# Patient Record
Sex: Female | Born: 1950 | Race: White | Hispanic: No | Smoking: Never smoker
Health system: Southern US, Community
[De-identification: ages and names within clinical notes are randomized; demographics above are authoritative.]

## PROBLEM LIST (undated history)

## (undated) DIAGNOSIS — K219 Gastro-esophageal reflux disease without esophagitis: Secondary | ICD-10-CM

## (undated) DIAGNOSIS — Z8489 Family history of other specified conditions: Secondary | ICD-10-CM

## (undated) DIAGNOSIS — Q211 Atrial septal defect, unspecified: Secondary | ICD-10-CM

## (undated) DIAGNOSIS — K7460 Unspecified cirrhosis of liver: Secondary | ICD-10-CM

## (undated) DIAGNOSIS — F32A Depression, unspecified: Secondary | ICD-10-CM

## (undated) DIAGNOSIS — I10 Essential (primary) hypertension: Secondary | ICD-10-CM

## (undated) DIAGNOSIS — G629 Polyneuropathy, unspecified: Secondary | ICD-10-CM

## (undated) DIAGNOSIS — F329 Major depressive disorder, single episode, unspecified: Secondary | ICD-10-CM

## (undated) DIAGNOSIS — E039 Hypothyroidism, unspecified: Secondary | ICD-10-CM

## (undated) DIAGNOSIS — Z960 Presence of urogenital implants: Secondary | ICD-10-CM

## (undated) DIAGNOSIS — R5383 Other fatigue: Secondary | ICD-10-CM

## (undated) DIAGNOSIS — E119 Type 2 diabetes mellitus without complications: Secondary | ICD-10-CM

## (undated) DIAGNOSIS — R51 Headache: Secondary | ICD-10-CM

## (undated) DIAGNOSIS — D649 Anemia, unspecified: Secondary | ICD-10-CM

## (undated) DIAGNOSIS — E785 Hyperlipidemia, unspecified: Secondary | ICD-10-CM

## (undated) DIAGNOSIS — R748 Abnormal levels of other serum enzymes: Secondary | ICD-10-CM

## (undated) DIAGNOSIS — H3500 Unspecified background retinopathy: Secondary | ICD-10-CM

## (undated) DIAGNOSIS — M8580 Other specified disorders of bone density and structure, unspecified site: Secondary | ICD-10-CM

## (undated) DIAGNOSIS — N189 Chronic kidney disease, unspecified: Secondary | ICD-10-CM

## (undated) HISTORY — DX: Unspecified background retinopathy: H35.00

## (undated) HISTORY — DX: Type 2 diabetes mellitus without complications: E11.9

## (undated) HISTORY — DX: Polyneuropathy, unspecified: G62.9

## (undated) HISTORY — DX: Anemia, unspecified: D64.9

## (undated) HISTORY — DX: Depression, unspecified: F32.A

## (undated) HISTORY — PX: SPINAL FUSION: SHX223

## (undated) HISTORY — DX: Hyperlipidemia, unspecified: E78.5

## (undated) HISTORY — DX: Other fatigue: R53.83

## (undated) HISTORY — DX: Gastro-esophageal reflux disease without esophagitis: K21.9

## (undated) HISTORY — PX: REFRACTIVE SURGERY: SHX103

## (undated) HISTORY — PX: APPENDECTOMY: SHX54

## (undated) HISTORY — PX: CATARACT EXTRACTION: SUR2

## (undated) HISTORY — PX: TONSILLECTOMY: SUR1361

## (undated) HISTORY — DX: Hypothyroidism, unspecified: E03.9

## (undated) HISTORY — DX: Major depressive disorder, single episode, unspecified: F32.9

---

## 1985-03-30 HISTORY — PX: COMBINED HYSTERECTOMY VAGINAL / OOPHORECTOMY / A&P REPAIR: SUR294

## 2000-02-20 ENCOUNTER — Ambulatory Visit (HOSPITAL_COMMUNITY): Admission: RE | Admit: 2000-02-20 | Discharge: 2000-02-20 | Payer: Self-pay | Admitting: *Deleted

## 2000-10-21 ENCOUNTER — Other Ambulatory Visit: Admission: RE | Admit: 2000-10-21 | Discharge: 2000-10-21 | Payer: Self-pay | Admitting: Internal Medicine

## 2004-03-14 ENCOUNTER — Ambulatory Visit (HOSPITAL_COMMUNITY): Admission: RE | Admit: 2004-03-14 | Discharge: 2004-03-14 | Payer: Self-pay

## 2004-03-15 ENCOUNTER — Inpatient Hospital Stay (HOSPITAL_COMMUNITY): Admission: EM | Admit: 2004-03-15 | Discharge: 2004-03-23 | Payer: Self-pay | Admitting: Emergency Medicine

## 2004-03-30 HISTORY — PX: CERVICAL SPINE SURGERY: SHX589

## 2004-04-18 ENCOUNTER — Ambulatory Visit: Admission: RE | Admit: 2004-04-18 | Discharge: 2004-04-18 | Payer: Self-pay | Admitting: Neurosurgery

## 2004-05-13 ENCOUNTER — Inpatient Hospital Stay (HOSPITAL_COMMUNITY): Admission: RE | Admit: 2004-05-13 | Discharge: 2004-05-15 | Payer: Self-pay | Admitting: Neurosurgery

## 2005-11-24 ENCOUNTER — Emergency Department (HOSPITAL_COMMUNITY): Admission: EM | Admit: 2005-11-24 | Discharge: 2005-11-24 | Payer: Self-pay | Admitting: Emergency Medicine

## 2008-02-21 ENCOUNTER — Ambulatory Visit: Payer: Self-pay | Admitting: Internal Medicine

## 2008-02-21 DIAGNOSIS — F3289 Other specified depressive episodes: Secondary | ICD-10-CM | POA: Insufficient documentation

## 2008-02-21 DIAGNOSIS — K219 Gastro-esophageal reflux disease without esophagitis: Secondary | ICD-10-CM | POA: Insufficient documentation

## 2008-02-21 DIAGNOSIS — G609 Hereditary and idiopathic neuropathy, unspecified: Secondary | ICD-10-CM | POA: Insufficient documentation

## 2008-02-21 DIAGNOSIS — F329 Major depressive disorder, single episode, unspecified: Secondary | ICD-10-CM | POA: Insufficient documentation

## 2008-02-21 DIAGNOSIS — Z961 Presence of intraocular lens: Secondary | ICD-10-CM | POA: Insufficient documentation

## 2008-02-22 ENCOUNTER — Encounter (INDEPENDENT_AMBULATORY_CARE_PROVIDER_SITE_OTHER): Payer: Self-pay | Admitting: Internal Medicine

## 2008-02-27 ENCOUNTER — Encounter (INDEPENDENT_AMBULATORY_CARE_PROVIDER_SITE_OTHER): Payer: Self-pay | Admitting: Internal Medicine

## 2008-02-28 ENCOUNTER — Telehealth (INDEPENDENT_AMBULATORY_CARE_PROVIDER_SITE_OTHER): Payer: Self-pay | Admitting: *Deleted

## 2008-03-14 ENCOUNTER — Encounter (INDEPENDENT_AMBULATORY_CARE_PROVIDER_SITE_OTHER): Payer: Self-pay | Admitting: Internal Medicine

## 2008-03-20 ENCOUNTER — Ambulatory Visit: Payer: Self-pay | Admitting: Internal Medicine

## 2008-03-20 DIAGNOSIS — E039 Hypothyroidism, unspecified: Secondary | ICD-10-CM | POA: Insufficient documentation

## 2008-03-20 DIAGNOSIS — N182 Chronic kidney disease, stage 2 (mild): Secondary | ICD-10-CM | POA: Insufficient documentation

## 2008-03-20 DIAGNOSIS — E785 Hyperlipidemia, unspecified: Secondary | ICD-10-CM | POA: Insufficient documentation

## 2008-05-01 ENCOUNTER — Ambulatory Visit: Payer: Self-pay | Admitting: Internal Medicine

## 2008-05-01 LAB — CONVERTED CEMR LAB
Blood Glucose, Fingerstick: 137
Hgb A1c MFr Bld: 10.5 %

## 2008-06-20 ENCOUNTER — Ambulatory Visit: Payer: Self-pay | Admitting: Internal Medicine

## 2008-06-26 ENCOUNTER — Encounter (INDEPENDENT_AMBULATORY_CARE_PROVIDER_SITE_OTHER): Payer: Self-pay | Admitting: Internal Medicine

## 2008-07-18 ENCOUNTER — Encounter (INDEPENDENT_AMBULATORY_CARE_PROVIDER_SITE_OTHER): Payer: Self-pay | Admitting: Internal Medicine

## 2008-07-19 ENCOUNTER — Encounter (INDEPENDENT_AMBULATORY_CARE_PROVIDER_SITE_OTHER): Payer: Self-pay | Admitting: Internal Medicine

## 2008-07-19 LAB — CONVERTED CEMR LAB
Ferritin: 44 ng/mL (ref 10–291)
Iron: 73 ug/dL (ref 42–145)
Saturation Ratios: 22 % (ref 20–55)
TIBC: 330 ug/dL (ref 250–470)
UIBC: 257 ug/dL
Vitamin B-12: 325 pg/mL (ref 211–911)

## 2008-07-23 LAB — CONVERTED CEMR LAB
ALT: 16 units/L (ref 0–35)
AST: 21 units/L (ref 0–37)
Albumin: 3.6 g/dL (ref 3.5–5.2)
Alkaline Phosphatase: 85 units/L (ref 39–117)
BUN: 44 mg/dL — ABNORMAL HIGH (ref 6–23)
Basophils Absolute: 0 10*3/uL (ref 0.0–0.1)
Basophils Relative: 1 % (ref 0–1)
CO2: 20 meq/L (ref 19–32)
Calcium: 8.9 mg/dL (ref 8.4–10.5)
Chloride: 109 meq/L (ref 96–112)
Cholesterol: 218 mg/dL — ABNORMAL HIGH (ref 0–200)
Creatinine, Ser: 2.13 mg/dL — ABNORMAL HIGH (ref 0.40–1.20)
Eosinophils Absolute: 0.2 10*3/uL (ref 0.0–0.7)
Eosinophils Relative: 5 % (ref 0–5)
Free T4: 1.33 ng/dL (ref 0.80–1.80)
Glucose, Bld: 102 mg/dL — ABNORMAL HIGH (ref 70–99)
HCT: 32.1 % — ABNORMAL LOW (ref 36.0–46.0)
HDL: 28 mg/dL — ABNORMAL LOW (ref >39)
Hemoglobin: 10 g/dL — ABNORMAL LOW (ref 12.0–15.0)
LDL Cholesterol: 141 mg/dL — ABNORMAL HIGH (ref 0–99)
Lymphocytes Relative: 22 % (ref 12–46)
Lymphs Abs: 1.1 10*3/uL (ref 0.7–4.0)
MCHC: 31.2 g/dL (ref 30.0–36.0)
MCV: 95.3 fL (ref 78.0–100.0)
Monocytes Absolute: 0.3 10*3/uL (ref 0.1–1.0)
Monocytes Relative: 7 % (ref 3–12)
Neutro Abs: 3.3 10*3/uL (ref 1.7–7.7)
Neutrophils Relative %: 66 % (ref 43–77)
Platelets: 176 10*3/uL (ref 150–400)
Potassium: 4.9 meq/L (ref 3.5–5.3)
RBC: 3.37 M/uL — ABNORMAL LOW (ref 3.87–5.11)
RDW: 15.7 % — ABNORMAL HIGH (ref 11.5–15.5)
Sodium: 141 meq/L (ref 135–145)
TSH: 4.171 microintl units/mL (ref 0.350–4.500)
Total Bilirubin: 0.3 mg/dL (ref 0.3–1.2)
Total CHOL/HDL Ratio: 7.8
Total Protein: 6.7 g/dL (ref 6.0–8.3)
Triglycerides: 244 mg/dL — ABNORMAL HIGH (ref <150)
VLDL: 49 mg/dL — ABNORMAL HIGH (ref 0–40)
WBC: 5 10*3/uL (ref 4.0–10.5)

## 2008-07-31 ENCOUNTER — Ambulatory Visit: Payer: Self-pay | Admitting: Internal Medicine

## 2008-07-31 LAB — CONVERTED CEMR LAB
Blood Glucose, Fingerstick: 143
Hgb A1c MFr Bld: 6.9 %

## 2008-08-01 ENCOUNTER — Encounter (INDEPENDENT_AMBULATORY_CARE_PROVIDER_SITE_OTHER): Payer: Self-pay | Admitting: Internal Medicine

## 2008-08-01 LAB — CONVERTED CEMR LAB
Albumin: 3.6 g/dL (ref 3.5–5.2)
BUN: 53 mg/dL — ABNORMAL HIGH (ref 6–23)
CO2: 20 meq/L (ref 19–32)
Calcium: 8.7 mg/dL (ref 8.4–10.5)
Chloride: 108 meq/L (ref 96–112)
Creatinine, Ser: 2.11 mg/dL — ABNORMAL HIGH (ref 0.40–1.20)
Creatinine, Urine: 103.7 mg/dL
Glucose, Bld: 98 mg/dL (ref 70–99)
Microalb Creat Ratio: 4.8 mg/g (ref 0.0–30.0)
Microalb, Ur: 0.5 mg/dL (ref 0.00–1.89)
Phosphorus: 3.8 mg/dL (ref 2.3–4.6)
Potassium: 5.3 meq/L (ref 3.5–5.3)
Sodium: 139 meq/L (ref 135–145)

## 2008-08-06 ENCOUNTER — Encounter (INDEPENDENT_AMBULATORY_CARE_PROVIDER_SITE_OTHER): Payer: Self-pay | Admitting: Internal Medicine

## 2008-11-13 ENCOUNTER — Ambulatory Visit: Payer: Self-pay | Admitting: Internal Medicine

## 2008-11-13 LAB — CONVERTED CEMR LAB: Hgb A1c MFr Bld: 5.8 %

## 2009-01-20 ENCOUNTER — Observation Stay (HOSPITAL_COMMUNITY): Admission: EM | Admit: 2009-01-20 | Discharge: 2009-01-21 | Payer: Self-pay | Admitting: Emergency Medicine

## 2009-01-20 ENCOUNTER — Ambulatory Visit: Payer: Self-pay | Admitting: Cardiology

## 2009-01-21 ENCOUNTER — Encounter (INDEPENDENT_AMBULATORY_CARE_PROVIDER_SITE_OTHER): Payer: Self-pay | Admitting: Internal Medicine

## 2009-06-18 ENCOUNTER — Encounter (INDEPENDENT_AMBULATORY_CARE_PROVIDER_SITE_OTHER): Payer: Self-pay | Admitting: *Deleted

## 2009-06-18 LAB — CONVERTED CEMR LAB
ALT: 15 units/L
AST: 15 units/L
Albumin: 3.7 g/dL
Alkaline Phosphatase: 132 units/L
BUN: 31 mg/dL
CO2: 22 meq/L
Calcium: 8.7 mg/dL
Chloride: 110 meq/L
Creatinine, Ser: 1.71 mg/dL
Glucose, Bld: 102 mg/dL
Potassium: 5.5 meq/L
Sodium: 139 meq/L
Total Protein: 6.6 g/dL

## 2010-01-20 ENCOUNTER — Encounter (INDEPENDENT_AMBULATORY_CARE_PROVIDER_SITE_OTHER): Payer: Self-pay | Admitting: *Deleted

## 2010-01-20 LAB — CONVERTED CEMR LAB
BUN: 31 mg/dL
CO2: 18 meq/L
Calcium: 9 mg/dL
Chloride: 107 meq/L
Creatinine, Ser: 1.95 mg/dL
GFR calc non Af Amer: 28 mL/min
Glomerular Filtration Rate, Af Am: 32 mL/min/{1.73_m2}
Glucose, Bld: 151 mg/dL
Potassium: 5.2 meq/L
Sodium: 140 meq/L

## 2010-02-04 ENCOUNTER — Encounter (INDEPENDENT_AMBULATORY_CARE_PROVIDER_SITE_OTHER): Payer: Self-pay | Admitting: *Deleted

## 2010-02-05 ENCOUNTER — Encounter (INDEPENDENT_AMBULATORY_CARE_PROVIDER_SITE_OTHER): Payer: Self-pay | Admitting: *Deleted

## 2010-02-05 ENCOUNTER — Ambulatory Visit: Payer: Self-pay | Admitting: Cardiology

## 2010-02-05 DIAGNOSIS — E1122 Type 2 diabetes mellitus with diabetic chronic kidney disease: Secondary | ICD-10-CM | POA: Insufficient documentation

## 2010-02-05 DIAGNOSIS — R079 Chest pain, unspecified: Secondary | ICD-10-CM | POA: Insufficient documentation

## 2010-02-06 ENCOUNTER — Encounter: Payer: Self-pay | Admitting: Cardiology

## 2010-02-12 ENCOUNTER — Ambulatory Visit: Payer: Self-pay | Admitting: Cardiology

## 2010-02-12 ENCOUNTER — Encounter: Payer: Self-pay | Admitting: Cardiology

## 2010-02-12 ENCOUNTER — Encounter (HOSPITAL_COMMUNITY)
Admission: RE | Admit: 2010-02-12 | Discharge: 2010-03-14 | Payer: Self-pay | Source: Home / Self Care | Attending: Cardiology | Admitting: Cardiology

## 2010-02-12 LAB — CONVERTED CEMR LAB
ALT: 22 units/L (ref 0–35)
AST: 22 units/L (ref 0–37)
Albumin: 3.9 g/dL (ref 3.5–5.2)
Alkaline Phosphatase: 175 units/L — ABNORMAL HIGH (ref 39–117)
BUN: 25 mg/dL — ABNORMAL HIGH (ref 6–23)
Basophils Absolute: 0 10*3/uL (ref 0.0–0.1)
Basophils Relative: 0 % (ref 0–1)
CO2: 22 meq/L (ref 19–32)
Calcium: 8.4 mg/dL (ref 8.4–10.5)
Chloride: 106 meq/L (ref 96–112)
Cholesterol: 154 mg/dL (ref 0–200)
Creatinine, Ser: 1.81 mg/dL — ABNORMAL HIGH (ref 0.40–1.20)
Eosinophils Absolute: 0.3 10*3/uL (ref 0.0–0.7)
Eosinophils Relative: 3 % (ref 0–5)
Glucose, Bld: 183 mg/dL — ABNORMAL HIGH (ref 70–99)
HCT: 38.7 % (ref 36.0–46.0)
HDL: 31 mg/dL — ABNORMAL LOW (ref >39)
Hemoglobin: 12.3 g/dL (ref 12.0–15.0)
LDL Cholesterol: 91 mg/dL (ref 0–99)
Lymphocytes Relative: 16 % (ref 12–46)
Lymphs Abs: 1.4 10*3/uL (ref 0.7–4.0)
MCHC: 31.8 g/dL (ref 30.0–36.0)
MCV: 92.6 fL (ref 78.0–100.0)
Monocytes Absolute: 0.5 10*3/uL (ref 0.1–1.0)
Monocytes Relative: 6 % (ref 3–12)
Neutro Abs: 6.6 10*3/uL (ref 1.7–7.7)
Neutrophils Relative %: 75 % (ref 43–77)
Platelets: 170 10*3/uL (ref 150–400)
Potassium: 4.3 meq/L (ref 3.5–5.3)
RBC: 4.18 M/uL (ref 3.87–5.11)
RDW: 13.8 % (ref 11.5–15.5)
Sodium: 139 meq/L (ref 135–145)
Total Bilirubin: 0.4 mg/dL (ref 0.3–1.2)
Total CHOL/HDL Ratio: 5
Total Protein: 6.9 g/dL (ref 6.0–8.3)
Triglycerides: 161 mg/dL — ABNORMAL HIGH (ref <150)
VLDL: 32 mg/dL (ref 0–40)
WBC: 8.9 10*3/uL (ref 4.0–10.5)

## 2010-02-25 ENCOUNTER — Ambulatory Visit: Payer: Self-pay | Admitting: Cardiology

## 2010-04-29 NOTE — Assessment & Plan Note (Signed)
Summary: F/U TESTS TO BE DONE 02/12/10/TG   Visit Type:  Follow-up Referring Provider:  Dr. Talmage Nap (Endocrine) Primary Provider:  None   History of Present Illness: [name] is a  60 y/o obese CF with complaints of chest discomfort and fatigue who was seen in the office by Dr. Dietrich Pates for cardiac evaluation on 02/05/2010.  She also has a history of diabetes and hypertension, along with hypothyroidism.  He assessed her and planned stress test and echocardiogram for diagnostic/prognostic purposes. He also increased her ramapril to 10mg  daily.  She is here to discuss the results.  She is continuing to have complaints of fatigue, and now cold symptoms.  No recurrence of chest pain.   Current Medications (verified): 1)  Glipizide 5 Mg Tabs (Glipizide) .... Take 1 Tab Two Times A Day 2)  Ramipril 10 Mg Caps (Ramipril) .... Take One Capsule By Mouth Daily 3)  Synthroid 100 Mcg Tabs (Levothyroxine Sodium) .... Take 1 Tab Daily 4)  Onglyza 2.5 Mg Tabs (Saxagliptin Hcl) .[name].. 1 By Mouth Once Daily 5)  Accu-Chek Multiclix Lancets  Misc (Lancets) .... Fsbs Once Daily 6)  Tylenol Extra Strength 500 Mg Tabs (Acetaminophen) .... As Needed 7)  Colace 100 Mg Caps (Docusate Sodium) .... Take As Needed  Allergies (verified): 1)  * Statins  Comments:  Nurse/Medical Assistant: patient and i reviewed med list from previous ov and stated that all meds are correct  Review of Systems       Fatigue, cold symptoms  All other systems have been reviewed and are negative unless stated above.   Vital Signs:  Patient profile:   60 year old female Weight:      255 pounds BMI:     41.00 Pulse rate:   98 / minute BP sitting:   143 / 82  (right arm)  Vitals Entered By: Dreama Saa, CNA (February 25, 2010 2:59 PM)  Physical Exam  General:  Well developed, well nourished, in no acute distress. Lungs:  Clear bilaterally to auscultation and percussion. Heart:  Non-displaced PMI, chest non-tender; regular  rate and rhythm, S1, S2 without murmurs, rubs or gallops. Carotid upstroke normal, no bruit. Normal abdominal aortic size, no bruits. Femorals normal pulses, no bruits. Pedals normal pulses. No edema, no varicosities. Psych:  Normal affect.   Impression & Recommendations:  Problem # 1:  CHEST PAIN (ICD-786.50) Review of stress myoview demonstrated negative for ischemia but impaired exercise tolerance and hypertensive response to exercise.  There was minor anterolater defect probably representing breast attenuation, but a small degree of scarring in this region cannot be unequivocally excluded. EF 51 %. No EKG abnormalities.  She continues complaints of fatigue.  I have asked about sleep apnea and snoring. 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  She states that she has been checked for sleep apnea although does not remember having a sleep study.  This may be considered as part of evaluation if necessary by primary care. She is given a list of primary care physicians to chose from. Will see on as needed basis should she have recurrent symptoms. At which time we will plan cardiac cath for definitive evaluation for CAD. updated medication list for this problem includes:    Ramipril 10 Mg Caps (Ramipril) .[name]... Take one capsule by mouth daily  Problem # 2:  HYPOTHYROIDISM (ICD-244.9) She has recently been increased on her dose of synthroid by Dr. Loney Hering.   Her updated medication list  for this problem includes:    Synthroid 100 Mcg Tabs (Levothyroxine sodium) .[name]... Take 1 tab daily

## 2010-04-29 NOTE — Letter (Signed)
Summary: [facility], general practice at [place]. [address] [place] [address]   Phone: [phone]  Fax: [phone]     February 05, 2010 MRN: [id number]   [name] [address] [place], [place]  [address]      YOUR LAB WORK IS DUE   February 10, 2010  Please go to [facility], located across the street from [facility] on the second floor.  Hours are Monday - Friday 7am until 7:30pm         Saturday 8am until 12noon    __  DO NOT EAT OR DRINK AFTER MIDNIGHT EVENING PRIOR TO LABWORK

## 2010-04-29 NOTE — Assessment & Plan Note (Signed)
Summary: np6/ CHESTPAIN. PT HAS [facility]/ GD   Visit Type:  Initial Consult Referring Provider:  Dr. Chalmers Cater (Endocrine) Primary Provider:  None   History of Present Illness: [name] is seen in the office today for an initial visit at the kind request of Dr. Chalmers Cater as the result of chest discomfort and fatigue.  [name] has been treated for hypothyroidism and is now apparently euthyroid or nearly euthyroid.  Nonetheless, she continues to complain of exercise intolerance and severe fatigue.    She has intermittent right chest heaviness, typically when she is driving an automobile.  There is no chest wall tenderness, but there may be a pleuritic component.  She denies associated nausea, diaphoresis and dyspnea.  Symptoms are generally brief.  She has not identified anything she can do to exacerbate or ameliorate her discomfort and wonders whether stress could be the problem;  however, she denies anxiety or any particular stressful events in her life.     She has occasional "heartburn", epigastric and parasternal discomfort that she treats effectively with Tums.  Cardiac catheterization in 2001 was entirely normal.  This followed an abnormal treadmill stress test.  An echocardiogram approximately a year ago raised the question of an ASD or PFO.  Current Medications (verified): 1)  Glipizide 5 Mg Tabs (Glipizide) .... Take 1 Tab Daily 2)  Ramipril 10 Mg Caps (Ramipril) .... Take One Capsule By Mouth Daily 3)  Synthroid 100 Mcg Tabs (Levothyroxine Sodium) .... Take 1 Tab Daily 4)  Onglyza 2.5 Mg Tabs (Saxagliptin Hcl) .[name].. 1 By Mouth Once Daily 5)  Accu-Chek Multiclix Lancets  Misc (Lancets) .... Fsbs Once Daily 6)  Tylenol Extra Strength 500 Mg Tabs (Acetaminophen) .... As Needed 7)  Colace 100 Mg Caps (Docusate Sodium) .... Take As Needed  Allergies (verified): 1)  * Statins  Comments:  Nurse/Medical Assistant: patient was taken off actos 2 weeks ago also needs a primary care  docter  Past History:  Family History: Last updated: Mar 05, 2010 father-deceased-70's-metastatic carcinoma of the lung mother-deceased-70's-COPD Siblings: 1 sister-53-DM; second sister is healthy Children-2 daughters and a son, who are healthy  Social History: Last updated: 03-05-2010 Divorced lives alone Employment-[employer] Never Smoked Alcohol use-no Drug use-no  Past Medical History: Chest pain and fatigue Diabetes mellitus, type II with retinopathy, peripheral neuropathy and nephropathy Hypothyroidism Hyperlipidemia Gastroesophageal reflux disease Cataracts Gout  Anemia Depression  Past Surgical History: Hysterectomy and a unilateral oophorectomy-1987-h/o uterine prolapse Cervical spine surgery-2006 Colonoscopy-never  EKG  Procedure date:  March 05, 2010  Findings:      Normal sinus rhythm Intermittent incomplete right bundle branch block Low voltage in the chest leads Minor nonspecific T wave abnormality No previous tracing for comparison.   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  All other systems reviewed and are negative.  Vital Signs:  Patient profile:   60 year old female Weight:      269 pounds BMI:     43.25 Pulse rate:   80 / minute BP sitting:   132 / 77  (right arm)  Vitals  Entered By: Doretha Sou, CNA (February 05, 2010 1:26 PM)  Physical Exam  General:  Overweight; well-developed; no acute  distress: HEENT-[place]/AT; PERRL; EOM intact; conjunctiva and lids nl:  Neck-No JVD; no carotid bruits: Endocrine-No thyromegaly: Lungs-No tachypnea, clear without rales, rhonchi or wheezes: CV-normal PMI; normal S1 and S2; S4 present; modest systolic ejection murmur Abdomen-BS normal; soft and non-tender without masses or organomegaly: MS-No deformities, cyanosis or clubbing: Neurologic-Nl cranial nerves; symmetric strength and tone: Skin- Warm, pallor, no sig. lesions: Extremities-Nl distal pulses; no edema    Impression & Recommendations:  Problem # 1:  CHEST PAIN (ICD-786.50) Chest discomfort is atypical, and cardiovascular risk factors are not impressive.  We will proceed with a stress nuclear cardiac study.  Recent echocardiogram was interpreted as possibly indicating the presence of an ASD.  This appears unlikely without other findings, such as right atrial or right ventricular enlargement.  Nonetheless, we will proceed with a contrast echocardiogram in an attempt to clarify this issue.  Fatigue is nonspecific and would most appropriately be evaluated by her new primary care physician, once one is selected.  Problem # 2:  HYPERTENSION (ICD-401.1) Blood pressure control is marginal for a patient with diabetes.  Moreover, she has significantly impaired renal function and will likely benefit from treatment with an adequate dose of ACE inhibitor or ARB.  Ramipril will be increased to 10 mg q.d. and renal function followed.  Other Orders: Nuclear Stress Test (Nuc Stress Test) 2-D Echocardiogram (2D Echo) Future Orders: T-Lipid Profile ([id number]) ... 02/10/2010 T-CBC w/Diff ([id number]) ... 02/10/2010 T-Comprehensive Metabolic Panel ([id number]) ... 02/10/2010  Patient Instructions: 1)  Your physician recommends that you schedule a follow-up appointment in: AFTER TESTING 2)  Your physician has recommended you make the following change in your medication: INCREASE ALTACE TO 10MG  DAILY 3)  Your physician has requested that you have an echocardiogram.  Echocardiography is a painless test that uses sound waves to create images of your heart. It provides your doctor with information about the size and shape of your heart and how well your heart's chambers and valves are working.  This procedure takes approximately one hour. There are no restrictions for this procedure. 4)  Your physician has requested that you have an exercise stress myoview.  For further information please visit [email or web address].  Please follow instruction sheet, as given. Prescriptions: RAMIPRIL 10 MG CAPS (RAMIPRIL) Take one capsule by mouth daily  #30 x 3   Entered by:   Tye Savoy RN   Authorized by:   Yehuda Savannah, MD, [facility]   Signed by:   Tye Savoy RN on 02/05/2010   Method used:   Electronically to        [facility] (retail)       [facility] [address]       [place], [place]  [address]       Ph: [phone]       Fax: [phone]   RxID:   [id number]

## 2010-04-29 NOTE — Miscellaneous (Signed)
Summary: labs bmp,tsh,01/20/2010  Clinical Lists Changes  Observations: Added new observation of CALCIUM: 9.0 mg/dL (01/20/2010 10:06) Added new observation of GFR AA: 32 mL/min/1.35m (01/20/2010 10:06) Added new observation of GFR: 28 mL/min (01/20/2010 10:06) Added new observation of CREATININE: 1.95 mg/dL (01/20/2010 10:06) Added new observation of BUN: 31 mg/dL (01/20/2010 10:06) Added new observation of BG RANDOM: 151 mg/dL (01/20/2010 10:06) Added new observation of CO2 PLSM/SER: 18 meq/L (01/20/2010 10:06) Added new observation of CL SERUM: 107 meq/L (01/20/2010 10:06) Added new observation of K SERUM: 5.2 meq/L (01/20/2010 10:06) Added new observation of NA: 140 meq/L (01/20/2010 10:06)

## 2010-04-29 NOTE — Miscellaneous (Signed)
Summary: labs cmp,06/18/2009  Clinical Lists Changes  Observations: Added new observation of CALCIUM: 8.7 mg/dL (04/54/0981 19:14) Added new observation of ALBUMIN: 3.7 g/dL (78/29/5621 30:86) Added new observation of PROTEIN, TOT: 6.6 g/dL (57/84/6962 95:28) Added new observation of SGPT (ALT): 15 units/L (06/18/2009 10:08) Added new observation of SGOT (AST): 15 units/L (06/18/2009 10:08) Added new observation of ALK PHOS: 132 units/L (06/18/2009 10:08) Added new observation of CREATININE: 1.71 mg/dL (41/32/4401 02:72) Added new observation of BUN: 31 mg/dL (53/66/4403 47:42) Added new observation of BG RANDOM: 102 mg/dL (59/56/3875 64:33) Added new observation of CO2 PLSM/SER: 22 meq/L (06/18/2009 10:08) Added new observation of CL SERUM: 110 meq/L (06/18/2009 10:08) Added new observation of K SERUM: 5.5 meq/L (06/18/2009 10:08) Added new observation of NA: 139 meq/L (06/18/2009 10:08)

## 2010-04-29 NOTE — Letter (Signed)
Summary: [facility] ([place])  [facility] at [place]. [address] [place] [address]   Phone: [phone]  Fax: [phone]    Nuclear Medicine 1-Day Stress Test Information Sheet  Re:     [name]   DOB:     [birth year] MRN:     [id number] Weight:  Appointment Date: Register at: Appointment Time: Referring MD:  _X__Exercise Stress  __Adenosine   __Dobutamine  __Lexiscan  __Persantine   __Thallium  Urgency: ____1 (next day)   ____2 (one week)    ____3 (PRN)  Patient will receive Follow Up call with results: Patient needs follow-up appointment:  Instructions regarding medication:  How to prepare for your stress test: 1. DO NOT eat or dring 6 hours prior to your arrival time. This includes no caffeine (coffee, tea, sodas, chocolate) if you were instructed to take your medications, drink water with it. 2. DO NOT use any tobacco products for at leaset 8 hours prior to arrival. 3. DO NOT wear dresses or any clothing that may have metal clasps or buttons. 4. Wear short sleeve shirts, loose clothing, and comfortalbe walking shoes. 5. DO NOT use lotions, oils or powder on your chest before the test. 6. The test will take approximately 3-4 hours from the time you arrive until completion. 7. To register the day of the test, go to the Short Stay entrance at [facility]. 8. If you must cancel your test, call [phone] as soon as you are aware. 9.DO NOT TAKE GLIPIZIDE AND RAMIPRIL THE MORNING BEFORE THE TEST After you arrive for test:   When you arrive at [facility], you will go to Short Stay to be registered. They will then send you to Radiology to check in. The Nuclear Medicine Tech will get you and start an IV in your arm or hand. A small amount of a radioactive tracer will then be injected into your IV. This tracer will then have to circulate for 30-45 minutes. During this time you will wait in the waiting room and you will be able to drink  something without caffeine. A series of pictures will be taken of your heart follwoing this waiting period. After the 1st set of pictures you will go to the stress lab to get ready for your stress test. During the stress test, another small amount of a radioactive tracer will be injected through your IV. When the stress test is complete, there is a short rest period while your heart rate and blood pressure will be monitored. When this monitoring period is complete you will have another set of pictrues taken. (The same as the 1st set of pictures). These pictures are taken between 15 minutes and 1 hour after the stress test. The time depends on the type of stress test you had. Your doctor will inform you of your test results within 7 days after test.    The possibilities of certain changes are possible during the test. They include abnormal blood pressure and disorders of the heart. Side effects of persantine or adenosine can include flushing, chest pain, shortness of breath, stomach tightness, headache and light-headedness. These side effects usually do not last long and are self-resolving. Every effort will be made to keep you comfortable and to minimize complications by obtaining a medical history and by close observation during the test. Emergency equipment, medications, and trained personnel are available to deal with any unusual situation which may arise.  Please notify office at least  48 hours in advance if you are unable to keep this appt.

## 2010-05-06 ENCOUNTER — Other Ambulatory Visit: Payer: Self-pay | Admitting: Internal Medicine

## 2010-05-06 DIAGNOSIS — R1011 Right upper quadrant pain: Secondary | ICD-10-CM

## 2010-05-06 DIAGNOSIS — R198 Other specified symptoms and signs involving the digestive system and abdomen: Secondary | ICD-10-CM

## 2010-05-08 ENCOUNTER — Other Ambulatory Visit: Payer: Self-pay

## 2010-05-12 ENCOUNTER — Ambulatory Visit
Admission: RE | Admit: 2010-05-12 | Discharge: 2010-05-12 | Disposition: A | Discharge status: Home / Self Care | Payer: 59 | Source: Ambulatory Visit | Attending: Internal Medicine | Admitting: Internal Medicine

## 2010-05-12 DIAGNOSIS — R1011 Right upper quadrant pain: Secondary | ICD-10-CM

## 2010-05-12 DIAGNOSIS — R198 Other specified symptoms and signs involving the digestive system and abdomen: Secondary | ICD-10-CM

## 2010-07-03 LAB — GLUCOSE, CAPILLARY
Glucose-Capillary: 146 mg/dL — ABNORMAL HIGH (ref 70–99)
Glucose-Capillary: 161 mg/dL — ABNORMAL HIGH (ref 70–99)
Glucose-Capillary: 241 mg/dL — ABNORMAL HIGH (ref 70–99)
Glucose-Capillary: 85 mg/dL (ref 70–99)
Glucose-Capillary: 90 mg/dL (ref 70–99)
Glucose-Capillary: 98 mg/dL (ref 70–99)

## 2010-07-03 LAB — URINALYSIS, ROUTINE W REFLEX MICROSCOPIC
Glucose, UA: NEGATIVE mg/dL
Hgb urine dipstick: NEGATIVE
Protein, ur: NEGATIVE mg/dL
Specific Gravity, Urine: 1.02 (ref 1.005–1.030)
pH: 6 (ref 5.0–8.0)

## 2010-07-03 LAB — COMPREHENSIVE METABOLIC PANEL
ALT: 16 U/L (ref 0–35)
AST: 22 U/L (ref 0–37)
CO2: 26 mEq/L (ref 19–32)
Calcium: 8.2 mg/dL — ABNORMAL LOW (ref 8.4–10.5)
Calcium: 8.6 mg/dL (ref 8.4–10.5)
Creatinine, Ser: 1.99 mg/dL — ABNORMAL HIGH (ref 0.4–1.2)
Creatinine, Ser: 2.03 mg/dL — ABNORMAL HIGH (ref 0.4–1.2)
GFR calc Af Amer: 31 mL/min — ABNORMAL LOW (ref >60)
GFR calc Af Amer: 31 mL/min — ABNORMAL LOW (ref >60)
GFR calc non Af Amer: 25 mL/min — ABNORMAL LOW (ref >60)
GFR calc non Af Amer: 26 mL/min — ABNORMAL LOW (ref >60)
Glucose, Bld: 82 mg/dL (ref 70–99)
Sodium: 137 mEq/L (ref 135–145)
Total Protein: 5.8 g/dL — ABNORMAL LOW (ref 6.0–8.3)

## 2010-07-03 LAB — CBC
HCT: 31.8 % — ABNORMAL LOW (ref 36.0–46.0)
Hemoglobin: 10.9 g/dL — ABNORMAL LOW (ref 12.0–15.0)
Hemoglobin: 9.5 g/dL — ABNORMAL LOW (ref 12.0–15.0)
MCHC: 34.1 g/dL (ref 30.0–36.0)
MCHC: 34.4 g/dL (ref 30.0–36.0)
MCV: 91.7 fL (ref 78.0–100.0)
MCV: 91.7 fL (ref 78.0–100.0)
Platelets: 141 10*3/uL — ABNORMAL LOW (ref 150–400)
RBC: 3.47 MIL/uL — ABNORMAL LOW (ref 3.87–5.11)
RDW: 13.7 % (ref 11.5–15.5)
RDW: 13.8 % (ref 11.5–15.5)
WBC: 10.3 10*3/uL (ref 4.0–10.5)

## 2010-07-03 LAB — POCT I-STAT, CHEM 8
Chloride: 111 mEq/L (ref 96–112)
Creatinine, Ser: 1.8 mg/dL — ABNORMAL HIGH (ref 0.4–1.2)
Glucose, Bld: 232 mg/dL — ABNORMAL HIGH (ref 70–99)
Potassium: 5 mEq/L (ref 3.5–5.1)

## 2010-07-03 LAB — TSH
TSH: 2.154 u[IU]/mL (ref 0.350–4.500)
TSH: 2.749 u[IU]/mL (ref 0.350–4.500)

## 2010-07-03 LAB — IRON AND TIBC
Saturation Ratios: 14 % — ABNORMAL LOW (ref 20–55)
UIBC: 242 ug/dL

## 2010-07-03 LAB — DIFFERENTIAL
Basophils Absolute: 0 10*3/uL (ref 0.0–0.1)
Basophils Relative: 0 % (ref 0–1)
Eosinophils Absolute: 0.1 10*3/uL (ref 0.0–0.7)
Eosinophils Absolute: 0.2 10*3/uL (ref 0.0–0.7)
Eosinophils Relative: 1 % (ref 0–5)
Lymphocytes Relative: 26 % (ref 12–46)
Lymphocytes Relative: 6 % — ABNORMAL LOW (ref 12–46)
Lymphs Abs: 0.6 10*3/uL — ABNORMAL LOW (ref 0.7–4.0)
Lymphs Abs: 1.3 10*3/uL (ref 0.7–4.0)
Monocytes Absolute: 0.5 10*3/uL (ref 0.1–1.0)
Monocytes Relative: 5 % (ref 3–12)
Monocytes Relative: 7 % (ref 3–12)
Neutro Abs: 9.1 10*3/uL — ABNORMAL HIGH (ref 1.7–7.7)
Neutrophils Relative %: 63 % (ref 43–77)
Neutrophils Relative %: 88 % — ABNORMAL HIGH (ref 43–77)

## 2010-07-03 LAB — URINE MICROSCOPIC-ADD ON

## 2010-07-03 LAB — VITAMIN B12: Vitamin B-12: 339 pg/mL (ref 211–911)

## 2010-07-03 LAB — CARDIAC PANEL(CRET KIN+CKTOT+MB+TROPI)
CK, MB: 1.1 ng/mL (ref 0.3–4.0)
CK, MB: 1.4 ng/mL (ref 0.3–4.0)
Relative Index: INVALID (ref 0.0–2.5)
Relative Index: INVALID (ref 0.0–2.5)
Total CK: 79 U/L (ref 7–177)
Total CK: 93 U/L (ref 7–177)

## 2010-07-03 LAB — LIPID PANEL
Cholesterol: 182 mg/dL (ref 0–200)
LDL Cholesterol: 129 mg/dL — ABNORMAL HIGH (ref 0–99)
Triglycerides: 141 mg/dL (ref <150)

## 2010-07-03 LAB — RETICULOCYTES: Retic Ct Pct: 1.9 % (ref 0.4–3.1)

## 2010-07-03 LAB — T4, FREE: Free T4: 1.29 ng/dL (ref 0.80–1.80)

## 2010-07-03 LAB — HEMOGLOBIN A1C
Hgb A1c MFr Bld: 6 % (ref 4.6–6.1)
Mean Plasma Glucose: 126 mg/dL

## 2010-07-03 LAB — BRAIN NATRIURETIC PEPTIDE: Pro B Natriuretic peptide (BNP): 85 pg/mL (ref 0.0–100.0)

## 2010-08-15 NOTE — Discharge Summary (Signed)
NAMEAUSTYN, [name]                 ACCOUNT NO.:  [id number]   MEDICAL RECORD NO.:  [id number]          PATIENT TYPE:  INP   LOCATION:  5707                         FACILITY:  [facility]   PHYSICIAN:  Melissa Montane, M.D.       DATE OF BIRTH:  04-03-1950   DATE OF ADMISSION:  03/20/2004  DATE OF DISCHARGE:  03/23/2004                                 DISCHARGE SUMMARY   ADMISSION DIAGNOSIS:  Right facial cellulitis and possible abscess.   DISCHARGE DIAGNOSIS:  Right facial cellulitis and possible abscess.   OPERATIVE PROCEDURE:  Incision and drainage of right facial abscess.   HOSPITAL COURSE:  This is a 60 year old who had right facial cellulitis and  abscess which was treated at [facility] with Unasyn.  She was  changed to vancomycin the night before admission.  The swelling was  improved, but because she is a diabetic and continued to have a fairly  significant amount of erythema, she was transferred to [facility] for  closer observation.  She was placed on vancomycin.  She was improving  quickly on vancomycin and the incision and drainage was cancelled on  December 23 but then she had some persistent fluctuant area on the right  side which then had incision and drainage performed and cultures sent.  After that, she was doing so well and felt like she wanted to go home that  she was discharged to home.  She was discharged on Doxycycline 100 mg b.i.d.  and to follow up in the office on Tuesday to be checked and have the packing  changed.  She is to call sooner if she has increased swelling, erythema,  fever, or pain.      JB/MEDQ  D:  07/02/2004  T:  07/02/2004  Job:  [id number]

## 2010-08-15 NOTE — Group Therapy Note (Signed)
[name]                 ACCOUNT NO.:  [id number]   MEDICAL RECORD NO.:  [id number]          PATIENT TYPE:  INP   LOCATION:  A307                          FACILITY:  [facility]   PHYSICIAN:  Edward L. Luan Pulling, M.D.DATE OF BIRTH:  1950-05-03   DATE OF PROCEDURE:  DATE OF DISCHARGE:                                   PROGRESS NOTE   [name] is about the same.  She has had some spontaneous drainage of the  abscess.   I have discussed her situation with Dr. Janace Hoard, and he feels fairly strongly  that this is a MRSA.  She is now on vancomycin.  She is going to be  transferred to [facility] for surgery and drainage of this area.   Exam shows the temp is 98.2, pulse 68, respirations 20.  Blood sugar 201.  Blood pressure 128/70.  Her face looks better.   Assessment is that she is better but clearly still with problems.   PLAN:  Continue treatments and follow.     Edwa   ELH/MEDQ  D:  03/20/2004  T:  03/20/2004  Job:  [id number]

## 2010-08-15 NOTE — Discharge Summary (Signed)
NAMEGRATIA, [name]                 ACCOUNT NO.:  [id number]   MEDICAL RECORD NO.:  [id number]          PATIENT TYPE:  INP   LOCATION:  A307                          FACILITY:  [facility]   PHYSICIAN:  Edward L. Luan Pulling, M.D.DATE OF BIRTH:  1951-02-07   DATE OF ADMISSION:  03/15/2004  DATE OF DISCHARGE:  12/22/2005LH                                 DISCHARGE SUMMARY   FINAL DIAGNOSES:  1.  Abscess of the face.  2.  Diabetes mellitus.  3.  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   HISTORY:  [name] is a 60 year old who was admitted because of swelling of  her face. She had called my office about two days prior to admission. At  that point, there was significant icing on the roads, and she was not able  to come to the office, but she said that she had a swelling of her face. I  felt that this was probably related to a dental abscess of some kind, and I  asked her to start amoxicillin. She did this but continued to have swelling,  and it did not seem to improve. She eventually came to the emergency room  for further evaluation because her face continued to swell, and when she was  seen in the ER, she had what was fairly clear a large abscess in the right  side of the face. Her exam otherwise showed that she had some decreased  strength and decreased reflexes in both arms, right greater than left. She  has been having some problems with her neck anyway and has been felt to have  what may be a cervical spine disease. She has had a recent MRI to evaluate  this. At any rate, because of the findings on her exam, she was admitted to  the hospital and started on Unasyn. Consultation was requested and obtained  with the ENT team, and it was felt that she should probably have drainage of  this area. It was also felt that she should be switched to vancomycin  because of the potential that this represented MRSA. She was transferred to  [facility] to the service of Dr. Janace Hoard for probable surgery.     Edwa   ELH/MEDQ  D:  03/20/2004  T:  03/21/2004  Job:  [id number]

## 2010-08-15 NOTE — Group Therapy Note (Signed)
[name], [name]                 ACCOUNT NO.:  [id number]   MEDICAL RECORD NO.:  [id number]          PATIENT TYPE:  INP   LOCATION:  A307                          FACILITY:  [facility]   PHYSICIAN:  Edward L. Luan Pulling, M.D.DATE OF BIRTH:  [birth year]   DATE OF PROCEDURE:  03/18/2004  DATE OF DISCHARGE:                                   PROGRESS NOTE   PROBLEM:  1.  Abscess of the face.  2.  Diabetes.   SUBJECTIVE:  [name] says she is better today.  She has no new complaints.   PHYSICAL EXAMINATION:  VITAL SIGNS:  Temperature 97.6, pulse 84,  respirations 20, blood sugar 242, blood pressure 129/84, O2 saturation 92%  on room air.  CHEST:  Clear.  HEENT:  Her face looks about the same.  She still has some swelling.   ASSESSMENT:  She has a facial abscess.  We are awaiting consultation from  the ENT team and then decide what to do from there.  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     Edwa   ELH/MEDQ  D:  03/18/2004  T:  03/18/2004  Job:  [id number]

## 2010-08-15 NOTE — Group Therapy Note (Signed)
NAMEODESTER, NILSON                 ACCOUNT NO.:  [id number]   MEDICAL RECORD NO.:  [id number]          PATIENT TYPE:  INP   LOCATION:  A307                          FACILITY:  [facility]   PHYSICIAN:  Edward L. Luan Pulling, M.D.DATE OF BIRTH:  [birth year]   DATE OF PROCEDURE:  03/17/2004  DATE OF DISCHARGE:                                   PROGRESS NOTE   PROBLEM:  Facial abscess.   SUBJECTIVE:  Ms. Sheu says she is feeling a little better. Her face is  still somewhat painful.   PHYSICAL EXAMINATION:  VITAL SIGNS:  Her exam shows a temperature of 98.8,  pulse 83, respirations 16, blood sugar 195. It has been as high as 261.  Blood pressure 127/77. O2 saturation is 92% on room air.   ASSESSMENT:  She has a facial abscess. She has diabetes which is better but  not totally controlled as of yet. She also has what is probably a cervical  spine disk.   PLAN:  My plan is to continue with her treatment for waiting dental and ENT  consultation.     Edwa   ELH/MEDQ  D:  03/17/2004  T:  03/18/2004  Job:  [id number]

## 2010-08-15 NOTE — Cardiovascular Report (Signed)
[place]  Patient:    [name], SHRIEVES                          MRN: [id number] Proc. Date: 02/20/00 Adm. Date:  [id number] Attending:  Christy Sartorius CC:         Marijo Conception. Verl Blalock, M.D. [facility]  Sinda Du, M.D., [place], [place], [facility]   Cardiac Catheterization  PROCEDURE PERFORMED:  Left heart catheterization with coronary angiography and left ventriculography.  INDICATIONS:  Recurrent chest pain with positive exercise treadmill test in a diabetic, obese patient.    I. The patient is a 60 year old woman referred by Dr. Velvet Bathe for      evaluation of chest pain.  The patient had previously undergone an      exercise stress test with demonstrated ST segment depression.  She has      diabetes and obesity and was subsequently referred for left heart      catheterization.   II. DESCRIPTION OF PROCEDURE:  After informed consent was obtained, the      patient was taken to the diagnostic catheterization lab in a fasting      state.  After the usual preparation and draping, intravenous midazolam      was given for sedation.  Lidocaine, 20 cc, was infiltrated into the right      femoral region, and the right femoral artery was punctured, and a J wire      was advanced and by way of this J wire, a 7-French hemostatic sheath.      The left Judkins catheter was inserted by way of the hemostatic sheath      and advanced into the left main coronary artery.  A coronary angiography      of the left main coronary artery was then carried out.  The left Judkins      catheter was removed, and the right Judkins catheter was inserted through      the sheath and advanced into the right coronary artery, and a      coronary angiography of the right coronary system was carried out.       Following this, the right coronary catheter was removed, and the angled      pigtail catheter was inserted retrograde across the aortic valve.  A left  ventriculography in the RAO projection was then carried out.  Following      this, the catheters were removed.  Hemostasis was assured, and the      patient was returned to her room in good condition.  III. COMPLICATIONS:  There were no immediate procedural complications.   IV. RESULTS      A. Hemodynamics:  Left ventricular pressure was 127/12.  The pullback         pressure was 106/10 going to 105/70.      B. Left ventriculography:  During the left ventriculogram, there was         dense ventricular ectopy.  The post PVC beat, however, demonstrated         supranormal left ventricular systolic function.      C. Coronary angiography:  The left coronary artery was the dominant         vessel giving rise to a left-sided PDA which originated from the         terminal portion of the left circumflex.  The left main coronary  artery was angiographically normal.  The left anterior descending         artery gave off two diagonal branches and was angiographically normal.         The left circumflex artery was the dominant vessel.  It gave rise to         three obtuse marginal branches along with a terminal PDA, all of which         were angiographically normal with the exception of minimal luminal         irregularities.  The right coronary artery was a very small,         diminutive vessel supplying two posterolateral branches.    V. CONCLUSIONS:  The study demonstrates no obstructive coronary artery       disease.  The circulation was left-dominant.  The LV function was       preserved. DD:  02/20/00 TD:  02/20/00 Job: [id number]

## 2010-08-15 NOTE — H&P (Signed)
**Note Brandy via Obfuscation** Haynes, [name]                 ACCOUNT NO.:  [id number]   MEDICAL RECORD NO.:  [id number]          PATIENT TYPE:  INP   LOCATION:  A307                          FACILITY:  [facility]   PHYSICIAN:  Paula Compton. Willey Blade, MD       DATE OF BIRTH:  November 17, 1950   DATE OF ADMISSION:  03/15/2004  DATE OF DISCHARGE:  LH                                HISTORY & PHYSICAL   CHIEF COMPLAINT:  Right facial swelling.   HISTORY OF PRESENT ILLNESS:  This patient is a 60 year old white female who  presented to the emergency room with a five-day history of swelling and  redness on the right side of the face.  She was started on amoxicillin two  days ago as an outpatient.  She has had progressive swelling but denies any  drainage of any pus.  She has not had any dental pain.  She did have a root  canal in October.  There has been no trauma to the face.  She has a history  of type 2 diabetes.  She was evaluated in the emergency room.  A CT scan was  obtained which revealed no abscess in the area of concern.   PAST MEDICAL HISTORY:  1.  Type 2 diabetes.  2.  Vaginal hysterectomy.  3.  Recent back pain.   MEDICATIONS:  1.  Altace 5 mg q.d.  2.  Glucophage 500 mg b.i.d.  3.  Starlix t.i.d.   ALLERGIES:  None.   SOCIAL HISTORY:  She does not smoke cigarettes or drink alcohol.   REVIEW OF SYSTEMS:  Noncontributory.   PHYSICAL EXAMINATION:  VITAL SIGNS:  Temperature 97.6, blood pressure  119/75, pulse 94, respirations 18.  GENERAL:  An alert, oriented female.  HEENT:  The right lip and right cheek are red, swollen, and indurated.  She  originally had a small pimple-like lesion on the right lip.  This area was  unroofed, but no pus was expressed.  There is no fluctuance present.  She  has normal extraocular eye movements.  There is no nasal drainage.  NECK:  There is no cervical lymphadenopathy.  No thyromegaly.  LUNGS:  Clear.  HEART:  Regular with no murmurs.  ABDOMEN:  Nontender with no  hepatosplenomegaly.  EXTREMITIES:  No calf tenderness.  Normal pulses.  No clubbing or edema.  NEUROLOGIC:  Intact.   LABORATORY DATA:  White count 17.5, hemoglobin 14.8, platelets 288, 86 segs,  10 lymphs.  Sodium 131, potassium 4.3, bicarb 24, glucose 378, BUN 12,  creatinine 1.1, calcium 9.3, albumin 3.6, SGOT 18.   IMPRESSION/PLAN:  1.  Right facial cellulitis:  She was given 1 gm of Rocephin in the      emergency room.  She will be started on Unasyn 3 gm IV q.6h.  2.  Diabetes:  Will follow q.a.c. and q.h.s. Accu-Cheks and add sliding-      scale Novolog.     [name]   ROF/MEDQ  D:  03/15/2004  T:  03/15/2004  Job:  [id number]

## 2010-08-15 NOTE — Group Therapy Note (Signed)
NAMEJERI, [name]                 ACCOUNT NO.:  [id number]   MEDICAL RECORD NO.:  [id number]          PATIENT TYPE:  INP   LOCATION:  A307                          FACILITY:  [facility]   PHYSICIAN:  Edward L. Luan Pulling, M.D.DATE OF BIRTH:  March 12, 1951   DATE OF PROCEDURE:  DATE OF DISCHARGE:                                   PROGRESS NOTE   PROBLEM:  Facial abscess, diabetes.   SUBJECTIVE:  [name] says she is better.  She does not have as much  discomfort.  Her face is less swollen than before, and she has had some  drainage which is of a serous-looking fluid.   Her exam showed that her temp is 97.4, pulse 77, respirations 18.  Blood  sugar 212.  Blood pressure 128/80.  Her chest is fairly clear.  Her heart is regular.  Her abdomen is soft.   ASSESSMENT:  She is better.   PLAN:  We are going to get the ENT physicians to take a look at her and see  if there is anything else we need to do.  I am going to see about trying to  switch her to taking some of her medications by mouth, and I will probably  switch her antibiotics tomorrow.  No other new treatments right now.     Edwa   ELH/MEDQ  D:  03/19/2004  T:  03/19/2004  Job:  [id number]

## 2010-08-15 NOTE — Op Note (Signed)
[name], RONK                 ACCOUNT NO.:  [id number]   MEDICAL RECORD NO.:  [id number]          PATIENT TYPE:  INP   LOCATION:  3172                         FACILITY:  [facility]   PHYSICIAN:  Leeroy Cha, M.D.   DATE OF BIRTH:  May 04, 1950   DATE OF PROCEDURE:  05/13/2004  DATE OF DISCHARGE:                                 OPERATIVE REPORT   PREOPERATIVE DIAGNOSIS:  C4-C5, C5-C6, C6-C7 stenosis with chronic  radiculopathy.   POSTOPERATIVE DIAGNOSIS:  C4-C5, C5-C6, C6-C7 stenosis with chronic  radiculopathy.   PROCEDURE:  Anterior C4-C5, C5-C6, C6-C7 diskectomy, decompression of the  spinal cord, bilateral foraminotomy, interbody fusion with allograft plate  from C4 to C7, microscope.   SURGEON:  Leeroy Cha, M.D.   ASSISTANT:  Ashok Pall, M.D.   CLINICAL HISTORY:  The patient was admitted because of neck pain with  radiation to both upper extremities associated with weakness of the deltoid,  biceps, and triceps.  X-ray showed stenosis at the level of C4-C5, C5-C6, C6-  C7.  There was a question of calcification of the posterior ligament.  Surgery was advised including the possibility of corpectomy.  The risks were  explained to her in the history and physical and also the risk associated  with her diabetes.   PROCEDURE:  The patient was taken to the OR and after intubation, the left  side of the neck was prepped with Betadine.  A longitudinal incision through  the skin and platysma was carried out.  X-rays showed that, indeed, we were  at the level of 5-6.  Then, we opened the anterior ligament of 4-5, 5-6, and  6-7.  With the microcuret, we did a total gross diskectomy.  We brought the  microscope into the area.  We drilled the prominent spondylosis and we were  able to open up the foramina bilaterally at 4-5.  The posterior ligament was  also incised in the midline and it was not calcified but it was a little bit  fibrotic.  Decompression of the spinal cord as  well as the C5 nerve root was  accomplished.  The same procedure was done at the level of 5-6 and the C6  nerve root was more compromised than the last one.  Good decompression was  achieved.  The same procedure was achieved at the level of C6-C7.  From then  on, we drilled the endplate of 4, 5, 6, and 7.  An allograft of 6 mm between  4-5 and 7 mm between 5-6 and 6-7, was done.  Having good decompression and  good position of the bone graft, we went ahead with the plate.  This plate  went all the way from C4 down to C7.  A #8 screw was inserted.  Lateral C-  spine showed good position of the bone graft and the plate including  correction of the normal lordosis.  The patient was kyphotic prior to  surgery.  From then on, the area was irrigated, hemostasis was done with  bipolar.  A Jackson-Pratt drain was left in the precervical area and the  wound was closed with Vicryl and Steri-Strips.      EB/MEDQ  D:  05/13/2004  T:  05/13/2004  Job:  [id number]

## 2010-09-18 ENCOUNTER — Other Ambulatory Visit (HOSPITAL_COMMUNITY): Payer: Self-pay | Admitting: Internal Medicine

## 2010-09-18 DIAGNOSIS — R102 Pelvic and perineal pain: Secondary | ICD-10-CM

## 2010-09-25 ENCOUNTER — Encounter (HOSPITAL_COMMUNITY): Payer: Self-pay

## 2010-09-25 ENCOUNTER — Encounter (HOSPITAL_COMMUNITY)
Admission: RE | Admit: 2010-09-25 | Discharge: 2010-09-25 | Disposition: A | Discharge status: Home / Self Care | Payer: 59 | Source: Ambulatory Visit | Attending: Internal Medicine | Admitting: Internal Medicine

## 2010-09-25 DIAGNOSIS — N949 Unspecified condition associated with female genital organs and menstrual cycle: Secondary | ICD-10-CM | POA: Insufficient documentation

## 2010-09-25 DIAGNOSIS — R748 Abnormal levels of other serum enzymes: Secondary | ICD-10-CM | POA: Insufficient documentation

## 2010-09-25 DIAGNOSIS — R102 Pelvic and perineal pain: Secondary | ICD-10-CM

## 2010-09-25 HISTORY — DX: Abnormal levels of other serum enzymes: R74.8

## 2010-09-25 MED ORDER — TECHNETIUM TC 99M MEDRONATE IV KIT
23.7000 | PACK | Freq: Once | INTRAVENOUS | Status: AC | PRN
  Administered 2010-09-25: 23.7 via INTRAVENOUS

## 2011-03-17 ENCOUNTER — Encounter: Payer: Self-pay | Admitting: Cardiology

## 2011-04-03 DIAGNOSIS — H3523 Other non-diabetic proliferative retinopathy, bilateral: Secondary | ICD-10-CM | POA: Insufficient documentation

## 2011-08-04 DIAGNOSIS — E113599 Type 2 diabetes mellitus with proliferative diabetic retinopathy without macular edema, unspecified eye: Secondary | ICD-10-CM | POA: Insufficient documentation

## 2012-08-03 ENCOUNTER — Other Ambulatory Visit: Payer: Self-pay | Admitting: Internal Medicine

## 2012-08-03 DIAGNOSIS — R1110 Vomiting, unspecified: Secondary | ICD-10-CM

## 2012-08-03 DIAGNOSIS — R42 Dizziness and giddiness: Secondary | ICD-10-CM

## 2012-08-06 ENCOUNTER — Inpatient Hospital Stay: Admission: RE | Admit: 2012-08-06 | Payer: 59 | Source: Ambulatory Visit

## 2012-08-11 ENCOUNTER — Other Ambulatory Visit: Payer: Self-pay | Admitting: Internal Medicine

## 2012-08-11 DIAGNOSIS — R42 Dizziness and giddiness: Secondary | ICD-10-CM

## 2012-08-11 DIAGNOSIS — R109 Unspecified abdominal pain: Secondary | ICD-10-CM

## 2012-08-11 DIAGNOSIS — R1030 Lower abdominal pain, unspecified: Secondary | ICD-10-CM

## 2012-08-11 DIAGNOSIS — R1110 Vomiting, unspecified: Secondary | ICD-10-CM

## 2012-08-18 ENCOUNTER — Ambulatory Visit
Admission: RE | Admit: 2012-08-18 | Discharge: 2012-08-18 | Disposition: A | Discharge status: Home / Self Care | Payer: 59 | Source: Ambulatory Visit | Attending: Internal Medicine | Admitting: Internal Medicine

## 2012-08-18 DIAGNOSIS — R1110 Vomiting, unspecified: Secondary | ICD-10-CM

## 2012-08-18 DIAGNOSIS — R42 Dizziness and giddiness: Secondary | ICD-10-CM

## 2012-08-18 DIAGNOSIS — R109 Unspecified abdominal pain: Secondary | ICD-10-CM

## 2012-08-18 DIAGNOSIS — R1030 Lower abdominal pain, unspecified: Secondary | ICD-10-CM

## 2013-03-15 ENCOUNTER — Emergency Department (HOSPITAL_COMMUNITY): Payer: Medicare Other

## 2013-03-15 ENCOUNTER — Emergency Department (HOSPITAL_COMMUNITY)
Admission: EM | Admit: 2013-03-15 | Discharge: 2013-03-15 | Disposition: A | Discharge status: Home / Self Care | Payer: Medicare Other | Attending: Emergency Medicine | Admitting: Emergency Medicine

## 2013-03-15 ENCOUNTER — Encounter (HOSPITAL_COMMUNITY): Payer: Self-pay | Admitting: Emergency Medicine

## 2013-03-15 DIAGNOSIS — Z794 Long term (current) use of insulin: Secondary | ICD-10-CM | POA: Insufficient documentation

## 2013-03-15 DIAGNOSIS — K8020 Calculus of gallbladder without cholecystitis without obstruction: Secondary | ICD-10-CM

## 2013-03-15 DIAGNOSIS — Z8669 Personal history of other diseases of the nervous system and sense organs: Secondary | ICD-10-CM | POA: Insufficient documentation

## 2013-03-15 DIAGNOSIS — Z862 Personal history of diseases of the blood and blood-forming organs and certain disorders involving the immune mechanism: Secondary | ICD-10-CM | POA: Insufficient documentation

## 2013-03-15 DIAGNOSIS — F329 Major depressive disorder, single episode, unspecified: Secondary | ICD-10-CM | POA: Insufficient documentation

## 2013-03-15 DIAGNOSIS — Z79899 Other long term (current) drug therapy: Secondary | ICD-10-CM | POA: Insufficient documentation

## 2013-03-15 DIAGNOSIS — E119 Type 2 diabetes mellitus without complications: Secondary | ICD-10-CM | POA: Insufficient documentation

## 2013-03-15 DIAGNOSIS — F3289 Other specified depressive episodes: Secondary | ICD-10-CM | POA: Insufficient documentation

## 2013-03-15 DIAGNOSIS — Z8719 Personal history of other diseases of the digestive system: Secondary | ICD-10-CM | POA: Insufficient documentation

## 2013-03-15 DIAGNOSIS — R42 Dizziness and giddiness: Secondary | ICD-10-CM | POA: Insufficient documentation

## 2013-03-15 DIAGNOSIS — E039 Hypothyroidism, unspecified: Secondary | ICD-10-CM | POA: Insufficient documentation

## 2013-03-15 DIAGNOSIS — M549 Dorsalgia, unspecified: Secondary | ICD-10-CM | POA: Insufficient documentation

## 2013-03-15 DIAGNOSIS — R0602 Shortness of breath: Secondary | ICD-10-CM | POA: Insufficient documentation

## 2013-03-15 LAB — BASIC METABOLIC PANEL
BUN: 26 mg/dL — ABNORMAL HIGH (ref 6–23)
Calcium: 9.3 mg/dL (ref 8.4–10.5)
Chloride: 103 mEq/L (ref 96–112)
GFR calc non Af Amer: 32 mL/min — ABNORMAL LOW (ref >90)
Glucose, Bld: 105 mg/dL — ABNORMAL HIGH (ref 70–99)
Potassium: 3.7 mEq/L (ref 3.5–5.1)
Sodium: 140 mEq/L (ref 135–145)

## 2013-03-15 LAB — CBC
HCT: 42.5 % (ref 36.0–46.0)
Hemoglobin: 14 g/dL (ref 12.0–15.0)
MCH: 31.2 pg (ref 26.0–34.0)
MCHC: 32.9 g/dL (ref 30.0–36.0)

## 2013-03-15 LAB — GLUCOSE, CAPILLARY

## 2013-03-15 LAB — HEPATIC FUNCTION PANEL
ALT: 97 U/L — ABNORMAL HIGH (ref 0–35)
AST: 268 U/L — ABNORMAL HIGH (ref 0–37)
Albumin: 3.5 g/dL (ref 3.5–5.2)
Bilirubin, Direct: 0.5 mg/dL — ABNORMAL HIGH (ref 0.0–0.3)
Total Bilirubin: 0.9 mg/dL (ref 0.3–1.2)

## 2013-03-15 LAB — TROPONIN I: Troponin I: <0.3 ng/mL (ref <0.30)

## 2013-03-15 LAB — LIPASE, BLOOD: Lipase: 12 U/L (ref 11–59)

## 2013-03-15 MED ORDER — ONDANSETRON HCL 4 MG/2ML IJ SOLN
4.0000 mg | Freq: Once | INTRAMUSCULAR | Status: AC
  Administered 2013-03-15: 4 mg via INTRAVENOUS
  Filled 2013-03-15: qty 2

## 2013-03-15 MED ORDER — ONDANSETRON HCL 4 MG/2ML IJ SOLN
INTRAMUSCULAR | Status: AC
  Filled 2013-03-15: qty 2

## 2013-03-15 MED ORDER — IOHEXOL 350 MG/ML SOLN
80.0000 mL | Freq: Once | INTRAVENOUS | Status: AC | PRN
  Administered 2013-03-15: 80 mL via INTRAVENOUS

## 2013-03-15 MED ORDER — TRAMADOL HCL 50 MG PO TABS: 50.0000 mg | ORAL_TABLET | Freq: Four times a day (QID) | ORAL | Status: DC | PRN

## 2013-03-15 MED ORDER — ONDANSETRON HCL 4 MG/2ML IJ SOLN: 4.0000 mg | Freq: Once | INTRAMUSCULAR | Status: DC

## 2013-03-15 MED ORDER — SODIUM CHLORIDE 0.9 % IV SOLN
INTRAVENOUS | Status: DC
  Administered 2013-03-15: 02:00:00 via INTRAVENOUS

## 2013-03-15 MED ORDER — ASPIRIN 81 MG PO CHEW
324.0000 mg | CHEWABLE_TABLET | Freq: Once | ORAL | Status: AC
  Administered 2013-03-15: 324 mg via ORAL
  Filled 2013-03-15: qty 4

## 2013-03-15 MED ORDER — ONDANSETRON HCL 4 MG/2ML IJ SOLN
4.0000 mg | Freq: Once | INTRAMUSCULAR | Status: AC
  Administered 2013-03-15: 4 mg via INTRAVENOUS

## 2013-03-15 MED ORDER — ONDANSETRON 4 MG PO TBDP: 4.0000 mg | ORAL_TABLET | Freq: Three times a day (TID) | ORAL | Status: DC | PRN

## 2013-03-15 MED ORDER — MORPHINE SULFATE 2 MG/ML IJ SOLN
2.0000 mg | Freq: Once | INTRAMUSCULAR | Status: AC
  Administered 2013-03-15: 2 mg via INTRAVENOUS
  Filled 2013-03-15: qty 1

## 2013-03-15 MED ORDER — NITROGLYCERIN 0.4 MG SL SUBL: 0.4000 mg | SUBLINGUAL_TABLET | SUBLINGUAL | Status: DC | PRN

## 2013-03-15 NOTE — ED Provider Notes (Signed)
CSN: [id number]     Arrival date & time 03/15/13  0017 History   First MD Initiated Contact with Patient 03/15/13 0018     Chief Complaint  Patient presents with  . Chest Pain   (Consider location/radiation/quality/duration/timing/severity/associated sxs/prior Treatment) Patient is a 62 y.o. female presenting with chest pain. The history is provided by the patient.  Chest Pain Associated symptoms: abdominal pain, back pain, dizziness, nausea and shortness of breath   Associated symptoms: no fever, no headache and not vomiting    patient is followed by [facility] medical. Patient with onset of chest pain at 11 PM. Pain is left lower chest and epigastric area radiates around to both sides of the back. Scribed as a heaviness pressure. Anarely Nicholls stent currently 8/10. Associated with nausea shortness of breath dizziness no diarrhea no diaphoresis.  Past Medical History  Diagnosis Date  . Alkaline phosphatase elevation   . Chest pain   . Fatigue   . Diabetes mellitus type II   . Retinopathy   . Peripheral neuropathy   . Neuropathy   . Hypothyroidism   . Hyperlipidemia   . GERD (gastroesophageal reflux disease)   . Cataract   . Gout   . Anemia   . Depression    Past Surgical History  Procedure Laterality Date  . Combined hysterectomy vaginal / oophorectomy / a&p repair  1987    Unilateral oophorectomy, h/o uterine prolapse  . Cervical spine surgery  2006   Family History  Problem Relation Age of Onset  . COPD Mother   . Lung cancer Father   . Diabetes Sister    History  Substance Use Topics  . Smoking status: Never Smoker   . Smokeless tobacco: Not on file  . Alcohol Use: No   OB History   Grav Para Term Preterm Abortions TAB SAB Ect Mult Living                 Review of Systems  Constitutional: Negative for fever.  HENT: Negative for congestion.   Respiratory: Positive for chest tightness and shortness of breath.   Cardiovascular: Positive for chest pain.   Gastrointestinal: Positive for nausea and abdominal pain. Negative for vomiting and diarrhea.  Genitourinary: Negative for dysuria.  Musculoskeletal: Positive for back pain. Negative for neck pain.  Skin: Negative for rash.  Neurological: Positive for dizziness. Negative for headaches.  Hematological: Does not bruise/bleed easily.  Psychiatric/Behavioral: Negative for confusion.    Allergies  Statins  Home Medications   Current Outpatient Rx  Name  Route  Sig  Dispense  Refill  . acetaminophen (TYLENOL) 500 MG tablet   Oral   Take 500 mg by mouth as needed.           [name] glucose blood test strip   Other   1 each by Other route as needed. Use as instructed          . insulin NPH (HUMULIN N,NOVOLIN N) 100 UNIT/ML injection   Subcutaneous   Inject 20 Units into the skin. Humulin N 20 and Novolin 60 depends on sugar level         . levothyroxine (SYNTHROID, LEVOTHROID) 100 MCG tablet   Oral   Take 100 mcg by mouth daily.           . ramipril (ALTACE) 10 MG capsule   Oral   Take 10 mg by mouth daily.           . saxagliptin HCl (ONGLYZA) 2.5 MG TABS  tablet   Oral   Take 2.5 mg by mouth daily.           [name] docusate sodium (COLACE) 100 MG capsule   Oral   Take 100 mg by mouth as needed.           [name] glipiZIDE (GLUCOTROL) 5 MG tablet   Oral   Take 5 mg by mouth 2 (two) times daily before a meal.           . ondansetron (ZOFRAN ODT) 4 MG disintegrating tablet   Oral   Take 1 tablet (4 mg total) by mouth every 8 (eight) hours as needed.   10 tablet   1   . traMADol (ULTRAM) 50 MG tablet   Oral   Take 1 tablet (50 mg total) by mouth every 6 (six) hours as needed.   20 tablet   0    BP 149/70  Pulse 92  Temp(Src) 97.6 F (36.4 C) (Oral)  Resp 26  Ht 5' 6"  (1.676 m)  Wt 238 lb (107.956 kg)  BMI 38.43 kg/m2  SpO2 96% Physical Exam  Nursing note and vitals reviewed. Constitutional: She is oriented to person, place, and time. She appears  well-developed and well-nourished. No distress.  HENT:  Head: Normocephalic and atraumatic.  Mouth/Throat: Oropharynx is clear and moist.  Eyes: Conjunctivae and EOM are normal. Pupils are equal, round, and reactive to light.  Neck: Normal range of motion.  Cardiovascular: Normal rate and normal heart sounds.   No murmur heard. Pulmonary/Chest: Effort normal and breath sounds normal. No respiratory distress.  Abdominal: Soft. Bowel sounds are normal. There is no tenderness.  Musculoskeletal: She exhibits no edema.  Neurological: She is alert and oriented to person, place, and time. No cranial nerve deficit. She exhibits normal muscle tone. Coordination normal.  Skin: Skin is warm. No rash noted.    ED Course  Procedures (including critical care time) Labs Review Labs Reviewed  CBC - Abnormal; Notable for the following:    WBC 11.7 (*)    All other components within normal limits  BASIC METABOLIC PANEL - Abnormal; Notable for the following:    Glucose, Bld 105 (*)    BUN 26 (*)    Creatinine, Ser 1.67 (*)    GFR calc non Af Amer 32 (*)    GFR calc Af Amer 37 (*)    All other components within normal limits  PRO B NATRIURETIC PEPTIDE - Abnormal; Notable for the following:    Pro B Natriuretic peptide (BNP) 517.2 (*)    All other components within normal limits  HEPATIC FUNCTION PANEL - Abnormal; Notable for the following:    AST 268 (*)    ALT 97 (*)    Alkaline Phosphatase 222 (*)    Bilirubin, Direct 0.5 (*)    All other components within normal limits  D-DIMER, QUANTITATIVE - Abnormal; Notable for the following:    D-Dimer, Quant 2.29 (*)    All other components within normal limits  TROPONIN I  LIPASE, BLOOD  GLUCOSE, CAPILLARY  TROPONIN I   Results for orders placed during the hospital encounter of 03/15/13  CBC      Result Value Range   WBC 11.7 (*) 4.0 - 10.5 K/uL   RBC 4.49  3.87 - 5.11 MIL/uL   Hemoglobin 14.0  12.0 - 15.0 g/dL   HCT 42.5  36.0 - 46.0 %    MCV 94.7  78.0 - 100.0 fL   MCH 31.2  26.0 - 34.0 pg   MCHC 32.9  30.0 - 36.0 g/dL   RDW 13.7  11.5 - 15.5 %   Platelets 165  150 - 400 K/uL  BASIC METABOLIC PANEL      Result Value Range   Sodium 140  135 - 145 mEq/L   Potassium 3.7  3.5 - 5.1 mEq/L   Chloride 103  96 - 112 mEq/L   CO2 22  19 - 32 mEq/L   Glucose, Bld 105 (*) 70 - 99 mg/dL   BUN 26 (*) 6 - 23 mg/dL   Creatinine, Ser 1.67 (*) 0.50 - 1.10 mg/dL   Calcium 9.3  8.4 - 10.5 mg/dL   GFR calc non Af Amer 32 (*) >90 mL/min   GFR calc Af Amer 37 (*) >90 mL/min  PRO B NATRIURETIC PEPTIDE      Result Value Range   Pro B Natriuretic peptide (BNP) 517.2 (*) 0 - 125 pg/mL  TROPONIN I      Result Value Range   Troponin I <0.30  <0.30 ng/mL  LIPASE, BLOOD      Result Value Range   Lipase 12  11 - 59 U/L  HEPATIC FUNCTION PANEL      Result Value Range   Total Protein 8.1  6.0 - 8.3 g/dL   Albumin 3.5  3.5 - 5.2 g/dL   AST 268 (*) 0 - 37 U/L   ALT 97 (*) 0 - 35 U/L   Alkaline Phosphatase 222 (*) 39 - 117 U/L   Total Bilirubin 0.9  0.3 - 1.2 mg/dL   Bilirubin, Direct 0.5 (*) 0.0 - 0.3 mg/dL   Indirect Bilirubin 0.4  0.3 - 0.9 mg/dL  D-DIMER, QUANTITATIVE      Result Value Range   D-Dimer, Quant 2.29 (*) 0.00 - 0.48 ug/mL-FEU  GLUCOSE, CAPILLARY      Result Value Range   Glucose-Capillary 99  70 - 99 mg/dL   Comment 1 Notify RN     Comment 2 Documented in Chart    TROPONIN I      Result Value Range   Troponin I <0.30  <0.30 ng/mL    Imaging Review Ct Angio Chest Pe W/cm &/or Wo Cm  03/15/2013   CLINICAL DATA:  Chest pain, nausea and vomiting.  EXAM: CT ANGIOGRAPHY CHEST  CT ABDOMEN AND PELVIS WITH CONTRAST  TECHNIQUE: Multidetector CT imaging of the chest was performed using the standard protocol during bolus administration of intravenous contrast. Multiplanar CT image reconstructions including MIPs were obtained to evaluate the vascular anatomy. Multidetector CT imaging of the abdomen and pelvis was performed using  the standard protocol during bolus administration of intravenous contrast.  CONTRAST:  91m OMNIPAQUE IOHEXOL 350 MG/ML SOLN  COMPARISON:  Chest radiograph March 15, 2013 at 0046 hr. Abdominal ultrasound Aug 18, 2012  FINDINGS: CTA CHEST FINDINGS  Thoracic aorta is normal in course and caliber, with mild calcific atherosclerosis. Though not tailored for evaluation, no central pulmonary arterial filling defects.  No pulmonary nodules, masses, focal consolidations or pleural effusions though, very mild respiratory motion may degrades sensitivity. Tracheobronchial tree is patent and midline, without significant bronchial wall thickening. No pneumothorax.  Subcentimeter prevascular, pretracheal lymph nodes may be reactive, no lymphadenopathy by CT size criteria. Heart and pericardium are nonsuspicious. Partially characterized ACDF. Soft tissues are nonsuspicious.  CT ABDOMEN and PELVIS FINDINGS  Multiple subcentimeter gallstones at the gallbladder mid neck, with mild gallbladder distension, and suspected mild wall thickening. 2 mm calcification at  the pancreatic head may be intra biliary, coronal 60/120.  Borderline hepatomegaly, the liver is otherwise unremarkable. The spleen, gallbladder are unremarkable. Extremely atrophic pancreas. A 7 mm calcification in the region of the pancreatic head associated with a tubular fluid-filled structure which may reflect dilated pancreatic to 8 mm. Additional tiny pancreatic calcifications seen. Adrenal glands are nonsuspicious.  Stomach, small and large bowel are normal in course and caliber without wall thickening or inflammatory changes. Normal appendix. Internal reproductive organs appear surgically absent.  Kidneys are unremarkable. Great vessels are normal in course and caliber with trace calcific atherosclerosis. No intraperitoneal free fluid nor free air. Soft tissues are nonsuspicious. Large L4 superior endplate chronic Schmorl's nodes.  Review of the MIP images confirms  the above findings.  IMPRESSION: CTA chest: No acute vascular injury, no aortic dissection or aneurysm. No definite central pulmonary arterial filling defects though, bolus timing tailored for evaluation of the aorta.  No acute cardiopulmonary process.  CT abdomen and pelvis: Cholelithiasis, with equivocal findings for acute cholecystitis. Pancreatic calcifications with severely atrophic pancreas, and possible dilated pancreatic duct. Pancreatic head calcification may in fact, be within the distal common bile duct. No intrahepatic biliary dilatation.   Electronically Signed   By: Elon Alas   On: 03/15/2013 04:04   Ct Abdomen Pelvis W Contrast  03/15/2013   CLINICAL DATA:  Chest pain, nausea and vomiting.  EXAM: CT ANGIOGRAPHY CHEST  CT ABDOMEN AND PELVIS WITH CONTRAST  TECHNIQUE: Multidetector CT imaging of the chest was performed using the standard protocol during bolus administration of intravenous contrast. Multiplanar CT image reconstructions including MIPs were obtained to evaluate the vascular anatomy. Multidetector CT imaging of the abdomen and pelvis was performed using the standard protocol during bolus administration of intravenous contrast.  CONTRAST:  6m OMNIPAQUE IOHEXOL 350 MG/ML SOLN  COMPARISON:  Chest radiograph March 15, 2013 at 0046 hr. Abdominal ultrasound Aug 18, 2012  FINDINGS: CTA CHEST FINDINGS  Thoracic aorta is normal in course and caliber, with mild calcific atherosclerosis. Though not tailored for evaluation, no central pulmonary arterial filling defects.  No pulmonary nodules, masses, focal consolidations or pleural effusions though, very mild respiratory motion may degrades sensitivity. Tracheobronchial tree is patent and midline, without significant bronchial wall thickening. No pneumothorax.  Subcentimeter prevascular, pretracheal lymph nodes may be reactive, no lymphadenopathy by CT size criteria. Heart and pericardium are nonsuspicious. Partially characterized ACDF.  Soft tissues are nonsuspicious.  CT ABDOMEN and PELVIS FINDINGS  Multiple subcentimeter gallstones at the gallbladder mid neck, with mild gallbladder distension, and suspected mild wall thickening. 2 mm calcification at the pancreatic head may be intra biliary, coronal 60/120.  Borderline hepatomegaly, the liver is otherwise unremarkable. The spleen, gallbladder are unremarkable. Extremely atrophic pancreas. A 7 mm calcification in the region of the pancreatic head associated with a tubular fluid-filled structure which may reflect dilated pancreatic to 8 mm. Additional tiny pancreatic calcifications seen. Adrenal glands are nonsuspicious.  Stomach, small and large bowel are normal in course and caliber without wall thickening or inflammatory changes. Normal appendix. Internal reproductive organs appear surgically absent.  Kidneys are unremarkable. Great vessels are normal in course and caliber with trace calcific atherosclerosis. No intraperitoneal free fluid nor free air. Soft tissues are nonsuspicious. Large L4 superior endplate chronic Schmorl's nodes.  Review of the MIP images confirms the above findings.  IMPRESSION: CTA chest: No acute vascular injury, no aortic dissection or aneurysm. No definite central pulmonary arterial filling defects though, bolus timing tailored for evaluation of  the aorta.  No acute cardiopulmonary process.  CT abdomen and pelvis: Cholelithiasis, with equivocal findings for acute cholecystitis. Pancreatic calcifications with severely atrophic pancreas, and possible dilated pancreatic duct. Pancreatic head calcification may in fact, be within the distal common bile duct. No intrahepatic biliary dilatation.   Electronically Signed   By: Elon Alas   On: 03/15/2013 04:04   Dg Chest Port 1 View  03/15/2013   CLINICAL DATA:  Chest pain  EXAM: PORTABLE CHEST - 1 VIEW  COMPARISON:  01/19/2009  FINDINGS: Generous heart size without overt cardiomegaly or change from prior. Tortuous  aorta. Diffuse interstitial coarsening which was not seen previously. No effusion or pneumothorax. Lower cervical and cervicothoracic junction ACDF with plate and screw.  IMPRESSION: No acute cardiopulmonary disease suspected. Mild interstitial coarsening is likely technical or bronchitic.   Electronically Signed   By: Jorje Guild M.D.   On: 03/15/2013 00:56    EKG Interpretation    Date/Time:  Wednesday March 15 2013 00:22:57 EST Ventricular Rate:  72 PR Interval:  144 QRS Duration: 80 QT Interval:  396 QTC Calculation: 433 R Axis:   -10 Text Interpretation:  Normal sinus rhythm Moderate voltage criteria for LVH, may be normal variant Borderline ECG When compared with ECG of 19-Jan-2009 22:55, Questionable change in QRS axis Confirmed by Kiyra Slaubaugh  MD, Moriyah Byington ([id number]) on 03/15/2013 12:39:58 AM            MDM   1. Cholelithiasis    The patient is now pain-free no tenderness to palpation to the abdomen. Patient feeling significantly better. Extensive workup to include cardiac workup which was negative EKG without acute changes troponin x2 negative. Some concern for a pulmonary embolus was ruled out by CT and GU. CT abdomen is consistent with gallstones there was some question of acute cholecystitis. Patient only with mild elevation in the white blood cell count. Bilirubin is not elevated lipase is not elevated currently nontender and no pain in the abdomen. Patient will be discharged home. The concerns for some pancreatic duct abnormalities noted. Patient given referral to GI medicine and to terminal surgery. Patient knows to return for persistent pain fever or persistent vomiting.    Mervin Kung, MD 03/15/13 [phone]

## 2013-03-15 NOTE — ED Notes (Signed)
Pt states she had onset on chest pain an hour and half ago, thought is was indigestion. Continued to get worse with SOB, pain in center of chest radiating around to the back. Pt felt dizzy, denies nausea, vomiting, or sweating.  Son is with the patient.

## 2013-03-15 NOTE — ED Notes (Signed)
Pt has vomited a second time.  No relief from Zofran previously given.

## 2013-03-27 ENCOUNTER — Encounter (HOSPITAL_COMMUNITY): Payer: Self-pay | Admitting: Pharmacy Technician

## 2013-03-27 ENCOUNTER — Other Ambulatory Visit: Payer: Self-pay | Admitting: Gastroenterology

## 2013-03-27 ENCOUNTER — Encounter (HOSPITAL_COMMUNITY): Payer: Self-pay | Admitting: *Deleted

## 2013-03-30 NOTE — Anesthesia Preprocedure Evaluation (Addendum)
Anesthesia Evaluation  Patient identified by MRN, date of birth, ID band Patient awake    Reviewed: Allergy & Precautions, H&P , NPO status , Patient's Chart, lab work & pertinent test results  Airway Mallampati: II TM Distance: >3 FB Neck ROM: Full    Dental  (+) Dental Advisory Given   Pulmonary neg pulmonary ROS,  breath sounds clear to auscultation        Cardiovascular negative cardio ROS  Rhythm:Regular Rate:Normal     Neuro/Psych  Headaches, PSYCHIATRIC DISORDERS Depression negative psych ROS   GI/Hepatic Neg liver ROS, GERD-  Medicated,  Endo/Other  negative endocrine ROSdiabetes, Type 2, Insulin DependentHypothyroidism   Renal/GU Renal Insufficiency and CRFRenal disease     Musculoskeletal negative musculoskeletal ROS (+)   Abdominal (+) + obese,   Peds  Hematology  (+) anemia ,   Anesthesia Other Findings   Reproductive/Obstetrics negative OB ROS                          Anesthesia Physical Anesthesia Plan  ASA: III  Anesthesia Plan: MAC   Post-op Pain Management:    Induction: Intravenous  Airway Management Planned:   Additional Equipment:   Intra-op Plan:   Post-operative Plan:   Informed Consent: I have reviewed the patients History and Physical, chart, labs and discussed the procedure including the risks, benefits and alternatives for the proposed anesthesia with the patient or authorized representative who has indicated his/her understanding and acceptance.   Dental advisory given  Plan Discussed with: CRNA  Anesthesia Plan Comments:         Anesthesia Quick Evaluation

## 2013-03-31 ENCOUNTER — Encounter (HOSPITAL_COMMUNITY)
Admission: RE | Disposition: A | Discharge status: Home / Self Care | Payer: Self-pay | Source: Ambulatory Visit | Attending: Gastroenterology

## 2013-03-31 ENCOUNTER — Encounter (HOSPITAL_COMMUNITY): Payer: Self-pay | Admitting: *Deleted

## 2013-03-31 ENCOUNTER — Encounter (HOSPITAL_COMMUNITY): Payer: Medicare Other | Admitting: Anesthesiology

## 2013-03-31 ENCOUNTER — Ambulatory Visit (HOSPITAL_COMMUNITY)
Admission: RE | Admit: 2013-03-31 | Discharge: 2013-03-31 | Disposition: A | Discharge status: Home / Self Care | Payer: Medicare Other | Source: Ambulatory Visit | Attending: Gastroenterology | Admitting: Gastroenterology

## 2013-03-31 ENCOUNTER — Ambulatory Visit (HOSPITAL_COMMUNITY): Payer: Medicare Other | Admitting: Anesthesiology

## 2013-03-31 DIAGNOSIS — R748 Abnormal levels of other serum enzymes: Secondary | ICD-10-CM | POA: Insufficient documentation

## 2013-03-31 DIAGNOSIS — E785 Hyperlipidemia, unspecified: Secondary | ICD-10-CM | POA: Insufficient documentation

## 2013-03-31 DIAGNOSIS — E039 Hypothyroidism, unspecified: Secondary | ICD-10-CM | POA: Insufficient documentation

## 2013-03-31 DIAGNOSIS — E119 Type 2 diabetes mellitus without complications: Secondary | ICD-10-CM | POA: Insufficient documentation

## 2013-03-31 DIAGNOSIS — N289 Disorder of kidney and ureter, unspecified: Secondary | ICD-10-CM | POA: Insufficient documentation

## 2013-03-31 DIAGNOSIS — K8689 Other specified diseases of pancreas: Secondary | ICD-10-CM | POA: Insufficient documentation

## 2013-03-31 DIAGNOSIS — N183 Chronic kidney disease, stage 3 unspecified: Secondary | ICD-10-CM | POA: Insufficient documentation

## 2013-03-31 DIAGNOSIS — K219 Gastro-esophageal reflux disease without esophagitis: Secondary | ICD-10-CM | POA: Insufficient documentation

## 2013-03-31 HISTORY — DX: Atrial septal defect, unspecified: Q21.10

## 2013-03-31 HISTORY — DX: Chronic kidney disease, unspecified: N18.9

## 2013-03-31 HISTORY — PX: EUS: SHX5427

## 2013-03-31 HISTORY — DX: Headache: R51

## 2013-03-31 HISTORY — DX: Atrial septal defect: Q21.1

## 2013-03-31 LAB — HEPATIC FUNCTION PANEL
ALT: 31 U/L (ref 0–35)
AST: 51 U/L — AB (ref 0–37)
Albumin: 2.8 g/dL — ABNORMAL LOW (ref 3.5–5.2)
Alkaline Phosphatase: 212 U/L — ABNORMAL HIGH (ref 39–117)
BILIRUBIN TOTAL: 1.2 mg/dL (ref 0.3–1.2)
Bilirubin, Direct: 0.7 mg/dL — ABNORMAL HIGH (ref 0.0–0.3)
Indirect Bilirubin: 0.5 mg/dL (ref 0.3–0.9)
Total Protein: 6.9 g/dL (ref 6.0–8.3)

## 2013-03-31 LAB — GLUCOSE, CAPILLARY: Glucose-Capillary: 286 mg/dL — ABNORMAL HIGH (ref 70–99)

## 2013-03-31 SURGERY — UPPER ENDOSCOPIC ULTRASOUND (EUS) LINEAR
Anesthesia: Monitor Anesthesia Care

## 2013-03-31 MED ORDER — LABETALOL HCL 5 MG/ML IV SOLN
INTRAVENOUS | Status: DC | PRN
  Administered 2013-03-31 (×2): 2 mg via INTRAVENOUS

## 2013-03-31 MED ORDER — KETAMINE HCL 10 MG/ML IJ SOLN
INTRAMUSCULAR | Status: DC | PRN
  Administered 2013-03-31: 5 mg via INTRAVENOUS
  Administered 2013-03-31: 10 mg via INTRAVENOUS
  Administered 2013-03-31: 30 mg via INTRAVENOUS
  Administered 2013-03-31: 10 mg via INTRAVENOUS

## 2013-03-31 MED ORDER — LIDOCAINE HCL (CARDIAC) 20 MG/ML IV SOLN
INTRAVENOUS | Status: AC
  Filled 2013-03-31: qty 5

## 2013-03-31 MED ORDER — KETAMINE HCL 10 MG/ML IJ SOLN
INTRAMUSCULAR | Status: AC
  Filled 2013-03-31: qty 1

## 2013-03-31 MED ORDER — MIDAZOLAM HCL 5 MG/5ML IJ SOLN
INTRAMUSCULAR | Status: DC | PRN
  Administered 2013-03-31 (×2): 1 mg via INTRAVENOUS

## 2013-03-31 MED ORDER — PROPOFOL 10 MG/ML IV BOLUS
INTRAVENOUS | Status: AC
  Filled 2013-03-31: qty 20

## 2013-03-31 MED ORDER — ONDANSETRON HCL 4 MG/2ML IJ SOLN
INTRAMUSCULAR | Status: AC
  Filled 2013-03-31: qty 2

## 2013-03-31 MED ORDER — DEXAMETHASONE SODIUM PHOSPHATE 10 MG/ML IJ SOLN
INTRAMUSCULAR | Status: AC
  Filled 2013-03-31: qty 1

## 2013-03-31 MED ORDER — ONDANSETRON HCL 4 MG/2ML IJ SOLN
INTRAMUSCULAR | Status: DC | PRN
  Administered 2013-03-31: 4 mg via INTRAVENOUS

## 2013-03-31 MED ORDER — GLYCOPYRROLATE 0.2 MG/ML IJ SOLN
INTRAMUSCULAR | Status: DC | PRN
  Administered 2013-03-31 (×2): .04 mg via INTRAVENOUS

## 2013-03-31 MED ORDER — GLYCOPYRROLATE 0.2 MG/ML IJ SOLN
INTRAMUSCULAR | Status: AC
  Filled 2013-03-31: qty 1

## 2013-03-31 MED ORDER — PROPOFOL INFUSION 10 MG/ML OPTIME
INTRAVENOUS | Status: DC | PRN
  Administered 2013-03-31: 25 ug/kg/min via INTRAVENOUS

## 2013-03-31 MED ORDER — LIDOCAINE HCL (CARDIAC) 20 MG/ML IV SOLN
INTRAVENOUS | Status: DC | PRN
Start: 2013-03-31 — End: 2013-03-31
  Administered 2013-03-31: 50 mg via INTRAVENOUS

## 2013-03-31 MED ORDER — BUTAMBEN-TETRACAINE-BENZOCAINE 2-2-14 % EX AERO
INHALATION_SPRAY | CUTANEOUS | Status: DC | PRN
  Administered 2013-03-31: 2 via TOPICAL

## 2013-03-31 MED ORDER — LABETALOL HCL 5 MG/ML IV SOLN
INTRAVENOUS | Status: AC
  Filled 2013-03-31: qty 4

## 2013-03-31 MED ORDER — MIDAZOLAM HCL 2 MG/2ML IJ SOLN
INTRAMUSCULAR | Status: AC
  Filled 2013-03-31: qty 2

## 2013-03-31 MED ORDER — METOCLOPRAMIDE HCL 5 MG/ML IJ SOLN
INTRAMUSCULAR | Status: AC
  Filled 2013-03-31: qty 2

## 2013-03-31 MED ORDER — SODIUM CHLORIDE 0.9 % IV SOLN
INTRAVENOUS | Status: DC
  Administered 2013-03-31: 1000 mL via INTRAVENOUS

## 2013-03-31 NOTE — Anesthesia Postprocedure Evaluation (Signed)
Anesthesia Post Note  Patient: [name]  Procedure(s) Performed: Procedure(s) (LRB): UPPER ENDOSCOPIC ULTRASOUND (EUS) LINEAR (N/A)  Anesthesia type: MAC  Patient location: PACU  Post pain: Pain level controlled  Post assessment: Post-op Vital signs reviewed  Last Vitals: BP 133/72  Pulse 74  Temp(Src) 36.6 C (Oral)  Resp 17  SpO2 96%  Post vital signs: Reviewed  Level of consciousness: awake  Complications: No apparent anesthesia complications

## 2013-03-31 NOTE — Op Note (Signed)
[facility] [place], [id number]   ENDOSCOPIC ULTRASOUND PROCEDURE REPORT  PATIENT: [name], [name]  MR#: [id number] BIRTHDATE: 1950-09-04  GENDER: Female ENDOSCOPIST: Carol Ada, MD REFERRED BY: PROCEDURE DATE:  03/31/2013 PROCEDURE:   Upper EUS ASA CLASS:      Class III INDICATIONS:   1.  abnormal CT of the GI tract. MEDICATIONS: MAC sedation, administered by CRNA  DESCRIPTION OF PROCEDURE:   After the risks benefits and alternatives of the procedure were  explained, informed consent was obtained. The patient was then placed in the left, lateral, decubitus postion and IV sedation was administered. Throughout the procedure, the patients blood pressure, pulse and oxygen saturations were monitored continuously.  Under direct visualization, the Pentax EUS Linear [id number]  endoscope was introduced through the mouth  and advanced to the second portion of the duodenum .  Water was used as necessary to provide an acoustic interface.  Upon completion of the imaging, water was removed and the patient was sent to the recovery room in satisfactory condition.   FINDINGS: The pancreas exhibited calcifications most prominent in the head and tail of the pancreas.  No evidence of any masses.  The CBD was clearly visualized and it was 6.4 mm in diameter.  The entire length was evaluated and there was no evidence of any stones, however, in the ampullary region a calcification was noted. I was not able to discern if the calcification was within the pancreas versus that of the CBD.  It was too difficult to discern. The scope was then withdrawn from the patient and the procedure completed.  COMPLICATIONS: There were no complications. ENDOSCOPIC VISUALIZATION: ULTRASONIC VISUALIZATION: STAGING: ENDOSCOPIC IMPRESSION: 1) Pancreatic calcifications. 2) Normal sized CBD.  Uncertain if there is a distal calcification or if this is from the  PD.  RECOMMENDATIONS: 1) MRCP.  _______________________________ eSignedCarol Ada, MD 03/31/2013 9:29 AM   CC:

## 2013-03-31 NOTE — Transfer of Care (Signed)
Immediate Anesthesia Transfer of Care Note  Patient: [name]  Procedure(s) Performed: Procedure(s): UPPER ENDOSCOPIC ULTRASOUND (EUS) LINEAR (N/A)  Patient Location: PACU, ENDO  Anesthesia Type:MAC  Level of Consciousness: Patient easily awoken, sedated, comfortable, cooperative, following commands, responds to stimulation.   Airway & Oxygen Therapy: Patient spontaneously breathing, ventilating well, oxygen via simple oxygen mask.  Post-op Assessment: Report given to PACU RN, vital signs reviewed and stable, moving all extremities.   Post vital signs: Reviewed and stable.  Complications: No apparent anesthesia complications

## 2013-03-31 NOTE — Discharge Instructions (Addendum)
Monitored Anesthesia Care  °Monitored anesthesia care is an anesthesia service for a medical procedure. Anesthesia is the loss of the ability to feel pain. It is produced by medications called anesthetics. It may affect a small area of your body (local anesthesia), a large area of your body (regional anesthesia), or your entire body (general anesthesia). The need for monitored anesthesia care depends your procedure, your condition, and the potential need for regional or general anesthesia. It is often provided during procedures where:  °· General anesthesia may be needed if there are complications. This is because you need special care when you are under general anesthesia.   °· You will be under local or regional anesthesia. This is so that you are able to have higher levels of anesthesia if needed.   °· You will receive calming medications (sedatives). This is especially the case if sedatives are given to put you in a semi-conscious state of relaxation (deep sedation). This is because the amount of sedative needed to produce this state can be hard to predict. Too much of a sedative can produce general anesthesia. °Monitored anesthesia care is performed by one or more caregivers who have special training in all types of anesthesia. You will need to meet with these caregivers before your procedure. During this meeting, they will ask you about your medical history. They will also give you instructions to follow. (For example, you will need to stop eating and drinking before your procedure. You may also need to stop or change medications you are taking.) During your procedure, your caregivers will stay with you. They will:  °· Watch your condition. This includes watching you blood pressure, breathing, and level of pain.   °· Diagnose and treat problems that occur.   °· Give medications if they are needed. These may include calming medications (sedatives) and anesthetics.   °· Make sure you are comfortable.   °Having  monitored anesthesia care does not necessarily mean that you will be under anesthesia. It does mean that your caregivers will be able to manage anesthesia if you need it or if it occurs. It also means that you will be able to have a different type of anesthesia than you are having if you need it. When your procedure is complete, your caregivers will continue to watch your condition. They will make sure any medications wear off before you are allowed to go home.  °Document Released: 12/10/2004 Document Revised: 07/11/2012 Document Reviewed: 04/27/2012 °[employer]® Patient Information ©2014 [employer]. ° °

## 2013-03-31 NOTE — Preoperative (Signed)
Beta Blockers   Reason not to administer Beta Blockers:Not Applicable, not on home BB 

## 2013-03-31 NOTE — H&P (Signed)
[name] HPI:  This is a 63 year old female who presented to [facility] on 03/15/2013 with complaints of chest pain.  Extensive work up was performed and it was negative for a PE or MI.  A CTA was performed as well as an abdominal CT scan.  The ABM CT revealed findings of pancreatic head calcification that may involve the distal CBD.  There was also evidence of cholelithiasis.  Her liver enzymes were elevated with an AP of 222, ALT 97, TB 0.5.  At the time of her discharge she was pain free.  She remains pain free at this time. No prior history of ETOH abuse or any known history of pancreatic issues.  There was atrophy noted on the CT scan with possible PD dilation  Past Medical History  Diagnosis Date  . Alkaline phosphatase elevation   . Chest pain     03-17-2013 last chest pain  . Fatigue   . Diabetes mellitus type II   . Retinopathy   . Peripheral neuropathy   . Neuropathy   . Hypothyroidism   . Hyperlipidemia   . GERD (gastroesophageal reflux disease)   . Cataract     both eyes hx of  . Gout   . Anemia   . Depression   . Headache(784.0)     occasional  . Chronic kidney disease     Stage III kidney disease  . ASD (atrial septal defect)     Past Surgical History  Procedure Laterality Date  . Combined hysterectomy vaginal / oophorectomy / a&p repair  1987    Unilateral oophorectomy, h/o uterine prolapse has right ovary  . Cervical spine surgery  2006  . Tonsillectomy  age 77  . Spinal fusion      Family History  Problem Relation Age of Onset  . COPD Mother   . Lung cancer Father   . Diabetes Sister     Social History:  reports that she has never smoked. She has never used smokeless tobacco. She reports that she does not drink alcohol or use illicit drugs.  Allergies:  Allergies  Allergen Reactions  . Statins     REACTION: diarrhea  . Dilaudid [Hydromorphone Hcl] Nausea And Vomiting    After one vomiting episode no further vomiting     Medications:  Scheduled:  Continuous: . sodium chloride 1,000 mL (03/31/13 0804)    Results for orders placed during the hospital encounter of 03/31/13 (from the past 24 hour(s))  GLUCOSE, CAPILLARY     Status: Abnormal   Collection Time    03/31/13  7:53 AM      Result Value Range   Glucose-Capillary 286 (*) 70 - 99 mg/dL     No results found.  ROS:  As stated above in the HPI otherwise negative.  Blood pressure 153/109, pulse 74, temperature 98.8 F (37.1 C), temperature source Oral, resp. rate 17, SpO2 95.00%.    PE: Gen: NAD, Alert and Oriented HEENT:  [place]/AT, EOMI Neck: Supple, no LAD Lungs: CTA Bilaterally CV: RRR without M/G/R ABM: Soft, NTND, +BS Ext: No C/C/E  Assessment/Plan: 1) Abnormal CT scan. 2) Abnormal liver enzymes.   Her TB elevated up to 2.0 and her AP is still elevated.  I will perform an EUS+/-ERCP for further evaluation.  She also reports issues with stage III renal insufficiency.  I will limit any intraductal contrast if the ERCP is required.  Plan: 1) EUS +/- ERCP. Rajinder Mesick D 03/31/2013, 8:36 AM

## 2013-04-03 ENCOUNTER — Encounter (HOSPITAL_COMMUNITY): Payer: Self-pay | Admitting: Gastroenterology

## 2013-04-05 ENCOUNTER — Encounter (HOSPITAL_COMMUNITY): Payer: Self-pay | Admitting: Emergency Medicine

## 2013-04-05 ENCOUNTER — Inpatient Hospital Stay (HOSPITAL_COMMUNITY)
Admission: EM | Admit: 2013-04-05 | Discharge: 2013-04-10 | DRG: 418 | Disposition: A | Discharge status: Home / Self Care | Payer: Medicare Other | Attending: General Surgery | Admitting: General Surgery

## 2013-04-05 ENCOUNTER — Emergency Department (HOSPITAL_COMMUNITY): Payer: Medicare Other

## 2013-04-05 ENCOUNTER — Other Ambulatory Visit: Payer: Self-pay | Admitting: Gastroenterology

## 2013-04-05 DIAGNOSIS — R932 Abnormal findings on diagnostic imaging of liver and biliary tract: Secondary | ICD-10-CM

## 2013-04-05 DIAGNOSIS — K819 Cholecystitis, unspecified: Secondary | ICD-10-CM | POA: Diagnosis present

## 2013-04-05 DIAGNOSIS — Q211 Atrial septal defect: Secondary | ICD-10-CM

## 2013-04-05 DIAGNOSIS — Z885 Allergy status to narcotic agent status: Secondary | ICD-10-CM

## 2013-04-05 DIAGNOSIS — M109 Gout, unspecified: Secondary | ICD-10-CM | POA: Diagnosis present

## 2013-04-05 DIAGNOSIS — F329 Major depressive disorder, single episode, unspecified: Secondary | ICD-10-CM | POA: Diagnosis present

## 2013-04-05 DIAGNOSIS — F3289 Other specified depressive episodes: Secondary | ICD-10-CM | POA: Diagnosis present

## 2013-04-05 DIAGNOSIS — E669 Obesity, unspecified: Secondary | ICD-10-CM | POA: Diagnosis present

## 2013-04-05 DIAGNOSIS — Q2111 Secundum atrial septal defect: Secondary | ICD-10-CM

## 2013-04-05 DIAGNOSIS — G609 Hereditary and idiopathic neuropathy, unspecified: Secondary | ICD-10-CM | POA: Diagnosis present

## 2013-04-05 DIAGNOSIS — Z6837 Body mass index (BMI) 37.0-37.9, adult: Secondary | ICD-10-CM

## 2013-04-05 DIAGNOSIS — Z981 Arthrodesis status: Secondary | ICD-10-CM

## 2013-04-05 DIAGNOSIS — N182 Chronic kidney disease, stage 2 (mild): Secondary | ICD-10-CM | POA: Diagnosis present

## 2013-04-05 DIAGNOSIS — E1142 Type 2 diabetes mellitus with diabetic polyneuropathy: Secondary | ICD-10-CM | POA: Diagnosis present

## 2013-04-05 DIAGNOSIS — H3500 Unspecified background retinopathy: Secondary | ICD-10-CM | POA: Diagnosis present

## 2013-04-05 DIAGNOSIS — N183 Chronic kidney disease, stage 3 unspecified: Secondary | ICD-10-CM | POA: Diagnosis present

## 2013-04-05 DIAGNOSIS — E039 Hypothyroidism, unspecified: Secondary | ICD-10-CM | POA: Diagnosis present

## 2013-04-05 DIAGNOSIS — E1122 Type 2 diabetes mellitus with diabetic chronic kidney disease: Secondary | ICD-10-CM | POA: Diagnosis present

## 2013-04-05 DIAGNOSIS — Z833 Family history of diabetes mellitus: Secondary | ICD-10-CM

## 2013-04-05 DIAGNOSIS — E785 Hyperlipidemia, unspecified: Secondary | ICD-10-CM | POA: Diagnosis present

## 2013-04-05 DIAGNOSIS — R7401 Elevation of levels of liver transaminase levels: Secondary | ICD-10-CM

## 2013-04-05 DIAGNOSIS — R7402 Elevation of levels of lactic acid dehydrogenase (LDH): Secondary | ICD-10-CM

## 2013-04-05 DIAGNOSIS — E1149 Type 2 diabetes mellitus with other diabetic neurological complication: Secondary | ICD-10-CM | POA: Diagnosis present

## 2013-04-05 DIAGNOSIS — R740 Nonspecific elevation of levels of transaminase and lactic acid dehydrogenase [LDH]: Principal | ICD-10-CM

## 2013-04-05 DIAGNOSIS — K8010 Calculus of gallbladder with chronic cholecystitis without obstruction: Secondary | ICD-10-CM

## 2013-04-05 DIAGNOSIS — R17 Unspecified jaundice: Secondary | ICD-10-CM

## 2013-04-05 DIAGNOSIS — K8064 Calculus of gallbladder and bile duct with chronic cholecystitis without obstruction: Principal | ICD-10-CM | POA: Diagnosis present

## 2013-04-05 DIAGNOSIS — K8689 Other specified diseases of pancreas: Secondary | ICD-10-CM | POA: Diagnosis present

## 2013-04-05 DIAGNOSIS — K8060 Calculus of gallbladder and bile duct with cholecystitis, unspecified, without obstruction: Principal | ICD-10-CM | POA: Diagnosis present

## 2013-04-05 DIAGNOSIS — K219 Gastro-esophageal reflux disease without esophagitis: Secondary | ICD-10-CM | POA: Diagnosis present

## 2013-04-05 DIAGNOSIS — Z888 Allergy status to other drugs, medicaments and biological substances status: Secondary | ICD-10-CM

## 2013-04-05 LAB — CBC
HCT: 42.4 % (ref 36.0–46.0)
HEMOGLOBIN: 13.7 g/dL (ref 12.0–15.0)
MCH: 30.4 pg (ref 26.0–34.0)
MCHC: 32.3 g/dL (ref 30.0–36.0)
MCV: 94 fL (ref 78.0–100.0)
Platelets: 154 10*3/uL (ref 150–400)
RBC: 4.51 MIL/uL (ref 3.87–5.11)
RDW: 15.3 % (ref 11.5–15.5)
WBC: 11.3 10*3/uL — ABNORMAL HIGH (ref 4.0–10.5)

## 2013-04-05 LAB — URINALYSIS, ROUTINE W REFLEX MICROSCOPIC
Glucose, UA: NEGATIVE mg/dL
Hgb urine dipstick: NEGATIVE
Ketones, ur: NEGATIVE mg/dL
Leukocytes, UA: NEGATIVE
Nitrite: NEGATIVE
Protein, ur: NEGATIVE mg/dL
Specific Gravity, Urine: 1.022 (ref 1.005–1.030)
Urobilinogen, UA: 4 mg/dL — ABNORMAL HIGH (ref 0.0–1.0)
pH: 5.5 (ref 5.0–8.0)

## 2013-04-05 LAB — COMPREHENSIVE METABOLIC PANEL
ALT: 106 U/L — ABNORMAL HIGH (ref 0–35)
AST: 248 U/L — ABNORMAL HIGH (ref 0–37)
Albumin: 3.4 g/dL — ABNORMAL LOW (ref 3.5–5.2)
Alkaline Phosphatase: 293 U/L — ABNORMAL HIGH (ref 39–117)
BUN: 31 mg/dL — ABNORMAL HIGH (ref 6–23)
CALCIUM: 9.5 mg/dL (ref 8.4–10.5)
CO2: 23 meq/L (ref 19–32)
CREATININE: 1.79 mg/dL — AB (ref 0.50–1.10)
Chloride: 100 mEq/L (ref 96–112)
GFR, EST AFRICAN AMERICAN: 34 mL/min — AB (ref >90)
GFR, EST NON AFRICAN AMERICAN: 29 mL/min — AB (ref >90)
GLUCOSE: 126 mg/dL — AB (ref 70–99)
Potassium: 4.5 mEq/L (ref 3.7–5.3)
Sodium: 139 mEq/L (ref 137–147)
Total Bilirubin: 2.8 mg/dL — ABNORMAL HIGH (ref 0.3–1.2)
Total Protein: 7.7 g/dL (ref 6.0–8.3)

## 2013-04-05 LAB — LIPASE, BLOOD: Lipase: 9 U/L — ABNORMAL LOW (ref 11–59)

## 2013-04-05 MED ORDER — ONDANSETRON HCL 4 MG/2ML IJ SOLN
4.0000 mg | Freq: Four times a day (QID) | INTRAMUSCULAR | Status: DC | PRN
  Administered 2013-04-06 – 2013-04-08 (×2): 4 mg via INTRAVENOUS
  Filled 2013-04-05 (×2): qty 2

## 2013-04-05 MED ORDER — MORPHINE SULFATE 4 MG/ML IJ SOLN
4.0000 mg | INTRAMUSCULAR | Status: AC | PRN
  Administered 2013-04-06 – 2013-04-07 (×2): 4 mg via INTRAVENOUS
  Filled 2013-04-05 (×2): qty 1

## 2013-04-05 MED ORDER — MORPHINE SULFATE 4 MG/ML IJ SOLN: 4.0000 mg | INTRAMUSCULAR | Status: DC | PRN

## 2013-04-05 MED ORDER — BISACODYL 10 MG RE SUPP: 10.0000 mg | Freq: Every day | RECTAL | Status: DC | PRN

## 2013-04-05 MED ORDER — DIPHENHYDRAMINE HCL 50 MG/ML IJ SOLN: 12.5000 mg | Freq: Four times a day (QID) | INTRAMUSCULAR | Status: DC | PRN

## 2013-04-05 MED ORDER — SODIUM CHLORIDE 0.9 % IV SOLN
INTRAVENOUS | Status: DC
  Administered 2013-04-05 – 2013-04-07 (×2): via INTRAVENOUS

## 2013-04-05 MED ORDER — PANTOPRAZOLE SODIUM 40 MG PO TBEC
40.0000 mg | DELAYED_RELEASE_TABLET | Freq: Every day | ORAL | Status: DC
  Administered 2013-04-07 – 2013-04-10 (×4): 40 mg via ORAL
  Filled 2013-04-05 (×6): qty 1

## 2013-04-05 MED ORDER — MORPHINE SULFATE 4 MG/ML IJ SOLN
4.0000 mg | Freq: Once | INTRAMUSCULAR | Status: AC
  Administered 2013-04-05: 4 mg via INTRAVENOUS
  Filled 2013-04-05: qty 1

## 2013-04-05 MED ORDER — ALLOPURINOL 100 MG PO TABS
100.0000 mg | ORAL_TABLET | Freq: Every evening | ORAL | Status: DC
  Administered 2013-04-06 – 2013-04-09 (×3): 100 mg via ORAL
  Filled 2013-04-05 (×6): qty 1

## 2013-04-05 MED ORDER — INSULIN ASPART 100 UNIT/ML ~~LOC~~ SOLN
0.0000 [IU] | Freq: Every day | SUBCUTANEOUS | Status: DC
  Administered 2013-04-08: 2 [IU] via SUBCUTANEOUS

## 2013-04-05 MED ORDER — SENNA 8.6 MG PO TABS
1.0000 | ORAL_TABLET | Freq: Two times a day (BID) | ORAL | Status: DC
  Administered 2013-04-06 – 2013-04-10 (×8): 8.6 mg via ORAL
  Filled 2013-04-05 (×9): qty 1

## 2013-04-05 MED ORDER — SODIUM CHLORIDE 0.9 % IV BOLUS (SEPSIS)
500.0000 mL | Freq: Once | INTRAVENOUS | Status: AC
  Administered 2013-04-05: 500 mL via INTRAVENOUS

## 2013-04-05 MED ORDER — ONDANSETRON HCL 4 MG/2ML IJ SOLN
4.0000 mg | Freq: Once | INTRAMUSCULAR | Status: AC
  Administered 2013-04-05: 4 mg via INTRAVENOUS
  Filled 2013-04-05: qty 2

## 2013-04-05 MED ORDER — INSULIN ASPART 100 UNIT/ML ~~LOC~~ SOLN
0.0000 [IU] | Freq: Three times a day (TID) | SUBCUTANEOUS | Status: DC
  Administered 2013-04-06: 7 [IU] via SUBCUTANEOUS
  Administered 2013-04-06 – 2013-04-07 (×3): 4 [IU] via SUBCUTANEOUS
  Administered 2013-04-07 – 2013-04-08 (×3): 7 [IU] via SUBCUTANEOUS
  Administered 2013-04-08: 4 [IU] via SUBCUTANEOUS
  Administered 2013-04-09 – 2013-04-10 (×5): 7 [IU] via SUBCUTANEOUS

## 2013-04-05 MED ORDER — DEXTROSE 5 % IV SOLN
2.0000 g | Freq: Four times a day (QID) | INTRAVENOUS | Status: DC
  Administered 2013-04-06 – 2013-04-09 (×15): 2 g via INTRAVENOUS
  Filled 2013-04-05 (×18): qty 2

## 2013-04-05 MED ORDER — ACETAMINOPHEN 325 MG PO TABS: 650.0000 mg | ORAL_TABLET | Freq: Four times a day (QID) | ORAL | Status: DC | PRN

## 2013-04-05 MED ORDER — SODIUM CHLORIDE 0.9 % IV SOLN: INTRAVENOUS | Status: DC

## 2013-04-05 MED ORDER — HEPARIN SODIUM (PORCINE) 5000 UNIT/ML IJ SOLN
5000.0000 [IU] | Freq: Three times a day (TID) | INTRAMUSCULAR | Status: DC
  Administered 2013-04-06 – 2013-04-10 (×11): 5000 [IU] via SUBCUTANEOUS
  Filled 2013-04-05 (×17): qty 1

## 2013-04-05 MED ORDER — ACETAMINOPHEN 650 MG RE SUPP
650.0000 mg | Freq: Four times a day (QID) | RECTAL | Status: DC | PRN
  Administered 2013-04-06: 650 mg via RECTAL
  Filled 2013-04-05: qty 1

## 2013-04-05 MED ORDER — DIPHENHYDRAMINE HCL 12.5 MG/5ML PO ELIX: 12.5000 mg | ORAL_SOLUTION | Freq: Four times a day (QID) | ORAL | Status: DC | PRN

## 2013-04-05 MED ORDER — LEVOTHYROXINE SODIUM 150 MCG PO TABS
150.0000 ug | ORAL_TABLET | Freq: Every day | ORAL | Status: DC
  Administered 2013-04-07 – 2013-04-10 (×4): 150 ug via ORAL
  Filled 2013-04-05 (×6): qty 1

## 2013-04-05 NOTE — ED Notes (Signed)
MD at bedside. EDP Dorie Rank in to see pt

## 2013-04-05 NOTE — ED Provider Notes (Signed)
CSN: [id number]     Arrival date & time 04/05/13  1407 History  First MD Initiated Contact with Patient 04/05/13 1439     Chief Complaint  Patient presents with  . RUQ pain     Patient is a 63 y.o. female presenting with abdominal pain. The history is provided by the patient.  Abdominal Pain Pain location:  RUQ Pain quality: aching   Pain radiates to:  Does not radiate Pain severity:  Moderate Onset quality:  Gradual Duration:  5 hours (This episode started this morning after eating a roll and a coke.) Timing:  Constant Chronicity:  Recurrent (Pt started having attacks on Dec 17th.) Relieved by:  Nothing Ineffective treatments:  None tried Associated symptoms: chills, nausea and vomiting   Associated symptoms: no fever   Pt was seen on 12/17.  At that time noted to have cholelithiasis as well as possible pancreatic mass.  Pt has seen Dr Benson Norway GI and is scheduled to see general surgery.  She started having pain again today.  The pain is a 6/10 currently. She has been vomiting frequently.  Past Medical History  Diagnosis Date  . Alkaline phosphatase elevation   . Chest pain     03-17-2013 last chest pain  . Fatigue   . Diabetes mellitus type II   . Retinopathy   . Peripheral neuropathy   . Neuropathy   . Hypothyroidism   . Hyperlipidemia   . GERD (gastroesophageal reflux disease)   . Cataract     both eyes hx of  . Gout   . Anemia   . Depression   . Headache(784.0)     occasional  . Chronic kidney disease     Stage III kidney disease  . ASD (atrial septal defect)    Past Surgical History  Procedure Laterality Date  . Combined hysterectomy vaginal / oophorectomy / a&p repair  1987    Unilateral oophorectomy, h/o uterine prolapse has right ovary  . Cervical spine surgery  2006  . Tonsillectomy  age 79  . Spinal fusion    . Eus N/A 03/31/2013    Procedure: UPPER ENDOSCOPIC ULTRASOUND (EUS) LINEAR;  Surgeon: Beryle Beams, MD;  Location: [facility];  Service: Endoscopy;   Laterality: N/A;   Family History  Problem Relation Age of Onset  . COPD Mother   . Lung cancer Father   . Diabetes Sister    History  Substance Use Topics  . Smoking status: Never Smoker   . Smokeless tobacco: Never Used  . Alcohol Use: No   OB History   Grav Para Term Preterm Abortions TAB SAB Ect Mult Living                 Review of Systems  Constitutional: Positive for chills. Negative for fever.       Rigors   Gastrointestinal: Positive for nausea, vomiting and abdominal pain.  Skin: Negative for pallor and rash.  Psychiatric/Behavioral: Negative for confusion.  All other systems reviewed and are negative.    Allergies  Statins and Dilaudid  Home Medications   Current Outpatient Rx  Name  Route  Sig  Dispense  Refill  . acetaminophen (TYLENOL) 500 MG tablet   Oral   Take 500 mg by mouth every 6 (six) hours as needed for moderate pain.          [name] allopurinol (ZYLOPRIM) 100 MG tablet   Oral   Take 100 mg by mouth every evening.          [name]  insulin NPH (HUMULIN N,NOVOLIN N) 100 UNIT/ML injection   Subcutaneous   Inject 60-65 Units into the skin 2 (two) times daily. Takes 60 units in the morning and 65 units at night         . insulin regular (NOVOLIN R,HUMULIN R) 100 units/mL injection   Subcutaneous   Inject 5-20 Units into the skin 3 (three) times daily before meals.         [name] levothyroxine (SYNTHROID, LEVOTHROID) 150 MCG tablet   Oral   Take 150 mcg by mouth daily before breakfast.         . omeprazole (PRILOSEC) 40 MG capsule   Oral   Take 40 mg by mouth every morning.           BP 108/43  Pulse 108  Temp(Src) 99.4 F (37.4 C) (Oral)  Resp 20  SpO2 95% Physical Exam  Nursing note and vitals reviewed. Constitutional: She appears well-developed and well-nourished.  vomiting  HENT:  Head: Normocephalic and atraumatic.  Right Ear: External ear normal.  Left Ear: External ear normal.  Eyes: Conjunctivae are normal. Right eye  exhibits no discharge. Left eye exhibits no discharge. No scleral icterus.  Neck: Neck supple. No tracheal deviation present.  Cardiovascular: Normal rate, regular rhythm and intact distal pulses.   Pulmonary/Chest: Effort normal and breath sounds normal. No stridor. No respiratory distress. She has no wheezes. She has no rales.  Abdominal: Soft. Bowel sounds are normal. She exhibits no distension. There is tenderness in the right upper quadrant. There is guarding. There is no rigidity and no rebound. No hernia.  Musculoskeletal: She exhibits no edema and no tenderness.  Neurological: She is alert. She has normal strength. No sensory deficit. Cranial nerve deficit:  no gross defecits noted. She exhibits normal muscle tone. She displays no seizure activity. Coordination normal.  Skin: Skin is warm and dry. No rash noted.  Psychiatric: She has a normal mood and affect.    ED Course  Procedures (including critical care time) Labs Review Labs Reviewed  CBC - Abnormal; Notable for the following:    WBC 11.3 (*)    All other components within normal limits  COMPREHENSIVE METABOLIC PANEL - Abnormal; Notable for the following:    Glucose, Bld 126 (*)    BUN 31 (*)    Creatinine, Ser 1.79 (*)    Albumin 3.4 (*)    AST 248 (*)    ALT 106 (*)    Alkaline Phosphatase 293 (*)    Total Bilirubin 2.8 (*)    GFR calc non Af Amer 29 (*)    GFR calc Af Amer 34 (*)    All other components within normal limits  LIPASE, BLOOD - Abnormal; Notable for the following:    Lipase 9 (*)    All other components within normal limits  URINALYSIS, ROUTINE W REFLEX MICROSCOPIC - Abnormal; Notable for the following:    Color, Urine AMBER (*)    Bilirubin Urine MODERATE (*)    Urobilinogen, UA 4.0 (*)    All other components within normal limits   Imaging Review US Abdomen Complete  04/05/2013   CLINICAL DATA:  Right upper quadrant pain. Elevated liver function tests. Elevated renal function tests. Known  gallstones.  EXAM: ULTRASOUND ABDOMEN COMPLETE  COMPARISON:  Ultrasound dated 08/18/2012  FINDINGS: Gallbladder:  There are multiple mobile stones in the gallbladder. Gallbladder wall is thickened and edematous. Negative sonographic Murphy's sign.  Common bile duct:  Diameter: 7.4 mm, normal.  Liver:  Hepatomegaly with diffuse increased echogenicity consistent with hepatic steatosis.  IVC:  Not seen.  Pancreas:  The pancreatic duct is dilated to a diameter of 6 mm with a stone in the pancreatic duct. Prior CT scan demonstrated diffuse pancreatic atrophy with a stone in the proximal duct with distal dilatation.  Spleen:  15.6 cm.  762 cc volume.  Right Kidney:  Length: 8.7 cm. Echogenicity within normal limits. No mass or hydronephrosis visualized.  Left Kidney:  Length: 9.9 cm. Echogenicity within normal limits. No mass or hydronephrosis visualized.  Abdominal aorta:  Obscured.  Other findings:  None.  IMPRESSION: 1. Chronic cholelithiasis.  New edema of the gallbladder wall. 2. 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 3. Hepatomegaly with hepatic steatosis.   Electronically Signed   By: [name] M.D.   On: 04/05/2013 19:20   CT scan from 12 /17 CT abdomen and pelvis: Cholelithiasis, with equivocal findings for  acute cholecystitis. Pancreatic calcifications with severely  atrophic pancreas, and possible dilated pancreatic duct. Pancreatic  head calcification may in fact, be within the distal common bile  duct. No intrahepatic biliary dilatation.  [place] on Jan 2nd.   Pancreatic calcifications confirmed.  NO CBD stone.  MRCP recommended EKG Interpretation    Date/Time:  Wednesday April 05 2013 14:17:22 EST Ventricular Rate:  83 PR Interval:  147 QRS Duration: 129 QT Interval:  405 QTC Calculation: 476 R Axis:   -25 Text Interpretation:  Sinus rhythm Right bundle branch block , new since last tracing Left ventricular  hypertrophy Confirmed by Kirubel Aja  MD-J, Michelina Mexicano ([id number]) on 04/05/2013 3:07:35 PM           Medications  0.9 %  sodium chloride infusion (not administered)  morphine 4 MG/ML injection 4 mg (not administered)  morphine 4 MG/ML injection 4 mg (4 mg Intravenous Given 04/05/13 1541)  ondansetron (ZOFRAN) injection 4 mg (4 mg Intravenous Given 04/05/13 1532)  sodium chloride 0.9 % bolus 500 mL (500 mLs Intravenous New Bag/Given 04/05/13 1531)   1945  Pt is still having pain.  Will order additional medications.  [place] results show chronic cholecystitis.  Will consult with general surgery.   MDM   1. Cholecystitis   2. Elevated bilirubin   3. Pancreatic calcification     Pt now has gallbladder wall thickening.  Concerning for acute cholecystitis.  Will consult with general surgery regarding admission possible cholecystectomy.  Pt still has her elevated bilirubin level but she already has an outpatient ERCP.  MRCP recommended but do not feel that is related to her acute symptoms here.  Kathalene Frames, MD 04/05/13 2011

## 2013-04-05 NOTE — H&P (Signed)
[name] is an 63 y.o. female.   Chief Complaint: RUQ pain HPI: Pt presents to ED with RUQ pain after eating breakfast.  This has not subsided.  She is currently being worked up for elevated LFTs and a PD stone.  The pt was previously admitted to [facility] on 03/15/2013 with complaints of chest pain. Extensive work up was performed and it was negative for a PE or MI. A CTA was performed as well as an abdominal CT scan. The ABM CT revealed findings of pancreatic head calcification that may involve the distal CBD. There was also evidence of cholelithiasis. Her liver enzymes were elevated with an AP of 222, ALT 97, TB 0.5.  No prior history of ETOH abuse or any known history of pancreatic issues. There was atrophy noted on the CT scan with possible PD dilation.  She underwent a EUS by Dr Benson Norway, which showed the PD stone and no signs of choledocholithiasis.  He recommended MRCP and referred her to Dr Marlou Starks.  She is scheduled to see him on the 13th.  She now has new elevations in her LFT's and recurrent RUQ pain with nausea.   Past Medical History  Diagnosis Date  . Alkaline phosphatase elevation   . Chest pain     03-17-2013 last chest pain  . Fatigue   . Diabetes mellitus type II   . Retinopathy   . Peripheral neuropathy   . Neuropathy   . Hypothyroidism   . Hyperlipidemia   . GERD (gastroesophageal reflux disease)   . Cataract     both eyes hx of  . Gout   . Anemia   . Depression   . Headache(784.0)     occasional  . Chronic kidney disease     Stage III kidney disease  . ASD (atrial septal defect)     Past Surgical History  Procedure Laterality Date  . Combined hysterectomy vaginal / oophorectomy / a&p repair  1987    Unilateral oophorectomy, h/o uterine prolapse has right ovary  . Cervical spine surgery  2006  . Tonsillectomy  age 22  . Spinal fusion    . Eus N/A 03/31/2013    Procedure: UPPER ENDOSCOPIC ULTRASOUND (EUS) LINEAR;  Surgeon: Beryle Beams, MD;  Location:  [facility];  Service: Endoscopy;  Laterality: N/A;    Family History  Problem Relation Age of Onset  . COPD Mother   . Lung cancer Father   . Diabetes Sister    Social History:  reports that she has never smoked. She has never used smokeless tobacco. She reports that she does not drink alcohol or use illicit drugs.  Allergies:  Allergies  Allergen Reactions  . Statins     REACTION: diarrhea  . Dilaudid [Hydromorphone Hcl] Nausea And Vomiting    After one vomiting episode no further vomiting     (Not in a hospital admission)  Results for orders placed during the hospital encounter of 04/05/13 (from the past 48 hour(s))  CBC     Status: Abnormal   Collection Time    04/05/13  3:30 PM      Result Value Range   WBC 11.3 (*) 4.0 - 10.5 K/uL   RBC 4.51  3.87 - 5.11 MIL/uL   Hemoglobin 13.7  12.0 - 15.0 g/dL   HCT 42.4  36.0 - 46.0 %   MCV 94.0  78.0 - 100.0 fL   MCH 30.4  26.0 - 34.0 pg   MCHC 32.3  30.0 - 36.0 g/dL   RDW 15.3  11.5 - 15.5 %   Platelets 154  150 - 400 K/uL  COMPREHENSIVE METABOLIC PANEL     Status: Abnormal   Collection Time    04/05/13  3:30 PM      Result Value Range   Sodium 139  137 - 147 mEq/L   Potassium 4.5  3.7 - 5.3 mEq/L   Chloride 100  96 - 112 mEq/L   CO2 23  19 - 32 mEq/L   Glucose, Bld 126 (*) 70 - 99 mg/dL   BUN 31 (*) 6 - 23 mg/dL   Creatinine, Ser 1.79 (*) 0.50 - 1.10 mg/dL   Calcium 9.5  8.4 - 10.5 mg/dL   Total Protein 7.7  6.0 - 8.3 g/dL   Albumin 3.4 (*) 3.5 - 5.2 g/dL   AST 248 (*) 0 - 37 U/L   ALT 106 (*) 0 - 35 U/L   Alkaline Phosphatase 293 (*) 39 - 117 U/L   Total Bilirubin 2.8 (*) 0.3 - 1.2 mg/dL   GFR calc non Af Amer 29 (*) >90 mL/min   GFR calc Af Amer 34 (*) >90 mL/min   Comment: (NOTE)     The eGFR has been calculated using the CKD EPI equation.     This calculation has not been validated in all clinical situations.     eGFR's persistently <90 mL/min signify possible Chronic Kidney     Disease.  LIPASE, BLOOD      Status: Abnormal   Collection Time    04/05/13  3:30 PM      Result Value Range   Lipase 9 (*) 11 - 59 U/L  URINALYSIS, ROUTINE W REFLEX MICROSCOPIC     Status: Abnormal   Collection Time    04/05/13  5:24 PM      Result Value Range   Color, Urine AMBER (*) YELLOW   Comment: BIOCHEMICALS MAY BE AFFECTED BY COLOR   APPearance CLEAR  CLEAR   Specific Gravity, Urine 1.022  1.005 - 1.030   pH 5.5  5.0 - 8.0   Glucose, UA NEGATIVE  NEGATIVE mg/dL   Hgb urine dipstick NEGATIVE  NEGATIVE   Bilirubin Urine MODERATE (*) NEGATIVE   Ketones, ur NEGATIVE  NEGATIVE mg/dL   Protein, ur NEGATIVE  NEGATIVE mg/dL   Urobilinogen, UA 4.0 (*) 0.0 - 1.0 mg/dL   Nitrite NEGATIVE  NEGATIVE   Leukocytes, UA NEGATIVE  NEGATIVE   Comment: MICROSCOPIC NOT DONE ON URINES WITH NEGATIVE PROTEIN, BLOOD, LEUKOCYTES, NITRITE, OR GLUCOSE <1000 mg/dL.   US Abdomen Complete  04/05/2013   CLINICAL DATA:  Right upper quadrant pain. Elevated liver function tests. Elevated renal function tests. Known gallstones.  EXAM: ULTRASOUND ABDOMEN COMPLETE  COMPARISON:  Ultrasound dated 08/18/2012  FINDINGS: Gallbladder:  There are multiple mobile stones in the gallbladder. Gallbladder wall is thickened and edematous. Negative sonographic Murphy's sign.  Common bile duct:  Diameter: 7.4 mm, normal.  Liver:  Hepatomegaly with diffuse increased echogenicity consistent with hepatic steatosis.  IVC:  Not seen.  Pancreas:  The pancreatic duct is dilated to a diameter of 6 mm with a stone in the pancreatic duct. Prior CT scan demonstrated diffuse pancreatic atrophy with a stone in the proximal duct with distal dilatation.  Spleen:  15.6 cm.  762 cc volume.  Right Kidney:  Length: 8.7 cm. Echogenicity within normal limits. No mass or hydronephrosis visualized.  Left Kidney:  Length: 9.9 cm. Echogenicity within normal limits.  No mass or hydronephrosis visualized.  Abdominal aorta:  Obscured.  Other findings:  None.  IMPRESSION: 1. Chronic  cholelithiasis.  New edema of the gallbladder wall. 2. 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 3. Hepatomegaly with hepatic steatosis.   Electronically Signed   By: [name] M.D.   On: 04/05/2013 19:20    Review of Systems  Constitutional: Negative for fever and chills.  Respiratory: Negative for cough and shortness of breath.   Cardiovascular: Negative for chest pain.  Gastrointestinal: Positive for nausea, vomiting, abdominal pain and constipation. Negative for diarrhea.  Genitourinary: Negative for dysuria, urgency, frequency and hematuria.  Neurological: Negative for headaches.    Blood pressure 108/43, pulse 108, temperature 99.4 F (37.4 C), temperature source Oral, resp. rate 20, SpO2 95.00%. Physical Exam  Constitutional: She is oriented to person, place, and time. She appears well-developed and well-nourished. No distress.  HENT:  Head: Normocephalic and atraumatic.  Eyes: Conjunctivae are normal. Pupils are equal, round, and reactive to light.  Neck: Normal range of motion.  Cardiovascular: Normal rate and regular rhythm.   Respiratory: Effort normal and breath sounds normal.  GI: Soft. Bowel sounds are normal. She exhibits no distension. There is tenderness. There is no rebound and no guarding.  RUQ tenderness  Neurological: She is alert and oriented to person, place, and time.  Skin: Skin is warm and dry.    RUQ US IMPRESSION:  1. Chronic cholelithiasis. New edema of the gallbladder wall.  2. 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  3. Hepatomegaly with hepatic steatosis.   Assessment/Plan SHAYANNA THATCH is a 63 y.o. F with RUQ pain and new gallbladder wall edema.  She appears to have acute on most likely chronic cholecystitis.  I will admit her and place her on IV fluids and antibiotics.  I will repeat her labs in  the AM.  If her bilirubin is still elevated, she may benefit from MRCP and a GI consult.    Lakysha Kossman C. 0/05/5463, 6:81 PM

## 2013-04-05 NOTE — ED Notes (Signed)
Per EMS-RUQ pain that started this am-no N/V/D

## 2013-04-05 NOTE — ED Notes (Signed)
Bed: WA19 Expected date:  Expected time:  Means of arrival:  Comments: EMS-RUQ pain 

## 2013-04-06 ENCOUNTER — Inpatient Hospital Stay (HOSPITAL_COMMUNITY): Payer: Medicare Other

## 2013-04-06 ENCOUNTER — Encounter (HOSPITAL_COMMUNITY): Payer: Self-pay | Admitting: *Deleted

## 2013-04-06 ENCOUNTER — Encounter (HOSPITAL_COMMUNITY): Admission: EM | Disposition: A | Discharge status: Home / Self Care | Payer: Self-pay | Source: Home / Self Care

## 2013-04-06 HISTORY — PX: ERCP: SHX5425

## 2013-04-06 LAB — COMPREHENSIVE METABOLIC PANEL WITH GFR
ALT: 91 U/L — ABNORMAL HIGH (ref 0–35)
AST: 159 U/L — ABNORMAL HIGH (ref 0–37)
Albumin: 2.7 g/dL — ABNORMAL LOW (ref 3.5–5.2)
Alkaline Phosphatase: 249 U/L — ABNORMAL HIGH (ref 39–117)
BUN: 34 mg/dL — ABNORMAL HIGH (ref 6–23)
CO2: 19 meq/L (ref 19–32)
Calcium: 8.6 mg/dL (ref 8.4–10.5)
Chloride: 101 meq/L (ref 96–112)
Creatinine, Ser: 1.95 mg/dL — ABNORMAL HIGH (ref 0.50–1.10)
GFR calc Af Amer: 31 mL/min — ABNORMAL LOW
GFR calc non Af Amer: 27 mL/min — ABNORMAL LOW
Glucose, Bld: 252 mg/dL — ABNORMAL HIGH (ref 70–99)
Potassium: 5.2 meq/L (ref 3.7–5.3)
Sodium: 134 meq/L — ABNORMAL LOW (ref 137–147)
Total Bilirubin: 4.2 mg/dL — ABNORMAL HIGH (ref 0.3–1.2)
Total Protein: 6.4 g/dL (ref 6.0–8.3)

## 2013-04-06 LAB — CBC
HCT: 35.5 % — ABNORMAL LOW (ref 36.0–46.0)
Hemoglobin: 11.3 g/dL — ABNORMAL LOW (ref 12.0–15.0)
MCH: 29.7 pg (ref 26.0–34.0)
MCHC: 31.8 g/dL (ref 30.0–36.0)
MCV: 93.4 fL (ref 78.0–100.0)
Platelets: 114 10*3/uL — ABNORMAL LOW (ref 150–400)
RBC: 3.8 MIL/uL — AB (ref 3.87–5.11)
RDW: 15.6 % — AB (ref 11.5–15.5)
WBC: 12.7 10*3/uL — ABNORMAL HIGH (ref 4.0–10.5)

## 2013-04-06 LAB — GLUCOSE, CAPILLARY
GLUCOSE-CAPILLARY: 184 mg/dL — AB (ref 70–99)
GLUCOSE-CAPILLARY: 197 mg/dL — AB (ref 70–99)
Glucose-Capillary: 226 mg/dL — ABNORMAL HIGH (ref 70–99)
Glucose-Capillary: 214 mg/dL — ABNORMAL HIGH (ref 70–99)
Glucose-Capillary: 167 mg/dL — ABNORMAL HIGH (ref 70–99)

## 2013-04-06 LAB — SURGICAL PCR SCREEN
MRSA, PCR: NEGATIVE
Staphylococcus aureus: NEGATIVE

## 2013-04-06 SURGERY — ERCP, WITH INTERVENTION IF INDICATED
Anesthesia: Moderate Sedation

## 2013-04-06 MED ORDER — FENTANYL CITRATE 0.05 MG/ML IJ SOLN
INTRAMUSCULAR | Status: DC | PRN
  Administered 2013-04-06: 25 ug via INTRAVENOUS

## 2013-04-06 MED ORDER — IOHEXOL 350 MG/ML SOLN
INTRAVENOUS | Status: DC | PRN
  Administered 2013-04-06: 15:00:00

## 2013-04-06 MED ORDER — SODIUM CHLORIDE 0.9 % IV BOLUS (SEPSIS)
1000.0000 mL | Freq: Once | INTRAVENOUS | Status: AC
  Administered 2013-04-06: 1000 mL via INTRAVENOUS

## 2013-04-06 MED ORDER — MIDAZOLAM HCL 10 MG/2ML IJ SOLN
INTRAMUSCULAR | Status: DC | PRN
  Administered 2013-04-06: 2 mg via INTRAVENOUS

## 2013-04-06 MED ORDER — FENTANYL CITRATE 0.05 MG/ML IJ SOLN
INTRAMUSCULAR | Status: AC
  Filled 2013-04-06: qty 4

## 2013-04-06 MED ORDER — SODIUM CHLORIDE 0.9 % IV SOLN: INTRAVENOUS | Status: DC

## 2013-04-06 MED ORDER — GLUCAGON HCL (RDNA) 1 MG IJ SOLR
INTRAMUSCULAR | Status: AC
  Filled 2013-04-06: qty 1

## 2013-04-06 MED ORDER — MIDAZOLAM HCL 10 MG/2ML IJ SOLN
INTRAMUSCULAR | Status: AC
  Filled 2013-04-06: qty 4

## 2013-04-06 MED ORDER — DIPHENHYDRAMINE HCL 50 MG/ML IJ SOLN
INTRAMUSCULAR | Status: AC
  Filled 2013-04-06: qty 1

## 2013-04-06 NOTE — Progress Notes (Signed)
  Subjective: Does feel slightly better this morning but still weak Only mild pain in the RUQ  Objective: Vital signs in last 24 hours: Temp:  [97.9 F (36.6 C)-99.4 F (37.4 C)] 98 F (36.7 C) (01/08 0528) Pulse Rate:  [78-113] 78 (01/08 0528) Resp:  [20-24] 20 (01/08 0528) BP: (94-155)/(43-74) 155/74 mmHg (01/08 0528) SpO2:  [95 %-100 %] 98 % (01/08 0528) Weight:  [230 lb 9.6 oz (104.6 kg)] 230 lb 9.6 oz (104.6 kg) (01/07 2215)    Intake/Output from previous day: 01/07 0701 - 01/08 0700 In: 517.5 [I.V.:517.5] Out: 600 [Urine:600] Intake/Output this shift:    Slightly jaundiced but comfortable in appearance Abdomen soft with minimal tenderness Lab Results:   Recent Labs  04/05/13 1530 04/06/13 0540  WBC 11.3* 12.7*  HGB 13.7 11.3*  HCT 42.4 35.5*  PLT 154 114*   BMET  Recent Labs  04/05/13 1530 04/06/13 0540  NA 139 134*  K 4.5 5.2  CL 100 101  CO2 23 19  GLUCOSE 126* 252*  BUN 31* 34*  CREATININE 1.79* 1.95*  CALCIUM 9.5 8.6   PT/INR No results found for this basename: LABPROT, INR,  in the last 72 hours ABG No results found for this basename: PHART, PCO2, PO2, HCO3,  in the last 72 hours  Studies/Results: US Abdomen Complete  04/05/2013   CLINICAL DATA:  Right upper quadrant pain. Elevated liver function tests. Elevated renal function tests. Known gallstones.  EXAM: ULTRASOUND ABDOMEN COMPLETE  COMPARISON:  Ultrasound dated 08/18/2012  FINDINGS: Gallbladder:  There are multiple mobile stones in the gallbladder. Gallbladder wall is thickened and edematous. Negative sonographic Murphy's sign.  Common bile duct:  Diameter: 7.4 mm, normal.  Liver:  Hepatomegaly with diffuse increased echogenicity consistent with hepatic steatosis.  IVC:  Not seen.  Pancreas:  The pancreatic duct is dilated to a diameter of 6 mm with a stone in the pancreatic duct. Prior CT scan demonstrated diffuse pancreatic atrophy with a stone in the proximal duct with distal dilatation.   Spleen:  15.6 cm.  762 cc volume.  Right Kidney:  Length: 8.7 cm. Echogenicity within normal limits. No mass or hydronephrosis visualized.  Left Kidney:  Length: 9.9 cm. Echogenicity within normal limits. No mass or hydronephrosis visualized.  Abdominal aorta:  Obscured.  Other findings:  None.  IMPRESSION: 1. Chronic cholelithiasis.  New edema of the gallbladder wall. 2. 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 3. Hepatomegaly with hepatic steatosis.   Electronically Signed   By: [name] M.D.   On: 04/05/2013 19:20    Anti-infectives: Anti-infectives   Start     Dose/Rate Route Frequency Ordered Stop   04/06/13 0000  cefOXitin (MEFOXIN) 2 g in dextrose 5 % 50 mL IVPB     2 g 100 mL/hr over 30 Minutes Intravenous 4 times per day 04/05/13 2233        Assessment/Plan: s/p * No surgery found *  Cholelithiasis with possible cholecystitis, PD stone, and possible CBD stone  LFT's up further today  MRCP pending Will have Dr. Benson Norway notified of patient's admission.  May need an ERCP is stone is seen in the CBD Will bolus IVF as creatinine up  LOS: 1 day    Jocelyn Lowery A 04/06/2013

## 2013-04-06 NOTE — Consult Note (Signed)
Reason for Consult: Abnormal liver enzymes, ? Choledocholithiasis Referring Physician: CCS  [name] HPI: This is a 63 year old female admitted for RUQ pain.  She was previously evaluated in the ER at [facility] and noted to have abnormalities in the distal CBD versus a PD stone.  I evaluated the patient last Friday with an EUS and I was not able to completely discern if there was a distal CBD stone, pancreatic head calcification, or PD stone.  Before proceeding with an ERCP I opted to pursue an MRCP.  Before the MRCP could be performed she represented to the ER with complaints of abdominal pain.  The ultrasound reveals a thickened gallbladder wall and multiple gallstones.  The CBD was 7.4 mm and her TB has increased up to the 4 range.  Her WBC has also increased.    Past Medical History  Diagnosis Date  . Alkaline phosphatase elevation   . Chest pain     03-17-2013 last chest pain  . Fatigue   . Diabetes mellitus type II   . Retinopathy   . Peripheral neuropathy   . Neuropathy   . Hypothyroidism   . Hyperlipidemia   . GERD (gastroesophageal reflux disease)   . Cataract     both eyes hx of  . Gout   . Anemia   . Depression   . Headache(784.0)     occasional  . Chronic kidney disease     Stage III kidney disease  . ASD (atrial septal defect)     Past Surgical History  Procedure Laterality Date  . Combined hysterectomy vaginal / oophorectomy / a&p repair  1987    Unilateral oophorectomy, h/o uterine prolapse has right ovary  . Cervical spine surgery  2006  . Tonsillectomy  age 56  . Spinal fusion    . Eus N/A 03/31/2013    Procedure: UPPER ENDOSCOPIC ULTRASOUND (EUS) LINEAR;  Surgeon: Beryle Beams, MD;  Location: [facility];  Service: Endoscopy;  Laterality: N/A;    Family History  Problem Relation Age of Onset  . COPD Mother   . Lung cancer Father   . Diabetes Sister     Social History:  reports that she has never smoked. She has never used smokeless  tobacco. She reports that she does not drink alcohol or use illicit drugs.  Allergies:  Allergies  Allergen Reactions  . Statins     REACTION: diarrhea  . Dilaudid [Hydromorphone Hcl] Nausea And Vomiting    After one vomiting episode no further vomiting    Medications:  Scheduled: . allopurinol  100 mg Oral QPM  . cefOXitin  2 g Intravenous Q6H  . heparin  5,000 Units Subcutaneous Q8H  . insulin aspart  0-20 Units Subcutaneous TID WC  . insulin aspart  0-5 Units Subcutaneous QHS  . levothyroxine  150 mcg Oral QAC breakfast  . pantoprazole  40 mg Oral Daily  . senna  1 tablet Oral BID  . sodium chloride  1,000 mL Intravenous Once   Continuous: . sodium chloride 75 mL/hr at 04/05/13 2306    Results for orders placed during the hospital encounter of 04/05/13 (from the past 24 hour(s))  CBC     Status: Abnormal   Collection Time    04/05/13  3:30 PM      Result Value Range   WBC 11.3 (*) 4.0 - 10.5 K/uL   RBC 4.51  3.87 - 5.11 MIL/uL   Hemoglobin 13.7  12.0 - 15.0  g/dL   HCT 42.4  36.0 - 46.0 %   MCV 94.0  78.0 - 100.0 fL   MCH 30.4  26.0 - 34.0 pg   MCHC 32.3  30.0 - 36.0 g/dL   RDW 15.3  11.5 - 15.5 %   Platelets 154  150 - 400 K/uL  COMPREHENSIVE METABOLIC PANEL     Status: Abnormal   Collection Time    04/05/13  3:30 PM      Result Value Range   Sodium 139  137 - 147 mEq/L   Potassium 4.5  3.7 - 5.3 mEq/L   Chloride 100  96 - 112 mEq/L   CO2 23  19 - 32 mEq/L   Glucose, Bld 126 (*) 70 - 99 mg/dL   BUN 31 (*) 6 - 23 mg/dL   Creatinine, Ser 1.79 (*) 0.50 - 1.10 mg/dL   Calcium 9.5  8.4 - 10.5 mg/dL   Total Protein 7.7  6.0 - 8.3 g/dL   Albumin 3.4 (*) 3.5 - 5.2 g/dL   AST 248 (*) 0 - 37 U/L   ALT 106 (*) 0 - 35 U/L   Alkaline Phosphatase 293 (*) 39 - 117 U/L   Total Bilirubin 2.8 (*) 0.3 - 1.2 mg/dL   GFR calc non Af Amer 29 (*) >90 mL/min   GFR calc Af Amer 34 (*) >90 mL/min  LIPASE, BLOOD     Status: Abnormal   Collection Time    04/05/13  3:30 PM       Result Value Range   Lipase 9 (*) 11 - 59 U/L  URINALYSIS, ROUTINE W REFLEX MICROSCOPIC     Status: Abnormal   Collection Time    04/05/13  5:24 PM      Result Value Range   Color, Urine AMBER (*) YELLOW   APPearance CLEAR  CLEAR   Specific Gravity, Urine 1.022  1.005 - 1.030   pH 5.5  5.0 - 8.0   Glucose, UA NEGATIVE  NEGATIVE mg/dL   Hgb urine dipstick NEGATIVE  NEGATIVE   Bilirubin Urine MODERATE (*) NEGATIVE   Ketones, ur NEGATIVE  NEGATIVE mg/dL   Protein, ur NEGATIVE  NEGATIVE mg/dL   Urobilinogen, UA 4.0 (*) 0.0 - 1.0 mg/dL   Nitrite NEGATIVE  NEGATIVE   Leukocytes, UA NEGATIVE  NEGATIVE  SURGICAL PCR SCREEN     Status: None   Collection Time    04/05/13 11:04 PM      Result Value Range   MRSA, PCR NEGATIVE  NEGATIVE   Staphylococcus aureus NEGATIVE  NEGATIVE  GLUCOSE, CAPILLARY     Status: Abnormal   Collection Time    04/06/13  4:27 AM      Result Value Range   Glucose-Capillary 226 (*) 70 - 99 mg/dL  COMPREHENSIVE METABOLIC PANEL     Status: Abnormal   Collection Time    04/06/13  5:40 AM      Result Value Range   Sodium 134 (*) 137 - 147 mEq/L   Potassium 5.2  3.7 - 5.3 mEq/L   Chloride 101  96 - 112 mEq/L   CO2 19  19 - 32 mEq/L   Glucose, Bld 252 (*) 70 - 99 mg/dL   BUN 34 (*) 6 - 23 mg/dL   Creatinine, Ser 1.95 (*) 0.50 - 1.10 mg/dL   Calcium 8.6  8.4 - 10.5 mg/dL   Total Protein 6.4  6.0 - 8.3 g/dL   Albumin 2.7 (*) 3.5 - 5.2 g/dL  AST 159 (*) 0 - 37 U/L   ALT 91 (*) 0 - 35 U/L   Alkaline Phosphatase 249 (*) 39 - 117 U/L   Total Bilirubin 4.2 (*) 0.3 - 1.2 mg/dL   GFR calc non Af Amer 27 (*) >90 mL/min   GFR calc Af Amer 31 (*) >90 mL/min  CBC     Status: Abnormal   Collection Time    04/06/13  5:40 AM      Result Value Range   WBC 12.7 (*) 4.0 - 10.5 K/uL   RBC 3.80 (*) 3.87 - 5.11 MIL/uL   Hemoglobin 11.3 (*) 12.0 - 15.0 g/dL   HCT 35.5 (*) 36.0 - 46.0 %   MCV 93.4  78.0 - 100.0 fL   MCH 29.7  26.0 - 34.0 pg   MCHC 31.8  30.0 - 36.0 g/dL    RDW 15.6 (*) 11.5 - 15.5 %   Platelets 114 (*) 150 - 400 K/uL     US Abdomen Complete  04/05/2013   CLINICAL DATA:  Right upper quadrant pain. Elevated liver function tests. Elevated renal function tests. Known gallstones.  EXAM: ULTRASOUND ABDOMEN COMPLETE  COMPARISON:  Ultrasound dated 08/18/2012  FINDINGS: Gallbladder:  There are multiple mobile stones in the gallbladder. Gallbladder wall is thickened and edematous. Negative sonographic Murphy's sign.  Common bile duct:  Diameter: 7.4 mm, normal.  Liver:  Hepatomegaly with diffuse increased echogenicity consistent with hepatic steatosis.  IVC:  Not seen.  Pancreas:  The pancreatic duct is dilated to a diameter of 6 mm with a stone in the pancreatic duct. Prior CT scan demonstrated diffuse pancreatic atrophy with a stone in the proximal duct with distal dilatation.  Spleen:  15.6 cm.  762 cc volume.  Right Kidney:  Length: 8.7 cm. Echogenicity within normal limits. No mass or hydronephrosis visualized.  Left Kidney:  Length: 9.9 cm. Echogenicity within normal limits. No mass or hydronephrosis visualized.  Abdominal aorta:  Obscured.  Other findings:  None.  IMPRESSION: 1. Chronic cholelithiasis.  New edema of the gallbladder wall. 2. 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 3. Hepatomegaly with hepatic steatosis.   Electronically Signed   By: [name] M.D.   On: 04/05/2013 19:20    ROS:  As stated above in the HPI otherwise negative.  Blood pressure 155/74, pulse 78, temperature 98 F (36.7 C), temperature source Oral, resp. rate 20, height 5' 6"  (1.676 m), weight 230 lb 9.6 oz (104.6 kg), SpO2 98.00%.    PE: Gen: NAD, Alert and Oriented HEENT:  [place]/AT, EOMI Neck: Supple, no LAD Lungs: CTA Bilaterally CV: RRR without M/G/R ABM: Soft, NTND, +BS Ext: No C/C/E  Assessment/Plan: 1) Abnormal liver enzymes. 2) ? Choledocholithiasis. 3) ? Acute cholecystitis.   I  reviewed the current blood work and imaging scans.  With the EUS the CBD diameter was normal, but I was not able to discern if there was a distal CBD stone.  Some images suggested a CBD stone, but others did not reveal a stone.  An MRCP was to be pursued, but she represented with abdominal pain.  Her TB dropped as an outpatient and then it increased.  She does exhibit an obstructive pattern for her liver enzymes.  Plan: 1) ERCP today.  Jareb Radoncic D 04/06/2013, 8:17 AM

## 2013-04-06 NOTE — Care Management Note (Signed)
    Page 1 of 1   04/06/2013     11:43:45 AM   CARE MANAGEMENT NOTE 04/06/2013  Patient:  [name], [name]   Account Number:  [id number]  Date Initiated:  04/06/2013  Documentation initiated by:  Sunday Spillers  Subjective/Objective Assessment:   63 yo female admitted with elevated LFT's, acute on chronic cholecystitis. PTA lived at home with son.     Action/Plan:   Home when stable   Anticipated DC Date:  04/10/2013   Anticipated DC Plan:  [place]  CM consult      Choice offered to / List presented to:             Status of service:  Completed, signed off Medicare Important Message given?   (If response is "NO", the following Medicare IM given date fields will be blank) Date Medicare IM given:   Date Additional Medicare IM given:    Discharge Disposition:  HOME/SELF CARE  Per UR Regulation:  Reviewed for med. necessity/level of care/duration of stay  If discussed at [place] of Stay Meetings, dates discussed:    Comments:

## 2013-04-06 NOTE — Op Note (Signed)
[facility] [place], [address]   ERCP PROCEDURE REPORT  PATIENT: [name], [name].  MR# :[id number] BIRTHDATE: 20-Mar-1951  GENDER: Female ENDOSCOPIST: Carol Ada, MD REFERRED BY: CCS PROCEDURE DATE:  04/06/2013 PROCEDURE:   ERCP with stone extraction ASA CLASS:   II INDICATIONS: Possible choledocholithiasis MEDICATIONS: Versed 6 mg IV and Fentanyl 75 mcg IV TOPICAL ANESTHETIC: Cetacaine Spray  DESCRIPTION OF PROCEDURE:   After the risks benefits and alternatives of the procedure were thoroughly explained, informed consent was obtained.  The     endoscope was introduced through the mouth  and advanced to the second portion of the duodenum.  There was spontaneous drainage of bile and pancreatic juices. Cannulation CBD was achieved with relative ease after a brief cannulation of the PD.  The guidewire was secured in the right intrahepatic ducts.  Contrast injection revealed an upper limit of normal sized CBD, but no clear stone.  However, with the possibility of a small stone not visible with contrast a small sphincterotomy was created.  The initial sweep of the CBD was negative for any stone extraction, but the second sweep produced a small friable stone.  The stone broke into fragments with manipulation.  The thrid sweep, which was the occlusion cholangiogram was negative for any retained stones.  The scope was then completely withdrawn from the patient and the procedure terminated.     COMPLICATIONS:  ENDOSCOPIC IMPRESSION: 1) Choledocholithiasis.  RECOMMENDATIONS: 1) Lap chole per Surgery.    _______________________________ eSignedCarol Ada, MD 04/06/2013 2:37 PM   CC:

## 2013-04-06 NOTE — Progress Notes (Signed)
INITIAL NUTRITION ASSESSMENT  DOCUMENTATION CODES Per approved criteria  -Obesity Unspecified   INTERVENTION: - Diet advancement per MD - Discussed ways to improve appetite and importance of getting on a regular meal schedule at home to promote good glucose control - Will continue to monitor   NUTRITION DIAGNOSIS: Inadequate oral intake related to inability to eat as evidenced by NPO.   Goal: Advance diet as tolerated to diabetic diet  Monitor:  Weights, labs, diet advancement  Reason for Assessment: Malnutrition screening tool   63 y.o. female  Admitting Dx: RUQ pain   ASSESSMENT: Admitted with RUQ pain after eating breakfast. Hx of type 2 DM, hypothyroidism, GERD, hyperlipidemia, anima, gout, stage 3 CKD, and depression.   Met with pt who reports typically eating 3-4 meals/day at home and follows a diabetic diet however states she's not been eating well since Christmas due to not having an appetite. Had 1 episode of vomiting yesterday but denies any nausea/vomiting today. Reports losing 5 pounds in the past 5 days. Denies any RUQ pain currently.   BUN/Cr elevated with low GFR Alk phos elevated AST/ALT and total bilirubin elevated   Height: Ht Readings from Last 1 Encounters:  04/05/13 5' 6"  (1.676 m)    Weight: Wt Readings from Last 1 Encounters:  04/05/13 230 lb 9.6 oz (104.6 kg)    Ideal Body Weight: 130 lb   % Ideal Body Weight: 177%  Wt Readings from Last 10 Encounters:  04/05/13 230 lb 9.6 oz (104.6 kg)  04/05/13 230 lb 9.6 oz (104.6 kg)  03/15/13 238 lb (107.956 kg)  02/25/10 255 lb (115.667 kg)  02/05/10 269 lb (122.018 kg)  11/13/08 248 lb (112.492 kg)  07/31/08 222 lb (100.699 kg)  05/01/08 220 lb 12 oz (100.132 kg)  03/20/08 212 lb 4 oz (96.276 kg)  02/21/08 213 lb 8 oz (96.843 kg)    Usual Body Weight: 234-235 lb per pt  % Usual Body Weight: 98%  BMI:  Body mass index is 37.24 kg/(m^2). Class II obesity  Estimated Nutritional  Needs: Kcal: 4098-1191 Protein: 60-70g Fluid: 1.6-1.8L/day  Skin: Intact   Diet Order: NPO  EDUCATION NEEDS: -No education needs identified at this time   Intake/Output Summary (Last 24 hours) at 04/06/13 1129 Last data filed at 04/06/13 1000  Gross per 24 hour  Intake  517.5 ml  Output    600 ml  Net  -82.5 ml    Last BM: PTA  Labs:   Recent Labs Lab 04/05/13 1530 04/06/13 0540  NA 139 134*  K 4.5 5.2  CL 100 101  CO2 23 19  BUN 31* 34*  CREATININE 1.79* 1.95*  CALCIUM 9.5 8.6  GLUCOSE 126* 252*    CBG (last 3)   Recent Labs  04/06/13 0427 04/06/13 0822  GLUCAP 226* 214*    Scheduled Meds: . allopurinol  100 mg Oral QPM  . cefOXitin  2 g Intravenous Q6H  . heparin  5,000 Units Subcutaneous Q8H  . insulin aspart  0-20 Units Subcutaneous TID WC  . insulin aspart  0-5 Units Subcutaneous QHS  . levothyroxine  150 mcg Oral QAC breakfast  . pantoprazole  40 mg Oral Daily  . senna  1 tablet Oral BID    Continuous Infusions: . sodium chloride 75 mL/hr at 04/05/13 2306    Past Medical History  Diagnosis Date  . Alkaline phosphatase elevation   . Chest pain     03-17-2013 last chest pain  . Fatigue   .  Diabetes mellitus type II   . Retinopathy   . Peripheral neuropathy   . Neuropathy   . Hypothyroidism   . Hyperlipidemia   . GERD (gastroesophageal reflux disease)   . Cataract     both eyes hx of  . Gout   . Anemia   . Depression   . Headache(784.0)     occasional  . Chronic kidney disease     Stage III kidney disease  . ASD (atrial septal defect)     Past Surgical History  Procedure Laterality Date  . Combined hysterectomy vaginal / oophorectomy / a&p repair  1987    Unilateral oophorectomy, h/o uterine prolapse has right ovary  . Cervical spine surgery  2006  . Tonsillectomy  age 75  . Spinal fusion    . Eus N/A 03/31/2013    Procedure: UPPER ENDOSCOPIC ULTRASOUND (EUS) LINEAR;  Surgeon: Beryle Beams, MD;  Location: [facility];   Service: Endoscopy;  Laterality: N/A;    Mikey College MS, [place], [place] Pager [phone] After Hours Pager

## 2013-04-06 NOTE — Progress Notes (Signed)
Patient received a total of 6 mg Versed and 75 mcg Fentanyl for sedation during her ERCP procedure.

## 2013-04-07 ENCOUNTER — Inpatient Hospital Stay (HOSPITAL_COMMUNITY): Payer: Medicare Other

## 2013-04-07 ENCOUNTER — Encounter (HOSPITAL_COMMUNITY): Admission: EM | Disposition: A | Discharge status: Home / Self Care | Payer: Self-pay | Source: Home / Self Care

## 2013-04-07 ENCOUNTER — Encounter (HOSPITAL_COMMUNITY): Payer: Self-pay | Admitting: Gastroenterology

## 2013-04-07 ENCOUNTER — Inpatient Hospital Stay (HOSPITAL_COMMUNITY): Payer: Medicare Other | Admitting: Anesthesiology

## 2013-04-07 ENCOUNTER — Encounter (HOSPITAL_COMMUNITY): Payer: Medicare Other | Admitting: Anesthesiology

## 2013-04-07 HISTORY — PX: CHOLECYSTECTOMY: SHX55

## 2013-04-07 LAB — CBC WITH DIFFERENTIAL/PLATELET
BASOS ABS: 0 10*3/uL (ref 0.0–0.1)
BASOS PCT: 0 % (ref 0–1)
EOS ABS: 0.1 10*3/uL (ref 0.0–0.7)
Eosinophils Relative: 2 % (ref 0–5)
HCT: 32.6 % — ABNORMAL LOW (ref 36.0–46.0)
HEMOGLOBIN: 10.8 g/dL — AB (ref 12.0–15.0)
Lymphocytes Relative: 13 % (ref 12–46)
Lymphs Abs: 0.8 10*3/uL (ref 0.7–4.0)
MCH: 30.7 pg (ref 26.0–34.0)
MCHC: 33.1 g/dL (ref 30.0–36.0)
MCV: 92.6 fL (ref 78.0–100.0)
MONO ABS: 0.6 10*3/uL (ref 0.1–1.0)
MONOS PCT: 9 % (ref 3–12)
NEUTROS ABS: 4.6 10*3/uL (ref 1.7–7.7)
NEUTROS PCT: 76 % (ref 43–77)
Platelets: 86 10*3/uL — ABNORMAL LOW (ref 150–400)
RBC: 3.52 MIL/uL — ABNORMAL LOW (ref 3.87–5.11)
RDW: 16 % — AB (ref 11.5–15.5)
WBC: 6.1 10*3/uL (ref 4.0–10.5)

## 2013-04-07 LAB — GLUCOSE, CAPILLARY
GLUCOSE-CAPILLARY: 229 mg/dL — AB (ref 70–99)
GLUCOSE-CAPILLARY: 153 mg/dL — AB (ref 70–99)
GLUCOSE-CAPILLARY: 181 mg/dL — AB (ref 70–99)
Glucose-Capillary: 174 mg/dL — ABNORMAL HIGH (ref 70–99)
Glucose-Capillary: 186 mg/dL — ABNORMAL HIGH (ref 70–99)

## 2013-04-07 LAB — COMPREHENSIVE METABOLIC PANEL
ALT: 59 U/L — ABNORMAL HIGH (ref 0–35)
AST: 69 U/L — AB (ref 0–37)
Albumin: 2.3 g/dL — ABNORMAL LOW (ref 3.5–5.2)
Alkaline Phosphatase: 223 U/L — ABNORMAL HIGH (ref 39–117)
BUN: 25 mg/dL — AB (ref 6–23)
CHLORIDE: 101 meq/L (ref 96–112)
CO2: 20 mEq/L (ref 19–32)
CREATININE: 1.69 mg/dL — AB (ref 0.50–1.10)
Calcium: 8.4 mg/dL (ref 8.4–10.5)
GFR calc Af Amer: 37 mL/min — ABNORMAL LOW (ref >90)
GFR calc non Af Amer: 32 mL/min — ABNORMAL LOW (ref >90)
Glucose, Bld: 217 mg/dL — ABNORMAL HIGH (ref 70–99)
Potassium: 5.2 mEq/L (ref 3.7–5.3)
Sodium: 132 mEq/L — ABNORMAL LOW (ref 137–147)
TOTAL PROTEIN: 6 g/dL (ref 6.0–8.3)
Total Bilirubin: 5.1 mg/dL — ABNORMAL HIGH (ref 0.3–1.2)

## 2013-04-07 LAB — LIPASE, BLOOD: LIPASE: 6 U/L — AB (ref 11–59)

## 2013-04-07 SURGERY — LAPAROSCOPIC CHOLECYSTECTOMY WITH INTRAOPERATIVE CHOLANGIOGRAM
Anesthesia: General | Site: Abdomen

## 2013-04-07 MED ORDER — DEXAMETHASONE SODIUM PHOSPHATE 10 MG/ML IJ SOLN
INTRAMUSCULAR | Status: AC
  Filled 2013-04-07: qty 1

## 2013-04-07 MED ORDER — ROCURONIUM BROMIDE 100 MG/10ML IV SOLN
INTRAVENOUS | Status: AC
  Filled 2013-04-07: qty 1

## 2013-04-07 MED ORDER — LIDOCAINE HCL (CARDIAC) 20 MG/ML IV SOLN
INTRAVENOUS | Status: AC
  Filled 2013-04-07: qty 5

## 2013-04-07 MED ORDER — BUPIVACAINE-EPINEPHRINE 0.25% -1:200000 IJ SOLN
INTRAMUSCULAR | Status: DC | PRN
  Administered 2013-04-07: 30 mL

## 2013-04-07 MED ORDER — PHENYLEPHRINE HCL 10 MG/ML IJ SOLN
INTRAMUSCULAR | Status: AC
  Filled 2013-04-07: qty 1

## 2013-04-07 MED ORDER — FENTANYL CITRATE 0.05 MG/ML IJ SOLN
INTRAMUSCULAR | Status: AC
  Filled 2013-04-07: qty 2

## 2013-04-07 MED ORDER — DEXTROSE 5 % IV SOLN
INTRAVENOUS | Status: AC
  Filled 2013-04-07 (×2): qty 1

## 2013-04-07 MED ORDER — ONDANSETRON HCL 4 MG/2ML IJ SOLN
INTRAMUSCULAR | Status: AC
  Filled 2013-04-07: qty 2

## 2013-04-07 MED ORDER — GLYCOPYRROLATE 0.2 MG/ML IJ SOLN
INTRAMUSCULAR | Status: DC | PRN
  Administered 2013-04-07: .8 mg via INTRAVENOUS

## 2013-04-07 MED ORDER — BUPIVACAINE-EPINEPHRINE PF 0.25-1:200000 % IJ SOLN
INTRAMUSCULAR | Status: AC
  Filled 2013-04-07: qty 30

## 2013-04-07 MED ORDER — IOHEXOL 300 MG/ML  SOLN
INTRAMUSCULAR | Status: DC | PRN
Start: 2013-04-07 — End: 2013-04-07
  Administered 2013-04-07: 15 mL

## 2013-04-07 MED ORDER — GLYCOPYRROLATE 0.2 MG/ML IJ SOLN
INTRAMUSCULAR | Status: AC
  Filled 2013-04-07: qty 3

## 2013-04-07 MED ORDER — FENTANYL CITRATE 0.05 MG/ML IJ SOLN
INTRAMUSCULAR | Status: AC
  Filled 2013-04-07: qty 5

## 2013-04-07 MED ORDER — FENTANYL CITRATE 0.05 MG/ML IJ SOLN
INTRAMUSCULAR | Status: DC | PRN
  Administered 2013-04-07 (×2): 50 ug via INTRAVENOUS
  Administered 2013-04-07: 100 ug via INTRAVENOUS
  Administered 2013-04-07: 50 ug via INTRAVENOUS
  Administered 2013-04-07: 100 ug via INTRAVENOUS
  Administered 2013-04-07: 50 ug via INTRAVENOUS

## 2013-04-07 MED ORDER — SUCCINYLCHOLINE CHLORIDE 20 MG/ML IJ SOLN
INTRAMUSCULAR | Status: DC | PRN
  Administered 2013-04-07: 100 mg via INTRAVENOUS

## 2013-04-07 MED ORDER — MIDAZOLAM HCL 2 MG/2ML IJ SOLN
INTRAMUSCULAR | Status: AC
  Filled 2013-04-07: qty 2

## 2013-04-07 MED ORDER — PROMETHAZINE HCL 25 MG/ML IJ SOLN
INTRAMUSCULAR | Status: AC
Start: 2013-04-07 — End: 2013-04-08
  Filled 2013-04-07: qty 1

## 2013-04-07 MED ORDER — PROPOFOL 10 MG/ML IV BOLUS
INTRAVENOUS | Status: DC | PRN
  Administered 2013-04-07: 180 mg via INTRAVENOUS

## 2013-04-07 MED ORDER — LACTATED RINGERS IV SOLN: INTRAVENOUS | Status: DC | PRN

## 2013-04-07 MED ORDER — EPHEDRINE SULFATE 50 MG/ML IJ SOLN
INTRAMUSCULAR | Status: AC
  Filled 2013-04-07: qty 1

## 2013-04-07 MED ORDER — SODIUM CHLORIDE 0.9 % IV SOLN
INTRAVENOUS | Status: DC
  Administered 2013-04-07 (×2): via INTRAVENOUS

## 2013-04-07 MED ORDER — LIDOCAINE HCL (CARDIAC) 20 MG/ML IV SOLN
INTRAVENOUS | Status: DC | PRN
  Administered 2013-04-07: 25 mg via INTRAVENOUS
  Administered 2013-04-07: 50 mg via INTRAVENOUS

## 2013-04-07 MED ORDER — FENTANYL CITRATE 0.05 MG/ML IJ SOLN
25.0000 ug | INTRAMUSCULAR | Status: DC | PRN
  Administered 2013-04-07 (×4): 25 ug via INTRAVENOUS
  Filled 2013-04-07: qty 2

## 2013-04-07 MED ORDER — CISATRACURIUM BESYLATE (PF) 10 MG/5ML IV SOLN
INTRAVENOUS | Status: DC | PRN
  Administered 2013-04-07: 1 mg via INTRAVENOUS
  Administered 2013-04-07: 6 mg via INTRAVENOUS
  Administered 2013-04-07: 1 mg via INTRAVENOUS

## 2013-04-07 MED ORDER — NEOSTIGMINE METHYLSULFATE 1 MG/ML IJ SOLN
INTRAMUSCULAR | Status: DC | PRN
  Administered 2013-04-07: 5 mg via INTRAVENOUS

## 2013-04-07 MED ORDER — PROMETHAZINE HCL 25 MG/ML IJ SOLN
6.2500 mg | INTRAMUSCULAR | Status: DC | PRN
  Administered 2013-04-07: 6.25 mg via INTRAVENOUS

## 2013-04-07 MED ORDER — MEPERIDINE HCL 50 MG/ML IJ SOLN: 6.2500 mg | INTRAMUSCULAR | Status: DC | PRN

## 2013-04-07 MED ORDER — SODIUM CHLORIDE 0.9 % IJ SOLN
INTRAMUSCULAR | Status: AC
  Filled 2013-04-07: qty 10

## 2013-04-07 MED ORDER — LACTATED RINGERS IR SOLN
Status: DC | PRN
  Administered 2013-04-07: 1

## 2013-04-07 MED ORDER — ONDANSETRON HCL 4 MG/2ML IJ SOLN
INTRAMUSCULAR | Status: DC | PRN
  Administered 2013-04-07: 4 mg via INTRAVENOUS

## 2013-04-07 MED ORDER — NEOSTIGMINE METHYLSULFATE 1 MG/ML IJ SOLN
INTRAMUSCULAR | Status: AC
  Filled 2013-04-07: qty 10

## 2013-04-07 MED ORDER — LIDOCAINE HCL (CARDIAC) 20 MG/ML IV SOLN
INTRAVENOUS | Status: AC
Start: 2013-04-07 — End: 2013-04-07
  Filled 2013-04-07: qty 5

## 2013-04-07 MED ORDER — MIDAZOLAM HCL 5 MG/5ML IJ SOLN
INTRAMUSCULAR | Status: DC | PRN
  Administered 2013-04-07 (×2): 1 mg via INTRAVENOUS

## 2013-04-07 MED ORDER — PROPOFOL 10 MG/ML IV BOLUS
INTRAVENOUS | Status: AC
  Filled 2013-04-07: qty 20

## 2013-04-07 SURGICAL SUPPLY — 37 items
ADH SKN CLS APL DERMABOND .7 (GAUZE/BANDAGES/DRESSINGS) ×1
APPLIER CLIP ROT 10 11.4 M/L (STAPLE) ×2
APR CLP MED LRG 11.4X10 (STAPLE) ×1
BAG SPEC RTRVL LRG 6X4 10 (ENDOMECHANICALS)
CANISTER SUCTION 2500CC (MISCELLANEOUS) ×2 IMPLANT
CATH REDDICK CHOLANGI 4FR 50CM (CATHETERS) ×2 IMPLANT
CHLORAPREP W/TINT 26ML (MISCELLANEOUS) ×3 IMPLANT
CLIP APPLIE ROT 10 11.4 M/L (STAPLE) ×1 IMPLANT
COVER MAYO STAND STRL (DRAPES) ×2 IMPLANT
DECANTER SPIKE VIAL GLASS SM (MISCELLANEOUS) ×2 IMPLANT
DERMABOND ADVANCED (GAUZE/BANDAGES/DRESSINGS) ×1
DERMABOND ADVANCED .7 DNX12 (GAUZE/BANDAGES/DRESSINGS) ×1 IMPLANT
DRAPE C-ARM 42X120 X-RAY (DRAPES) ×2 IMPLANT
DRAPE LAPAROSCOPIC ABDOMINAL (DRAPES) ×2 IMPLANT
ELECT REM PT RETURN 9FT ADLT (ELECTROSURGICAL) ×2
ELECTRODE REM PT RTRN 9FT ADLT (ELECTROSURGICAL) ×1 IMPLANT
ENDOLOOP SUT PDS II  0 18 (SUTURE) ×1
ENDOLOOP SUT PDS II 0 18 (SUTURE) IMPLANT
GLOVE BIO SURGEON STRL SZ7.5 (GLOVE) ×4 IMPLANT
GOWN STRL REUS W/ TWL XL LVL3 (GOWN DISPOSABLE) IMPLANT
GOWN STRL REUS W/TWL LRG LVL3 (GOWN DISPOSABLE) ×2 IMPLANT
GOWN STRL REUS W/TWL XL LVL3 (GOWN DISPOSABLE) ×2 IMPLANT
HEMOSTAT SURGICEL 4X8 (HEMOSTASIS) ×2 IMPLANT
IV CATH 14GX2 1/4 (CATHETERS) ×2 IMPLANT
KIT BASIN OR (CUSTOM PROCEDURE TRAY) ×2 IMPLANT
POUCH SPECIMEN RETRIEVAL 10MM (ENDOMECHANICALS) IMPLANT
SET IRRIG TUBING LAPAROSCOPIC (IRRIGATION / IRRIGATOR) ×2 IMPLANT
SOLUTION ANTI FOG 6CC (MISCELLANEOUS) ×2 IMPLANT
SUT MNCRL AB 4-0 PS2 18 (SUTURE) ×2 IMPLANT
TOWEL OR 17X26 10 PK STRL BLUE (TOWEL DISPOSABLE) ×2 IMPLANT
TOWEL OR NON WOVEN STRL DISP B (DISPOSABLE) ×2 IMPLANT
TRAY LAP CHOLE (CUSTOM PROCEDURE TRAY) ×2 IMPLANT
TROCAR BLADELESS OPT 5 75 (ENDOMECHANICALS) ×2 IMPLANT
TROCAR SLEEVE XCEL 5X75 (ENDOMECHANICALS) ×2 IMPLANT
TROCAR XCEL BLUNT TIP 100MML (ENDOMECHANICALS) ×2 IMPLANT
TROCAR XCEL NON-BLD 11X100MML (ENDOMECHANICALS) ×2 IMPLANT
TUBING INSUFFLATION 10FT LAP (TUBING) ×2 IMPLANT

## 2013-04-07 NOTE — Progress Notes (Signed)
Subjective: RUQ pain after the ERCP resolved with one shot of morphine.  Feels well at this time.  Objective: Vital signs in last 24 hours: Temp:  [97.9 F (36.6 C)-99 F (37.2 C)] 99 F (37.2 C) (01/09 0600) Pulse Rate:  [79-98] 85 (01/09 0600) Resp:  [18-27] 18 (01/09 0600) BP: (115-213)/(44-89) 124/72 mmHg (01/09 0600) SpO2:  [92 %-100 %] 95 % (01/09 0600) Last BM Date: 04/06/13  Intake/Output from previous day: 01/08 0701 - 01/09 0700 In: 2533.8 [P.O.:840; I.V.:1693.8] Out: 1950 [Urine:1950] Intake/Output this shift:    General appearance: alert and no distress GI: soft, non-tender; bowel sounds normal; no masses,  no organomegaly  Lab Results:  Recent Labs  04/05/13 1530 04/06/13 0540 04/07/13 0419  WBC 11.3* 12.7* 6.1  HGB 13.7 11.3* 10.8*  HCT 42.4 35.5* 32.6*  PLT 154 114* 86*   BMET  Recent Labs  04/05/13 1530 04/06/13 0540 04/07/13 0419  NA 139 134* 132*  K 4.5 5.2 5.2  CL 100 101 101  CO2 23 19 20   GLUCOSE 126* 252* 217*  BUN 31* 34* 25*  CREATININE 1.79* 1.95* 1.69*  CALCIUM 9.5 8.6 8.4   LFT  Recent Labs  04/07/13 0419  PROT 6.0  ALBUMIN 2.3*  AST 69*  ALT 59*  ALKPHOS 223*  BILITOT 5.1*   PT/INR No results found for this basename: LABPROT, INR,  in the last 72 hours Hepatitis Panel No results found for this basename: HEPBSAG, HCVAB, HEPAIGM, HEPBIGM,  in the last 72 hours C-Diff No results found for this basename: CDIFFTOX,  in the last 72 hours Fecal Lactopherrin No results found for this basename: FECLLACTOFRN,  in the last 72 hours  Studies/Results: US Abdomen Complete  04/05/2013   CLINICAL DATA:  Right upper quadrant pain. Elevated liver function tests. Elevated renal function tests. Known gallstones.  EXAM: ULTRASOUND ABDOMEN COMPLETE  COMPARISON:  Ultrasound dated 08/18/2012  FINDINGS: Gallbladder:  There are multiple mobile stones in the gallbladder. Gallbladder wall is thickened and edematous. Negative sonographic  Murphy's sign.  Common bile duct:  Diameter: 7.4 mm, normal.  Liver:  Hepatomegaly with diffuse increased echogenicity consistent with hepatic steatosis.  IVC:  Not seen.  Pancreas:  The pancreatic duct is dilated to a diameter of 6 mm with a stone in the pancreatic duct. Prior CT scan demonstrated diffuse pancreatic atrophy with a stone in the proximal duct with distal dilatation.  Spleen:  15.6 cm.  762 cc volume.  Right Kidney:  Length: 8.7 cm. Echogenicity within normal limits. No mass or hydronephrosis visualized.  Left Kidney:  Length: 9.9 cm. Echogenicity within normal limits. No mass or hydronephrosis visualized.  Abdominal aorta:  Obscured.  Other findings:  None.  IMPRESSION: 1. Chronic cholelithiasis.  New edema of the gallbladder wall. 2. 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 3. Hepatomegaly with hepatic steatosis.   Electronically Signed   By: [name] M.D.   On: 04/05/2013 19:20   Dg Ercp Biliary & Pancreatic Ducts  04/06/2013   CLINICAL DATA:  ERCP with sphincterotomy  EXAM: ERCP  TECHNIQUE: Multiple spot images obtained with the fluoroscopic device and submitted for interpretation post-procedure.  COMPARISON:  None.  FINDINGS: Single fluoroscopic spot image from ERCP demonstrates a wire within the common bile duct extending into the common hepatic duct. There is contrast present within the common bile duct and common hepatic duct. There is contrast present within the cystic duct. The  distal common bile duct is difficult to characterize secondary to lack of adequate contrast filling the distal CBD.  IMPRESSION: ERCP.  These images were submitted for radiologic interpretation only. Please see the procedural report for the amount of contrast and the fluoroscopy time utilized.   Electronically Signed   By: Kathreen Devoid   On: 04/06/2013 15:22    Medications:  Scheduled: . allopurinol  100 mg Oral QPM  . cefOXitin  2  g Intravenous Q6H  . heparin  5,000 Units Subcutaneous Q8H  . insulin aspart  0-20 Units Subcutaneous TID WC  . insulin aspart  0-5 Units Subcutaneous QHS  . levothyroxine  150 mcg Oral QAC breakfast  . pantoprazole  40 mg Oral Daily  . senna  1 tablet Oral BID   Continuous: . sodium chloride 75 mL/hr at 04/07/13 0055    Assessment/Plan: 1) Choledocholithiasis s/p ERCP with stone extraction. 2) Cholelithiasis - ? Acute cholecystitis.   She is well.  Her pain from the ERCP may have been from the CBD sweeps and dye injection.  She did complain of pain at the time of the procedure when the balloon was inflated.  WBC has decreased as well as AST and ALT.  TB increased, which is not unexpected.  AP still remains elevated.    Plan: 1) Lap chole per Surgery. 2) Signing off.   LOS: 2 days   [name] D 04/07/2013, 9:46 AM

## 2013-04-07 NOTE — Anesthesia Postprocedure Evaluation (Signed)
  Anesthesia Post-op Note  Patient: [name]  Procedure(s) Performed: Procedure(s) (LRB): LAPAROSCOPIC CHOLECYSTECTOMY WITH INTRAOPERATIVE CHOLANGIOGRAM (N/A)  Patient Location: PACU  Anesthesia Type: General  Level of Consciousness: awake and alert   Airway and Oxygen Therapy: Patient Spontanous Breathing  Post-op Pain: mild  Post-op Assessment: Post-op Vital signs reviewed, Patient's Cardiovascular Status Stable, Respiratory Function Stable, Patent Airway and No signs of Nausea or vomiting  Last Vitals:  Filed Vitals:   04/07/13 1815  BP: 128/54  Pulse: 75  Temp: 36.4 C  Resp: 18    Post-op Vital Signs: stable   Complications: No apparent anesthesia complications

## 2013-04-07 NOTE — Anesthesia Preprocedure Evaluation (Addendum)
Anesthesia Evaluation  Patient identified by MRN, date of birth, ID band Patient awake  General Assessment Comment:  Alkaline phosphatase elevation     .  Chest pain         03-17-2013 last chest pain   .  Fatigue     .  Diabetes mellitus type II     .  Retinopathy     .  Peripheral neuropathy     .  Neuropathy     .  Hypothyroidism     .  Hyperlipidemia     .  GERD (gastroesophageal reflux disease)     .  Cataract         both eyes hx of   .  Gout     .  Anemia     .  Depression     .  Headache(784.0)         occasional   .  Chronic kidney disease         Stage III kidney disease   .  ASD (atrial septal defect)          Reviewed: Allergy & Precautions, H&P , NPO status , Patient's Chart, lab work & pertinent test results  Airway Mallampati: II TM Distance: >3 FB Neck ROM: Full    Dental  (+) Dental Advisory Given   Pulmonary neg pulmonary ROS,  breath sounds clear to auscultation        Cardiovascular Exercise Tolerance: Good negative cardio ROS  Rhythm:Regular Rate:Normal  ECHO 2011 reviewed. No ASD seen. EF normal.  ECG: RBBB LVH   Neuro/Psych  Headaches, PSYCHIATRIC DISORDERS Depression  Neuromuscular disease    GI/Hepatic Neg liver ROS, GERD-  Medicated,  Endo/Other  negative endocrine ROSdiabetes, Type 2, Insulin DependentHypothyroidism   Renal/GU Renal Insufficiency and CRFRenal diseaseCr 1.69 K 5.2     Musculoskeletal negative musculoskeletal ROS (+)   Abdominal (+) + obese,   Peds  Hematology  (+) anemia ,   Anesthesia Other Findings   Reproductive/Obstetrics negative OB ROS                       Anesthesia Physical  Anesthesia Plan  ASA: III  Anesthesia Plan: General   Post-op Pain Management:    Induction: Intravenous  Airway Management Planned: Oral ETT  Additional Equipment:   Intra-op Plan:   Post-operative Plan: Extubation in OR  Informed  Consent: I have reviewed the patients History and Physical, chart, labs and discussed the procedure including the risks, benefits and alternatives for the proposed anesthesia with the patient or authorized representative who has indicated his/her understanding and acceptance.   Dental advisory given  Plan Discussed with: CRNA  Anesthesia Plan Comments:       Anesthesia Quick Evaluation

## 2013-04-07 NOTE — Interval H&P Note (Signed)
History and Physical Interval Note:  04/07/2013 2:13 PM  [name]  has presented today for surgery, with the diagnosis of Cholelithiasis  The various methods of treatment have been discussed with the patient and family. After consideration of risks, benefits and other options for treatment, the patient has consented to  Procedure(s): LAPAROSCOPIC CHOLECYSTECTOMY WITH INTRAOPERATIVE CHOLANGIOGRAM (N/A) as a surgical intervention .  The patient's history has been reviewed, patient examined, no change in status, stable for surgery.  I have reviewed the patient's chart and labs.  Questions were answered to the patient's satisfaction.     TOTH III,Tanisia Yokley S

## 2013-04-07 NOTE — Discharge Instructions (Signed)
Laparoscopic Cholecystectomy Laparoscopic cholecystectomy is surgery to remove the gallbladder. The gallbladder is located slightly to the right of center in the abdomen, behind the liver. It is a concentrating and storage sac for the bile produced in the liver. Bile aids in the digestion and absorption of fats. Gallbladder disease (cholecystitis) is an inflammation of your gallbladder. This condition is usually caused by a buildup of gallstones (cholelithiasis) in your gallbladder. Gallstones can block the flow of bile, resulting in inflammation and pain. In severe cases, emergency surgery may be required. When emergency surgery is not required, you will have time to prepare for the procedure. Laparoscopic surgery is an alternative to open surgery. Laparoscopic surgery usually has a shorter recovery time. Your common bile duct may also need to be examined and explored. Your caregiver will discuss this with you if he or she feels this should be done. If stones are found in the common bile duct, they may be removed. LET YOUR CAREGIVER KNOW ABOUT:  Allergies to food or medicine.  Medicines taken, including vitamins, herbs, eyedrops, over-the-counter medicines, and creams.  Use of steroids (by mouth or creams).  Previous problems with anesthetics or numbing medicines.  History of bleeding problems or blood clots.  Previous surgery.  Other health problems, including diabetes and kidney problems.  Possibility of pregnancy, if this applies. RISKS AND COMPLICATIONS All surgery is associated with risks. Some problems that may occur following this procedure include:  Infection.  Damage to the common bile duct, nerves, arteries, veins, or other internal organs such as the stomach or intestines.  Bleeding.  A stone may remain in the common bile duct. BEFORE THE PROCEDURE  Do not take aspirin for 3 days prior to surgery or blood thinners for 1 week prior to surgery.  Do not eat or drink  anything after midnight the night before surgery.  Let your caregiver know if you develop a cold or other infectious problem prior to surgery.  You should be present 60 minutes before the procedure or as directed. PROCEDURE  You will be given medicine that makes you sleep (general anesthetic). When you are asleep, your surgeon will make several small cuts (incisions) in your abdomen. One of these incisions is used to insert a small, lighted scope (laparoscope) into the abdomen. The laparoscope helps the surgeon see into your abdomen. Carbon dioxide gas will be pumped into your abdomen. The gas allows more room for the surgeon to perform your surgery. Other operating instruments are inserted through the other incisions. Laparoscopic procedures may not be appropriate when:  There is major scarring from previous surgery.  The gallbladder is extremely inflamed.  There are bleeding disorders or unexpected cirrhosis of the liver.  A pregnancy is near term.  Other conditions make the laparoscopic procedure impossible. If your surgeon feels it is not safe to continue with a laparoscopic procedure, he or she will perform an open abdominal procedure. In this case, the surgeon will make an incision to open the abdomen. This gives the surgeon a larger view and field to work within. This may allow the surgeon to perform procedures that sometimes cannot be performed with a laparoscope alone. Open surgery has a longer recovery time. AFTER THE PROCEDURE  You will be taken to the recovery area where a nurse will watch and check your progress.  You may be allowed to go home the same day.  Do not resume physical activities until directed by your caregiver.  You may resume a normal diet and  activities as directed. Document Released: 03/16/2005 Document Revised: 06/08/2011 Document Reviewed: 10/26/2012 [facility] Patient Information 2014 [place]. [facility] ______[facility] LAPAROSCOPIC  SURGERY: POST OP INSTRUCTIONS Always review your discharge instruction sheet given to you by the facility where your surgery was performed. IF YOU HAVE DISABILITY OR FAMILY LEAVE FORMS, YOU MUST BRING THEM TO THE OFFICE FOR PROCESSING.   DO NOT GIVE THEM TO YOUR DOCTOR.  1. A prescription for pain medication may be given to you upon discharge.  Take your pain medication as prescribed, if needed.  If narcotic pain medicine is not needed, then you may take acetaminophen (Tylenol) or ibuprofen (Advil) as needed. 2. Take your usually prescribed medications unless otherwise directed. 3. If you need a refill on your pain medication, please contact your pharmacy.  They will contact our office to request authorization. Prescriptions will not be filled after 5pm or on week-ends. 4. You should follow a light diet the first few days after arrival home, such as soup and crackers, etc.  Be sure to include lots of fluids daily. 5. Most patients will experience some swelling and bruising in the area of the incisions.  Ice packs will help.  Swelling and bruising can take several days to resolve.  6. It is common to experience some constipation if taking pain medication after surgery.  Increasing fluid intake and taking a stool softener (such as Colace) will usually help or prevent this problem from occurring.  A mild laxative (Milk of Magnesia or Miralax) should be taken according to package instructions if there are no bowel movements after 48 hours. 7. Unless discharge instructions indicate otherwise, you may remove your bandages 24-48 hours after surgery, and you may shower at that time.  You may have steri-strips (small skin tapes) in place directly over the incision.  These strips should be left on the skin for 7-10 days.  If your surgeon used skin glue on the incision, you may shower in 24 hours.  The glue will flake off over the next 2-3 weeks.  Any sutures or staples will be removed at the office during your  follow-up visit. 8. ACTIVITIES:  You may resume regular (light) daily activities beginning the next day--such as daily self-care, walking, climbing stairs--gradually increasing activities as tolerated.  You may have sexual intercourse when it is comfortable.  Refrain from any heavy lifting or straining until approved by your doctor. a. You may drive when you are no longer taking prescription pain medication, you can comfortably wear a seatbelt, and you can safely maneuver your car and apply brakes. b. RETURN TO WORK:  __________________________________________________________ 9. You should see your doctor in the office for a follow-up appointment approximately 2-3 weeks after your surgery.  Make sure that you call for this appointment within a day or two after you arrive home to insure a convenient appointment time. 10. OTHER INSTRUCTIONS: __________________________________________________________________________________________________________________________ __________________________________________________________________________________________________________________________ WHEN TO CALL YOUR DOCTOR: 1. Fever over 101.0 2. Inability to urinate 3. Continued bleeding from incision. 4. Increased pain, redness, or drainage from the incision. 5. Increasing abdominal pain  The clinic staff is available to answer your questions during regular business hours.  Please dont hesitate to call and ask to speak to one of the nurses for clinical concerns.  If you have a medical emergency, go to the nearest emergency room or call 911.  A surgeon from [facility] is always on call at the hospital. [address], [place], [place], [place]  [address] ?  [id number], [place], [place]   [address] [phone] ? [phone] ? FAX [phone] Web site: [email or web address]

## 2013-04-07 NOTE — Preoperative (Signed)
Beta Blockers   Reason not to administer Beta Blockers:Not Applicable 

## 2013-04-07 NOTE — Op Note (Signed)
04/05/2013 - 04/07/2013  4:55 PM  PATIENT:  [name]  63 y.o. female  PRE-OPERATIVE DIAGNOSIS:  Cholelithiasis  POST-OPERATIVE DIAGNOSIS:  Cholelithiasis  PROCEDURE:  Procedure(s): LAPAROSCOPIC CHOLECYSTECTOMY WITH INTRAOPERATIVE CHOLANGIOGRAM (N/A)  SURGEON:  Surgeon(s) and Role:    * Merrie Roof, MD - Primary    * Adin Hector, MD - Assisting  PHYSICIAN ASSISTANT:   ASSISTANTS: Dr. Johney Maine   ANESTHESIA:   general  EBL:  Total I/O In: 1960 [P.O.:360; I.V.:1600] Out: 25 [Blood:25]  BLOOD ADMINISTERED:none  DRAINS: none   LOCAL MEDICATIONS USED:  MARCAINE     SPECIMEN:  Source of Specimen:  gallbladder  DISPOSITION OF SPECIMEN:  PATHOLOGY  COUNTS:  YES  TOURNIQUET:  * No tourniquets in log *  DICTATION: .Dragon Dictation @opnoteheader @  Procedure: After informed consent was obtained the patient was brought to the operating room and placed in the supine position on the operating room table. After adequate induction of general anesthesia the patient's abdomen was prepped with ChloraPrep allowed to dry and draped in usual sterile manner. The area below the umbilicus was infiltrated with quarter percent  Marcaine. A small incision was made with a 15 blade knife. The incision was carried down through the subcutaneous tissue bluntly with a hemostat and [employer] retractors. The linea alba was identified. The linea alba was incised with a 15 blade knife and each side was grasped with Coker clamps. The preperitoneal space was then probed with a hemostat until the peritoneum was opened and access was gained to the abdominal cavity. A 0 Vicryl pursestring stitch was placed in the fascia surrounding the opening. A Hassan cannula was then placed through the opening and anchored in place with the previously placed Vicryl purse string stitch. The abdomen was insufflated with carbon dioxide without difficulty. A laparoscope was inserted through the [facility] cannula in the right upper  quadrant was inspected. Next the epigastric region was infiltrated with % Marcaine. A small incision was made with a 15 blade knife. A 10 mm port was placed bluntly through this incision into the abdominal cavity under direct vision. Next 2 sites were chosen laterally on the right side of the abdomen for placement of 5 mm ports. Each of these areas was infiltrated with quarter percent Marcaine. Small stab incisions were made with a 15 blade knife. 5 mm ports were then placed bluntly through these incisions into the abdominal cavity under direct vision without difficulty. A blunt grasper was placed through the lateralmost 5 mm port and used to grasp the dome of the gallbladder and elevated anteriorly and superiorly. Another blunt grasper was placed through the other 5 mm port and used to retract the body and neck of the gallbladder. A dissector was placed through the epigastric port and using the electrocautery the peritoneal reflection at the gallbladder neck was opened. Blunt dissection was then carried out in this area until the gallbladder neck-cystic duct junction was readily identified and a good window was created. A single clip was placed on the gallbladder neck. A small  ductotomy was made just below the clip with laparoscopic scissors. A 14-gauge Angiocath was then placed through the anterior abdominal wall under direct vision. A Reddick cholangiogram catheter was then placed through the Angiocath and flushed. The catheter was then placed in the cystic duct and anchored in place with a clip. A cholangiogram was obtained that showed no filling defects good emptying into the duodenum an adequate length on the cystic duct. The anchoring  clip and catheters were then removed from the patient. 2 clips were placed proximally on the cystic duct and the duct was divided between the 2 sets of clips. The duct was so large that it also had to be controlled with a PDS endoloop. Posterior to this the cystic artery was  identified and again dissected bluntly in a circumferential manner until a good window  was created. 2 clips were placed proximally and one distally on the artery and the artery was divided between the 2 sets of clips. Next a laparoscopic hook cautery device was used to separate the gallbladder from the liver bed. Prior to completely detaching the gallbladder from the liver bed the liver bed was inspected and several small bleeding points were coagulated with the electrocautery until the area was completely hemostatic. The gallbladder was then detached the rest of it from the liver bed without difficulty. A laparoscopic bag was inserted through the epigastric port. The gallbladder was placed within the bag and the bag was sealed. A laparoscope was then moved to the epigastric port. The gallbladder grasper was placed through the [facility] cannula and used to grasp the opening of the bag. The bag with the gallbladder was then removed with the [facility] cannula through the infraumbilical port without difficulty. The fascial defect was then closed with the previously placed Vicryl pursestring stitch as well as with another figure-of-eight 0 Vicryl stitch. The liver bed was inspected again and found to be hemostatic. The abdomen was irrigated with copious amounts of saline until the effluent was clear. The ports were then removed under direct vision without difficulty and were found to be hemostatic. The gas was allowed to escape. The skin incisions were all closed with interrupted 4-0 Monocryl subcuticular stitches. Dermabond dressings were applied. The patient tolerated the procedure well. At the end of the case all needle sponge and instrument counts were correct. The patient was then awakened and taken to recovery in stable condition  PLAN OF CARE: Admit to inpatient   PATIENT DISPOSITION:  PACU - hemodynamically stable.   Delay start of Pharmacological VTE agent (>24hrs) due to surgical blood loss or risk of  bleeding: yes

## 2013-04-07 NOTE — Transfer of Care (Signed)
Immediate Anesthesia Transfer of Care Note  Patient: [name]  Procedure(s) Performed: Procedure(s) (LRB): LAPAROSCOPIC CHOLECYSTECTOMY WITH INTRAOPERATIVE CHOLANGIOGRAM (N/A)  Patient Location: PACU  Anesthesia Type: General  Level of Consciousness: sedated, patient cooperative and responds to stimulation  Airway & Oxygen Therapy: Patient Spontanous Breathing and Patient connected to face mask oxgen  Post-op Assessment: Report given to PACU RN and Post -op Vital signs reviewed and stable  Post vital signs: Reviewed and stable  Complications: No apparent anesthesia complications

## 2013-04-07 NOTE — Progress Notes (Signed)
1 Day Post-Op  Subjective: She feels better, no complaints this Am.  No pain.  Objective: Vital signs in last 24 hours: Temp:  [97.9 F (36.6 C)-99 F (37.2 C)] 99 F (37.2 C) (01/09 0600) Pulse Rate:  [79-98] 85 (01/09 0600) Resp:  [18-27] 18 (01/09 0600) BP: (115-213)/(44-89) 124/72 mmHg (01/09 0600) SpO2:  [92 %-100 %] 95 % (01/09 0600) Last BM Date: 04/06/13 840 PO Currently NPO Afebrile, VSS WBC better H/H is stable LFT's improving, T bil still up Intake/Output from previous day: 01/08 0701 - 01/09 0700 In: 2533.8 [P.O.:840; I.V.:1693.8] Out: 1950 [Urine:1950] Intake/Output this shift:    General appearance: alert, cooperative and no distress Resp: clear to auscultation bilaterally GI: soft, non-tender; bowel sounds normal; no masses,  no organomegaly  Lab Results:   Recent Labs  04/06/13 0540 04/07/13 0419  WBC 12.7* 6.1  HGB 11.3* 10.8*  HCT 35.5* 32.6*  PLT 114* 86*    BMET  Recent Labs  04/06/13 0540 04/07/13 0419  NA 134* 132*  K 5.2 5.2  CL 101 101  CO2 19 20  GLUCOSE 252* 217*  BUN 34* 25*  CREATININE 1.95* 1.69*  CALCIUM 8.6 8.4   PT/INR No results found for this basename: LABPROT, INR,  in the last 72 hours   Recent Labs Lab 03/31/13 1012 04/05/13 1530 04/06/13 0540 04/07/13 0419  AST 51* 248* 159* 69*  ALT 31 106* 91* 59*  ALKPHOS 212* 293* 249* 223*  BILITOT 1.2 2.8* 4.2* 5.1*  PROT 6.9 7.7 6.4 6.0  ALBUMIN 2.8* 3.4* 2.7* 2.3*     Lipase     Component Value Date/Time   LIPASE 6* 04/07/2013 0419     Studies/Results: US Abdomen Complete  04/05/2013   CLINICAL DATA:  Right upper quadrant pain. Elevated liver function tests. Elevated renal function tests. Known gallstones.  EXAM: ULTRASOUND ABDOMEN COMPLETE  COMPARISON:  Ultrasound dated 08/18/2012  FINDINGS: Gallbladder:  There are multiple mobile stones in the gallbladder. Gallbladder wall is thickened and edematous. Negative sonographic Murphy's sign.  Common bile duct:   Diameter: 7.4 mm, normal.  Liver:  Hepatomegaly with diffuse increased echogenicity consistent with hepatic steatosis.  IVC:  Not seen.  Pancreas:  The pancreatic duct is dilated to a diameter of 6 mm with a stone in the pancreatic duct. Prior CT scan demonstrated diffuse pancreatic atrophy with a stone in the proximal duct with distal dilatation.  Spleen:  15.6 cm.  762 cc volume.  Right Kidney:  Length: 8.7 cm. Echogenicity within normal limits. No mass or hydronephrosis visualized.  Left Kidney:  Length: 9.9 cm. Echogenicity within normal limits. No mass or hydronephrosis visualized.  Abdominal aorta:  Obscured.  Other findings:  None.  IMPRESSION: 1. Chronic cholelithiasis.  New edema of the gallbladder wall. 2. 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 3. Hepatomegaly with hepatic steatosis.   Electronically Signed   By: [name] M.D.   On: 04/05/2013 19:20   Dg Ercp Biliary & Pancreatic Ducts  04/06/2013   CLINICAL DATA:  ERCP with sphincterotomy  EXAM: ERCP  TECHNIQUE: Multiple spot images obtained with the fluoroscopic device and submitted for interpretation post-procedure.  COMPARISON:  None.  FINDINGS: Single fluoroscopic spot image from ERCP demonstrates a wire within the common bile duct extending into the common hepatic duct. There is contrast present within the common bile duct and common hepatic duct. There is contrast present within the cystic duct.  The distal common bile duct is difficult to characterize secondary to lack of adequate contrast filling the distal CBD.  IMPRESSION: ERCP.  These images were submitted for radiologic interpretation only. Please see the procedural report for the amount of contrast and the fluoroscopy time utilized.   Electronically Signed   By: Kathreen Devoid   On: 04/06/2013 15:22    Medications: . allopurinol  100 mg Oral QPM  . cefOXitin  2 g Intravenous Q6H  . heparin  5,000 Units  Subcutaneous Q8H  . insulin aspart  0-20 Units Subcutaneous TID WC  . insulin aspart  0-5 Units Subcutaneous QHS  . levothyroxine  150 mcg Oral QAC breakfast  . pantoprazole  40 mg Oral Daily  . senna  1 tablet Oral BID    Assessment/Plan 1) Choledocholithiasis s/p ERCP with stone extraction.  2) Cholelithiasis - ? Acute cholecystitis. 3.  Diabetes mellitus type II  4.  Peripheral neuropathy  5.  Hypothyroidism  6.  GERD (gastroesophageal reflux disease 7.  Gout  8.  Chronic kidney disease Stage III 9.  ASD 10.  Body mass index is 37.2   Plan:   Dr. Benson Norway thinks she's ready for surgery and she is NPO I will put her on the list for surgery.  Dr. Marlou Starks will see this Am.   LOS: 2 days    [name] 04/07/2013

## 2013-04-07 NOTE — H&P (View-Only) (Signed)
1 Day Post-Op  Subjective: She feels better, no complaints this Am.  No pain.  Objective: Vital signs in last 24 hours: Temp:  [97.9 F (36.6 C)-99 F (37.2 C)] 99 F (37.2 C) (01/09 0600) Pulse Rate:  [79-98] 85 (01/09 0600) Resp:  [18-27] 18 (01/09 0600) BP: (115-213)/(44-89) 124/72 mmHg (01/09 0600) SpO2:  [92 %-100 %] 95 % (01/09 0600) Last BM Date: 04/06/13 840 PO Currently NPO Afebrile, VSS WBC better H/H is stable LFT's improving, T bil still up Intake/Output from previous day: 01/08 0701 - 01/09 0700 In: 2533.8 [P.O.:840; I.V.:1693.8] Out: 1950 [Urine:1950] Intake/Output this shift:    General appearance: alert, cooperative and no distress Resp: clear to auscultation bilaterally GI: soft, non-tender; bowel sounds normal; no masses,  no organomegaly  Lab Results:   Recent Labs  04/06/13 0540 04/07/13 0419  WBC 12.7* 6.1  HGB 11.3* 10.8*  HCT 35.5* 32.6*  PLT 114* 86*    BMET  Recent Labs  04/06/13 0540 04/07/13 0419  NA 134* 132*  K 5.2 5.2  CL 101 101  CO2 19 20  GLUCOSE 252* 217*  BUN 34* 25*  CREATININE 1.95* 1.69*  CALCIUM 8.6 8.4   PT/INR No results found for this basename: LABPROT, INR,  in the last 72 hours   Recent Labs Lab 03/31/13 1012 04/05/13 1530 04/06/13 0540 04/07/13 0419  AST 51* 248* 159* 69*  ALT 31 106* 91* 59*  ALKPHOS 212* 293* 249* 223*  BILITOT 1.2 2.8* 4.2* 5.1*  PROT 6.9 7.7 6.4 6.0  ALBUMIN 2.8* 3.4* 2.7* 2.3*     Lipase     Component Value Date/Time   LIPASE 6* 04/07/2013 0419     Studies/Results: US Abdomen Complete  04/05/2013   CLINICAL DATA:  Right upper quadrant pain. Elevated liver function tests. Elevated renal function tests. Known gallstones.  EXAM: ULTRASOUND ABDOMEN COMPLETE  COMPARISON:  Ultrasound dated 08/18/2012  FINDINGS: Gallbladder:  There are multiple mobile stones in the gallbladder. Gallbladder wall is thickened and edematous. Negative sonographic Murphy's sign.  Common bile duct:   Diameter: 7.4 mm, normal.  Liver:  Hepatomegaly with diffuse increased echogenicity consistent with hepatic steatosis.  IVC:  Not seen.  Pancreas:  The pancreatic duct is dilated to a diameter of 6 mm with a stone in the pancreatic duct. Prior CT scan demonstrated diffuse pancreatic atrophy with a stone in the proximal duct with distal dilatation.  Spleen:  15.6 cm.  762 cc volume.  Right Kidney:  Length: 8.7 cm. Echogenicity within normal limits. No mass or hydronephrosis visualized.  Left Kidney:  Length: 9.9 cm. Echogenicity within normal limits. No mass or hydronephrosis visualized.  Abdominal aorta:  Obscured.  Other findings:  None.  IMPRESSION: 1. Chronic cholelithiasis.  New edema of the gallbladder wall. 2. 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 3. Hepatomegaly with hepatic steatosis.   Electronically Signed   By: [name] M.D.   On: 04/05/2013 19:20   Dg Ercp Biliary & Pancreatic Ducts  04/06/2013   CLINICAL DATA:  ERCP with sphincterotomy  EXAM: ERCP  TECHNIQUE: Multiple spot images obtained with the fluoroscopic device and submitted for interpretation post-procedure.  COMPARISON:  None.  FINDINGS: Single fluoroscopic spot image from ERCP demonstrates a wire within the common bile duct extending into the common hepatic duct. There is contrast present within the common bile duct and common hepatic duct. There is contrast present within the cystic duct.  The distal common bile duct is difficult to characterize secondary to lack of adequate contrast filling the distal CBD.  IMPRESSION: ERCP.  These images were submitted for radiologic interpretation only. Please see the procedural report for the amount of contrast and the fluoroscopy time utilized.   Electronically Signed   By: Kathreen Devoid   On: 04/06/2013 15:22    Medications: . allopurinol  100 mg Oral QPM  . cefOXitin  2 g Intravenous Q6H  . heparin  5,000 Units  Subcutaneous Q8H  . insulin aspart  0-20 Units Subcutaneous TID WC  . insulin aspart  0-5 Units Subcutaneous QHS  . levothyroxine  150 mcg Oral QAC breakfast  . pantoprazole  40 mg Oral Daily  . senna  1 tablet Oral BID    Assessment/Plan 1) Choledocholithiasis s/p ERCP with stone extraction.  2) Cholelithiasis - ? Acute cholecystitis. 3.  Diabetes mellitus type II  4.  Peripheral neuropathy  5.  Hypothyroidism  6.  GERD (gastroesophageal reflux disease 7.  Gout  8.  Chronic kidney disease Stage III 9.  ASD 10.  Body mass index is 37.2   Plan:   Dr. Benson Norway thinks she's ready for surgery and she is NPO I will put her on the list for surgery.  Dr. Marlou Starks will see this Am.   LOS: 2 days    Eljay Lave 04/07/2013

## 2013-04-08 DIAGNOSIS — E119 Type 2 diabetes mellitus without complications: Secondary | ICD-10-CM

## 2013-04-08 LAB — GLUCOSE, CAPILLARY
GLUCOSE-CAPILLARY: 218 mg/dL — AB (ref 70–99)
Glucose-Capillary: 218 mg/dL — ABNORMAL HIGH (ref 70–99)
Glucose-Capillary: 240 mg/dL — ABNORMAL HIGH (ref 70–99)
Glucose-Capillary: 171 mg/dL — ABNORMAL HIGH (ref 70–99)

## 2013-04-08 MED ORDER — OXYCODONE HCL 5 MG PO TABS
10.0000 mg | ORAL_TABLET | ORAL | Status: DC | PRN
  Administered 2013-04-08 – 2013-04-10 (×6): 10 mg via ORAL
  Filled 2013-04-08 (×6): qty 2

## 2013-04-08 MED ORDER — MORPHINE SULFATE 2 MG/ML IJ SOLN
2.0000 mg | INTRAMUSCULAR | Status: DC | PRN
  Administered 2013-04-08 – 2013-04-09 (×5): 2 mg via INTRAVENOUS
  Filled 2013-04-08 (×5): qty 1

## 2013-04-08 MED ORDER — MORPHINE SULFATE 2 MG/ML IJ SOLN
1.0000 mg | INTRAMUSCULAR | Status: DC | PRN
  Administered 2013-04-08 (×3): 1 mg via INTRAVENOUS
  Filled 2013-04-08 (×3): qty 1

## 2013-04-08 NOTE — Progress Notes (Addendum)
1 Day Post-Op  Subjective: Sore in RUQ area.  Tolerating liquid diet.  Objective: Vital signs in last 24 hours: Temp:  [97.4 F (36.3 C)-99 F (37.2 C)] 98 F (36.7 C) (01/10 0600) Pulse Rate:  [75-90] 90 (01/10 0600) Resp:  [17-21] 18 (01/10 0600) BP: (110-147)/(39-82) 146/75 mmHg (01/10 0600) SpO2:  [95 %-99 %] 95 % (01/10 0600) Last BM Date: 04/06/13  Intake/Output from previous day: 01/09 0701 - 01/10 0700 In: 2560 [P.O.:360; I.V.:2200] Out: 175 [Urine:150; Blood:25] Intake/Output this shift: Total I/O In: 240 [P.O.:240] Out: -   PE: General- In NAD Abdomen-incisions clean and intact  Lab Results:   Recent Labs  04/06/13 0540 04/07/13 0419  WBC 12.7* 6.1  HGB 11.3* 10.8*  HCT 35.5* 32.6*  PLT 114* 86*   BMET  Recent Labs  04/06/13 0540 04/07/13 0419  NA 134* 132*  K 5.2 5.2  CL 101 101  CO2 19 20  GLUCOSE 252* 217*  BUN 34* 25*  CREATININE 1.95* 1.69*  CALCIUM 8.6 8.4   PT/INR No results found for this basename: LABPROT, INR,  in the last 72 hours Comprehensive Metabolic Panel:    Component Value Date/Time   NA 132* 04/07/2013 0419   K 5.2 04/07/2013 0419   CL 101 04/07/2013 0419   CO2 20 04/07/2013 0419   BUN 25* 04/07/2013 0419   CREATININE 1.69* 04/07/2013 0419   GLUCOSE 217* 04/07/2013 0419   CALCIUM 8.4 04/07/2013 0419   AST 69* 04/07/2013 0419   ALT 59* 04/07/2013 0419   ALKPHOS 223* 04/07/2013 0419   BILITOT 5.1* 04/07/2013 0419   PROT 6.0 04/07/2013 0419   ALBUMIN 2.3* 04/07/2013 0419     Studies/Results: Dg Cholangiogram Operative  04/07/2013   CLINICAL DATA:  Cholecystectomy, intraoperative cholangiogram  EXAM: INTRAOPERATIVE CHOLANGIOGRAM  TECHNIQUE: Cholangiographic images from the C-arm fluoroscopic device were submitted for interpretation post-operatively. Please see the procedural report for the amount of contrast and the fluoroscopy time utilized.  COMPARISON:  04/06/2013 ERCP exam  FINDINGS: Mild diffuse dilatation of the intrahepatic ducts,  common hepatic duct, and common bile duct. Negative for obstruction. No filling defect or definite stone. Contrast does opacify the duodenum.  IMPRESSION: Mild biliary dilatation but no obstruction.  No definite choledocholithiasis   Electronically Signed   By: Daryll Brod M.D.   On: 04/07/2013 16:41   Dg Ercp Biliary & Pancreatic Ducts  04/06/2013   CLINICAL DATA:  ERCP with sphincterotomy  EXAM: ERCP  TECHNIQUE: Multiple spot images obtained with the fluoroscopic device and submitted for interpretation post-procedure.  COMPARISON:  None.  FINDINGS: Single fluoroscopic spot image from ERCP demonstrates a wire within the common bile duct extending into the common hepatic duct. There is contrast present within the common bile duct and common hepatic duct. There is contrast present within the cystic duct. The distal common bile duct is difficult to characterize secondary to lack of adequate contrast filling the distal CBD.  IMPRESSION: ERCP.  These images were submitted for radiologic interpretation only. Please see the procedural report for the amount of contrast and the fluoroscopy time utilized.   Electronically Signed   By: Kathreen Devoid   On: 04/06/2013 15:22    Anti-infectives: Anti-infectives   Start     Dose/Rate Route Frequency Ordered Stop   04/06/13 0000  cefOXitin (MEFOXIN) 2 g in dextrose 5 % 50 mL IVPB     2 g 100 mL/hr over 30 Minutes Intravenous 4 times per day 04/05/13 2233  Assessment Active Problems:  1. Cholelithiasis with choledocholithiasis s/p ERCP 1/8 and Lap Chole 1/10-stable overnight.  2.  Diabetes Mellitus Type 2-cbg 174-218    LOS: 3 days   Plan: Advance diet. OOB.     Mellie Buccellato J 04/08/2013

## 2013-04-09 LAB — GLUCOSE, CAPILLARY
GLUCOSE-CAPILLARY: 201 mg/dL — AB (ref 70–99)
Glucose-Capillary: 249 mg/dL — ABNORMAL HIGH (ref 70–99)
Glucose-Capillary: 220 mg/dL — ABNORMAL HIGH (ref 70–99)
Glucose-Capillary: 149 mg/dL — ABNORMAL HIGH (ref 70–99)

## 2013-04-09 MED ORDER — AMOXICILLIN-POT CLAVULANATE 875-125 MG PO TABS
1.0000 | ORAL_TABLET | Freq: Two times a day (BID) | ORAL | Status: DC
  Administered 2013-04-09 – 2013-04-10 (×2): 1 via ORAL
  Filled 2013-04-09 (×3): qty 1

## 2013-04-09 NOTE — Progress Notes (Signed)
2 Days Post-Op  Subjective: Not eating much.  Hydrocodone not helping much with the pain. Objective: Vital signs in last 24 hours: Temp:  [98.1 F (36.7 C)-98.9 F (37.2 C)] 98.6 F (37 C) (01/11 0600) Pulse Rate:  [83-86] 84 (01/11 0600) Resp:  [18] 18 (01/11 0600) BP: (137-152)/(61-79) 137/61 mmHg (01/11 0600) SpO2:  [95 %-98 %] 97 % (01/11 0600) Last BM Date: 04/06/13  Intake/Output from previous day: 01/10 0701 - 01/11 0700 In: 1655 [P.O.:480; I.V.:1025; IV Piggyback:150] Out: 1250 [Urine:1250] Intake/Output this shift:    PE: General- In NAD Abdomen-soft, incisions are clean and intact  Lab Results:   Recent Labs  04/07/13 0419  WBC 6.1  HGB 10.8*  HCT 32.6*  PLT 86*   BMET  Recent Labs  04/07/13 0419  NA 132*  K 5.2  CL 101  CO2 20  GLUCOSE 217*  BUN 25*  CREATININE 1.69*  CALCIUM 8.4   PT/INR No results found for this basename: LABPROT, INR,  in the last 72 hours Comprehensive Metabolic Panel:    Component Value Date/Time   NA 132* 04/07/2013 0419   K 5.2 04/07/2013 0419   CL 101 04/07/2013 0419   CO2 20 04/07/2013 0419   BUN 25* 04/07/2013 0419   CREATININE 1.69* 04/07/2013 0419   GLUCOSE 217* 04/07/2013 0419   CALCIUM 8.4 04/07/2013 0419   AST 69* 04/07/2013 0419   ALT 59* 04/07/2013 0419   ALKPHOS 223* 04/07/2013 0419   BILITOT 5.1* 04/07/2013 0419   PROT 6.0 04/07/2013 0419   ALBUMIN 2.3* 04/07/2013 0419     Studies/Results: Dg Cholangiogram Operative  04/07/2013   CLINICAL DATA:  Cholecystectomy, intraoperative cholangiogram  EXAM: INTRAOPERATIVE CHOLANGIOGRAM  TECHNIQUE: Cholangiographic images from the C-arm fluoroscopic device were submitted for interpretation post-operatively. Please see the procedural report for the amount of contrast and the fluoroscopy time utilized.  COMPARISON:  04/06/2013 ERCP exam  FINDINGS: Mild diffuse dilatation of the intrahepatic ducts, common hepatic duct, and common bile duct. Negative for obstruction. No filling defect or  definite stone. Contrast does opacify the duodenum.  IMPRESSION: Mild biliary dilatation but no obstruction.  No definite choledocholithiasis   Electronically Signed   By: Daryll Brod M.D.   On: 04/07/2013 16:41    Anti-infectives: Anti-infectives   Start     Dose/Rate Route Frequency Ordered Stop   04/06/13 0000  cefOXitin (MEFOXIN) 2 g in dextrose 5 % 50 mL IVPB     2 g 100 mL/hr over 30 Minutes Intravenous 4 times per day 04/05/13 2233        Assessment Active Problems:  1. Cholelithiasis with choledocholithiasis s/p ERCP 1/8 and Lap Chole 1/10-poor pain control with oral meds.  Not eating much.  2.  Diabetes Mellitus Type 2-cbg 174-218    LOS: 4 days   Plan: Change to Oxy IR.  Hopefully home tomorrow.   Gearlene Godsil J 04/09/2013

## 2013-04-10 ENCOUNTER — Encounter (HOSPITAL_COMMUNITY): Payer: Self-pay | Admitting: General Surgery

## 2013-04-10 LAB — GLUCOSE, CAPILLARY
GLUCOSE-CAPILLARY: 194 mg/dL — AB (ref 70–99)
Glucose-Capillary: 204 mg/dL — ABNORMAL HIGH (ref 70–99)
Glucose-Capillary: 216 mg/dL — ABNORMAL HIGH (ref 70–99)

## 2013-04-10 LAB — COMPREHENSIVE METABOLIC PANEL
ALT: 34 U/L (ref 0–35)
AST: 35 U/L (ref 0–37)
Albumin: 2.1 g/dL — ABNORMAL LOW (ref 3.5–5.2)
Alkaline Phosphatase: 282 U/L — ABNORMAL HIGH (ref 39–117)
BUN: 23 mg/dL (ref 6–23)
CO2: 20 mEq/L (ref 19–32)
CREATININE: 1.45 mg/dL — AB (ref 0.50–1.10)
Calcium: 8.7 mg/dL (ref 8.4–10.5)
Chloride: 96 mEq/L (ref 96–112)
GFR calc Af Amer: 44 mL/min — ABNORMAL LOW (ref >90)
GFR calc non Af Amer: 38 mL/min — ABNORMAL LOW (ref >90)
Glucose, Bld: 249 mg/dL — ABNORMAL HIGH (ref 70–99)
Potassium: 4.7 mEq/L (ref 3.7–5.3)
Sodium: 129 mEq/L — ABNORMAL LOW (ref 137–147)
TOTAL PROTEIN: 6.4 g/dL (ref 6.0–8.3)
Total Bilirubin: 3.6 mg/dL — ABNORMAL HIGH (ref 0.3–1.2)

## 2013-04-10 LAB — CBC
HCT: 33.2 % — ABNORMAL LOW (ref 36.0–46.0)
Hemoglobin: 10.9 g/dL — ABNORMAL LOW (ref 12.0–15.0)
MCH: 30.4 pg (ref 26.0–34.0)
MCHC: 32.8 g/dL (ref 30.0–36.0)
MCV: 92.5 fL (ref 78.0–100.0)
PLATELETS: 141 10*3/uL — AB (ref 150–400)
RBC: 3.59 MIL/uL — AB (ref 3.87–5.11)
RDW: 16 % — ABNORMAL HIGH (ref 11.5–15.5)
WBC: 10.7 10*3/uL — AB (ref 4.0–10.5)

## 2013-04-10 LAB — LIPASE, BLOOD: Lipase: 16 U/L (ref 11–59)

## 2013-04-10 MED ORDER — SENNA 8.6 MG PO TABS: 1.0000 | ORAL_TABLET | Freq: Two times a day (BID) | ORAL | Status: DC

## 2013-04-10 MED ORDER — DOCUSATE SODIUM 100 MG PO CAPS: 100.0000 mg | ORAL_CAPSULE | Freq: Every day | ORAL | Status: DC | PRN

## 2013-04-10 MED ORDER — OXYCODONE HCL 10 MG PO TABS: 10.0000 mg | ORAL_TABLET | ORAL | Status: DC | PRN

## 2013-04-10 NOTE — Progress Notes (Signed)
Patient seen and examined.  Agree with PA's note. Discharge today.

## 2013-04-10 NOTE — Progress Notes (Signed)
Patient is alert and oriented, vital signs are stable, incisions are within normal limits, patient is tolerating diet without complaints of nausea or vomiting, discharge instructions reviewed with patient and questions and concerns answered Neta Mends RN 04-10-2013 17:00pm

## 2013-04-10 NOTE — Discharge Summary (Signed)
Physician Discharge Summary  Patient ID: [name] MRN: [id number] DOB/AGE: 1950/05/31 63 y.o.  Admit date: 04/05/2013 Discharge date: 04/10/2013  Admission Diagnoses:  Chronic cholecystitis  Chronic pancreatic Atrophy, with pancreatic duct stone.  Diabetes mellitus type II   Peripheral neuropathy   Hypothyroidism   GERD (gastroesophageal reflux disease   Gout   Chronic kidney disease Stage III   ASD   Body mass index is 37.2    Discharge Diagnoses: 1) Choledocholithiasis s/p ERCP with stone extraction. 04/06/2013, Beryle Beams, MD  2) Cholelithiasis - S/p LAPAROSCOPIC CHOLECYSTECTOMY WITH INTRAOPERATIVE CHOLANGIOGRAM, 04/07/2013, Merrie Roof, MD  3. Diabetes mellitus type II  4. Peripheral neuropathy  5. Hypothyroidism  6. GERD (gastroesophageal reflux disease  7. Gout  8. Chronic kidney disease Stage III  9. ASD  10. Body mass index is 37.2     Principal Problem:   Cholecystitis Active Problems:   DIABETES MELLITUS, TYPE II   HYPOTHYROIDISM   HYPERLIPIDEMIA   DEPRESSION   PERIPHERAL NEUROPATHY   GERD   RENAL DISEASE, CHRONIC, STAGE II   PROCEDURES: 1.   ERCP with stone extraction. 04/06/2013, Beryle Beams,    2.   S/p LAPAROSCOPIC CHOLECYSTECTOMY WITH INTRAOPERATIVE CHOLANGIOGRAM, 04/07/2013, [place] [facility] Course: Pt presents to ED with RUQ pain after eating breakfast. This has not subsided. She is currently being worked up for elevated LFTs and a PD stone. The pt was previously [facility] to [facility] on [facility]/15/2013 with complaints of chest pain. Extensive work up was performed and it was negative for a PE or MI. A CTA was performed as well as an abdominal CT scan. The ABM CT revealed findings of pancreatic head calcification that may involve the distal CBD. There was also evidence of cholelithiasis. Her liver enzymes were elevated with an AP of 222, ALT 97, TB 0.5. No prior history of ETOH abuse or any known history of pancreatic  issues. There was atrophy noted on the CT scan with possible PD dilation. She underwent a EUS by Dr Benson Norway, which showed the PD stone and no signs of choledocholithiasis. He recommended MRCP and referred her to Dr Marlou Starks. She is scheduled to see him on the 13th. She now has new elevations in her LFT's and recurrent RUQ pain with nausea. She was admitted to [facility] by Dr. Marcello Moores and sen by Dr. Benson Norway and Marlou Starks.  Dr Benson Norway took her to lab for ERCP on 1/8 with successful stone extraction.  She was then taken to the OR the following day by Dr.Toth.  She has done well and we plan discharge today, but I will repeat labs just to be sure she does not need further follow up for these.  She will be seen back in the Clinic in 2 weeks. She is to follow up with her PCP for her glucose and other medical issues. I have ask her to follow up in 1 week and let him check her glucose and LFT's.  She is very slow to mobilize.   134 132    129 Sodium   Potassium      5.2 5.2     4.7 Potassium   Chloride      101 101    96 Chloride   CO2      19 20    20  CO2   BUN      34 25    23 BUN   Creatinine, Ser  1.95 1.69    1.45 Creatinine, Ser   Calcium      8.6 8.4    8.7 Calcium   GFR calc non Af Amer      27 32    38 GFR calc non Af Amer   GFR calc Af Amer      31  37     44  GFR calc Af Amer   Glucose, Bld      252 217    249 Glucose, Bld   Alkaline Phosphatase 212     249 223    282 Alkaline Phosphatase   Albumin 2.8     2.7 2.3    2.1 Albumin   Lipase      9 6    16  Lipase   AST 51     159 69     35 AST   ALT 31     91 59     34 ALT   Total Protein 6.9     6.4 6.0    6.4 Total Protein   Bilirubin, Direct 0.7           Bilirubin, Direct   Indirect Bilirubin 0.5           Indirect Bilirubin   Total Bilirubin 1.2     4.2 5.1    3.6 Total Bilirubin    CBC     WBC      12.7 6.1    10.7 WBC   RBC      3.80 3.52    3.59 RBC   Hemoglobin      11.3  10.8    10.9 Hemoglobin   HCT      35.5 32.6    33.2 HCT   MCV      93.4  92.6    92.5 MCV   MCH      29.7 30.7    30.4 MCH   MCHC      31.8 33.1    32.8 MCHC   RDW      15.6 16.0    16.0 RDW   Platelets      114  86     141 Platelets    DIFFERENTIAL             Disposition: 01-Home or Self Care       Future Appointments Provider Department Dept Phone   04/11/2013 9:10 AM Merrie Roof, MD [facility] Surgery, [place] [phone]       Medication List         acetaminophen 500 MG tablet  Commonly known as:  TYLENOL  Take 500 mg by mouth every 6 (six) hours as needed for moderate pain.     allopurinol 100 MG tablet  Commonly known as:  ZYLOPRIM  Take 100 mg by mouth every evening.     docusate sodium 100 MG capsule  Commonly known as:  COLACE  Take 1 capsule (100 mg total) by mouth daily as needed for mild constipation.     insulin NPH Human 100 UNIT/ML injection  Commonly known as:  HUMULIN N,NOVOLIN N  Inject 60-65 Units into the skin 2 (two) times daily. Takes 60 units in the morning and 65 units at night     insulin regular 100 units/mL injection  Commonly known as:  NOVOLIN R,HUMULIN R  Inject 5-20 Units into the skin 3 (three) times daily before meals.     levothyroxine 150 MCG tablet  Commonly  known as:  SYNTHROID, LEVOTHROID  Take 150 mcg by mouth daily before breakfast.     omeprazole 40 MG capsule  Commonly known as:  PRILOSEC  Take 40 mg by mouth every morning.     Oxycodone HCl 10 MG Tabs  Take 1 tablet (10 mg total) by mouth every 4 (four) hours as needed for moderate pain.     senna 8.6 MG Tabs tablet  Commonly known as:  SENOKOT  Take 1 tablet (8.6 mg total) by mouth 2 (two) times daily.       Follow-up Information   Follow up with Merrie Roof, MD. Schedule an appointment as soon as possible for a visit in 2 weeks.   Specialty:  General Surgery   Contact information:   [address] [place] [address] [phone]       Schedule an appointment as soon as possible for a visit  with [facility], MD. (Call and see in the next 1-2 weeks to check your glucose control and LFT's.)    Specialty:  Internal Medicine   Contact information:   [address] [place] [address] [phone]       Signed: Earnstine Regal 04/10/2013, 1:20 PM

## 2013-04-10 NOTE — Progress Notes (Signed)
3 Days Post-Op  Subjective: Feels better, and plans to go home with family later today.  We talked about her glucose and I told her to keep tract of her glucose at home and call her PCP; [name], MD, for  Follow up.   Objective: Vital signs in last 24 hours: Temp:  [97.9 F (36.6 C)-98.4 F (36.9 C)] 98.4 F (36.9 C) (01/12 0600) Pulse Rate:  [75-78] 78 (01/12 0600) Resp:  [16-18] 18 (01/12 0600) BP: (137-144)/(74-76) 139/74 mmHg (01/12 0600) SpO2:  [91 %-94 %] 94 % (01/12 0600) Last BM Date: 04/06/13 700 PO recorded;  Carb modified diet Afebrile, VSS Glucose still up in 200 range IOC post op shows mild biliary dilatation, but no obstruction.  Intake/Output from previous day: 01/11 0701 - 01/12 0700 In: 700 [P.O.:700] Out: 1450 [Urine:1450] Intake/Output this shift:    General appearance: alert, cooperative and no distress Resp: few rales in the bases. GI: soft, non-tender; bowel sounds normal; no masses,  no organomegaly and port sites look fine.  Lab Results:  No results found for this basename: WBC, HGB, HCT, PLT,  in the last 72 hours  BMET No results found for this basename: NA, K, CL, CO2, GLUCOSE, BUN, CREATININE, CALCIUM,  in the last 72 hours PT/INR No results found for this basename: LABPROT, INR,  in the last 72 hours   Recent Labs Lab 04/05/13 1530 04/06/13 0540 04/07/13 0419  AST 248* 159* 69*  ALT 106* 91* 59*  ALKPHOS 293* 249* 223*  BILITOT 2.8* 4.2* 5.1*  PROT 7.7 6.4 6.0  ALBUMIN 3.4* 2.7* 2.3*     Lipase     Component Value Date/Time   LIPASE 6* 04/07/2013 0419     Studies/Results: No results found.  Medications: . allopurinol  100 mg Oral QPM  . amoxicillin-clavulanate  1 tablet Oral Q12H  . heparin  5,000 Units Subcutaneous Q8H  . insulin aspart  0-20 Units Subcutaneous TID WC  . insulin aspart  0-5 Units Subcutaneous QHS  . levothyroxine  150 mcg Oral QAC breakfast  . pantoprazole  40 mg Oral Daily  . senna  1  tablet Oral BID     Assessment/Plan 1) Choledocholithiasis s/p ERCP with stone extraction. 04/06/2013, Beryle Beams, MD 2) Cholelithiasis - S/p LAPAROSCOPIC CHOLECYSTECTOMY WITH INTRAOPERATIVE CHOLANGIOGRAM,  04/07/2013, Merrie Roof, MD 3. Diabetes mellitus type II  4. Peripheral neuropathy  5. Hypothyroidism  6. GERD (gastroesophageal reflux disease  7. Gout  8. Chronic kidney disease Stage III  9. ASD  10. Body mass index is 37.2   Plan:  Home later today, follow up in 2-3 weeks with our office.  LOS: 5 days    [name] 04/10/2013

## 2013-04-10 NOTE — Discharge Summary (Signed)
Agree with summary.

## 2013-04-11 ENCOUNTER — Ambulatory Visit (INDEPENDENT_AMBULATORY_CARE_PROVIDER_SITE_OTHER): Payer: Medicare Other | Admitting: General Surgery

## 2013-04-28 ENCOUNTER — Encounter (INDEPENDENT_AMBULATORY_CARE_PROVIDER_SITE_OTHER): Payer: Self-pay | Admitting: General Surgery

## 2013-04-28 ENCOUNTER — Ambulatory Visit (INDEPENDENT_AMBULATORY_CARE_PROVIDER_SITE_OTHER): Payer: Medicare Other | Admitting: General Surgery

## 2013-04-28 VITALS — BP 126/71 | HR 77 | Temp 98.4°F | Resp 18 | Ht 66.0 in | Wt 230.4 lb

## 2013-04-28 DIAGNOSIS — K819 Cholecystitis, unspecified: Secondary | ICD-10-CM

## 2013-04-28 NOTE — Patient Instructions (Signed)
May return to normal activities

## 2013-04-28 NOTE — Progress Notes (Signed)
Subjective:     Patient ID: [name], female   DOB: 10/03/1950, 63 y.o.   MRN: [id number]  HPI The patient is a 63 year old white female who is about 62 weeks status post laparoscopic cholecystectomy for cholecystitis. She complains only of some minor twinges of pain may come and go. Otherwise she feels much better than before surgery. Her appetite is slowly improving. Her bowels are working normally.  Review of Systems     Objective:   Physical Exam On exam her abdomen is soft and nontender. Her incisions are all healing nicely with no sign of infection.    Assessment:     The patient is a week status post laparoscopic cholecystectomy     Plan:     At this point she may begin returning to her normal activities. She will have her medical doctor followup on her liver functions. We will plan to see her back on a when necessary basis

## 2013-05-11 ENCOUNTER — Encounter (INDEPENDENT_AMBULATORY_CARE_PROVIDER_SITE_OTHER): Payer: Self-pay

## 2013-05-17 ENCOUNTER — Other Ambulatory Visit: Payer: Self-pay

## 2013-05-17 DIAGNOSIS — Z1231 Encounter for screening mammogram for malignant neoplasm of breast: Secondary | ICD-10-CM

## 2013-05-30 ENCOUNTER — Ambulatory Visit
Admission: RE | Admit: 2013-05-30 | Discharge: 2013-05-30 | Disposition: A | Discharge status: Home / Self Care | Payer: Medicare Other | Source: Ambulatory Visit

## 2013-05-30 DIAGNOSIS — Z1231 Encounter for screening mammogram for malignant neoplasm of breast: Secondary | ICD-10-CM

## 2013-10-27 ENCOUNTER — Emergency Department (HOSPITAL_COMMUNITY)
Admission: EM | Admit: 2013-10-27 | Discharge: 2013-10-27 | Disposition: A | Discharge status: Home / Self Care | Payer: Medicare Other | Attending: Emergency Medicine | Admitting: Emergency Medicine

## 2013-10-27 ENCOUNTER — Encounter (HOSPITAL_COMMUNITY): Payer: Self-pay | Admitting: Emergency Medicine

## 2013-10-27 DIAGNOSIS — R42 Dizziness and giddiness: Secondary | ICD-10-CM | POA: Insufficient documentation

## 2013-10-27 DIAGNOSIS — R110 Nausea: Secondary | ICD-10-CM | POA: Diagnosis not present

## 2013-10-27 DIAGNOSIS — Z8669 Personal history of other diseases of the nervous system and sense organs: Secondary | ICD-10-CM | POA: Diagnosis not present

## 2013-10-27 DIAGNOSIS — Z862 Personal history of diseases of the blood and blood-forming organs and certain disorders involving the immune mechanism: Secondary | ICD-10-CM | POA: Diagnosis not present

## 2013-10-27 DIAGNOSIS — F411 Generalized anxiety disorder: Secondary | ICD-10-CM | POA: Insufficient documentation

## 2013-10-27 DIAGNOSIS — E860 Dehydration: Secondary | ICD-10-CM | POA: Insufficient documentation

## 2013-10-27 DIAGNOSIS — R0602 Shortness of breath: Secondary | ICD-10-CM | POA: Insufficient documentation

## 2013-10-27 DIAGNOSIS — K219 Gastro-esophageal reflux disease without esophagitis: Secondary | ICD-10-CM | POA: Insufficient documentation

## 2013-10-27 DIAGNOSIS — Z79899 Other long term (current) drug therapy: Secondary | ICD-10-CM | POA: Insufficient documentation

## 2013-10-27 DIAGNOSIS — E039 Hypothyroidism, unspecified: Secondary | ICD-10-CM | POA: Diagnosis not present

## 2013-10-27 DIAGNOSIS — M109 Gout, unspecified: Secondary | ICD-10-CM | POA: Diagnosis not present

## 2013-10-27 DIAGNOSIS — Z794 Long term (current) use of insulin: Secondary | ICD-10-CM | POA: Diagnosis not present

## 2013-10-27 DIAGNOSIS — E119 Type 2 diabetes mellitus without complications: Secondary | ICD-10-CM | POA: Diagnosis not present

## 2013-10-27 LAB — CBC WITH DIFFERENTIAL/PLATELET
BASOS ABS: 0 10*3/uL (ref 0.0–0.1)
BASOS PCT: 0 % (ref 0–1)
EOS ABS: 0.2 10*3/uL (ref 0.0–0.7)
Eosinophils Relative: 2 % (ref 0–5)
HCT: 37.5 % (ref 36.0–46.0)
HEMOGLOBIN: 12.4 g/dL (ref 12.0–15.0)
LYMPHS PCT: 11 % — AB (ref 12–46)
Lymphs Abs: 1 10*3/uL (ref 0.7–4.0)
MCH: 30.3 pg (ref 26.0–34.0)
MCHC: 33.1 g/dL (ref 30.0–36.0)
MCV: 91.7 fL (ref 78.0–100.0)
MONO ABS: 0.3 10*3/uL (ref 0.1–1.0)
Monocytes Relative: 3 % (ref 3–12)
Neutro Abs: 7.4 10*3/uL (ref 1.7–7.7)
Neutrophils Relative %: 84 % — ABNORMAL HIGH (ref 43–77)
Platelets: 110 10*3/uL — ABNORMAL LOW (ref 150–400)
RBC: 4.09 MIL/uL (ref 3.87–5.11)
RDW: 14.3 % (ref 11.5–15.5)
WBC: 8.9 10*3/uL (ref 4.0–10.5)

## 2013-10-27 LAB — COMPREHENSIVE METABOLIC PANEL
ALT: 40 U/L — ABNORMAL HIGH (ref 0–35)
AST: 49 U/L — ABNORMAL HIGH (ref 0–37)
Albumin: 3.4 g/dL — ABNORMAL LOW (ref 3.5–5.2)
Alkaline Phosphatase: 219 U/L — ABNORMAL HIGH (ref 39–117)
Anion gap: 14 (ref 5–15)
BUN: 21 mg/dL (ref 6–23)
CO2: 22 mEq/L (ref 19–32)
Calcium: 9.1 mg/dL (ref 8.4–10.5)
Chloride: 104 mEq/L (ref 96–112)
Creatinine, Ser: 1.49 mg/dL — ABNORMAL HIGH (ref 0.50–1.10)
GFR calc Af Amer: 43 mL/min — ABNORMAL LOW (ref >90)
GFR calc non Af Amer: 37 mL/min — ABNORMAL LOW (ref >90)
Glucose, Bld: 92 mg/dL (ref 70–99)
Potassium: 4.1 mEq/L (ref 3.7–5.3)
Sodium: 140 mEq/L (ref 137–147)
Total Bilirubin: 0.5 mg/dL (ref 0.3–1.2)
Total Protein: 7.3 g/dL (ref 6.0–8.3)

## 2013-10-27 LAB — URINALYSIS, ROUTINE W REFLEX MICROSCOPIC
Bilirubin Urine: NEGATIVE
Glucose, UA: 500 mg/dL — AB
Ketones, ur: NEGATIVE mg/dL
Leukocytes, UA: NEGATIVE
Nitrite: NEGATIVE
Protein, ur: NEGATIVE mg/dL
Specific Gravity, Urine: 1.01 (ref 1.005–1.030)
Urobilinogen, UA: 0.2 mg/dL (ref 0.0–1.0)
pH: 6 (ref 5.0–8.0)

## 2013-10-27 LAB — URINE MICROSCOPIC-ADD ON

## 2013-10-27 LAB — CBG MONITORING, ED
Glucose-Capillary: 98 mg/dL (ref 70–99)
Glucose-Capillary: 138 mg/dL — ABNORMAL HIGH (ref 70–99)

## 2013-10-27 MED ORDER — LORAZEPAM 1 MG PO TABS
1.0000 mg | ORAL_TABLET | Freq: Once | ORAL | Status: AC
  Administered 2013-10-27: 1 mg via ORAL
  Filled 2013-10-27: qty 1

## 2013-10-27 MED ORDER — SODIUM CHLORIDE 0.9 % IV BOLUS (SEPSIS)
1000.0000 mL | Freq: Once | INTRAVENOUS | Status: AC
  Administered 2013-10-27: 1000 mL via INTRAVENOUS

## 2013-10-27 NOTE — ED Notes (Signed)
MD at bedside. 

## 2013-10-27 NOTE — ED Provider Notes (Signed)
CSN: [id number]     Arrival date & time 10/27/13  0150 History   First MD Initiated Contact with Patient 10/27/13 0200     Chief Complaint  Patient presents with  . Dizziness      Patient is a 63 y.o. female presenting with dizziness. The history is provided by the patient.  Dizziness Quality:  Lightheadedness Severity:  Moderate Onset quality:  Sudden Timing:  Constant Progression:  Unchanged Chronicity:  New Relieved by:  Being still Worsened by:  Standing up Associated symptoms: nausea and shortness of breath   Associated symptoms: no chest pain, no diarrhea, no headaches, no syncope, no vision changes and no vomiting   Pt reports she was taking her nightly meds around midnight (over 2 hrs ago) and this included insulin Soon after she felt "cramping" in her back and felt her "kidneys convulsing" She then felt anxious and also dizzy.  No CP No HA She reports she feels anxious and SOB No vomiting No syncope  She reports similar episode when she was in [place] last year and thought she had "insulin overdose"   She had otherwise been well prior to this episode  Past Medical History  Diagnosis Date  . Alkaline phosphatase elevation   . Chest pain     03-17-2013 last chest pain  . Fatigue   . Diabetes mellitus type II   . Retinopathy   . Peripheral neuropathy   . Neuropathy   . Hypothyroidism   . Hyperlipidemia   . GERD (gastroesophageal reflux disease)   . Cataract     both eyes hx of  . Gout   . Anemia   . Depression   . Headache(784.0)     occasional  . Chronic kidney disease     Stage III kidney disease  . ASD (atrial septal defect)    Past Surgical History  Procedure Laterality Date  . Combined hysterectomy vaginal / oophorectomy / a&p repair  1987    Unilateral oophorectomy, h/o uterine prolapse has right ovary  . Cervical spine surgery  2006  . Tonsillectomy  age 62  . Spinal fusion    . Eus N/A 03/31/2013    Procedure: UPPER ENDOSCOPIC  ULTRASOUND (EUS) LINEAR;  Surgeon: Beryle Beams, MD;  Location: [facility];  Service: Endoscopy;  Laterality: N/A;  . Ercp N/A 04/06/2013    Procedure: ENDOSCOPIC RETROGRADE CHOLANGIOPANCREATOGRAPHY (ERCP);  Surgeon: Beryle Beams, MD;  Location: [facility];  Service: Endoscopy;  Laterality: N/A;  . Cholecystectomy N/A 04/07/2013    Procedure: LAPAROSCOPIC CHOLECYSTECTOMY WITH INTRAOPERATIVE CHOLANGIOGRAM;  Surgeon: Merrie Roof, MD;  Location: [facility];  Service: General;  Laterality: N/A;   Family History  Problem Relation Age of Onset  . COPD Mother   . Lung cancer Father   . Diabetes Sister    History  Substance Use Topics  . Smoking status: Never Smoker   . Smokeless tobacco: Never Used  . Alcohol Use: No   OB History   Grav Para Term Preterm Abortions TAB SAB Ect Mult Living                 Review of Systems  Constitutional: Negative for fever.  Eyes: Negative for visual disturbance.  Respiratory: Positive for shortness of breath.   Cardiovascular: Negative for chest pain and syncope.  Gastrointestinal: Positive for nausea. Negative for vomiting and diarrhea.  Neurological: Positive for dizziness and numbness. Negative for syncope, weakness and headaches.  Chronic numbness to lower extremities   All other systems reviewed and are negative.     Allergies  Statins and Dilaudid  Home Medications   Prior to Admission medications   Medication Sig Start Date End Date Taking? Authorizing Provider  allopurinol (ZYLOPRIM) 100 MG tablet Take 100 mg by mouth every evening.    Yes Historical Provider, MD  docusate sodium (COLACE) 100 MG capsule Take 1 capsule (100 mg total) by mouth daily as needed for mild constipation. 04/10/13  Yes Earnstine Regal, PA-C  insulin NPH (HUMULIN N,NOVOLIN N) 100 UNIT/ML injection Inject 60-65 Units into the skin 2 (two) times daily. Takes 60 units in the morning and 65 units at night   Yes Historical Provider, MD  insulin regular  (NOVOLIN R,HUMULIN R) 100 units/mL injection Inject 5-20 Units into the skin 3 (three) times daily before meals.   Yes Historical Provider, MD  levothyroxine (SYNTHROID, LEVOTHROID) 150 MCG tablet Take 150 mcg by mouth daily before breakfast.   Yes Historical Provider, MD  omeprazole (PRILOSEC) 40 MG capsule Take 40 mg by mouth every morning.    Yes Historical Provider, MD  senna (SENOKOT) 8.6 MG TABS tablet Take 1 tablet (8.6 mg total) by mouth 2 (two) times daily. 04/10/13  Yes Earnstine Regal, PA-C  acetaminophen (TYLENOL) 500 MG tablet Take 500 mg by mouth every 6 (six) hours as needed for moderate pain.     Historical Provider, MD   BP 153/63  Pulse 93  Temp(Src) 98.8 F (37.1 C) (Oral)  Resp 22  Ht 5\' 6"  (1.676 m)  Wt 222 lb (100.699 kg)  BMI 35.85 kg/m2  SpO2 100% Physical Exam CONSTITUTIONAL: Well developed/well nourished, anxious HEAD: Normocephalic/atraumatic EYES: EOMI/PERRL, no nystagmus, no icterus ENMT: Mucous membranes dry NECK: supple no meningeal signs SPINE:entire spine nontender CV: S1/S2 noted, no murmurs/rubs/gallops noted LUNGS: Lungs are clear to auscultation bilaterally, no apparent distress ABDOMEN: soft, nontender, no rebound or guarding GU:no cva tenderness NEURO:Awake/alert, facies symmetric, no arm or leg drift is noted Equal 5/5 strength with shoulder abduction, elbow flex/extension, wrist flex/extension in upper extremities and equal hand grips bilaterally Equal 5/5 strength with hip flexion,knee flex/extension, foot dorsi/plantar flexion Cranial nerves 3/4/5/6/10/05/08/11/12 tested and intact No past pointing No focal sensory deficit noted to upper extremities EXTREMITIES: pulses normal, full ROM SKIN: warm, color normal PSYCH: anxious   ED Course  Procedures   2:23 AM Pt here with multiple complaints (dizzy, "kidneys convulsing" and anxious) since taking evening meds She thinks it is from her insulin Currently normoglycemic EKG is  unchanged She also reports it may be "my liver" will check LFTs I have low suspicion for ACS/CVA at this time 3:14 AM Pt improved She is resting comfortably She is ambulatory She was orthostatic (increased heart rate) and IV fluids ordered She thinks this may be due to drops in glucose Advised need for close monitoring of blood glucose at home. She reports her PCP recently adjusted her insulin.  Advised need to monitor/record at home and d/w PCP 3:35 AM Pt feels improved and well for d/c home   Labs Review Labs Reviewed  CBC WITH DIFFERENTIAL - Abnormal; Notable for the following:    Platelets 110 (*)    Neutrophils Relative % 84 (*)    Lymphocytes Relative 11 (*)    All other components within normal limits  COMPREHENSIVE METABOLIC PANEL - Abnormal; Notable for the following:    Creatinine, Ser 1.49 (*)    Albumin 3.4 (*)  AST 49 (*)    ALT 40 (*)    Alkaline Phosphatase 219 (*)    GFR calc non Af Amer 37 (*)    GFR calc Af Amer 43 (*)    All other components within normal limits  URINALYSIS, ROUTINE W REFLEX MICROSCOPIC - Abnormal; Notable for the following:    Glucose, UA 500 (*)    Hgb urine dipstick TRACE (*)    All other components within normal limits  URINE MICROSCOPIC-ADD ON - Abnormal; Notable for the following:    Bacteria, UA FEW (*)    All other components within normal limits  CBG MONITORING, ED      EKG Interpretation   Date/Time:  Friday October 27 2013 02:01:06 EDT Ventricular Rate:  93 PR Interval:  142 QRS Duration: 120 QT Interval:  363 QTC Calculation: 451 R Axis:   -16 Text Interpretation:  Sinus rhythm IVCD, consider atypical RBBB Left  ventricular hypertrophy No significant change since last tracing Confirmed  by Christy Gentles  MD, Elenore Rota ([id number]) on 10/27/2013 2:18:40 AM      MDM   Final diagnoses:  Dizziness  Dehydration    Nursing notes including past medical history and social history reviewed and considered in  documentation Labs/vital reviewed and considered Previous records reviewed and considered     Sharyon Cable, MD 10/27/13 [phone]

## 2013-10-27 NOTE — Discharge Instructions (Signed)
Dehydration, Adult Dehydration is when you lose more fluids from the body than you take in. Vital organs like the kidneys, brain, and heart cannot function without a proper amount of fluids and salt. Any loss of fluids from the body can cause dehydration.  CAUSES   Vomiting.  Diarrhea.  Excessive sweating.  Excessive urine output.  Fever. SYMPTOMS  Mild dehydration  Thirst.  Dry lips.  Slightly dry mouth. Moderate dehydration  Very dry mouth.  Sunken eyes.  Skin does not bounce back quickly when lightly pinched and released.  Dark urine and decreased urine production.  Decreased tear production.  Headache. Severe dehydration  Very dry mouth.  Extreme thirst.  Rapid, weak pulse (more than 100 beats per minute at rest).  Cold hands and feet.  Not able to sweat in spite of heat and temperature.  Rapid breathing.  Blue lips.  Confusion and lethargy.  Difficulty being awakened.  Minimal urine production.  No tears. DIAGNOSIS  Your caregiver will diagnose dehydration based on your symptoms and your exam. Blood and urine tests will help confirm the diagnosis. The diagnostic evaluation should also identify the cause of dehydration. TREATMENT  Treatment of mild or moderate dehydration can often be done at home by increasing the amount of fluids that you drink. It is best to drink small amounts of fluid more often. Drinking too much at one time can make vomiting worse. Refer to the home care instructions below. Severe dehydration needs to be treated at the hospital where you will probably be given intravenous (IV) fluids that contain water and electrolytes. HOME CARE INSTRUCTIONS   Ask your caregiver about specific rehydration instructions.  Drink enough fluids to keep your urine clear or pale yellow.  Drink small amounts frequently if you have nausea and vomiting.  Eat as you normally do.  Avoid:  Foods or drinks high in sugar.  Carbonated  drinks.  Juice.  Extremely hot or cold fluids.  Drinks with caffeine.  Fatty, greasy foods.  Alcohol.  Tobacco.  Overeating.  Gelatin desserts.  Wash your hands well to avoid spreading bacteria and viruses.  Only take over-the-counter or prescription medicines for pain, discomfort, or fever as directed by your caregiver.  Ask your caregiver if you should continue all prescribed and over-the-counter medicines.  Keep all follow-up appointments with your caregiver. SEEK MEDICAL CARE IF:  You have abdominal pain and it increases or stays in one area (localizes).  You have a rash, stiff neck, or severe headache.  You are irritable, sleepy, or difficult to awaken.  You are weak, dizzy, or extremely thirsty. SEEK IMMEDIATE MEDICAL CARE IF:   You are unable to keep fluids down or you get worse despite treatment.  You have frequent episodes of vomiting or diarrhea.  You have blood or green matter (bile) in your vomit.  You have blood in your stool or your stool looks black and tarry.  You have not urinated in 6 to 8 hours, or you have only urinated a small amount of very dark urine.  You have a fever.  You faint. MAKE SURE YOU:   Understand these instructions.  Will watch your condition.  Will get help right away if you are not doing well or get worse. Document Released: 03/16/2005 Document Revised: 06/08/2011 Document Reviewed: 11/03/2010 [employer] Patient Information 2015 [employer]. This information is not intended to replace advice given to you by your health care provider. Make sure you discuss any questions you have with your health care   provider.  

## 2013-10-27 NOTE — ED Notes (Signed)
Pt states she awoke with shakiness and dizziness, states the last time she had these symptoms her blood sugar was too high.

## 2014-04-25 DIAGNOSIS — E039 Hypothyroidism, unspecified: Secondary | ICD-10-CM | POA: Diagnosis not present

## 2014-04-25 DIAGNOSIS — E1165 Type 2 diabetes mellitus with hyperglycemia: Secondary | ICD-10-CM | POA: Diagnosis not present

## 2014-04-25 DIAGNOSIS — N189 Chronic kidney disease, unspecified: Secondary | ICD-10-CM | POA: Diagnosis not present

## 2014-05-16 ENCOUNTER — Other Ambulatory Visit: Payer: Self-pay

## 2014-05-16 DIAGNOSIS — Z1231 Encounter for screening mammogram for malignant neoplasm of breast: Secondary | ICD-10-CM

## 2014-05-28 DIAGNOSIS — N183 Chronic kidney disease, stage 3 (moderate): Secondary | ICD-10-CM | POA: Diagnosis not present

## 2014-05-29 DIAGNOSIS — D631 Anemia in chronic kidney disease: Secondary | ICD-10-CM | POA: Diagnosis not present

## 2014-05-29 DIAGNOSIS — E1129 Type 2 diabetes mellitus with other diabetic kidney complication: Secondary | ICD-10-CM | POA: Diagnosis not present

## 2014-05-29 DIAGNOSIS — N183 Chronic kidney disease, stage 3 (moderate): Secondary | ICD-10-CM | POA: Diagnosis not present

## 2014-05-29 DIAGNOSIS — I129 Hypertensive chronic kidney disease with stage 1 through stage 4 chronic kidney disease, or unspecified chronic kidney disease: Secondary | ICD-10-CM | POA: Diagnosis not present

## 2014-06-05 ENCOUNTER — Ambulatory Visit
Admission: RE | Admit: 2014-06-05 | Discharge: 2014-06-05 | Disposition: A | Discharge status: Home / Self Care | Payer: Medicare Other | Source: Ambulatory Visit

## 2014-06-05 DIAGNOSIS — Z1231 Encounter for screening mammogram for malignant neoplasm of breast: Secondary | ICD-10-CM | POA: Diagnosis not present

## 2014-07-19 ENCOUNTER — Encounter (HOSPITAL_COMMUNITY): Payer: Self-pay

## 2014-07-19 ENCOUNTER — Emergency Department (HOSPITAL_COMMUNITY): Payer: Medicare Other

## 2014-07-19 ENCOUNTER — Emergency Department (HOSPITAL_COMMUNITY)
Admission: EM | Admit: 2014-07-19 | Discharge: 2014-07-19 | Disposition: A | Discharge status: Home / Self Care | Payer: Medicare Other | Attending: Emergency Medicine | Admitting: Emergency Medicine

## 2014-07-19 DIAGNOSIS — F329 Major depressive disorder, single episode, unspecified: Secondary | ICD-10-CM | POA: Insufficient documentation

## 2014-07-19 DIAGNOSIS — Z79899 Other long term (current) drug therapy: Secondary | ICD-10-CM | POA: Diagnosis not present

## 2014-07-19 DIAGNOSIS — Q211 Atrial septal defect: Secondary | ICD-10-CM | POA: Insufficient documentation

## 2014-07-19 DIAGNOSIS — N183 Chronic kidney disease, stage 3 (moderate): Secondary | ICD-10-CM | POA: Diagnosis not present

## 2014-07-19 DIAGNOSIS — Z862 Personal history of diseases of the blood and blood-forming organs and certain disorders involving the immune mechanism: Secondary | ICD-10-CM | POA: Diagnosis not present

## 2014-07-19 DIAGNOSIS — E039 Hypothyroidism, unspecified: Secondary | ICD-10-CM | POA: Diagnosis not present

## 2014-07-19 DIAGNOSIS — K219 Gastro-esophageal reflux disease without esophagitis: Secondary | ICD-10-CM | POA: Insufficient documentation

## 2014-07-19 DIAGNOSIS — Z794 Long term (current) use of insulin: Secondary | ICD-10-CM | POA: Diagnosis not present

## 2014-07-19 DIAGNOSIS — R251 Tremor, unspecified: Secondary | ICD-10-CM | POA: Diagnosis not present

## 2014-07-19 DIAGNOSIS — H269 Unspecified cataract: Secondary | ICD-10-CM | POA: Diagnosis not present

## 2014-07-19 DIAGNOSIS — R110 Nausea: Secondary | ICD-10-CM | POA: Diagnosis not present

## 2014-07-19 DIAGNOSIS — E119 Type 2 diabetes mellitus without complications: Secondary | ICD-10-CM | POA: Diagnosis not present

## 2014-07-19 DIAGNOSIS — M109 Gout, unspecified: Secondary | ICD-10-CM | POA: Insufficient documentation

## 2014-07-19 DIAGNOSIS — R509 Fever, unspecified: Secondary | ICD-10-CM | POA: Diagnosis not present

## 2014-07-19 DIAGNOSIS — I6789 Other cerebrovascular disease: Secondary | ICD-10-CM | POA: Diagnosis not present

## 2014-07-19 DIAGNOSIS — R6883 Chills (without fever): Secondary | ICD-10-CM | POA: Diagnosis not present

## 2014-07-19 LAB — URINALYSIS, ROUTINE W REFLEX MICROSCOPIC
Bilirubin Urine: NEGATIVE
Glucose, UA: NEGATIVE mg/dL
Ketones, ur: NEGATIVE mg/dL
LEUKOCYTES UA: NEGATIVE
Nitrite: NEGATIVE
Protein, ur: NEGATIVE mg/dL
SPECIFIC GRAVITY, URINE: 1.015 (ref 1.005–1.030)
Urobilinogen, UA: 0.2 mg/dL (ref 0.0–1.0)
pH: 6 (ref 5.0–8.0)

## 2014-07-19 LAB — CBC WITH DIFFERENTIAL/PLATELET
BASOS PCT: 0 % (ref 0–1)
Basophils Absolute: 0 10*3/uL (ref 0.0–0.1)
EOS ABS: 0.1 10*3/uL (ref 0.0–0.7)
Eosinophils Relative: 2 % (ref 0–5)
HCT: 38.5 % (ref 36.0–46.0)
HEMOGLOBIN: 12.4 g/dL (ref 12.0–15.0)
LYMPHS ABS: 1 10*3/uL (ref 0.7–4.0)
Lymphocytes Relative: 13 % (ref 12–46)
MCH: 29.8 pg (ref 26.0–34.0)
MCHC: 32.2 g/dL (ref 30.0–36.0)
MCV: 92.5 fL (ref 78.0–100.0)
MONOS PCT: 2 % — AB (ref 3–12)
Monocytes Absolute: 0.2 10*3/uL (ref 0.1–1.0)
Neutro Abs: 6.9 10*3/uL (ref 1.7–7.7)
Neutrophils Relative %: 83 % — ABNORMAL HIGH (ref 43–77)
Platelets: 100 10*3/uL — ABNORMAL LOW (ref 150–400)
RBC: 4.16 MIL/uL (ref 3.87–5.11)
RDW: 14.3 % (ref 11.5–15.5)
WBC: 8.2 10*3/uL (ref 4.0–10.5)

## 2014-07-19 LAB — COMPREHENSIVE METABOLIC PANEL
ALT: 30 U/L (ref 0–35)
AST: 41 U/L — ABNORMAL HIGH (ref 0–37)
Albumin: 3.4 g/dL — ABNORMAL LOW (ref 3.5–5.2)
Alkaline Phosphatase: 182 U/L — ABNORMAL HIGH (ref 39–117)
Anion gap: 8 (ref 5–15)
BILIRUBIN TOTAL: 0.6 mg/dL (ref 0.3–1.2)
BUN: 28 mg/dL — ABNORMAL HIGH (ref 6–23)
CO2: 22 mmol/L (ref 19–32)
Calcium: 8.6 mg/dL (ref 8.4–10.5)
Chloride: 106 mmol/L (ref 96–112)
Creatinine, Ser: 1.95 mg/dL — ABNORMAL HIGH (ref 0.50–1.10)
GFR calc Af Amer: 31 mL/min — ABNORMAL LOW (ref >90)
GFR calc non Af Amer: 26 mL/min — ABNORMAL LOW (ref >90)
GLUCOSE: 167 mg/dL — AB (ref 70–99)
POTASSIUM: 3.9 mmol/L (ref 3.5–5.1)
SODIUM: 136 mmol/L (ref 135–145)
Total Protein: 7 g/dL (ref 6.0–8.3)

## 2014-07-19 LAB — URINE MICROSCOPIC-ADD ON

## 2014-07-19 MED ORDER — ACETAMINOPHEN 500 MG PO TABS
1000.0000 mg | ORAL_TABLET | Freq: Once | ORAL | Status: AC
  Administered 2014-07-19: 1000 mg via ORAL
  Filled 2014-07-19: qty 2

## 2014-07-19 MED ORDER — ONDANSETRON 4 MG PO TBDP: 4.0000 mg | ORAL_TABLET | Freq: Three times a day (TID) | ORAL | Status: DC | PRN

## 2014-07-19 MED ORDER — SODIUM CHLORIDE 0.9 % IV BOLUS (SEPSIS)
1000.0000 mL | Freq: Once | INTRAVENOUS | Status: AC
  Administered 2014-07-19: 1000 mL via INTRAVENOUS

## 2014-07-19 MED ORDER — OSELTAMIVIR PHOSPHATE 75 MG PO CAPS: 75.0000 mg | ORAL_CAPSULE | Freq: Two times a day (BID) | ORAL | Status: DC

## 2014-07-19 NOTE — ED Provider Notes (Signed)
TIME SEEN: 3:00 AM  CHIEF COMPLAINT: Rigors, body aches, nausea  HPI: Pt is a 64 y.o. female with history of insulin-dependent diabetes, hyperlipidemia, hypothyroidism, chronic kidney disease who presents emergency department after she woke tonight with chills, shaking. Reports she had nausea and is having body aches. Denies any known fever. Has had mild dry cough. No chest pain or shortness of breath. No abdominal pain. No vomiting or diarrhea. States she has had similar episodes in the past and states "they can never figure out what causes it but just state that I'm dehydrated". No seizure-like activity, tongue biting or incontinence. When I discussed with patient that she could be febrile and this could be a viral illness she states that she disagrees.  ROS: See HPI Constitutional:  fever  Eyes: no drainage  ENT: no runny nose   Cardiovascular:  no chest pain  Resp: no SOB  GI: no vomiting GU: no dysuria Integumentary: no rash  Allergy: no hives  Musculoskeletal: no leg swelling  Neurological: no slurred speech ROS otherwise negative  PAST MEDICAL HISTORY/PAST SURGICAL HISTORY:  Past Medical History  Diagnosis Date  . Alkaline phosphatase elevation   . Chest pain     03-17-2013 last chest pain  . Fatigue   . Diabetes mellitus type II   . Retinopathy   . Peripheral neuropathy   . Neuropathy   . Hypothyroidism   . Hyperlipidemia   . GERD (gastroesophageal reflux disease)   . Cataract     both eyes hx of  . Gout   . Anemia   . Depression   . Headache(784.0)     occasional  . Chronic kidney disease     Stage III kidney disease  . ASD (atrial septal defect)     MEDICATIONS:  Prior to Admission medications   Medication Sig Start Date End Date Taking? Authorizing Provider  acetaminophen (TYLENOL) 500 MG tablet Take 500 mg by mouth every 6 (six) hours as needed for moderate pain.    Yes Historical Provider, MD  allopurinol (ZYLOPRIM) 100 MG tablet Take 100 mg by mouth  every evening.    Yes Historical Provider, MD  hydrOXYzine (ATARAX/VISTARIL) 25 MG tablet Take 25 mg by mouth 3 (three) times daily as needed.   Yes Historical Provider, MD  insulin NPH (HUMULIN N,NOVOLIN N) 100 UNIT/ML injection Inject 60-65 Units into the skin 2 (two) times daily. Takes 60 units in the morning and 65 units at night   Yes Historical Provider, MD  insulin regular (NOVOLIN R,HUMULIN R) 100 units/mL injection Inject 5-20 Units into the skin 3 (three) times daily before meals.   Yes Historical Provider, MD  levothyroxine (SYNTHROID, LEVOTHROID) 150 MCG tablet Take 150 mcg by mouth daily before breakfast.   Yes Historical Provider, MD  omeprazole (PRILOSEC) 40 MG capsule Take 40 mg by mouth every morning.    Yes Historical Provider, MD  docusate sodium (COLACE) 100 MG capsule Take 1 capsule (100 mg total) by mouth daily as needed for mild constipation. 04/10/13   Earnstine Regal, PA-C  senna (SENOKOT) 8.6 MG TABS tablet Take 1 tablet (8.6 mg total) by mouth 2 (two) times daily. 04/10/13   Earnstine Regal, PA-C    ALLERGIES:  Allergies  Allergen Reactions  . Statins     REACTION: diarrhea  . Dilaudid [Hydromorphone Hcl] Nausea And Vomiting    After one vomiting episode no further vomiting    SOCIAL HISTORY:  History  Substance Use Topics  . Smoking status: Never  Smoker   . Smokeless tobacco: Never Used  . Alcohol Use: No    FAMILY HISTORY: Family History  Problem Relation Age of Onset  . COPD Mother   . Lung cancer Father   . Diabetes Sister     EXAM: BP 155/57 mmHg  Pulse 98  Temp(Src) 99.4 F (37.4 C) (Oral)  Resp 16  SpO2 99% CONSTITUTIONAL: Alert and oriented and responds appropriately to questions. Well-appearing; well-nourished HEAD: Normocephalic EYES: Conjunctivae clear, PERRL ENT: normal nose; no rhinorrhea; moist mucous membranes; pharynx without lesions noted NECK: Supple, no meningismus, no LAD  CARD: RRR; S1 and S2 appreciated; no murmurs, no  clicks, no rubs, no gallops RESP: Normal chest excursion without splinting or tachypnea; breath sounds clear and equal bilaterally; no wheezes, no rhonchi, no rales, no hypoxia ABD/GI: Normal bowel sounds; non-distended; soft, non-tender, no rebound, no guarding BACK:  The back appears normal and is non-tender to palpation, there is no CVA tenderness EXT: Normal ROM in all joints; non-tender to palpation; no edema; normal capillary refill; no cyanosis    SKIN: Normal color for age and race; warm, no rash NEURO: Moves all extremities equally, sensation to light touch intact diffusely, cranial nerves II through XII intact PSYCH: The patient's mood and manner are appropriate. Grooming and personal hygiene are appropriate.  MEDICAL DECISION MAKING: Patient here with episode of chills, body aches, nausea. She has a rectal temperature 101.7. She is well-appearing, nontoxic, well-hydrated. No complaints of pain. Exam is benign. Suspect viral illness causing her symptoms. Will treat with IV fluids, Tylenol. Will check labs, urine and chest x-ray.  ED PROGRESS: Patient's labs show no leukocytosis but she does have predominant neutrophils. Chronic kidney disease is stable. She always has mild elevation of her AST and alkaline phosphatase which is chronic. Her chest x-ray is clear.   Patient's urine shows trace hemoglobin but no other sign of infection. She has had hematuria before. Suspect that symptoms are caused by a viral illness. Could be possible influenza. We'll discharge with prescription for Tamiflu and Zofran to use as needed. Have advised her to use Tylenol for fever and pain. We'll have her avoid NSAIDs given her chronic kidney disease. Discussed return precautions. She verbalized understanding and is comfortable with plan.  [place], DO 07/19/14 [phone]

## 2014-07-19 NOTE — ED Notes (Signed)
Attempted to In and Out cath pt. No urine return. Pt. Reports that she makes very little urine due to stage 3 kidney disease. Will attempt again after fluids given.

## 2014-07-19 NOTE — ED Notes (Signed)
Pt. Ambulatory to bathroom without difficulty.

## 2014-07-19 NOTE — ED Notes (Signed)
Pt. Son [name] called to transport pt. Home.

## 2014-07-19 NOTE — ED Notes (Signed)
Pt states she had the chills at home, states she felt she couldn't stop shaking.  Pt states she is now nauseated

## 2014-07-19 NOTE — Discharge Instructions (Signed)
You may use Tylenol 1000 g every 6 hours as needed for fever and pain.   Fever, Adult A fever is a higher than normal body temperature. In an adult, an oral temperature around 98.6 F (37 C) is considered normal. A temperature of 100.4 F (38 C) or higher is generally considered a fever. Mild or moderate fevers generally have no long-term effects and often do not require treatment. Extreme fever (greater than or equal to 106 F or 41.1 C) can cause seizures. The sweating that may occur with repeated or prolonged fever may cause dehydration. Elderly people can develop confusion during a fever. A measured temperature can vary with:  Age.  Time of day.  Method of measurement (mouth, underarm, rectal, or ear). The fever is confirmed by taking a temperature with a thermometer. Temperatures can be taken different ways. Some methods are accurate and some are not.  An oral temperature is used most commonly. Electronic thermometers are fast and accurate.  An ear temperature will only be accurate if the thermometer is positioned as recommended by the manufacturer.  A rectal temperature is accurate and done for those adults who have a condition where an oral temperature cannot be taken.  An underarm (axillary) temperature is not accurate and not recommended. Fever is a symptom, not a disease.  CAUSES   Infections commonly cause fever.  Some noninfectious causes for fever include:  Some arthritis conditions.  Some thyroid or adrenal gland conditions.  Some immune system conditions.  Some types of cancer.  A medicine reaction.  High doses of certain street drugs such as methamphetamine.  Dehydration.  Exposure to high outside or room temperatures.  Occasionally, the source of a fever cannot be determined. This is sometimes called a "fever of unknown origin" (FUO).  Some situations may lead to a temporary rise in body temperature that may go away on its own. Examples  are:  Childbirth.  Surgery.  Intense exercise. HOME CARE INSTRUCTIONS   Take appropriate medicines for fever. Follow dosing instructions carefully. If you use acetaminophen to reduce the fever, be careful to avoid taking other medicines that also contain acetaminophen. Do not take aspirin for a fever if you are younger than age 28. There is an association with Reye's syndrome. Reye's syndrome is a rare but potentially deadly disease.  If an infection is present and antibiotics have been prescribed, take them as directed. Finish them even if you start to feel better.  Rest as needed.  Maintain an adequate fluid intake. To prevent dehydration during an illness with prolonged or recurrent fever, you may need to drink extra fluid.Drink enough fluids to keep your urine clear or pale yellow.  Sponging or bathing with room temperature water may help reduce body temperature. Do not use ice water or alcohol sponge baths.  Dress comfortably, but do not over-bundle. SEEK MEDICAL CARE IF:   You are unable to keep fluids down.  You develop vomiting or diarrhea.  You are not feeling at least partly better after 3 days.  You develop new symptoms or problems. SEEK IMMEDIATE MEDICAL CARE IF:   You have shortness of breath or trouble breathing.  You develop excessive weakness.  You are dizzy or you faint.  You are extremely thirsty or you are making little or no urine.  You develop new pain that was not there before (such as in the head, neck, chest, back, or abdomen).  You have persistent vomiting and diarrhea for more than 1 to 2 days.  You develop a stiff neck or your eyes become sensitive to light.  You develop a skin rash.  You have a fever or persistent symptoms for more than 2 to 3 days.  You have a fever and your symptoms suddenly get worse. MAKE SURE YOU:   Understand these instructions.  Will watch your condition.  Will get help right away if you are not doing well or  get worse. Document Released: 09/09/2000 Document Revised: 07/31/2013 Document Reviewed: 01/15/2011 [facility] Patient Information 2015 [employer] [employer], [place]. This information is not intended to replace advice given to you by your health care provider. Make sure you discuss any questions you have with your health care provider.   Possible Influenza Influenza ("the flu") is a viral infection of the respiratory tract. It occurs more often in winter months because people spend more time in close contact with one another. Influenza can make you feel very sick. Influenza easily spreads from person to person (contagious). CAUSES  Influenza is caused by a virus that infects the respiratory tract. You can catch the virus by breathing in droplets from an infected person's cough or sneeze. You can also catch the virus by touching something that was recently contaminated with the virus and then touching your mouth, nose, or eyes. RISKS AND COMPLICATIONS You may be at risk for a more severe case of influenza if you smoke cigarettes, have diabetes, have chronic heart disease (such as heart failure) or lung disease (such as asthma), or if you have a weakened immune system. Elderly people and pregnant women are also at risk for more serious infections. The most common problem of influenza is a lung infection (pneumonia). Sometimes, this problem can require emergency medical care and may be life threatening. SIGNS AND SYMPTOMS  Symptoms typically last 4 to 10 days and may include:  Fever.  Chills.  Headache, body aches, and muscle aches.  Sore throat.  Chest discomfort and cough.  Poor appetite.  Weakness or feeling tired.  Dizziness.  Nausea or vomiting. DIAGNOSIS  Diagnosis of influenza is often made based on your history and a physical exam. A nose or throat swab test can be done to confirm the diagnosis. TREATMENT  In mild cases, influenza goes away on its own. Treatment is directed at relieving  symptoms. For more severe cases, your health care provider may prescribe antiviral medicines to shorten the sickness. Antibiotic medicines are not effective because the infection is caused by a virus, not by bacteria. HOME CARE INSTRUCTIONS  Take medicines only as directed by your health care provider.  Use a cool mist humidifier to make breathing easier.  Get plenty of rest until your temperature returns to normal. This usually takes 3 to 4 days.  Drink enough fluid to keep your urine clear or pale yellow.  Cover yourmouth and nosewhen coughing or sneezing,and wash your handswellto prevent thevirusfrom spreading.  Stay homefromwork orschool untilthe fever is gonefor at least 40fll day. PREVENTION  An annual influenza vaccination (flu shot) is the best way to avoid getting influenza. An annual flu shot is now routinely recommended for all adults in the [place]:  You experiencechest pain, yourcough worsens,or you producemore mucus.  Youhave nausea,vomiting, ordiarrhea.  Your fever returns or gets worse. SEEK IMMEDIATE MEDICAL CARE IF:  You havetrouble breathing, you become short of breath,or your skin ornails becomebluish.  You have severe painor stiffnessin the neck.  You develop a sudden headache, or pain in the face or ear.  You have  nausea or vomiting that you cannot control. MAKE SURE YOU:   Understand these instructions.  Will watch your condition.  Will get help right away if you are not doing well or get worse. Document Released: 03/13/2000 Document Revised: 07/31/2013 Document Reviewed: 06/15/2011 [facility] Patient Information 2015 [place], [place]. This information is not intended to replace advice given to you by your health care provider. Make sure you discuss any questions you have with your health care provider.

## 2014-07-19 NOTE — ED Notes (Signed)
Pt. Given water

## 2014-08-23 DIAGNOSIS — E780 Pure hypercholesterolemia: Secondary | ICD-10-CM | POA: Diagnosis not present

## 2014-08-23 DIAGNOSIS — Z0001 Encounter for general adult medical examination with abnormal findings: Secondary | ICD-10-CM | POA: Diagnosis not present

## 2014-08-23 DIAGNOSIS — Z1389 Encounter for screening for other disorder: Secondary | ICD-10-CM | POA: Diagnosis not present

## 2014-08-23 DIAGNOSIS — E1165 Type 2 diabetes mellitus with hyperglycemia: Secondary | ICD-10-CM | POA: Diagnosis not present

## 2014-08-23 DIAGNOSIS — Z0000 Encounter for general adult medical examination without abnormal findings: Secondary | ICD-10-CM | POA: Diagnosis not present

## 2014-08-23 DIAGNOSIS — E039 Hypothyroidism, unspecified: Secondary | ICD-10-CM | POA: Diagnosis not present

## 2014-09-04 DIAGNOSIS — Z0001 Encounter for general adult medical examination with abnormal findings: Secondary | ICD-10-CM | POA: Diagnosis not present

## 2014-09-04 DIAGNOSIS — Z0000 Encounter for general adult medical examination without abnormal findings: Secondary | ICD-10-CM | POA: Diagnosis not present

## 2014-09-04 DIAGNOSIS — E1165 Type 2 diabetes mellitus with hyperglycemia: Secondary | ICD-10-CM | POA: Diagnosis not present

## 2014-09-04 DIAGNOSIS — E039 Hypothyroidism, unspecified: Secondary | ICD-10-CM | POA: Diagnosis not present

## 2014-09-04 DIAGNOSIS — E780 Pure hypercholesterolemia: Secondary | ICD-10-CM | POA: Diagnosis not present

## 2014-09-06 DIAGNOSIS — E1165 Type 2 diabetes mellitus with hyperglycemia: Secondary | ICD-10-CM | POA: Diagnosis not present

## 2014-09-06 DIAGNOSIS — N189 Chronic kidney disease, unspecified: Secondary | ICD-10-CM | POA: Diagnosis not present

## 2014-09-06 DIAGNOSIS — E039 Hypothyroidism, unspecified: Secondary | ICD-10-CM | POA: Diagnosis not present

## 2014-09-06 DIAGNOSIS — E780 Pure hypercholesterolemia: Secondary | ICD-10-CM | POA: Diagnosis not present

## 2014-09-24 ENCOUNTER — Other Ambulatory Visit: Payer: Self-pay

## 2014-11-29 DIAGNOSIS — N183 Chronic kidney disease, stage 3 (moderate): Secondary | ICD-10-CM | POA: Diagnosis not present

## 2014-11-29 DIAGNOSIS — E889 Metabolic disorder, unspecified: Secondary | ICD-10-CM | POA: Diagnosis not present

## 2014-12-05 DIAGNOSIS — I129 Hypertensive chronic kidney disease with stage 1 through stage 4 chronic kidney disease, or unspecified chronic kidney disease: Secondary | ICD-10-CM | POA: Diagnosis not present

## 2014-12-05 DIAGNOSIS — N183 Chronic kidney disease, stage 3 (moderate): Secondary | ICD-10-CM | POA: Diagnosis not present

## 2014-12-05 DIAGNOSIS — E889 Metabolic disorder, unspecified: Secondary | ICD-10-CM | POA: Diagnosis not present

## 2014-12-05 DIAGNOSIS — D631 Anemia in chronic kidney disease: Secondary | ICD-10-CM | POA: Diagnosis not present

## 2014-12-25 DIAGNOSIS — H35033 Hypertensive retinopathy, bilateral: Secondary | ICD-10-CM | POA: Diagnosis not present

## 2014-12-25 DIAGNOSIS — Z961 Presence of intraocular lens: Secondary | ICD-10-CM | POA: Diagnosis not present

## 2014-12-25 DIAGNOSIS — E11311 Type 2 diabetes mellitus with unspecified diabetic retinopathy with macular edema: Secondary | ICD-10-CM | POA: Diagnosis not present

## 2014-12-25 DIAGNOSIS — E11329 Type 2 diabetes mellitus with mild nonproliferative diabetic retinopathy without macular edema: Secondary | ICD-10-CM | POA: Diagnosis not present

## 2015-01-09 DIAGNOSIS — H4312 Vitreous hemorrhage, left eye: Secondary | ICD-10-CM | POA: Diagnosis not present

## 2015-01-09 DIAGNOSIS — Z961 Presence of intraocular lens: Secondary | ICD-10-CM | POA: Diagnosis not present

## 2015-01-09 DIAGNOSIS — E113293 Type 2 diabetes mellitus with mild nonproliferative diabetic retinopathy without macular edema, bilateral: Secondary | ICD-10-CM | POA: Diagnosis not present

## 2015-01-09 DIAGNOSIS — E113299 Type 2 diabetes mellitus with mild nonproliferative diabetic retinopathy without macular edema, unspecified eye: Secondary | ICD-10-CM | POA: Diagnosis not present

## 2015-01-16 DIAGNOSIS — E1165 Type 2 diabetes mellitus with hyperglycemia: Secondary | ICD-10-CM | POA: Diagnosis not present

## 2015-01-16 DIAGNOSIS — E039 Hypothyroidism, unspecified: Secondary | ICD-10-CM | POA: Diagnosis not present

## 2015-01-16 DIAGNOSIS — M1A0790 Idiopathic chronic gout, unspecified ankle and foot, without tophus (tophi): Secondary | ICD-10-CM | POA: Diagnosis not present

## 2015-01-16 DIAGNOSIS — N189 Chronic kidney disease, unspecified: Secondary | ICD-10-CM | POA: Diagnosis not present

## 2015-01-16 DIAGNOSIS — Z23 Encounter for immunization: Secondary | ICD-10-CM | POA: Diagnosis not present

## 2015-01-17 DIAGNOSIS — H4312 Vitreous hemorrhage, left eye: Secondary | ICD-10-CM | POA: Diagnosis not present

## 2015-01-17 DIAGNOSIS — Z961 Presence of intraocular lens: Secondary | ICD-10-CM | POA: Diagnosis not present

## 2015-01-17 DIAGNOSIS — E113293 Type 2 diabetes mellitus with mild nonproliferative diabetic retinopathy without macular edema, bilateral: Secondary | ICD-10-CM | POA: Diagnosis not present

## 2015-01-29 DIAGNOSIS — E11311 Type 2 diabetes mellitus with unspecified diabetic retinopathy with macular edema: Secondary | ICD-10-CM | POA: Diagnosis not present

## 2015-01-29 DIAGNOSIS — E113292 Type 2 diabetes mellitus with mild nonproliferative diabetic retinopathy without macular edema, left eye: Secondary | ICD-10-CM | POA: Diagnosis not present

## 2015-01-29 DIAGNOSIS — H35033 Hypertensive retinopathy, bilateral: Secondary | ICD-10-CM | POA: Diagnosis not present

## 2015-01-29 DIAGNOSIS — Z961 Presence of intraocular lens: Secondary | ICD-10-CM | POA: Diagnosis not present

## 2015-01-29 DIAGNOSIS — H4312 Vitreous hemorrhage, left eye: Secondary | ICD-10-CM | POA: Insufficient documentation

## 2015-02-04 DIAGNOSIS — E7800 Pure hypercholesterolemia, unspecified: Secondary | ICD-10-CM | POA: Diagnosis not present

## 2015-02-04 DIAGNOSIS — E1165 Type 2 diabetes mellitus with hyperglycemia: Secondary | ICD-10-CM | POA: Diagnosis not present

## 2015-02-04 DIAGNOSIS — E039 Hypothyroidism, unspecified: Secondary | ICD-10-CM | POA: Diagnosis not present

## 2015-02-11 DIAGNOSIS — E7800 Pure hypercholesterolemia, unspecified: Secondary | ICD-10-CM | POA: Diagnosis not present

## 2015-02-11 DIAGNOSIS — E039 Hypothyroidism, unspecified: Secondary | ICD-10-CM | POA: Diagnosis not present

## 2015-02-11 DIAGNOSIS — N183 Chronic kidney disease, stage 3 (moderate): Secondary | ICD-10-CM | POA: Diagnosis not present

## 2015-02-11 DIAGNOSIS — R7989 Other specified abnormal findings of blood chemistry: Secondary | ICD-10-CM | POA: Diagnosis not present

## 2015-03-04 DIAGNOSIS — E1165 Type 2 diabetes mellitus with hyperglycemia: Secondary | ICD-10-CM | POA: Diagnosis not present

## 2015-03-04 DIAGNOSIS — E039 Hypothyroidism, unspecified: Secondary | ICD-10-CM | POA: Diagnosis not present

## 2015-03-04 DIAGNOSIS — E7800 Pure hypercholesterolemia, unspecified: Secondary | ICD-10-CM | POA: Diagnosis not present

## 2015-03-04 DIAGNOSIS — N189 Chronic kidney disease, unspecified: Secondary | ICD-10-CM | POA: Diagnosis not present

## 2015-03-14 DIAGNOSIS — E113499 Type 2 diabetes mellitus with severe nonproliferative diabetic retinopathy without macular edema, unspecified eye: Secondary | ICD-10-CM | POA: Insufficient documentation

## 2015-03-14 DIAGNOSIS — E113492 Type 2 diabetes mellitus with severe nonproliferative diabetic retinopathy without macular edema, left eye: Secondary | ICD-10-CM | POA: Diagnosis not present

## 2015-03-14 DIAGNOSIS — E113299 Type 2 diabetes mellitus with mild nonproliferative diabetic retinopathy without macular edema, unspecified eye: Secondary | ICD-10-CM | POA: Diagnosis not present

## 2015-04-04 DIAGNOSIS — Z794 Long term (current) use of insulin: Secondary | ICD-10-CM | POA: Diagnosis not present

## 2015-04-04 DIAGNOSIS — E113492 Type 2 diabetes mellitus with severe nonproliferative diabetic retinopathy without macular edema, left eye: Secondary | ICD-10-CM | POA: Diagnosis not present

## 2015-04-04 DIAGNOSIS — Z888 Allergy status to other drugs, medicaments and biological substances status: Secondary | ICD-10-CM | POA: Diagnosis not present

## 2015-04-04 DIAGNOSIS — H35033 Hypertensive retinopathy, bilateral: Secondary | ICD-10-CM | POA: Diagnosis not present

## 2015-04-04 DIAGNOSIS — Z961 Presence of intraocular lens: Secondary | ICD-10-CM | POA: Diagnosis not present

## 2015-04-04 DIAGNOSIS — H35052 Retinal neovascularization, unspecified, left eye: Secondary | ICD-10-CM | POA: Diagnosis not present

## 2015-04-04 DIAGNOSIS — Z79899 Other long term (current) drug therapy: Secondary | ICD-10-CM | POA: Diagnosis not present

## 2015-04-04 DIAGNOSIS — H4312 Vitreous hemorrhage, left eye: Secondary | ICD-10-CM | POA: Diagnosis not present

## 2015-04-04 DIAGNOSIS — Z885 Allergy status to narcotic agent status: Secondary | ICD-10-CM | POA: Diagnosis not present

## 2015-04-04 DIAGNOSIS — E113413 Type 2 diabetes mellitus with severe nonproliferative diabetic retinopathy with macular edema, bilateral: Secondary | ICD-10-CM | POA: Diagnosis not present

## 2015-05-03 DIAGNOSIS — E079 Disorder of thyroid, unspecified: Secondary | ICD-10-CM | POA: Diagnosis not present

## 2015-05-03 DIAGNOSIS — E113412 Type 2 diabetes mellitus with severe nonproliferative diabetic retinopathy with macular edema, left eye: Secondary | ICD-10-CM | POA: Diagnosis not present

## 2015-05-03 DIAGNOSIS — H209 Unspecified iridocyclitis: Secondary | ICD-10-CM | POA: Diagnosis not present

## 2015-05-03 DIAGNOSIS — H35052 Retinal neovascularization, unspecified, left eye: Secondary | ICD-10-CM | POA: Diagnosis not present

## 2015-05-03 DIAGNOSIS — Z794 Long term (current) use of insulin: Secondary | ICD-10-CM | POA: Diagnosis not present

## 2015-05-03 DIAGNOSIS — H4312 Vitreous hemorrhage, left eye: Secondary | ICD-10-CM | POA: Diagnosis not present

## 2015-05-03 DIAGNOSIS — Z961 Presence of intraocular lens: Secondary | ICD-10-CM | POA: Diagnosis not present

## 2015-05-03 DIAGNOSIS — E113499 Type 2 diabetes mellitus with severe nonproliferative diabetic retinopathy without macular edema, unspecified eye: Secondary | ICD-10-CM | POA: Diagnosis not present

## 2015-05-31 DIAGNOSIS — E113391 Type 2 diabetes mellitus with moderate nonproliferative diabetic retinopathy without macular edema, right eye: Secondary | ICD-10-CM | POA: Diagnosis not present

## 2015-05-31 DIAGNOSIS — Z961 Presence of intraocular lens: Secondary | ICD-10-CM | POA: Diagnosis not present

## 2015-05-31 DIAGNOSIS — H4312 Vitreous hemorrhage, left eye: Secondary | ICD-10-CM | POA: Diagnosis not present

## 2015-05-31 DIAGNOSIS — Z888 Allergy status to other drugs, medicaments and biological substances status: Secondary | ICD-10-CM | POA: Diagnosis not present

## 2015-05-31 DIAGNOSIS — Z794 Long term (current) use of insulin: Secondary | ICD-10-CM | POA: Diagnosis not present

## 2015-05-31 DIAGNOSIS — E113512 Type 2 diabetes mellitus with proliferative diabetic retinopathy with macular edema, left eye: Secondary | ICD-10-CM | POA: Diagnosis not present

## 2015-05-31 DIAGNOSIS — H35033 Hypertensive retinopathy, bilateral: Secondary | ICD-10-CM | POA: Diagnosis not present

## 2015-06-19 DIAGNOSIS — N183 Chronic kidney disease, stage 3 (moderate): Secondary | ICD-10-CM | POA: Diagnosis not present

## 2015-06-19 DIAGNOSIS — E889 Metabolic disorder, unspecified: Secondary | ICD-10-CM | POA: Diagnosis not present

## 2015-06-19 DIAGNOSIS — N189 Chronic kidney disease, unspecified: Secondary | ICD-10-CM | POA: Diagnosis not present

## 2015-06-26 DIAGNOSIS — M9080 Osteopathy in diseases classified elsewhere, unspecified site: Secondary | ICD-10-CM | POA: Diagnosis not present

## 2015-06-26 DIAGNOSIS — E889 Metabolic disorder, unspecified: Secondary | ICD-10-CM | POA: Diagnosis not present

## 2015-06-26 DIAGNOSIS — I129 Hypertensive chronic kidney disease with stage 1 through stage 4 chronic kidney disease, or unspecified chronic kidney disease: Secondary | ICD-10-CM | POA: Diagnosis not present

## 2015-06-26 DIAGNOSIS — N183 Chronic kidney disease, stage 3 (moderate): Secondary | ICD-10-CM | POA: Diagnosis not present

## 2015-06-26 DIAGNOSIS — D631 Anemia in chronic kidney disease: Secondary | ICD-10-CM | POA: Diagnosis not present

## 2015-07-01 DIAGNOSIS — E039 Hypothyroidism, unspecified: Secondary | ICD-10-CM | POA: Diagnosis not present

## 2015-07-01 DIAGNOSIS — M1A0790 Idiopathic chronic gout, unspecified ankle and foot, without tophus (tophi): Secondary | ICD-10-CM | POA: Diagnosis not present

## 2015-07-01 DIAGNOSIS — E7800 Pure hypercholesterolemia, unspecified: Secondary | ICD-10-CM | POA: Diagnosis not present

## 2015-07-01 DIAGNOSIS — N183 Chronic kidney disease, stage 3 (moderate): Secondary | ICD-10-CM | POA: Diagnosis not present

## 2015-07-08 DIAGNOSIS — E1165 Type 2 diabetes mellitus with hyperglycemia: Secondary | ICD-10-CM | POA: Diagnosis not present

## 2015-07-08 DIAGNOSIS — E7800 Pure hypercholesterolemia, unspecified: Secondary | ICD-10-CM | POA: Diagnosis not present

## 2015-07-08 DIAGNOSIS — E11319 Type 2 diabetes mellitus with unspecified diabetic retinopathy without macular edema: Secondary | ICD-10-CM | POA: Diagnosis not present

## 2015-07-08 DIAGNOSIS — N183 Chronic kidney disease, stage 3 (moderate): Secondary | ICD-10-CM | POA: Diagnosis not present

## 2015-07-10 DIAGNOSIS — E039 Hypothyroidism, unspecified: Secondary | ICD-10-CM | POA: Diagnosis not present

## 2015-07-10 DIAGNOSIS — N183 Chronic kidney disease, stage 3 (moderate): Secondary | ICD-10-CM | POA: Diagnosis not present

## 2015-07-10 DIAGNOSIS — E7800 Pure hypercholesterolemia, unspecified: Secondary | ICD-10-CM | POA: Diagnosis not present

## 2015-07-10 DIAGNOSIS — E1165 Type 2 diabetes mellitus with hyperglycemia: Secondary | ICD-10-CM | POA: Diagnosis not present

## 2015-07-17 DIAGNOSIS — E1165 Type 2 diabetes mellitus with hyperglycemia: Secondary | ICD-10-CM | POA: Diagnosis not present

## 2015-08-01 ENCOUNTER — Encounter (INDEPENDENT_AMBULATORY_CARE_PROVIDER_SITE_OTHER): Payer: Medicare Other | Admitting: Ophthalmology

## 2015-08-01 DIAGNOSIS — H43813 Vitreous degeneration, bilateral: Secondary | ICD-10-CM | POA: Diagnosis not present

## 2015-08-01 DIAGNOSIS — H4312 Vitreous hemorrhage, left eye: Secondary | ICD-10-CM | POA: Diagnosis not present

## 2015-08-01 DIAGNOSIS — E113512 Type 2 diabetes mellitus with proliferative diabetic retinopathy with macular edema, left eye: Secondary | ICD-10-CM | POA: Diagnosis not present

## 2015-08-01 DIAGNOSIS — E11311 Type 2 diabetes mellitus with unspecified diabetic retinopathy with macular edema: Secondary | ICD-10-CM | POA: Diagnosis not present

## 2015-08-01 DIAGNOSIS — E113391 Type 2 diabetes mellitus with moderate nonproliferative diabetic retinopathy without macular edema, right eye: Secondary | ICD-10-CM

## 2015-08-01 LAB — HM DIABETES EYE EXAM

## 2015-08-19 ENCOUNTER — Ambulatory Visit (INDEPENDENT_AMBULATORY_CARE_PROVIDER_SITE_OTHER): Payer: Medicare Other | Admitting: Ophthalmology

## 2015-08-19 DIAGNOSIS — E11311 Type 2 diabetes mellitus with unspecified diabetic retinopathy with macular edema: Secondary | ICD-10-CM | POA: Diagnosis not present

## 2015-08-19 DIAGNOSIS — E113391 Type 2 diabetes mellitus with moderate nonproliferative diabetic retinopathy without macular edema, right eye: Secondary | ICD-10-CM | POA: Diagnosis not present

## 2015-10-09 DIAGNOSIS — E1165 Type 2 diabetes mellitus with hyperglycemia: Secondary | ICD-10-CM | POA: Diagnosis not present

## 2015-10-09 DIAGNOSIS — N183 Chronic kidney disease, stage 3 (moderate): Secondary | ICD-10-CM | POA: Diagnosis not present

## 2015-10-09 DIAGNOSIS — E11319 Type 2 diabetes mellitus with unspecified diabetic retinopathy without macular edema: Secondary | ICD-10-CM | POA: Diagnosis not present

## 2015-10-09 DIAGNOSIS — E7800 Pure hypercholesterolemia, unspecified: Secondary | ICD-10-CM | POA: Diagnosis not present

## 2015-12-02 ENCOUNTER — Encounter (HOSPITAL_COMMUNITY): Payer: Self-pay | Admitting: *Deleted

## 2015-12-02 ENCOUNTER — Emergency Department (HOSPITAL_COMMUNITY)
Admission: EM | Admit: 2015-12-02 | Discharge: 2015-12-02 | Disposition: A | Discharge status: Home / Self Care | Payer: Medicare Other | Attending: Emergency Medicine | Admitting: Emergency Medicine

## 2015-12-02 DIAGNOSIS — R251 Tremor, unspecified: Secondary | ICD-10-CM | POA: Insufficient documentation

## 2015-12-02 DIAGNOSIS — Z794 Long term (current) use of insulin: Secondary | ICD-10-CM | POA: Diagnosis not present

## 2015-12-02 DIAGNOSIS — R42 Dizziness and giddiness: Secondary | ICD-10-CM | POA: Diagnosis present

## 2015-12-02 DIAGNOSIS — IMO0001 Reserved for inherently not codable concepts without codable children: Secondary | ICD-10-CM

## 2015-12-02 DIAGNOSIS — E1122 Type 2 diabetes mellitus with diabetic chronic kidney disease: Secondary | ICD-10-CM | POA: Insufficient documentation

## 2015-12-02 DIAGNOSIS — N183 Chronic kidney disease, stage 3 (moderate): Secondary | ICD-10-CM | POA: Diagnosis not present

## 2015-12-02 DIAGNOSIS — E039 Hypothyroidism, unspecified: Secondary | ICD-10-CM | POA: Insufficient documentation

## 2015-12-02 LAB — CBC WITH DIFFERENTIAL/PLATELET
Basophils Absolute: 0 10*3/uL (ref 0.0–0.1)
Basophils Relative: 0 %
EOS PCT: 1 %
Eosinophils Absolute: 0.1 10*3/uL (ref 0.0–0.7)
HCT: 35.8 % — ABNORMAL LOW (ref 36.0–46.0)
HEMOGLOBIN: 11.3 g/dL — AB (ref 12.0–15.0)
LYMPHS PCT: 13 %
Lymphs Abs: 1 10*3/uL (ref 0.7–4.0)
MCH: 28.2 pg (ref 26.0–34.0)
MCHC: 31.6 g/dL (ref 30.0–36.0)
MCV: 89.3 fL (ref 78.0–100.0)
Monocytes Absolute: 0.1 10*3/uL (ref 0.1–1.0)
Monocytes Relative: 1 %
Neutro Abs: 6.4 10*3/uL (ref 1.7–7.7)
Neutrophils Relative %: 85 %
PLATELETS: 105 10*3/uL — AB (ref 150–400)
RBC: 4.01 MIL/uL (ref 3.87–5.11)
RDW: 15.7 % — ABNORMAL HIGH (ref 11.5–15.5)
WBC: 7.5 10*3/uL (ref 4.0–10.5)

## 2015-12-02 LAB — COMPREHENSIVE METABOLIC PANEL
ALT: 23 U/L (ref 14–54)
ANION GAP: 11 (ref 5–15)
AST: 37 U/L (ref 15–41)
Albumin: 3.5 g/dL (ref 3.5–5.0)
Alkaline Phosphatase: 146 U/L — ABNORMAL HIGH (ref 38–126)
BILIRUBIN TOTAL: 0.7 mg/dL (ref 0.3–1.2)
BUN: 29 mg/dL — AB (ref 6–20)
CHLORIDE: 105 mmol/L (ref 101–111)
CO2: 22 mmol/L (ref 22–32)
Calcium: 8.9 mg/dL (ref 8.9–10.3)
Creatinine, Ser: 1.61 mg/dL — ABNORMAL HIGH (ref 0.44–1.00)
GFR calc Af Amer: 38 mL/min — ABNORMAL LOW (ref >60)
GFR, EST NON AFRICAN AMERICAN: 33 mL/min — AB (ref >60)
Glucose, Bld: 88 mg/dL (ref 65–99)
POTASSIUM: 3.3 mmol/L — AB (ref 3.5–5.1)
Sodium: 138 mmol/L (ref 135–145)
Total Protein: 7.1 g/dL (ref 6.5–8.1)

## 2015-12-02 LAB — URINALYSIS, ROUTINE W REFLEX MICROSCOPIC
Bilirubin Urine: NEGATIVE
GLUCOSE, UA: NEGATIVE mg/dL
HGB URINE DIPSTICK: NEGATIVE
Ketones, ur: NEGATIVE mg/dL
Nitrite: NEGATIVE
PROTEIN: NEGATIVE mg/dL
pH: 6 (ref 5.0–8.0)

## 2015-12-02 LAB — CBG MONITORING, ED
GLUCOSE-CAPILLARY: 94 mg/dL (ref 65–99)
Glucose-Capillary: 102 mg/dL — ABNORMAL HIGH (ref 65–99)

## 2015-12-02 LAB — URINE MICROSCOPIC-ADD ON

## 2015-12-02 LAB — I-STAT TROPONIN, ED: Troponin i, poc: 0 ng/mL (ref 0.00–0.08)

## 2015-12-02 LAB — TROPONIN I: Troponin I: <0.03 ng/mL (ref <0.03)

## 2015-12-02 MED ORDER — SODIUM CHLORIDE 0.9 % IV BOLUS (SEPSIS)
1000.0000 mL | Freq: Once | INTRAVENOUS | Status: AC
  Administered 2015-12-02: 1000 mL via INTRAVENOUS

## 2015-12-02 NOTE — Discharge Instructions (Signed)
Monitor your blood sugar closely today. Call Dr Almetta Lovely office tomorrow (today is a holiday) to let her know about this episode. She may want to adjust your medication.   Recheck if you feel worse.

## 2015-12-02 NOTE — ED Provider Notes (Signed)
[facility] DEPT Provider Note   CSN: [id number] Arrival date & time: 12/02/15  0302  Time seen 03:42 AM   History   Chief Complaint Chief Complaint  Patient presents with  . Generalized Body Aches    HPI [name] is a 65 y.o. female.  HPI she states she gets "spells", however last time she had an episode was about a year ago. She states it usually happens after she gets her insulin. She states tonight she took her insulin about 10:30 PM and then about 11:30 PM she started feeling bad. She states she started feeling cold and dizzy and had the shakes and states it lasted about an hour. She states she had nausea and vomiting 3 with dry heaves. She denies diaphoresis or diarrhea. She also reports all weeks she's had heaviness in her chest but is then told "many times my heart is fine". She does state she has a hole in her heart. She states she feels a little short of breath. She had chest pain tonight in the central portion of her chest that lasted about an hour. She denies any swelling of her extremities. She states she was very weak however that is improving. She states she also had some cramps in her hips that are getting better. She states she's been eating and drinking well. She states her endocrinologist changed her insulin about 5 weeks ago from 65 units in the morning down to 60 units and from 75 units in the evening down to 70 units.She states her blood sugar was 183 at home and states when it was checked in the ED it was 94.   PCP Dr Ashby Dawes Endocrinology Dr Bubba Camp Cardiology Dr Lattie Haw  Past Medical History:  Diagnosis Date  . Alkaline phosphatase elevation   . Anemia   . ASD (atrial septal defect)   . Cataract    both eyes hx of  . Chest pain    03-17-2013 last chest pain  . Chronic kidney disease    Stage III kidney disease  . Depression   . Diabetes mellitus type II   . Fatigue   . GERD (gastroesophageal reflux disease)   . Gout   . Headache(784.0)      occasional  . Hyperlipidemia   . Hypothyroidism   . Neuropathy ([place])   . Peripheral neuropathy ([place])   . Retinopathy     Patient Active Problem List   Diagnosis Date Noted  . Cholecystitis 04/05/2013  . DIABETES MELLITUS, TYPE II 02/05/2010  . CHEST PAIN 02/05/2010  . HYPOTHYROIDISM 03/20/2008  . HYPERLIPIDEMIA 03/20/2008  . RENAL DISEASE, CHRONIC, STAGE II 03/20/2008  . DEPRESSION 02/21/2008  . PERIPHERAL NEUROPATHY 02/21/2008  . CATARACTS 02/21/2008  . GERD 02/21/2008    Past Surgical History:  Procedure Laterality Date  . CERVICAL SPINE SURGERY  2006  . CHOLECYSTECTOMY N/A 04/07/2013   Procedure: LAPAROSCOPIC CHOLECYSTECTOMY WITH INTRAOPERATIVE CHOLANGIOGRAM;  Surgeon: Merrie Roof, MD;  Location: [facility];  Service: General;  Laterality: N/A;  . COMBINED HYSTERECTOMY VAGINAL / OOPHORECTOMY / A&P REPAIR  1987   Unilateral oophorectomy, h/o uterine prolapse has right ovary  . ERCP N/A 04/06/2013   Procedure: ENDOSCOPIC RETROGRADE CHOLANGIOPANCREATOGRAPHY (ERCP);  Surgeon: Beryle Beams, MD;  Location: [facility];  Service: Endoscopy;  Laterality: N/A;  . EUS N/A 03/31/2013   Procedure: UPPER ENDOSCOPIC ULTRASOUND (EUS) LINEAR;  Surgeon: Beryle Beams, MD;  Location: [facility];  Service: Endoscopy;  Laterality: N/A;  . SPINAL FUSION    .  TONSILLECTOMY  age 24    OB History    No data available       Home Medications    Prior to Admission medications   Medication Sig Start Date End Date Taking? Authorizing Provider  acetaminophen (TYLENOL) 500 MG tablet Take 500 mg by mouth every 6 (six) hours as needed for moderate pain.     Historical Provider, MD  allopurinol (ZYLOPRIM) 100 MG tablet Take 100 mg by mouth every evening.     Historical Provider, MD  docusate sodium (COLACE) 100 MG capsule Take 1 capsule (100 mg total) by mouth daily as needed for mild constipation. 04/10/13   Earnstine Regal, PA-C  hydrOXYzine (ATARAX/VISTARIL) 25 MG tablet Take 25 mg by mouth 3  (three) times daily as needed.    Historical Provider, MD  insulin NPH (HUMULIN N,NOVOLIN N) 100 UNIT/ML injection Inject 60-65 Units into the skin 2 (two) times daily. Takes 60 units in the morning and 70 units at night    Historical Provider, MD  insulin regular (NOVOLIN R,HUMULIN R) 100 units/mL injection Inject 5-20 Units into the skin 3 (three) times daily before meals.    Historical Provider, MD  levothyroxine (SYNTHROID, LEVOTHROID) 150 MCG tablet Take 150 mcg by mouth daily before breakfast.    Historical Provider, MD  omeprazole (PRILOSEC) 40 MG capsule Take 40 mg by mouth every morning.     Historical Provider, MD  ondansetron (ZOFRAN ODT) 4 MG disintegrating tablet Take 1 tablet (4 mg total) by mouth every 8 (eight) hours as needed for nausea or vomiting. 07/19/14   Delice Bison Ward, DO  oseltamivir (TAMIFLU) 75 MG capsule Take 1 capsule (75 mg total) by mouth every 12 (twelve) hours. 07/19/14   Kristen N Ward, DO  senna (SENOKOT) 8.6 MG TABS tablet Take 1 tablet (8.6 mg total) by mouth 2 (two) times daily. 04/10/13   Earnstine Regal, PA-C    Family History Family History  Problem Relation Age of Onset  . COPD Mother   . Lung cancer Father   . Diabetes Sister     Social History Social History  Substance Use Topics  . Smoking status: Never Smoker  . Smokeless tobacco: Never Used  . Alcohol use No  son lives with her   Allergies   Statins and Dilaudid [hydromorphone hcl]   Review of Systems Review of Systems  All other systems reviewed and are negative.    Physical Exam Updated Vital Signs BP (!) 115/50   Pulse 88   Temp 98.8 F (37.1 C) (Oral)   Resp 18   Ht 5\' 6"  (1.676 m)   Wt 230 lb (104.3 kg)   SpO2 94%   BMI 37.12 kg/m   Vital signs normal    Physical Exam  Constitutional: She is oriented to person, place, and time. She appears well-developed and well-nourished.  Non-toxic appearance. She does not appear ill. No distress.  HENT:  Head: Normocephalic  and atraumatic.  Right Ear: External ear normal.  Left Ear: External ear normal.  Nose: Nose normal. No mucosal edema or rhinorrhea.  Mouth/Throat: Oropharynx is clear and moist and mucous membranes are normal. No dental abscesses or uvula swelling.  Eyes: Conjunctivae and EOM are normal. Pupils are equal, round, and reactive to light.  Neck: Normal range of motion and full passive range of motion without pain. Neck supple.  Cardiovascular: Normal rate, regular rhythm and normal heart sounds.  Exam reveals no gallop and no friction rub.   No murmur  heard. Pulmonary/Chest: Effort normal and breath sounds normal. No respiratory distress. She has no wheezes. She has no rhonchi. She has no rales. She exhibits no tenderness and no crepitus.  Abdominal: Soft. Normal appearance and bowel sounds are normal. She exhibits no distension. There is no tenderness. There is no rebound and no guarding.  Musculoskeletal: Normal range of motion. She exhibits no edema or tenderness.  Moves all extremities well.   Neurological: She is alert and oriented to person, place, and time. She has normal strength. No cranial nerve deficit.  Skin: Skin is warm, dry and intact. No rash noted. No erythema. There is pallor.  Psychiatric: She has a normal mood and affect. Her speech is normal and behavior is normal. Her mood appears not anxious.  Nursing note and vitals reviewed.    ED Treatments / Results  Labs (all labs ordered are listed, but only abnormal results are displayed) Results for orders placed or performed during the hospital encounter of 12/02/15  Comprehensive metabolic panel  Result Value Ref Range   Sodium 138 135 - 145 mmol/L   Potassium 3.3 (L) 3.5 - 5.1 mmol/L   Chloride 105 101 - 111 mmol/L   CO2 22 22 - 32 mmol/L   Glucose, Bld 88 65 - 99 mg/dL   BUN 29 (H) 6 - 20 mg/dL   Creatinine, Ser 1.61 (H) 0.44 - 1.00 mg/dL   Calcium 8.9 8.9 - 10.3 mg/dL   Total Protein 7.1 6.5 - 8.1 g/dL   Albumin 3.5  3.5 - 5.0 g/dL   AST 37 15 - 41 U/L   ALT 23 14 - 54 U/L   Alkaline Phosphatase 146 (H) 38 - 126 U/L   Total Bilirubin 0.7 0.3 - 1.2 mg/dL   GFR calc non Af Amer 33 (L) >60 mL/min   GFR calc Af Amer 38 (L) >60 mL/min   Anion gap 11 5 - 15  CBC with Differential  Result Value Ref Range   WBC 7.5 4.0 - 10.5 K/uL   RBC 4.01 3.87 - 5.11 MIL/uL   Hemoglobin 11.3 (L) 12.0 - 15.0 g/dL   HCT 35.8 (L) 36.0 - 46.0 %   MCV 89.3 78.0 - 100.0 fL   MCH 28.2 26.0 - 34.0 pg   MCHC 31.6 30.0 - 36.0 g/dL   RDW 15.7 (H) 11.5 - 15.5 %   Platelets 105 (L) 150 - 400 K/uL   Neutrophils Relative % 85 %   Neutro Abs 6.4 1.7 - 7.7 K/uL   Lymphocytes Relative 13 %   Lymphs Abs 1.0 0.7 - 4.0 K/uL   Monocytes Relative 1 %   Monocytes Absolute 0.1 0.1 - 1.0 K/uL   Eosinophils Relative 1 %   Eosinophils Absolute 0.1 0.0 - 0.7 K/uL   Basophils Relative 0 %   Basophils Absolute 0.0 0.0 - 0.1 K/uL  Urinalysis, Routine w reflex microscopic  Result Value Ref Range   Color, Urine YELLOW YELLOW   APPearance CLEAR CLEAR   Specific Gravity, Urine <1.005 (L) 1.005 - 1.030   pH 6.0 5.0 - 8.0   Glucose, UA NEGATIVE NEGATIVE mg/dL   Hgb urine dipstick NEGATIVE NEGATIVE   Bilirubin Urine NEGATIVE NEGATIVE   Ketones, ur NEGATIVE NEGATIVE mg/dL   Protein, ur NEGATIVE NEGATIVE mg/dL   Nitrite NEGATIVE NEGATIVE   Leukocytes, UA TRACE (A) NEGATIVE  Troponin I  Result Value Ref Range   Troponin I <0.03 <0.03 ng/mL  Urine microscopic-add on  Result Value Ref Range  Squamous Epithelial / LPF TOO NUMEROUS TO COUNT (A) NONE SEEN   WBC, UA 0-5 0 - 5 WBC/hpf   RBC / HPF 0-5 0 - 5 RBC/hpf   Bacteria, UA MANY (A) NONE SEEN  CBG monitoring, ED  Result Value Ref Range   Glucose-Capillary 94 65 - 99 mg/dL  I-Stat Troponin, ED (not at [facility])  Result Value Ref Range   Troponin i, poc 0.00 0.00 - 0.08 ng/mL   Comment 3          CBG monitoring, ED  Result Value Ref Range   Glucose-Capillary 102 (H) 65 - 99 mg/dL    Laboratory interpretation all normal except Renal insufficiency, mild anemia, hypokalemia, contaminated UA    EKG  EKG Interpretation  Date/Time:  Monday December 02 2015 04:07:41 EDT Ventricular Rate:  85 PR Interval:    QRS Duration: 136 QT Interval:  400 QTC Calculation: 476 R Axis:   -40 Text Interpretation:  Sinus rhythm IVCD, consider atypical RBBB LVH with IVCD and secondary repol abnrm No significant change since last tracing 27 Oct 2013 Confirmed by [place]  MD-I, Rhilynn Preyer ([id number]) on 12/02/2015 4:39:20 AM       Radiology No results found.  Procedures Procedures (including critical care time)  Medications Ordered in ED Medications  sodium chloride 0.9 % bolus 1,000 mL (0 mLs Intravenous Stopped 12/02/15 0557)     Initial Impression / Assessment and Plan / ED Course  I have reviewed the triage vital signs and the nursing notes.  Pertinent labs & imaging results that were available during my care of the patient were reviewed by me and considered in my medical decision making (see chart for details).  Clinical Course   While waiting for her laboratory results patient was given IV saline. EKG and troponin was checked.  Recheck at 6 AM patient states she's doing better. She's been ambulatory to the bathroom with no problems. A delta troponin was drawn. If that is normal she will be discharged home. She was advised to follow-up with her endocrinologist to let her know about this episode tonight.  6:40 AM patient's delta troponin is negative. Her CBG is 102. She has remained stable in the ED and feels better. I am not sure the etiology of this episode although she feels she related to a rapid drop of her blood sugar which indeed it may be. She was advised to talk to her endocrinologist office tomorrow to let them know about this episode and see if they want to adjust her medications. She is to monitor her blood sugars closely today and be rechecked if she feels worse.  Final  Clinical Impressions(s) / ED Diagnoses   Final diagnoses:  Shaking spells   Plan discharge  Rolland Porter, MD, Barbette Or, MD 12/02/15 [phone]

## 2015-12-02 NOTE — ED Notes (Signed)
EKG completed and given to Dr. Tomi Bamberger

## 2015-12-02 NOTE — ED Notes (Signed)
Pt ambulated to restroom independently with no complaints

## 2015-12-02 NOTE — ED Triage Notes (Signed)
Pt c/o dizziness and n/v and body aches that started at 2130 last evening

## 2015-12-10 DIAGNOSIS — N183 Chronic kidney disease, stage 3 (moderate): Secondary | ICD-10-CM | POA: Diagnosis not present

## 2015-12-10 DIAGNOSIS — Z0000 Encounter for general adult medical examination without abnormal findings: Secondary | ICD-10-CM | POA: Diagnosis not present

## 2015-12-10 DIAGNOSIS — E1165 Type 2 diabetes mellitus with hyperglycemia: Secondary | ICD-10-CM | POA: Diagnosis not present

## 2015-12-10 DIAGNOSIS — E7800 Pure hypercholesterolemia, unspecified: Secondary | ICD-10-CM | POA: Diagnosis not present

## 2015-12-19 DIAGNOSIS — Z780 Asymptomatic menopausal state: Secondary | ICD-10-CM | POA: Diagnosis not present

## 2015-12-19 DIAGNOSIS — Z23 Encounter for immunization: Secondary | ICD-10-CM | POA: Diagnosis not present

## 2015-12-19 DIAGNOSIS — E7800 Pure hypercholesterolemia, unspecified: Secondary | ICD-10-CM | POA: Diagnosis not present

## 2015-12-19 DIAGNOSIS — Z0000 Encounter for general adult medical examination without abnormal findings: Secondary | ICD-10-CM | POA: Diagnosis not present

## 2015-12-19 DIAGNOSIS — E1165 Type 2 diabetes mellitus with hyperglycemia: Secondary | ICD-10-CM | POA: Diagnosis not present

## 2015-12-19 DIAGNOSIS — N183 Chronic kidney disease, stage 3 (moderate): Secondary | ICD-10-CM | POA: Diagnosis not present

## 2015-12-23 ENCOUNTER — Ambulatory Visit (INDEPENDENT_AMBULATORY_CARE_PROVIDER_SITE_OTHER): Payer: Medicare Other | Admitting: Ophthalmology

## 2015-12-23 DIAGNOSIS — E113391 Type 2 diabetes mellitus with moderate nonproliferative diabetic retinopathy without macular edema, right eye: Secondary | ICD-10-CM | POA: Diagnosis not present

## 2015-12-23 DIAGNOSIS — E113512 Type 2 diabetes mellitus with proliferative diabetic retinopathy with macular edema, left eye: Secondary | ICD-10-CM | POA: Diagnosis not present

## 2015-12-23 DIAGNOSIS — H43813 Vitreous degeneration, bilateral: Secondary | ICD-10-CM

## 2015-12-23 DIAGNOSIS — E11311 Type 2 diabetes mellitus with unspecified diabetic retinopathy with macular edema: Secondary | ICD-10-CM

## 2015-12-23 DIAGNOSIS — N183 Chronic kidney disease, stage 3 (moderate): Secondary | ICD-10-CM | POA: Diagnosis not present

## 2015-12-23 DIAGNOSIS — E889 Metabolic disorder, unspecified: Secondary | ICD-10-CM | POA: Diagnosis not present

## 2015-12-30 DIAGNOSIS — E113592 Type 2 diabetes mellitus with proliferative diabetic retinopathy without macular edema, left eye: Secondary | ICD-10-CM | POA: Diagnosis not present

## 2015-12-30 DIAGNOSIS — H04123 Dry eye syndrome of bilateral lacrimal glands: Secondary | ICD-10-CM | POA: Diagnosis not present

## 2015-12-30 DIAGNOSIS — H52222 Regular astigmatism, left eye: Secondary | ICD-10-CM | POA: Diagnosis not present

## 2015-12-30 DIAGNOSIS — E113491 Type 2 diabetes mellitus with severe nonproliferative diabetic retinopathy without macular edema, right eye: Secondary | ICD-10-CM | POA: Diagnosis not present

## 2015-12-30 LAB — HM DIABETES EYE EXAM

## 2016-01-01 DIAGNOSIS — I129 Hypertensive chronic kidney disease with stage 1 through stage 4 chronic kidney disease, or unspecified chronic kidney disease: Secondary | ICD-10-CM | POA: Diagnosis not present

## 2016-01-01 DIAGNOSIS — M9080 Osteopathy in diseases classified elsewhere, unspecified site: Secondary | ICD-10-CM | POA: Diagnosis not present

## 2016-01-01 DIAGNOSIS — E889 Metabolic disorder, unspecified: Secondary | ICD-10-CM | POA: Diagnosis not present

## 2016-01-01 DIAGNOSIS — D631 Anemia in chronic kidney disease: Secondary | ICD-10-CM | POA: Diagnosis not present

## 2016-01-01 DIAGNOSIS — N183 Chronic kidney disease, stage 3 (moderate): Secondary | ICD-10-CM | POA: Diagnosis not present

## 2016-02-11 DIAGNOSIS — E039 Hypothyroidism, unspecified: Secondary | ICD-10-CM | POA: Diagnosis not present

## 2016-02-11 DIAGNOSIS — E11319 Type 2 diabetes mellitus with unspecified diabetic retinopathy without macular edema: Secondary | ICD-10-CM | POA: Diagnosis not present

## 2016-02-11 DIAGNOSIS — N183 Chronic kidney disease, stage 3 (moderate): Secondary | ICD-10-CM | POA: Diagnosis not present

## 2016-02-11 DIAGNOSIS — E1165 Type 2 diabetes mellitus with hyperglycemia: Secondary | ICD-10-CM | POA: Diagnosis not present

## 2016-03-03 ENCOUNTER — Emergency Department (HOSPITAL_COMMUNITY)
Admission: EM | Admit: 2016-03-03 | Discharge: 2016-03-03 | Disposition: A | Discharge status: Home / Self Care | Payer: Medicare Other | Attending: Emergency Medicine | Admitting: Emergency Medicine

## 2016-03-03 ENCOUNTER — Encounter (HOSPITAL_COMMUNITY): Payer: Self-pay

## 2016-03-03 ENCOUNTER — Emergency Department (HOSPITAL_COMMUNITY): Payer: Medicare Other

## 2016-03-03 DIAGNOSIS — E1122 Type 2 diabetes mellitus with diabetic chronic kidney disease: Secondary | ICD-10-CM | POA: Insufficient documentation

## 2016-03-03 DIAGNOSIS — Z794 Long term (current) use of insulin: Secondary | ICD-10-CM | POA: Diagnosis not present

## 2016-03-03 DIAGNOSIS — E860 Dehydration: Secondary | ICD-10-CM

## 2016-03-03 DIAGNOSIS — E162 Hypoglycemia, unspecified: Secondary | ICD-10-CM

## 2016-03-03 DIAGNOSIS — N183 Chronic kidney disease, stage 3 (moderate): Secondary | ICD-10-CM | POA: Diagnosis not present

## 2016-03-03 DIAGNOSIS — Z79899 Other long term (current) drug therapy: Secondary | ICD-10-CM | POA: Diagnosis not present

## 2016-03-03 DIAGNOSIS — R531 Weakness: Secondary | ICD-10-CM | POA: Diagnosis not present

## 2016-03-03 DIAGNOSIS — E039 Hypothyroidism, unspecified: Secondary | ICD-10-CM | POA: Insufficient documentation

## 2016-03-03 DIAGNOSIS — R404 Transient alteration of awareness: Secondary | ICD-10-CM | POA: Diagnosis not present

## 2016-03-03 DIAGNOSIS — E11649 Type 2 diabetes mellitus with hypoglycemia without coma: Secondary | ICD-10-CM | POA: Diagnosis not present

## 2016-03-03 DIAGNOSIS — R918 Other nonspecific abnormal finding of lung field: Secondary | ICD-10-CM | POA: Diagnosis not present

## 2016-03-03 LAB — CBG MONITORING, ED
Glucose-Capillary: 49 mg/dL — ABNORMAL LOW (ref 65–99)
Glucose-Capillary: 160 mg/dL — ABNORMAL HIGH (ref 65–99)
Glucose-Capillary: 130 mg/dL — ABNORMAL HIGH (ref 65–99)

## 2016-03-03 LAB — URINALYSIS, ROUTINE W REFLEX MICROSCOPIC
Bilirubin Urine: NEGATIVE
GLUCOSE, UA: NEGATIVE mg/dL
Hgb urine dipstick: NEGATIVE
Ketones, ur: NEGATIVE mg/dL
LEUKOCYTES UA: NEGATIVE
Nitrite: NEGATIVE
PH: 5.5 (ref 5.0–8.0)
Protein, ur: NEGATIVE mg/dL
SPECIFIC GRAVITY, URINE: 1.01 (ref 1.005–1.030)

## 2016-03-03 LAB — CBC WITH DIFFERENTIAL/PLATELET
Basophils Absolute: 0 10*3/uL (ref 0.0–0.1)
Basophils Relative: 0 %
Eosinophils Absolute: 0 10*3/uL (ref 0.0–0.7)
Eosinophils Relative: 0 %
HEMATOCRIT: 32.8 % — AB (ref 36.0–46.0)
Hemoglobin: 9.9 g/dL — ABNORMAL LOW (ref 12.0–15.0)
Lymphocytes Relative: 5 %
Lymphs Abs: 0.5 10*3/uL — ABNORMAL LOW (ref 0.7–4.0)
MCH: 26.9 pg (ref 26.0–34.0)
MCHC: 30.2 g/dL (ref 30.0–36.0)
MCV: 89.1 fL (ref 78.0–100.0)
MONO ABS: 0.2 10*3/uL (ref 0.1–1.0)
Monocytes Relative: 2 %
Neutro Abs: 11.1 10*3/uL — ABNORMAL HIGH (ref 1.7–7.7)
Neutrophils Relative %: 93 %
Platelets: 110 10*3/uL — ABNORMAL LOW (ref 150–400)
RBC: 3.68 MIL/uL — ABNORMAL LOW (ref 3.87–5.11)
RDW: 15.5 % (ref 11.5–15.5)
WBC: 11.9 10*3/uL — ABNORMAL HIGH (ref 4.0–10.5)

## 2016-03-03 LAB — BASIC METABOLIC PANEL
Anion gap: 9 (ref 5–15)
BUN: 25 mg/dL — ABNORMAL HIGH (ref 6–20)
CHLORIDE: 110 mmol/L (ref 101–111)
CO2: 19 mmol/L — ABNORMAL LOW (ref 22–32)
Calcium: 8.7 mg/dL — ABNORMAL LOW (ref 8.9–10.3)
Creatinine, Ser: 1.76 mg/dL — ABNORMAL HIGH (ref 0.44–1.00)
GFR calc Af Amer: 34 mL/min — ABNORMAL LOW (ref >60)
GFR calc non Af Amer: 29 mL/min — ABNORMAL LOW (ref >60)
GLUCOSE: 125 mg/dL — AB (ref 65–99)
POTASSIUM: 3.8 mmol/L (ref 3.5–5.1)
Sodium: 138 mmol/L (ref 135–145)

## 2016-03-03 MED ORDER — DEXTROSE 50 % IV SOLN: 1.0000 | Freq: Once | INTRAVENOUS | Status: DC

## 2016-03-03 MED ORDER — SODIUM CHLORIDE 0.9 % IV BOLUS (SEPSIS)
1000.0000 mL | Freq: Once | INTRAVENOUS | Status: AC
  Administered 2016-03-03: 1000 mL via INTRAVENOUS

## 2016-03-03 NOTE — ED Notes (Signed)
Patient given orange juice with sugar. States her nausea is better.

## 2016-03-03 NOTE — ED Notes (Signed)
Pt given meal to eat and sprite zero to drink. Family member given drink and crackers with peanut butter.

## 2016-03-03 NOTE — Discharge Instructions (Signed)
Half your insulin dose until discussing your ED visit with your doctor  Drink at least 6-8 glasses of water/day for the next 2-3 days  Consider discussing insulin Lantus and Lispro with your doctor, as your kidney function is borderline for some insulin products

## 2016-03-03 NOTE — ED Triage Notes (Signed)
Patient states she woke up and glucose was 144. Took regular morning medications then began to start to feel bad. EMS took glucose and was 78. Reports of nausea at this time.

## 2016-03-03 NOTE — ED Notes (Signed)
CBG 130 

## 2016-03-03 NOTE — ED Provider Notes (Signed)
[facility] DEPT Provider Note   CSN: [id number] Arrival date & time: 03/03/16  1427     History   Chief Complaint Chief Complaint  Patient presents with  . Hypoglycemia    HPI [name] is a 65 y.o. female.  HPI 65 year old female with past medical history as below including recurrent episodes of hypoglycemia here with symptomatic hypoglycemia. Patient states she was in her usual state of health upon awakening this morning. She took her blood sugar and it was in the 140s. She sat down to eat toast and eggs and gave herself an appropriate sliding scale insulin dose. She states that immediately after giving herself a dose, she began to feel nauseous. She also felt lightheaded. Denies any chest pain or syncope. She checked her blood sugar and it was 170, which is lower than usual after eating. She subsequent presents for evaluation. She does admit that she has had mildly decreased by mouth intake over the last week due to mild nausea. Denies any chest pain. No diarrhea. No fevers or chills. She takes 60 units of insulin twice a day and has not missed a dose or increased her dose. Of note, her insulin has been adjusted by her PCP recently due to her fluctuating blood sugars.  Past Medical History:  Diagnosis Date  . Alkaline phosphatase elevation   . Anemia   . ASD (atrial septal defect)   . Cataract    both eyes hx of  . Chest pain    03-17-2013 last chest pain  . Chronic kidney disease    Stage III kidney disease  . Depression   . Diabetes mellitus type II   . Fatigue   . GERD (gastroesophageal reflux disease)   . Gout   . Headache(784.0)    occasional  . Hyperlipidemia   . Hypothyroidism   . Neuropathy ([place])   . Peripheral neuropathy ([place])   . Retinopathy     Patient Active Problem List   Diagnosis Date Noted  . Cholecystitis 04/05/2013  . DIABETES MELLITUS, TYPE II 02/05/2010  . CHEST PAIN 02/05/2010  . HYPOTHYROIDISM 03/20/2008  . HYPERLIPIDEMIA 03/20/2008    . RENAL DISEASE, CHRONIC, STAGE II 03/20/2008  . DEPRESSION 02/21/2008  . PERIPHERAL NEUROPATHY 02/21/2008  . CATARACTS 02/21/2008  . GERD 02/21/2008    Past Surgical History:  Procedure Laterality Date  . CERVICAL SPINE SURGERY  2006  . CHOLECYSTECTOMY N/A 04/07/2013   Procedure: LAPAROSCOPIC CHOLECYSTECTOMY WITH INTRAOPERATIVE CHOLANGIOGRAM;  Surgeon: Merrie Roof, MD;  Location: [facility];  Service: General;  Laterality: N/A;  . COMBINED HYSTERECTOMY VAGINAL / OOPHORECTOMY / A&P REPAIR  1987   Unilateral oophorectomy, h/o uterine prolapse has right ovary  . ERCP N/A 04/06/2013   Procedure: ENDOSCOPIC RETROGRADE CHOLANGIOPANCREATOGRAPHY (ERCP);  Surgeon: Beryle Beams, MD;  Location: [facility];  Service: Endoscopy;  Laterality: N/A;  . EUS N/A 03/31/2013   Procedure: UPPER ENDOSCOPIC ULTRASOUND (EUS) LINEAR;  Surgeon: Beryle Beams, MD;  Location: [facility];  Service: Endoscopy;  Laterality: N/A;  . SPINAL FUSION    . TONSILLECTOMY  age 33    OB History    No data available       Home Medications    Prior to Admission medications   Medication Sig Start Date End Date Taking? Authorizing Provider  acetaminophen (TYLENOL) 500 MG tablet Take 500 mg by mouth every 6 (six) hours as needed for moderate pain.    Yes Historical Provider, MD  allopurinol (ZYLOPRIM) 100 MG  tablet Take 100 mg by mouth every evening.    Yes Historical Provider, MD  docusate sodium (COLACE) 100 MG capsule Take 1 capsule (100 mg total) by mouth daily as needed for mild constipation. 04/10/13  Yes Earnstine Regal, PA-C  hydrOXYzine (ATARAX/VISTARIL) 25 MG tablet Take 25 mg by mouth 3 (three) times daily as needed for anxiety or itching (Helps with neuropathy).    Yes Historical Provider, MD  insulin NPH Human (HUMULIN N,NOVOLIN N) 100 UNIT/ML injection Inject 60 Units into the skin 2 (two) times daily.   Yes Historical Provider, MD  insulin regular (NOVOLIN R,HUMULIN R) 100 units/mL injection Inject 5-20  Units into the skin 3 (three) times daily before meals.   Yes Historical Provider, MD  levothyroxine (SYNTHROID, LEVOTHROID) 150 MCG tablet Take 150 mcg by mouth daily before breakfast.   Yes Historical Provider, MD  omeprazole (PRILOSEC) 40 MG capsule Take 40 mg by mouth every morning.    Yes Historical Provider, MD    Family History Family History  Problem Relation Age of Onset  . COPD Mother   . Lung cancer Father   . Diabetes Sister     Social History Social History  Substance Use Topics  . Smoking status: Never Smoker  . Smokeless tobacco: Never Used  . Alcohol use No     Allergies   Statins and Dilaudid [hydromorphone hcl]   Review of Systems Review of Systems  Constitutional: Positive for chills and fatigue. Negative for fever.  HENT: Negative for congestion, rhinorrhea and sore throat.   Eyes: Negative for visual disturbance.  Respiratory: Negative for cough, shortness of breath and wheezing.   Cardiovascular: Negative for chest pain and leg swelling.  Gastrointestinal: Positive for nausea. Negative for abdominal pain, diarrhea and vomiting.  Genitourinary: Negative for dysuria, flank pain, vaginal bleeding and vaginal discharge.  Musculoskeletal: Negative for neck pain.  Skin: Negative for rash.  Allergic/Immunologic: Negative for immunocompromised state.  Neurological: Negative for syncope and headaches.  Hematological: Does not bruise/bleed easily.  All other systems reviewed and are negative.    Physical Exam Updated Vital Signs BP (!) 114/46   Pulse 84   Temp 98.7 F (37.1 C) (Oral)   Resp 21   Ht 5' 6"  (1.676 m)   Wt 230 lb (104.3 kg)   SpO2 96%   BMI 37.12 kg/m   Physical Exam  Constitutional: She is oriented to person, place, and time. She appears well-developed and well-nourished. No distress.  HENT:  Head: Normocephalic and atraumatic.  Eyes: Conjunctivae are normal.  Neck: Neck supple.  Cardiovascular: Normal rate, regular rhythm and  normal heart sounds.  Exam reveals no friction rub.   No murmur heard. Pulmonary/Chest: Effort normal and breath sounds normal. No respiratory distress. She has no wheezes. She has no rales.  Abdominal: She exhibits no distension.  Musculoskeletal: She exhibits no edema.  Neurological: She is alert and oriented to person, place, and time. She exhibits normal muscle tone.  Skin: Skin is warm. Capillary refill takes less than 2 seconds.  Psychiatric: She has a normal mood and affect.  Nursing note and vitals reviewed.    ED Treatments / Results  Labs (all labs ordered are listed, but only abnormal results are displayed) Labs Reviewed  CBC WITH DIFFERENTIAL/PLATELET - Abnormal; Notable for the following:       Result Value   WBC 11.9 (*)    RBC 3.68 (*)    Hemoglobin 9.9 (*)    HCT 32.8 (*)  Platelets 110 (*)    Neutro Abs 11.1 (*)    Lymphs Abs 0.5 (*)    All other components within normal limits  BASIC METABOLIC PANEL - Abnormal; Notable for the following:    CO2 19 (*)    Glucose, Bld 125 (*)    BUN 25 (*)    Creatinine, Ser 1.76 (*)    Calcium 8.7 (*)    GFR calc non Af Amer 29 (*)    GFR calc Af Amer 34 (*)    All other components within normal limits  CBG MONITORING, ED - Abnormal; Notable for the following:    Glucose-Capillary 49 (*)    All other components within normal limits  CBG MONITORING, ED - Abnormal; Notable for the following:    Glucose-Capillary 160 (*)    All other components within normal limits  CBG MONITORING, ED - Abnormal; Notable for the following:    Glucose-Capillary 130 (*)    All other components within normal limits  URINALYSIS, ROUTINE W REFLEX MICROSCOPIC    EKG  EKG Interpretation  Date/Time:  Tuesday March 03 2016 16:35:47 EST Ventricular Rate:  85 PR Interval:    QRS Duration: 139 QT Interval:  402 QTC Calculation: 478 R Axis:   -45 Text Interpretation:  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  Abnormal feeling. EXAM: CHEST  2 VIEW COMPARISON:  07/19/2014 FINDINGS: The heart size and mediastinal contours are within normal limits. Both lungs are clear. The visualized skeletal structures are unremarkable. IMPRESSION: No active cardiopulmonary disease. Electronically Signed   By: Nelson Chimes M.D.   On: 03/03/2016 17:09    Procedures Procedures (including critical care time)  Medications Ordered in ED Medications  sodium chloride 0.9 % bolus 1,000 mL (0 mLs Intravenous Stopped 03/03/16 2022)     Initial Impression / Assessment and Plan / ED Course  I have reviewed the triage vital signs and the nursing notes.  Pertinent labs & imaging results that were available during my care of the patient were reviewed by me and considered in my medical decision making (see chart for details).  Clinical Course     65 year old female with history of recurrent hypoglycemia here with transient, mildly symptomatic hypoglycemia in the setting of insulin use. Patient denies any intentional insulin overuse. She is not on any oral hypoglycemics. On arrival, blood sugar initially in the 40s but this improved with crackers and juice. Labwork shows mild acute on chronic kidney injury, likely secondary to mild dehydration. I do not suspect this has significantly impaired her insulin clearance. Otherwise, screening labwork is unremarkable. There is no evidence of infection as etiology for hypoglycemia and patient has known brittle diabetes with similar symptoms. Her EKG is nonischemic and I do not suspect ACS as etiology for hypoglycemia. She was monitored in the ED for several hours with stable blood sugars and was hemodynamically stable and asymptomatic. I will advise her to half her insulin dose, notify her PCP in  the morning for further adjustments, and encourage fluid intake.  Final Clinical Impressions(s) / ED Diagnoses   Final diagnoses:  Dehydration  Hypoglycemia    New Prescriptions Discharge Medication List as of 03/03/2016  8:38 PM       Duffy Bruce, MD 03/04/16 1303

## 2016-03-05 DIAGNOSIS — E039 Hypothyroidism, unspecified: Secondary | ICD-10-CM | POA: Diagnosis not present

## 2016-03-05 DIAGNOSIS — E7800 Pure hypercholesterolemia, unspecified: Secondary | ICD-10-CM | POA: Diagnosis not present

## 2016-03-05 DIAGNOSIS — E1165 Type 2 diabetes mellitus with hyperglycemia: Secondary | ICD-10-CM | POA: Diagnosis not present

## 2016-06-11 DIAGNOSIS — E1165 Type 2 diabetes mellitus with hyperglycemia: Secondary | ICD-10-CM | POA: Diagnosis not present

## 2016-06-11 DIAGNOSIS — R251 Tremor, unspecified: Secondary | ICD-10-CM | POA: Diagnosis not present

## 2016-06-11 DIAGNOSIS — E039 Hypothyroidism, unspecified: Secondary | ICD-10-CM | POA: Diagnosis not present

## 2016-06-11 DIAGNOSIS — N183 Chronic kidney disease, stage 3 (moderate): Secondary | ICD-10-CM | POA: Diagnosis not present

## 2016-06-22 ENCOUNTER — Ambulatory Visit (INDEPENDENT_AMBULATORY_CARE_PROVIDER_SITE_OTHER): Payer: Medicare Other | Admitting: Ophthalmology

## 2016-06-22 DIAGNOSIS — E113592 Type 2 diabetes mellitus with proliferative diabetic retinopathy without macular edema, left eye: Secondary | ICD-10-CM

## 2016-06-22 DIAGNOSIS — E11319 Type 2 diabetes mellitus with unspecified diabetic retinopathy without macular edema: Secondary | ICD-10-CM

## 2016-06-22 DIAGNOSIS — I10 Essential (primary) hypertension: Secondary | ICD-10-CM | POA: Diagnosis not present

## 2016-06-22 DIAGNOSIS — E113391 Type 2 diabetes mellitus with moderate nonproliferative diabetic retinopathy without macular edema, right eye: Secondary | ICD-10-CM

## 2016-06-22 DIAGNOSIS — H35033 Hypertensive retinopathy, bilateral: Secondary | ICD-10-CM | POA: Diagnosis not present

## 2016-06-22 DIAGNOSIS — H43813 Vitreous degeneration, bilateral: Secondary | ICD-10-CM | POA: Diagnosis not present

## 2016-07-02 DIAGNOSIS — N183 Chronic kidney disease, stage 3 (moderate): Secondary | ICD-10-CM | POA: Diagnosis not present

## 2016-07-02 DIAGNOSIS — E7800 Pure hypercholesterolemia, unspecified: Secondary | ICD-10-CM | POA: Diagnosis not present

## 2016-07-09 DIAGNOSIS — E7800 Pure hypercholesterolemia, unspecified: Secondary | ICD-10-CM | POA: Diagnosis not present

## 2016-07-09 DIAGNOSIS — N183 Chronic kidney disease, stage 3 (moderate): Secondary | ICD-10-CM | POA: Diagnosis not present

## 2016-07-09 DIAGNOSIS — E11319 Type 2 diabetes mellitus with unspecified diabetic retinopathy without macular edema: Secondary | ICD-10-CM | POA: Diagnosis not present

## 2016-07-09 DIAGNOSIS — E1165 Type 2 diabetes mellitus with hyperglycemia: Secondary | ICD-10-CM | POA: Diagnosis not present

## 2016-07-15 DIAGNOSIS — N189 Chronic kidney disease, unspecified: Secondary | ICD-10-CM | POA: Diagnosis not present

## 2016-07-15 DIAGNOSIS — N183 Chronic kidney disease, stage 3 (moderate): Secondary | ICD-10-CM | POA: Diagnosis not present

## 2016-07-15 DIAGNOSIS — E889 Metabolic disorder, unspecified: Secondary | ICD-10-CM | POA: Diagnosis not present

## 2016-07-16 DIAGNOSIS — D631 Anemia in chronic kidney disease: Secondary | ICD-10-CM | POA: Diagnosis not present

## 2016-07-16 DIAGNOSIS — E889 Metabolic disorder, unspecified: Secondary | ICD-10-CM | POA: Diagnosis not present

## 2016-07-16 DIAGNOSIS — I129 Hypertensive chronic kidney disease with stage 1 through stage 4 chronic kidney disease, or unspecified chronic kidney disease: Secondary | ICD-10-CM | POA: Diagnosis not present

## 2016-07-16 DIAGNOSIS — N183 Chronic kidney disease, stage 3 (moderate): Secondary | ICD-10-CM | POA: Diagnosis not present

## 2016-07-16 DIAGNOSIS — M9080 Osteopathy in diseases classified elsewhere, unspecified site: Secondary | ICD-10-CM | POA: Diagnosis not present

## 2016-07-22 ENCOUNTER — Telehealth: Payer: Self-pay | Admitting: Neurology

## 2016-07-22 ENCOUNTER — Ambulatory Visit: Payer: Medicare Other | Admitting: Neurology

## 2016-07-22 NOTE — Telephone Encounter (Signed)
The patient canceled the same day of the appointment.

## 2016-08-06 ENCOUNTER — Encounter: Payer: Self-pay | Admitting: Neurology

## 2016-08-06 ENCOUNTER — Ambulatory Visit (INDEPENDENT_AMBULATORY_CARE_PROVIDER_SITE_OTHER): Payer: Medicare Other | Admitting: Neurology

## 2016-08-06 VITALS — BP 127/69 | HR 80 | Ht 66.0 in | Wt 226.0 lb

## 2016-08-06 DIAGNOSIS — R251 Tremor, unspecified: Secondary | ICD-10-CM

## 2016-08-06 MED ORDER — ALPRAZOLAM 0.25 MG PO TABS: 0.2500 mg | ORAL_TABLET | Freq: Three times a day (TID) | ORAL | 2 refills | Status: DC | PRN

## 2016-08-06 NOTE — Progress Notes (Signed)
Reason for visit: Tremor  Referring physician: Dr. [name] is a 66 y.o. female  History of present illness:  [name] is a 66 year old left-handed white female with a history of diabetes and a diabetic peripheral neuropathy. The patient has had onset about one year ago of episodic tremors. The patient may have tremors that last 30 to 60 minutes at a time, sometimes the tremors are more severe and may last up to 3 hours. The patient may have tremors affecting mainly the arms, but sometimes she may have tremors that are throughout the body, she feels like her insides are shaking. She may have increased heart rate and shortness of breath with these events. On occasion, she has correlated these tremors with a drop in blood sugar, but this is not always the case. The last such episode of low blood sugar was in December 2017. The patient also reports that if she is performing tasks that require fine motor control she may have some tremor of the hands. The patient indicates that her paternal grandmother and grandfather had Parkinson's disease, a paternal uncle had tremors throughout his entire life. The patient does have some numbness in the feet, at times she may have some transient gait instability she stands up from a chair and begins to walk. This gait instability is not present at all times. She denies issues controlling the bowels or the bladder, she denies any syncopal events. She is sent to this office for an evaluation.  Past Medical History:  Diagnosis Date  . Alkaline phosphatase elevation   . Anemia   . ASD (atrial septal defect)   . Cataract    both eyes hx of  . Chest pain    03-17-2013 last chest pain  . Chronic kidney disease    Stage III kidney disease  . Depression   . Diabetes mellitus type II   . Fatigue   . GERD (gastroesophageal reflux disease)   . Gout   . Headache(784.0)    occasional  . Hyperlipidemia   . Hypothyroidism   . Neuropathy   .  Peripheral neuropathy   . Retinopathy     Past Surgical History:  Procedure Laterality Date  . CERVICAL SPINE SURGERY  2006  . CHOLECYSTECTOMY N/A 04/07/2013   Procedure: LAPAROSCOPIC CHOLECYSTECTOMY WITH INTRAOPERATIVE CHOLANGIOGRAM;  Surgeon: Merrie Roof, MD;  Location: [facility];  Service: General;  Laterality: N/A;  . COMBINED HYSTERECTOMY VAGINAL / OOPHORECTOMY / A&P REPAIR  1987   Unilateral oophorectomy, h/o uterine prolapse has right ovary  . ERCP N/A 04/06/2013   Procedure: ENDOSCOPIC RETROGRADE CHOLANGIOPANCREATOGRAPHY (ERCP);  Surgeon: Beryle Beams, MD;  Location: [facility];  Service: Endoscopy;  Laterality: N/A;  . EUS N/A 03/31/2013   Procedure: UPPER ENDOSCOPIC ULTRASOUND (EUS) LINEAR;  Surgeon: Beryle Beams, MD;  Location: [facility];  Service: Endoscopy;  Laterality: N/A;  . SPINAL FUSION    . TONSILLECTOMY  age 15    Family History  Problem Relation Age of Onset  . COPD Mother   . Lung cancer Father   . Diabetes Sister     Social history:  reports that she has never smoked. She has never used smokeless tobacco. She reports that she does not drink alcohol or use drugs.  Medications:  Prior to Admission medications   Medication Sig Start Date End Date Taking? Authorizing Provider  acetaminophen (TYLENOL) 500 MG tablet Take 500 mg by mouth every 6 (six) hours as needed  for moderate pain.    Yes [provider]  allopurinol (ZYLOPRIM) 100 MG tablet Take 100 mg by mouth every evening.    Yes [provider]  hydrOXYzine (ATARAX/VISTARIL) 25 MG tablet Take 25 mg by mouth 3 (three) times daily as needed for anxiety or itching (Helps with neuropathy).    Yes [provider]  Insulin NPH Human, [place], (NOVOLIN N [place]) Inject 50 Units into the skin 2 (two) times daily.   Yes [provider]  insulin regular (NOVOLIN R,HUMULIN R) 100 units/mL injection Inject 5-20 Units into the skin 3 (three) times daily before meals.   Yes [provider]  insulin regular (NOVOLIN R,HUMULIN R) 100 units/mL injection Inject 5-20 Units into the skin 3 (three) times daily before meals.   Yes [provider]  levothyroxine (SYNTHROID, LEVOTHROID) 150 MCG tablet Take 150 mcg by mouth daily before breakfast.   Yes [provider]  omeprazole (PRILOSEC) 40 MG capsule Take 40 mg by mouth every morning.    Yes [provider]  SODIUM BICARBONATE PO Take 10 g by mouth 2 (two) times daily.    [provider]      Allergies  Allergen Reactions  . Statins     REACTION: diarrhea  . Dilaudid [Hydromorphone Hcl] Nausea And Vomiting    After one vomiting episode no further vomiting    ROS:  Out of a complete 14 system review of symptoms, the patient complains only of the following symptoms, and all other reviewed systems are negative.  Hearing loss, ringing in the ears, dizziness Itching Blurred vision Shortness of breath, cough Easy bruising Feeling cold Memory loss, headache, dizziness, tremor Disinterest in activities Insomnia, sleepiness  Blood pressure 127/69, pulse 80, height 5\' 6"  (1.676 m), weight 226 lb (102.5 kg).   Blood pressure, right arm, sitting is 154/80. Blood pressure, right arm, standing is 146/70.  Physical Exam  General: The patient is alert and cooperative at the time of the examination. The patient is moderately to markedly obese.  Eyes: Pupils are equal, round, and reactive to light. Discs are flat bilaterally.  Neck: The neck is supple, no carotid bruits are noted.  Respiratory: The respiratory examination is clear.  Cardiovascular: The cardiovascular examination reveals a regular rate and rhythm, no obvious murmurs or rubs are noted.  Skin: Extremities are with 1+ edema at the ankles bilaterally.  Neurologic Exam  Mental status: The patient is alert and oriented x 3 at the time of the examination. The patient has apparent normal recent and remote memory, with  an apparently normal attention span and concentration ability.  Cranial nerves: Facial symmetry is present. There is good sensation of the face to pinprick and soft touch bilaterally. The strength of the facial muscles and the muscles to head turning and shoulder shrug are normal bilaterally. Speech is well enunciated, no aphasia or dysarthria is noted. Extraocular movements are full. Visual fields are full. The tongue is midline, and the patient has symmetric elevation of the soft palate. No obvious hearing deficits are noted.  Motor: The motor testing reveals 5 over 5 strength of all 4 extremities. Good symmetric motor tone is noted throughout.  Sensory: Sensory testing is intact to pinprick, soft touch, vibration sensation, and position sense on the upper extremities. With the lower extremities, there is no definite stocking pattern pinprick sensory deficit. There is some impairment of vibration sensation and position sensation to moderate degree in both feet. No evidence of extinction is  noted.  Coordination: Cerebellar testing reveals good finger-nose-finger and heel-to-shin bilaterally. No evidence of a tremor seen on the arms or legs. The patient is able to draw a spiral without tremor.  Gait and station: Gait is normal. Tandem gait is slightly unsteady. Romberg is negative. No drift is seen.  Reflexes: Deep tendon reflexes are symmetric, but are depressed bilaterally. Toes are downgoing bilaterally.   Assessment/Plan:  1. Episodic tremor  2. Diabetic peripheral neuropathy  The patient reports episodes of tremors that on occasion can be associated with a drop in blood sugar, but this is not always the case. Some episodes sound as if they may be associated with some mild anxiety. The patient indicates that she may go a month between episodes of tremor. Some of the features sound as if she may have a very low-grade essential tremor, but again the tremor is not persistent. I will give her a  small prescription for low-dose alprazolam to take if needed when the tremor occurs. The patient will follow-up in about 4 months. If the tremors are severe, she should check her blood sugar.  Jill Alexanders MD 08/06/2016 4:16 PM  [facility] [address] [place], [place] [address]  Phone [phone] Fax [phone]

## 2016-08-06 NOTE — Patient Instructions (Signed)
   May try alprazolam if needed for the tremor when it occurs.

## 2016-10-22 DIAGNOSIS — E1165 Type 2 diabetes mellitus with hyperglycemia: Secondary | ICD-10-CM | POA: Diagnosis not present

## 2016-10-22 DIAGNOSIS — E039 Hypothyroidism, unspecified: Secondary | ICD-10-CM | POA: Diagnosis not present

## 2016-10-22 DIAGNOSIS — N183 Chronic kidney disease, stage 3 (moderate): Secondary | ICD-10-CM | POA: Diagnosis not present

## 2016-11-11 DIAGNOSIS — J069 Acute upper respiratory infection, unspecified: Secondary | ICD-10-CM | POA: Diagnosis not present

## 2016-12-08 ENCOUNTER — Ambulatory Visit: Payer: Medicare Other | Admitting: Adult Health

## 2016-12-30 ENCOUNTER — Ambulatory Visit (INDEPENDENT_AMBULATORY_CARE_PROVIDER_SITE_OTHER): Payer: Medicare Other | Admitting: Ophthalmology

## 2016-12-31 DIAGNOSIS — Z0000 Encounter for general adult medical examination without abnormal findings: Secondary | ICD-10-CM | POA: Diagnosis not present

## 2016-12-31 DIAGNOSIS — E1165 Type 2 diabetes mellitus with hyperglycemia: Secondary | ICD-10-CM | POA: Diagnosis not present

## 2016-12-31 DIAGNOSIS — N183 Chronic kidney disease, stage 3 (moderate): Secondary | ICD-10-CM | POA: Diagnosis not present

## 2016-12-31 DIAGNOSIS — Z23 Encounter for immunization: Secondary | ICD-10-CM | POA: Diagnosis not present

## 2016-12-31 DIAGNOSIS — E11319 Type 2 diabetes mellitus with unspecified diabetic retinopathy without macular edema: Secondary | ICD-10-CM | POA: Diagnosis not present

## 2016-12-31 DIAGNOSIS — D649 Anemia, unspecified: Secondary | ICD-10-CM | POA: Diagnosis not present

## 2016-12-31 DIAGNOSIS — E7800 Pure hypercholesterolemia, unspecified: Secondary | ICD-10-CM | POA: Diagnosis not present

## 2016-12-31 DIAGNOSIS — E890 Postprocedural hypothyroidism: Secondary | ICD-10-CM | POA: Diagnosis not present

## 2017-01-04 DIAGNOSIS — N189 Chronic kidney disease, unspecified: Secondary | ICD-10-CM | POA: Diagnosis not present

## 2017-01-04 DIAGNOSIS — N183 Chronic kidney disease, stage 3 (moderate): Secondary | ICD-10-CM | POA: Diagnosis not present

## 2017-01-04 DIAGNOSIS — E889 Metabolic disorder, unspecified: Secondary | ICD-10-CM | POA: Diagnosis not present

## 2017-01-05 DIAGNOSIS — M9080 Osteopathy in diseases classified elsewhere, unspecified site: Secondary | ICD-10-CM | POA: Diagnosis not present

## 2017-01-05 DIAGNOSIS — E889 Metabolic disorder, unspecified: Secondary | ICD-10-CM | POA: Diagnosis not present

## 2017-01-05 DIAGNOSIS — D631 Anemia in chronic kidney disease: Secondary | ICD-10-CM | POA: Diagnosis not present

## 2017-01-05 DIAGNOSIS — I129 Hypertensive chronic kidney disease with stage 1 through stage 4 chronic kidney disease, or unspecified chronic kidney disease: Secondary | ICD-10-CM | POA: Diagnosis not present

## 2017-01-05 DIAGNOSIS — N183 Chronic kidney disease, stage 3 (moderate): Secondary | ICD-10-CM | POA: Diagnosis not present

## 2017-01-07 DIAGNOSIS — Z23 Encounter for immunization: Secondary | ICD-10-CM | POA: Diagnosis not present

## 2017-01-07 DIAGNOSIS — E7800 Pure hypercholesterolemia, unspecified: Secondary | ICD-10-CM | POA: Diagnosis not present

## 2017-01-07 DIAGNOSIS — N390 Urinary tract infection, site not specified: Secondary | ICD-10-CM | POA: Diagnosis not present

## 2017-01-07 DIAGNOSIS — E11319 Type 2 diabetes mellitus with unspecified diabetic retinopathy without macular edema: Secondary | ICD-10-CM | POA: Diagnosis not present

## 2017-01-07 DIAGNOSIS — I129 Hypertensive chronic kidney disease with stage 1 through stage 4 chronic kidney disease, or unspecified chronic kidney disease: Secondary | ICD-10-CM | POA: Diagnosis not present

## 2017-01-07 DIAGNOSIS — E1165 Type 2 diabetes mellitus with hyperglycemia: Secondary | ICD-10-CM | POA: Diagnosis not present

## 2017-01-07 DIAGNOSIS — D500 Iron deficiency anemia secondary to blood loss (chronic): Secondary | ICD-10-CM | POA: Diagnosis not present

## 2017-01-12 DIAGNOSIS — Z86010 Personal history of colonic polyps: Secondary | ICD-10-CM | POA: Diagnosis not present

## 2017-01-12 DIAGNOSIS — K219 Gastro-esophageal reflux disease without esophagitis: Secondary | ICD-10-CM | POA: Diagnosis not present

## 2017-01-12 DIAGNOSIS — Z7689 Persons encountering health services in other specified circumstances: Secondary | ICD-10-CM | POA: Diagnosis not present

## 2017-01-12 DIAGNOSIS — D509 Iron deficiency anemia, unspecified: Secondary | ICD-10-CM | POA: Diagnosis not present

## 2017-01-12 DIAGNOSIS — Z1211 Encounter for screening for malignant neoplasm of colon: Secondary | ICD-10-CM | POA: Diagnosis not present

## 2017-01-13 ENCOUNTER — Encounter (HOSPITAL_COMMUNITY): Payer: Medicare Other

## 2017-01-15 ENCOUNTER — Encounter (HOSPITAL_COMMUNITY)
Admission: RE | Admit: 2017-01-15 | Discharge: 2017-01-15 | Disposition: A | Discharge status: Home / Self Care | Payer: Medicare Other | Source: Ambulatory Visit | Attending: Nephrology | Admitting: Nephrology

## 2017-01-15 ENCOUNTER — Encounter (HOSPITAL_COMMUNITY): Payer: Self-pay

## 2017-01-15 DIAGNOSIS — D631 Anemia in chronic kidney disease: Secondary | ICD-10-CM | POA: Diagnosis not present

## 2017-01-15 DIAGNOSIS — N189 Chronic kidney disease, unspecified: Secondary | ICD-10-CM | POA: Insufficient documentation

## 2017-01-15 MED ORDER — SODIUM CHLORIDE 0.9 % IV SOLN
510.0000 mg | Freq: Once | INTRAVENOUS | Status: DC
  Filled 2017-01-15: qty 17

## 2017-01-15 MED ORDER — SODIUM CHLORIDE 0.9 % IV SOLN
Freq: Once | INTRAVENOUS | Status: AC
  Administered 2017-01-15: 11:00:00 via INTRAVENOUS

## 2017-01-18 DIAGNOSIS — K635 Polyp of colon: Secondary | ICD-10-CM | POA: Diagnosis not present

## 2017-01-18 DIAGNOSIS — D509 Iron deficiency anemia, unspecified: Secondary | ICD-10-CM | POA: Diagnosis not present

## 2017-01-18 DIAGNOSIS — D122 Benign neoplasm of ascending colon: Secondary | ICD-10-CM | POA: Diagnosis not present

## 2017-01-18 DIAGNOSIS — K317 Polyp of stomach and duodenum: Secondary | ICD-10-CM | POA: Diagnosis not present

## 2017-01-18 DIAGNOSIS — K219 Gastro-esophageal reflux disease without esophagitis: Secondary | ICD-10-CM | POA: Diagnosis not present

## 2017-01-18 DIAGNOSIS — Z1211 Encounter for screening for malignant neoplasm of colon: Secondary | ICD-10-CM | POA: Diagnosis not present

## 2017-01-18 DIAGNOSIS — K319 Disease of stomach and duodenum, unspecified: Secondary | ICD-10-CM | POA: Diagnosis not present

## 2017-01-20 ENCOUNTER — Encounter (INDEPENDENT_AMBULATORY_CARE_PROVIDER_SITE_OTHER): Payer: Medicare Other | Admitting: Ophthalmology

## 2017-01-21 ENCOUNTER — Encounter: Payer: Self-pay | Admitting: Family Medicine

## 2017-01-21 ENCOUNTER — Other Ambulatory Visit: Payer: Self-pay | Admitting: Family Medicine

## 2017-01-21 ENCOUNTER — Ambulatory Visit (INDEPENDENT_AMBULATORY_CARE_PROVIDER_SITE_OTHER): Payer: Medicare Other | Admitting: Family Medicine

## 2017-01-21 VITALS — BP 138/82 | HR 84 | Temp 97.4°F | Resp 16 | Ht 66.0 in | Wt 222.8 lb

## 2017-01-21 DIAGNOSIS — I129 Hypertensive chronic kidney disease with stage 1 through stage 4 chronic kidney disease, or unspecified chronic kidney disease: Secondary | ICD-10-CM | POA: Diagnosis not present

## 2017-01-21 DIAGNOSIS — Z1231 Encounter for screening mammogram for malignant neoplasm of breast: Secondary | ICD-10-CM

## 2017-01-21 DIAGNOSIS — R251 Tremor, unspecified: Secondary | ICD-10-CM | POA: Diagnosis not present

## 2017-01-21 DIAGNOSIS — E1165 Type 2 diabetes mellitus with hyperglycemia: Secondary | ICD-10-CM | POA: Diagnosis not present

## 2017-01-21 DIAGNOSIS — E039 Hypothyroidism, unspecified: Secondary | ICD-10-CM | POA: Diagnosis not present

## 2017-01-21 DIAGNOSIS — E1122 Type 2 diabetes mellitus with diabetic chronic kidney disease: Secondary | ICD-10-CM

## 2017-01-21 DIAGNOSIS — N184 Chronic kidney disease, stage 4 (severe): Secondary | ICD-10-CM | POA: Diagnosis not present

## 2017-01-21 DIAGNOSIS — Z1239 Encounter for other screening for malignant neoplasm of breast: Secondary | ICD-10-CM

## 2017-01-21 DIAGNOSIS — M1A0790 Idiopathic chronic gout, unspecified ankle and foot, without tophus (tophi): Secondary | ICD-10-CM | POA: Diagnosis not present

## 2017-01-21 NOTE — Progress Notes (Signed)
Patient ID: [name], female    DOB: 02-26-1951, 66 y.o.   MRN: [id number]  Chief Complaint  Patient presents with  . Hyperlipidemia  . Hypothyroidism  . Diabetes  . Hypertension    Allergies Statins and Dilaudid [hydromorphone hcl]  Subjective:   BRINKLEY PEET is a 66 y.o. female who presents to [facility] today.  HPI Ms. Rubalcava presents as a new patient to establish care today. Lives in [place] and would like to have a local primary care physician. Reports that she used to work in [place] and it was more convenient at that time for her doctor to be closer to where she worked. However at this time she would like to transfer her care. She reports that she does see several specialists who will continue to manage her care.  Is followed by: Dr. Debbora Presto, for endocrinology. Dr. Collene Mares, for gastroenterology.  She reports that she had a colonoscopy on Monday due to the iron deficiency anemia. Had one polyp and stomach had lots of polyps.  Dr. Jannifer Franklin, for tremor/ neurology. Cardiology, and has recently had a a catheterization and it was normal.  Dr. Posey Pronto, nephrology. Patient reports that Dr. Posey Pronto had her get a colonoscopy because on her blood work she was anemic and had low iron levels. Reports that she is not on aspirin because of this. Reports that her energy levels are good.  Patient reports that she is doing well and does not have any concerns today. She is also followed by ophthalmology and gets regular eye checks. Reports that she takes medication for diabetes, hypertension, and high cholesterol. Dr. Michiel Sites also follows her for her hypothyroidism.    Past Medical History:  Diagnosis Date  . Alkaline phosphatase elevation   . Anemia   . ASD (atrial septal defect)   . Cataract    both eyes hx of  . Chest pain    03-17-2013 last chest pain  . Chronic kidney disease    Stage III kidney disease  . Depression   . Diabetes mellitus type II   . Fatigue   .  GERD (gastroesophageal reflux disease)   . Gout   . Headache(784.0)    occasional  . Hyperlipidemia   . Hypothyroidism   . Neuropathy   . Peripheral neuropathy   . Retinopathy     Past Surgical History:  Procedure Laterality Date  . CATARACT EXTRACTION    . CERVICAL SPINE SURGERY  2006  . CHOLECYSTECTOMY N/A 04/07/2013   Procedure: LAPAROSCOPIC CHOLECYSTECTOMY WITH INTRAOPERATIVE CHOLANGIOGRAM;  Surgeon: Merrie Roof, MD;  Location: [facility];  Service: General;  Laterality: N/A;  . COMBINED HYSTERECTOMY VAGINAL / OOPHORECTOMY / A&P REPAIR  1987   Unilateral oophorectomy, h/o uterine prolapse has right ovary  . ERCP N/A 04/06/2013   Procedure: ENDOSCOPIC RETROGRADE CHOLANGIOPANCREATOGRAPHY (ERCP);  Surgeon: Beryle Beams, MD;  Location: [facility];  Service: Endoscopy;  Laterality: N/A;  . EUS N/A 03/31/2013   Procedure: UPPER ENDOSCOPIC ULTRASOUND (EUS) LINEAR;  Surgeon: Beryle Beams, MD;  Location: [facility];  Service: Endoscopy;  Laterality: N/A;  . REFRACTIVE SURGERY    . SPINAL FUSION    . TONSILLECTOMY  age 25    Family History  Problem Relation Age of Onset  . COPD Mother   . Lung cancer Father   . Diabetes Sister   . Cataracts Sister   . Insulin resistance Daughter   . Insulin resistance Son  Social History   Social History  . Marital status: Divorced    Spouse name: N/A  . Number of children: 3  . Years of education: 12   Occupational History  . Fre[employer]-Retired [employer]   Social History Main Topics  . Smoking status: Never Smoker  . Smokeless tobacco: Never Used  . Alcohol use No  . Drug use: No  . Sexual activity: Not Currently   Other Topics Concern  . None   Social History Narrative   Divorced   Lives alone. Reports that her son recently moved out after living with her for a long time. She reports that she is happy to be living alone and feels like she was enabling his behavior. Reports that he had a history of drug use.   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    No pets.   Eats all food groups.    Wears seat belt.    Lives in house.    Smoke detectors.        Review of Systems  Constitutional: Negative for activity change, chills, diaphoresis, fatigue and fever.  HENT: Negative for nosebleeds, sinus pressure, trouble swallowing and voice change.   Eyes: Negative for photophobia and visual disturbance.  Respiratory: Negative for cough and shortness of breath.        Reports she has never smoked.  Cardiovascular: Negative for chest pain, palpitations and leg swelling.  Endocrine: Negative for polydipsia, polyphagia and polyuria.       Patient is unable to tell me what her most recent hemoglobin A1c was approximately.  Genitourinary: Negative for dysuria, enuresis, hematuria and urgency.  Musculoskeletal: Negative for arthralgias and myalgias.  Skin: Negative for rash.  Neurological: Negative for dizziness, syncope, speech difficulty, light-headedness, numbness and headaches.       Reports she was given Xanax by Dr. Jannifer Franklin for tremors, however she never took it because she does not want to get addicted to medications. She reports that her tremors have gotten better since then.  Psychiatric/Behavioral: Negative for decreased concentration, dysphoric mood and sleep disturbance. The patient is not nervous/anxious.      Objective:   BP 138/82   Pulse 84   Temp (!) 97.4 F (36.3 C) (Other (Comment))   Resp 16   Ht 5\' 6"  (1.676 m)   Wt 222 lb 12 oz (101 kg)   SpO2 98%   BMI 35.95 kg/m   Physical Exam  Pleasant well-developed well-nourished female in no acute distress. Alert and oriented. Memory appears intact to immediate recent and remote recall. Cranial nerves II through XII grossly intact. Heart regular rate and rhythm. Lungs clear to auscultation bilaterally. Skin without lesions appreciated. Extremities, lower with 2+ pulses bilateral.  No clubbing cyanosis or edema present. Mood euthymic and affect consistent with mood. No suicidal ideations.  Assessment and Plan   1. Screening for breast cancer Referral placed and recommended.  2. Hypothyroidism, unspecified type Continue follow-up with endocrinology.  3. Type 2 diabetes mellitus with stage 4 chronic kidney disease, without long-term current use of insulin ([place]) Continue follow-up with endocrinology. Diet exercise and weight loss recommended.  4. Tremor Stable and improved. Follow-up as needed with neurology.  5. Hypertension, stable.  Patient will discuss with Dr. Posey Pronto. Suspect the reason she is not on an ace inhibitor or angiotensin receptor blocker is due to her elevated creatinine state.  6.  Hyperlipidemia with diabetes. Patient adamantly defers any statin therapy. She reports that she is unable to tolerate due to diarrhea and muscle aches. She reports that she is even tried WelChol and slow titration of statins with no success. She is not interested in trying medicines at this time despite knowing that statins decrease cardiovascular risks.   Is requested for patient to have her records sent to our office from her specialist visits when she does see them. She voiced understanding. Defers any labs at this time since she is being followed for all her conditions by specialists. Return in about 3 months (around 04/23/2017). Caren Macadam, MD 01/21/2017

## 2017-01-22 ENCOUNTER — Encounter (HOSPITAL_COMMUNITY)
Admission: RE | Admit: 2017-01-22 | Discharge: 2017-01-22 | Disposition: A | Discharge status: Home / Self Care | Payer: Medicare Other | Source: Ambulatory Visit | Attending: Nephrology | Admitting: Nephrology

## 2017-01-22 ENCOUNTER — Encounter (HOSPITAL_COMMUNITY): Payer: Self-pay

## 2017-01-22 DIAGNOSIS — D631 Anemia in chronic kidney disease: Secondary | ICD-10-CM | POA: Diagnosis not present

## 2017-01-22 DIAGNOSIS — N189 Chronic kidney disease, unspecified: Secondary | ICD-10-CM | POA: Diagnosis not present

## 2017-01-22 MED ORDER — SODIUM CHLORIDE 0.9 % IV SOLN
510.0000 mg | Freq: Once | INTRAVENOUS | Status: AC
  Administered 2017-01-22: 510 mg via INTRAVENOUS
  Filled 2017-01-22: qty 17

## 2017-01-22 MED ORDER — SODIUM CHLORIDE 0.9 % IV SOLN
INTRAVENOUS | Status: DC
  Administered 2017-01-22: 11:00:00 via INTRAVENOUS

## 2017-01-27 ENCOUNTER — Ambulatory Visit (HOSPITAL_COMMUNITY)
Admission: RE | Admit: 2017-01-27 | Discharge: 2017-01-27 | Disposition: A | Discharge status: Home / Self Care | Payer: Medicare Other | Source: Ambulatory Visit | Attending: Family Medicine | Admitting: Family Medicine

## 2017-01-27 ENCOUNTER — Encounter (HOSPITAL_COMMUNITY): Payer: Self-pay

## 2017-01-27 DIAGNOSIS — Z1231 Encounter for screening mammogram for malignant neoplasm of breast: Secondary | ICD-10-CM | POA: Diagnosis not present

## 2017-02-02 ENCOUNTER — Telehealth: Payer: Self-pay | Admitting: *Deleted

## 2017-02-02 DIAGNOSIS — D509 Iron deficiency anemia, unspecified: Secondary | ICD-10-CM | POA: Diagnosis not present

## 2017-02-02 DIAGNOSIS — Z86010 Personal history of colonic polyps: Secondary | ICD-10-CM | POA: Diagnosis not present

## 2017-02-02 NOTE — Telephone Encounter (Signed)
Patient called wanting to know if Dr Mannie Stabile received her records.

## 2017-02-03 NOTE — Telephone Encounter (Signed)
Patient called back today stating that doctor who did colonoscopy was supposed to call about getting her an appt for iron check. Are you aware?

## 2017-02-04 NOTE — Telephone Encounter (Signed)
Please call patient back and get more information. I have not received a message regarding her and her iron status. Gwen Her. Mannie Stabile, MD

## 2017-02-05 NOTE — Telephone Encounter (Signed)
Called patient regarding message below. No answer, left generic message for patient to return call.   

## 2017-02-08 NOTE — Telephone Encounter (Signed)
Called patient regarding message below. No answer, left generic message for patient to return call.   

## 2017-02-08 NOTE — Telephone Encounter (Signed)
I have been unable to reach this patient by phone.  A letter is being sent.

## 2017-02-10 NOTE — Telephone Encounter (Signed)
Patient called today regarding her medical records, I made patient aware that Bubba Hales has been trying to call her [phone].

## 2017-02-11 ENCOUNTER — Telehealth: Payer: Self-pay | Admitting: *Deleted

## 2017-02-11 NOTE — Telephone Encounter (Signed)
Left message. We have not received records from other doctors regarding iron status.

## 2017-02-11 NOTE — Telephone Encounter (Signed)
Called patient regarding message below. No answer, unable to leave message. Have not recieved

## 2017-02-11 NOTE — Telephone Encounter (Signed)
Patient left message stating she has been trying to get in touch with Dr Roma Kayser nurse. Please advise [phone]

## 2017-02-23 ENCOUNTER — Telehealth: Payer: Self-pay | Admitting: Family Medicine

## 2017-02-23 NOTE — Telephone Encounter (Signed)
Does patient needs labs prior to f/u?

## 2017-02-23 NOTE — Telephone Encounter (Signed)
She is followed by endocrinology, neurology, nephrology.  She can follow-up and we will discuss needed labs at that time.

## 2017-03-04 ENCOUNTER — Encounter (INDEPENDENT_AMBULATORY_CARE_PROVIDER_SITE_OTHER): Payer: Medicare Other | Admitting: Ophthalmology

## 2017-03-04 DIAGNOSIS — E11319 Type 2 diabetes mellitus with unspecified diabetic retinopathy without macular edema: Secondary | ICD-10-CM

## 2017-03-04 DIAGNOSIS — E113592 Type 2 diabetes mellitus with proliferative diabetic retinopathy without macular edema, left eye: Secondary | ICD-10-CM

## 2017-03-04 DIAGNOSIS — E113391 Type 2 diabetes mellitus with moderate nonproliferative diabetic retinopathy without macular edema, right eye: Secondary | ICD-10-CM

## 2017-03-04 DIAGNOSIS — H43813 Vitreous degeneration, bilateral: Secondary | ICD-10-CM | POA: Diagnosis not present

## 2017-03-19 DIAGNOSIS — M1A0790 Idiopathic chronic gout, unspecified ankle and foot, without tophus (tophi): Secondary | ICD-10-CM | POA: Insufficient documentation

## 2017-03-19 DIAGNOSIS — I129 Hypertensive chronic kidney disease with stage 1 through stage 4 chronic kidney disease, or unspecified chronic kidney disease: Secondary | ICD-10-CM | POA: Insufficient documentation

## 2017-03-25 ENCOUNTER — Other Ambulatory Visit: Payer: Self-pay | Admitting: Gastroenterology

## 2017-03-25 DIAGNOSIS — R1084 Generalized abdominal pain: Secondary | ICD-10-CM

## 2017-03-26 ENCOUNTER — Ambulatory Visit
Admission: RE | Admit: 2017-03-26 | Discharge: 2017-03-26 | Disposition: A | Discharge status: Home / Self Care | Payer: Medicare Other | Source: Ambulatory Visit | Attending: Gastroenterology | Admitting: Gastroenterology

## 2017-03-26 DIAGNOSIS — R1084 Generalized abdominal pain: Secondary | ICD-10-CM

## 2017-03-26 MED ORDER — IOPAMIDOL (ISOVUE-300) INJECTION 61%
100.0000 mL | Freq: Once | INTRAVENOUS | Status: AC | PRN
  Administered 2017-03-26: 100 mL via INTRAVENOUS

## 2017-04-01 ENCOUNTER — Encounter: Payer: Self-pay | Admitting: Family Medicine

## 2017-04-01 ENCOUNTER — Other Ambulatory Visit: Payer: Self-pay

## 2017-04-01 ENCOUNTER — Ambulatory Visit (INDEPENDENT_AMBULATORY_CARE_PROVIDER_SITE_OTHER): Payer: Medicare Other | Admitting: Family Medicine

## 2017-04-01 VITALS — BP 128/60 | HR 96 | Temp 98.4°F | Resp 18 | Ht 66.0 in | Wt 218.0 lb

## 2017-04-01 DIAGNOSIS — R0789 Other chest pain: Secondary | ICD-10-CM | POA: Diagnosis not present

## 2017-04-01 NOTE — Progress Notes (Signed)
Patient ID: [name], female    DOB: October 29, 1950, 67 y.o.   MRN: [id number]  Chief Complaint  Patient presents with  . Follow-up  . Diabetes  . Hypothyroidism  . Hyperlipidemia    Allergies Statins and Dilaudid [hydromorphone hcl]  Subjective:   [name] is a 67 y.o. female who presents to [facility] today.  HPI Mrs. Bossard presents today for follow up. She reports that she had a lot going on since she was last here. Reports that she had colonoscopy  in 12/2016. At that time had polyps and was told that colon was inflammed. Continued to have abdominal pain and so GI ordered a CT scan which was done in 02/2017. Reports that she got a call on 03/29/17 and was told by Dr. Collene Mares that she had cirrhosis of liver, enlarged spleen, and chronic pancreatitis . She went in to see Dr. Collene Mares today to discuss the study. Reports that has to have another upper EGD on 05/24/2017. She is currently on cipro and metronidazole.   She reports that she was told by Dr. Collene Mares to get a referral to see cardiology for her chest pressure.  Patient reports that her chest pressure only occurs at night when is sitting in recliner.  Reports that it occurs after eating supper.  Feeling comes on all of a sudden, last hours, no radiation.  Has not taken any medication for it. Reports that the pressure sensation goes away on its own.  Has never had chest pain or pressure with exertion.  Reports that this has been occurring approximately twice a week for the past 6 months.  It is not worsened in quality or frequency.  She reports that she only notices it after meals. Does not wake from sleep. Cardiac catheterization was done in approximately 2004 and there was no blockages, per her report. She reports that this pressure sensation occurs in the center to the right side of her chest.  Was previously seen by [facility] our cardiology but her cardiologist has retired.  She would like to see a cardiologist here in  [place].  Patient denies any current chest pressure.  She denies ever having any chest pain.  She cannot quantify the pressure on a scale of 1-10 because she said there is no pain associated with it it just feels like there is a pressure or something pressing down on her chest.  There is no associated shortness of breath, nausea, sweating, radiation, arm pain or jaw pain with this pressure sensation.    Past Medical History:  Diagnosis Date  . Alkaline phosphatase elevation   . Anemia   . ASD (atrial septal defect)   . Cataract    both eyes hx of  . Chest pain    03-17-2013 last chest pain  . Chronic kidney disease    Stage III kidney disease  . Depression   . Diabetes mellitus type II   . Fatigue   . GERD (gastroesophageal reflux disease)   . Gout   . Headache(784.0)    occasional  . Hyperlipidemia   . Hypothyroidism   . Neuropathy   . Peripheral neuropathy   . Retinopathy     Past Surgical History:  Procedure Laterality Date  . CATARACT EXTRACTION    . CERVICAL SPINE SURGERY  2006  . CHOLECYSTECTOMY N/A 04/07/2013   Procedure: LAPAROSCOPIC CHOLECYSTECTOMY WITH INTRAOPERATIVE CHOLANGIOGRAM;  Surgeon: Merrie Roof, MD;  Location: [facility];  Service: General;  Laterality:  N/A;  . COMBINED HYSTERECTOMY VAGINAL / OOPHORECTOMY / A&P REPAIR  1987   Unilateral oophorectomy, h/o uterine prolapse has right ovary  . ERCP N/A 04/06/2013   Procedure: ENDOSCOPIC RETROGRADE CHOLANGIOPANCREATOGRAPHY (ERCP);  Surgeon: Beryle Beams, MD;  Location: [facility];  Service: Endoscopy;  Laterality: N/A;  . EUS N/A 03/31/2013   Procedure: UPPER ENDOSCOPIC ULTRASOUND (EUS) LINEAR;  Surgeon: Beryle Beams, MD;  Location: [facility];  Service: Endoscopy;  Laterality: N/A;  . REFRACTIVE SURGERY    . SPINAL FUSION    . TONSILLECTOMY  age 81    Family History  Problem Relation Age of Onset  . COPD Mother   . Lung cancer Father   . Diabetes Sister   . Cataracts Sister   . Insulin resistance  Daughter   . Insulin resistance Son      Social History   Socioeconomic History  . Marital status: Divorced    Spouse name: None  . Number of children: 3  . Years of education: 37  . Highest education level: None  Social Needs  . Financial resource strain: None  . Food insecurity - worry: None  . Food insecurity - inability: None  . Transportation needs - medical: None  . Transportation needs - non-medical: None  Occupational History  . 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 Smoking status: Never Smoker  . Smokeless tobacco: Never Used  Substance and Sexual Activity  . Alcohol use: No  . Drug use: No  . Sexual activity: Not Currently  Other Topics Concern  . None  Social History Narrative   Divorced   Lives alone. Reports that her son recently moved out after living with her for a long time. She reports that she is happy to be living alone and feels like she was enabling his behavior. Reports that he had a history of drug use.   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    No pets.   Eats all food groups.    Wears seat belt.    Lives in house.    Smoke detectors.    Current Outpatient Medications on File Prior to Visit  Medication Sig Dispense Refill  . allopurinol (ZYLOPRIM) 100 MG tablet Take 100 mg by mouth every evening.     . ciprofloxacin (CIPRO) 500 MG tablet Take 500 mg by mouth 2 (two) times daily.    . hydrOXYzine (ATARAX/VISTARIL) 25 MG tablet Take 25 mg by mouth 3 (three) times daily as needed for anxiety or itching (Helps with neuropathy).     . Insulin NPH Human, [place]ophane, (NOVOLIN [place]) Inject 50 Units into the skin 2 (two) times daily.    . insulin regular (NOVOLIN R,HUMULIN R) 100 units/mL injection Inject 5-20 Units into the skin 3 (three) times daily before meals.    [name] levothyroxine (SYNTHROID, LEVOTHROID) 150 MCG tablet Take 150 mcg by mouth daily before  breakfast.    . metroNIDAZOLE (FLAGYL) 500 MG tablet Take 500 mg by mouth 3 (three) times daily.    . ranitidine (ZANTAC) 75 MG tablet Take 75 mg by mouth 2 (two) times daily.     No current facility-administered medications on file prior to visit.     Review of Systems   Objective:   BP 128/60 (BP Location: Left Arm, Patient Position: Sitting, Cuff Size: Normal)   Pulse 96  Temp 98.4 F (36.9 C) (Other (Comment))   Resp 18   Ht 5\' 6"  (1.676 m)   Wt 218 lb (98.9 kg)   SpO2 98%   BMI 35.19 kg/m   Physical Exam  Constitutional: She is oriented to person, place, and time. She appears well-developed and well-nourished. No distress.  HENT:  Head: Normocephalic and atraumatic.  Eyes: Pupils are equal, round, and reactive to light.  Neck: Normal range of motion. Neck supple. No thyromegaly present.  Cardiovascular: Normal rate, regular rhythm, normal heart sounds and normal pulses.  Pulmonary/Chest: Effort normal and breath sounds normal. No stridor. No respiratory distress. She has no decreased breath sounds.  No chest wall tenderness to palpation.  Chest pressure sensation that patient feels at night is unable to be reproduced with palpation of chest wall.  Abdominal: Soft. Bowel sounds are normal.  Obese abdomen.  Neurological: She is alert and oriented to person, place, and time. No cranial nerve deficit.  Skin: Skin is warm and dry.  Psychiatric: She has a normal mood and affect. Her behavior is normal. Judgment and thought content normal.  Nursing note and vitals reviewed.  EKG performed in office today with no acute changes.  EKG does reveal normal sinus rhythm with LVH and left axis deviation.  Will send EKG to cardiology for review.  Assessment and Plan   1. Chest pressure Patient does have significant risk factors for cardiac disease.  However, I do not believe that her chest pressure is secondary to cardiac reasons.  Her symptoms occur directly after eating and there  is no associated exertion or symptoms otherwise.  I believe her symptoms are secondary to gastrointestinal disorder/chronic pancreatitis/reflux.  However due to her risk factors she does need evaluation by cardiology.  Referral was placed today.  [facility] and patient was given appointment for January 30.  Patient was given this information and voice she would go to the appointment. -Patient was counseled today regarding signs and symptoms worrisome for a heart attack or stroke.  She was told if those occurred to call 911 and seek medical help immediately.  She voiced understanding. -Patient was not started on aspirin today due to the fact that it had been discontinued by gastroenterology for occult GI bleeding. - Ambulatory referral to Cardiology EKG was discussed with patient.  We did discuss that there were no acute changes of a possible older findings.  At this time will continue with cardiology referral and if something changes patient will call. Keep scheduled follow-up visits with nephrology, gastroenterology, and endocrinology. Return in about 3 months (around 06/30/2017) for Follow-up. Caren Macadam, MD 04/01/2017

## 2017-04-27 ENCOUNTER — Ambulatory Visit: Payer: Medicare Other | Admitting: Cardiovascular Disease

## 2017-04-27 ENCOUNTER — Other Ambulatory Visit: Payer: Self-pay

## 2017-04-27 ENCOUNTER — Encounter: Payer: Self-pay | Admitting: Cardiovascular Disease

## 2017-04-27 VITALS — BP 127/83 | HR 74 | Ht 66.0 in | Wt 210.0 lb

## 2017-04-27 DIAGNOSIS — Z794 Long term (current) use of insulin: Secondary | ICD-10-CM

## 2017-04-27 DIAGNOSIS — IMO0001 Reserved for inherently not codable concepts without codable children: Secondary | ICD-10-CM

## 2017-04-27 DIAGNOSIS — E119 Type 2 diabetes mellitus without complications: Secondary | ICD-10-CM | POA: Diagnosis not present

## 2017-04-27 DIAGNOSIS — K219 Gastro-esophageal reflux disease without esophagitis: Secondary | ICD-10-CM | POA: Diagnosis not present

## 2017-04-27 DIAGNOSIS — R0789 Other chest pain: Secondary | ICD-10-CM | POA: Diagnosis not present

## 2017-04-27 NOTE — Patient Instructions (Signed)
Your physician recommends that you schedule a follow-up appointment in: AS NEEDED WITH DR KONESWARAN  Your physician recommends that you continue on your current medications as directed. Please refer to the Current Medication list given to you today.  Thank you for choosing [facility]!!    

## 2017-04-27 NOTE — Progress Notes (Signed)
CARDIOLOGY CONSULT NOTE  Patient ID: [name] MRN: [id number] DOB/AGE: [birth year] 67 y.o.  Admit date: (Not on file) Primary Physician: Caren Macadam, MD Referring Physician: Caren Macadam, MD  Reason for Consultation: Chest pressure  HPI: [name] is a 67 y.o. female who is being seen today for the evaluation of chest pressure at the request of Caren Macadam, MD.   I reviewed notes from her PCP. Past medical history includes insulin-dependent diabetes mellitus and GERD.  She reportedly gets chest pressure which occurs at night when she is sitting in recliner.  Usually occurs after eating supper.  It spontaneously resolves.  There is no association with exertion.  It has been going on for about 6 months and occurs about twice per week.  It is retrosternally located and on the right side of her chest.  I reviewed a coronary angiogram report dated 02/20/2000 which demonstrated normal coronary arteries.  I personally reviewed an ECG performed on 04/01/17 which demonstrated sinus rhythm with LVH, repolarization abnormalities, nonspecific intraventricular conduction delay, and RSR prime pattern in precordial leads.  She reinforces what I read from her PCP's notes.  She has a chest pressure after eating.  It is located in the center of her chest and slightly to the right.  She has no exertional symptoms such as chest pain or exertional dyspnea.  She only experience is low back pain with exertion.  There is no family history of premature coronary artery disease.  She has a history of GERD and is currently on Zantac.  She had been on Prilosec in the past but said she experienced side effects due to this.  I reviewed a CT of the abdomen performed on 03/26/17 which showed cirrhosis, moderate splenomegaly, chronic pancreatitis, some colonic inflammation, and aortic atherosclerosis.  She is scheduled for an EGD on 05/24/17.   Allergies  Allergen Reactions  . Statins Other  (See Comments)    REACTION: diarrhea/joint pain/muscle aches. Tried at low doses.   . Dilaudid [Hydromorphone Hcl] Nausea And Vomiting    After one vomiting episode no further vomiting    Current Outpatient Medications  Medication Sig Dispense Refill  . allopurinol (ZYLOPRIM) 100 MG tablet Take 100 mg by mouth every evening.     . ciprofloxacin (CIPRO) 500 MG tablet Take 500 mg by mouth 2 (two) times daily.    . hydrOXYzine (ATARAX/VISTARIL) 25 MG tablet Take 25 mg by mouth 3 (three) times daily as needed for anxiety or itching (Helps with neuropathy).     . Insulin NPH Human, Isophane, (NOVOLIN N [place]) Inject 50 Units into the skin 2 (two) times daily.    . insulin regular (NOVOLIN R,HUMULIN R) 100 units/mL injection Inject 5-20 Units into the skin 3 (three) times daily before meals.    [name] levothyroxine (SYNTHROID, LEVOTHROID) 150 MCG tablet Take 150 mcg by mouth daily before breakfast.    . metroNIDAZOLE (FLAGYL) 500 MG tablet Take 500 mg by mouth 3 (three) times daily.    . ranitidine (ZANTAC) 300 MG tablet Take 300 mg by mouth at bedtime.     No current facility-administered medications for this visit.     Past Medical History:  Diagnosis Date  . Alkaline phosphatase elevation   . Anemia   . ASD (atrial septal defect)   . Cataract    both eyes hx of  . Chest pain    03-17-2013 last chest pain  . Chronic kidney disease  Stage III kidney disease  . Depression   . Diabetes mellitus type II   . Fatigue   . GERD (gastroesophageal reflux disease)   . Gout   . Headache(784.0)    occasional  . Hyperlipidemia   . Hypothyroidism   . Neuropathy   . Peripheral neuropathy   . Retinopathy     Past Surgical History:  Procedure Laterality Date  . CATARACT EXTRACTION    . CERVICAL SPINE SURGERY  2006  . CHOLECYSTECTOMY N/A 04/07/2013   Procedure: LAPAROSCOPIC CHOLECYSTECTOMY WITH INTRAOPERATIVE CHOLANGIOGRAM;  Surgeon: Merrie Roof, MD;  Location: [facility];  Service: General;   Laterality: N/A;  . COMBINED HYSTERECTOMY VAGINAL / OOPHORECTOMY / A&P REPAIR  1987   Unilateral oophorectomy, h/o uterine prolapse has right ovary  . ERCP N/A 04/06/2013   Procedure: ENDOSCOPIC RETROGRADE CHOLANGIOPANCREATOGRAPHY (ERCP);  Surgeon: Beryle Beams, MD;  Location: [facility];  Service: Endoscopy;  Laterality: N/A;  . EUS N/A 03/31/2013   Procedure: UPPER ENDOSCOPIC ULTRASOUND (EUS) LINEAR;  Surgeon: Beryle Beams, MD;  Location: [facility];  Service: Endoscopy;  Laterality: N/A;  . REFRACTIVE SURGERY    . SPINAL FUSION    . TONSILLECTOMY  age 59    Social History   Socioeconomic History  . Marital status: Divorced    Spouse name: Not on file  . Number of children: 3  . Years of education: 10  . Highest education level: Not on file  Social Needs  . Financial resource strain: Not on file  . Food insecurity - worry: Not on file  . Food insecurity - inability: Not on file  . Transportation needs - medical: Not on file  . Transportation needs - non-medical: Not on file  Occupational History  . 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 Smoking status: Never Smoker  . Smokeless tobacco: Never Used  Substance and Sexual Activity  . Alcohol use: No  . Drug use: No  . Sexual activity: Not Currently  Other Topics Concern  . Not on file  Social History Narrative   Divorced   Lives alone. Reports that her son recently moved out after living with her for a long time. She reports that she is happy to be living alone and feels like she was enabling his behavior. Reports that he had a history of drug use.   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    No pets.   Eats all food groups.    Wears seat belt.    Lives in house.    Smoke detectors.      No family history of premature CAD in 1st degree relatives.  Current Meds  Medication Sig  . allopurinol (ZYLOPRIM) 100 MG tablet  Take 100 mg by mouth every evening.   . ciprofloxacin (CIPRO) 500 MG tablet Take 500 mg by mouth 2 (two) times daily.  . hydrOXYzine (ATARAX/VISTARIL) 25 MG tablet Take 25 mg by mouth 3 (three) times daily as needed for anxiety or itching (Helps with neuropathy).   . Insulin NPH Human, Isophane, (NOVOLIN N [place]) Inject 50 Units into the skin 2 (two) times daily.  . insulin regular (NOVOLIN R,HUMULIN R) 100 units/mL injection Inject 5-20 Units into the skin 3 (three) times daily before meals.  [name] levothyroxine (SYNTHROID, LEVOTHROID) 150 MCG tablet Take 150 mcg by mouth daily before breakfast.  .  metroNIDAZOLE (FLAGYL) 500 MG tablet Take 500 mg by mouth 3 (three) times daily.  . ranitidine (ZANTAC) 300 MG tablet Take 300 mg by mouth at bedtime.      Review of systems complete and found to be negative unless listed above in HPI    Physical exam Blood pressure 127/83, pulse 74, height 5' 6"  (1.676 m), weight 210 lb (95.3 kg), SpO2 97 %. General: NAD Neck: No JVD, no thyromegaly or thyroid nodule.  Lungs: Clear to auscultation bilaterally with normal respiratory effort. CV: Nondisplaced PMI. Regular rate and rhythm, normal S1/S2, no S3/S4, no murmur.  No peripheral edema.  No carotid bruit.    Abdomen: Soft, nontender, no distention.  Skin: Intact without lesions or rashes.  Neurologic: Alert and oriented x 3.  Psych: Normal affect. Extremities: No clubbing or cyanosis.  HEENT: Normal.   ECG: Most recent ECG reviewed.   Labs:    Lipids: Lab Results  Component Value Date/Time   LDLCALC 91 02/12/2010 08:11 PM   CHOL 154 02/12/2010 08:11 PM   TRIG 161 (H) 02/12/2010 08:11 PM   HDL 31 (L) 02/12/2010 08:11 PM        ASSESSMENT AND PLAN:  1.  Chest pressure: This does not appear to be cardiac in etiology is more likely related to gastroesophageal reflux disease and perhaps some esophagitis.  She is scheduled for an EGD on 05/24/17.  She is currently on Zantac and had been on Prilosec  in the past but said she experienced side effects from this.  She may need to be switched to Protonix 40 mg.  I will defer to her PCP.  2.  Insulin-dependent diabetes mellitus: Currently on insulin.  3.  GERD: See #1.    Disposition: Follow up as needed  Signed: Kate Sable, M.D., F.A.C.C.  04/27/2017, 1:09 PM

## 2017-04-28 ENCOUNTER — Ambulatory Visit: Payer: Medicare Other | Admitting: Cardiovascular Disease

## 2017-05-23 ENCOUNTER — Emergency Department (HOSPITAL_COMMUNITY)
Admission: EM | Admit: 2017-05-23 | Discharge: 2017-05-23 | Disposition: A | Discharge status: Home / Self Care | Payer: Medicare Other | Attending: Emergency Medicine | Admitting: Emergency Medicine

## 2017-05-23 ENCOUNTER — Emergency Department (HOSPITAL_COMMUNITY): Payer: Medicare Other

## 2017-05-23 ENCOUNTER — Encounter (HOSPITAL_COMMUNITY): Payer: Self-pay | Admitting: Emergency Medicine

## 2017-05-23 ENCOUNTER — Other Ambulatory Visit: Payer: Self-pay

## 2017-05-23 DIAGNOSIS — Y999 Unspecified external cause status: Secondary | ICD-10-CM | POA: Insufficient documentation

## 2017-05-23 DIAGNOSIS — Y92009 Unspecified place in unspecified non-institutional (private) residence as the place of occurrence of the external cause: Secondary | ICD-10-CM | POA: Diagnosis not present

## 2017-05-23 DIAGNOSIS — E1122 Type 2 diabetes mellitus with diabetic chronic kidney disease: Secondary | ICD-10-CM | POA: Diagnosis not present

## 2017-05-23 DIAGNOSIS — Y9389 Activity, other specified: Secondary | ICD-10-CM | POA: Diagnosis not present

## 2017-05-23 DIAGNOSIS — S0990XA Unspecified injury of head, initial encounter: Secondary | ICD-10-CM

## 2017-05-23 DIAGNOSIS — W01190A Fall on same level from slipping, tripping and stumbling with subsequent striking against furniture, initial encounter: Secondary | ICD-10-CM | POA: Diagnosis not present

## 2017-05-23 DIAGNOSIS — I129 Hypertensive chronic kidney disease with stage 1 through stage 4 chronic kidney disease, or unspecified chronic kidney disease: Secondary | ICD-10-CM | POA: Insufficient documentation

## 2017-05-23 DIAGNOSIS — Z794 Long term (current) use of insulin: Secondary | ICD-10-CM | POA: Insufficient documentation

## 2017-05-23 DIAGNOSIS — S0003XA Contusion of scalp, initial encounter: Secondary | ICD-10-CM

## 2017-05-23 DIAGNOSIS — E039 Hypothyroidism, unspecified: Secondary | ICD-10-CM | POA: Insufficient documentation

## 2017-05-23 DIAGNOSIS — W19XXXA Unspecified fall, initial encounter: Secondary | ICD-10-CM

## 2017-05-23 DIAGNOSIS — N182 Chronic kidney disease, stage 2 (mild): Secondary | ICD-10-CM | POA: Diagnosis not present

## 2017-05-23 HISTORY — DX: Unspecified cirrhosis of liver: K74.60

## 2017-05-23 MED ORDER — TRAMADOL HCL 50 MG PO TABS: 50.0000 mg | ORAL_TABLET | Freq: Four times a day (QID) | ORAL | 0 refills | Status: DC | PRN

## 2017-05-23 NOTE — ED Triage Notes (Signed)
Pt states she was leaning over and began to fall, attempted to grab onto a cabinet and hit her R side of her head, R knee and L wrist on concrete wall. Denies LOC or blood thinner use. Pt ambulatory to triage.

## 2017-05-23 NOTE — ED Notes (Signed)
Patient denies pain and is resting comfortably.  

## 2017-05-23 NOTE — ED Provider Notes (Signed)
Medical screening examination/treatment/procedure(s) were conducted as a shared visit with non-physician practitioner(s) and myself.  I personally evaluated the patient during the encounter.  Clinical Impression:   Final diagnoses:  Fall, initial encounter  Injury of head, initial encounter  Hematoma of scalp, initial encounter      67 year old female who had a fall earlier today when she was bending forward landing on a cement wall,  She has had no LOC and on exam has a small hematoma to the R temporal area - well appearing otherwise without abnormal mental status or focal abnormal exam findings on neuro exam, awake and alert.  Results for orders placed or performed during the hospital encounter of 03/03/16  CBC with Differential  Result Value Ref Range   WBC 11.9 (H) 4.0 - 10.5 K/uL   RBC 3.68 (L) 3.87 - 5.11 MIL/uL   Hemoglobin 9.9 (L) 12.0 - 15.0 g/dL   HCT 32.8 (L) 36.0 - 46.0 %   MCV 89.1 78.0 - 100.0 fL   MCH 26.9 26.0 - 34.0 pg   MCHC 30.2 30.0 - 36.0 g/dL   RDW 15.5 11.5 - 15.5 %   Platelets 110 (L) 150 - 400 K/uL   Neutrophils Relative % 93 %   Neutro Abs 11.1 (H) 1.7 - 7.7 K/uL   Lymphocytes Relative 5 %   Lymphs Abs 0.5 (L) 0.7 - 4.0 K/uL   Monocytes Relative 2 %   Monocytes Absolute 0.2 0.1 - 1.0 K/uL   Eosinophils Relative 0 %   Eosinophils Absolute 0.0 0.0 - 0.7 K/uL   Basophils Relative 0 %   Basophils Absolute 0.0 0.0 - 0.1 K/uL  Basic metabolic panel  Result Value Ref Range   Sodium 138 135 - 145 mmol/L   Potassium 3.8 3.5 - 5.1 mmol/L   Chloride 110 101 - 111 mmol/L   CO2 19 (L) 22 - 32 mmol/L   Glucose, Bld 125 (H) 65 - 99 mg/dL   BUN 25 (H) 6 - 20 mg/dL   Creatinine, Ser 1.76 (H) 0.44 - 1.00 mg/dL   Calcium 8.7 (L) 8.9 - 10.3 mg/dL   GFR calc non Af Amer 29 (L) >60 mL/min   GFR calc Af Amer 34 (L) >60 mL/min   Anion gap 9 5 - 15  Urinalysis, Routine w reflex microscopic  Result Value Ref Range   Color, Urine YELLOW YELLOW   APPearance CLEAR  CLEAR   Specific Gravity, Urine 1.010 1.005 - 1.030   pH 5.5 5.0 - 8.0   Glucose, UA NEGATIVE NEGATIVE mg/dL   Hgb urine dipstick NEGATIVE NEGATIVE   Bilirubin Urine NEGATIVE NEGATIVE   Ketones, ur NEGATIVE NEGATIVE mg/dL   Protein, ur NEGATIVE NEGATIVE mg/dL   Nitrite NEGATIVE NEGATIVE   Leukocytes, UA NEGATIVE NEGATIVE  CBG monitoring, ED  Result Value Ref Range   Glucose-Capillary 49 (L) 65 - 99 mg/dL   Comment 1 Document in Chart   CBG monitoring, ED  Result Value Ref Range   Glucose-Capillary 160 (H) 65 - 99 mg/dL  POC CBG, ED  Result Value Ref Range   Glucose-Capillary 130 (H) 65 - 99 mg/dL   Ct Head Wo Contrast  Result Date: 05/23/2017 CLINICAL DATA:  Fall with headache EXAM: CT HEAD WITHOUT CONTRAST TECHNIQUE: Contiguous axial images were obtained from the base of the skull through the vertex without intravenous contrast. COMPARISON:  Head CT 01/19/2009 FINDINGS: Brain: No mass lesion, intraparenchymal hemorrhage or extra-axial collection. No evidence of acute cortical infarct. Brain parenchyma  and CSF-containing spaces are normal for age. Vascular: No hyperdense vessel or unexpected calcification. Skull: Small right frontal scalp hematoma.  No skull fracture. Sinuses/Orbits: No sinus fluid levels or advanced mucosal thickening. No mastoid effusion. Normal orbits. IMPRESSION: 1. Normal aging brain. 2. Small right frontal scalp hematoma without skull fracture. Electronically Signed   By: Ulyses Jarred M.D.   On: 05/23/2017 17:13       Brandy Chapel, MD 05/24/17 [phone]

## 2017-05-23 NOTE — Discharge Instructions (Signed)
Apply ice or a cool compress to your forehead to help improve the swelling for 15-20 minutes as frequently as needed.   For severe pain, you may take 1 tablet of tramadol no more than once every 6 hours.   Symptoms after a head injury you can have concussion-like symptoms, which include more difficulty remembering names or words, or feeling groggy headed.  Most of the symptoms improve several weeks after the injury.  If you continue to feel dizzy with changing positions, please follow-up with your primary care provider.  If you develop new or worsening symptoms, including vomiting, if you have another fall, developed the worst headache of your life, changes in your vision, or other new concerning symptoms, please return to the emergency department for re-evaluation.

## 2017-05-23 NOTE — ED Provider Notes (Signed)
[facility] EMERGENCY DEPARTMENT Provider Note   CSN: [id number] Arrival date & time: 05/23/17  1518     History   Chief Complaint Chief Complaint  Patient presents with  . Fall    HPI [name] is a 67 y.o. female with a h/o of DM Type II, GERD, HLD, HTN, anemia, and cirrhosis of the liver who presents to the emergency department with chief complaint of fall at ~14:15.  The patient was leaning over sweeping under a table when she leaned over too far and was unable to stop herself from falling.  She states that she attempted to grab a small cabinet near her to catch her fall.  She states that she hit her right knee on the floor and hit the right side of her head against the wall.  She states that she caught her medical alert bracelet on her left wrist against the cabinet, which caused a skin tear.  She denies LOC, nausea, or emesis.  She sat on the floor for about 25 minutes then was able to scoot herself over to the couch where she was able to stand.  She has been ambulatory since the fall.  She has had previous falls when she has bent over or changed positions in the past.  She has a chronic history of feeling dizzy when she lays on her right side that resolves after a few seconds.  In the ED, she has a small hematoma on the right frontotemporal area of her scalp.  She denies headache, dizziness, visual changes, weakness, numbness, lightheadedness, pain to the upper or lower extremities.  She applied ice and a frozen bag of vegetables to the hematoma and dressed to the skin tear with bacitracin and a Band-Aid prior to arrival in the ED.  Past medical history includes cervical spine surgery for spinal stenosis in 2006.  She does not take any blood thinners.  The history is provided by the patient. No language interpreter was used.  Fall  This is a new problem. The current episode started 1 to 2 hours ago. The problem occurs constantly. The problem has been gradually improving.  Pertinent negatives include no chest pain, no abdominal pain, no headaches and no shortness of breath. Nothing aggravates the symptoms. Nothing relieves the symptoms. She has tried nothing for the symptoms.    Past Medical History:  Diagnosis Date  . Alkaline phosphatase elevation   . Anemia   . ASD (atrial septal defect)   . Cataract    both eyes hx of  . Chest pain    03-17-2013 last chest pain  . Chronic kidney disease    Stage III kidney disease  . Cirrhosis ([place])   . Depression   . Diabetes mellitus type II   . Fatigue   . GERD (gastroesophageal reflux disease)   . Gout   . Headache(784.0)    occasional  . Hyperlipidemia   . Hypothyroidism   . Neuropathy   . Peripheral neuropathy   . Retinopathy     Patient Active Problem List   Diagnosis Date Noted  . Type 2 diabetes mellitus with hyperglycemia ([place]) 03/19/2017  . Hypertension with renal disease 03/19/2017  . 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/19/2017  . Tremor 08/06/2016  . Cholecystitis 04/05/2013  . Type 2 diabetes mellitus with diabetic chronic kidney disease ([place]) 02/05/2010  . CHEST PAIN 02/05/2010  . Hypothyroidism 03/20/2008  . HYPERLIPIDEMIA 03/20/2008  . RENAL DISEASE, CHRONIC, STAGE II 03/20/2008  .  DEPRESSION 02/21/2008  . PERIPHERAL NEUROPATHY 02/21/2008  . CATARACTS 02/21/2008  . GERD 02/21/2008    Past Surgical History:  Procedure Laterality Date  . CATARACT EXTRACTION    . CERVICAL SPINE SURGERY  2006  . CHOLECYSTECTOMY N/A 04/07/2013   Procedure: LAPAROSCOPIC CHOLECYSTECTOMY WITH INTRAOPERATIVE CHOLANGIOGRAM;  Surgeon: Merrie Roof, MD;  Location: [facility];  Service: General;  Laterality: N/A;  . COMBINED HYSTERECTOMY VAGINAL / OOPHORECTOMY / A&P REPAIR  1987   Unilateral oophorectomy, h/o uterine prolapse has right ovary  . ERCP N/A 04/06/2013   Procedure: ENDOSCOPIC RETROGRADE CHOLANGIOPANCREATOGRAPHY (ERCP);  Surgeon: Beryle Beams, MD;  Location: [facility];  Service:  Endoscopy;  Laterality: N/A;  . EUS N/A 03/31/2013   Procedure: UPPER ENDOSCOPIC ULTRASOUND (EUS) LINEAR;  Surgeon: Beryle Beams, MD;  Location: [facility];  Service: Endoscopy;  Laterality: N/A;  . REFRACTIVE SURGERY    . SPINAL FUSION    . TONSILLECTOMY  age 34    OB History    No data available       Home Medications    Prior to Admission medications   Medication Sig Start Date End Date Taking? Authorizing Provider  allopurinol (ZYLOPRIM) 100 MG tablet Take 100 mg by mouth every evening.     [provider]  ciprofloxacin (CIPRO) 500 MG tablet Take 500 mg by mouth 2 (two) times daily.    [provider]  hydrOXYzine (ATARAX/VISTARIL) 25 MG tablet Take 25 mg by mouth 3 (three) times daily as needed for anxiety or itching (Helps with neuropathy).     [provider]  Insulin NPH Human, Isophane, (NOVOLIN N [place]) Inject 50 Units into the skin 2 (two) times daily.    [provider]  insulin regular (NOVOLIN R,HUMULIN R) 100 units/mL injection Inject 5-20 Units into the skin 3 (three) times daily before meals.    [provider]  levothyroxine (SYNTHROID, LEVOTHROID) 150 MCG tablet Take 150 mcg by mouth daily before breakfast.    [provider]  metroNIDAZOLE (FLAGYL) 500 MG tablet Take 500 mg by mouth 3 (three) times daily.    [provider]  ranitidine (ZANTAC) 300 MG tablet Take 300 mg by mouth at bedtime.    [provider]  traMADol (ULTRAM) 50 MG tablet Take 1 tablet (50 mg total) by mouth every 6 (six) hours as needed. 05/23/17   Bonita Brindisi, Laymond Purser, PA-C    Family History Family History  Problem Relation Age of Onset  . COPD Mother   . Lung cancer Father   . Diabetes Sister   . Cataracts Sister   . Insulin resistance Daughter   . Insulin resistance Son     Social History Social History   Tobacco Use  . Smoking status: Never Smoker  . Smokeless tobacco: Never Used  Substance Use Topics  . Alcohol  use: No  . Drug use: No     Allergies   Statins and Dilaudid [hydromorphone hcl]   Review of Systems Review of Systems  Constitutional: Negative for activity change.  HENT: Positive for facial swelling.   Eyes: Negative for visual disturbance.  Respiratory: Negative for shortness of breath.   Cardiovascular: Negative for chest pain.  Gastrointestinal: Negative for abdominal pain.  Musculoskeletal: Negative for arthralgias, back pain, gait problem, joint swelling, myalgias, neck pain and neck stiffness.  Skin: Positive for wound. Negative for rash.  Allergic/Immunologic: Positive for immunocompromised state.  Neurological: Negative for syncope, weakness, numbness and headaches.  Physical Exam Updated Vital Signs BP (!) 148/60 (BP Location: Right Arm)   Pulse 71   Temp 98.2 F (36.8 C) (Oral)   Resp 18   Ht 5\' 6"  (1.676 m)   Wt 95.3 kg (210 lb)   SpO2 99%   BMI 33.89 kg/m   Physical Exam  Constitutional: She is oriented to person, place, and time. No distress.  HENT:  Head: Normocephalic.  2.5 cm hematoma noted to the right frontotemporal area of the scalp, along the hairline.  No underlying step-offs.  No overlying lacerations or abrasions.  Eyes: Conjunctivae and EOM are normal. Pupils are equal, round, and reactive to light.  Neck: Normal range of motion. Neck supple. No tracheal deviation present.  Full active and passive range of motion of the neck with rotation, lateral flexion, flexion, and extension without pain.  Cardiovascular: Normal rate, regular rhythm, normal heart sounds and intact distal pulses. Exam reveals no gallop and no friction rub.  No murmur heard. Pulmonary/Chest: Effort normal and breath sounds normal. No stridor. No respiratory distress. She has no wheezes. She has no rales. She exhibits no tenderness.  Abdominal: Soft. She exhibits no distension and no mass. There is no tenderness. There is no rebound and no guarding. No hernia.    Musculoskeletal: Normal range of motion. She exhibits no edema, tenderness or deformity.  Head to toe exam.  No tenderness to palpation of the spinous processes or surrounding paraspinal muscles of the cervical, thoracic, or lumbar spine.  Bilateral shoulders, elbows, wrists, hips, knees, and ankles are nontender to palpation with full active and passive range of motion.  1.5 cm area of ecchymosis to the skin overlying the right patella.  No medial or lateral joint line tenderness.  The patella tracks well.  No crepitus or step-offs.   Small skin tear noted to the dorsum of the left wrist.  No evidence of foreign body.  Hemostatic.  Neurological: She is alert and oriented to person, place, and time.  Cranial nerves 2-12 grossly intact. Finger-to-nose is normal. 5/5 motor strength of the bilateral upper and lower extremities. Moves all four extremities. Negative Romberg.  Symmetric tandem gait. NVI.    Skin: Skin is warm. No rash noted.  Psychiatric: Her behavior is normal.  Nursing note and vitals reviewed.    ED Treatments / Results  Labs (all labs ordered are listed, but only abnormal results are displayed) Labs Reviewed - No data to display  EKG  EKG Interpretation None       Radiology Ct Head Wo Contrast  Result Date: 05/23/2017 CLINICAL DATA:  Fall with headache EXAM: CT HEAD WITHOUT CONTRAST TECHNIQUE: Contiguous axial images were obtained from the base of the skull through the vertex without intravenous contrast. COMPARISON:  Head CT 01/19/2009 FINDINGS: Brain: No mass lesion, intraparenchymal hemorrhage or extra-axial collection. No evidence of acute cortical infarct. Brain parenchyma and CSF-containing spaces are normal for age. Vascular: No hyperdense vessel or unexpected calcification. Skull: Small right frontal scalp hematoma.  No skull fracture. Sinuses/Orbits: No sinus fluid levels or advanced mucosal thickening. No mastoid effusion. Normal orbits. IMPRESSION: 1. Normal  aging brain. 2. Small right frontal scalp hematoma without skull fracture. Electronically Signed   By: Ulyses Jarred M.D.   On: 05/23/2017 17:13    Procedures Procedures (including critical care time)  Medications Ordered in ED Medications - No data to display   Initial Impression / Assessment and Plan / ED Course  I have reviewed the triage vital signs  and the nursing notes.  Pertinent labs & imaging results that were available during my care of the patient were reviewed by me and considered in my medical decision making (see chart for details).     67 year old female with a h/o of DM Type II, GERD, HLD, HTN, CKD, anemia, and cirrhosis of the liver presenting after a mechanical fall to the floor from a bent over position.  She hit her head against the wall.  She has a small skin tear to the left wrist and a small area of ecchymosis to the right knee.  Exam is otherwise unremarkable.  CT head is unremarkable.  She is hemodynamically stable and in no acute distress.  Declines pain medication at this time.  The patient was seen and evaluated by Dr. Sabra Heck, attending physician.  Given the patient's history of CKD and cirrhosis will avoid Tylenol and NSAIDs at this time.  The patient successfully tolerated tramadol in the past.  Will send the patient home with a short course of tramadol for severe pain and ice for mild to moderate pain. A 65-month prescription history query was performed using the [place] CSRS prior to discharge.  Strict return precautions given.  The patient is in no acute distress and is ambulatory without difficulty.  The patient is safe for discharge to home at this time.   Final Clinical Impressions(s) / ED Diagnoses   Final diagnoses:  Fall, initial encounter  Injury of head, initial encounter  Hematoma of scalp, initial encounter    ED Discharge Orders        Ordered    traMADol (ULTRAM) 50 MG tablet  Every 6 hours PRN     05/23/17 1754       Elliott Quade A,  PA-C 05/23/17 1757    Noemi Chapel, MD 05/24/17 [phone]

## 2017-05-23 NOTE — ED Notes (Signed)
Pt ambulated to CT without difficultly

## 2017-06-09 ENCOUNTER — Telehealth: Payer: Self-pay | Admitting: Family Medicine

## 2017-06-09 DIAGNOSIS — R161 Splenomegaly, not elsewhere classified: Secondary | ICD-10-CM

## 2017-06-09 NOTE — Telephone Encounter (Signed)
Is there anything you can recommend?

## 2017-06-09 NOTE — Telephone Encounter (Signed)
Patient is having a rosacea flare-up on her face, she is very uncomfortable, itchy, burning, hurts. We have no open available appts anytime soon. I tried to call Dr.Hall's office to see if I could get her in with a dermatologist but they dont have anything until Monday.  Is there something we can do ?

## 2017-06-10 ENCOUNTER — Encounter: Payer: Self-pay | Admitting: Family Medicine

## 2017-06-10 ENCOUNTER — Other Ambulatory Visit: Payer: Self-pay

## 2017-06-10 ENCOUNTER — Ambulatory Visit (INDEPENDENT_AMBULATORY_CARE_PROVIDER_SITE_OTHER): Payer: Medicare Other | Admitting: Family Medicine

## 2017-06-10 VITALS — BP 130/60 | HR 73 | Temp 98.0°F | Resp 16 | Ht 66.0 in | Wt 212.2 lb

## 2017-06-10 DIAGNOSIS — L719 Rosacea, unspecified: Secondary | ICD-10-CM | POA: Diagnosis not present

## 2017-06-10 MED ORDER — METRONIDAZOLE 1 % EX GEL: Freq: Every day | CUTANEOUS | 0 refills | Status: DC

## 2017-06-10 MED ORDER — DOXYCYCLINE HYCLATE 100 MG PO TABS: 100.0000 mg | ORAL_TABLET | Freq: Two times a day (BID) | ORAL | 0 refills | Status: DC

## 2017-06-10 NOTE — Patient Instructions (Signed)
Rosacea Rosacea is a long-term (chronic) condition that affects the skin of the face, including the cheeks, nose, brow, and chin. This condition can also affect the eyes. Rosacea causes blood vessels near the surface of the skin to get bigger (be enlarged), and that makes the skin red. There is no cure for this condition, but treatment can help to control your symptoms. Follow these instructions at home: Skin Care Take care of your skin as told by your doctor. Your doctor may tell you do these things:  Wash your skin gently two or more times each day.  Use mild soap.  Use a sunscreen or sunblock with SPF 30 or greater.  Use gentle cosmetics that are meant for sensitive skin.  Shave with an electric shaver instead of a blade.  Lifestyle  Try to keep track of what foods trigger this condition. Avoid any triggers. These may include: ? Spicy foods. ? Seafood. ? Cheese. ? Hot liquids. ? Nuts. ? Chocolate. ? Iodized salt.  Do not drink alcohol.  Avoid extremely cold or hot temperatures.  Try to reduce your stress. If you need help to do this, talk with your doctor.  When you exercise, do these things to stay cool: ? Limit your sun exposure. ? Use a fan. ? Exercise for a shorter time, and exercise more often. General instructions  Keep all follow-up visits as told by your doctor. This is important.  Take over-the-counter and prescription medicines only as told by your doctor.  If your eyelids are affected, press warm compresses to them. Do this as told by your doctor.  If you were prescribed an antibiotic medicine, apply or take it as told by your doctor. Do not stop using the antibiotic even if your condition improves. Contact a doctor if:  Your symptoms get worse.  Your symptoms do not improve after two months of treatment.  You have new symptoms.  You have any changes in vision or you have problems with your eyes, such as redness or itching.  You feel very sad  (depressed).  You lose your appetite.  You have trouble concentrating. This information is not intended to replace advice given to you by your health care provider. Make sure you discuss any questions you have with your health care provider. Document Released: 06/08/2011 Document Revised: 08/22/2015 Document Reviewed: 05/23/2014 Elsevier Interactive Patient Education  2018 Elsevier Inc.  

## 2017-06-10 NOTE — Progress Notes (Signed)
Patient ID: [name], female    DOB: 07-03-1950, 67 y.o.   MRN: [id number]  Chief Complaint  Patient presents with  . Rosacea    flare    Allergies Statins and Dilaudid [hydromorphone hcl]  Subjective:   [name] is a 67 y.o. female who presents to [facility] today.  HPI [name] presents today for an acute visit secondary to a flareup of her rosacea.  10 days ago got a flare of rosacea after using a cream rinse on her hair and eating spicy peppers.  Reports that she developed redness of her face and itching.  She reports that she has struggled with rosacea for years and has had several bouts or flares in the past.  She reports that she had been good for a while and not been on any of the MetroGel or doxycycline.  She reports that she is previously had to use doxycycline and used the Metro gel for long-term prevention.  She denies any swelling in her mouth.  She denies any difficulty breathing.  She has no scalp issues.  She reports her heart is beating normal.  She denies any palpitations, chest pain, or shortness of breath.  She does not feel her symptoms are getting worse but she does not feel they are getting better.    Past Medical History:  Diagnosis Date  . Alkaline phosphatase elevation   . Anemia   . ASD (atrial septal defect)   . Cataract    both eyes hx of  . Chest pain    03-17-2013 last chest pain  . Chronic kidney disease    Stage III kidney disease  . Cirrhosis ([place])   . Depression   . Diabetes mellitus type II   . Fatigue   . GERD (gastroesophageal reflux disease)   . Gout   . Headache(784.0)    occasional  . Hyperlipidemia   . Hypothyroidism   . Neuropathy   . Peripheral neuropathy   . Retinopathy     Past Surgical History:  Procedure Laterality Date  . CATARACT EXTRACTION    . CERVICAL SPINE SURGERY  2006  . CHOLECYSTECTOMY N/A 04/07/2013   Procedure: LAPAROSCOPIC CHOLECYSTECTOMY WITH INTRAOPERATIVE CHOLANGIOGRAM;   Surgeon: Merrie Roof, MD;  Location: [facility];  Service: General;  Laterality: N/A;  . COMBINED HYSTERECTOMY VAGINAL / OOPHORECTOMY / A&P REPAIR  1987   Unilateral oophorectomy, h/o uterine prolapse has right ovary  . ERCP N/A 04/06/2013   Procedure: ENDOSCOPIC RETROGRADE CHOLANGIOPANCREATOGRAPHY (ERCP);  Surgeon: Beryle Beams, MD;  Location: [facility];  Service: Endoscopy;  Laterality: N/A;  . EUS N/A 03/31/2013   Procedure: UPPER ENDOSCOPIC ULTRASOUND (EUS) LINEAR;  Surgeon: Beryle Beams, MD;  Location: [facility];  Service: Endoscopy;  Laterality: N/A;  . REFRACTIVE SURGERY    . SPINAL FUSION    . TONSILLECTOMY  age 38    Family History  Problem Relation Age of Onset  . COPD Mother   . Lung cancer Father   . Diabetes Sister   . Cataracts Sister   . Insulin resistance Daughter   . Insulin resistance Son      Social History   Socioeconomic History  . Marital status: Divorced    Spouse name: None  . Number of children: 3  . Years of education: 85  . Highest education level: None  Social Needs  . Financial resource strain: None  . Food insecurity - worry: None  . Food  insecurity - inability: None  . Transportation needs - medical: None  . Transportation needs - non-medical: None  Occupational History  . 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 Smoking status: Never Smoker  . Smokeless tobacco: Never Used  Substance and Sexual Activity  . Alcohol use: No  . Drug use: No  . Sexual activity: Not Currently  Other Topics Concern  . None  Social History Narrative   Divorced   Lives alone. Reports that her son recently moved out after living with her for a long time. She reports that she is happy to be living alone and feels like she was enabling his behavior. Reports that he had a history of drug use.   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    No pets.   Eats all food  groups.    Wears seat belt.    Lives in house.    Smoke detectors.    Current Outpatient Medications on File Prior to Visit  Medication Sig Dispense Refill  . allopurinol (ZYLOPRIM) 100 MG tablet Take 100 mg by mouth every evening.     . ciprofloxacin (CIPRO) 500 MG tablet Take 500 mg by mouth 2 (two) times daily.    . hydrOXYzine (ATARAX/VISTARIL) 25 MG tablet Take 25 mg by mouth 3 (three) times daily as needed for anxiety or itching (Helps with neuropathy).     . Insulin NPH Human, Isophane, (NOVOLIN N [place]) Inject 50 Units into the skin 2 (two) times daily.    . insulin regular (NOVOLIN R,HUMULIN R) 100 units/mL injection Inject 5-20 Units into the skin 3 (three) times daily before meals.    [name] levothyroxine (SYNTHROID, LEVOTHROID) 150 MCG tablet Take 150 mcg by mouth daily before breakfast.    . ranitidine (ZANTAC) 300 MG tablet Take 300 mg by mouth at bedtime.    . traMADol (ULTRAM) 50 MG tablet Take 1 tablet (50 mg total) by mouth every 6 (six) hours as needed. 10 tablet 0   No current facility-administered medications on file prior to visit.     Review of Systems  Constitutional: Negative for activity change, appetite change and fever.  Eyes: Negative for visual disturbance.  Respiratory: Negative for cough, chest tightness and shortness of breath.   Cardiovascular: Negative for chest pain, palpitations and leg swelling.  Gastrointestinal: Negative for abdominal pain, nausea and vomiting.  Genitourinary: Negative for dysuria, frequency and urgency.  Skin: Positive for rash.  Neurological: Negative for dizziness, syncope and light-headedness.  Hematological: Negative for adenopathy.  Psychiatric/Behavioral: The patient is not nervous/anxious.      Objective:   BP 130/60 (BP Location: Left Arm, Patient Position: Sitting, Cuff Size: Normal)   Pulse 73   Temp 98 F (36.7 C) (Temporal)   Resp 16   Ht 5\' 6"  (1.676 m)   Wt 212 lb 4 oz (96.3 kg)   SpO2 97%   BMI 34.26 kg/m    Physical Exam  Constitutional: She appears well-developed and well-nourished. No distress.  Cardiovascular: Normal rate and regular rhythm.  Pulmonary/Chest: Effort normal and breath sounds normal.  Skin: Skin is warm and dry.  Confluent erythematous papules on forehead cheeks and nose.  No comedones present.  Telangiectasias present around nose.  Malar distribution including nose.  Vitals reviewed.    Assessment and Plan  1. Rosacea Flare of rosacea.  Patient was  doxycycline 1 tablet twice a day for the next week and then taper to once a day for the next month.  She will start the MetroGel at night as directed. Today we discussed use of the cream or lotion for sensitive skin, such as Cetaphil or Eucerin.  We discussed use of very mild soap, such as Cetaphil.  We also discussed triggers for rosacea such as alcohol, hot beverages, spicy foods and emotional stress.  She was counseled concerning worrisome signs and symptoms of skin infection or issues and if those occur to please call or return to clinic.  We did discuss possible side effects with doxycycline including GI irritation.  She was counseled on the administration of doxycycline.  She will call our office in the next several days and let us know if her symptoms are improving. - doxycycline (VIBRA-TABS) 100 MG tablet; Take 1 tablet (100 mg total) by mouth 2 (two) times daily.  Dispense: 60 tablet; Refill: 0 - metroNIDAZOLE (METROGEL) 1 % gel; Apply topically daily.  Dispense: 45 g; Refill: 0  Return if symptoms worsen or fail to improve. Caren Macadam, MD 06/10/2017

## 2017-06-14 ENCOUNTER — Encounter: Payer: Self-pay | Admitting: Family Medicine

## 2017-06-30 ENCOUNTER — Ambulatory Visit (INDEPENDENT_AMBULATORY_CARE_PROVIDER_SITE_OTHER): Payer: Medicare Other | Admitting: Family Medicine

## 2017-06-30 ENCOUNTER — Other Ambulatory Visit: Payer: Self-pay

## 2017-06-30 ENCOUNTER — Encounter: Payer: Self-pay | Admitting: Family Medicine

## 2017-06-30 VITALS — BP 130/70 | HR 93 | Temp 97.8°F | Resp 16 | Ht 66.0 in | Wt 208.5 lb

## 2017-06-30 DIAGNOSIS — E1122 Type 2 diabetes mellitus with diabetic chronic kidney disease: Secondary | ICD-10-CM

## 2017-06-30 DIAGNOSIS — E118 Type 2 diabetes mellitus with unspecified complications: Secondary | ICD-10-CM | POA: Diagnosis not present

## 2017-06-30 DIAGNOSIS — L719 Rosacea, unspecified: Secondary | ICD-10-CM | POA: Diagnosis not present

## 2017-06-30 DIAGNOSIS — Z789 Other specified health status: Secondary | ICD-10-CM | POA: Diagnosis not present

## 2017-06-30 DIAGNOSIS — B373 Candidiasis of vulva and vagina: Secondary | ICD-10-CM | POA: Diagnosis not present

## 2017-06-30 DIAGNOSIS — M810 Age-related osteoporosis without current pathological fracture: Secondary | ICD-10-CM

## 2017-06-30 DIAGNOSIS — Z1159 Encounter for screening for other viral diseases: Secondary | ICD-10-CM | POA: Diagnosis not present

## 2017-06-30 DIAGNOSIS — D696 Thrombocytopenia, unspecified: Secondary | ICD-10-CM | POA: Diagnosis not present

## 2017-06-30 DIAGNOSIS — Z114 Encounter for screening for human immunodeficiency virus [HIV]: Secondary | ICD-10-CM | POA: Diagnosis not present

## 2017-06-30 DIAGNOSIS — N183 Chronic kidney disease, stage 3 unspecified: Secondary | ICD-10-CM | POA: Insufficient documentation

## 2017-06-30 DIAGNOSIS — K7581 Nonalcoholic steatohepatitis (NASH): Secondary | ICD-10-CM

## 2017-06-30 DIAGNOSIS — B3731 Acute candidiasis of vulva and vagina: Secondary | ICD-10-CM

## 2017-06-30 DIAGNOSIS — K7460 Unspecified cirrhosis of liver: Secondary | ICD-10-CM | POA: Insufficient documentation

## 2017-06-30 MED ORDER — FLUCONAZOLE 150 MG PO TABS: 150.0000 mg | ORAL_TABLET | ORAL | 0 refills | Status: DC

## 2017-06-30 MED ORDER — TERCONAZOLE 0.4 % VA CREA: 1.0000 | TOPICAL_CREAM | Freq: Every day | VAGINAL | 0 refills | Status: DC

## 2017-06-30 NOTE — Patient Instructions (Signed)
  Schedule Bone density

## 2017-06-30 NOTE — Progress Notes (Signed)
Patient ID: [name], female    DOB: Jun 24, 1950, 67 y.o.   MRN: [id number]  Chief Complaint  Patient presents with  . Follow-up    rosacea cleared up    Allergies Statins and Dilaudid [hydromorphone hcl]  Subjective:   [name] is a 67 y.o. female who presents to [facility] today.  HPI [name] presents for follow-up for her rosacea.  She has been using the doxycycline pills once a day and the MetroGel cream once a day.  She reports improvement in her symptoms.  Her face is not as red or irritated by any means.  She reports that she feels much better and is able to go out in public without being embarrassed.  She is still taking the doxycycline because of issues with her teeth and has 9 more days of pills left.  She is happy with the improvement in her skin.  She is followed by Dr. Hoyt Koch for history of NASH she has been working on her diet and losing weight.  She has lost over 30 pounds.. She reports that she has cirrhosis secondary to fatty liver.  She reports she was told she also has splenomegaly.  She also has a history of low platelets as evidenced by recent CBC but she does not know why.  She has never been worked up.  Never seen hematologist.  Has no issues related to bleeding.  Is unable to take a daily aspirin secondary to her reflux and dyspepsia.  Is followed by Dr. Chalmers Cater in endocrinology for her diabetes.  Her last hemoglobin A1c was less than 6.7%.  Is not on an ACE inhibitor secondary to chronic renal insufficiency stage III.  Now followed Dr. Posey Pronto of nephrology.  ACE inhibitor was stopped after renal function worsen.  Blood pressure is always been controlled.  Dr.Balan also follows her thyroid function.  She is taking her Synthroid each day.  Denies hypoglycemia. Uses her allopurinol but has not had a flareup of gout in quite some time.  Patient reports that she does have vaginal yeast infection at this time from taking the antibiotics.  Reports  vaginal itching.  Has not more days of antibiotics left.  Reports this does happen when she has to take antibiotics on a longer term basis.   Past Medical History:  Diagnosis Date  . Alkaline phosphatase elevation   . Anemia   . ASD (atrial septal defect)   . Cataract    both eyes hx of  . Chest pain    03-17-2013 last chest pain  . Chronic kidney disease    Stage III kidney disease  . Cirrhosis ([place])   . Depression   . Diabetes mellitus type II   . Fatigue   . GERD (gastroesophageal reflux disease)   . Gout   . Headache(784.0)    occasional  . Hyperlipidemia   . Hypothyroidism   . Neuropathy   . Peripheral neuropathy   . Retinopathy     Past Surgical History:  Procedure Laterality Date  . CATARACT EXTRACTION    . CERVICAL SPINE SURGERY  2006  . CHOLECYSTECTOMY N/A 04/07/2013   Procedure: LAPAROSCOPIC CHOLECYSTECTOMY WITH INTRAOPERATIVE CHOLANGIOGRAM;  Surgeon: Merrie Roof, MD;  Location: [facility];  Service: General;  Laterality: N/A;  . COMBINED HYSTERECTOMY VAGINAL / OOPHORECTOMY / A&P REPAIR  1987   Unilateral oophorectomy, h/o uterine prolapse has right ovary  . ERCP N/A 04/06/2013   Procedure: ENDOSCOPIC RETROGRADE CHOLANGIOPANCREATOGRAPHY (ERCP);  Surgeon: Beryle Beams, MD;  Location: [facility];  Service: Endoscopy;  Laterality: N/A;  . EUS N/A 03/31/2013   Procedure: UPPER ENDOSCOPIC ULTRASOUND (EUS) LINEAR;  Surgeon: Beryle Beams, MD;  Location: [facility];  Service: Endoscopy;  Laterality: N/A;  . REFRACTIVE SURGERY    . SPINAL FUSION    . TONSILLECTOMY  age 36    Family History  Problem Relation Age of Onset  . COPD Mother   . Lung cancer Father   . Diabetes Sister   . Cataracts Sister   . Insulin resistance Daughter   . Insulin resistance Son      Social History   Socioeconomic History  . Marital status: Divorced    Spouse name: Not on file  . Number of children: 3  . Years of education: 4  . Highest education level: Not on file    Occupational History  . 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 Financial resource strain: Not on file  . Food insecurity:    Worry: Not on file    Inability: Not on file  . Transportation needs:    Medical: Not on file    Non-medical: Not on file  Tobacco Use  . Smoking status: Never Smoker  . Smokeless tobacco: Never Used  Substance and Sexual Activity  . Alcohol use: No  . Drug use: No  . Sexual activity: Not Currently  Lifestyle  . Physical activity:    Days per week: Not on file    Minutes per session: Not on file  . Stress: Not on file  Relationships  . Social connections:    Talks on phone: Not on file    Gets together: Not on file    Attends religious service: Not on file    Active member of club or organization: Not on file    Attends meetings of clubs or organizations: Not on file    Relationship status: Not on file  Other Topics Concern  . Not on file  Social History Narrative   Divorced   Lives alone. Reports that her son recently moved out after living with her for a long time. She reports that she is happy to be living alone and feels like she was enabling his behavior. Reports that he had a history of drug use.   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    No pets.   Eats all food groups.    Wears seat belt.    Lives in house.    Smoke detectors.    Current Outpatient Medications on File Prior to Visit  Medication Sig Dispense Refill  . allopurinol (ZYLOPRIM) 100 MG tablet Take 100 mg by mouth every evening.     . ciprofloxacin (CIPRO) 500 MG tablet Take 500 mg by mouth 2 (two) times daily.    [name] doxycycline (VIBRA-TABS) 100 MG tablet Take 1 tablet (100 mg total) by mouth 2 (two) times daily. 60 tablet 0  . hydrOXYzine (ATARAX/VISTARIL) 25 MG tablet Take 25 mg by mouth 3 (three) times daily as needed for anxiety or itching (Helps with neuropathy).     .  Insulin NPH Human, Isophane, (NOVOLIN N [place]) Inject 50 Units into the skin 2 (two) times daily.    . insulin regular (NOVOLIN R,HUMULIN R) 100 units/mL injection Inject 5-20 Units into the skin 3 (three) times daily  before meals.    [name] levothyroxine (SYNTHROID, LEVOTHROID) 150 MCG tablet Take 150 mcg by mouth daily before breakfast.    . metroNIDAZOLE (METROGEL) 1 % gel Apply topically daily. 45 g 0  . ranitidine (ZANTAC) 300 MG tablet Take 300 mg by mouth at bedtime.    . traMADol (ULTRAM) 50 MG tablet Take 1 tablet (50 mg total) by mouth every 6 (six) hours as needed. (Patient not taking: Reported on 06/30/2017) 10 tablet 0   No current facility-administered medications on file prior to visit.     Review of Systems  Constitutional: Negative for activity change, appetite change and fever.  HENT: Negative for nosebleeds and trouble swallowing.   Eyes: Negative for visual disturbance.  Respiratory: Negative for cough, chest tightness and shortness of breath.   Cardiovascular: Negative for chest pain, palpitations and leg swelling.  Gastrointestinal: Negative for abdominal pain, nausea and vomiting.  Genitourinary: Negative for dysuria, frequency, hematuria, urgency, vaginal bleeding and vaginal pain.       Vaginal itching  Neurological: Negative for dizziness, syncope and light-headedness.  Hematological: Negative for adenopathy.  Psychiatric/Behavioral: Negative for dysphoric mood. The patient is not nervous/anxious.      Objective:   BP 130/70 (BP Location: Left Arm, Patient Position: Sitting, Cuff Size: Normal)   Pulse 93   Temp 97.8 F (36.6 C) (Temporal)   Resp 16   Ht 5\' 6"  (1.676 m)   Wt 208 lb 8 oz (94.6 kg)   SpO2 98%   BMI 33.65 kg/m   Physical Exam  Constitutional: She is oriented to person, place, and time. She appears well-developed and well-nourished. No distress.  HENT:  Head: Normocephalic and atraumatic.  Eyes: Pupils are equal, round, and reactive to light. EOM  are normal. No scleral icterus.  Neck: Normal range of motion. Neck supple. No JVD present. No thyromegaly present.  Cardiovascular: Normal rate, regular rhythm and normal heart sounds.  Pulses:      Dorsalis pedis pulses are 2+ on the right side, and 2+ on the left side.  Pulmonary/Chest: Effort normal and breath sounds normal. No respiratory distress.  Musculoskeletal: Normal range of motion.       Right foot: There is no deformity.       Left foot: There is normal range of motion and no deformity.  Feet:  Right Foot:  Protective Sensation: 8 sites tested. 4 sites sensed.  Left Foot:  Protective Sensation: 8 sites tested. 6 sites sensed.  Neurological: She is alert and oriented to person, place, and time. No cranial nerve deficit.  Skin: Skin is warm and dry. Capillary refill takes less than 2 seconds. No erythema.  Psychiatric: She has a normal mood and affect. Her behavior is normal. Judgment and thought content normal.  Nursing note and vitals reviewed.    Assessment and Plan  1. CKD stage 3 due to type 2 diabetes mellitus ([place]) Keep scheduled follow up with Dr. Posey Pronto.  Reports that Dr. Posey Pronto will get lab work.  He is also following her anemia so she does not wish to have that blood work repeated today.  Anemia most likely secondary to chronic renal insufficiency.  Has had recent GI evaluation and was negative for bleeding.  2. Controlled diabetes mellitus type 2 with complications, unspecified whether long term insulin use ([facility]) Continue current medications and insulin.  Follow-up with endocrinology. - [facility] Diabetes Foot Exam performed today. -Aspirin deferred secondary to GI risks. DM peripheral neuropathy discussed today.  Foot care discussed.  Patient encouraged to always wear shoes and protect her feet and check daily for sores. 3. Screening for HIV (human immunodeficiency virus) -Screening performed - HIV antibody (with reflex)  4. Encounter for hepatitis C screening test  for low risk patient Screening performed- - Hepatitis C antibody  5. Osteoporosis, post-menopausal -Patient with bone density done several years ago with history of osteopenia versus osteoporosis.  She is at risk for bone loss.  Will check vitamin D levels and obtain bone density at this time. - DG Bone Density; Future - VITAMIN D 25 Hydroxy (Vit-D Deficiency, Fractures)  6. Candidal vaginitis Patient with vaginal yeast infection secondary to antibiotic use.  Was given prescription for Terazol and Diflucan.  She will use the Terazol now and then after completion of antibiotics if she needs treatment due to persistent symptoms will use Diflucan as directed.Patient counseled in detail regarding the risks of medication. Told to call or return to clinic if develop any worrisome signs or symptoms. Patient voiced understanding.   - terconazole (TERAZOL 7) 0.4 % vaginal cream; Place 1 applicator vaginally at bedtime.  Dispense: 45 g; Refill: 0 - fluconazole (DIFLUCAN) 150 MG tablet; Take 1 tablet (150 mg total) by mouth every 3 (three) days.  Dispense: 3 tablet; Refill: 0  7. Thrombocytopenia ([place]) Discussed with patient regarding diagnosis of thrombocytopenia.  Suspect secondary to splenomegaly secondary to cirrhosis.  However, will refer to hematology for evaluation at this time. - Ambulatory referral to Hematology  8. Acne rosacea Planning on stopping doxycycline 9 days.  Continue Metro cream as directed.  Request notes from Dr. Patel/nephrology and Dr. Hoyt Koch Return in about 3 months (around 09/29/2017). Caren Macadam, MD 06/30/2017

## 2017-07-02 ENCOUNTER — Telehealth: Payer: Self-pay | Admitting: Family Medicine

## 2017-07-02 NOTE — Telephone Encounter (Signed)
Patient will go this pm and have blood work done

## 2017-07-04 LAB — HEPATITIS C ANTIBODY
Hepatitis C Ab: NONREACTIVE
SIGNAL TO CUT-OFF: 0.03

## 2017-07-04 LAB — VITAMIN D 25 HYDROXY (VIT D DEFICIENCY, FRACTURES): Vit D, 25-Hydroxy: 28 ng/mL — ABNORMAL LOW (ref 30–100)

## 2017-07-04 LAB — HIV ANTIBODY (ROUTINE TESTING W REFLEX): HIV 1&2 Ab, 4th Generation: NONREACTIVE

## 2017-07-05 ENCOUNTER — Telehealth: Payer: Self-pay | Admitting: Family Medicine

## 2017-07-05 NOTE — Telephone Encounter (Signed)
[facility] called and said they need you to order a CBC for this patient.

## 2017-07-05 NOTE — Telephone Encounter (Signed)
Ordered. She can go get it at lab. Gwen Her. Mannie Stabile, MD

## 2017-07-05 NOTE — Telephone Encounter (Signed)
Patient informed of message below, verbalized understanding.  

## 2017-07-05 NOTE — Telephone Encounter (Signed)
Called patient regarding message below. No answer, left generic message for patient to return call.   

## 2017-07-06 LAB — CBC WITH DIFFERENTIAL/PLATELET
BASOS ABS: 37 {cells}/uL (ref 0–200)
Basophils Relative: 0.7 %
Eosinophils Absolute: 207 cells/uL (ref 15–500)
Eosinophils Relative: 3.9 %
HCT: 33.7 % — ABNORMAL LOW (ref 35.0–45.0)
Hemoglobin: 11.7 g/dL (ref 11.7–15.5)
Lymphs Abs: 1966 cells/uL (ref 850–3900)
MCH: 32.1 pg (ref 27.0–33.0)
MCHC: 34.7 g/dL (ref 32.0–36.0)
MCV: 92.3 fL (ref 80.0–100.0)
MONOS PCT: 6.6 %
MPV: 13.3 fL — ABNORMAL HIGH (ref 7.5–12.5)
Neutro Abs: 2740 cells/uL (ref 1500–7800)
Neutrophils Relative %: 51.7 %
PLATELETS: 66 10*3/uL — AB (ref 140–400)
RBC: 3.65 10*6/uL — ABNORMAL LOW (ref 3.80–5.10)
RDW: 13.2 % (ref 11.0–15.0)
TOTAL LYMPHOCYTE: 37.1 %
WBC mixed population: 350 cells/uL (ref 200–950)
WBC: 5.3 10*3/uL (ref 3.8–10.8)

## 2017-07-07 ENCOUNTER — Encounter: Payer: Self-pay | Admitting: Family Medicine

## 2017-07-08 ENCOUNTER — Ambulatory Visit (HOSPITAL_COMMUNITY)
Admission: RE | Admit: 2017-07-08 | Discharge: 2017-07-08 | Disposition: A | Discharge status: Home / Self Care | Payer: Medicare Other | Source: Ambulatory Visit | Attending: Family Medicine | Admitting: Family Medicine

## 2017-07-08 DIAGNOSIS — M8580 Other specified disorders of bone density and structure, unspecified site: Secondary | ICD-10-CM | POA: Insufficient documentation

## 2017-07-08 DIAGNOSIS — Z13820 Encounter for screening for osteoporosis: Secondary | ICD-10-CM | POA: Insufficient documentation

## 2017-07-08 DIAGNOSIS — M810 Age-related osteoporosis without current pathological fracture: Secondary | ICD-10-CM

## 2017-07-09 ENCOUNTER — Encounter: Payer: Self-pay | Admitting: Family Medicine

## 2017-07-30 ENCOUNTER — Inpatient Hospital Stay (HOSPITAL_COMMUNITY): Payer: Medicare Other

## 2017-07-30 ENCOUNTER — Encounter (HOSPITAL_COMMUNITY): Payer: Self-pay | Admitting: Hematology

## 2017-07-30 ENCOUNTER — Inpatient Hospital Stay (HOSPITAL_COMMUNITY): Payer: Medicare Other | Attending: Hematology | Admitting: Hematology

## 2017-07-30 ENCOUNTER — Other Ambulatory Visit: Payer: Self-pay

## 2017-07-30 VITALS — BP 147/65 | HR 68 | Temp 98.1°F | Resp 18 | Ht 66.0 in | Wt 212.4 lb

## 2017-07-30 DIAGNOSIS — E119 Type 2 diabetes mellitus without complications: Secondary | ICD-10-CM | POA: Diagnosis not present

## 2017-07-30 DIAGNOSIS — N189 Chronic kidney disease, unspecified: Secondary | ICD-10-CM | POA: Diagnosis not present

## 2017-07-30 DIAGNOSIS — K7460 Unspecified cirrhosis of liver: Secondary | ICD-10-CM

## 2017-07-30 DIAGNOSIS — D6959 Other secondary thrombocytopenia: Secondary | ICD-10-CM | POA: Insufficient documentation

## 2017-07-30 DIAGNOSIS — E039 Hypothyroidism, unspecified: Secondary | ICD-10-CM | POA: Diagnosis not present

## 2017-07-30 DIAGNOSIS — D696 Thrombocytopenia, unspecified: Secondary | ICD-10-CM | POA: Insufficient documentation

## 2017-07-30 DIAGNOSIS — R161 Splenomegaly, not elsewhere classified: Secondary | ICD-10-CM | POA: Diagnosis not present

## 2017-07-30 LAB — CBC WITH DIFFERENTIAL/PLATELET
Basophils Absolute: 0 10*3/uL (ref 0.0–0.1)
Basophils Relative: 0 %
Eosinophils Absolute: 0.2 10*3/uL (ref 0.0–0.7)
Eosinophils Relative: 3 %
HCT: 37.3 % (ref 36.0–46.0)
Hemoglobin: 12 g/dL (ref 12.0–15.0)
LYMPHS ABS: 1.5 10*3/uL (ref 0.7–4.0)
Lymphocytes Relative: 21 %
MCH: 30.8 pg (ref 26.0–34.0)
MCHC: 32.2 g/dL (ref 30.0–36.0)
MCV: 95.6 fL (ref 78.0–100.0)
Monocytes Absolute: 0.4 10*3/uL (ref 0.1–1.0)
Monocytes Relative: 6 %
Neutro Abs: 4.9 10*3/uL (ref 1.7–7.7)
Neutrophils Relative %: 70 %
Platelets: 85 10*3/uL — ABNORMAL LOW (ref 150–400)
RBC: 3.9 MIL/uL (ref 3.87–5.11)
RDW: 14.2 % (ref 11.5–15.5)
WBC: 7 10*3/uL (ref 4.0–10.5)

## 2017-07-30 LAB — FOLATE: Folate: 17.9 ng/mL (ref >5.9)

## 2017-07-30 LAB — VITAMIN B12: VITAMIN B 12: 798 pg/mL (ref 180–914)

## 2017-07-30 NOTE — Progress Notes (Signed)
[place]  CONSULT NOTE  Patient Care Team: Caren Macadam, MD as PCP - General (Family Medicine)  CHIEF COMPLAINTS/PURPOSE OF CONSULTATION:  Thrombocytopenia   HISTORY OF PRESENTING ILLNESS:  [name] 67 y.o. female is here for new patient consultation for thrombocytopenia.   Her low platelet count dates back to at least 2015.  Her baseline plts appear to be in ~100,000 range for several years.  Most recent plt count was 66,000 on 07/06/17.    Overall, she feels "pretty good."  She lives alone and performs all ALDs independently. She was recently started on doxycycline for rosacea, which she took for the past month or so.  Endorses frequent dizziness.  Denies fever, night sweats (aside from low blood sugar), or unintentional weight loss.  She has lost 25+ lbs since October, but she has been trying to eat a better diet.    Denies hematemesis. Denies frank bleeding episodes. Endorses easy bruising, particularly within the past 6 months. She has occasional headaches.  Denies vision changes/diplopia.    Reports that she recently had EGD with Dr. Collene Mares with [facility].  She was recently diagnosed with NASH and cirrhosis. Noted to have splenomegaly on CT in 02/2017.  Denies h/o lupus or rheumatoid arthritis.     MEDICAL HISTORY:  Past Medical History:  Diagnosis Date  . Alkaline phosphatase elevation   . Anemia   . ASD (atrial septal defect)   . Cataract    both eyes hx of  . Chest pain    03-17-2013 last chest pain  . Chronic kidney disease    Stage III kidney disease  . Cirrhosis ([place])   . Depression   . Diabetes mellitus type II   . Fatigue   . GERD (gastroesophageal reflux disease)   . Gout   . Headache(784.0)    occasional  . Hyperlipidemia   . Hypothyroidism   . Neuropathy   . Peripheral neuropathy   . Retinopathy     SURGICAL HISTORY: Past Surgical History:  Procedure Laterality Date  . CATARACT EXTRACTION    . CERVICAL SPINE SURGERY  2006   . CHOLECYSTECTOMY N/A 04/07/2013   Procedure: LAPAROSCOPIC CHOLECYSTECTOMY WITH INTRAOPERATIVE CHOLANGIOGRAM;  Surgeon: Merrie Roof, MD;  Location: [facility];  Service: General;  Laterality: N/A;  . COMBINED HYSTERECTOMY VAGINAL / OOPHORECTOMY / A&P REPAIR  1987   Unilateral oophorectomy, h/o uterine prolapse has right ovary  . ERCP N/A 04/06/2013   Procedure: ENDOSCOPIC RETROGRADE CHOLANGIOPANCREATOGRAPHY (ERCP);  Surgeon: Beryle Beams, MD;  Location: [facility];  Service: Endoscopy;  Laterality: N/A;  . EUS N/A 03/31/2013   Procedure: UPPER ENDOSCOPIC ULTRASOUND (EUS) LINEAR;  Surgeon: Beryle Beams, MD;  Location: [facility];  Service: Endoscopy;  Laterality: N/A;  . REFRACTIVE SURGERY    . SPINAL FUSION    . TONSILLECTOMY  age 9    SOCIAL HISTORY: Social History   Socioeconomic History  . Marital status: Divorced    Spouse name: Not on file  . Number of children: 3  . Years of education: 56  . Highest education level: Not on file  Occupational History  . 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 Financial resource strain: Not on file  . Food insecurity:    Worry: Not on file    Inability: Not on file  . Transportation needs:    Medical: Not on file    Non-medical: Not on file  Tobacco Use  .  Smoking status: Never Smoker  . Smokeless tobacco: Never Used  Substance and Sexual Activity  . Alcohol use: No  . Drug use: No  . Sexual activity: Not Currently  Lifestyle  . Physical activity:    Days per week: Not on file    Minutes per session: Not on file  . Stress: Not on file  Relationships  . Social connections:    Talks on phone: Not on file    Gets together: Not on file    Attends religious service: Not on file    Active member of club or organization: Not on file    Attends meetings of clubs or organizations: Not on file    Relationship status: Not on file  . Intimate partner violence:    Fear of current or ex partner: Not  on file    Emotionally abused: Not on file    Physically abused: Not on file    Forced sexual activity: Not on file  Other Topics Concern  . Not on file  Social History Narrative   Divorced   Lives alone. Reports that her son recently moved out after living with her for a long time. She reports that she is happy to be living alone and feels like she was enabling his behavior. Reports that he had a history of drug use.   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    No pets.   Eats all food groups.    Wears seat belt.    Lives in house.    Smoke detectors.     FAMILY HISTORY: Family History  Problem Relation Age of Onset  . COPD Mother   . Lung cancer Father   . Diabetes Sister   . Cataracts Sister   . Insulin resistance Daughter   . Insulin resistance Son     ALLERGIES:  is allergic to statins and dilaudid [hydromorphone hcl].  MEDICATIONS:  Current Outpatient Medications  Medication Sig Dispense Refill  . allopurinol (ZYLOPRIM) 100 MG tablet Take 100 mg by mouth every evening.     . ciprofloxacin (CIPRO) 500 MG tablet Take 500 mg by mouth 2 (two) times daily.    [name] doxycycline (VIBRA-TABS) 100 MG tablet Take 1 tablet (100 mg total) by mouth 2 (two) times daily. 60 tablet 0  . fluconazole (DIFLUCAN) 150 MG tablet Take 1 tablet (150 mg total) by mouth every 3 (three) days. 3 tablet 0  . hydrOXYzine (ATARAX/VISTARIL) 25 MG tablet Take 25 mg by mouth 3 (three) times daily as needed for anxiety or itching (Helps with neuropathy).     . Insulin NPH Human, Isophane, (NOVOLIN N [place]) Inject 50 Units into the skin 2 (two) times daily.    . insulin regular (NOVOLIN R,HUMULIN R) 100 units/mL injection Inject 5-20 Units into the skin 3 (three) times daily before meals.    [name] levothyroxine (SYNTHROID, LEVOTHROID) 150 MCG tablet Take 150 mcg by mouth daily before breakfast.    . metroNIDAZOLE (METROGEL) 1 % gel Apply topically daily. 45  g 0  . ranitidine (ZANTAC) 300 MG tablet Take 300 mg by mouth at bedtime.    [name] terconazole (TERAZOL 7) 0.4 % vaginal cream Place 1 applicator vaginally at bedtime. 45 g 0  . traMADol (ULTRAM) 50 MG tablet Take 1 tablet (50 mg total) by mouth every 6 (six) hours as needed. 10 tablet 0   No current  facility-administered medications for this visit.     REVIEW OF SYSTEMS:   Constitutional: Denies fevers, chills or abnormal night sweats.  She has occasional sweating when her sugar drops.  She also complains of occasional dizziness. Eyes: Denies blurriness of vision, double vision or watery eyes Ears, nose, mouth, throat, and face: Denies mucositis or sore throat Respiratory: Denies cough, dyspnea or wheezes Cardiovascular: Denies palpitation, chest discomfort or lower extremity swelling Gastrointestinal:  Denies nausea, heartburn or change in bowel habits Skin: Denies abnormal skin rashes.  Easy bruising for the last 6 months. Lymphatics: Denies new lymphadenopathy or easy bruising Neurological:Denies numbness, tingling or new weaknesses Behavioral/Psych: Mood is stable, no new changes  All other systems were reviewed with the patient and are negative.  PHYSICAL EXAMINATION: ECOG PERFORMANCE STATUS: 1 - Symptomatic but completely ambulatory  Vitals:   07/30/17 1120  BP: (!) 147/65  Pulse: 68  Resp: 18  Temp: 98.1 F (36.7 C)  SpO2: 98%   Filed Weights   07/30/17 1120  Weight: 212 lb 6.4 oz (96.3 kg)    GENERAL:alert, no distress and comfortable SKIN: skin color, texture, turgor are normal, no rashes or significant lesions EYES: normal, conjunctiva are pink and non-injected, sclera clear OROPHARYNX:no exudate, no erythema and lips, buccal mucosa, and tongue normal  NECK: supple, thyroid normal size, non-tender, without nodularity LYMPH:  no palpable lymphadenopathy in the cervical, axillary or inguinal LUNGS: clear to auscultation and percussion with normal breathing  effort HEART: regular rate & rhythm and no murmurs and no lower extremity edema ABDOMEN:abdomen soft, non-tender and normal bowel sounds Musculoskeletal:no cyanosis of digits and no clubbing  PSYCH: alert & oriented x 3 with fluent speech   LABORATORY DATA:  I have reviewed the data as listed Recent Results (from the past 2160 hour(s))  HIV antibody (with reflex)     Status: None   Collection Time: 07/02/17  3:14 PM  Result Value Ref Range   HIV 1&2 Ab, 4th Generation NON-REACTIVE NON-REACTI    Comment: HIV-1 antigen and HIV-1/HIV-2 antibodies were not detected. There is no laboratory evidence of HIV infection. [name] PLEASE NOTE: This information has been disclosed to you from records whose confidentiality may be protected by state law.  If your state requires such protection, then the state law prohibits you from making any further disclosure of the information without the specific written consent of the person to whom it pertains, or as otherwise permitted by law. A general authorization for the release of medical or other information is NOT sufficient for this purpose. . For additional information please refer to http://education.questdiagnostics.com/faq/FAQ106 (This link is being provided for informational/ educational purposes only.) . [name] The performance of this assay has not been clinically validated in patients less than 72 years old. .   Hepatitis C antibody     Status: None   Collection Time: 07/02/17  3:14 PM  Result Value Ref Range   Hepatitis C Ab NON-REACTIVE NON-REACTI   SIGNAL TO CUT-OFF 0.03 <1.00    Comment: . HCV antibody was non-reactive. There is no laboratory  evidence of HCV infection. . In most cases, no further action is required. However, if recent HCV exposure is suspected, a test for HCV RNA (test code [phone]) is suggested. . For additional information please refer to http://education.questdiagnostics.com/faq/FAQ22v1 (This link is being  provided for informational/ educational purposes only.) .   VITAMIN D 25 Hydroxy (Vit-D Deficiency, Fractures)     Status: Abnormal   Collection Time: 07/02/17  3:14  PM  Result Value Ref Range   Vit D, 25-Hydroxy 28 (L) 30 - 100 ng/mL    Comment: Vitamin D Status         25-OH Vitamin D: . Deficiency:                    <20 ng/mL Insufficiency:             20 - 29 ng/mL Optimal:                 > or = 30 ng/mL . For 25-OH Vitamin D testing on patients on  D2-supplementation and patients for whom quantitation  of D2 and D3 fractions is required, the QuestAssureD(TM) 25-OH VIT D, (D2,D3), LC/MS/MS is recommended: order  code [id number] (patients >52yrs). . For more information on this test, go to: http://education.questdiagnostics.com/faq/FAQ163 (This link is being provided for  informational/educational purposes only.)   CBC with Differential/Platelet     Status: Abnormal   Collection Time: 07/06/17  8:40 AM  Result Value Ref Range   WBC 5.3 3.8 - 10.8 Thousand/uL   RBC 3.65 (L) 3.80 - 5.10 Million/uL   Hemoglobin 11.7 11.7 - 15.5 g/dL   HCT 33.7 (L) 35.0 - 45.0 %   MCV 92.3 80.0 - 100.0 fL   MCH 32.1 27.0 - 33.0 pg   MCHC 34.7 32.0 - 36.0 g/dL   RDW 13.2 11.0 - 15.0 %   Platelets 66 (L) 140 - 400 Thousand/uL    Comment: Verified by repeat analysis. [name]    MPV 13.3 (H) 7.5 - 12.5 fL   Neutro Abs 2,740 1,500 - 7,800 cells/uL   Lymphs Abs 1,966 850 - 3,900 cells/uL   WBC mixed population 350 200 - 950 cells/uL   Eosinophils Absolute 207 15 - 500 cells/uL   Basophils Absolute 37 0 - 200 cells/uL   Neutrophils Relative % 51.7 %   Total Lymphocyte 37.1 %   Monocytes Relative 6.6 %   Eosinophils Relative 3.9 %   Basophils Relative 0.7 %    RADIOGRAPHIC STUDIES: I have personally reviewed the radiological images as listed and agreed with the findings in the report. Dg Bone Density  Result Date: 07/08/2017 EXAM: DUAL X-RAY ABSORPTIOMETRY (DXA) FOR BONE MINERAL DENSITY  IMPRESSION: Ordering Physician:  Dr. Caren Macadam, Your patient [name] completed a BMD test on 07/08/2017 using the [place] (software version: 14.10) manufactured by [employer]. The following summarizes the results of our evaluation. PATIENT BIOGRAPHICAL: Name: [name], [name] Patient ID: [id number] Birth Date: [birth year] Height: 66.0 in. Gender: Female Exam Date: 07/08/2017 Weight: 208.0 lbs. Indications: Caucasian, Diabetic-insulin dependent, Height Loss, History of Fracture (Adult), Low Calcium Intake, Post Menopausal, Renal, Unilateral oophrectomy, Secondary Osteoporosis Fractures: Toe Treatments: Synthroid, Vitamin D DENSITOMETRY RESULTS: Site      Region     Measured Date Measured Age WHO Classification Young Adult T-score BMD         %Change vs. Previous Significant Change (*) AP Spine L1-L2 07/08/2017 65.5 Normal -0.2 1.137 g/cm2 - - DualFemur Neck Left 07/08/2017 65.5 Osteopenia -1.4 0.845 g/cm2 - - DualFemur Total Mean 07/08/2017 65.5 Normal -0.8 0.912 g/cm2 - - ASSESSMENT: The BMD measured at Femur Neck Left is 0.845 g/cm2 with a T-score of -1.4. This patient is considered osteopenic according to [facility]) criteria. L-3 and L-4 were excluded due to advanced degenerative changes. World Health Organization [facility]) criteria for post-menopausal, Caucasian Women: Normal:  T-score at or above -1 SD Osteopenia:   T-score between -1 and -2.5 SD Osteoporosis: T-score at or below -2.5 SD RECOMMENDATIONS: 1. All patients should optimize calcium and vitamin D intake. 2. Consider FDA-approved medical therapies in postmenopausal women and med aged 61 years and older, based on the following: a. A hip or vertebral (clinical or morphometric) fracture b. T-score< -2.5 at the femoral neck or spine after appropriate evaluation to exclude secondary causes c. Low bone mass (T-score between -1.0 and -2.5 at the femoral neck or spine) and a 10-year probability of a hip  fracture > 3% or a 10-year probability of a major osteoporosis-related fracture > 20% based on the [place]-adapted WHO algorithm d. Clinician judgment and/or patient preferences may indicate treatment for people with 10-year fracture probabilities above or below these levels FOLLOW-UP: People with diagnosed cases of osteoporosis or at high risk for fracture should have regular bone mineral density tests. For patients eligible for Medicare, routine testing is allowed once every 2 years. The testing frequency can be increased to one year for patients who have rapidly progressing disease, those who are receiving or discontinuing medical therapy to restore bone mass, or have additional risk factors. I have reviewed this report, and agree with the above findings. [facility] Dear Dr. Caren Macadam, Your patient [name] completed a FRAX assessment on 07/08/2017 using the [place] (analysis version: 14.10) manufactured by [employer]. The following summarizes the results of our evaluation. PATIENT BIOGRAPHICAL: Name: [name], [name] Patient ID: [id number] Birth Date: [birth year] Height:    66.0 in. Gender:     Female    [age]       Weight:    208.0 lbs. Ethnicity:  White                            Exam Date: 07/08/2017 FRAX* RESULTS:  (version: 3.5) 10-year Probability of Fracture1 Major Osteoporotic Fracture2 Hip Fracture 13.6% 1.3% Population: [place] (Caucasian) Risk Factors: History of Fracture (Adult), Secondary Osteoporosis Based on Femur (Left) Neck BMD 1 -The 10-year probability of fracture may be lower than reported if the patient has received treatment. 2 -Major Osteoporotic Fracture: Clinical Spine, Forearm, Hip or Shoulder *FRAX is a Materials engineer of the [employer] for Metabolic Bone Disease, a [place] (WHO) [facility]. ASSESSMENT: The probability of a major osteoporotic fracture is 13.6% within the next ten  years. The probability of a hip fracture is 1.3% within the next ten years. Electronically Signed   By: Lowella Grip III M.D.   On: 07/08/2017 13:25    ASSESSMENT & PLAN:  Thrombocytopenia ([place]) 1.  Moderate thrombocytopenia: - Her CBC on 07/06/2017 shows a platelet count of 66.  I have reviewed her labs over the past few years.  She has mild thrombocytopenia, mostly above 100 since 2015.  She also has recently diagnosed Karlene Lineman with cirrhosis and splenomegaly. - Differential diagnosis includes thrombocytopenia secondary to splenomegaly, immune mediated thrombocytopenia and possible MDS.  She also finished 1 month of doxycycline around the time of the labs.  She reports easy bruising in the last 6 months.  No major or minor bleeding reported.  Her recent blood work including HIV and hep C were negative.  Today we will repeat her CBC and review her smear.  We will send A83, folic acid, copper to check for nutritional  deficiencies.  We will also do connective tissue disorder work-up with ANA, rheumatoid factor, lupus anticoagulant, anticardiolipin antibodies, anti-beta-2 glycoprotein 1 antibodies.  We will also check her H. pylori serology.  She will come back in 2 to 3 weeks to discuss the results.     All questions were answered. The patient knows to call the clinic with any problems, questions or concerns.   This note includes documentation from Mike Craze, NP, who was present during this patient's office visit and evaluation.  I have reviewed this note for its completeness and accuracy.  I have edited this note accordingly based on my findings and medical opinion.      Derek Jack, MD 07/30/17 12:08 PM

## 2017-07-30 NOTE — Assessment & Plan Note (Signed)
1.  Moderate thrombocytopenia: - Her CBC on 07/06/2017 shows a platelet count of 66.  I have reviewed her labs over the past few years.  She has mild thrombocytopenia, mostly above 100 since 2015.  She also has recently diagnosed Karlene Lineman with cirrhosis and splenomegaly. - Differential diagnosis includes thrombocytopenia secondary to splenomegaly, immune mediated thrombocytopenia and possible MDS.  She also finished 1 month of doxycycline around the time of the labs.  She reports easy bruising in the last 6 months.  No major or minor bleeding reported.  Her recent blood work including HIV and hep C were negative.  Today we will repeat her CBC and review her smear.  We will send Y07, folic acid, copper to check for nutritional deficiencies.  We will also do connective tissue disorder work-up with ANA, rheumatoid factor, lupus anticoagulant, anticardiolipin antibodies, anti-beta-2 glycoprotein 1 antibodies.  We will also check her H. pylori serology.  She will come back in 2 to 3 weeks to discuss the results.

## 2017-07-30 NOTE — Patient Instructions (Signed)
[facility] at [facility] Discharge Instructions  Today you saw Dr. K.   Thank you for choosing [facility] at [facility] to provide your oncology and hematology care.  To afford each patient quality time with our provider, please arrive at least 15 minutes before your scheduled appointment time.   If you have a lab appointment with the Cancer Center please come in thru the  Main Entrance and check in at the main information desk  You need to re-schedule your appointment should you arrive 10 or more minutes late.  We strive to give you quality time with our providers, and arriving late affects you and other patients whose appointments are after yours.  Also, if you no show three or more times for appointments you may be dismissed from the clinic at the providers discretion.     Again, thank you for choosing [facility].  Our hope is that these requests will decrease the amount of time that you wait before being seen by our physicians.       _____________________________________________________________  Should you have questions after your visit to [facility], please contact our office at [phone] between the hours of 8:30 a.m. and 4:30 p.m.  Voicemails left after 4:30 p.m. will not be returned until the following business day.  For prescription refill requests, have your pharmacy contact our office.       Resources For Cancer Patients and their Caregivers ? American Cancer Society: Can assist with transportation, wigs, general needs, runs Look Good Feel Better.        1-888-227-6333 ? Cancer Care: Provides financial assistance, online support groups, medication/co-pay assistance.  1-800-813-HOPE (4673) ? [facility] Assists [place] cancer patients and their families through emotional , educational and financial support.  336-427-4357 ? [place] DSS Where to apply for food  stamps, Medicaid and utility assistance. 336-342-1394 ? [facility]: Transportation to medical appointments. 336-347-2287 ? Social Security Administration: May apply for disability if have a Stage IV cancer. 336-342-7796 1-800-772-1213 ? [place] Aging, Disability and Transit Services: Assists with nutrition, care and transit needs. 336-349-2343  Cancer Center Support Programs:   > Cancer Support Group  2nd Tuesday of the month 1pm-2pm, Journey Room   > Creative Journey  3rd Tuesday of the month 1130am-1pm, Journey Room    

## 2017-07-31 LAB — LUPUS ANTICOAGULANT PANEL
DRVVT: 35.7 s (ref 0.0–47.0)
PTT LA: 45.8 s (ref 0.0–51.9)

## 2017-07-31 LAB — RHEUMATOID FACTOR: Rhuematoid fact SerPl-aCnc: <10 IU/mL (ref 0.0–13.9)

## 2017-08-01 LAB — CARDIOLIPIN ANTIBODIES, IGG, IGM, IGA
Anticardiolipin IgG: 12 GPL U/mL (ref 0–14)
Anticardiolipin IgM: 13 MPL U/mL — ABNORMAL HIGH (ref 0–12)

## 2017-08-01 LAB — COPPER, SERUM: COPPER: 119 ug/dL (ref 72–166)

## 2017-08-02 LAB — BETA-2-GLYCOPROTEIN I ABS, IGG/M/A
Beta-2 Glyco I IgG: <9 GPI IgG units (ref 0–20)
Beta-2-Glycoprotein I IgM: <9 GPI IgM units (ref 0–32)

## 2017-08-02 LAB — H PYLORI, IGM, IGG, IGA AB
H. Pylogi, Iga Abs: 11.6 units — ABNORMAL HIGH (ref 0.0–8.9)
H. Pylogi, Igm Abs: <9 units (ref 0.0–8.9)

## 2017-08-02 LAB — PATHOLOGIST SMEAR REVIEW

## 2017-08-03 LAB — ANTINUCLEAR ANTIBODIES, IFA: ANA Ab, IFA: POSITIVE — AB

## 2017-08-03 LAB — FANA STAINING PATTERNS: Speckled Pattern: 1:80 {titer}

## 2017-08-20 ENCOUNTER — Inpatient Hospital Stay (HOSPITAL_BASED_OUTPATIENT_CLINIC_OR_DEPARTMENT_OTHER): Payer: Medicare Other | Admitting: Hematology

## 2017-08-20 ENCOUNTER — Other Ambulatory Visit: Payer: Self-pay

## 2017-08-20 ENCOUNTER — Encounter (HOSPITAL_COMMUNITY): Payer: Self-pay | Admitting: Hematology

## 2017-08-20 VITALS — BP 117/54 | HR 73 | Temp 98.5°F | Resp 18 | Wt 212.5 lb

## 2017-08-20 DIAGNOSIS — D6959 Other secondary thrombocytopenia: Secondary | ICD-10-CM | POA: Diagnosis not present

## 2017-08-20 DIAGNOSIS — N183 Chronic kidney disease, stage 3 (moderate): Secondary | ICD-10-CM | POA: Diagnosis not present

## 2017-08-20 DIAGNOSIS — D696 Thrombocytopenia, unspecified: Secondary | ICD-10-CM | POA: Diagnosis not present

## 2017-08-20 DIAGNOSIS — E119 Type 2 diabetes mellitus without complications: Secondary | ICD-10-CM

## 2017-08-20 DIAGNOSIS — M859 Disorder of bone density and structure, unspecified: Secondary | ICD-10-CM | POA: Diagnosis not present

## 2017-08-20 NOTE — Assessment & Plan Note (Signed)
1.  Moderate thrombocytopenia: - Her CBC on 07/06/2017 shows a platelet count of 66.  She has mild thrombocytopenia, mostly above 100 since 2015.  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 - Differential diagnosis includes thrombocytopenia secondary to splenomegaly, immune mediated thrombocytopenia and possible MDS. -I discussed the results of her blood tests.  Nutritional deficiency work-up was negative.  ANA was 1:80 speckled pattern positive, although weakly.  She does not have any clinical signs or symptoms of lupus.  Hence I would consider it as false positive. -Her platelet count improved to 85.  At this time no active intervention necessary.  We will follow her in 4 months and repeat platelet count.  If they continue to be stable, we will switch her to every 49-month visits.  2.  Osteopenia: -Her last DEXA scan on 07/08/2017 shows T score of -1.4 in the left femoral neck.  This needs to be followed up.  We would recommend calcium and vitamin D supplements and weightbearing exercises.

## 2017-08-20 NOTE — Progress Notes (Signed)
[place]  FOLLOW-UP NOTE  Patient Care Team: Caren Macadam, MD as PCP - General (Family Medicine)  CHIEF COMPLAINTS:  Thrombocytopenia   HISTORY OF PRESENTING ILLNESS:  [name] 67 y.o. female is here for new patient consultation for thrombocytopenia.   Her low platelet count dates back to at least 2015.  Her baseline plts appear to be in ~100,000 range for several years.  Most recent plt count was 66,000 on 07/06/17.    Overall, she feels "pretty good."  She lives alone and performs all ALDs independently. She was recently started on doxycycline for rosacea, which she took for the past month or so.  Endorses frequent dizziness.  Denies fever, night sweats (aside from low blood sugar), or unintentional weight loss.  She has lost 25+ lbs since October, but she has been trying to eat a better diet.    Denies hematemesis. Denies frank bleeding episodes. Endorses easy bruising, particularly within the past 6 months. She has occasional headaches.  Denies vision changes/diplopia.   Reports that she recently had EGD with Dr. Collene Mares with [facility].  She was recently diagnosed with NASH and cirrhosis. Noted to have splenomegaly on CT in 02/2017.  Denies h/o lupus or rheumatoid arthritis.    INTERVAL HISTORY:  [name] 67 y.o. female here for routine follow-up for thrombocytopenia.   Overall, she states that she is feeling better.   Recent lab work reviewed with her.  Her ANA was weakly positive.  Otherwise, work-up was largely within normal limits.   Will plan to bring her back in 4 months to repeat platelet count at that time.    MEDICAL HISTORY:  Past Medical History:  Diagnosis Date  . Alkaline phosphatase elevation   . Anemia   . ASD (atrial septal defect)   . Cataract    both eyes hx of  . Chest pain    03-17-2013 last chest pain  . Chronic kidney disease    Stage III kidney disease  . Cirrhosis ([place])   . Depression   . Diabetes mellitus type II   .  Fatigue   . GERD (gastroesophageal reflux disease)   . Gout   . Headache(784.0)    occasional  . Hyperlipidemia   . Hypothyroidism   . Neuropathy   . Peripheral neuropathy   . Retinopathy     SURGICAL HISTORY: Past Surgical History:  Procedure Laterality Date  . CATARACT EXTRACTION    . CERVICAL SPINE SURGERY  2006  . CHOLECYSTECTOMY N/A 04/07/2013   Procedure: LAPAROSCOPIC CHOLECYSTECTOMY WITH INTRAOPERATIVE CHOLANGIOGRAM;  Surgeon: Merrie Roof, MD;  Location: [facility];  Service: General;  Laterality: N/A;  . COMBINED HYSTERECTOMY VAGINAL / OOPHORECTOMY / A&P REPAIR  1987   Unilateral oophorectomy, h/o uterine prolapse has right ovary  . ERCP N/A 04/06/2013   Procedure: ENDOSCOPIC RETROGRADE CHOLANGIOPANCREATOGRAPHY (ERCP);  Surgeon: Beryle Beams, MD;  Location: [facility];  Service: Endoscopy;  Laterality: N/A;  . EUS N/A 03/31/2013   Procedure: UPPER ENDOSCOPIC ULTRASOUND (EUS) LINEAR;  Surgeon: Beryle Beams, MD;  Location: [facility];  Service: Endoscopy;  Laterality: N/A;  . REFRACTIVE SURGERY    . SPINAL FUSION    . TONSILLECTOMY  age 66    SOCIAL HISTORY: Social History   Socioeconomic History  . Marital status: Divorced    Spouse name: Not on file  . Number of children: 3  . Years of education: 72  . Highest education level: Not on file  Occupational History  . 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 Financial resource strain: Not on file  . Food insecurity:    Worry: Not on file    Inability: Not on file  . Transportation needs:    Medical: Not on file    Non-medical: Not on file  Tobacco Use  . Smoking status: Never Smoker  . Smokeless tobacco: Never Used  Substance and Sexual Activity  . Alcohol use: No  . Drug use: No  . Sexual activity: Not Currently  Lifestyle  . Physical activity:    Days per week: Not on file    Minutes per session: Not on file  . Stress: Not on file  Relationships  . Social  connections:    Talks on phone: Not on file    Gets together: Not on file    Attends religious service: Not on file    Active member of club or organization: Not on file    Attends meetings of clubs or organizations: Not on file    Relationship status: Not on file  . Intimate partner violence:    Fear of current or ex partner: Not on file    Emotionally abused: Not on file    Physically abused: Not on file    Forced sexual activity: Not on file  Other Topics Concern  . Not on file  Social History Narrative   Divorced   Lives alone. Reports that her son recently moved out after living with her for a long time. She reports that she is happy to be living alone and feels like she was enabling his behavior. Reports that he had a history of drug use.   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    No pets.   Eats all food groups.    Wears seat belt.    Lives in house.    Smoke detectors.     FAMILY HISTORY: Family History  Problem Relation Age of Onset  . COPD Mother   . Lung cancer Father   . Diabetes Sister   . Cataracts Sister   . Insulin resistance Daughter   . Insulin resistance Son     ALLERGIES:  is allergic to statins and dilaudid [hydromorphone hcl].  MEDICATIONS:  Current Outpatient Medications  Medication Sig Dispense Refill  . allopurinol (ZYLOPRIM) 100 MG tablet Take 100 mg by mouth every evening.     . hydrOXYzine (ATARAX/VISTARIL) 25 MG tablet Take 25 mg by mouth 3 (three) times daily as needed for anxiety or itching (Helps with neuropathy).     . Insulin NPH Human, Isophane, (NOVOLIN N [place]) Inject 50 Units into the skin 2 (two) times daily.    . insulin regular (NOVOLIN R,HUMULIN R) 100 units/mL injection Inject 5-20 Units into the skin 3 (three) times daily before meals.    [name] levothyroxine (SYNTHROID, LEVOTHROID) 150 MCG tablet Take 150 mcg by mouth daily before breakfast.    . metroNIDAZOLE (METROGEL) 1  % gel Apply topically daily. 45 g 0  . ranitidine (ZANTAC) 300 MG tablet Take 300 mg by mouth at bedtime.    [name] terconazole (TERAZOL 7) 0.4 % vaginal cream Place 1 applicator vaginally at bedtime. 45 g 0  . traMADol (ULTRAM) 50 MG tablet Take 1 tablet (50 mg total) by mouth every 6 (six) hours as needed. 10 tablet 0  No current facility-administered medications for this visit.     REVIEW OF SYSTEMS:   Constitutional: Denies fevers, chills or abnormal night sweats.  She has occasional sweating when her sugar drops.  She also complains of occasional dizziness. Eyes: Denies blurriness of vision, double vision or watery eyes Ears, nose, mouth, throat, and face: Denies mucositis or sore throat Respiratory: Denies cough, dyspnea or wheezes Cardiovascular: Denies palpitation, chest discomfort or lower extremity swelling Gastrointestinal:  Denies nausea, heartburn or change in bowel habits Skin: Denies abnormal skin rashes.  Easy bruising for the last 6 months. Lymphatics: Denies new lymphadenopathy or easy bruising Neurological:Denies numbness, tingling or new weaknesses Behavioral/Psych: Mood is stable, no new changes  All other systems were reviewed with the patient and are negative.  PHYSICAL EXAMINATION: ECOG PERFORMANCE STATUS: 1 - Symptomatic but completely ambulatory  Vitals:   08/20/17 1420  BP: (!) 117/54  Pulse: 73  Resp: 18  Temp: 98.5 F (36.9 C)  SpO2: 98%   Filed Weights   08/20/17 1420  Weight: 212 lb 8 oz (96.4 kg)    GENERAL:alert, no distress and comfortable SKIN: skin color, texture, turgor are normal, no rashes or significant lesions EYES: normal, conjunctiva are pink and non-injected, sclera clear   LABORATORY DATA:  I have reviewed the data as listed Recent Results (from the past 2160 hour(s))  HIV antibody (with reflex)     Status: None   Collection Time: 07/02/17  3:14 PM  Result Value Ref Range   HIV 1&2 Ab, 4th Generation NON-REACTIVE NON-REACTI     Comment: HIV-1 antigen and HIV-1/HIV-2 antibodies were not detected. There is no laboratory evidence of HIV infection. [name] PLEASE NOTE: This information has been disclosed to you from records whose confidentiality may be protected by state law.  If your state requires such protection, then the state law prohibits you from making any further disclosure of the information without the specific written consent of the person to whom it pertains, or as otherwise permitted by law. A general authorization for the release of medical or other information is NOT sufficient for this purpose. . For additional information please refer to http://education.questdiagnostics.com/faq/FAQ106 (This link is being provided for informational/ educational purposes only.) . [name] The performance of this assay has not been clinically validated in patients less than 35 years old. .   Hepatitis C antibody     Status: None   Collection Time: 07/02/17  3:14 PM  Result Value Ref Range   Hepatitis C Ab NON-REACTIVE NON-REACTI   SIGNAL TO CUT-OFF 0.03 <1.00    Comment: . HCV antibody was non-reactive. There is no laboratory  evidence of HCV infection. . In most cases, no further action is required. However, if recent HCV exposure is suspected, a test for HCV RNA (test code [phone]) is suggested. . For additional information please refer to http://education.questdiagnostics.com/faq/FAQ22v1 (This link is being provided for informational/ educational purposes only.) .   VITAMIN D 25 Hydroxy (Vit-D Deficiency, Fractures)     Status: Abnormal   Collection Time: 07/02/17  3:14 PM  Result Value Ref Range   Vit D, 25-Hydroxy 28 (L) 30 - 100 ng/mL    Comment: Vitamin D Status         25-OH Vitamin D: . Deficiency:                    <20 ng/mL Insufficiency:             20 - 29 ng/mL Optimal:                 >  or = 30 ng/mL . For 25-OH Vitamin D testing on patients on  D2-supplementation and patients for whom  quantitation  of D2 and D3 fractions is required, the QuestAssureD(TM) 25-OH VIT D, (D2,D3), LC/MS/MS is recommended: order  code [phone] (patients >64yrs). . For more information on this test, go to: http://education.questdiagnostics.com/faq/FAQ163 (This link is being provided for  informational/educational purposes only.)   CBC with Differential/Platelet     Status: Abnormal   Collection Time: 07/06/17  8:40 AM  Result Value Ref Range   WBC 5.3 3.8 - 10.8 Thousand/uL   RBC 3.65 (L) 3.80 - 5.10 Million/uL   Hemoglobin 11.7 11.7 - 15.5 g/dL   HCT 33.7 (L) 35.0 - 45.0 %   MCV 92.3 80.0 - 100.0 fL   MCH 32.1 27.0 - 33.0 pg   MCHC 34.7 32.0 - 36.0 g/dL   RDW 13.2 11.0 - 15.0 %   Platelets 66 (L) 140 - 400 Thousand/uL    Comment: Verified by repeat analysis. [name]    MPV 13.3 (H) 7.5 - 12.5 fL   Neutro Abs 2,740 1,500 - 7,800 cells/uL   Lymphs Abs 1,966 850 - 3,900 cells/uL   WBC mixed population 350 200 - 950 cells/uL   Eosinophils Absolute 207 15 - 500 cells/uL   Basophils Absolute 37 0 - 200 cells/uL   Neutrophils Relative % 51.7 %   Total Lymphocyte 37.1 %   Monocytes Relative 6.6 %   Eosinophils Relative 3.9 %   Basophils Relative 0.7 %  Vitamin B12     Status: None   Collection Time: 07/30/17 12:06 PM  Result Value Ref Range   Vitamin B-12 798 180 - 914 pg/mL    Comment: (NOTE) This assay is not validated for testing neonatal or myeloproliferative syndrome specimens for Vitamin B12 levels. Performed at [facility], [place] [address], [place], [place] [address]   Folate     Status: None   Collection Time: 07/30/17 12:06 PM  Result Value Ref Range   Folate 17.9 >5.9 ng/mL    Comment: Performed at [facility], [place] [address] [place], [place] [address]  CBC with Differential/Platelet     Status: Abnormal   Collection Time: 07/30/17 12:06 PM  Result Value Ref Range   WBC 7.0 4.0 - 10.5 K/uL   RBC 3.90 3.87 - 5.11 MIL/uL   Hemoglobin 12.0 12.0 - 15.0 g/dL    HCT 37.3 36.0 - 46.0 %   MCV 95.6 78.0 - 100.0 fL   MCH 30.8 26.0 - 34.0 pg   MCHC 32.2 30.0 - 36.0 g/dL   RDW 14.2 11.5 - 15.5 %   Platelets 85 (L) 150 - 400 K/uL    Comment: PLATELET COUNT CONFIRMED BY SMEAR SPECIMEN CHECKED FOR CLOTS    Neutrophils Relative % 70 %   Neutro Abs 4.9 1.7 - 7.7 K/uL   Lymphocytes Relative 21 %   Lymphs Abs 1.5 0.7 - 4.0 K/uL   Monocytes Relative 6 %   Monocytes Absolute 0.4 0.1 - 1.0 K/uL   Eosinophils Relative 3 %   Eosinophils Absolute 0.2 0.0 - 0.7 K/uL   Basophils Relative 0 %   Basophils Absolute 0.0 0.0 - 0.1 K/uL    Comment: Performed at [facility], [address] [place], [place] [address]  Pathologist smear review     Status: None   Collection Time: 07/30/17 12:06 PM  Result Value Ref Range   Path Review Reviewed By Violet Baldy, M.D.  Comment: 5.6.19 THROMBOCYTOPENIA Performed at [facility], [place] [address] [place], [place] [address]   Copper, serum     Status: None   Collection Time: 07/30/17 12:06 PM  Result Value Ref Range   Copper 119 72 - 166 ug/dL    Comment: (NOTE) This test was developed and its performance characteristics determined by [facility]. It has not been cleared or approved by the Food and Drug Administration.                                Detection Limit = 5 Performed At: [facility] [place], [place] [id number] Rush Farmer MD ID:[id number] Performed at [facility], [address], [place], [place] [address]   Lupus anticoagulant panel     Status: None   Collection Time: 07/30/17 12:06 PM  Result Value Ref Range   PTT Lupus Anticoagulant 45.8 0.0 - 51.9 sec   DRVVT 35.7 0.0 - 47.0 sec   Lupus Anticoag Interp Comment:     Comment: (NOTE) No lupus anticoagulant was detected. Performed At: [facility] [place], [place] [id number] Rush Farmer MD [id number] Performed at [facility], [address] [place], [place] [address]    Cardiolipin antibodies, IgG, IgM, IgA     Status: Abnormal   Collection Time: 07/30/17 12:06 PM  Result Value Ref Range   Anticardiolipin IgG 12 0 - 14 GPL U/mL    Comment: (NOTE)                          Negative:              <15                          Indeterminate:     15 - 20                          Low-Med Positive: >20 - 80                          High Positive:         >80    Anticardiolipin IgM 13 (H) 0 - 12 MPL U/mL    Comment: (NOTE)                          Negative:              <13                          Indeterminate:     13 - 20                          Low-Med Positive: >20 - 80                          High Positive:         >80    Anticardiolipin IgA <9 0 - 11 APL U/mL    Comment: (NOTE)                          Negative:              <  12                          Indeterminate:     12 - 20                          Low-Med Positive: >20 - 80                          High Positive:         >80 Performed At: [facility] [place], [place] [id number] Rush Farmer MD [id number] Performed at [facility], [address], [place], [place] [address]   Beta-2-glycoprotein i abs, IgG/M/A     Status: None   Collection Time: 07/30/17 12:06 PM  Result Value Ref Range   Beta-2 Glyco I IgG <9 0 - 20 GPI IgG units    Comment: (NOTE) The reference interval reflects a 3SD or 99th percentile interval, which is thought to represent a potentially clinically significant result in accordance with the International Consensus Statement on the classification criteria for definitive antiphospholipid syndrome (APS). J Thromb Haem 2006;4:295-306.    Beta-2-Glycoprotein I IgM <9 0 - 32 GPI IgM units    Comment: (NOTE) The reference interval reflects a 3SD or 99th percentile interval, which is thought to represent a potentially clinically significant result in accordance with the International Consensus Statement on the classification criteria for  definitive antiphospholipid syndrome (APS). J Thromb Haem 2006;4:295-306. Performed At: [facility] [place], [place] [id number] Rush Farmer MD [id number]    Beta-2-Glycoprotein I IgA <9 0 - 25 GPI IgA units    Comment: (NOTE) The reference interval reflects a 3SD or 99th percentile interval, which is thought to represent a potentially clinically significant result in accordance with the International Consensus Statement on the classification criteria for definitive antiphospholipid syndrome (APS). J Thromb Haem 2006;4:295-306. Performed at [facility], [address], [place], [place] [address]   ANA, IFA (with reflex)     Status: Abnormal   Collection Time: 07/30/17 12:06 PM  Result Value Ref Range   ANA Ab, IFA Positive (A)     Comment: (NOTE)                                     Negative   <1:80                                     Borderline  1:80                                     Positive   >1:80 Performed At: [facility] [place], [place] [id number] Rush Farmer MD [id number] Performed at [facility], [address], [place], [place] [address]   Rheumatoid factor     Status: None   Collection Time: 07/30/17 12:06 PM  Result Value Ref Range   Rhuematoid fact SerPl-aCnc <10.0 0.0 - 13.9 IU/mL    Comment: (NOTE) Performed At: [facility] [place], [place] [id number] Rush Farmer MD [id number] Performed at [facility], [address], [place], [place] [address]   H Pylori,  IGM, IGG, IGA AB     Status: Abnormal   Collection Time: 07/30/17 12:06 PM  Result Value Ref Range   H. Pylogi, Iga Abs 11.6 (H) 0.0 - 8.9 units    Comment: (NOTE)                                Negative          <9.0                                Equivocal   9.0 - 11.0                                Positive         >11.0    H. Pylogi, Igm Abs <9.0 0.0 - 8.9 units    Comment: (NOTE)                                 Negative          <9.0                                Equivocal   9.0 - 11.0                                Positive         >11.0 This test was developed and its performance characteristics determined by [facility]. It has not been cleared or approved by the Food and Drug Administration. Performed At: [facility] [place], [place] [id number] Rush Farmer MD [id number]    H Pylori IgG <0.80 0.00 - 0.79 Index Value    Comment: (NOTE)                             Negative           <0.80                             Equivocal    0.80 - 0.89                             Positive           >0.89 Performed at [facility], [address] [place], [place] [address]   FANA Staining Patterns     Status: None   Collection Time: 07/30/17 12:06 PM  Result Value Ref Range   Speckled Pattern 1:80    Note: Comment     Comment: (NOTE) A positive ANA result may occur in healthy individuals (low titer) or be associated with a variety of diseases.  See interpretation chart which is not all inclusive: Pattern      Antigen Detected  Suggested Disease Association -----------  ----------------  ----------------------------- Homogeneous  DNA(ds,ss),       SLE - High titers             Nucleosomes,  Histones          Drug-induced SLE -----------  ----------------  ----------------------------- Speckled     Sm, RNP, SCL-70,  SLE,MCTD,PSS (diffuse form),             SS-A/SS-B         Sjogrens -----------  ----------------  ----------------------------- Nucleolar    SCL-70, PM-1/SCL  High titers Scleroderma,                               PM/DM -----------  ----------------  ----------------------------- Centromere   Centromere        PSS (limited form) w/Crest                               syndrome variable -----------  ----------------  ----------------------------- Nuclear Dot  Sp100,p80-c oilin  Primary Biliary Cirrhosis -----------  ----------------   ----------------------------- Nuclear      GP210,            Primary Biliary Cirrhosis Membrane     lamin A,B,C -----------  ----------------  ----------------------------- Performed At: [facility] [place], [place] [id number] Rush Farmer MD [phone] Performed at [facility] [address] [place], [place] [address]     RADIOGRAPHIC STUDIES: I have reviewed her DEXA scan results from 07/08/2017. ASSESSMENT & PLAN:  Thrombocytopenia ([place]) 1.  Moderate thrombocytopenia: - Her CBC on 07/06/2017 shows a platelet count of 66.  She has mild thrombocytopenia, mostly above 100 since 2015.  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 - Differential diagnosis includes thrombocytopenia secondary to splenomegaly, immune mediated thrombocytopenia and possible MDS. -I discussed the results of her blood tests.  Nutritional deficiency work-up was negative.  ANA was 1:80 speckled pattern positive, although weakly.  She does not have any clinical signs or symptoms of lupus.  Hence I would consider it as false positive. -Her platelet count improved to 85.  At this time no active intervention necessary.  We will follow her in 4 months and repeat platelet count.  If they continue to be stable, we will switch her to every 67-month visits.  2.  Osteopenia: -Her last DEXA scan on 07/08/2017 shows T score of -1.4 in the left femoral neck.  This needs to be followed up.  We would recommend calcium and vitamin D supplements and weightbearing exercises.     All questions were answered. The patient knows to call the clinic with any problems, questions or concerns.   This note includes documentation from Mike Craze, NP, who was present during this patient's office visit and evaluation.  I have reviewed this note for its completeness and accuracy.  I have edited this note accordingly based on my findings and medical opinion.      Derek Jack,  MD 08/20/17 4:31 PM

## 2017-09-02 ENCOUNTER — Encounter: Payer: Self-pay | Admitting: Family Medicine

## 2017-09-03 ENCOUNTER — Encounter (INDEPENDENT_AMBULATORY_CARE_PROVIDER_SITE_OTHER): Payer: Medicare Other | Admitting: Ophthalmology

## 2017-09-03 ENCOUNTER — Encounter: Payer: Self-pay | Admitting: Family Medicine

## 2017-09-03 DIAGNOSIS — E113592 Type 2 diabetes mellitus with proliferative diabetic retinopathy without macular edema, left eye: Secondary | ICD-10-CM

## 2017-09-03 DIAGNOSIS — E11319 Type 2 diabetes mellitus with unspecified diabetic retinopathy without macular edema: Secondary | ICD-10-CM

## 2017-09-03 DIAGNOSIS — H43813 Vitreous degeneration, bilateral: Secondary | ICD-10-CM | POA: Diagnosis not present

## 2017-09-03 DIAGNOSIS — E113391 Type 2 diabetes mellitus with moderate nonproliferative diabetic retinopathy without macular edema, right eye: Secondary | ICD-10-CM

## 2017-09-09 DIAGNOSIS — Z86010 Personal history of colonic polyps: Secondary | ICD-10-CM | POA: Diagnosis not present

## 2017-09-09 DIAGNOSIS — K7460 Unspecified cirrhosis of liver: Secondary | ICD-10-CM | POA: Diagnosis not present

## 2017-09-09 DIAGNOSIS — K219 Gastro-esophageal reflux disease without esophagitis: Secondary | ICD-10-CM | POA: Diagnosis not present

## 2017-09-24 DIAGNOSIS — K7460 Unspecified cirrhosis of liver: Secondary | ICD-10-CM | POA: Diagnosis not present

## 2017-09-24 DIAGNOSIS — E039 Hypothyroidism, unspecified: Secondary | ICD-10-CM | POA: Diagnosis not present

## 2017-09-24 DIAGNOSIS — N189 Chronic kidney disease, unspecified: Secondary | ICD-10-CM | POA: Diagnosis not present

## 2017-09-24 DIAGNOSIS — E11319 Type 2 diabetes mellitus with unspecified diabetic retinopathy without macular edema: Secondary | ICD-10-CM | POA: Diagnosis not present

## 2017-09-24 DIAGNOSIS — E559 Vitamin D deficiency, unspecified: Secondary | ICD-10-CM | POA: Diagnosis not present

## 2017-10-16 ENCOUNTER — Other Ambulatory Visit: Payer: Self-pay

## 2017-10-16 ENCOUNTER — Encounter (HOSPITAL_COMMUNITY): Payer: Self-pay | Admitting: *Deleted

## 2017-10-16 ENCOUNTER — Emergency Department (HOSPITAL_COMMUNITY)
Admission: EM | Admit: 2017-10-16 | Discharge: 2017-10-16 | Disposition: A | Discharge status: Home / Self Care | Payer: Medicare Other | Attending: Emergency Medicine | Admitting: Emergency Medicine

## 2017-10-16 DIAGNOSIS — E162 Hypoglycemia, unspecified: Secondary | ICD-10-CM | POA: Diagnosis present

## 2017-10-16 DIAGNOSIS — Z794 Long term (current) use of insulin: Secondary | ICD-10-CM | POA: Insufficient documentation

## 2017-10-16 DIAGNOSIS — E039 Hypothyroidism, unspecified: Secondary | ICD-10-CM | POA: Diagnosis not present

## 2017-10-16 DIAGNOSIS — Z79899 Other long term (current) drug therapy: Secondary | ICD-10-CM | POA: Diagnosis not present

## 2017-10-16 DIAGNOSIS — R0682 Tachypnea, not elsewhere classified: Secondary | ICD-10-CM | POA: Diagnosis not present

## 2017-10-16 DIAGNOSIS — E11649 Type 2 diabetes mellitus with hypoglycemia without coma: Secondary | ICD-10-CM | POA: Diagnosis not present

## 2017-10-16 DIAGNOSIS — I129 Hypertensive chronic kidney disease with stage 1 through stage 4 chronic kidney disease, or unspecified chronic kidney disease: Secondary | ICD-10-CM | POA: Insufficient documentation

## 2017-10-16 DIAGNOSIS — E161 Other hypoglycemia: Secondary | ICD-10-CM | POA: Diagnosis not present

## 2017-10-16 DIAGNOSIS — N183 Chronic kidney disease, stage 3 (moderate): Secondary | ICD-10-CM | POA: Diagnosis not present

## 2017-10-16 LAB — URINALYSIS, ROUTINE W REFLEX MICROSCOPIC
Bacteria, UA: NONE SEEN
Bilirubin Urine: NEGATIVE
Glucose, UA: NEGATIVE mg/dL
Ketones, ur: NEGATIVE mg/dL
Leukocytes, UA: NEGATIVE
Nitrite: NEGATIVE
PH: 5 (ref 5.0–8.0)
Protein, ur: NEGATIVE mg/dL
SPECIFIC GRAVITY, URINE: 1.012 (ref 1.005–1.030)

## 2017-10-16 LAB — CBC WITH DIFFERENTIAL/PLATELET
BASOS PCT: 0 %
Basophils Absolute: 0 10*3/uL (ref 0.0–0.1)
EOS ABS: 0 10*3/uL (ref 0.0–0.7)
EOS PCT: 1 %
HCT: 37.4 % (ref 36.0–46.0)
HEMOGLOBIN: 12.1 g/dL (ref 12.0–15.0)
Lymphocytes Relative: 6 %
Lymphs Abs: 0.4 10*3/uL — ABNORMAL LOW (ref 0.7–4.0)
MCH: 30.6 pg (ref 26.0–34.0)
MCHC: 32.4 g/dL (ref 30.0–36.0)
MCV: 94.7 fL (ref 78.0–100.0)
MONO ABS: 0.1 10*3/uL (ref 0.1–1.0)
MONOS PCT: 1 %
NEUTROS PCT: 92 %
Neutro Abs: 7 10*3/uL (ref 1.7–7.7)
Platelets: 73 10*3/uL — ABNORMAL LOW (ref 150–400)
RBC: 3.95 MIL/uL (ref 3.87–5.11)
RDW: 14.4 % (ref 11.5–15.5)
WBC: 7.5 10*3/uL (ref 4.0–10.5)

## 2017-10-16 LAB — COMPREHENSIVE METABOLIC PANEL
ALBUMIN: 3.4 g/dL — AB (ref 3.5–5.0)
ALK PHOS: 127 U/L — AB (ref 38–126)
ALT: 29 U/L (ref 0–44)
AST: 33 U/L (ref 15–41)
Anion gap: 7 (ref 5–15)
BILIRUBIN TOTAL: 0.8 mg/dL (ref 0.3–1.2)
BUN: 35 mg/dL — ABNORMAL HIGH (ref 8–23)
CALCIUM: 8.6 mg/dL — AB (ref 8.9–10.3)
CO2: 22 mmol/L (ref 22–32)
CREATININE: 1.64 mg/dL — AB (ref 0.44–1.00)
Chloride: 106 mmol/L (ref 98–111)
GFR, EST AFRICAN AMERICAN: 37 mL/min — AB (ref >60)
GFR, EST NON AFRICAN AMERICAN: 32 mL/min — AB (ref >60)
Glucose, Bld: 95 mg/dL (ref 70–99)
Potassium: 3.4 mmol/L — ABNORMAL LOW (ref 3.5–5.1)
Sodium: 135 mmol/L (ref 135–145)
TOTAL PROTEIN: 6.8 g/dL (ref 6.5–8.1)

## 2017-10-16 LAB — CBG MONITORING, ED
Glucose-Capillary: 82 mg/dL (ref 70–99)
Glucose-Capillary: 166 mg/dL — ABNORMAL HIGH (ref 70–99)
Glucose-Capillary: 181 mg/dL — ABNORMAL HIGH (ref 70–99)

## 2017-10-16 NOTE — ED Notes (Signed)
Patient given gram crackers and soda to eat.

## 2017-10-16 NOTE — ED Triage Notes (Signed)
Pt brought in by [facility] for c/o cbg of 58 at home; pt was given 2mg  of glucagon by [facility]; pt states she ate supper around 2000 and took her night time insulin; pt states around midnight she was feeling bad and she took her cbg and it was in the 200's

## 2017-10-16 NOTE — ED Notes (Signed)
CBG finger stick not crossing over. Finger stick was 166

## 2017-10-16 NOTE — ED Notes (Signed)
Patient stated that she called her son and her son is going to pick her up. Patient currently waiting on transportation.

## 2017-10-16 NOTE — ED Notes (Signed)
CBG finger stick done and blood glucose was 84.

## 2017-10-16 NOTE — ED Provider Notes (Signed)
[facility] EMERGENCY DEPARTMENT Provider Note   CSN: [id number] Arrival date & time: 10/16/17  0153  Time seen 02:10 AM   History   Chief Complaint Chief Complaint  Patient presents with  . Hypoglycemia    HPI [name] is a 67 y.o. female.  HPI patient states about midnight she started feeling bad.  She states that she ate a light supper at 8 PM which is the usual time.  She checked her blood sugar at suppertime and it was 256.  At 1 AM it was 115.  She states she called EMS because she started feeling cold, shivering, had some nausea and states her face was red and felt hot.  On EMS arrival her blood sugar was 58.  She was given glucagon and when she arrived in the ED her blood sugar was 84.  She states she is feeling better now.  She states she has been having some low blood sugars over the past month.  PCP Celene Squibb, MD first visit in September Endocrinologist Dr. Chalmers Cater appointment in August Nephrologist Dr. Posey Pronto GI Dr. Collene Mares   Past Medical History:  Diagnosis Date  . Alkaline phosphatase elevation   . Anemia   . ASD (atrial septal defect)   . Cataract    both eyes hx of  . Chest pain    03-17-2013 last chest pain  . Chronic kidney disease    Stage III kidney disease  . Cirrhosis ([place])   . Depression   . Diabetes mellitus type II   . Fatigue   . GERD (gastroesophageal reflux disease)   . Gout   . Headache(784.0)    occasional  . Hyperlipidemia   . Hypothyroidism   . Neuropathy   . Peripheral neuropathy   . Retinopathy     Patient Active Problem List   Diagnosis Date Noted  . Thrombocytopenia ([place]) 07/30/2017  . CKD stage 3 due to type 2 diabetes mellitus ([place]) 06/30/2017  . Liver cirrhosis secondary to NASH ([place]) 06/30/2017  . Hypertension with renal disease 03/19/2017  . 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/19/2017  . Tremor 08/06/2016  . Vitreous hemorrhage of left eye ([place]) 01/29/2015  . Diabetic macular edema ([place]) 12/25/2014    . Hypertensive retinopathy of both eyes 12/25/2014  . Cholecystitis 04/05/2013  . Proliferative diabetic retinopathy ([place]) 08/04/2011  . Bilateral nondiabetic proliferative retinopathy 04/03/2011  . Type 2 diabetes mellitus with diabetic chronic kidney disease ([place]) 02/05/2010  . CHEST PAIN 02/05/2010  . Hypothyroidism 03/20/2008  . HYPERLIPIDEMIA 03/20/2008  . RENAL DISEASE, CHRONIC, STAGE II 03/20/2008  . DEPRESSION 02/21/2008  . PERIPHERAL NEUROPATHY 02/21/2008  . Pseudophakia of both eyes 02/21/2008  . GERD 02/21/2008    Past Surgical History:  Procedure Laterality Date  . CATARACT EXTRACTION    . CERVICAL SPINE SURGERY  2006  . CHOLECYSTECTOMY N/A 04/07/2013   Procedure: LAPAROSCOPIC CHOLECYSTECTOMY WITH INTRAOPERATIVE CHOLANGIOGRAM;  Surgeon: Merrie Roof, MD;  Location: [facility];  Service: General;  Laterality: N/A;  . COMBINED HYSTERECTOMY VAGINAL / OOPHORECTOMY / A&P REPAIR  1987   Unilateral oophorectomy, h/o uterine prolapse has right ovary  . ERCP N/A 04/06/2013   Procedure: ENDOSCOPIC RETROGRADE CHOLANGIOPANCREATOGRAPHY (ERCP);  Surgeon: Beryle Beams, MD;  Location: [facility];  Service: Endoscopy;  Laterality: N/A;  . EUS N/A 03/31/2013   Procedure: UPPER ENDOSCOPIC ULTRASOUND (EUS) LINEAR;  Surgeon: Beryle Beams, MD;  Location: [facility];  Service: Endoscopy;  Laterality: N/A;  .  REFRACTIVE SURGERY    . SPINAL FUSION    . TONSILLECTOMY  age 55     OB History   None      Home Medications    Prior to Admission medications   Medication Sig Start Date End Date Taking? Authorizing Provider  allopurinol (ZYLOPRIM) 100 MG tablet Take 100 mg by mouth every evening.     [provider]  hydrOXYzine (ATARAX/VISTARIL) 25 MG tablet Take 25 mg by mouth 3 (three) times daily as needed for anxiety or itching (Helps with neuropathy).     [provider]  Insulin NPH Human, Isophane, (NOVOLIN N [place]) Inject 50 Units into the skin 2 (two) times daily.     [provider]  insulin regular (NOVOLIN R,HUMULIN R) 100 units/mL injection Inject 5-20 Units into the skin 3 (three) times daily before meals.    [provider]  levothyroxine (SYNTHROID, LEVOTHROID) 150 MCG tablet Take 150 mcg by mouth daily before breakfast.    [provider]  metroNIDAZOLE (METROGEL) 1 % gel Apply topically daily. 06/10/17   Caren Macadam, MD  ranitidine (ZANTAC) 300 MG tablet Take 300 mg by mouth at bedtime.    [provider]  terconazole (TERAZOL 7) 0.4 % vaginal cream Place 1 applicator vaginally at bedtime. 06/30/17   Caren Macadam, MD  traMADol (ULTRAM) 50 MG tablet Take 1 tablet (50 mg total) by mouth every 6 (six) hours as needed. 05/23/17   McDonald, Laymond Purser, PA-C    Family History Family History  Problem Relation Age of Onset  . COPD Mother   . Lung cancer Father   . Diabetes Sister   . Cataracts Sister   . Insulin resistance Daughter   . Insulin resistance Son     Social History Social History   Tobacco Use  . Smoking status: Never Smoker  . Smokeless tobacco: Never Used  Substance Use Topics  . Alcohol use: No  . Drug use: No  lives alone   Allergies   Statins and Dilaudid [hydromorphone hcl]   Review of Systems Review of Systems  All other systems reviewed and are negative.    Physical Exam Updated Vital Signs BP (!) 125/45 (BP Location: Left Arm)   Pulse 92   Temp 98.1 F (36.7 C) (Oral)   Resp 16   Ht 5\' 6"  (1.676 m)   Wt 90.7 kg (200 lb)   SpO2 99%   BMI 32.28 kg/m   Physical Exam  Constitutional: She is oriented to person, place, and time. She appears well-developed and well-nourished.  Non-toxic appearance. She does not appear ill. No distress.  HENT:  Head: Normocephalic and atraumatic.  Right Ear: External ear normal.  Left Ear: External ear normal.  Nose: Nose normal. No mucosal edema or rhinorrhea.  Mouth/Throat: Mucous membranes are dry. No dental abscesses or uvula swelling.    Eyes: Pupils are equal, round, and reactive to light. Conjunctivae and EOM are normal.  Neck: Normal range of motion and full passive range of motion without pain. Neck supple.  Cardiovascular: Normal rate, regular rhythm and normal heart sounds. Exam reveals no gallop and no friction rub.  No murmur heard. Pulmonary/Chest: Effort normal and breath sounds normal. No respiratory distress. She has no wheezes. She has no rhonchi. She has no rales. She exhibits no tenderness and no crepitus.  Abdominal: Soft. Normal appearance and bowel sounds are normal. She exhibits no distension. There is no tenderness. There is no rebound and no guarding.  Musculoskeletal: Normal range of motion. She exhibits no edema or tenderness.  Moves all extremities well.   Neurological: She is alert and oriented to person, place, and time. She has normal strength. No cranial nerve deficit.  Skin: Skin is warm, dry and intact. No rash noted. No erythema. No pallor.  Psychiatric: She has a normal mood and affect. Her speech is normal and behavior is normal. Her mood appears not anxious.  Nursing note and vitals reviewed.    ED Treatments / Results  Labs (all labs ordered are listed, but only abnormal results are displayed) Results for orders placed or performed during the hospital encounter of 10/16/17  Comprehensive metabolic panel  Result Value Ref Range   Sodium 135 135 - 145 mmol/L   Potassium 3.4 (L) 3.5 - 5.1 mmol/L   Chloride 106 98 - 111 mmol/L   CO2 22 22 - 32 mmol/L   Glucose, Bld 95 70 - 99 mg/dL   BUN 35 (H) 8 - 23 mg/dL   Creatinine, Ser 1.64 (H) 0.44 - 1.00 mg/dL   Calcium 8.6 (L) 8.9 - 10.3 mg/dL   Total Protein 6.8 6.5 - 8.1 g/dL   Albumin 3.4 (L) 3.5 - 5.0 g/dL   AST 33 15 - 41 U/L   ALT 29 0 - 44 U/L   Alkaline Phosphatase 127 (H) 38 - 126 U/L   Total Bilirubin 0.8 0.3 - 1.2 mg/dL   GFR calc non Af Amer 32 (L) >60 mL/min   GFR calc Af Amer 37 (L) >60 mL/min   Anion gap 7 5 - 15  CBC with  Differential  Result Value Ref Range   WBC 7.5 4.0 - 10.5 K/uL   RBC 3.95 3.87 - 5.11 MIL/uL   Hemoglobin 12.1 12.0 - 15.0 g/dL   HCT 37.4 36.0 - 46.0 %   MCV 94.7 78.0 - 100.0 fL   MCH 30.6 26.0 - 34.0 pg   MCHC 32.4 30.0 - 36.0 g/dL   RDW 14.4 11.5 - 15.5 %   Platelets 73 (L) 150 - 400 K/uL   Neutrophils Relative % 92 %   Neutro Abs 7.0 1.7 - 7.7 K/uL   Lymphocytes Relative 6 %   Lymphs Abs 0.4 (L) 0.7 - 4.0 K/uL   Monocytes Relative 1 %   Monocytes Absolute 0.1 0.1 - 1.0 K/uL   Eosinophils Relative 1 %   Eosinophils Absolute 0.0 0.0 - 0.7 K/uL   Basophils Relative 0 %   Basophils Absolute 0.0 0.0 - 0.1 K/uL  Urinalysis, Routine w reflex microscopic  Result Value Ref Range   Color, Urine YELLOW YELLOW   APPearance HAZY (A) CLEAR   Specific Gravity, Urine 1.012 1.005 - 1.030   pH 5.0 5.0 - 8.0   Glucose, UA NEGATIVE NEGATIVE mg/dL   Hgb urine dipstick SMALL (A) NEGATIVE   Bilirubin Urine NEGATIVE NEGATIVE   Ketones, ur NEGATIVE NEGATIVE mg/dL   Protein, ur NEGATIVE NEGATIVE mg/dL   Nitrite NEGATIVE NEGATIVE   Leukocytes, UA NEGATIVE NEGATIVE   RBC / HPF 0-5 0 - 5 RBC/hpf   WBC, UA 0-5 0 - 5 WBC/hpf   Bacteria, UA NONE SEEN NONE SEEN   Squamous Epithelial / LPF 0-5 0 - 5   Laboratory interpretation all normal except minimal hypokalemia, renal insufficiency    EKG None  Radiology No results found.  Procedures Procedures (including critical care time)  Medications Ordered in ED Medications - No data to display   Initial Impression / Assessment  and Plan / ED Course  I have reviewed the triage vital signs and the nursing notes.  Pertinent labs & imaging results that were available during my care of the patient were reviewed by me and considered in my medical decision making (see chart for details).    Patient was given something to eat, her glucose was rechecked.  3:50 AM CBG was 166  5:06 AM CBG was 181.  At this point I felt patient had maintained her  glucose over 100 for several hours in the ED.  She was discharged home.  We talked about if she is eating less she might need to take a little less insulin that evening.  She should keep a close eye on her blood sugar for the next several days.  She should keep her first appointment with Dr. Nevada Crane that she has scheduled.  She should also contact her endocrinologist, Dr. Chalmers Cater on Monday, July 22 to let her know about her episodes of hypoglycemia.  Final Clinical Impressions(s) / ED Diagnoses   Final diagnoses:  Hypoglycemia    ED Discharge Orders    None      Plan discharge  Rolland Porter, MD, Barbette Or, MD 10/16/17 [phone]

## 2017-10-16 NOTE — Discharge Instructions (Addendum)
Please monitor your blood sugar very closely over the next couple days.  If you eat less for dinner you should consider taking a little less insulin.  Please call Dr. Almetta Lovely office on Monday, July 22 to let her know about your episodes of hypoglycemia.  She may want to adjust your medications. Return to the ED if you have any further problems.

## 2017-10-16 NOTE — ED Notes (Signed)
CBG Fingerstick 181. Results will not cross over in chart.

## 2017-11-16 DIAGNOSIS — E039 Hypothyroidism, unspecified: Secondary | ICD-10-CM | POA: Diagnosis not present

## 2017-11-16 DIAGNOSIS — E7800 Pure hypercholesterolemia, unspecified: Secondary | ICD-10-CM | POA: Diagnosis not present

## 2017-11-16 DIAGNOSIS — E1165 Type 2 diabetes mellitus with hyperglycemia: Secondary | ICD-10-CM | POA: Diagnosis not present

## 2017-11-23 DIAGNOSIS — I129 Hypertensive chronic kidney disease with stage 1 through stage 4 chronic kidney disease, or unspecified chronic kidney disease: Secondary | ICD-10-CM | POA: Diagnosis not present

## 2017-11-23 DIAGNOSIS — E1165 Type 2 diabetes mellitus with hyperglycemia: Secondary | ICD-10-CM | POA: Diagnosis not present

## 2017-11-23 DIAGNOSIS — E039 Hypothyroidism, unspecified: Secondary | ICD-10-CM | POA: Diagnosis not present

## 2017-11-23 DIAGNOSIS — E7800 Pure hypercholesterolemia, unspecified: Secondary | ICD-10-CM | POA: Diagnosis not present

## 2017-12-02 DIAGNOSIS — E782 Mixed hyperlipidemia: Secondary | ICD-10-CM | POA: Diagnosis not present

## 2017-12-02 DIAGNOSIS — E11319 Type 2 diabetes mellitus with unspecified diabetic retinopathy without macular edema: Secondary | ICD-10-CM | POA: Diagnosis not present

## 2017-12-02 DIAGNOSIS — E559 Vitamin D deficiency, unspecified: Secondary | ICD-10-CM | POA: Diagnosis not present

## 2017-12-02 DIAGNOSIS — N189 Chronic kidney disease, unspecified: Secondary | ICD-10-CM | POA: Diagnosis not present

## 2017-12-02 DIAGNOSIS — E039 Hypothyroidism, unspecified: Secondary | ICD-10-CM | POA: Diagnosis not present

## 2017-12-02 DIAGNOSIS — Z0000 Encounter for general adult medical examination without abnormal findings: Secondary | ICD-10-CM | POA: Diagnosis not present

## 2017-12-09 DIAGNOSIS — E11319 Type 2 diabetes mellitus with unspecified diabetic retinopathy without macular edema: Secondary | ICD-10-CM | POA: Diagnosis not present

## 2017-12-09 DIAGNOSIS — N189 Chronic kidney disease, unspecified: Secondary | ICD-10-CM | POA: Diagnosis not present

## 2017-12-09 DIAGNOSIS — Z0001 Encounter for general adult medical examination with abnormal findings: Secondary | ICD-10-CM | POA: Diagnosis not present

## 2017-12-09 DIAGNOSIS — E039 Hypothyroidism, unspecified: Secondary | ICD-10-CM | POA: Diagnosis not present

## 2017-12-09 DIAGNOSIS — K7460 Unspecified cirrhosis of liver: Secondary | ICD-10-CM | POA: Diagnosis not present

## 2017-12-28 ENCOUNTER — Inpatient Hospital Stay (HOSPITAL_COMMUNITY): Payer: Medicare Other | Attending: Hematology

## 2017-12-28 DIAGNOSIS — D696 Thrombocytopenia, unspecified: Secondary | ICD-10-CM | POA: Diagnosis not present

## 2017-12-28 DIAGNOSIS — R161 Splenomegaly, not elsewhere classified: Secondary | ICD-10-CM | POA: Diagnosis not present

## 2017-12-28 DIAGNOSIS — M8580 Other specified disorders of bone density and structure, unspecified site: Secondary | ICD-10-CM | POA: Insufficient documentation

## 2017-12-28 DIAGNOSIS — K7581 Nonalcoholic steatohepatitis (NASH): Secondary | ICD-10-CM | POA: Insufficient documentation

## 2017-12-28 DIAGNOSIS — K7460 Unspecified cirrhosis of liver: Secondary | ICD-10-CM | POA: Diagnosis not present

## 2017-12-28 LAB — CBC WITH DIFFERENTIAL/PLATELET
BASOS ABS: 0 10*3/uL (ref 0.0–0.1)
BASOS PCT: 0 %
EOS PCT: 3 %
Eosinophils Absolute: 0.2 10*3/uL (ref 0.0–0.7)
HCT: 35.2 % — ABNORMAL LOW (ref 36.0–46.0)
HEMOGLOBIN: 11.1 g/dL — AB (ref 12.0–15.0)
LYMPHS ABS: 1.2 10*3/uL (ref 0.7–4.0)
Lymphocytes Relative: 17 %
MCH: 30.2 pg (ref 26.0–34.0)
MCHC: 31.5 g/dL (ref 30.0–36.0)
MCV: 95.9 fL (ref 78.0–100.0)
Monocytes Absolute: 0.5 10*3/uL (ref 0.1–1.0)
Monocytes Relative: 8 %
NEUTROS PCT: 72 %
Neutro Abs: 5.1 10*3/uL (ref 1.7–7.7)
PLATELETS: 68 10*3/uL — AB (ref 150–400)
RBC: 3.67 MIL/uL — AB (ref 3.87–5.11)
RDW: 14.7 % (ref 11.5–15.5)
WBC: 7.1 10*3/uL (ref 4.0–10.5)

## 2017-12-30 DIAGNOSIS — R05 Cough: Secondary | ICD-10-CM | POA: Diagnosis not present

## 2017-12-30 DIAGNOSIS — J019 Acute sinusitis, unspecified: Secondary | ICD-10-CM | POA: Diagnosis not present

## 2018-01-04 ENCOUNTER — Encounter (HOSPITAL_COMMUNITY): Payer: Self-pay | Admitting: Hematology

## 2018-01-04 ENCOUNTER — Inpatient Hospital Stay (HOSPITAL_BASED_OUTPATIENT_CLINIC_OR_DEPARTMENT_OTHER): Payer: Medicare Other | Admitting: Hematology

## 2018-01-04 VITALS — BP 133/52 | HR 70 | Temp 98.0°F | Resp 18 | Wt 195.0 lb

## 2018-01-04 DIAGNOSIS — R161 Splenomegaly, not elsewhere classified: Secondary | ICD-10-CM

## 2018-01-04 DIAGNOSIS — D696 Thrombocytopenia, unspecified: Secondary | ICD-10-CM

## 2018-01-04 DIAGNOSIS — M8580 Other specified disorders of bone density and structure, unspecified site: Secondary | ICD-10-CM

## 2018-01-04 DIAGNOSIS — K7581 Nonalcoholic steatohepatitis (NASH): Secondary | ICD-10-CM

## 2018-01-04 DIAGNOSIS — K7460 Unspecified cirrhosis of liver: Secondary | ICD-10-CM | POA: Diagnosis not present

## 2018-01-04 NOTE — Assessment & Plan Note (Signed)
1.  Moderate thrombocytopenia: - Her CBC on 07/06/2017 shows a platelet count of 66.  She has mild thrombocytopenia, mostly above 100 since 2015.  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 - Differential diagnosis includes thrombocytopenia secondary to splenomegaly, immune mediated thrombocytopenia and possible MDS. -Nutritional deficiency work-up was negative.  ANA was 1:80 speckled pattern positive, although weakly.  She does not have any clinical signs or symptoms of lupus.  We considered it is false-positive. -She did not have any active bleeding.  She just came back from a 10-day cruise trip to [place].  She did have upper respiratory infection and finished 5-day course of azithromycin today.  I discussed the results of the blood work which showed platelet count of 68.  This was done from 85 last visit. - She will come back in 3 months for follow-up.  We will plan to repeat her blood work at that time.  If the platelet count gets below 50 we will consider work-up including bone marrow aspiration and biopsy.  2.  Osteopenia: -Her last DEXA scan was on 07/08/2017 with T score of -1.4 in the left femoral neck.  I have recommended calcium and vitamin D supplements and weightbearing exercises.  We plan to repeat DEXA scan in 1 to 2 years.

## 2018-01-04 NOTE — Patient Instructions (Signed)
[facility] Discharge Instructions     Thank you for choosing [facility] at [facility] to provide your oncology and hematology care.  To afford each patient quality time with our provider, please arrive at least 15 minutes before your scheduled appointment time.   If you have a lab appointment with the Cancer Center please come in thru the  Main Entrance and check in at the main information desk  You need to re-schedule your appointment should you arrive 10 or more minutes late.  We strive to give you quality time with our providers, and arriving late affects you and other patients whose appointments are after yours.  Also, if you no show three or more times for appointments you may be dismissed from the clinic at the providers discretion.     Again, thank you for choosing [facility].  Our hope is that these requests will decrease the amount of time that you wait before being seen by our physicians.       _____________________________________________________________  Should you have questions after your visit to [facility], please contact our office at [phone] between the hours of 8:00 a.m. and 4:30 p.m.  Voicemails left after 4:00 p.m. will not be returned until the following business day.  For prescription refill requests, have your pharmacy contact our office and allow 72 hours.    Cancer Center Support Programs:   > Cancer Support Group  2nd Tuesday of the month 1pm-2pm, Journey Room    

## 2018-01-04 NOTE — Progress Notes (Signed)
[place] [address] [place], [place] [address]   CLINIC:  Medical Oncology/Hematology  PCP:  Celene Squibb, MD [place] [address] [phone]   REASON FOR VISIT: Follow-up for thrombocytopenia  CURRENT THERAPY: Observation  INTERVAL HISTORY:  [name] 67 y.o. female returns for routine follow-up for thrombocytopenia. She is doing well she is getting over an upper respiratory infection. She has been taking a course of antibiotics over the past 5 days. She reports easy bruising. Patient lives alone and performs all her own ADLs. She recently returned from a 10 day cruise. She denies any headaches. Denies any new pains. She reports her appetite and energy level at 75%.  She is maintaining her weight at this time.     REVIEW OF SYSTEMS:  Review of Systems  All other systems reviewed and are negative.    PAST MEDICAL/SURGICAL HISTORY:  Past Medical History:  Diagnosis Date  . Alkaline phosphatase elevation   . Anemia   . ASD (atrial septal defect)   . Cataract    both eyes hx of  . Chest pain    03-17-2013 last chest pain  . Chronic kidney disease    Stage III kidney disease  . Cirrhosis ([place])   . Depression   . Diabetes mellitus type II   . Fatigue   . GERD (gastroesophageal reflux disease)   . Gout   . Headache(784.0)    occasional  . Hyperlipidemia   . Hypothyroidism   . Neuropathy   . Peripheral neuropathy   . Retinopathy    Past Surgical History:  Procedure Laterality Date  . CATARACT EXTRACTION    . CERVICAL SPINE SURGERY  2006  . CHOLECYSTECTOMY N/A 04/07/2013   Procedure: LAPAROSCOPIC CHOLECYSTECTOMY WITH INTRAOPERATIVE CHOLANGIOGRAM;  Surgeon: Merrie Roof, MD;  Location: [facility];  Service: General;  Laterality: N/A;  . COMBINED HYSTERECTOMY VAGINAL / OOPHORECTOMY / A&P REPAIR  1987   Unilateral oophorectomy, h/o uterine prolapse has right ovary  . ERCP N/A 04/06/2013   Procedure: ENDOSCOPIC RETROGRADE  CHOLANGIOPANCREATOGRAPHY (ERCP);  Surgeon: Beryle Beams, MD;  Location: [facility];  Service: Endoscopy;  Laterality: N/A;  . EUS N/A 03/31/2013   Procedure: UPPER ENDOSCOPIC ULTRASOUND (EUS) LINEAR;  Surgeon: Beryle Beams, MD;  [facility]: [facility];  Service: Endoscopy;  Laterality: N/A;  . REFRACTIVE SURGERY    . SPINAL FUSION    . TONSILLECTOMY  age 20     SOCIAL HISTORY:  Social History   Socioeconomic History  . Marital status: Divorced    Spouse name: Not on file  . Number of children: 3  . Years of education: 57  . Highest education level: Not on file  Occupational History  . 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 Financial resource strain: Not on file  . Food insecurity:    Worry: Not on file    Inability: Not on file  . Transportation needs:    Medical: Not on file    Non-medical: Not on file  Tobacco Use  . Smoking status: Never Smoker  . Smokeless tobacco: Never Used  Substance and Sexual Activity  . Alcohol use: No  . Drug use: No  . Sexual activity: Not Currently  Lifestyle  . Physical activity:    Days per week: Not on file    Minutes per session: Not on file  . Stress: Not on file  Relationships  .  Social connections:    Talks on phone: Not on file    Gets together: Not on file    Attends religious service: Not on file    Active member of club or organization: Not on file    Attends meetings of clubs or organizations: Not on file    Relationship status: Not on file  . Intimate partner violence:    Fear of current or ex partner: Not on file    Emotionally abused: Not on file    Physically abused: Not on file    Forced sexual activity: Not on file  Other Topics Concern  . Not on file  Social History Narrative   Divorced   Lives alone. Reports that her son recently moved out after living with her for a long time. She reports that she is happy to be living alone and feels like she was enabling his behavior.  Reports that he had a history of drug use.   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    No pets.   Eats all food groups.    Wears seat belt.    Lives in house.    Smoke detectors.     FAMILY HISTORY:  Family History  Problem Relation Age of Onset  . COPD Mother   . Lung cancer Father   . Diabetes Sister   . Cataracts Sister   . Insulin resistance Daughter   . Insulin resistance Son     CURRENT MEDICATIONS:  Outpatient Encounter Medications as of 01/04/2018  Medication Sig Note  . allopurinol (ZYLOPRIM) 100 MG tablet Take 100 mg by mouth every evening.    . hydrOXYzine (ATARAX/VISTARIL) 25 MG tablet Take 25 mg by mouth 3 (three) times daily as needed for anxiety or itching (Helps with neuropathy).    . Insulin NPH Human, Isophane, (NOVOLIN N [place]) Inject 50 Units into the skin 2 (two) times daily. 01/04/2018: 20 units  . insulin regular (NOVOLIN R,HUMULIN R) 100 units/mL injection Inject 5-20 Units into the skin 3 (three) times daily before meals.   [name] levothyroxine (SYNTHROID, LEVOTHROID) 150 MCG tablet Take 150 mcg by mouth daily before breakfast.   . metroNIDAZOLE (METROGEL) 1 % gel Apply topically daily.   . ranitidine (ZANTAC) 300 MG tablet Take 300 mg by mouth at bedtime.   [name] terconazole (TERAZOL 7) 0.4 % vaginal cream Place 1 applicator vaginally at bedtime.   . traMADol (ULTRAM) 50 MG tablet Take 1 tablet (50 mg total) by mouth every 6 (six) hours as needed.    No facility-administered encounter medications on file as of 01/04/2018.     ALLERGIES:  Allergies  Allergen Reactions  . Statins Other (See Comments)    REACTION: diarrhea/joint pain/muscle aches. Tried at low doses.   . Dilaudid [Hydromorphone Hcl] Nausea And Vomiting    After one vomiting episode no further vomiting     PHYSICAL EXAM:  ECOG Performance status: 1  Vitals:   01/04/18 1500  BP: (!) 133/52  Pulse: 70  Resp: 18  Temp: 98 F (36.7  C)  SpO2: 100%   Filed Weights   01/04/18 1500  Weight: 195 lb (88.5 kg)    Physical Exam  Constitutional: She is oriented to person, place, and time. She appears well-developed and well-nourished.  Abdominal: Soft.  Musculoskeletal: Normal range of motion.  Neurological: She is alert and oriented to person, place, and time.  Skin: Skin is warm and dry.  Psychiatric: She has a normal mood and affect. Her behavior is normal. Judgment and thought content normal.   Abdomen is soft, nontender with no palpable hepatospleno megaly.  LABORATORY DATA:  I have reviewed the labs as listed.  CBC    Component Value Date/Time   WBC 7.1 12/28/2017 1302   RBC 3.67 (L) 12/28/2017 1302   HGB 11.1 (L) 12/28/2017 1302   HCT 35.2 (L) 12/28/2017 1302   PLT 68 (L) 12/28/2017 1302   MCV 95.9 12/28/2017 1302   MCH 30.2 12/28/2017 1302   MCHC 31.5 12/28/2017 1302   RDW 14.7 12/28/2017 1302   LYMPHSABS 1.2 12/28/2017 1302   MONOABS 0.5 12/28/2017 1302   EOSABS 0.2 12/28/2017 1302   BASOSABS 0.0 12/28/2017 1302   CMP Latest Ref Rng & Units 10/16/2017 03/03/2016 12/02/2015  Glucose 70 - 99 mg/dL 95 125(H) 88  BUN 8 - 23 mg/dL 35(H) 25(H) 29(H)  Creatinine 0.44 - 1.00 mg/dL 1.64(H) 1.76(H) 1.61(H)  Sodium 135 - 145 mmol/L 135 138 138  Potassium 3.5 - 5.1 mmol/L 3.4(L) 3.8 3.3(L)  Chloride 98 - 111 mmol/L 106 110 105  CO2 22 - 32 mmol/L 22 19(L) 22  Calcium 8.9 - 10.3 mg/dL 8.6(L) 8.7(L) 8.9  Total Protein 6.5 - 8.1 g/dL 6.8 - 7.1  Total Bilirubin 0.3 - 1.2 mg/dL 0.8 - 0.7  Alkaline Phos 38 - 126 U/L 127(H) - 146(H)  AST 15 - 41 U/L 33 - 37  ALT 0 - 44 U/L 29 - 23         ASSESSMENT & PLAN:   Thrombocytopenia (HCC) 1.  Moderate thrombocytopenia: - Her CBC on 07/06/2017 shows a platelet count of 66.  She has mild thrombocytopenia, mostly above 100 since 2015.  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 - Differential diagnosis includes thrombocytopenia secondary  to splenomegaly, immune mediated thrombocytopenia and possible MDS. -Nutritional deficiency work-up was negative.  ANA was 1:80 speckled pattern positive, although weakly.  She does not have any clinical signs or symptoms of lupus.  We considered it is false-positive. -She did not have any active bleeding.  She just came back from a 10-day cruise trip to [place].  She did have upper respiratory infection and finished 5-day course of azithromycin today.  I discussed the results of the blood work which showed platelet count of 68.  This was done from 85 last visit. - She will come back in 3 months for follow-up.  We will plan to repeat her blood work at that time.  If the platelet count gets below 50 we will consider work-up including bone marrow aspiration and biopsy.  2.  Osteopenia: -Her last DEXA scan was on 07/08/2017 with T score of -1.4 in the left femoral neck.  I have recommended calcium and vitamin D supplements and weightbearing exercises.  We plan to repeat DEXA scan in 1 to 2 years.       Orders placed this encounter:  Orders Placed This Encounter  Procedures  . CBC with Differential/Platelet  . Comprehensive metabolic panel      Derek Jack, MD [place] [phone]

## 2018-01-18 DIAGNOSIS — N183 Chronic kidney disease, stage 3 (moderate): Secondary | ICD-10-CM | POA: Diagnosis not present

## 2018-01-25 DIAGNOSIS — E889 Metabolic disorder, unspecified: Secondary | ICD-10-CM | POA: Diagnosis not present

## 2018-01-25 DIAGNOSIS — I129 Hypertensive chronic kidney disease with stage 1 through stage 4 chronic kidney disease, or unspecified chronic kidney disease: Secondary | ICD-10-CM | POA: Diagnosis not present

## 2018-01-25 DIAGNOSIS — N183 Chronic kidney disease, stage 3 (moderate): Secondary | ICD-10-CM | POA: Diagnosis not present

## 2018-01-25 DIAGNOSIS — Z23 Encounter for immunization: Secondary | ICD-10-CM | POA: Diagnosis not present

## 2018-01-25 DIAGNOSIS — D631 Anemia in chronic kidney disease: Secondary | ICD-10-CM | POA: Diagnosis not present

## 2018-03-11 ENCOUNTER — Encounter (INDEPENDENT_AMBULATORY_CARE_PROVIDER_SITE_OTHER): Payer: Medicare Other | Admitting: Ophthalmology

## 2018-03-17 DIAGNOSIS — K7460 Unspecified cirrhosis of liver: Secondary | ICD-10-CM | POA: Diagnosis not present

## 2018-03-17 DIAGNOSIS — D696 Thrombocytopenia, unspecified: Secondary | ICD-10-CM | POA: Diagnosis not present

## 2018-03-17 DIAGNOSIS — K766 Portal hypertension: Secondary | ICD-10-CM | POA: Diagnosis not present

## 2018-04-07 ENCOUNTER — Other Ambulatory Visit (HOSPITAL_COMMUNITY): Payer: Medicare Other

## 2018-04-12 ENCOUNTER — Encounter (INDEPENDENT_AMBULATORY_CARE_PROVIDER_SITE_OTHER): Payer: Medicare Other | Admitting: Ophthalmology

## 2018-04-12 DIAGNOSIS — E11319 Type 2 diabetes mellitus with unspecified diabetic retinopathy without macular edema: Secondary | ICD-10-CM

## 2018-04-12 DIAGNOSIS — E113592 Type 2 diabetes mellitus with proliferative diabetic retinopathy without macular edema, left eye: Secondary | ICD-10-CM | POA: Diagnosis not present

## 2018-04-12 DIAGNOSIS — H43813 Vitreous degeneration, bilateral: Secondary | ICD-10-CM

## 2018-04-12 DIAGNOSIS — E113591 Type 2 diabetes mellitus with proliferative diabetic retinopathy without macular edema, right eye: Secondary | ICD-10-CM

## 2018-04-14 ENCOUNTER — Other Ambulatory Visit (HOSPITAL_COMMUNITY): Payer: Medicare Other

## 2018-04-14 ENCOUNTER — Ambulatory Visit (HOSPITAL_COMMUNITY): Payer: Medicare Other | Admitting: Hematology

## 2018-05-18 ENCOUNTER — Other Ambulatory Visit (HOSPITAL_COMMUNITY): Payer: Self-pay | Admitting: Internal Medicine

## 2018-05-18 DIAGNOSIS — Z1231 Encounter for screening mammogram for malignant neoplasm of breast: Secondary | ICD-10-CM

## 2018-05-19 DIAGNOSIS — E7800 Pure hypercholesterolemia, unspecified: Secondary | ICD-10-CM | POA: Diagnosis not present

## 2018-05-19 DIAGNOSIS — E039 Hypothyroidism, unspecified: Secondary | ICD-10-CM | POA: Diagnosis not present

## 2018-05-19 DIAGNOSIS — E1165 Type 2 diabetes mellitus with hyperglycemia: Secondary | ICD-10-CM | POA: Diagnosis not present

## 2018-05-26 DIAGNOSIS — I129 Hypertensive chronic kidney disease with stage 1 through stage 4 chronic kidney disease, or unspecified chronic kidney disease: Secondary | ICD-10-CM | POA: Diagnosis not present

## 2018-05-26 DIAGNOSIS — E1165 Type 2 diabetes mellitus with hyperglycemia: Secondary | ICD-10-CM | POA: Diagnosis not present

## 2018-05-26 DIAGNOSIS — E7800 Pure hypercholesterolemia, unspecified: Secondary | ICD-10-CM | POA: Diagnosis not present

## 2018-05-26 DIAGNOSIS — E039 Hypothyroidism, unspecified: Secondary | ICD-10-CM | POA: Diagnosis not present

## 2018-06-01 ENCOUNTER — Ambulatory Visit (HOSPITAL_COMMUNITY)
Admission: RE | Admit: 2018-06-01 | Discharge: 2018-06-01 | Disposition: A | Discharge status: Home / Self Care | Payer: Medicare Other | Source: Ambulatory Visit | Attending: Internal Medicine | Admitting: Internal Medicine

## 2018-06-01 DIAGNOSIS — Z1231 Encounter for screening mammogram for malignant neoplasm of breast: Secondary | ICD-10-CM | POA: Diagnosis not present

## 2018-07-26 DIAGNOSIS — N183 Chronic kidney disease, stage 3 (moderate): Secondary | ICD-10-CM | POA: Diagnosis not present

## 2018-08-01 DIAGNOSIS — E889 Metabolic disorder, unspecified: Secondary | ICD-10-CM | POA: Diagnosis not present

## 2018-08-01 DIAGNOSIS — I129 Hypertensive chronic kidney disease with stage 1 through stage 4 chronic kidney disease, or unspecified chronic kidney disease: Secondary | ICD-10-CM | POA: Diagnosis not present

## 2018-08-01 DIAGNOSIS — N183 Chronic kidney disease, stage 3 (moderate): Secondary | ICD-10-CM | POA: Diagnosis not present

## 2018-08-01 DIAGNOSIS — D631 Anemia in chronic kidney disease: Secondary | ICD-10-CM | POA: Diagnosis not present

## 2018-08-01 DIAGNOSIS — M9080 Osteopathy in diseases classified elsewhere, unspecified site: Secondary | ICD-10-CM | POA: Diagnosis not present

## 2018-08-04 DIAGNOSIS — Z0000 Encounter for general adult medical examination without abnormal findings: Secondary | ICD-10-CM | POA: Diagnosis not present

## 2018-08-29 DIAGNOSIS — R439 Unspecified disturbances of smell and taste: Secondary | ICD-10-CM | POA: Diagnosis not present

## 2018-08-29 DIAGNOSIS — B379 Candidiasis, unspecified: Secondary | ICD-10-CM | POA: Diagnosis not present

## 2018-09-15 DIAGNOSIS — K766 Portal hypertension: Secondary | ICD-10-CM | POA: Diagnosis not present

## 2018-09-15 DIAGNOSIS — Z86010 Personal history of colonic polyps: Secondary | ICD-10-CM | POA: Diagnosis not present

## 2018-09-15 DIAGNOSIS — K7581 Nonalcoholic steatohepatitis (NASH): Secondary | ICD-10-CM | POA: Diagnosis not present

## 2018-09-15 DIAGNOSIS — K7460 Unspecified cirrhosis of liver: Secondary | ICD-10-CM | POA: Diagnosis not present

## 2018-10-11 ENCOUNTER — Encounter (INDEPENDENT_AMBULATORY_CARE_PROVIDER_SITE_OTHER): Payer: Medicare Other | Admitting: Ophthalmology

## 2018-10-12 ENCOUNTER — Encounter (INDEPENDENT_AMBULATORY_CARE_PROVIDER_SITE_OTHER): Payer: Medicare Other | Admitting: Ophthalmology

## 2018-10-12 ENCOUNTER — Other Ambulatory Visit: Payer: Self-pay

## 2018-10-12 DIAGNOSIS — E113391 Type 2 diabetes mellitus with moderate nonproliferative diabetic retinopathy without macular edema, right eye: Secondary | ICD-10-CM | POA: Diagnosis not present

## 2018-10-12 DIAGNOSIS — H35033 Hypertensive retinopathy, bilateral: Secondary | ICD-10-CM | POA: Diagnosis not present

## 2018-10-12 DIAGNOSIS — E113592 Type 2 diabetes mellitus with proliferative diabetic retinopathy without macular edema, left eye: Secondary | ICD-10-CM | POA: Diagnosis not present

## 2018-10-12 DIAGNOSIS — I10 Essential (primary) hypertension: Secondary | ICD-10-CM | POA: Diagnosis not present

## 2018-10-12 DIAGNOSIS — E11319 Type 2 diabetes mellitus with unspecified diabetic retinopathy without macular edema: Secondary | ICD-10-CM

## 2018-10-12 DIAGNOSIS — H43813 Vitreous degeneration, bilateral: Secondary | ICD-10-CM

## 2018-11-17 DIAGNOSIS — E039 Hypothyroidism, unspecified: Secondary | ICD-10-CM | POA: Diagnosis not present

## 2018-11-17 DIAGNOSIS — E1165 Type 2 diabetes mellitus with hyperglycemia: Secondary | ICD-10-CM | POA: Diagnosis not present

## 2018-11-17 DIAGNOSIS — E7800 Pure hypercholesterolemia, unspecified: Secondary | ICD-10-CM | POA: Diagnosis not present

## 2018-11-24 DIAGNOSIS — E7800 Pure hypercholesterolemia, unspecified: Secondary | ICD-10-CM | POA: Diagnosis not present

## 2018-11-24 DIAGNOSIS — I129 Hypertensive chronic kidney disease with stage 1 through stage 4 chronic kidney disease, or unspecified chronic kidney disease: Secondary | ICD-10-CM | POA: Diagnosis not present

## 2018-11-24 DIAGNOSIS — E039 Hypothyroidism, unspecified: Secondary | ICD-10-CM | POA: Diagnosis not present

## 2018-11-24 DIAGNOSIS — N183 Chronic kidney disease, stage 3 (moderate): Secondary | ICD-10-CM | POA: Diagnosis not present

## 2018-11-24 DIAGNOSIS — E1165 Type 2 diabetes mellitus with hyperglycemia: Secondary | ICD-10-CM | POA: Diagnosis not present

## 2018-12-09 DIAGNOSIS — H04123 Dry eye syndrome of bilateral lacrimal glands: Secondary | ICD-10-CM | POA: Diagnosis not present

## 2018-12-09 DIAGNOSIS — E113592 Type 2 diabetes mellitus with proliferative diabetic retinopathy without macular edema, left eye: Secondary | ICD-10-CM | POA: Diagnosis not present

## 2018-12-09 DIAGNOSIS — E113491 Type 2 diabetes mellitus with severe nonproliferative diabetic retinopathy without macular edema, right eye: Secondary | ICD-10-CM | POA: Diagnosis not present

## 2018-12-26 DIAGNOSIS — M79601 Pain in right arm: Secondary | ICD-10-CM | POA: Diagnosis not present

## 2019-01-24 DIAGNOSIS — E039 Hypothyroidism, unspecified: Secondary | ICD-10-CM | POA: Diagnosis not present

## 2019-01-24 DIAGNOSIS — E11319 Type 2 diabetes mellitus with unspecified diabetic retinopathy without macular edema: Secondary | ICD-10-CM | POA: Diagnosis not present

## 2019-01-24 DIAGNOSIS — E782 Mixed hyperlipidemia: Secondary | ICD-10-CM | POA: Diagnosis not present

## 2019-01-24 DIAGNOSIS — E559 Vitamin D deficiency, unspecified: Secondary | ICD-10-CM | POA: Diagnosis not present

## 2019-01-26 DIAGNOSIS — J019 Acute sinusitis, unspecified: Secondary | ICD-10-CM | POA: Diagnosis not present

## 2019-01-26 DIAGNOSIS — K7460 Unspecified cirrhosis of liver: Secondary | ICD-10-CM | POA: Diagnosis not present

## 2019-01-26 DIAGNOSIS — Z0000 Encounter for general adult medical examination without abnormal findings: Secondary | ICD-10-CM | POA: Diagnosis not present

## 2019-01-26 DIAGNOSIS — D696 Thrombocytopenia, unspecified: Secondary | ICD-10-CM | POA: Diagnosis not present

## 2019-01-26 DIAGNOSIS — E11319 Type 2 diabetes mellitus with unspecified diabetic retinopathy without macular edema: Secondary | ICD-10-CM | POA: Diagnosis not present

## 2019-01-31 ENCOUNTER — Other Ambulatory Visit (HOSPITAL_COMMUNITY): Payer: Self-pay | Admitting: *Deleted

## 2019-01-31 DIAGNOSIS — D696 Thrombocytopenia, unspecified: Secondary | ICD-10-CM

## 2019-02-01 ENCOUNTER — Inpatient Hospital Stay (HOSPITAL_COMMUNITY): Payer: Medicare Other | Attending: Hematology

## 2019-02-01 ENCOUNTER — Other Ambulatory Visit: Payer: Self-pay

## 2019-02-01 DIAGNOSIS — N189 Chronic kidney disease, unspecified: Secondary | ICD-10-CM | POA: Diagnosis not present

## 2019-02-01 DIAGNOSIS — K7460 Unspecified cirrhosis of liver: Secondary | ICD-10-CM | POA: Insufficient documentation

## 2019-02-01 DIAGNOSIS — E039 Hypothyroidism, unspecified: Secondary | ICD-10-CM | POA: Insufficient documentation

## 2019-02-01 DIAGNOSIS — M8580 Other specified disorders of bone density and structure, unspecified site: Secondary | ICD-10-CM | POA: Insufficient documentation

## 2019-02-01 DIAGNOSIS — D696 Thrombocytopenia, unspecified: Secondary | ICD-10-CM | POA: Diagnosis not present

## 2019-02-01 DIAGNOSIS — E1122 Type 2 diabetes mellitus with diabetic chronic kidney disease: Secondary | ICD-10-CM | POA: Insufficient documentation

## 2019-02-01 LAB — COMPREHENSIVE METABOLIC PANEL
ALT: 20 U/L (ref 0–44)
AST: 32 U/L (ref 15–41)
Albumin: 3.3 g/dL — ABNORMAL LOW (ref 3.5–5.0)
Alkaline Phosphatase: 150 U/L — ABNORMAL HIGH (ref 38–126)
Anion gap: 9 (ref 5–15)
BUN: 22 mg/dL (ref 8–23)
CO2: 23 mmol/L (ref 22–32)
Calcium: 8.5 mg/dL — ABNORMAL LOW (ref 8.9–10.3)
Chloride: 109 mmol/L (ref 98–111)
Creatinine, Ser: 1.16 mg/dL — ABNORMAL HIGH (ref 0.44–1.00)
GFR calc Af Amer: 56 mL/min — ABNORMAL LOW (ref >60)
GFR calc non Af Amer: 49 mL/min — ABNORMAL LOW (ref >60)
Glucose, Bld: 130 mg/dL — ABNORMAL HIGH (ref 70–99)
Potassium: 4.4 mmol/L (ref 3.5–5.1)
Sodium: 141 mmol/L (ref 135–145)
Total Bilirubin: 1.2 mg/dL (ref 0.3–1.2)
Total Protein: 6.4 g/dL — ABNORMAL LOW (ref 6.5–8.1)

## 2019-02-01 LAB — CBC WITH DIFFERENTIAL/PLATELET
Abs Immature Granulocytes: 0.01 10*3/uL (ref 0.00–0.07)
Basophils Absolute: 0 10*3/uL (ref 0.0–0.1)
Basophils Relative: 0 %
Eosinophils Absolute: 0.2 10*3/uL (ref 0.0–0.5)
Eosinophils Relative: 5 %
HCT: 36.7 % (ref 36.0–46.0)
Hemoglobin: 11.5 g/dL — ABNORMAL LOW (ref 12.0–15.0)
Immature Granulocytes: 0 %
Lymphocytes Relative: 24 %
Lymphs Abs: 1 10*3/uL (ref 0.7–4.0)
MCH: 30.6 pg (ref 26.0–34.0)
MCHC: 31.3 g/dL (ref 30.0–36.0)
MCV: 97.6 fL (ref 80.0–100.0)
Monocytes Absolute: 0.3 10*3/uL (ref 0.1–1.0)
Monocytes Relative: 8 %
Neutro Abs: 2.6 10*3/uL (ref 1.7–7.7)
Neutrophils Relative %: 63 %
Platelets: 58 10*3/uL — ABNORMAL LOW (ref 150–400)
RBC: 3.76 MIL/uL — ABNORMAL LOW (ref 3.87–5.11)
RDW: 15.4 % (ref 11.5–15.5)
WBC: 4.1 10*3/uL (ref 4.0–10.5)
nRBC: 0 % (ref 0.0–0.2)

## 2019-02-08 ENCOUNTER — Encounter (HOSPITAL_COMMUNITY): Payer: Self-pay | Admitting: Hematology

## 2019-02-08 ENCOUNTER — Inpatient Hospital Stay (HOSPITAL_BASED_OUTPATIENT_CLINIC_OR_DEPARTMENT_OTHER): Payer: Medicare Other | Admitting: Hematology

## 2019-02-08 ENCOUNTER — Other Ambulatory Visit: Payer: Self-pay

## 2019-02-08 DIAGNOSIS — M8580 Other specified disorders of bone density and structure, unspecified site: Secondary | ICD-10-CM | POA: Diagnosis not present

## 2019-02-08 DIAGNOSIS — E1122 Type 2 diabetes mellitus with diabetic chronic kidney disease: Secondary | ICD-10-CM | POA: Diagnosis not present

## 2019-02-08 DIAGNOSIS — K7460 Unspecified cirrhosis of liver: Secondary | ICD-10-CM | POA: Diagnosis not present

## 2019-02-08 DIAGNOSIS — D696 Thrombocytopenia, unspecified: Secondary | ICD-10-CM

## 2019-02-08 DIAGNOSIS — E039 Hypothyroidism, unspecified: Secondary | ICD-10-CM | POA: Diagnosis not present

## 2019-02-08 DIAGNOSIS — N189 Chronic kidney disease, unspecified: Secondary | ICD-10-CM | POA: Diagnosis not present

## 2019-03-16 DIAGNOSIS — K5904 Chronic idiopathic constipation: Secondary | ICD-10-CM | POA: Diagnosis not present

## 2019-03-16 DIAGNOSIS — K7460 Unspecified cirrhosis of liver: Secondary | ICD-10-CM | POA: Diagnosis not present

## 2019-03-16 DIAGNOSIS — R140 Abdominal distension (gaseous): Secondary | ICD-10-CM | POA: Diagnosis not present

## 2019-03-16 DIAGNOSIS — D509 Iron deficiency anemia, unspecified: Secondary | ICD-10-CM | POA: Diagnosis not present

## 2019-03-16 NOTE — Progress Notes (Signed)
[place], [place] [address]   CLINIC:  Medical Oncology/Hematology  PCP:  Celene Squibb, MD [place] [address] [phone]   REASON FOR VISIT:  Follow-up for Thrombocytopenia   CURRENT THERAPY: Clinical surveillance    INTERVAL HISTORY:  Ms. Wiltsie 68 y.o. female is a for follow-up.  She reports overall doing well.  She denies any significant fatigue.  She denies any obvious signs of bleeding.  She denies any chest pain, shortness of breath, headache and dizziness.  No change in bowel habits.  Appetite is stable.  No weight loss.  Denies any fevers, chills, night sweats.  She is here for repeat labs and office visit.   REVIEW OF SYSTEMS:  Review of Systems  Musculoskeletal: Positive for arthralgias and myalgias.  All other systems reviewed and are negative.    PAST MEDICAL/SURGICAL HISTORY:  Past Medical History:  Diagnosis Date   Alkaline phosphatase elevation    Anemia    ASD (atrial septal defect)    Cataract    both eyes hx of   Chest pain    03-17-2013 last chest pain   Chronic kidney disease    Stage III kidney disease   Cirrhosis ([place])    Depression    Diabetes mellitus type II    Fatigue    GERD (gastroesophageal reflux disease)    Gout    Headache(784.0)    occasional   Hyperlipidemia    Hypothyroidism    Neuropathy    Peripheral neuropathy    Retinopathy    Past Surgical History:  Procedure Laterality Date   CATARACT EXTRACTION     CERVICAL SPINE SURGERY  2006   CHOLECYSTECTOMY N/A 04/07/2013   Procedure: LAPAROSCOPIC CHOLECYSTECTOMY WITH INTRAOPERATIVE CHOLANGIOGRAM;  Surgeon: Merrie Roof, MD;  Location: [facility];  Service: General;  Laterality: N/A;   COMBINED HYSTERECTOMY VAGINAL / OOPHORECTOMY / A&P REPAIR  1987   Unilateral oophorectomy, h/o uterine prolapse has right ovary   ERCP N/A 04/06/2013   Procedure: ENDOSCOPIC RETROGRADE CHOLANGIOPANCREATOGRAPHY  (ERCP);  Surgeon: Beryle Beams, MD;  Location: [facility];  Service: Endoscopy;  Laterality: N/A;   EUS N/A 03/31/2013   Procedure: UPPER ENDOSCOPIC ULTRASOUND (EUS) LINEAR;  Surgeon: Beryle Beams, MD;  Location: [facility];  Service: Endoscopy;  Laterality: N/A;   REFRACTIVE SURGERY     SPINAL FUSION     TONSILLECTOMY  age 47     SOCIAL HISTORY:  Social History   Socioeconomic History   Marital status: Divorced    Spouse name: Not on file   Number of children: 3   Years of education: 12   Highest education level: Not on file  Occupational History   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 Reports that her son recently moved out after living with her for a long time. She reports that she is happy to be living alone and feels like she was enabling his behavior. Reports that he had a history of drug use.   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    No pets.   Eats all food groups.    Wears seat belt.    Lives in house.    Smoke detectors.    Social Determinants of Health   Financial Resource Strain:    Difficulty of Paying Living Expenses: Not on file  Food Insecurity:    Worried About Charity fundraiser in the Last Year: Not on file   [employer] of Food in the Last Year: Not on file  Transportation Needs:    Lack of Transportation (Medical): Not on file   Lack of Transportation (Non-Medical): Not on file  Physical Activity:    Days of Exercise per Week: Not on file   Minutes of Exercise per Session: Not on file  Stress:    Feeling of Stress : Not on file  Social Connections:    Frequency of  Communication with Friends and Family: Not on file   Frequency of Social Gatherings with Friends and Family: Not on file   Attends Religious Services: Not on file   Active Member of Clubs or Organizations: Not on file   Attends Archivist Meetings: Not on file   Marital Status: Not on file  Intimate Partner Violence:    Fear of Current or Ex-Partner: Not on file   Emotionally Abused: Not on file   Physically Abused: Not on file   Sexually Abused: Not on file    FAMILY HISTORY:  Family History  Problem Relation Age of Onset   COPD Mother    Lung cancer Father    Diabetes Sister    Cataracts Sister    Insulin resistance Daughter    Insulin resistance Son     CURRENT MEDICATIONS:  Outpatient Encounter Medications as of 02/08/2019  Medication Sig Note   allopurinol (ZYLOPRIM) 100 MG tablet Take 100 mg by mouth every evening.     Insulin NPH Human, Isophane, (NOVOLIN N [place]) Inject 50 Units into the skin 2 (two) times daily. 01/04/2018: 20 units   insulin regular (NOVOLIN R,HUMULIN R) 100 units/mL injection Inject 5-20 Units into the skin 3 (three) times daily before meals.    levothyroxine (SYNTHROID) 137 MCG tablet Take 137 mcg by mouth daily.    ONETOUCH ULTRA test strip     Vitamin D, Ergocalciferol, (DRISDOL) 1.25 MG (50000 UT) CAPS capsule Take 50,000 Units by mouth once a week.    [DISCONTINUED] levothyroxine (SYNTHROID, LEVOTHROID) 150 MCG tablet Take 150 mcg by mouth daily before breakfast.    hydrOXYzine (ATARAX/VISTARIL) 25 MG tablet Take 25 mg by mouth 3 (three) times daily as needed for anxiety or itching (Helps with neuropathy).     traMADol (ULTRAM) 50 MG tablet Take 1 tablet (50 mg total) by mouth every 6 (six) hours as needed. (Patient not taking: Reported on 02/08/2019)    [DISCONTINUED] metroNIDAZOLE (METROGEL) 1 % gel Apply topically daily. (Patient not taking: Reported on 02/08/2019)    [DISCONTINUED] ranitidine (ZANTAC) 300 MG  tablet Take 300 mg by mouth at bedtime.    [DISCONTINUED] terconazole (TERAZOL 7) 0.4 % vaginal cream Place 1 applicator vaginally at bedtime.    No facility-administered encounter medications on file as of 02/08/2019.    ALLERGIES:  Allergies  Allergen Reactions   Statins Other (See Comments)    REACTION: diarrhea/joint pain/muscle aches. Tried at low doses.    Dilaudid [Hydromorphone Hcl] Nausea And Vomiting    After one vomiting episode no further vomiting  PHYSICAL EXAM:  ECOG Performance status: 1  Vitals:   02/08/19 1408  BP: (!) 132/99  Pulse: 70  Resp: 20  Temp: 97.8 F (36.6 C)  SpO2: 99%   Filed Weights   02/08/19 1408  Weight: 200 lb 11.2 oz (91 kg)    Physical Exam Constitutional:      Appearance: Normal appearance. She is obese.  HENT:     Head: Normocephalic.     Right Ear: External ear normal.     Left Ear: External ear normal.     Nose: Nose normal.     Mouth/Throat:     Pharynx: Oropharynx is clear.  Eyes:     Conjunctiva/sclera: Conjunctivae normal.  Cardiovascular:     Rate and Rhythm: Normal rate and regular rhythm.     Pulses: Normal pulses.     Heart sounds: Normal heart sounds.  Pulmonary:     Effort: Pulmonary effort is normal.     Breath sounds: Normal breath sounds.  Abdominal:     General: Bowel sounds are normal.  Musculoskeletal:        General: Normal range of motion.     Cervical back: Normal range of motion.  Skin:    General: Skin is warm and dry.  Neurological:     General: No focal deficit present.     Mental Status: She is alert and oriented to person, place, and time.  Psychiatric:        Mood and Affect: Mood normal.        Behavior: Behavior normal.      LABORATORY DATA:  I have reviewed the labs as listed.  CBC    Component Value Date/Time   WBC 4.1 02/01/2019 1126   RBC 3.76 (L) 02/01/2019 1126   HGB 11.5 (L) 02/01/2019 1126   HCT 36.7 02/01/2019 1126   PLT 58 (L) 02/01/2019 1126   MCV 97.6  02/01/2019 1126   MCH 30.6 02/01/2019 1126   MCHC 31.3 02/01/2019 1126   RDW 15.4 02/01/2019 1126   LYMPHSABS 1.0 02/01/2019 1126   MONOABS 0.3 02/01/2019 1126   EOSABS 0.2 02/01/2019 1126   BASOSABS 0.0 02/01/2019 1126   CMP Latest Ref Rng & Units 02/01/2019 10/16/2017 03/03/2016  Glucose 70 - 99 mg/dL 130(H) 95 125(H)  BUN 8 - 23 mg/dL 22 35(H) 25(H)  Creatinine 0.44 - 1.00 mg/dL 1.16(H) 1.64(H) 1.76(H)  Sodium 135 - 145 mmol/L 141 135 138  Potassium 3.5 - 5.1 mmol/L 4.4 3.4(L) 3.8  Chloride 98 - 111 mmol/L 109 106 110  CO2 22 - 32 mmol/L 23 22 19(L)  Calcium 8.9 - 10.3 mg/dL 8.5(L) 8.6(L) 8.7(L)  Total Protein 6.5 - 8.1 g/dL 6.4(L) 6.8 -  Total Bilirubin 0.3 - 1.2 mg/dL 1.2 0.8 -  Alkaline Phos 38 - 126 U/L 150(H) 127(H) -  AST 15 - 41 U/L 32 33 -  ALT 0 - 44 U/L 20 29 -       ASSESSMENT & PLAN:   Thrombocytopenia (HCC) 1.  Moderate thrombocytopenia: - Her CBC on 07/06/2017 shows a platelet count of 66.  She has mild thrombocytopenia, mostly above 100 since 2015.  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 - Differential diagnosis includes thrombocytopenia secondary to splenomegaly, immune mediated thrombocytopenia and possible MDS. -Nutritional deficiency work-up was negative.  ANA was 1:80 speckled pattern positive, although weakly.  She does not have any clinical signs or symptoms of lupus.  We considered it is false-positive. -Platelet count  today is stable at 58,000.  She denies any active bleeding no interventions indicated at this time.  We will continue to monitor. -She will return to clinic in 6 months or sooner if needed.  2.  Osteopenia: -Her last DEXA scan was on 07/08/2017 with T score of -1.4 in the left femoral neck.  I have recommended calcium and vitamin D supplements and weightbearing exercises.  We plan to repeat DEXA scan in 1 to 2 years.       [place] [phone]

## 2019-03-16 NOTE — Assessment & Plan Note (Signed)
1.  Moderate thrombocytopenia: - Her CBC on 07/06/2017 shows a platelet count of 66.  She has mild thrombocytopenia, mostly above 100 since 2015.  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 - Differential diagnosis includes thrombocytopenia secondary to splenomegaly, immune mediated thrombocytopenia and possible MDS. -Nutritional deficiency work-up was negative.  ANA was 1:80 speckled pattern positive, although weakly.  She does not have any clinical signs or symptoms of lupus.  We considered it is false-positive. -Platelet count today is stable at 58,000.  She denies any active bleeding no interventions indicated at this time.  We will continue to monitor. -She will return to clinic in 6 months or sooner if needed.  2.  Osteopenia: -Her last DEXA scan was on 07/08/2017 with T score of -1.4 in the left femoral neck.  I have recommended calcium and vitamin D supplements and weightbearing exercises.  We plan to repeat DEXA scan in 1 to 2 years.

## 2019-04-13 DIAGNOSIS — K760 Fatty (change of) liver, not elsewhere classified: Secondary | ICD-10-CM | POA: Diagnosis not present

## 2019-04-13 DIAGNOSIS — K5904 Chronic idiopathic constipation: Secondary | ICD-10-CM | POA: Diagnosis not present

## 2019-04-13 DIAGNOSIS — Z86010 Personal history of colonic polyps: Secondary | ICD-10-CM | POA: Diagnosis not present

## 2019-04-14 ENCOUNTER — Encounter (INDEPENDENT_AMBULATORY_CARE_PROVIDER_SITE_OTHER): Payer: Medicare Other | Admitting: Ophthalmology

## 2019-04-14 DIAGNOSIS — E11319 Type 2 diabetes mellitus with unspecified diabetic retinopathy without macular edema: Secondary | ICD-10-CM

## 2019-04-14 DIAGNOSIS — E113592 Type 2 diabetes mellitus with proliferative diabetic retinopathy without macular edema, left eye: Secondary | ICD-10-CM | POA: Diagnosis not present

## 2019-04-14 DIAGNOSIS — H43813 Vitreous degeneration, bilateral: Secondary | ICD-10-CM | POA: Diagnosis not present

## 2019-04-14 DIAGNOSIS — E113391 Type 2 diabetes mellitus with moderate nonproliferative diabetic retinopathy without macular edema, right eye: Secondary | ICD-10-CM | POA: Diagnosis not present

## 2019-05-22 DIAGNOSIS — L309 Dermatitis, unspecified: Secondary | ICD-10-CM | POA: Diagnosis not present

## 2019-05-25 DIAGNOSIS — E11319 Type 2 diabetes mellitus with unspecified diabetic retinopathy without macular edema: Secondary | ICD-10-CM | POA: Diagnosis not present

## 2019-05-25 DIAGNOSIS — I129 Hypertensive chronic kidney disease with stage 1 through stage 4 chronic kidney disease, or unspecified chronic kidney disease: Secondary | ICD-10-CM | POA: Diagnosis not present

## 2019-05-25 DIAGNOSIS — E039 Hypothyroidism, unspecified: Secondary | ICD-10-CM | POA: Diagnosis not present

## 2019-05-25 DIAGNOSIS — E7800 Pure hypercholesterolemia, unspecified: Secondary | ICD-10-CM | POA: Diagnosis not present

## 2019-05-25 DIAGNOSIS — R7989 Other specified abnormal findings of blood chemistry: Secondary | ICD-10-CM | POA: Diagnosis not present

## 2019-05-25 DIAGNOSIS — E1165 Type 2 diabetes mellitus with hyperglycemia: Secondary | ICD-10-CM | POA: Diagnosis not present

## 2019-06-12 DIAGNOSIS — N189 Chronic kidney disease, unspecified: Secondary | ICD-10-CM | POA: Diagnosis not present

## 2019-06-12 DIAGNOSIS — N1831 Chronic kidney disease, stage 3a: Secondary | ICD-10-CM | POA: Diagnosis not present

## 2019-06-21 DIAGNOSIS — E889 Metabolic disorder, unspecified: Secondary | ICD-10-CM | POA: Diagnosis not present

## 2019-06-21 DIAGNOSIS — N1831 Chronic kidney disease, stage 3a: Secondary | ICD-10-CM | POA: Diagnosis not present

## 2019-06-21 DIAGNOSIS — M9080 Osteopathy in diseases classified elsewhere, unspecified site: Secondary | ICD-10-CM | POA: Diagnosis not present

## 2019-06-21 DIAGNOSIS — D631 Anemia in chronic kidney disease: Secondary | ICD-10-CM | POA: Diagnosis not present

## 2019-06-21 DIAGNOSIS — I129 Hypertensive chronic kidney disease with stage 1 through stage 4 chronic kidney disease, or unspecified chronic kidney disease: Secondary | ICD-10-CM | POA: Diagnosis not present

## 2019-06-29 DIAGNOSIS — D696 Thrombocytopenia, unspecified: Secondary | ICD-10-CM | POA: Diagnosis not present

## 2019-06-29 DIAGNOSIS — B379 Candidiasis, unspecified: Secondary | ICD-10-CM | POA: Diagnosis not present

## 2019-06-29 DIAGNOSIS — E039 Hypothyroidism, unspecified: Secondary | ICD-10-CM | POA: Diagnosis not present

## 2019-06-29 DIAGNOSIS — B029 Zoster without complications: Secondary | ICD-10-CM | POA: Diagnosis not present

## 2019-06-29 DIAGNOSIS — E11319 Type 2 diabetes mellitus with unspecified diabetic retinopathy without macular edema: Secondary | ICD-10-CM | POA: Diagnosis not present

## 2019-07-03 DIAGNOSIS — E559 Vitamin D deficiency, unspecified: Secondary | ICD-10-CM | POA: Diagnosis not present

## 2019-07-03 DIAGNOSIS — Z0001 Encounter for general adult medical examination with abnormal findings: Secondary | ICD-10-CM | POA: Diagnosis not present

## 2019-07-03 DIAGNOSIS — K7460 Unspecified cirrhosis of liver: Secondary | ICD-10-CM | POA: Diagnosis not present

## 2019-07-03 DIAGNOSIS — E11319 Type 2 diabetes mellitus with unspecified diabetic retinopathy without macular edema: Secondary | ICD-10-CM | POA: Diagnosis not present

## 2019-07-03 DIAGNOSIS — E782 Mixed hyperlipidemia: Secondary | ICD-10-CM | POA: Diagnosis not present

## 2019-07-03 DIAGNOSIS — D696 Thrombocytopenia, unspecified: Secondary | ICD-10-CM | POA: Diagnosis not present

## 2019-07-17 DIAGNOSIS — N8111 Cystocele, midline: Secondary | ICD-10-CM | POA: Diagnosis not present

## 2019-07-17 DIAGNOSIS — L309 Dermatitis, unspecified: Secondary | ICD-10-CM | POA: Diagnosis not present

## 2019-07-17 DIAGNOSIS — K5900 Constipation, unspecified: Secondary | ICD-10-CM | POA: Diagnosis not present

## 2019-07-19 ENCOUNTER — Other Ambulatory Visit (HOSPITAL_COMMUNITY): Payer: Self-pay | Admitting: Internal Medicine

## 2019-07-19 DIAGNOSIS — Z1231 Encounter for screening mammogram for malignant neoplasm of breast: Secondary | ICD-10-CM

## 2019-08-11 ENCOUNTER — Other Ambulatory Visit: Payer: Self-pay

## 2019-08-11 ENCOUNTER — Emergency Department (HOSPITAL_COMMUNITY)
Admission: EM | Admit: 2019-08-11 | Discharge: 2019-08-11 | Disposition: A | Discharge status: Home / Self Care | Payer: Medicare Other | Attending: Emergency Medicine | Admitting: Emergency Medicine

## 2019-08-11 ENCOUNTER — Encounter (HOSPITAL_COMMUNITY): Payer: Self-pay | Admitting: *Deleted

## 2019-08-11 DIAGNOSIS — Z79899 Other long term (current) drug therapy: Secondary | ICD-10-CM | POA: Diagnosis not present

## 2019-08-11 DIAGNOSIS — N183 Chronic kidney disease, stage 3 unspecified: Secondary | ICD-10-CM | POA: Insufficient documentation

## 2019-08-11 DIAGNOSIS — Z794 Long term (current) use of insulin: Secondary | ICD-10-CM | POA: Diagnosis not present

## 2019-08-11 DIAGNOSIS — N390 Urinary tract infection, site not specified: Secondary | ICD-10-CM | POA: Insufficient documentation

## 2019-08-11 DIAGNOSIS — I129 Hypertensive chronic kidney disease with stage 1 through stage 4 chronic kidney disease, or unspecified chronic kidney disease: Secondary | ICD-10-CM | POA: Diagnosis not present

## 2019-08-11 DIAGNOSIS — E1122 Type 2 diabetes mellitus with diabetic chronic kidney disease: Secondary | ICD-10-CM | POA: Diagnosis not present

## 2019-08-11 DIAGNOSIS — N8110 Cystocele, unspecified: Secondary | ICD-10-CM

## 2019-08-11 DIAGNOSIS — E039 Hypothyroidism, unspecified: Secondary | ICD-10-CM | POA: Insufficient documentation

## 2019-08-11 DIAGNOSIS — N814 Uterovaginal prolapse, unspecified: Secondary | ICD-10-CM | POA: Diagnosis not present

## 2019-08-11 DIAGNOSIS — R300 Dysuria: Secondary | ICD-10-CM | POA: Diagnosis present

## 2019-08-11 DIAGNOSIS — R399 Unspecified symptoms and signs involving the genitourinary system: Secondary | ICD-10-CM

## 2019-08-11 LAB — URINALYSIS, ROUTINE W REFLEX MICROSCOPIC
Bilirubin Urine: NEGATIVE
Glucose, UA: NEGATIVE mg/dL
Ketones, ur: NEGATIVE mg/dL
Nitrite: NEGATIVE
Protein, ur: NEGATIVE mg/dL
Specific Gravity, Urine: 1.008 (ref 1.005–1.030)
pH: 6 (ref 5.0–8.0)

## 2019-08-11 LAB — CBC WITH DIFFERENTIAL/PLATELET
Abs Immature Granulocytes: 0.01 10*3/uL (ref 0.00–0.07)
Basophils Absolute: 0 10*3/uL (ref 0.0–0.1)
Basophils Relative: 1 %
Eosinophils Absolute: 0.2 10*3/uL (ref 0.0–0.5)
Eosinophils Relative: 5 %
HCT: 33.8 % — ABNORMAL LOW (ref 36.0–46.0)
Hemoglobin: 10.6 g/dL — ABNORMAL LOW (ref 12.0–15.0)
Immature Granulocytes: 0 %
Lymphocytes Relative: 29 %
Lymphs Abs: 1 10*3/uL (ref 0.7–4.0)
MCH: 30.7 pg (ref 26.0–34.0)
MCHC: 31.4 g/dL (ref 30.0–36.0)
MCV: 98 fL (ref 80.0–100.0)
Monocytes Absolute: 0.3 10*3/uL (ref 0.1–1.0)
Monocytes Relative: 8 %
Neutro Abs: 2.1 10*3/uL (ref 1.7–7.7)
Neutrophils Relative %: 57 %
Platelets: 51 10*3/uL — ABNORMAL LOW (ref 150–400)
RBC: 3.45 MIL/uL — ABNORMAL LOW (ref 3.87–5.11)
RDW: 15.9 % — ABNORMAL HIGH (ref 11.5–15.5)
WBC: 3.6 10*3/uL — ABNORMAL LOW (ref 4.0–10.5)
nRBC: 0 % (ref 0.0–0.2)

## 2019-08-11 LAB — COMPREHENSIVE METABOLIC PANEL
ALT: 23 U/L (ref 0–44)
AST: 35 U/L (ref 15–41)
Albumin: 3.1 g/dL — ABNORMAL LOW (ref 3.5–5.0)
Alkaline Phosphatase: 116 U/L (ref 38–126)
Anion gap: 6 (ref 5–15)
BUN: 22 mg/dL (ref 8–23)
CO2: 24 mmol/L (ref 22–32)
Calcium: 8.4 mg/dL — ABNORMAL LOW (ref 8.9–10.3)
Chloride: 106 mmol/L (ref 98–111)
Creatinine, Ser: 0.99 mg/dL (ref 0.44–1.00)
GFR calc Af Amer: >60 mL/min (ref >60)
GFR calc non Af Amer: 59 mL/min — ABNORMAL LOW (ref >60)
Glucose, Bld: 132 mg/dL — ABNORMAL HIGH (ref 70–99)
Potassium: 4 mmol/L (ref 3.5–5.1)
Sodium: 136 mmol/L (ref 135–145)
Total Bilirubin: 1 mg/dL (ref 0.3–1.2)
Total Protein: 6.3 g/dL — ABNORMAL LOW (ref 6.5–8.1)

## 2019-08-11 MED ORDER — CEPHALEXIN 500 MG PO CAPS
500.0000 mg | ORAL_CAPSULE | Freq: Once | ORAL | Status: AC
  Administered 2019-08-11: 500 mg via ORAL
  Filled 2019-08-11: qty 1

## 2019-08-11 MED ORDER — CEPHALEXIN 500 MG PO CAPS: 500.0000 mg | ORAL_CAPSULE | Freq: Three times a day (TID) | ORAL | 0 refills | Status: AC

## 2019-08-11 NOTE — ED Provider Notes (Signed)
[facility] EMERGENCY DEPARTMENT Provider Note   CSN: [id number] Arrival date & time: 08/11/19  1517     History Chief Complaint  Patient presents with  . Abdominal Pain    [name] is a 69 y.o. female with history of CKD, nonalcoholic cirrhosis of the liver, diabetes mellitus, GERD, hyperlipidemia, hypothyroidism presents for evaluation of acute onset, persistent urinary symptoms for 2 months.  She reports she has a history of bladder prolapse after hysterectomy that required surgical fixation in the 80s and she thinks that she is having recurrent prolapse.  She reports that she is able to pass urine but can only do so in small amounts and has urinary frequency.  She denies dysuria or hematuria.  She reports feeling as though her abdomen feels bloated but this has been going on for the last several months.  She denies fevers, nausea, vomiting.  She has been experiencing diarrhea but states this is likely due to the fact that she has been taking laxatives because she thought that she was constipated due to the abdominal bloating.  She has not tried anything for her symptoms.  Her PCP has set up follow-up with urology in early June but she states that she cannot wait any longer.  She does report 40 pound weight loss over the last year but states that this was intentional.  The history is provided by the patient.       Past Medical History:  Diagnosis Date  . Alkaline phosphatase elevation   . Anemia   . ASD (atrial septal defect)   . Cataract    both eyes hx of  . Chest pain    03-17-2013 last chest pain  . Chronic kidney disease    Stage III kidney disease  . Cirrhosis ([place])   . Depression   . Diabetes mellitus type II   . Fatigue   . GERD (gastroesophageal reflux disease)   . Gout   . Headache(784.0)    occasional  . Hyperlipidemia   . Hypothyroidism   . Neuropathy   . Peripheral neuropathy   . Retinopathy     Patient Active Problem List   Diagnosis Date Noted    . Thrombocytopenia ([place]) 07/30/2017  . CKD stage 3 due to type 2 diabetes mellitus ([place]) 06/30/2017  . Liver cirrhosis secondary to NASH ([place]) 06/30/2017  . Hypertension with renal disease 03/19/2017  . 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/19/2017  . Tremor 08/06/2016  . Vitreous hemorrhage of left eye ([place]) 01/29/2015  . Diabetic macular edema ([place]) 12/25/2014  . Hypertensive retinopathy of both eyes 12/25/2014  . Cholecystitis 04/05/2013  . Proliferative diabetic retinopathy ([place]) 08/04/2011  . Bilateral nondiabetic proliferative retinopathy 04/03/2011  . Type 2 diabetes mellitus with diabetic chronic kidney disease ([place]) 02/05/2010  . CHEST PAIN 02/05/2010  . Hypothyroidism 03/20/2008  . HYPERLIPIDEMIA 03/20/2008  . RENAL DISEASE, CHRONIC, STAGE II 03/20/2008  . DEPRESSION 02/21/2008  . PERIPHERAL NEUROPATHY 02/21/2008  . Pseudophakia of both eyes 02/21/2008  . GERD 02/21/2008    Past Surgical History:  Procedure Laterality Date  . CATARACT EXTRACTION    . CERVICAL SPINE SURGERY  2006  . CHOLECYSTECTOMY N/A 04/07/2013   Procedure: LAPAROSCOPIC CHOLECYSTECTOMY WITH INTRAOPERATIVE CHOLANGIOGRAM;  Surgeon: Merrie Roof, MD;  Location: [facility];  Service: General;  Laterality: N/A;  . COMBINED HYSTERECTOMY VAGINAL / OOPHORECTOMY / A&P REPAIR  1987   Unilateral oophorectomy, h/o uterine prolapse has right ovary  . ERCP N/A 04/06/2013  Procedure: ENDOSCOPIC RETROGRADE CHOLANGIOPANCREATOGRAPHY (ERCP);  Surgeon: Beryle Beams, MD;  Location: [facility];  Service: Endoscopy;  Laterality: N/A;  . EUS N/A 03/31/2013   Procedure: UPPER ENDOSCOPIC ULTRASOUND (EUS) LINEAR;  Surgeon: Beryle Beams, MD;  Location: [facility];  Service: Endoscopy;  Laterality: N/A;  . REFRACTIVE SURGERY    . SPINAL FUSION    . TONSILLECTOMY  age 33     OB History   No obstetric history on file.     Family History  Problem Relation Age of Onset  . COPD Mother   . Lung cancer Father    . Diabetes Sister   . Cataracts Sister   . Insulin resistance Daughter   . Insulin resistance Son     Social History   Tobacco Use  . Smoking status: Never Smoker  . Smokeless tobacco: Never Used  Substance Use Topics  . Alcohol use: No  . Drug use: No    Home Medications Prior to Admission medications   Medication Sig Start Date End Date Taking? Authorizing Provider  allopurinol (ZYLOPRIM) 100 MG tablet Take 100 mg by mouth every evening.     [provider]  cephALEXin (KEFLEX) 500 MG capsule Take 1 capsule (500 mg total) by mouth 3 (three) times daily for 5 days. 08/11/19 08/16/19  Rodell Perna A, PA-C  hydrOXYzine (ATARAX/VISTARIL) 25 MG tablet Take 25 mg by mouth 3 (three) times daily as needed for anxiety or itching (Helps with neuropathy).     [provider]  Insulin NPH Human, Isophane, (NOVOLIN N [place]) Inject 50 Units into the skin 2 (two) times daily.    [provider]  insulin regular (NOVOLIN R,HUMULIN R) 100 units/mL injection Inject 5-20 Units into the skin 3 (three) times daily before meals.    [provider]  levothyroxine (SYNTHROID) 137 MCG tablet Take 137 mcg by mouth daily. 12/19/18   [provider]  [facility] ULTRA test strip  12/19/18   [provider]  traMADol (ULTRAM) 50 MG tablet Take 1 tablet (50 mg total) by mouth every 6 (six) hours as needed. Patient not taking: Reported on 02/08/2019 05/23/17   McDonald, Maree Erie A, PA-C  Vitamin D, Ergocalciferol, (DRISDOL) 1.25 MG (50000 UT) CAPS capsule Take 50,000 Units by mouth once a week. 11/16/18   [provider]    Allergies    Statins and Dilaudid [hydromorphone hcl]  Review of Systems   Review of Systems  Constitutional: Negative for fever and unexpected weight change.  Respiratory: Negative for shortness of breath.   Cardiovascular: Negative for chest pain.  Gastrointestinal: Positive for abdominal distention ("bloating") and diarrhea (chronic, due  to taking laxatives). Negative for nausea and vomiting.  Genitourinary: Positive for difficulty urinating and frequency. Negative for dysuria and urgency.  All other systems reviewed and are negative.   Physical Exam Updated Vital Signs BP (!) 142/55   Pulse 80   Temp 98 F (36.7 C) (Oral)   Resp 17   Ht 5' 6"  (1.676 m)   Wt 89.8 kg   SpO2 100%   BMI 31.96 kg/m   Physical Exam Vitals and nursing note reviewed.  Constitutional:      General: She is not in acute distress.    Appearance: She is well-developed. She is obese.     Comments: Resting comfortably in bed  HENT:     Head: Normocephalic and atraumatic.  Eyes:     General:        Right eye:  No discharge.        Left eye: No discharge.     Conjunctiva/sclera: Conjunctivae normal.  Neck:     Vascular: No JVD.     Trachea: No tracheal deviation.  Cardiovascular:     Rate and Rhythm: Normal rate and regular rhythm.  Pulmonary:     Effort: Pulmonary effort is normal.     Breath sounds: Normal breath sounds.  Abdominal:     General: Abdomen is protuberant. Bowel sounds are normal. There is no distension.     Tenderness: There is no abdominal tenderness. There is no right CVA tenderness, left CVA tenderness, guarding or rebound.  Genitourinary:    Comments: Examination performed in the presence of a chaperone.  No masses or lesions to the external genitalia.  The bladder is present past the level of the introitus by approximately 1 to 2 cm.  It reduces somewhat.  The cervix is surgically absent.  No vaginal bleeding noted.  No tenderness. Skin:    General: Skin is warm and dry.     Findings: No erythema.  Neurological:     Mental Status: She is alert.  Psychiatric:        Behavior: Behavior normal.     ED Results / Procedures / Treatments   Labs (all labs ordered are listed, but only abnormal results are displayed) Labs Reviewed  COMPREHENSIVE METABOLIC PANEL - Abnormal; Notable for the following components:       Result Value   Glucose, Bld 132 (*)    Calcium 8.4 (*)    Total Protein 6.3 (*)    Albumin 3.1 (*)    GFR calc non Af Amer 59 (*)    All other components within normal limits  CBC WITH DIFFERENTIAL/PLATELET - Abnormal; Notable for the following components:   WBC 3.6 (*)    RBC 3.45 (*)    Hemoglobin 10.6 (*)    HCT 33.8 (*)    RDW 15.9 (*)    Platelets 51 (*)    All other components within normal limits  URINALYSIS, ROUTINE W REFLEX MICROSCOPIC - Abnormal; Notable for the following components:   APPearance CLOUDY (*)    Hgb urine dipstick SMALL (*)    Leukocytes,Ua MODERATE (*)    Bacteria, UA RARE (*)    All other components within normal limits  URINE CULTURE    EKG None  Radiology No results found.  Procedures Procedures (including critical care time)  Medications Ordered in ED Medications  cephALEXin (KEFLEX) capsule 500 mg (has no administration in time range)    ED Course  I have reviewed the triage vital signs and the nursing notes.  Pertinent labs & imaging results that were available during my care of the patient were reviewed by me and considered in my medical decision making (see chart for details).    MDM Rules/Calculators/A&P                      Patient resenting for evaluation of bladder prolapse.  Symptoms have been ongoing for the last few months.  She is afebrile, vital signs are stable.  She is nontoxic in appearance.  Abdomen is soft and nontender with no rebound or guarding.  Clinically she is well-appearing.  Physical examination reveals stage III bladder prolapse, somewhat reducible on examination.  Lab work reviewed and interpreted by myself shows mild stable anemia, no leukocytosis, no renal insufficiency, no metabolic derangements.  Her UA has some features that suggest UTI  but not a clean-catch, will culture.  Plan to start on a course of Keflex outpatient.  Given reassuring physical examination and lab work I do not feel that she requires  emergent imaging.  Low suspicion of acute surgical abdominal pathology.  She is able to urinate without any intervention in the emergency department.  She will call the urologist first thing Monday morning to schedule close follow-up this coming week.  Discussed strict ED return precautions. Patientt verbalized understanding of and agreement with plan and is safe for discharge home at this time. Discussed with Dr. Roderic Palau who agrees with assessment and plan at this time   Final Clinical Impression(s) / ED Diagnoses Final diagnoses:  Bladder prolapse, female, acquired  Urinary tract infection symptoms    Rx / DC Orders ED Discharge Orders         Ordered    cephALEXin (KEFLEX) 500 MG capsule  3 times daily     08/11/19 2035           Debroah Baller 08/11/19 2041    Milton Ferguson, MD 08/12/19 [phone]

## 2019-08-11 NOTE — ED Notes (Signed)
Awaiting DC paperwork 

## 2019-08-11 NOTE — Discharge Instructions (Signed)
Please take all of your antibiotics until finished!   Take your antibiotics with food.  Common side effects of antibiotics include nausea, vomiting, abdominal discomfort, and diarrhea. You may help offset some of this with probiotics which you can buy or get in yogurt. Do not eat  or take the probiotics until 2 hours after your antibiotic.    Please call the urologist first thing Monday morning and let them know you were seen in the emergency department and that we recommend evaluation as soon as possible, ideally this week.  Return to the emergency department if any concerning signs or symptoms develop such as inability to urinate, fevers, vomiting, severe abdominal pain, worsening prolapse.

## 2019-08-11 NOTE — ED Notes (Signed)
Pt reports she was brought to the [facility] on the way back, but no one said they would need a urine spec

## 2019-08-11 NOTE — ED Triage Notes (Signed)
States she has a history of prolapse of bladder and cannot wait any longer for repair

## 2019-08-11 NOTE — ED Notes (Signed)
Pt reports she has had problems since march   Reports she has an appt with urology 6/15  Has been told her bladder is prolapsed by the PA at Dr Juel Burrow office   "feels like it is falling out" per pt   Here for eval   "something has to be done"

## 2019-08-11 NOTE — ED Notes (Signed)
Here for eval of prolapsed bladder and  ? Referral

## 2019-08-13 LAB — URINE CULTURE: Culture: 10000 — AB

## 2019-08-14 ENCOUNTER — Telehealth: Payer: Self-pay | Admitting: Urology

## 2019-08-14 NOTE — Telephone Encounter (Signed)
Patient called and states she is scheduled in June to see Dr Diona Fanti but she was in ER over the weekend and says "her bladder is about to bust" and she needs to be seen asap. Please advise.

## 2019-08-14 NOTE — Telephone Encounter (Signed)
Dr. Diona Fanti can treat this but only in his [place] office. He does not do any interventions in this office. She will need to call [facility] to be scheduled there.

## 2019-08-15 NOTE — Telephone Encounter (Signed)
I called pt several times on 08/14/19 and got a busy signal. Pt left another messgae at he office overnight and I called her back at 8:18am and got a busy signal.

## 2019-08-16 NOTE — Telephone Encounter (Signed)
Patient was told to call [facility] and press option 6 per [place]. She needs to be scheduled in [facility] for the type of issue she has.

## 2019-08-22 DIAGNOSIS — N130 Hydronephrosis with ureteropelvic junction obstruction: Secondary | ICD-10-CM | POA: Diagnosis not present

## 2019-08-22 DIAGNOSIS — N8111 Cystocele, midline: Secondary | ICD-10-CM | POA: Diagnosis not present

## 2019-08-30 ENCOUNTER — Other Ambulatory Visit: Payer: Self-pay

## 2019-08-30 ENCOUNTER — Ambulatory Visit (HOSPITAL_COMMUNITY)
Admission: RE | Admit: 2019-08-30 | Discharge: 2019-08-30 | Disposition: A | Discharge status: Home / Self Care | Payer: Medicare Other | Source: Ambulatory Visit | Attending: Internal Medicine | Admitting: Internal Medicine

## 2019-08-30 DIAGNOSIS — Z1231 Encounter for screening mammogram for malignant neoplasm of breast: Secondary | ICD-10-CM | POA: Insufficient documentation

## 2019-09-04 ENCOUNTER — Other Ambulatory Visit: Payer: Self-pay

## 2019-09-04 ENCOUNTER — Encounter: Payer: Self-pay | Admitting: Dermatology

## 2019-09-04 ENCOUNTER — Ambulatory Visit: Payer: Medicare Other | Admitting: Dermatology

## 2019-09-04 DIAGNOSIS — L309 Dermatitis, unspecified: Secondary | ICD-10-CM | POA: Diagnosis not present

## 2019-09-04 DIAGNOSIS — D692 Other nonthrombocytopenic purpura: Secondary | ICD-10-CM

## 2019-09-04 NOTE — Patient Instructions (Addendum)
Several issues discussed with [name] today.  She has fragile skin with easy bruising on her forearms.  She may look for a nonprescription cream called Dermend which she can apply to the areas prone to bruising daily; roughly 2/3 of people state that this decreases the bruising significantly.  She has a history of facial and ocular rosacea; her skin was treated in the past with MetroGel with some success.  Most of the current breaking out is more patchy erythema compatible with mild eczema and aggravated by occasional use of the Covid mask.  She will initially try some over-the-counter hydrocortisone ointment on affected areas nightly for 2 weeks.  If this fail she can call me.  She is a good historian and related to history of blisters on her left abdomen for which she was given Valtrex (did not take because of a history of CKD stage III) and clobetasol (six tubes provided but none used).  The blistery rash lasted for 4 to 6 weeks and completely cleared with no residual discomfort.  I cannot tell at this point whether it was a mild case of shingles or possible local allergy, but at this point there is no more to do.  It is uncertain whether she would need to get the shingles Shingrix vaccine.  Finally she pointed out to me on her right shin a little circular rash that was initially felt to be fungus by her kidney doctor.  Examination showed a perfectly normal right foot with no fungus involving the skin, toe webs or nails.  The 2 cm circle on the lower shin showed micropapules but no scale or margination.  I favor this being either nummular eczema or stasis dermatitis (she does have a history of leg swelling).  She may use the clobetasol which she already has nightly on this area for 3 weeks and then I have asked [name] to please call to let me know if the rash looks better.  Otherwise follow-up can be on a as needed basis

## 2019-09-06 DIAGNOSIS — N1330 Unspecified hydronephrosis: Secondary | ICD-10-CM | POA: Diagnosis not present

## 2019-09-06 DIAGNOSIS — N130 Hydronephrosis with ureteropelvic junction obstruction: Secondary | ICD-10-CM | POA: Diagnosis not present

## 2019-09-11 ENCOUNTER — Ambulatory Visit: Payer: Medicare Other | Admitting: Urology

## 2019-09-11 ENCOUNTER — Encounter: Payer: Self-pay | Admitting: Dermatology

## 2019-09-11 NOTE — Progress Notes (Signed)
   New Patient   Subjective  [name] is a 69 y.o. female who presents for the following: Skin Problem (right lower shin-x march- + itch- per kidney dr looks like yeast/fungus tx- selsum blue shampoo).  Rash Location: Left abdomen Duration:  Quality: Blistered Associated Signs/Symptoms: Modifying Factors: No clear Severity:  Timing: Context:    The following portions of the chart were reviewed this encounter and updated as appropriate: Tobacco  Allergies  Meds  Problems  Med Hx  Surg Hx  Fam Hx      Objective  Well appearing patient in no apparent distress; mood and affect are within normal limits.  A focused examination was performed including Head, neck, back, abdomen, arms, legs, nails.. Relevant physical exam findings are noted in the Assessment and Plan.   Assessment & Plan  Senile purpura (HCC) (2) Left Forearm - Posterior; Right Forearm - Posterior  Dermend cream daily after showering for 1 to 33-monthtrial  Dermatitis (2) Right Lower Leg - Anterior; Left Inguinal Area  Mrs. KSmartalready has clobetasol cream which I encouraged her to use a spot on her leg nightly for 1 month at which time I have asked her to call me with a status report.  Several issues discussed with Brandy Henletoday.  She has fragile skin with easy bruising on her forearms.  She may look for a nonprescription cream called Dermend which she can apply to the areas prone to bruising daily; roughly 2/3 of people state that this decreases the bruising significantly.  She has a history of facial and ocular rosacea; her skin was treated in the past with MetroGel with some success.  Most of the current breaking out is more patchy erythema compatible with mild eczema and aggravated by occasional use of the Covid mask.  She will initially try some over-the-counter hydrocortisone ointment on affected areas nightly for 2 weeks.  If this fail she can call me.  She is a good historian and related to  history of blisters on her left abdomen for which she was given Valtrex (did not take because of a history of CKD stage III) and clobetasol (six tubes provided but none used).  The blistery rash lasted for 4 to 6 weeks and completely cleared with no residual discomfort.  I cannot tell at this point whether it was a mild case of shingles or possible local allergy, but at this point there is no more to do.  It is uncertain whether she would need to get the shingles Shingrix vaccine.  Finally she pointed out to me on her right shin a little circular rash that was initially felt to be fungus by her kidney doctor.  Examination showed a perfectly normal right foot with no fungus involving the skin, toe webs or nails.  The 2 cm circle on the lower shin showed micropapules but no scale or margination.  I favor this being either nummular eczema or stasis dermatitis (she does have a history of leg swelling).  She may use the clobetasol which she already has nightly on this area for 3 weeks and then I have asked DSafarito please call to let me know if the rash looks better.  Otherwise follow-up can be on a as needed basis

## 2019-09-12 ENCOUNTER — Ambulatory Visit: Payer: Medicare Other | Admitting: Urology

## 2019-09-15 DIAGNOSIS — N8111 Cystocele, midline: Secondary | ICD-10-CM | POA: Diagnosis not present

## 2019-09-15 DIAGNOSIS — R109 Unspecified abdominal pain: Secondary | ICD-10-CM | POA: Diagnosis not present

## 2019-09-15 DIAGNOSIS — R310 Gross hematuria: Secondary | ICD-10-CM | POA: Diagnosis not present

## 2019-09-22 DIAGNOSIS — N8111 Cystocele, midline: Secondary | ICD-10-CM | POA: Diagnosis not present

## 2019-10-03 DIAGNOSIS — N8111 Cystocele, midline: Secondary | ICD-10-CM | POA: Diagnosis not present

## 2019-10-09 DIAGNOSIS — K5900 Constipation, unspecified: Secondary | ICD-10-CM | POA: Diagnosis not present

## 2019-10-09 DIAGNOSIS — D696 Thrombocytopenia, unspecified: Secondary | ICD-10-CM | POA: Diagnosis not present

## 2019-10-09 DIAGNOSIS — Z0001 Encounter for general adult medical examination with abnormal findings: Secondary | ICD-10-CM | POA: Diagnosis not present

## 2019-10-09 DIAGNOSIS — N8111 Cystocele, midline: Secondary | ICD-10-CM | POA: Diagnosis not present

## 2019-10-09 DIAGNOSIS — L309 Dermatitis, unspecified: Secondary | ICD-10-CM | POA: Diagnosis not present

## 2019-10-09 DIAGNOSIS — R2243 Localized swelling, mass and lump, lower limb, bilateral: Secondary | ICD-10-CM | POA: Diagnosis not present

## 2019-10-09 DIAGNOSIS — R1900 Intra-abdominal and pelvic swelling, mass and lump, unspecified site: Secondary | ICD-10-CM | POA: Diagnosis not present

## 2019-10-09 DIAGNOSIS — R0602 Shortness of breath: Secondary | ICD-10-CM | POA: Diagnosis not present

## 2019-10-12 ENCOUNTER — Encounter (INDEPENDENT_AMBULATORY_CARE_PROVIDER_SITE_OTHER): Payer: Medicare Other | Admitting: Ophthalmology

## 2019-10-12 ENCOUNTER — Other Ambulatory Visit: Payer: Self-pay | Admitting: Gastroenterology

## 2019-10-12 ENCOUNTER — Other Ambulatory Visit: Payer: Self-pay

## 2019-10-12 DIAGNOSIS — E113592 Type 2 diabetes mellitus with proliferative diabetic retinopathy without macular edema, left eye: Secondary | ICD-10-CM | POA: Diagnosis not present

## 2019-10-12 DIAGNOSIS — H43813 Vitreous degeneration, bilateral: Secondary | ICD-10-CM | POA: Diagnosis not present

## 2019-10-12 DIAGNOSIS — K7460 Unspecified cirrhosis of liver: Secondary | ICD-10-CM | POA: Diagnosis not present

## 2019-10-12 DIAGNOSIS — R600 Localized edema: Secondary | ICD-10-CM | POA: Diagnosis not present

## 2019-10-12 DIAGNOSIS — K7581 Nonalcoholic steatohepatitis (NASH): Secondary | ICD-10-CM

## 2019-10-12 DIAGNOSIS — E11319 Type 2 diabetes mellitus with unspecified diabetic retinopathy without macular edema: Secondary | ICD-10-CM | POA: Diagnosis not present

## 2019-10-12 DIAGNOSIS — E113391 Type 2 diabetes mellitus with moderate nonproliferative diabetic retinopathy without macular edema, right eye: Secondary | ICD-10-CM

## 2019-10-12 DIAGNOSIS — Z86010 Personal history of colonic polyps: Secondary | ICD-10-CM | POA: Diagnosis not present

## 2019-10-17 DIAGNOSIS — Z7689 Persons encountering health services in other specified circumstances: Secondary | ICD-10-CM | POA: Diagnosis not present

## 2019-10-19 DIAGNOSIS — N8111 Cystocele, midline: Secondary | ICD-10-CM | POA: Diagnosis not present

## 2019-10-19 DIAGNOSIS — R600 Localized edema: Secondary | ICD-10-CM | POA: Diagnosis not present

## 2019-10-20 ENCOUNTER — Encounter (HOSPITAL_COMMUNITY): Payer: Self-pay

## 2019-10-20 ENCOUNTER — Emergency Department (HOSPITAL_COMMUNITY)
Admission: EM | Admit: 2019-10-20 | Discharge: 2019-10-20 | Disposition: A | Discharge status: Home / Self Care | Payer: Medicare Other | Attending: Emergency Medicine | Admitting: Emergency Medicine

## 2019-10-20 ENCOUNTER — Emergency Department (HOSPITAL_COMMUNITY): Payer: Medicare Other

## 2019-10-20 DIAGNOSIS — Z794 Long term (current) use of insulin: Secondary | ICD-10-CM | POA: Diagnosis not present

## 2019-10-20 DIAGNOSIS — R1084 Generalized abdominal pain: Secondary | ICD-10-CM | POA: Insufficient documentation

## 2019-10-20 DIAGNOSIS — R609 Edema, unspecified: Secondary | ICD-10-CM | POA: Diagnosis not present

## 2019-10-20 DIAGNOSIS — I129 Hypertensive chronic kidney disease with stage 1 through stage 4 chronic kidney disease, or unspecified chronic kidney disease: Secondary | ICD-10-CM | POA: Diagnosis not present

## 2019-10-20 DIAGNOSIS — E039 Hypothyroidism, unspecified: Secondary | ICD-10-CM | POA: Diagnosis not present

## 2019-10-20 DIAGNOSIS — K219 Gastro-esophageal reflux disease without esophagitis: Secondary | ICD-10-CM | POA: Diagnosis not present

## 2019-10-20 DIAGNOSIS — R188 Other ascites: Secondary | ICD-10-CM | POA: Diagnosis not present

## 2019-10-20 DIAGNOSIS — R52 Pain, unspecified: Secondary | ICD-10-CM

## 2019-10-20 DIAGNOSIS — E1122 Type 2 diabetes mellitus with diabetic chronic kidney disease: Secondary | ICD-10-CM | POA: Insufficient documentation

## 2019-10-20 DIAGNOSIS — Z79899 Other long term (current) drug therapy: Secondary | ICD-10-CM | POA: Diagnosis not present

## 2019-10-20 DIAGNOSIS — N183 Chronic kidney disease, stage 3 unspecified: Secondary | ICD-10-CM | POA: Diagnosis not present

## 2019-10-20 DIAGNOSIS — R339 Retention of urine, unspecified: Secondary | ICD-10-CM | POA: Diagnosis not present

## 2019-10-20 DIAGNOSIS — R109 Unspecified abdominal pain: Secondary | ICD-10-CM | POA: Diagnosis not present

## 2019-10-20 DIAGNOSIS — R6889 Other general symptoms and signs: Secondary | ICD-10-CM | POA: Diagnosis not present

## 2019-10-20 DIAGNOSIS — Z743 Need for continuous supervision: Secondary | ICD-10-CM | POA: Diagnosis not present

## 2019-10-20 NOTE — ED Triage Notes (Signed)
Pt is from home and having urinary retention. Went to see urologist on July 1st. Urinated a small amount of urine this monring. Urologist stays she has fluid buildup in her abdomen. Pt also has constipation

## 2019-10-20 NOTE — ED Notes (Signed)
>  768 mL in bladder

## 2019-10-20 NOTE — Discharge Instructions (Signed)
Follow back up with [facility]

## 2019-10-20 NOTE — ED Notes (Signed)
Leg bag applied and instructions given to change and empty bags, demonstration given and pt verbalized understanding

## 2019-10-20 NOTE — ED Provider Notes (Signed)
[facility] EMERGENCY DEPARTMENT Provider Note   CSN: [id number] Arrival date & time: 10/20/19  9675     History Chief Complaint  Patient presents with  . Abdominal Pain    [name] is a 69 y.o. female.  Patient states inability to urinate.  She recently saw a urologist.  She has been having some problems.  Patient has ascites and her GI doctor told her to get an ultrasound of her abdomen  The history is provided by the patient. No language interpreter was used.  Abdominal Pain Pain location:  Generalized Pain quality: aching   Pain radiates to:  Does not radiate Pain severity:  Mild Onset quality:  Sudden Timing:  Constant Chronicity:  New Context: not alcohol use   Relieved by:  Nothing Worsened by:  Nothing Associated symptoms: no chest pain, no cough, no diarrhea, no fatigue and no hematuria        Past Medical History:  Diagnosis Date  . Alkaline phosphatase elevation   . Anemia   . ASD (atrial septal defect)   . Cataract    both eyes hx of  . Chest pain    03-17-2013 last chest pain  . Chronic kidney disease    Stage III kidney disease  . Cirrhosis ([place])   . Depression   . Diabetes mellitus type II   . Fatigue   . GERD (gastroesophageal reflux disease)   . Gout   . Headache(784.0)    occasional  . Hyperlipidemia   . Hypothyroidism   . Neuropathy   . Peripheral neuropathy   . Retinopathy     Patient Active Problem List   Diagnosis Date Noted  . Thrombocytopenia ([place]) 07/30/2017  . CKD stage 3 due to type 2 diabetes mellitus ([place]) 06/30/2017  . Liver cirrhosis secondary to NASH ([place]) 06/30/2017  . Hypertension with renal disease 03/19/2017  . 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/19/2017  . Tremor 08/06/2016  . Vitreous hemorrhage of left eye ([place]) 01/29/2015  . Diabetic macular edema ([place]) 12/25/2014  . Hypertensive retinopathy of both eyes 12/25/2014  . Cholecystitis 04/05/2013  . Proliferative diabetic retinopathy ([place])  08/04/2011  . Bilateral nondiabetic proliferative retinopathy 04/03/2011  . Type 2 diabetes mellitus with diabetic chronic kidney disease ([place]) 02/05/2010  . CHEST PAIN 02/05/2010  . Hypothyroidism 03/20/2008  . HYPERLIPIDEMIA 03/20/2008  . RENAL DISEASE, CHRONIC, STAGE II 03/20/2008  . DEPRESSION 02/21/2008  . PERIPHERAL NEUROPATHY 02/21/2008  . Pseudophakia of both eyes 02/21/2008  . GERD 02/21/2008    Past Surgical History:  Procedure Laterality Date  . CATARACT EXTRACTION    . CERVICAL SPINE SURGERY  2006  . CHOLECYSTECTOMY N/A 04/07/2013   Procedure: LAPAROSCOPIC CHOLECYSTECTOMY WITH INTRAOPERATIVE CHOLANGIOGRAM;  Surgeon: Merrie Roof, MD;  Location: [facility];  Service: General;  Laterality: N/A;  . COMBINED HYSTERECTOMY VAGINAL / OOPHORECTOMY / A&P REPAIR  1987   Unilateral oophorectomy, h/o uterine prolapse has right ovary  . ERCP N/A 04/06/2013   Procedure: ENDOSCOPIC RETROGRADE CHOLANGIOPANCREATOGRAPHY (ERCP);  Surgeon: Beryle Beams, MD;  Location: [facility];  Service: Endoscopy;  Laterality: N/A;  . EUS N/A 03/31/2013   Procedure: UPPER ENDOSCOPIC ULTRASOUND (EUS) LINEAR;  Surgeon: Beryle Beams, MD;  Location: [facility];  Service: Endoscopy;  Laterality: N/A;  . REFRACTIVE SURGERY    . SPINAL FUSION    . TONSILLECTOMY  age 48     OB History   No obstetric history on file.     Family  History  Problem Relation Age of Onset  . COPD Mother   . Lung cancer Father   . Diabetes Sister   . Cataracts Sister   . Insulin resistance Daughter   . Insulin resistance Son     Social History   Tobacco Use  . Smoking status: Never Smoker  . Smokeless tobacco: Never Used  Vaping Use  . Vaping Use: Never used  Substance Use Topics  . Alcohol use: No  . Drug use: No    Home Medications Prior to Admission medications   Medication Sig Start Date End Date Taking? Authorizing Provider  allopurinol (ZYLOPRIM) 100 MG tablet Take 100 mg by mouth every evening.      [provider]  hydrOXYzine (ATARAX/VISTARIL) 25 MG tablet Take 25 mg by mouth 3 (three) times daily as needed for anxiety or itching (Helps with neuropathy).     [provider]  Insulin NPH Human, Isophane, (NOVOLIN N [place]) Inject 50 Units into the skin 2 (two) times daily.    [provider]  insulin regular (NOVOLIN R,HUMULIN R) 100 units/mL injection Inject 5-20 Units into the skin 3 (three) times daily before meals.    [provider]  levothyroxine (SYNTHROID) 137 MCG tablet Take 137 mcg by mouth daily. 12/19/18   [provider]  [facility] ULTRA test strip  12/19/18   [provider]  traMADol (ULTRAM) 50 MG tablet Take 1 tablet (50 mg total) by mouth every 6 (six) hours as needed. 05/23/17   McDonald, Mia A, PA-C  Vitamin D, Ergocalciferol, (DRISDOL) 1.25 MG (50000 UT) CAPS capsule Take 50,000 Units by mouth once a week. 11/16/18   [provider]    Allergies    Statins and Dilaudid [hydromorphone hcl]  Review of Systems   Review of Systems  Constitutional: Negative for appetite change and fatigue.  HENT: Negative for congestion, ear discharge and sinus pressure.   Eyes: Negative for discharge.  Respiratory: Negative for cough.   Cardiovascular: Negative for chest pain.  Gastrointestinal: Positive for abdominal pain. Negative for diarrhea.  Genitourinary: Negative for frequency and hematuria.        urinary retention  Musculoskeletal: Negative for back pain.  Skin: Negative for rash.  Neurological: Negative for seizures and headaches.  Psychiatric/Behavioral: Negative for hallucinations.    Physical Exam Updated Vital Signs BP (!) 149/65 (BP Location: Left Arm)   Pulse 74   Temp 97.6 F (36.4 C) (Oral)   Resp 12   Ht 5' 6"  (1.676 m)   Wt (!) 93 kg   SpO2 100%   BMI 33.09 kg/m   Physical Exam Vitals and nursing note reviewed.  Constitutional:      Appearance: She is well-developed.  HENT:     Head:  Normocephalic.     Mouth/Throat:     Mouth: Mucous membranes are moist.  Eyes:     General: No scleral icterus.    Conjunctiva/sclera: Conjunctivae normal.  Neck:     Thyroid: No thyromegaly.  Cardiovascular:     Rate and Rhythm: Normal rate and regular rhythm.     Heart sounds: No murmur heard.  No friction rub. No gallop.   Pulmonary:     Breath sounds: No stridor. No wheezing or rales.  Chest:     Chest wall: No tenderness.  Abdominal:     General: There is distension.     Tenderness: There is abdominal tenderness. There is no rebound.  Musculoskeletal:  General: Normal range of motion.     Cervical back: Neck supple.  Lymphadenopathy:     Cervical: No cervical adenopathy.  Skin:    Findings: No erythema or rash.  Neurological:     Mental Status: She is oriented to person, place, and time.     Motor: No abnormal muscle tone.     Coordination: Coordination normal.  Psychiatric:        Behavior: Behavior normal.     ED Results / Procedures / Treatments   Labs (all labs ordered are listed, but only abnormal results are displayed) Labs Reviewed - No data to display  EKG None  Radiology US Abdomen Limited RUQ  Result Date: 10/20/2019 CLINICAL DATA:  Follow-up cirrhosis EXAM: ULTRASOUND ABDOMEN LIMITED RIGHT UPPER QUADRANT COMPARISON:  09/06/2019 CT urography FINDINGS: Gallbladder: Surgically removed Common bile duct: Diameter: 4.7 mm. Liver: Shrunken and nodular consistent with the given clinical history of cirrhosis. Surrounding ascites is noted. No focal mass is seen. Portal vein is patent on color Doppler imaging with normal direction of blood flow towards the liver. Other: None. IMPRESSION: Changes of hepatic cirrhosis with associated ascites. The overall appearance is similar to that seen on prior exam. Status post cholecystectomy. Electronically Signed   By: Inez Catalina M.D.   On: 10/20/2019 09:13    Procedures Procedures (including critical care  time)  Medications Ordered in ED Medications - No data to display  ED Course  I have reviewed the triage vital signs and the nursing notes.  Pertinent labs & imaging results that were available during my care of the patient were reviewed by me and considered in my medical decision making (see chart for details).    MDM Rules/Calculators/A&P                         With urinary retention.  Foley placed and patient had 800 cc.  She will follow back up with urology and use a leg bag        This patient presents to the ED for concern of urinary retention, this involves an extensive number of treatment options, and is a complaint that carries with it a high risk of complications and morbidity.  The differential diagnosis includes obstruction   Lab Tests:   Medicines ordered:     Imaging Studies ordered:   I ordered imaging studies which included ultrasound abdomen and  I independently visualized and interpreted imaging which showed ascites  Additional history obtained:   Additional history obtained from records  Previous records obtained and reviewed.  Consultations Obtained:     Reevaluation:  After the interventions stated above, I reevaluated the patient and found improved  Critical Interventions:  .    Final Clinical Impression(s) / ED Diagnoses Final diagnoses:  Pain  Urinary retention    Rx / DC Orders ED Discharge Orders    None       Milton Ferguson, MD 10/20/19 1042

## 2019-10-24 ENCOUNTER — Other Ambulatory Visit: Payer: Medicare Other

## 2019-10-25 DIAGNOSIS — L309 Dermatitis, unspecified: Secondary | ICD-10-CM | POA: Diagnosis not present

## 2019-10-25 DIAGNOSIS — Z0001 Encounter for general adult medical examination with abnormal findings: Secondary | ICD-10-CM | POA: Diagnosis not present

## 2019-10-25 DIAGNOSIS — N390 Urinary tract infection, site not specified: Secondary | ICD-10-CM | POA: Diagnosis not present

## 2019-10-25 DIAGNOSIS — D696 Thrombocytopenia, unspecified: Secondary | ICD-10-CM | POA: Diagnosis not present

## 2019-10-26 DIAGNOSIS — R600 Localized edema: Secondary | ICD-10-CM | POA: Diagnosis not present

## 2019-10-30 ENCOUNTER — Encounter: Payer: Self-pay | Admitting: Obstetrics & Gynecology

## 2019-10-30 ENCOUNTER — Ambulatory Visit: Payer: Medicare Other | Admitting: Obstetrics & Gynecology

## 2019-10-30 VITALS — BP 129/65 | HR 73 | Ht 66.0 in | Wt 198.0 lb

## 2019-10-30 DIAGNOSIS — R339 Retention of urine, unspecified: Secondary | ICD-10-CM

## 2019-10-30 DIAGNOSIS — N993 Prolapse of vaginal vault after hysterectomy: Secondary | ICD-10-CM

## 2019-10-30 NOTE — Progress Notes (Signed)
Chief Complaint  Patient presents with  . Prolapsed bladder    indwelling cath x2 weeks      69 y.o. G3P3000 No LMP recorded. Patient has had a hysterectomy. The current method of family planning is status post hysterectomy.  Outpatient Encounter Medications as of 10/30/2019  Medication Sig Note  . allopurinol (ZYLOPRIM) 100 MG tablet Take 100 mg by mouth every evening.    . cephALEXin (KEFLEX) 500 MG capsule Take 500 mg by mouth 2 (two) times daily.  (Patient not taking: Reported on 02/15/2020)   . hydrOXYzine (ATARAX/VISTARIL) 25 MG tablet Take 25 mg by mouth 3 (three) times daily as needed for anxiety or itching (Helps with neuropathy).    . Insulin NPH Human, Isophane, (NOVOLIN N [place]) Inject 50 Units into the skin 2 (two) times daily. 01/04/2018: 20 units  . insulin regular (NOVOLIN R,HUMULIN R) 100 units/mL injection Inject 5-20 Units into the skin 3 (three) times daily before meals.   [name] levothyroxine (SYNTHROID) 137 MCG tablet Take 137 mcg by mouth daily.   [name] NOVOLIN N RELION 100 UNIT/ML injection SMARTSIG:16 Unit(s) SUB-Q Twice Daily   . ONETOUCH ULTRA test strip    . Vitamin D, Ergocalciferol, (DRISDOL) 1.25 MG (50000 UT) CAPS capsule Take 50,000 Units by mouth once a week.   . clobetasol ointment (TEMOVATE) 0.05 % APPLY OINTMENT TO AFFECTED AREA TWICE DAILY FOR 14 DAYS (Patient not taking: Reported on 02/15/2020)   . [DISCONTINUED] traMADol (ULTRAM) 50 MG tablet Take 1 tablet (50 mg total) by mouth every 6 (six) hours as needed. (Patient not taking: Reported on 10/30/2019)    No facility-administered encounter medications on file as of 10/30/2019.    Subjective Patient is seen as a referral from urology due to secondary urinary retention due to pelvic organ prolapse She has had the catheter in for 2 weeks This is an acute problem  Past Medical History:  Diagnosis Date  . Alkaline phosphatase elevation   . Anemia   . ASD (atrial septal defect)   . Cataract    both eyes  hx of  . Chest pain    03-17-2013 last chest pain  . Chronic kidney disease    Stage III kidney disease  . Cirrhosis ([place])   . Depression   . Diabetes mellitus type II   . Fatigue   . GERD (gastroesophageal reflux disease)   . Gout   . Headache(784.0)    occasional  . Hyperlipidemia   . Hypothyroidism   . Neuropathy   . Peripheral neuropathy   . Retinopathy     Past Surgical History:  Procedure Laterality Date  . APPENDECTOMY    . CATARACT EXTRACTION    . CERVICAL SPINE SURGERY  2006  . CHOLECYSTECTOMY N/A 04/07/2013   Procedure: LAPAROSCOPIC CHOLECYSTECTOMY WITH INTRAOPERATIVE CHOLANGIOGRAM;  Surgeon: Merrie Roof, MD;  Location: [facility];  Service: General;  Laterality: N/A;  . COMBINED HYSTERECTOMY VAGINAL / OOPHORECTOMY / A&P REPAIR  1987   Unilateral oophorectomy, h/o uterine prolapse has right ovary  . ERCP N/A 04/06/2013   Procedure: ENDOSCOPIC RETROGRADE CHOLANGIOPANCREATOGRAPHY (ERCP);  Surgeon: Beryle Beams, MD;  Location: [facility];  Service: Endoscopy;  Laterality: N/A;  . EUS N/A 03/31/2013   Procedure: UPPER ENDOSCOPIC ULTRASOUND (EUS) LINEAR;  Surgeon: Beryle Beams, MD;  Location: [facility];  Service: Endoscopy;  Laterality: N/A;  . REFRACTIVE SURGERY    . SPINAL FUSION    . TONSILLECTOMY  age 75  OB History    Gravida  3   Para  3   Term  3   Preterm      AB      Living        SAB      IAB      Ectopic      Multiple      Live Births              Allergies  Allergen Reactions  . Statins Other (See Comments)    REACTION: diarrhea/joint pain/muscle aches. Tried at low doses.   . Dilaudid [Hydromorphone Hcl] Nausea And Vomiting    After one vomiting episode no further vomiting    Social History   Socioeconomic History  . Marital status: Divorced    Spouse name: Not on file  . Number of children: 3  . Years of education: 72  . Highest education level: Not on file  Occupational History  . 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 Smoking status: Never Smoker  . Smokeless tobacco: Never Used  Vaping Use  . Vaping Use: Never used  Substance and Sexual Activity  . Alcohol use: No  . Drug use: No  . Sexual activity: Not Currently    Birth control/protection: Surgical  Other Topics Concern  . Not on file  Social History Narrative   Divorced   Lives alone. Reports that her son recently moved out after living with her for a long time. She reports that she is happy to be living alone and feels like she was enabling his behavior. Reports that he had a history of drug use.   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    No pets.   Eats all food groups.    Wears seat belt.    Lives in house.    Smoke detectors.    Social Determinants of Health   Financial Resource Strain: Medium Risk  . Difficulty of Paying Living Expenses: Somewhat hard  Food Insecurity: No Food Insecurity  . Worried About Charity fundraiser in the Last Year: Never true  . Ran Out of Food in the Last Year: Never true  Transportation Needs: No Transportation Needs  . Lack of Transportation (Medical): No  . Lack of Transportation (Non-Medical): No  Physical Activity: Inactive  . Days of Exercise per Week: 0 days  . Minutes of Exercise per Session: 0 min  Stress: No Stress Concern Present  . Feeling of Stress : Not at all  Social Connections: Moderately Integrated  . Frequency of Communication with Friends and Family: More than three times a week  . Frequency of Social Gatherings with Friends and Family: Three times a week  . Attends Religious Services: More than 4 times per year  . Active Member of Clubs or Organizations: Yes  . Attends Archivist Meetings: 1 to 4 times per year  . Marital Status: Divorced    Family History  Problem Relation Age of Onset  . COPD Mother   . Lung cancer Father   . Diabetes Sister    . Cataracts Sister   . Insulin resistance Daughter   . Insulin resistance Son     Medications:       Current Outpatient Medications:  .  allopurinol (ZYLOPRIM) 100 MG tablet, Take 100 mg by mouth every evening. ,  Disp: , Rfl:  .  cephALEXin (KEFLEX) 500 MG capsule, Take 500 mg by mouth 2 (two) times daily.  (Patient not taking: Reported on 02/15/2020), Disp: , Rfl:  .  hydrOXYzine (ATARAX/VISTARIL) 25 MG tablet, Take 25 mg by mouth 3 (three) times daily as needed for anxiety or itching (Helps with neuropathy). , Disp: , Rfl:  .  Insulin NPH Human, Isophane, (NOVOLIN N [place]), Inject 50 Units into the skin 2 (two) times daily., Disp: , Rfl:  .  insulin regular (NOVOLIN R,HUMULIN R) 100 units/mL injection, Inject 5-20 Units into the skin 3 (three) times daily before meals., Disp: , Rfl:  .  levothyroxine (SYNTHROID) 137 MCG tablet, Take 137 mcg by mouth daily., Disp: , Rfl:  .  NOVOLIN N RELION 100 UNIT/ML injection, SMARTSIG:16 Unit(s) SUB-Q Twice Daily, Disp: , Rfl:  .  ONETOUCH ULTRA test strip, , Disp: , Rfl:  .  Vitamin D, Ergocalciferol, (DRISDOL) 1.25 MG (50000 UT) CAPS capsule, Take 50,000 Units by mouth once a week., Disp: , Rfl:  .  clobetasol ointment (TEMOVATE) 0.05 %, APPLY OINTMENT TO AFFECTED AREA TWICE DAILY FOR 14 DAYS (Patient not taking: Reported on 02/15/2020), Disp: , Rfl:  .  furosemide (LASIX) 40 MG tablet, Take 40 mg by mouth., Disp: , Rfl:   Objective Blood pressure 129/65, pulse 73, height 5' 6"  (1.676 m), weight 198 lb (89.8 kg).       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 Urinary retention due to kinking of the urethra from vault prolapse  R33.9     Established  relevant diagnosis(es):   Plan/Recommendations: No orders of the defined types were placed in this encounter.   Labs or Scans Ordered: No orders of the defined types were placed in this encounter.   Management:: I tried placing a Milex ring with support but was unsuccessful so she is fitted with a Gellhorn pessary 2 and three-quarter inch today The catheter is removed I will see her back in a short interval follow-up for evaluation in 2 weeks  Follow up Return in about 11 days (around 11/10/2019) for Follow up, with Dr Elonda Husky, pessary.      All questions were answered.

## 2019-11-10 ENCOUNTER — Ambulatory Visit: Payer: Medicare Other | Admitting: Obstetrics & Gynecology

## 2019-11-10 ENCOUNTER — Encounter: Payer: Self-pay | Admitting: Obstetrics & Gynecology

## 2019-11-10 VITALS — BP 119/61 | HR 70 | Wt 200.0 lb

## 2019-11-10 DIAGNOSIS — I129 Hypertensive chronic kidney disease with stage 1 through stage 4 chronic kidney disease, or unspecified chronic kidney disease: Secondary | ICD-10-CM | POA: Diagnosis not present

## 2019-11-10 DIAGNOSIS — N1831 Chronic kidney disease, stage 3a: Secondary | ICD-10-CM | POA: Diagnosis not present

## 2019-11-10 DIAGNOSIS — N189 Chronic kidney disease, unspecified: Secondary | ICD-10-CM | POA: Diagnosis not present

## 2019-11-10 DIAGNOSIS — D631 Anemia in chronic kidney disease: Secondary | ICD-10-CM | POA: Diagnosis not present

## 2019-11-10 DIAGNOSIS — R339 Retention of urine, unspecified: Secondary | ICD-10-CM | POA: Diagnosis not present

## 2019-11-10 DIAGNOSIS — N993 Prolapse of vaginal vault after hysterectomy: Secondary | ICD-10-CM | POA: Diagnosis not present

## 2019-11-10 DIAGNOSIS — E889 Metabolic disorder, unspecified: Secondary | ICD-10-CM | POA: Diagnosis not present

## 2019-11-10 DIAGNOSIS — M9080 Osteopathy in diseases classified elsewhere, unspecified site: Secondary | ICD-10-CM | POA: Diagnosis not present

## 2019-11-10 NOTE — Progress Notes (Signed)
Chief Complaint  Patient presents with  . Pessary Check    Blood pressure 119/61, pulse 70, weight 200 lb (90.7 kg).  [name] presents today for routine follow up related to her pessary.   She uses a Gelhorn pessary 2 3/4 in She reports no vaginal discharge or vaginal bleeding.  Exam reveals no undue vaginal mucosal pressure of breakdown, no discharge and no vaginal bleeding.  The pessary is removed, cleaned and replaced without difficulty.    [name] will be sen back in 1 months for continued follow up.  Florian Buff, MD  11/10/2019 12:46 PM

## 2019-11-23 DIAGNOSIS — N183 Chronic kidney disease, stage 3 unspecified: Secondary | ICD-10-CM | POA: Diagnosis not present

## 2019-11-23 DIAGNOSIS — E1165 Type 2 diabetes mellitus with hyperglycemia: Secondary | ICD-10-CM | POA: Diagnosis not present

## 2019-11-23 DIAGNOSIS — E039 Hypothyroidism, unspecified: Secondary | ICD-10-CM | POA: Diagnosis not present

## 2019-11-23 DIAGNOSIS — E11319 Type 2 diabetes mellitus with unspecified diabetic retinopathy without macular edema: Secondary | ICD-10-CM | POA: Diagnosis not present

## 2019-11-23 DIAGNOSIS — I129 Hypertensive chronic kidney disease with stage 1 through stage 4 chronic kidney disease, or unspecified chronic kidney disease: Secondary | ICD-10-CM | POA: Diagnosis not present

## 2019-12-11 ENCOUNTER — Encounter: Payer: Self-pay | Admitting: Obstetrics & Gynecology

## 2019-12-11 ENCOUNTER — Other Ambulatory Visit: Payer: Self-pay

## 2019-12-11 ENCOUNTER — Ambulatory Visit: Payer: Medicare Other | Admitting: Obstetrics & Gynecology

## 2019-12-11 VITALS — BP 131/70 | HR 74 | Wt 194.0 lb

## 2019-12-11 DIAGNOSIS — N993 Prolapse of vaginal vault after hysterectomy: Secondary | ICD-10-CM

## 2019-12-11 DIAGNOSIS — Z4689 Encounter for fitting and adjustment of other specified devices: Secondary | ICD-10-CM

## 2019-12-11 NOTE — Progress Notes (Signed)
Chief Complaint  Patient presents with  . Pessary Check    Blood pressure 131/70, pulse 74, weight 194 lb (88 kg).  [name] presents today for routine follow up related to her pessary.   She uses a Gelhorn pessary 2 3/4inch She reports no vaginal discharge or vaginal bleeding.  Exam reveals no undue vaginal mucosal pressure of breakdown, no discharge and no vaginal bleeding.  The pessary is removed, cleaned and replaced without difficulty.    [name] will be sen back in 3 months for continued follow up.    ICD-10-CM   1. Pessary maintenance< Gelhorn pessary 2 3/4 inch, original fit 8/21  Z46.89   2. 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

## 2019-12-12 DIAGNOSIS — E113592 Type 2 diabetes mellitus with proliferative diabetic retinopathy without macular edema, left eye: Secondary | ICD-10-CM | POA: Diagnosis not present

## 2019-12-12 DIAGNOSIS — E113491 Type 2 diabetes mellitus with severe nonproliferative diabetic retinopathy without macular edema, right eye: Secondary | ICD-10-CM | POA: Diagnosis not present

## 2020-01-03 DIAGNOSIS — E11319 Type 2 diabetes mellitus with unspecified diabetic retinopathy without macular edema: Secondary | ICD-10-CM | POA: Diagnosis not present

## 2020-01-03 DIAGNOSIS — K7581 Nonalcoholic steatohepatitis (NASH): Secondary | ICD-10-CM | POA: Diagnosis not present

## 2020-01-03 DIAGNOSIS — D696 Thrombocytopenia, unspecified: Secondary | ICD-10-CM | POA: Diagnosis not present

## 2020-01-03 DIAGNOSIS — Z0001 Encounter for general adult medical examination with abnormal findings: Secondary | ICD-10-CM | POA: Diagnosis not present

## 2020-01-03 DIAGNOSIS — L309 Dermatitis, unspecified: Secondary | ICD-10-CM | POA: Diagnosis not present

## 2020-01-08 DIAGNOSIS — K7469 Other cirrhosis of liver: Secondary | ICD-10-CM | POA: Diagnosis not present

## 2020-01-08 DIAGNOSIS — E11319 Type 2 diabetes mellitus with unspecified diabetic retinopathy without macular edema: Secondary | ICD-10-CM | POA: Diagnosis not present

## 2020-01-08 DIAGNOSIS — N189 Chronic kidney disease, unspecified: Secondary | ICD-10-CM | POA: Diagnosis not present

## 2020-01-08 DIAGNOSIS — E559 Vitamin D deficiency, unspecified: Secondary | ICD-10-CM | POA: Diagnosis not present

## 2020-01-08 DIAGNOSIS — E039 Hypothyroidism, unspecified: Secondary | ICD-10-CM | POA: Diagnosis not present

## 2020-01-11 ENCOUNTER — Other Ambulatory Visit: Payer: Self-pay

## 2020-01-11 ENCOUNTER — Encounter: Payer: Self-pay | Admitting: Obstetrics & Gynecology

## 2020-01-11 ENCOUNTER — Ambulatory Visit: Payer: Medicare Other | Admitting: Obstetrics & Gynecology

## 2020-01-11 VITALS — BP 132/69 | HR 69 | Ht 66.0 in | Wt 195.0 lb

## 2020-01-11 DIAGNOSIS — Z4689 Encounter for fitting and adjustment of other specified devices: Secondary | ICD-10-CM

## 2020-01-11 DIAGNOSIS — N993 Prolapse of vaginal vault after hysterectomy: Secondary | ICD-10-CM

## 2020-01-11 DIAGNOSIS — R339 Retention of urine, unspecified: Secondary | ICD-10-CM | POA: Diagnosis not present

## 2020-01-11 NOTE — Progress Notes (Signed)
Chief Complaint  Patient presents with   Pessary Check    Blood pressure 132/69, pulse 69, height 5' 6"  (1.676 m), weight 195 lb (88.5 kg).  [name] presents today for routine follow up related to her pessary.   She uses a Gelhorn 2 3/4 inch She reports no vaginal discharge or vaginal bleeding.  Exam reveals no undue vaginal mucosal pressure of breakdown, no discharge and no vaginal bleeding.  The pessary is removed, cleaned and replaced without difficulty.    [name] will be sen back in keep months for continued follow up.  Florian Buff, MD  01/11/2020 12:29 PM

## 2020-02-08 ENCOUNTER — Inpatient Hospital Stay (HOSPITAL_COMMUNITY): Payer: Medicare Other | Attending: Hematology | Admitting: Hematology

## 2020-02-08 ENCOUNTER — Other Ambulatory Visit: Payer: Self-pay

## 2020-02-08 ENCOUNTER — Inpatient Hospital Stay (HOSPITAL_COMMUNITY): Payer: Medicare Other

## 2020-02-08 VITALS — BP 152/54 | Temp 97.3°F | Resp 18 | Wt 188.6 lb

## 2020-02-08 DIAGNOSIS — D696 Thrombocytopenia, unspecified: Secondary | ICD-10-CM | POA: Diagnosis not present

## 2020-02-08 DIAGNOSIS — Z23 Encounter for immunization: Secondary | ICD-10-CM | POA: Diagnosis not present

## 2020-02-08 DIAGNOSIS — D509 Iron deficiency anemia, unspecified: Secondary | ICD-10-CM | POA: Insufficient documentation

## 2020-02-08 DIAGNOSIS — M8580 Other specified disorders of bone density and structure, unspecified site: Secondary | ICD-10-CM | POA: Diagnosis not present

## 2020-02-08 DIAGNOSIS — E559 Vitamin D deficiency, unspecified: Secondary | ICD-10-CM | POA: Insufficient documentation

## 2020-02-08 DIAGNOSIS — D500 Iron deficiency anemia secondary to blood loss (chronic): Secondary | ICD-10-CM

## 2020-02-08 LAB — CBC WITH DIFFERENTIAL/PLATELET
Abs Immature Granulocytes: 0.02 10*3/uL (ref 0.00–0.07)
Basophils Absolute: 0 10*3/uL (ref 0.0–0.1)
Basophils Relative: 1 %
Eosinophils Absolute: 0.3 10*3/uL (ref 0.0–0.5)
Eosinophils Relative: 5 %
HCT: 31.8 % — ABNORMAL LOW (ref 36.0–46.0)
Hemoglobin: 10.1 g/dL — ABNORMAL LOW (ref 12.0–15.0)
Immature Granulocytes: 0 %
Lymphocytes Relative: 17 %
Lymphs Abs: 0.9 10*3/uL (ref 0.7–4.0)
MCH: 30.7 pg (ref 26.0–34.0)
MCHC: 31.8 g/dL (ref 30.0–36.0)
MCV: 96.7 fL (ref 80.0–100.0)
Monocytes Absolute: 0.4 10*3/uL (ref 0.1–1.0)
Monocytes Relative: 7 %
Neutro Abs: 3.9 10*3/uL (ref 1.7–7.7)
Neutrophils Relative %: 70 %
Platelets: 59 10*3/uL — ABNORMAL LOW (ref 150–400)
RBC: 3.29 MIL/uL — ABNORMAL LOW (ref 3.87–5.11)
RDW: 15.4 % (ref 11.5–15.5)
WBC: 5.5 10*3/uL (ref 4.0–10.5)
nRBC: 0 % (ref 0.0–0.2)

## 2020-02-08 LAB — COMPREHENSIVE METABOLIC PANEL
ALT: 20 U/L (ref 0–44)
AST: 30 U/L (ref 15–41)
Albumin: 2.9 g/dL — ABNORMAL LOW (ref 3.5–5.0)
Alkaline Phosphatase: 96 U/L (ref 38–126)
Anion gap: 8 (ref 5–15)
BUN: 30 mg/dL — ABNORMAL HIGH (ref 8–23)
CO2: 26 mmol/L (ref 22–32)
Calcium: 8.5 mg/dL — ABNORMAL LOW (ref 8.9–10.3)
Chloride: 103 mmol/L (ref 98–111)
Creatinine, Ser: 1.22 mg/dL — ABNORMAL HIGH (ref 0.44–1.00)
GFR, Estimated: 48 mL/min — ABNORMAL LOW (ref >60)
Glucose, Bld: 146 mg/dL — ABNORMAL HIGH (ref 70–99)
Potassium: 3.7 mmol/L (ref 3.5–5.1)
Sodium: 137 mmol/L (ref 135–145)
Total Bilirubin: 1.1 mg/dL (ref 0.3–1.2)
Total Protein: 6.5 g/dL (ref 6.5–8.1)

## 2020-02-08 LAB — VITAMIN D 25 HYDROXY (VIT D DEFICIENCY, FRACTURES): Vit D, 25-Hydroxy: 54.73 ng/mL (ref 30–100)

## 2020-02-08 MED ORDER — INFLUENZA VAC A&B SA ADJ QUAD 0.5 ML IM PRSY: 0.5000 mL | PREFILLED_SYRINGE | Freq: Once | INTRAMUSCULAR | Status: DC

## 2020-02-08 NOTE — Patient Instructions (Addendum)
[place] at [facility] Discharge Instructions  You were seen today by Dr. Delton Coombes. He went over your recent results. You will be scheduled for 1 iron infusion. Your next appointment will be with the nurse practitioner in 6 months for labs and follow up.   Thank you for choosing [place] at [facility] to provide your oncology and hematology care.  To afford each patient quality time with our provider, please arrive at least 15 minutes before your scheduled appointment time.   If you have a lab appointment with the [facility] please come in thru the Main Entrance and check in at the main information desk  You need to re-schedule your appointment should you arrive 10 or more minutes late.  We strive to give you quality time with our providers, and arriving late affects you and other patients whose appointments are after yours.  Also, if you no show three or more times for appointments you may be dismissed from the clinic at the providers discretion.     Again, thank you for choosing [facility].  Our hope is that these requests will decrease the amount of time that you wait before being seen by our physicians.       _____________________________________________________________  Should you have questions after your visit to [facility], please contact our office at [phone] between the hours of 8:00 a.m. and 4:30 p.m.  Voicemails left after 4:00 p.m. will not be returned until the following business day.  For prescription refill requests, have your pharmacy contact our office and allow 72 hours.    Cancer Center Support Programs:   > Cancer Support Group  2nd Tuesday of the month 1pm-2pm, Journey Room

## 2020-02-08 NOTE — Progress Notes (Signed)
[name], [place] [address]   CLINIC:  Medical Oncology/Hematology  PCP:  Celene Squibb, MD [address] [name] [place] [address]  [phone]  REASON FOR VISIT:  Follow-up for thrombocytopenia and IDA  PRIOR THERAPY: None  CURRENT THERAPY: Observation and intermittent Feraheme last on 01/22/2017  INTERVAL HISTORY:  [name], a 69 y.o. female, returns for routine follow-up for her thrombocytopenia. [name] was last seen on 01/04/2018.  Today she reports feeling okay. She had some bleeding after a prolapsed bladder and was also informed of having hemorrhoids; after pessary was placed, her bleeding stopped. She denies gum bleeds, though will have some nosebleeds if she blows her nose too hard and she bruises easily. She took iron tablets in 2019-2020, but it caused her abdominal pain, nausea and constipation.   She is open to getting a flu shot today.   REVIEW OF SYSTEMS:  Review of Systems  Constitutional: Positive for appetite change (75%) and fatigue (50%).  HENT:   Positive for trouble swallowing (difficulty chewing).   Gastrointestinal: Positive for abdominal pain, blood in stool (d/t hemorrhoids), constipation and diarrhea.  Genitourinary: Positive for difficulty urinating (prolapsed bladder).   Musculoskeletal: Positive for back pain (7/10 back pain).  Neurological: Positive for dizziness and headaches (occasional).  Hematological: Bruises/bleeds easily.  All other systems reviewed and are negative.   PAST MEDICAL/SURGICAL HISTORY:  Past Medical History:  Diagnosis Date  . Alkaline phosphatase elevation   . Anemia   . ASD (atrial septal defect)   . Cataract    both eyes hx of  . Chest pain    03-17-2013 last chest pain  . Chronic kidney disease    Stage III kidney disease  . Cirrhosis ([place])   . Depression   . Diabetes mellitus type II   . Fatigue   . GERD (gastroesophageal reflux disease)   . Gout   .  Headache(784.0)    occasional  . Hyperlipidemia   . Hypothyroidism   . Neuropathy   . Peripheral neuropathy   . Retinopathy    Past Surgical History:  Procedure Laterality Date  . APPENDECTOMY    . CATARACT EXTRACTION    . CERVICAL SPINE SURGERY  2006  . CHOLECYSTECTOMY N/A 04/07/2013   Procedure: LAPAROSCOPIC CHOLECYSTECTOMY WITH INTRAOPERATIVE CHOLANGIOGRAM;  Surgeon: Merrie Roof, MD;  Location: [facility];  Service: General;  Laterality: N/A;  . COMBINED HYSTERECTOMY VAGINAL / OOPHORECTOMY / A&P REPAIR  1987   Unilateral oophorectomy, h/o uterine prolapse has right ovary  . ERCP N/A 04/06/2013   Procedure: ENDOSCOPIC RETROGRADE CHOLANGIOPANCREATOGRAPHY (ERCP);  Surgeon: Beryle Beams, MD;  Location: [facility];  Service: Endoscopy;  Laterality: N/A;  . EUS N/A 03/31/2013   Procedure: UPPER ENDOSCOPIC ULTRASOUND (EUS) LINEAR;  Surgeon: Beryle Beams, MD;  Location: [facility];  Service: Endoscopy;  Laterality: N/A;  . REFRACTIVE SURGERY    . SPINAL FUSION    . TONSILLECTOMY  age 46    SOCIAL HISTORY:  Social History   Socioeconomic History  . Marital status: Divorced    Spouse name: Not on file  . Number of children: 3  . Years of education: 103  . Highest education level: Not on file  Occupational History  . 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 Smoking status: Never Smoker  . Smokeless tobacco: Never Used  Vaping Use  . Vaping Use: Never used  Substance and Sexual Activity  . Alcohol use: No  . Drug use: No  . Sexual activity: Not Currently    Birth control/protection: Surgical  Other Topics Concern  . Not on file  Social History Narrative   Divorced   Lives alone. Reports that her son recently moved out after living with her for a long time. She reports that she is happy to be living alone and feels like she was enabling his behavior. Reports that he had a history of drug use.   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    No pets.   Eats all food groups.    Wears seat belt.    Lives in house.    Smoke detectors.    Social Determinants of Health   Financial Resource Strain: Medium Risk  . Difficulty of Paying Living Expenses: Somewhat hard  Food Insecurity: No Food Insecurity  . Worried About Charity fundraiser in the Last Year: Never true  . Ran Out of Food in the Last Year: Never true  Transportation Needs: No Transportation Needs  . Lack of Transportation (Medical): No  . Lack of Transportation (Non-Medical): No  Physical Activity: Inactive  . Days of Exercise per Week: 0 days  . Minutes of Exercise per Session: 0 min  Stress: No Stress Concern Present  . Feeling of Stress : Not at all  Social Connections: Moderately Integrated  . Frequency of Communication with Friends and Family: More than three times a week  . Frequency of Social Gatherings with Friends and Family: Three times a week  . Attends Religious Services: More than 4 times per year  . Active Member of Clubs or Organizations: Yes  . Attends Archivist Meetings: 1 to 4 times per year  . Marital Status: Divorced  Human resources officer Violence: Not At Risk  . Fear of Current or Ex-Partner: No  . Emotionally Abused: No  . Physically Abused: No  . Sexually Abused: No    FAMILY HISTORY:  Family History  Problem Relation Age of Onset  . COPD Mother   . Lung cancer Father   . Diabetes Sister   . Cataracts Sister   . Insulin resistance Daughter   . Insulin resistance Son     CURRENT MEDICATIONS:  Current Outpatient Medications  Medication Sig Dispense Refill  . allopurinol (ZYLOPRIM) 100 MG tablet Take 100 mg by mouth every evening.     . cephALEXin (KEFLEX) 500 MG capsule Take 500 mg by mouth 2 (two) times daily.     . clobetasol ointment (TEMOVATE) 0.05 % APPLY OINTMENT TO AFFECTED AREA TWICE DAILY FOR 14 DAYS    . furosemide (LASIX) 40 MG tablet Take 40 mg by  mouth.    . Insulin NPH Human, Isophane, (NOVOLIN N [place]) Inject 50 Units into the skin 2 (two) times daily.    . insulin regular (NOVOLIN R,HUMULIN R) 100 units/mL injection Inject 5-20 Units into the skin 3 (three) times daily before meals.    [name] levothyroxine (SYNTHROID) 137 MCG tablet Take 137 mcg by mouth daily.    [name] NOVOLIN N RELION 100 UNIT/ML injection SMARTSIG:16 Unit(s) SUB-Q Twice Daily    . ONETOUCH ULTRA test strip     . Vitamin D, Ergocalciferol, (DRISDOL) 1.25 MG (50000 UT) CAPS capsule Take 50,000 Units by mouth once a week.    . hydrOXYzine (ATARAX/VISTARIL) 25 MG tablet  Take 25 mg by mouth 3 (three) times daily as needed for anxiety or itching (Helps with neuropathy).  (Patient not taking: Reported on 02/08/2020)     Current Facility-Administered Medications  Medication Dose Route Frequency Provider Last Rate Last Admin  . influenza vaccine adjuvanted (FLUAD) injection 0.5 mL  0.5 mL Intramuscular Once Derek Jack, MD        ALLERGIES:  Allergies  Allergen Reactions  . Statins Other (See Comments)    REACTION: diarrhea/joint pain/muscle aches. Tried at low doses.   . Dilaudid [Hydromorphone Hcl] Nausea And Vomiting    After one vomiting episode no further vomiting    PHYSICAL EXAM:  Performance status (ECOG): 1 - Symptomatic but completely ambulatory  Vitals:   02/08/20 1355  BP: (!) 152/54  Resp: 18  Temp: (!) 97.3 F (36.3 C)  SpO2: 100%   Wt Readings from Last 3 Encounters:  02/08/20 188 lb 9.6 oz (85.5 kg)  01/11/20 195 lb (88.5 kg)  12/11/19 194 lb (88 kg)   Physical Exam Vitals reviewed.  Constitutional:      Appearance: Normal appearance.  Cardiovascular:     Rate and Rhythm: Normal rate and regular rhythm.     Pulses: Normal pulses.     Heart sounds: Normal heart sounds.  Pulmonary:     Effort: Pulmonary effort is normal.     Breath sounds: Normal breath sounds.  Abdominal:     Palpations: Abdomen is soft. There is no hepatomegaly,  splenomegaly or mass.     Tenderness: There is no abdominal tenderness.     Hernia: No hernia is present.  Lymphadenopathy:     Upper Body:     Right upper body: No supraclavicular adenopathy.     Left upper body: No supraclavicular adenopathy.     Lower Body: No right inguinal adenopathy. No left inguinal adenopathy.  Neurological:     General: No focal deficit present.     Mental Status: She is alert and oriented to person, place, and time.  Psychiatric:        Mood and Affect: Mood normal.        Behavior: Behavior normal.     LABORATORY DATA:  I have reviewed the labs as listed.  CBC Latest Ref Rng & Units 02/08/2020 08/11/2019 02/01/2019  WBC 4.0 - 10.5 K/uL 5.5 3.6(L) 4.1  Hemoglobin 12.0 - 15.0 g/dL 10.1(L) 10.6(L) 11.5(L)  Hematocrit 36 - 46 % 31.8(L) 33.8(L) 36.7  Platelets 150 - 400 K/uL 59(L) 51(L) 58(L)   CMP Latest Ref Rng & Units 02/08/2020 08/11/2019 02/01/2019  Glucose 70 - 99 mg/dL 146(H) 132(H) 130(H)  BUN 8 - 23 mg/dL 30(H) 22 22  Creatinine 0.44 - 1.00 mg/dL 1.22(H) 0.99 1.16(H)  Sodium 135 - 145 mmol/L 137 136 141  Potassium 3.5 - 5.1 mmol/L 3.7 4.0 4.4  Chloride 98 - 111 mmol/L 103 106 109  CO2 22 - 32 mmol/L 26 24 23   Calcium 8.9 - 10.3 mg/dL 8.5(L) 8.4(L) 8.5(L)  Total Protein 6.5 - 8.1 g/dL 6.5 6.3(L) 6.4(L)  Total Bilirubin 0.3 - 1.2 mg/dL 1.1 1.0 1.2  Alkaline Phos 38 - 126 U/L 96 116 150(H)  AST 15 - 41 U/L 30 35 32  ALT 0 - 44 U/L 20 23 20       Component Value Date/Time   RBC 3.29 (L) 02/08/2020 1233   MCV 96.7 02/08/2020 1233   MCH 30.7 02/08/2020 1233   MCHC 31.8 02/08/2020 1233   RDW 15.4 02/08/2020 1233  LYMPHSABS 0.9 02/08/2020 1233   MONOABS 0.4 02/08/2020 1233   EOSABS 0.3 02/08/2020 1233   BASOSABS 0.0 02/08/2020 1233   Lab Results  Component Value Date   VD25OH 28 (L) 07/02/2017    DIAGNOSTIC IMAGING:  I have independently reviewed the scans and discussed with the patient. No results found.   ASSESSMENT:  1.  Moderate  thrombocytopenia: - Her CBC on 07/06/2017 shows a platelet count of 66.  She has mild thrombocytopenia, mostly above 100 since 2015.  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 - Differential diagnosis includes thrombocytopenia secondary to splenomegaly, immune mediated thrombocytopenia and possible MDS. -Nutritional deficiency work-up was negative.  ANA was 1:80 speckled pattern positive, although weakly.  She does not have any clinical signs or symptoms of lupus.  We considered it is false-positive.   2.  Iron deficiency anemia: -Last Feraheme was on 01/22/2017.  3.  Osteopenia: -Her last DEXA scan was on 07/08/2017 with T score of -1.4 in the left femoral neck.  I have recommended calcium and vitamin D supplements and weightbearing exercises.   PLAN:  1.  Moderate thrombocytopenia: -She has easy bruising but denies any bleeding. -CBC on 02/08/2020 showed platelet count 59. -This is from splenomegaly from cirrhosis.  There might be underlying component of immune thrombocytopenia/MDS. -No active intervention necessary at this time.  RTC 6 months with labs.  2.  Iron deficiency anemia: -Hemoglobin today is 10.1 with MCV of 96.7. -Recommend Feraheme x1. -Repeat iron panel in 6 months.  3.  Osteopenia: -Consider repeating DEXA scan around April 2022.   Orders placed this encounter:  No orders of the defined types were placed in this encounter.    Derek Jack, MD [place] [phone]   I, Milinda Antis, am acting as a scribe for Dr. Sanda Linger.  I, Derek Jack MD, have reviewed the above documentation for accuracy and completeness, and I agree with the above.

## 2020-02-15 ENCOUNTER — Inpatient Hospital Stay (HOSPITAL_COMMUNITY): Payer: Medicare Other

## 2020-02-15 ENCOUNTER — Other Ambulatory Visit: Payer: Self-pay

## 2020-02-15 ENCOUNTER — Encounter (HOSPITAL_COMMUNITY): Payer: Self-pay

## 2020-02-15 VITALS — BP 141/73 | HR 65 | Temp 97.0°F | Resp 18

## 2020-02-15 DIAGNOSIS — N183 Chronic kidney disease, stage 3 unspecified: Secondary | ICD-10-CM

## 2020-02-15 DIAGNOSIS — Z23 Encounter for immunization: Secondary | ICD-10-CM | POA: Diagnosis not present

## 2020-02-15 DIAGNOSIS — E559 Vitamin D deficiency, unspecified: Secondary | ICD-10-CM | POA: Diagnosis not present

## 2020-02-15 DIAGNOSIS — D509 Iron deficiency anemia, unspecified: Secondary | ICD-10-CM | POA: Diagnosis not present

## 2020-02-15 DIAGNOSIS — M8580 Other specified disorders of bone density and structure, unspecified site: Secondary | ICD-10-CM | POA: Diagnosis not present

## 2020-02-15 DIAGNOSIS — E1122 Type 2 diabetes mellitus with diabetic chronic kidney disease: Secondary | ICD-10-CM

## 2020-02-15 MED ORDER — SODIUM CHLORIDE 0.9 % IV SOLN: Freq: Once | INTRAVENOUS | Status: AC

## 2020-02-15 MED ORDER — INFLUENZA VAC A&B SA ADJ QUAD 0.5 ML IM PRSY
0.5000 mL | PREFILLED_SYRINGE | Freq: Once | INTRAMUSCULAR | Status: AC
  Administered 2020-02-15: 0.5 mL via INTRAMUSCULAR
  Filled 2020-02-15: qty 0.5

## 2020-02-15 MED ORDER — SODIUM CHLORIDE 0.9 % IV SOLN
510.0000 mg | Freq: Once | INTRAVENOUS | Status: AC
  Administered 2020-02-15: 510 mg via INTRAVENOUS
  Filled 2020-02-15: qty 510

## 2020-02-15 NOTE — Progress Notes (Signed)
Treatment given today per MD orders.  Tolerated infusion without adverse affects.  Vital signs stable.  No complaints at this time.  Discharge from clinic ambulatory in stable condition.  Alert and oriented X 3.  Follow up with [facility] as scheduled.

## 2020-02-15 NOTE — Progress Notes (Signed)
Patient presents today for Feraheme infusion. MAR reviewed and updated. Vital signs stable. Patient has no complaints of pain today. Patient denies any changes since her last visit. Patient states she had Iron in the past. 2018. No complications noted.

## 2020-02-15 NOTE — Patient Instructions (Signed)
[facility] Discharge Instructions for Patients  Today you received the following.   To help prevent nausea and vomiting after your treatment, we encourage you to take your nausea medication If you develop nausea and vomiting that is not controlled by your nausea medication, call the clinic.   BELOW ARE SYMPTOMS THAT SHOULD BE REPORTED IMMEDIATELY:  *FEVER GREATER THAN 100.5 F  *CHILLS WITH OR WITHOUT FEVER  NAUSEA AND VOMITING THAT IS NOT CONTROLLED WITH YOUR NAUSEA MEDICATION  *UNUSUAL SHORTNESS OF BREATH  *UNUSUAL BRUISING OR BLEEDING  TENDERNESS IN MOUTH AND THROAT WITH OR WITHOUT PRESENCE OF ULCERS  *URINARY PROBLEMS  *BOWEL PROBLEMS  UNUSUAL RASH Items with * indicate a potential emergency and should be followed up as soon as possible.  Feel free to call the clinic should you have any questions or concerns. The clinic phone number is [phone].  Please show the [facility] at check-in to the Emergency Department and triage nurse.

## 2020-02-27 ENCOUNTER — Ambulatory Visit (HOSPITAL_COMMUNITY): Payer: Medicare Other

## 2020-03-11 ENCOUNTER — Other Ambulatory Visit: Payer: Self-pay

## 2020-03-11 ENCOUNTER — Encounter: Payer: Self-pay | Admitting: Obstetrics & Gynecology

## 2020-03-11 ENCOUNTER — Ambulatory Visit: Payer: Medicare Other | Admitting: Obstetrics & Gynecology

## 2020-03-11 VITALS — BP 141/64 | HR 74 | Ht 66.0 in | Wt 186.0 lb

## 2020-03-11 DIAGNOSIS — Z4689 Encounter for fitting and adjustment of other specified devices: Secondary | ICD-10-CM | POA: Diagnosis not present

## 2020-03-11 DIAGNOSIS — N993 Prolapse of vaginal vault after hysterectomy: Secondary | ICD-10-CM

## 2020-03-11 NOTE — Progress Notes (Signed)
Chief Complaint  Patient presents with  . Pessary Check    Blood pressure (!) 141/64, pulse 74, height 5' 6"  (1.676 m), weight 186 lb (84.4 kg).  [name] presents today for routine follow up related to her pessary.   She uses a Gelhorn pessary 2 3/4 nch She reports no vaginal discharge and no vaginal bleeding   Likert scale(1 not bothersome -5 very bothersome)  :  2  Exam reveals no undue vaginal mucosal pressure of breakdown, no discharge and no vaginal bleeding.  Vaginal Epithelial Abnormality Classification System:   0 0    No abnormalities 1    Epithelial erythema 2    Granulation tissue 3    Epithelial break or erosion, 1 cm or less 4    Epithelial break or erosion, 1 cm or greater  The pessary is removed, cleaned and replaced without difficulty.      ICD-10-CM   1. Pessary maintenance< Gelhorn pessary 2 3/4 inch, original fit 8/21  Z46.89   2. 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  Florian Buff, MD  03/11/2020 2:23 PM

## 2020-04-18 ENCOUNTER — Encounter (INDEPENDENT_AMBULATORY_CARE_PROVIDER_SITE_OTHER): Payer: Medicare Other | Admitting: Ophthalmology

## 2020-04-22 DIAGNOSIS — L309 Dermatitis, unspecified: Secondary | ICD-10-CM | POA: Diagnosis not present

## 2020-04-22 DIAGNOSIS — K7581 Nonalcoholic steatohepatitis (NASH): Secondary | ICD-10-CM | POA: Diagnosis not present

## 2020-04-22 DIAGNOSIS — E11319 Type 2 diabetes mellitus with unspecified diabetic retinopathy without macular edema: Secondary | ICD-10-CM | POA: Diagnosis not present

## 2020-04-22 DIAGNOSIS — Z0001 Encounter for general adult medical examination with abnormal findings: Secondary | ICD-10-CM | POA: Diagnosis not present

## 2020-04-22 DIAGNOSIS — D696 Thrombocytopenia, unspecified: Secondary | ICD-10-CM | POA: Diagnosis not present

## 2020-04-25 DIAGNOSIS — E11319 Type 2 diabetes mellitus with unspecified diabetic retinopathy without macular edema: Secondary | ICD-10-CM | POA: Diagnosis not present

## 2020-04-25 DIAGNOSIS — D696 Thrombocytopenia, unspecified: Secondary | ICD-10-CM | POA: Diagnosis not present

## 2020-04-25 DIAGNOSIS — E039 Hypothyroidism, unspecified: Secondary | ICD-10-CM | POA: Diagnosis not present

## 2020-04-25 DIAGNOSIS — K7469 Other cirrhosis of liver: Secondary | ICD-10-CM | POA: Diagnosis not present

## 2020-04-25 DIAGNOSIS — N189 Chronic kidney disease, unspecified: Secondary | ICD-10-CM | POA: Diagnosis not present

## 2020-04-25 DIAGNOSIS — K5900 Constipation, unspecified: Secondary | ICD-10-CM | POA: Diagnosis not present

## 2020-04-25 DIAGNOSIS — E559 Vitamin D deficiency, unspecified: Secondary | ICD-10-CM | POA: Diagnosis not present

## 2020-04-25 DIAGNOSIS — K219 Gastro-esophageal reflux disease without esophagitis: Secondary | ICD-10-CM | POA: Diagnosis not present

## 2020-04-25 DIAGNOSIS — M109 Gout, unspecified: Secondary | ICD-10-CM | POA: Diagnosis not present

## 2020-04-25 DIAGNOSIS — E782 Mixed hyperlipidemia: Secondary | ICD-10-CM | POA: Diagnosis not present

## 2020-04-25 DIAGNOSIS — J309 Allergic rhinitis, unspecified: Secondary | ICD-10-CM | POA: Diagnosis not present

## 2020-05-06 ENCOUNTER — Other Ambulatory Visit: Payer: Self-pay

## 2020-05-06 ENCOUNTER — Encounter (INDEPENDENT_AMBULATORY_CARE_PROVIDER_SITE_OTHER): Payer: Medicare Other | Admitting: Ophthalmology

## 2020-05-06 DIAGNOSIS — E113512 Type 2 diabetes mellitus with proliferative diabetic retinopathy with macular edema, left eye: Secondary | ICD-10-CM | POA: Diagnosis not present

## 2020-05-06 DIAGNOSIS — H43813 Vitreous degeneration, bilateral: Secondary | ICD-10-CM | POA: Diagnosis not present

## 2020-05-06 DIAGNOSIS — E113391 Type 2 diabetes mellitus with moderate nonproliferative diabetic retinopathy without macular edema, right eye: Secondary | ICD-10-CM | POA: Diagnosis not present

## 2020-05-27 DIAGNOSIS — E1165 Type 2 diabetes mellitus with hyperglycemia: Secondary | ICD-10-CM | POA: Diagnosis not present

## 2020-05-27 DIAGNOSIS — E039 Hypothyroidism, unspecified: Secondary | ICD-10-CM | POA: Diagnosis not present

## 2020-05-27 DIAGNOSIS — M1A0790 Idiopathic chronic gout, unspecified ankle and foot, without tophus (tophi): Secondary | ICD-10-CM | POA: Diagnosis not present

## 2020-05-27 DIAGNOSIS — E1140 Type 2 diabetes mellitus with diabetic neuropathy, unspecified: Secondary | ICD-10-CM | POA: Diagnosis not present

## 2020-05-27 DIAGNOSIS — I129 Hypertensive chronic kidney disease with stage 1 through stage 4 chronic kidney disease, or unspecified chronic kidney disease: Secondary | ICD-10-CM | POA: Diagnosis not present

## 2020-05-27 DIAGNOSIS — R7989 Other specified abnormal findings of blood chemistry: Secondary | ICD-10-CM | POA: Diagnosis not present

## 2020-05-27 DIAGNOSIS — N183 Chronic kidney disease, stage 3 unspecified: Secondary | ICD-10-CM | POA: Diagnosis not present

## 2020-05-27 DIAGNOSIS — E7800 Pure hypercholesterolemia, unspecified: Secondary | ICD-10-CM | POA: Diagnosis not present

## 2020-05-31 DIAGNOSIS — N1831 Chronic kidney disease, stage 3a: Secondary | ICD-10-CM | POA: Diagnosis not present

## 2020-06-03 ENCOUNTER — Encounter (INDEPENDENT_AMBULATORY_CARE_PROVIDER_SITE_OTHER): Payer: Medicare Other | Admitting: Ophthalmology

## 2020-06-03 ENCOUNTER — Other Ambulatory Visit: Payer: Self-pay

## 2020-06-03 DIAGNOSIS — H43813 Vitreous degeneration, bilateral: Secondary | ICD-10-CM

## 2020-06-03 DIAGNOSIS — E113391 Type 2 diabetes mellitus with moderate nonproliferative diabetic retinopathy without macular edema, right eye: Secondary | ICD-10-CM

## 2020-06-03 DIAGNOSIS — E113512 Type 2 diabetes mellitus with proliferative diabetic retinopathy with macular edema, left eye: Secondary | ICD-10-CM

## 2020-06-05 DIAGNOSIS — M9080 Osteopathy in diseases classified elsewhere, unspecified site: Secondary | ICD-10-CM | POA: Diagnosis not present

## 2020-06-05 DIAGNOSIS — I129 Hypertensive chronic kidney disease with stage 1 through stage 4 chronic kidney disease, or unspecified chronic kidney disease: Secondary | ICD-10-CM | POA: Diagnosis not present

## 2020-06-05 DIAGNOSIS — D631 Anemia in chronic kidney disease: Secondary | ICD-10-CM | POA: Diagnosis not present

## 2020-06-05 DIAGNOSIS — N1831 Chronic kidney disease, stage 3a: Secondary | ICD-10-CM | POA: Diagnosis not present

## 2020-06-05 DIAGNOSIS — E889 Metabolic disorder, unspecified: Secondary | ICD-10-CM | POA: Diagnosis not present

## 2020-06-10 ENCOUNTER — Ambulatory Visit: Payer: Medicare Other | Admitting: Obstetrics & Gynecology

## 2020-06-17 ENCOUNTER — Encounter: Payer: Self-pay | Admitting: Obstetrics & Gynecology

## 2020-06-17 ENCOUNTER — Ambulatory Visit: Payer: Medicare Other | Admitting: Obstetrics & Gynecology

## 2020-06-17 ENCOUNTER — Other Ambulatory Visit: Payer: Self-pay

## 2020-06-17 VITALS — BP 131/56 | HR 65 | Ht 66.0 in | Wt 195.0 lb

## 2020-06-17 DIAGNOSIS — N993 Prolapse of vaginal vault after hysterectomy: Secondary | ICD-10-CM

## 2020-06-17 DIAGNOSIS — Z4689 Encounter for fitting and adjustment of other specified devices: Secondary | ICD-10-CM | POA: Diagnosis not present

## 2020-06-17 DIAGNOSIS — R339 Retention of urine, unspecified: Secondary | ICD-10-CM

## 2020-06-17 NOTE — Progress Notes (Signed)
Chief Complaint  Patient presents with  . Follow-up    On pessary    Blood pressure (!) 131/56, pulse 65, height 5' 6"  (1.676 m), weight 195 lb (88.5 kg).  [name] presents today for routine follow up related to her pessary.   She uses a Gelhorn pessary 2 3/4 inch She reports no vaginal discharge and no vaginal bleeding   Likert scale(1 not bothersome -5 very bothersome)  :  1  Exam reveals no undue vaginal mucosal pressure of breakdown, no discharge and no vaginal bleeding.  Vaginal Epithelial Abnormality Classification System:   0 0    No abnormalities 1    Epithelial erythema 2    Granulation tissue 3    Epithelial break or erosion, 1 cm or less 4    Epithelial break or erosion, 1 cm or greater  The pessary is removed, cleaned and replaced without difficulty.    No diagnosis found.   [name] will be sen back in 4 months for continued follow up.  Florian Buff, MD  06/17/2020 1:59 PM

## 2020-07-16 ENCOUNTER — Emergency Department (HOSPITAL_COMMUNITY): Payer: Medicare Other

## 2020-07-16 ENCOUNTER — Other Ambulatory Visit: Payer: Self-pay

## 2020-07-16 ENCOUNTER — Emergency Department (HOSPITAL_COMMUNITY)
Admission: EM | Admit: 2020-07-16 | Discharge: 2020-07-16 | Disposition: A | Discharge status: Home / Self Care | Payer: Medicare Other | Attending: Emergency Medicine | Admitting: Emergency Medicine

## 2020-07-16 DIAGNOSIS — B029 Zoster without complications: Secondary | ICD-10-CM | POA: Insufficient documentation

## 2020-07-16 DIAGNOSIS — E559 Vitamin D deficiency, unspecified: Secondary | ICD-10-CM | POA: Diagnosis not present

## 2020-07-16 DIAGNOSIS — Z794 Long term (current) use of insulin: Secondary | ICD-10-CM | POA: Diagnosis not present

## 2020-07-16 DIAGNOSIS — I129 Hypertensive chronic kidney disease with stage 1 through stage 4 chronic kidney disease, or unspecified chronic kidney disease: Secondary | ICD-10-CM | POA: Insufficient documentation

## 2020-07-16 DIAGNOSIS — N183 Chronic kidney disease, stage 3 unspecified: Secondary | ICD-10-CM | POA: Insufficient documentation

## 2020-07-16 DIAGNOSIS — E11319 Type 2 diabetes mellitus with unspecified diabetic retinopathy without macular edema: Secondary | ICD-10-CM | POA: Diagnosis not present

## 2020-07-16 DIAGNOSIS — E1122 Type 2 diabetes mellitus with diabetic chronic kidney disease: Secondary | ICD-10-CM | POA: Insufficient documentation

## 2020-07-16 DIAGNOSIS — R0789 Other chest pain: Secondary | ICD-10-CM | POA: Diagnosis not present

## 2020-07-16 DIAGNOSIS — E039 Hypothyroidism, unspecified: Secondary | ICD-10-CM | POA: Insufficient documentation

## 2020-07-16 DIAGNOSIS — R21 Rash and other nonspecific skin eruption: Secondary | ICD-10-CM | POA: Diagnosis present

## 2020-07-16 DIAGNOSIS — Z79899 Other long term (current) drug therapy: Secondary | ICD-10-CM | POA: Insufficient documentation

## 2020-07-16 DIAGNOSIS — R079 Chest pain, unspecified: Secondary | ICD-10-CM | POA: Diagnosis not present

## 2020-07-16 DIAGNOSIS — E782 Mixed hyperlipidemia: Secondary | ICD-10-CM | POA: Diagnosis not present

## 2020-07-16 DIAGNOSIS — J811 Chronic pulmonary edema: Secondary | ICD-10-CM | POA: Diagnosis not present

## 2020-07-16 LAB — BASIC METABOLIC PANEL
Anion gap: 9 (ref 5–15)
BUN: 36 mg/dL — ABNORMAL HIGH (ref 8–23)
CO2: 25 mmol/L (ref 22–32)
Calcium: 8.4 mg/dL — ABNORMAL LOW (ref 8.9–10.3)
Chloride: 105 mmol/L (ref 98–111)
Creatinine, Ser: 1.35 mg/dL — ABNORMAL HIGH (ref 0.44–1.00)
GFR, Estimated: 43 mL/min — ABNORMAL LOW (ref >60)
Glucose, Bld: 145 mg/dL — ABNORMAL HIGH (ref 70–99)
Potassium: 3.5 mmol/L (ref 3.5–5.1)
Sodium: 139 mmol/L (ref 135–145)

## 2020-07-16 LAB — CBC
HCT: 32.8 % — ABNORMAL LOW (ref 36.0–46.0)
Hemoglobin: 10.7 g/dL — ABNORMAL LOW (ref 12.0–15.0)
MCH: 32.2 pg (ref 26.0–34.0)
MCHC: 32.6 g/dL (ref 30.0–36.0)
MCV: 98.8 fL (ref 80.0–100.0)
Platelets: 48 10*3/uL — ABNORMAL LOW (ref 150–400)
RBC: 3.32 MIL/uL — ABNORMAL LOW (ref 3.87–5.11)
RDW: 14.9 % (ref 11.5–15.5)
WBC: 3.5 10*3/uL — ABNORMAL LOW (ref 4.0–10.5)
nRBC: 0 % (ref 0.0–0.2)

## 2020-07-16 LAB — TROPONIN I (HIGH SENSITIVITY): Troponin I (High Sensitivity): 9 ng/L (ref <18)

## 2020-07-16 MED ORDER — VALACYCLOVIR HCL 1 G PO TABS: 1000.0000 mg | ORAL_TABLET | Freq: Three times a day (TID) | ORAL | 0 refills | Status: DC

## 2020-07-16 MED ORDER — NAPROXEN 500 MG PO TABS: 500.0000 mg | ORAL_TABLET | Freq: Two times a day (BID) | ORAL | 0 refills | Status: DC

## 2020-07-16 MED ORDER — VALACYCLOVIR HCL 500 MG PO TABS
1000.0000 mg | ORAL_TABLET | Freq: Every day | ORAL | Status: DC
  Administered 2020-07-16: 1000 mg via ORAL
  Filled 2020-07-16: qty 2

## 2020-07-16 NOTE — ED Provider Notes (Signed)
[facility] Provider Note   CSN: [id number] Arrival date & time: 07/16/20  1400     History Chief Complaint  Patient presents with  . Chest Pain    [name] is a 70 y.o. female.  HPI   This patient is a 70 year old female, she has a history of diabetes hypertension and hypercholesterolemia, she is taking medications for these things, she has thrombocytopenia.  She reports that starting on Friday approximately 5 days ago she developed a pain in the left side of her chest, this is since started to radiate to her back and her shoulder and is associated with a red vesicular painful rash in that same distribution from her sternum all the way around to her back.  She has some chronic swelling of her legs, she denies shortness of breath or fevers, she had some nausea earlier today but not at this time.  She has not seen her family doctor for this, it is continuous, worsening, has now become associated with some discomfort in the rash, she does recall having chickenpox as a child  Past Medical History:  Diagnosis Date  . Alkaline phosphatase elevation   . Anemia   . ASD (atrial septal defect)   . Cataract    both eyes hx of  . Chest pain    03-17-2013 last chest pain  . Chronic kidney disease    Stage III kidney disease  . Cirrhosis ([place])   . Depression   . Diabetes mellitus type II   . Fatigue   . GERD (gastroesophageal reflux disease)   . Gout   . Headache(784.0)    occasional  . Hyperlipidemia   . Hypothyroidism   . Neuropathy   . Peripheral neuropathy   . Retinopathy     Patient Active Problem List   Diagnosis Date Noted  . Thrombocytopenia ([place]) 07/30/2017  . CKD stage 3 due to type 2 diabetes mellitus ([place]) 06/30/2017  . Liver cirrhosis secondary to NASH ([place]) 06/30/2017  . Hypertension with renal disease 03/19/2017  . 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/19/2017  . Tremor 08/06/2016  . Vitreous hemorrhage of left eye ([place])  01/29/2015  . Diabetic macular edema ([place]) 12/25/2014  . Hypertensive retinopathy of both eyes 12/25/2014  . Cholecystitis 04/05/2013  . Proliferative diabetic retinopathy ([place]) 08/04/2011  . Bilateral nondiabetic proliferative retinopathy 04/03/2011  . Type 2 diabetes mellitus with diabetic chronic kidney disease ([place]) 02/05/2010  . CHEST PAIN 02/05/2010  . Hypothyroidism 03/20/2008  . HYPERLIPIDEMIA 03/20/2008  . RENAL DISEASE, CHRONIC, STAGE II 03/20/2008  . DEPRESSION 02/21/2008  . PERIPHERAL NEUROPATHY 02/21/2008  . Pseudophakia of both eyes 02/21/2008  . GERD 02/21/2008    Past Surgical History:  Procedure Laterality Date  . APPENDECTOMY    . CATARACT EXTRACTION    . CERVICAL SPINE SURGERY  2006  . CHOLECYSTECTOMY N/A 04/07/2013   Procedure: LAPAROSCOPIC CHOLECYSTECTOMY WITH INTRAOPERATIVE CHOLANGIOGRAM;  Surgeon: Merrie Roof, MD;  Location: [facility];  Service: General;  Laterality: N/A;  . COMBINED HYSTERECTOMY VAGINAL / OOPHORECTOMY / A&P REPAIR  1987   Unilateral oophorectomy, h/o uterine prolapse has right ovary  . ERCP N/A 04/06/2013   Procedure: ENDOSCOPIC RETROGRADE CHOLANGIOPANCREATOGRAPHY (ERCP);  Surgeon: Beryle Beams, MD;  Location: [facility];  Service: Endoscopy;  Laterality: N/A;  . EUS N/A 03/31/2013   Procedure: UPPER ENDOSCOPIC ULTRASOUND (EUS) LINEAR;  Surgeon: Beryle Beams, MD;  Location: [facility];  Service: Endoscopy;  Laterality: N/A;  .  REFRACTIVE SURGERY    . SPINAL FUSION    . TONSILLECTOMY  age 29     OB History    Gravida  3   Para  3   Term  3   Preterm      AB      Living        SAB      IAB      Ectopic      Multiple      Live Births              Family History  Problem Relation Age of Onset  . COPD Mother   . Lung cancer Father   . Diabetes Sister   . Cataracts Sister   . Insulin resistance Daughter   . Insulin resistance Son     Social History   Tobacco Use  . Smoking status: Never Smoker  .  Smokeless tobacco: Never Used  Vaping Use  . Vaping Use: Never used  Substance Use Topics  . Alcohol use: No  . Drug use: No    Home Medications Prior to Admission medications   Medication Sig Start Date End Date Taking? Authorizing Provider  allopurinol (ZYLOPRIM) 100 MG tablet Take 100 mg by mouth every evening.    Yes [provider]  cholecalciferol (VITAMIN D3) 25 MCG (1000 UNIT) tablet Take 1,000 Units by mouth See admin instructions. Take 1 tablet every Wednesday.   Yes [provider]  furosemide (LASIX) 40 MG tablet Take 40 mg by mouth daily.   Yes [provider]  hydrOXYzine (ATARAX/VISTARIL) 25 MG tablet Take 25 mg by mouth 3 (three) times daily as needed for anxiety or itching (Helps with neuropathy).    Yes [provider]  insulin regular (NOVOLIN R,HUMULIN R) 100 units/mL injection Inject 5 Units into the skin 2 (two) times daily before a meal.   Yes [provider]  levothyroxine (SYNTHROID) 137 MCG tablet Take 137 mcg by mouth daily. 12/19/18  Yes [provider]  Multiple Vitamins-Minerals (PRESERVISION AREDS 2) CAPS Take 1 capsule by mouth daily. Every other day   Yes [provider]  naproxen (NAPROSYN) 500 MG tablet Take 1 tablet (500 mg total) by mouth 2 (two) times daily with a meal. 07/16/20  Yes Noemi Chapel, MD  NOVOLIN N RELION 100 UNIT/ML injection 12-14 Units 2 (two) times daily before a meal. 09/14/19  Yes [provider]  valACYclovir (VALTREX) 1000 MG tablet Take 1 tablet (1,000 mg total) by mouth 3 (three) times daily. 07/16/20  Yes Noemi Chapel, MD  Vitamin D, Ergocalciferol, (DRISDOL) 1.25 MG (50000 UT) CAPS capsule Take 50,000 Units by mouth once a week. 11/16/18  Yes [provider]  ONETOUCH ULTRA test strip  12/19/18   [provider]    Allergies    Statins and Dilaudid [hydromorphone hcl]  Review of Systems   Review of Systems  All other systems reviewed and  are negative.   Physical Exam Updated Vital Signs BP (!) 146/54   Pulse 72   Resp 14   Ht 1.676 m (5' 6" )   Wt 85.7 kg   SpO2 100%   BMI 30.51 kg/m   Physical Exam Vitals and nursing note reviewed.  Constitutional:      General: She is not in acute distress.    Appearance: She is well-developed.  HENT:     Head: Normocephalic and atraumatic.     Mouth/Throat:     Pharynx: No  oropharyngeal exudate.  Eyes:     General: No scleral icterus.       Right eye: No discharge.        Left eye: No discharge.     Conjunctiva/sclera: Conjunctivae normal.     Pupils: Pupils are equal, round, and reactive to light.  Neck:     Thyroid: No thyromegaly.     Vascular: No JVD.  Cardiovascular:     Rate and Rhythm: Normal rate and regular rhythm.     Heart sounds: Normal heart sounds. No murmur heard. No friction rub. No gallop.      Comments: Normal heart sounds and peripheral pulses Pulmonary:     Effort: Pulmonary effort is normal. No respiratory distress.     Breath sounds: Normal breath sounds. No wheezing or rales.  Abdominal:     General: Bowel sounds are normal. There is no distension.     Palpations: Abdomen is soft. There is no mass.     Tenderness: There is no abdominal tenderness.  Musculoskeletal:        General: No tenderness. Normal range of motion.     Cervical back: Normal range of motion and neck supple.  Lymphadenopathy:     Cervical: No cervical adenopathy.  Skin:    General: Skin is warm and dry.     Findings: Rash present. No erythema.     Comments: Vesicular red rash in clusters starting at the sternum - across the L breast and L upper back - does not cross midline  Neurological:     General: No focal deficit present.     Mental Status: She is alert.     Coordination: Coordination normal.     Comments: Clear speech, normal CN's  Psychiatric:        Behavior: Behavior normal.     ED Results / Procedures / Treatments   Labs (all labs ordered are  listed, but only abnormal results are displayed) Labs Reviewed  BASIC METABOLIC PANEL - Abnormal; Notable for the following components:      Result Value   Glucose, Bld 145 (*)    BUN 36 (*)    Creatinine, Ser 1.35 (*)    Calcium 8.4 (*)    GFR, Estimated 43 (*)    All other components within normal limits  CBC - Abnormal; Notable for the following components:   WBC 3.5 (*)    RBC 3.32 (*)    Hemoglobin 10.7 (*)    HCT 32.8 (*)    Platelets 48 (*)    All other components within normal limits  TROPONIN I (HIGH SENSITIVITY)    EKG EKG Interpretation  Date/Time:  Tuesday July 16 2020 14:13:36 EDT Ventricular Rate:  78 PR Interval:  176 QRS Duration: 138 QT Interval:  438 QTC Calculation: 499 R Axis:   -46 Text Interpretation: Normal sinus rhythm Right bundle branch block Left anterior fascicular block ** Bifascicular block ** Left ventricular hypertrophy with repolarization abnormality ( R in aVL ) Cannot rule out Septal infarct , age undetermined Abnormal ECG similar to Dec 2017 Confirmed by Sherwood Gambler [phone]) on 07/16/2020 2:41:55 PM   Radiology DG Chest 2 View  Result Date: 07/16/2020 CLINICAL DATA:  Chest pain. EXAM: CHEST - 2 VIEW COMPARISON:  Chest x-ray 03/03/2016, CT chest 03/15/2013, chest x-ray 07/19/2014 FINDINGS: Slightly more prominent ascending aorta and aortic arch which may be due to an AP technique. Similarly more prominent cardiac silhouette. Prominent hilar vasculature. No focal consolidation. Slightly more prominent  increased interstitial markings. No pleural effusion. No pneumothorax. No acute osseous abnormality.  Cervical surgical changes. IMPRESSION: 1. Slightly more prominent ascending aorta and aortic arch which may be due to an AP technique. 2. Mild pulmonary edema. Electronically Signed   By: Iven Finn M.D.   On: 07/16/2020 15:41    Procedures Procedures   Medications Ordered in ED Medications  valACYclovir (VALTREX) tablet 1,000 mg  (1,000 mg Oral Given 07/16/20 1527)    ED Course  I have reviewed the triage vital signs and the nursing notes.  Pertinent labs & imaging results that were available during my care of the patient were reviewed by me and considered in my medical decision making (see chart for details).    MDM Rules/Calculators/A&P                          EKG normal - RBBB but no ischemia Has Zoster - will treat with Valtrex r/o ACS as well.  Troponin is normal, blood counts are unremarkable, kidney function seems to be close to baseline, hemoglobin is at baseline as his white blood cell count, she is chronically thrombocytopenic and this is not new either.  Troponin being negative I do not think she needs a second given pain for 5 days.  EKG is nonischemic, patient agreeable to the plan, this fits with shingles neuropathy  The patient was given instructions on her level of contagiousness and precautions and expressed understanding  Final Clinical Impression(s) / ED Diagnoses Final diagnoses:  Herpes zoster without complication    Rx / DC Orders ED Discharge Orders         Ordered    valACYclovir (VALTREX) 1000 MG tablet  3 times daily        07/16/20 1619    naproxen (NAPROSYN) 500 MG tablet  2 times daily with meals        07/16/20 1619           Noemi Chapel, MD 07/16/20 1621

## 2020-07-16 NOTE — Discharge Instructions (Addendum)
Shingles pain may last for up to 1 month or sometimes even longer.  The rash will crust over within the next week.  You will be contagious until the rash crusts over. Please take Valtrex 3 times a day, this will help with the rash and the virus but it will not take all of your symptoms away Take naproxen twice a day as needed for pain Follow-up with your family doctor within 1 week if you are not having significant improvement Read the attached instructions about the shingles

## 2020-07-16 NOTE — ED Triage Notes (Signed)
Pt c/o cp since Friday. Pt states it radiates to back and left shoulder.

## 2020-07-18 DIAGNOSIS — K219 Gastro-esophageal reflux disease without esophagitis: Secondary | ICD-10-CM | POA: Diagnosis not present

## 2020-07-18 DIAGNOSIS — N189 Chronic kidney disease, unspecified: Secondary | ICD-10-CM | POA: Diagnosis not present

## 2020-07-18 DIAGNOSIS — E559 Vitamin D deficiency, unspecified: Secondary | ICD-10-CM | POA: Diagnosis not present

## 2020-07-18 DIAGNOSIS — K5900 Constipation, unspecified: Secondary | ICD-10-CM | POA: Diagnosis not present

## 2020-07-18 DIAGNOSIS — M109 Gout, unspecified: Secondary | ICD-10-CM | POA: Diagnosis not present

## 2020-07-18 DIAGNOSIS — J309 Allergic rhinitis, unspecified: Secondary | ICD-10-CM | POA: Diagnosis not present

## 2020-07-18 DIAGNOSIS — D696 Thrombocytopenia, unspecified: Secondary | ICD-10-CM | POA: Diagnosis not present

## 2020-07-18 DIAGNOSIS — E039 Hypothyroidism, unspecified: Secondary | ICD-10-CM | POA: Diagnosis not present

## 2020-07-18 DIAGNOSIS — E11319 Type 2 diabetes mellitus with unspecified diabetic retinopathy without macular edema: Secondary | ICD-10-CM | POA: Diagnosis not present

## 2020-07-18 DIAGNOSIS — K7469 Other cirrhosis of liver: Secondary | ICD-10-CM | POA: Diagnosis not present

## 2020-07-18 DIAGNOSIS — E782 Mixed hyperlipidemia: Secondary | ICD-10-CM | POA: Diagnosis not present

## 2020-07-24 DIAGNOSIS — L309 Dermatitis, unspecified: Secondary | ICD-10-CM | POA: Diagnosis not present

## 2020-07-24 DIAGNOSIS — R0602 Shortness of breath: Secondary | ICD-10-CM | POA: Diagnosis not present

## 2020-07-24 DIAGNOSIS — B029 Zoster without complications: Secondary | ICD-10-CM | POA: Diagnosis not present

## 2020-07-24 DIAGNOSIS — R3912 Poor urinary stream: Secondary | ICD-10-CM | POA: Diagnosis not present

## 2020-07-24 DIAGNOSIS — E039 Hypothyroidism, unspecified: Secondary | ICD-10-CM | POA: Diagnosis not present

## 2020-07-24 DIAGNOSIS — K219 Gastro-esophageal reflux disease without esophagitis: Secondary | ICD-10-CM | POA: Diagnosis not present

## 2020-07-24 DIAGNOSIS — Z0001 Encounter for general adult medical examination with abnormal findings: Secondary | ICD-10-CM | POA: Diagnosis not present

## 2020-07-24 DIAGNOSIS — N189 Chronic kidney disease, unspecified: Secondary | ICD-10-CM | POA: Diagnosis not present

## 2020-07-24 DIAGNOSIS — K7469 Other cirrhosis of liver: Secondary | ICD-10-CM | POA: Diagnosis not present

## 2020-07-24 DIAGNOSIS — R2243 Localized swelling, mass and lump, lower limb, bilateral: Secondary | ICD-10-CM | POA: Diagnosis not present

## 2020-07-24 DIAGNOSIS — D696 Thrombocytopenia, unspecified: Secondary | ICD-10-CM | POA: Diagnosis not present

## 2020-07-29 ENCOUNTER — Other Ambulatory Visit: Payer: Self-pay

## 2020-07-29 ENCOUNTER — Encounter (INDEPENDENT_AMBULATORY_CARE_PROVIDER_SITE_OTHER): Payer: Medicare Other | Admitting: Ophthalmology

## 2020-07-29 DIAGNOSIS — E113512 Type 2 diabetes mellitus with proliferative diabetic retinopathy with macular edema, left eye: Secondary | ICD-10-CM | POA: Diagnosis not present

## 2020-07-29 DIAGNOSIS — H43813 Vitreous degeneration, bilateral: Secondary | ICD-10-CM | POA: Diagnosis not present

## 2020-07-29 DIAGNOSIS — E113391 Type 2 diabetes mellitus with moderate nonproliferative diabetic retinopathy without macular edema, right eye: Secondary | ICD-10-CM | POA: Diagnosis not present

## 2020-07-30 DIAGNOSIS — Z86010 Personal history of colonic polyps: Secondary | ICD-10-CM | POA: Diagnosis not present

## 2020-07-30 DIAGNOSIS — K7581 Nonalcoholic steatohepatitis (NASH): Secondary | ICD-10-CM | POA: Diagnosis not present

## 2020-08-05 ENCOUNTER — Other Ambulatory Visit: Payer: Self-pay | Admitting: Gastroenterology

## 2020-08-07 ENCOUNTER — Inpatient Hospital Stay (HOSPITAL_COMMUNITY): Payer: Medicare Other

## 2020-08-09 ENCOUNTER — Other Ambulatory Visit (HOSPITAL_COMMUNITY): Payer: Self-pay | Admitting: Internal Medicine

## 2020-08-09 DIAGNOSIS — Z1231 Encounter for screening mammogram for malignant neoplasm of breast: Secondary | ICD-10-CM

## 2020-08-13 ENCOUNTER — Other Ambulatory Visit: Payer: Self-pay

## 2020-08-13 ENCOUNTER — Other Ambulatory Visit (HOSPITAL_COMMUNITY)
Admission: RE | Admit: 2020-08-13 | Discharge: 2020-08-13 | Disposition: A | Discharge status: Home / Self Care | Payer: Medicare Other | Source: Ambulatory Visit | Attending: Gastroenterology | Admitting: Gastroenterology

## 2020-08-13 ENCOUNTER — Encounter (HOSPITAL_COMMUNITY): Payer: Self-pay | Admitting: Gastroenterology

## 2020-08-13 DIAGNOSIS — Z01812 Encounter for preprocedural laboratory examination: Secondary | ICD-10-CM | POA: Insufficient documentation

## 2020-08-13 DIAGNOSIS — Z20822 Contact with and (suspected) exposure to covid-19: Secondary | ICD-10-CM | POA: Insufficient documentation

## 2020-08-13 LAB — SARS CORONAVIRUS 2 (TAT 6-24 HRS): SARS Coronavirus 2: NEGATIVE

## 2020-08-13 NOTE — Progress Notes (Signed)
Attempted to obtain medical history via telephone, unable to reach at this time. I left a voicemail to return pre surgical testing department's phone call.  

## 2020-08-14 ENCOUNTER — Ambulatory Visit (HOSPITAL_COMMUNITY): Payer: Medicare Other | Admitting: Hematology

## 2020-08-16 ENCOUNTER — Other Ambulatory Visit: Payer: Self-pay

## 2020-08-16 ENCOUNTER — Encounter (HOSPITAL_COMMUNITY)
Admission: RE | Disposition: A | Discharge status: Home / Self Care | Payer: Self-pay | Source: Ambulatory Visit | Attending: Gastroenterology

## 2020-08-16 ENCOUNTER — Ambulatory Visit (HOSPITAL_COMMUNITY)
Admission: RE | Admit: 2020-08-16 | Discharge: 2020-08-16 | Disposition: A | Discharge status: Home / Self Care | Payer: Medicare Other | Source: Ambulatory Visit | Attending: Gastroenterology | Admitting: Gastroenterology

## 2020-08-16 ENCOUNTER — Ambulatory Visit (HOSPITAL_COMMUNITY): Payer: Medicare Other | Admitting: Anesthesiology

## 2020-08-16 DIAGNOSIS — K7460 Unspecified cirrhosis of liver: Secondary | ICD-10-CM | POA: Diagnosis not present

## 2020-08-16 DIAGNOSIS — K7469 Other cirrhosis of liver: Secondary | ICD-10-CM | POA: Insufficient documentation

## 2020-08-16 DIAGNOSIS — Z8379 Family history of other diseases of the digestive system: Secondary | ICD-10-CM | POA: Diagnosis not present

## 2020-08-16 DIAGNOSIS — Z800 Family history of malignant neoplasm of digestive organs: Secondary | ICD-10-CM | POA: Insufficient documentation

## 2020-08-16 DIAGNOSIS — I8500 Esophageal varices without bleeding: Secondary | ICD-10-CM | POA: Diagnosis not present

## 2020-08-16 DIAGNOSIS — K7581 Nonalcoholic steatohepatitis (NASH): Secondary | ICD-10-CM | POA: Diagnosis not present

## 2020-08-16 DIAGNOSIS — Z794 Long term (current) use of insulin: Secondary | ICD-10-CM | POA: Insufficient documentation

## 2020-08-16 DIAGNOSIS — K3189 Other diseases of stomach and duodenum: Secondary | ICD-10-CM | POA: Diagnosis not present

## 2020-08-16 DIAGNOSIS — I8510 Secondary esophageal varices without bleeding: Secondary | ICD-10-CM | POA: Diagnosis not present

## 2020-08-16 DIAGNOSIS — E785 Hyperlipidemia, unspecified: Secondary | ICD-10-CM | POA: Diagnosis not present

## 2020-08-16 DIAGNOSIS — K766 Portal hypertension: Secondary | ICD-10-CM | POA: Diagnosis not present

## 2020-08-16 HISTORY — DX: Presence of urogenital implants: Z96.0

## 2020-08-16 HISTORY — PX: ESOPHAGOGASTRODUODENOSCOPY (EGD) WITH PROPOFOL: SHX5813

## 2020-08-16 LAB — GLUCOSE, CAPILLARY: Glucose-Capillary: 142 mg/dL — ABNORMAL HIGH (ref 70–99)

## 2020-08-16 SURGERY — ESOPHAGOGASTRODUODENOSCOPY (EGD) WITH PROPOFOL
Anesthesia: Monitor Anesthesia Care

## 2020-08-16 MED ORDER — LACTATED RINGERS IV SOLN: INTRAVENOUS | Status: DC | PRN

## 2020-08-16 MED ORDER — SODIUM CHLORIDE 0.9 % IV SOLN: INTRAVENOUS | Status: DC

## 2020-08-16 MED ORDER — PROPOFOL 500 MG/50ML IV EMUL
INTRAVENOUS | Status: AC
  Filled 2020-08-16: qty 50

## 2020-08-16 MED ORDER — PROPOFOL 500 MG/50ML IV EMUL
INTRAVENOUS | Status: DC | PRN
  Administered 2020-08-16: 125 ug/kg/min via INTRAVENOUS

## 2020-08-16 MED ORDER — PROPOFOL 10 MG/ML IV BOLUS
INTRAVENOUS | Status: DC | PRN
  Administered 2020-08-16 (×2): 10 mg via INTRAVENOUS

## 2020-08-16 MED ORDER — LIDOCAINE 2% (20 MG/ML) 5 ML SYRINGE
INTRAMUSCULAR | Status: DC | PRN
  Administered 2020-08-16: 50 mg via INTRAVENOUS

## 2020-08-16 SURGICAL SUPPLY — 15 items

## 2020-08-16 NOTE — Op Note (Addendum)
[facility] Patient Name: [name] Procedure Date: 08/16/2020 MRN: [id number] Attending MD: Carol Ada , MD Date of Birth: 22-Jul-1950 CSN: [id number] Age: 70 Admit Type: Outpatient Procedure:                Upper GI endoscopy Indications:              Variceal screening (no known varices or prior                            bleeding) Providers:                Carol Ada, MD, Baird Cancer, RN, Laverda Sorenson,                            Technician, Karis Juba, CRNA Referring MD:              Medicines:                Propofol per Anesthesia Complications:            No immediate complications. Estimated Blood Loss:     Estimated blood loss: none. Procedure:                Pre-Anesthesia Assessment:                           - Prior to the procedure, a History and Physical                            was performed, and patient medications and                            allergies were reviewed. The patient's tolerance of                            previous anesthesia was also reviewed. The risks                            and benefits of the procedure and the sedation                            options and risks were discussed with the patient.                            All questions were answered, and informed consent                            was obtained. Prior Anticoagulants: The patient has                            taken no previous anticoagulant or antiplatelet                            agents. ASA Grade Assessment: III - A patient with  severe systemic disease. After reviewing the risks                            and benefits, the patient was deemed in                            satisfactory condition to undergo the procedure.                           - Sedation was administered by an anesthesia                            professional. Deep sedation was attained.                           After obtaining informed consent,  the endoscope was                            passed under direct vision. Throughout the                            procedure, the patient's blood pressure, pulse, and                            oxygen saturations were monitored continuously. The                            [id number]-[id number] ([id number]) was introduced through the                            mouth, and advanced to the second part of duodenum.                            The upper GI endoscopy was accomplished without                            difficulty. The patient tolerated the procedure                            well. Scope In: Scope Out: Findings:      Grade II varices were found in the middle third of the esophagus and in       the lower third of the esophagus. They were medium in size.      Mild portal hypertensive gastropathy was found in the gastric fundus and       in the gastric body.      The examined duodenum was normal.      Medium-sized varices were identified. A representative image was not       possible as the esophagus contracted frequently. An image of the       variceal vessels flattening was attempted, but inadequate with the image       capture. There were no red wale signs. Impression:               - Grade II esophageal varices.                           -  Portal hypertensive gastropathy.                           - Normal examined duodenum.                           - No specimens collected. Moderate Sedation:      Not Applicable - Patient had care per Anesthesia. Recommendation:           - Patient has a contact number available for                            emergencies. The signs and symptoms of potential                            delayed complications were discussed with the                            patient. Return to normal activities tomorrow.                            Written discharge instructions were provided to the                            patient.                           - Resume  previous diet.                           - Continue present medications.                           - Return to GI clinic in 4 weeks with Dr. Collene Mares.                           - Start nadolol 40 mg QD                           - Repeat EGD in 1 year for surveillance. Procedure Code(s):        --- Professional ---                           [phone], Esophagogastroduodenoscopy, flexible,                            transoral; diagnostic, including collection of                            specimen(s) by brushing or washing, when performed                            (separate procedure) Diagnosis Code(s):        --- Professional ---                           I85.00, Esophageal varices  without bleeding                           K76.6, Portal hypertension                           K31.89, Other diseases of stomach and duodenum                           Z13.810, Encounter for screening for upper                            gastrointestinal disorder CPT copyright 2019 [employer]. All rights reserved. The codes documented in this report are preliminary and upon coder review may  be revised to meet current compliance requirements. Carol Ada, MD Carol Ada, MD 08/16/2020 2:13:33 PM This report has been signed electronically. Number of Addenda: 0

## 2020-08-16 NOTE — Anesthesia Preprocedure Evaluation (Addendum)
Anesthesia Evaluation  Patient identified by MRN, date of birth, ID band Patient awake    Reviewed: Allergy & Precautions, NPO status , Patient's Chart, lab work & pertinent test results  Airway Mallampati: I       Dental no notable dental hx.    Pulmonary    Pulmonary exam normal        Cardiovascular Normal cardiovascular exam     Neuro/Psych  Neuromuscular disease    GI/Hepatic (+) Cirrhosis       , Hepatitis -, Unspecified  Endo/Other  diabetes, Type 2, Insulin DependentHypothyroidism   Renal/GU Renal diseaseCKD III     Musculoskeletal   Abdominal Normal abdominal exam  (+)   Peds  Hematology   Anesthesia Other Findings   Reproductive/Obstetrics                             Anesthesia Physical Anesthesia Plan  ASA: II  Anesthesia Plan: MAC   Post-op Pain Management:    Induction:   PONV Risk Score and Plan: 2 and Treatment may vary due to age or medical condition  Airway Management Planned: Natural Airway and Mask  Additional Equipment: None  Intra-op Plan:   Post-operative Plan:   Informed Consent: I have reviewed the patients History and Physical, chart, labs and discussed the procedure including the risks, benefits and alternatives for the proposed anesthesia with the patient or authorized representative who has indicated his/her understanding and acceptance.     Dental advisory given  Plan Discussed with: CRNA  Anesthesia Plan Comments:         Anesthesia Quick Evaluation

## 2020-08-16 NOTE — Anesthesia Postprocedure Evaluation (Signed)
Anesthesia Post Note  Patient: [name]  Procedure(s) Performed: ESOPHAGOGASTRODUODENOSCOPY (EGD) WITH PROPOFOL (N/A )     Patient location during evaluation: Endoscopy Anesthesia Type: MAC Level of consciousness: awake Pain management: pain level controlled Vital Signs Assessment: post-procedure vital signs reviewed and stable Respiratory status: spontaneous breathing, nonlabored ventilation, respiratory function stable and patient connected to nasal cannula oxygen Cardiovascular status: stable and blood pressure returned to baseline Postop Assessment: no apparent nausea or vomiting Anesthetic complications: no   No complications documented.  Last Vitals:  Vitals:   08/16/20 1410 08/16/20 1420  BP: (!) 143/56 (!) 161/62  Pulse: 63 67  Resp: 17 14  Temp:    SpO2: 98% 97%    Last Pain:  Vitals:   08/16/20 1420  TempSrc:   PainSc: 0-No pain                 [name]

## 2020-08-16 NOTE — H&P (Signed)
[name]   HPI: This 70 year old white female presents to the office for a 6 month follow up. She has been followed for NASH cirrhosis which was diagnosed in 2018. She is taking Spironolactone 100 mg per day and Furosemide 40 mg per day. She has 1 BM per day with no obvious blood or mucus in the stool. She has good appetite and has lost 11 pounds since July, 2021. She denies having any complaints of abdominal pain, nausea, vomiting, acid reflux, dysphagia or odynophagia. She denies having a family history of celiac sprue. Her maternal aunt has Crohn's disease. her paternal grandfather had colon cancer but the age of diagnosis is not known. Her last colonoscopy was done on 01/18/2017 when a tubular adenoma was removed from the ascending colon.    Past Medical History:  Diagnosis Date  . Alkaline phosphatase elevation   . Anemia   . ASD (atrial septal defect)   . Cataract    both eyes hx of  . Chest pain    03-17-2013 last chest pain  . Chronic kidney disease    Stage III kidney disease  . Cirrhosis ([place])   . Depression   . Diabetes mellitus type II   . Fatigue   . GERD (gastroesophageal reflux disease)   . Gout   . Headache(784.0)    occasional  . Hyperlipidemia   . Hypothyroidism   . Neuropathy   . Peripheral neuropathy   . Presence of pessary   . Retinopathy     Past Surgical History:  Procedure Laterality Date  . APPENDECTOMY    . CATARACT EXTRACTION    . CERVICAL SPINE SURGERY  2006  . CHOLECYSTECTOMY N/A 04/07/2013   Procedure: LAPAROSCOPIC CHOLECYSTECTOMY WITH INTRAOPERATIVE CHOLANGIOGRAM;  Surgeon: Merrie Roof, MD;  Location: [facility];  Service: General;  Laterality: N/A;  . COMBINED HYSTERECTOMY VAGINAL / OOPHORECTOMY / A&P REPAIR  1987   Unilateral oophorectomy, h/o uterine prolapse has right ovary  . ERCP N/A 04/06/2013   Procedure: ENDOSCOPIC RETROGRADE CHOLANGIOPANCREATOGRAPHY (ERCP);  Surgeon: Beryle Beams, MD;  Location: [facility];  Service: Endoscopy;   Laterality: N/A;  . EUS N/A 03/31/2013   Procedure: UPPER ENDOSCOPIC ULTRASOUND (EUS) LINEAR;  Surgeon: Beryle Beams, MD;  Location: [facility];  Service: Endoscopy;  Laterality: N/A;  . REFRACTIVE SURGERY    . SPINAL FUSION    . TONSILLECTOMY  age 65    Family History  Problem Relation Age of Onset  . COPD Mother   . Lung cancer Father   . Diabetes Sister   . Cataracts Sister   . Insulin resistance Daughter   . Insulin resistance Son     Social History:  reports that she has never smoked. She has never used smokeless tobacco. She reports that she does not drink alcohol and does not use drugs.  Allergies:  Allergies  Allergen Reactions  . Statins Other (See Comments)    REACTION: diarrhea/joint pain/muscle aches. Tried at low doses.   . Dilaudid [Hydromorphone Hcl] Nausea And Vomiting    After one vomiting episode no further vomiting    Medications:  Scheduled:  Continuous: . sodium chloride 20 mL/hr at 08/16/20 1331    Results for orders placed or performed during the hospital encounter of 08/16/20 (from the past 24 hour(s))  Glucose, capillary     Status: Abnormal   Collection Time: 08/16/20  1:28 PM  Result Value Ref Range   Glucose-Capillary 142 (H) 70 - 99  mg/dL     No results found.  ROS:  As stated above in the HPI otherwise negative.  Blood pressure (!) 156/54, pulse 68, temperature 98.3 F (36.8 C), temperature source Oral, resp. rate 17, height 5' 6"  (1.676 m), weight 82.6 kg, SpO2 100 %.    PE: Gen: NAD, Alert and Oriented HEENT:  [place]/AT, EOMI Neck: Supple, no LAD Lungs: CTA Bilaterally CV: RRR without M/G/R ABD: Soft, NTND, +BS Ext: No C/C/E  Assessment/Plan: 1) Cirrhosis - EGD to screen for varices.  December Hedtke D 08/16/2020, 1:43 PM

## 2020-08-16 NOTE — Discharge Instructions (Signed)

## 2020-08-16 NOTE — Transfer of Care (Signed)
Immediate Anesthesia Transfer of Care Note  Patient: [name]  Procedure(s) Performed: ESOPHAGOGASTRODUODENOSCOPY (EGD) WITH PROPOFOL (N/A )  Patient Location: PACU and Endoscopy Unit  Anesthesia Type:MAC  Level of Consciousness: awake, alert , oriented and patient cooperative  Airway & Oxygen Therapy: Patient Spontanous Breathing and Patient connected to face mask oxygen  Post-op Assessment: Report given to RN and Post -op Vital signs reviewed and stable  Post vital signs: Reviewed and stable  Last Vitals:  Vitals Value Taken Time  BP 141/50 08/16/20 1405  Temp 36.6 C 08/16/20 1405  Pulse 63 08/16/20 1409  Resp 19 08/16/20 1409  SpO2 100 % 08/16/20 1409  Vitals shown include unvalidated device data.  Last Pain:  Vitals:   08/16/20 1405  TempSrc: Oral  PainSc: 0-No pain         Complications: No complications documented.

## 2020-08-19 ENCOUNTER — Encounter (HOSPITAL_COMMUNITY): Payer: Self-pay | Admitting: Gastroenterology

## 2020-08-21 ENCOUNTER — Inpatient Hospital Stay (HOSPITAL_COMMUNITY): Payer: Medicare Other | Attending: Hematology

## 2020-08-21 ENCOUNTER — Other Ambulatory Visit: Payer: Self-pay

## 2020-08-21 DIAGNOSIS — D696 Thrombocytopenia, unspecified: Secondary | ICD-10-CM | POA: Insufficient documentation

## 2020-08-21 DIAGNOSIS — D509 Iron deficiency anemia, unspecified: Secondary | ICD-10-CM | POA: Diagnosis not present

## 2020-08-21 DIAGNOSIS — D500 Iron deficiency anemia secondary to blood loss (chronic): Secondary | ICD-10-CM

## 2020-08-21 LAB — FERRITIN: Ferritin: 52 ng/mL (ref 11–307)

## 2020-08-21 LAB — COMPREHENSIVE METABOLIC PANEL
ALT: 23 U/L (ref 0–44)
AST: 36 U/L (ref 15–41)
Albumin: 3.3 g/dL — ABNORMAL LOW (ref 3.5–5.0)
Alkaline Phosphatase: 111 U/L (ref 38–126)
Anion gap: 7 (ref 5–15)
BUN: 28 mg/dL — ABNORMAL HIGH (ref 8–23)
CO2: 27 mmol/L (ref 22–32)
Calcium: 8.7 mg/dL — ABNORMAL LOW (ref 8.9–10.3)
Chloride: 103 mmol/L (ref 98–111)
Creatinine, Ser: 1.12 mg/dL — ABNORMAL HIGH (ref 0.44–1.00)
GFR, Estimated: 54 mL/min — ABNORMAL LOW (ref >60)
Glucose, Bld: 141 mg/dL — ABNORMAL HIGH (ref 70–99)
Potassium: 4.2 mmol/L (ref 3.5–5.1)
Sodium: 137 mmol/L (ref 135–145)
Total Bilirubin: 1.5 mg/dL — ABNORMAL HIGH (ref 0.3–1.2)
Total Protein: 6.9 g/dL (ref 6.5–8.1)

## 2020-08-21 LAB — IRON AND TIBC
Iron: 115 ug/dL (ref 28–170)
Saturation Ratios: 42 % — ABNORMAL HIGH (ref 10.4–31.8)
TIBC: 277 ug/dL (ref 250–450)
UIBC: 162 ug/dL

## 2020-08-21 LAB — CBC WITH DIFFERENTIAL/PLATELET
Abs Immature Granulocytes: 0.02 10*3/uL (ref 0.00–0.07)
Basophils Absolute: 0 10*3/uL (ref 0.0–0.1)
Basophils Relative: 0 %
Eosinophils Absolute: 0.2 10*3/uL (ref 0.0–0.5)
Eosinophils Relative: 5 %
HCT: 35.4 % — ABNORMAL LOW (ref 36.0–46.0)
Hemoglobin: 11.5 g/dL — ABNORMAL LOW (ref 12.0–15.0)
Immature Granulocytes: 0 %
Lymphocytes Relative: 19 %
Lymphs Abs: 1 10*3/uL (ref 0.7–4.0)
MCH: 32.7 pg (ref 26.0–34.0)
MCHC: 32.5 g/dL (ref 30.0–36.0)
MCV: 100.6 fL — ABNORMAL HIGH (ref 80.0–100.0)
Monocytes Absolute: 0.3 10*3/uL (ref 0.1–1.0)
Monocytes Relative: 7 %
Neutro Abs: 3.6 10*3/uL (ref 1.7–7.7)
Neutrophils Relative %: 69 %
Platelets: 47 10*3/uL — ABNORMAL LOW (ref 150–400)
RBC: 3.52 MIL/uL — ABNORMAL LOW (ref 3.87–5.11)
RDW: 15.5 % (ref 11.5–15.5)
WBC: 5.1 10*3/uL (ref 4.0–10.5)
nRBC: 0 % (ref 0.0–0.2)

## 2020-08-21 LAB — VITAMIN D 25 HYDROXY (VIT D DEFICIENCY, FRACTURES): Vit D, 25-Hydroxy: 61.23 ng/mL (ref 30–100)

## 2020-08-27 DIAGNOSIS — I10 Essential (primary) hypertension: Secondary | ICD-10-CM | POA: Diagnosis not present

## 2020-08-28 ENCOUNTER — Other Ambulatory Visit: Payer: Self-pay

## 2020-08-28 ENCOUNTER — Inpatient Hospital Stay (HOSPITAL_COMMUNITY): Payer: Medicare Other | Attending: Physician Assistant | Admitting: Physician Assistant

## 2020-08-28 VITALS — BP 116/38 | HR 55 | Temp 97.0°F | Resp 18 | Wt 188.5 lb

## 2020-08-28 DIAGNOSIS — R161 Splenomegaly, not elsewhere classified: Secondary | ICD-10-CM | POA: Insufficient documentation

## 2020-08-28 DIAGNOSIS — K7460 Unspecified cirrhosis of liver: Secondary | ICD-10-CM | POA: Diagnosis not present

## 2020-08-28 DIAGNOSIS — D500 Iron deficiency anemia secondary to blood loss (chronic): Secondary | ICD-10-CM

## 2020-08-28 DIAGNOSIS — D696 Thrombocytopenia, unspecified: Secondary | ICD-10-CM | POA: Diagnosis not present

## 2020-08-28 DIAGNOSIS — D509 Iron deficiency anemia, unspecified: Secondary | ICD-10-CM | POA: Diagnosis not present

## 2020-08-28 DIAGNOSIS — K7581 Nonalcoholic steatohepatitis (NASH): Secondary | ICD-10-CM | POA: Diagnosis not present

## 2020-08-28 DIAGNOSIS — M8580 Other specified disorders of bone density and structure, unspecified site: Secondary | ICD-10-CM | POA: Diagnosis not present

## 2020-08-28 DIAGNOSIS — I959 Hypotension, unspecified: Secondary | ICD-10-CM | POA: Insufficient documentation

## 2020-08-28 NOTE — Patient Instructions (Addendum)
[place] at [facility] Discharge Instructions  You were seen today by Tarri Abernethy PA-C for your anemia and thrombocytopenia (low platelets).  Your blood count was mildly low (hemoglobin 11.5), but with normal iron levels. We will recheck this in 3 months.  Your platelts were low at 47, but this is your usual baseline due to your cirrhosis.    LABS: Return in 3 months   MEDICATIONS: Your blood pressure was low (116/38) at your appointment today.  This may be due to mild dehydration or due to your recently started medication (Nadolol 40 mg).  Drink plenty of water to avoid dehydration.  Take a half-dose of your Nadolol (only 20 mg) and call your primary care doctor to see if your dose needs to be adjusted or if it is safe for you to restart the 40 mg dose. Go to the emergency department if you experience worsening lightheadedness, dizziness, vision changes, chest pain, or syncope (passing out).  These can be signs of dangerously low blood pressure.  FOLLOW-UP APPOINTMENT: Follow up in 3 months   Thank you for choosing [place] at [facility] to provide your oncology and hematology care.  To afford each patient quality time with our provider, please arrive at least 15 minutes before your scheduled appointment time.   If you have a lab appointment with the [facility] please come in thru the Main Entrance and check in at the main information desk.  You need to re-schedule your appointment should you arrive 10 or more minutes late.  We strive to give you quality time with our providers, and arriving late affects you and other patients whose appointments are after yours.  Also, if you no show three or more times for appointments you may be dismissed from the clinic at the providers discretion.     Again, thank you for choosing [facility].  Our hope is that these requests will decrease the amount of time that you wait before being  seen by our physicians.       _____________________________________________________________  Should you have questions after your visit to [facility], please contact our office at [phone] and follow the prompts.  Our office hours are 8:00 a.m. and 4:30 p.m. Monday - Friday.  Please note that voicemails left after 4:00 p.m. may not be returned until the following business day.  We are closed weekends and major holidays.  You do have access to a nurse 24-7, just call the main number to the clinic [phone] and do not press any options, hold on the line and a nurse will answer the phone.    For prescription refill requests, have your pharmacy contact our office and allow 72 hours.    Due to Covid, you will need to wear a mask upon entering the hospital. If you do not have a mask, a mask will be given to you at the Main Entrance upon arrival. For doctor visits, patients may have 1 support person age 26 or older with them. For treatment visits, patients can not have anyone with them due to social distancing guidelines and our immunocompromised population.

## 2020-08-28 NOTE — Progress Notes (Signed)
[place], [place] [address]   CLINIC:  Medical Oncology/Hematology  PCP:  Celene Squibb, MD [address] [place] [address] [phone]    REASON FOR VISIT:  Follow-up for thrombocytopenia and iron deficiency anemia  CURRENT THERAPY: Intermittent IV iron infusions  INTERVAL HISTORY:  [name] 70 y.o. female returns for routine follow-up of her thrombocytopenia and iron deficiency anemia.  Her last IV iron was Feraheme infusion on 02/15/2020.  She was last seen at hematology clinic by Dr. Delton Coombes on 02/08/2020.  Today she reports feeling fairly well.  She reports that her energy is currently 70%, and rates her appetite at 80%.  She is eating well to maintain a stable weight at this time.  She denies any signs or symptoms of overt blood loss - no epistaxis, hematemesis, hemoptysis, hematuria, hematochezia, or melena.  She denies chest pain, dyspnea on exertion, presyncopal episodes, and palpitations.  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  I reviewed her most recent labs with her, including Hgb 11.5 with MCV 100.6.  Platelets are 47, more or less at baseline.  Her CMP is unremarkable/at baseline.  Vitamin D was normal.  Ferritin 52 with iron saturation 42%.   REVIEW OF SYSTEMS:  Review of Systems  Constitutional: Positive for appetite change (80%) and fatigue (70%). Negative for chills, diaphoresis, fever and unexpected weight change.  HENT:   Positive for trouble swallowing (Difficulty chewing due to missing teeth). Negative for lump/mass and nosebleeds.   Eyes: Negative for eye problems.  Respiratory: Negative for cough, hemoptysis and shortness of breath.   Cardiovascular: Negative for chest pain, leg swelling and palpitations.  Gastrointestinal: Positive for constipation and diarrhea. Negative for abdominal pain, blood in stool, nausea  and vomiting.  Genitourinary: Negative for hematuria.   Skin: Negative.   Neurological: Positive for dizziness, light-headedness and numbness (Chronic diabetic peripheral neuropathy). Negative for headaches.  Hematological: Bruises/bleeds easily (easy bruising).      PAST MEDICAL/SURGICAL HISTORY:  Past Medical History:  Diagnosis Date  . Alkaline phosphatase elevation   . Anemia   . ASD (atrial septal defect)   . Cataract    both eyes hx of  . Chest pain    03-17-2013 last chest pain  . Chronic kidney disease    Stage III kidney disease  . Cirrhosis ([place])   . Depression   . Diabetes mellitus type II   . Fatigue   . GERD (gastroesophageal reflux disease)   . Gout   . Headache(784.0)    occasional  . Hyperlipidemia   . Hypothyroidism   . Neuropathy   . Peripheral neuropathy   . Presence of pessary   . Retinopathy    Past Surgical History:  Procedure Laterality Date  . APPENDECTOMY    . CATARACT EXTRACTION    . CERVICAL SPINE SURGERY  2006  . CHOLECYSTECTOMY N/A 04/07/2013   Procedure: LAPAROSCOPIC CHOLECYSTECTOMY WITH INTRAOPERATIVE CHOLANGIOGRAM;  Surgeon: Merrie Roof, MD;  Location: [facility];  Service: General;  Laterality: N/A;  . COMBINED HYSTERECTOMY VAGINAL / OOPHORECTOMY / A&P REPAIR  1987   Unilateral oophorectomy, h/o uterine prolapse has right ovary  . ERCP N/A 04/06/2013   Procedure: ENDOSCOPIC RETROGRADE CHOLANGIOPANCREATOGRAPHY (ERCP);  Surgeon: Beryle Beams, MD;  Location: [facility];  Service: Endoscopy;  Laterality: N/A;  . ESOPHAGOGASTRODUODENOSCOPY (EGD) WITH PROPOFOL N/A 08/16/2020  Procedure: ESOPHAGOGASTRODUODENOSCOPY (EGD) WITH PROPOFOL;  Surgeon: Carol Ada, MD;  Location: [facility];  Service: Endoscopy;  Laterality: N/A;  . EUS N/A 03/31/2013   Procedure: UPPER ENDOSCOPIC ULTRASOUND (EUS) LINEAR;  Surgeon: Beryle Beams, MD;  Location: [facility];  Service: Endoscopy;  Laterality: N/A;  . REFRACTIVE SURGERY    . SPINAL FUSION    .  TONSILLECTOMY  age 89     SOCIAL HISTORY:  Social History   Socioeconomic History  . Marital status: Divorced    Spouse name: Not on file  . Number of children: 3  . Years of education: 48  . Highest education level: Not on file  Occupational History  . 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 Smoking status: Never Smoker  . Smokeless tobacco: Never Used  Vaping Use  . Vaping Use: Never used  Substance and Sexual Activity  . Alcohol use: No  . Drug use: No  . Sexual activity: Not Currently    Birth control/protection: Surgical    Comment: hyst  Other Topics Concern  . Not on file  Social History Narrative   Divorced   Lives alone. Reports that her son recently moved out after living with her for a long time. She reports that she is happy to be living alone and feels like she was enabling his behavior. Reports that he had a history of drug use.   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    No pets.   Eats all food groups.    Wears seat belt.    Lives in house.    Smoke detectors.    Social Determinants of Health   Financial Resource Strain: Medium Risk  . Difficulty of Paying Living Expenses: Somewhat hard  Food Insecurity: No Food Insecurity  . Worried About Charity fundraiser in the Last Year: Never true  . Ran Out of Food in the Last Year: Never true  Transportation Needs: No Transportation Needs  . Lack of Transportation (Medical): No  . Lack of Transportation (Non-Medical): No  Physical Activity: Inactive  . Days of Exercise per Week: 0 days  . Minutes of Exercise per Session: 0 min  Stress: No Stress Concern Present  . Feeling of Stress : Not at all  Social Connections: Moderately Integrated  . Frequency of Communication with Friends and Family: More than three times a week  . Frequency of Social Gatherings with Friends and Family: Three times a week  . Attends  Religious Services: More than 4 times per year  . Active Member of Clubs or Organizations: Yes  . Attends Archivist Meetings: 1 to 4 times per year  . Marital Status: Divorced  Human resources officer Violence: Not At Risk  . Fear of Current or Ex-Partner: No  . Emotionally Abused: No  . Physically Abused: No  . Sexually Abused: No    FAMILY HISTORY:  Family History  Problem Relation Age of Onset  . COPD Mother   . Lung cancer Father   . Diabetes Sister   . Cataracts Sister   . Insulin resistance Daughter   . Insulin resistance Son     CURRENT MEDICATIONS:  Outpatient Encounter Medications as of 08/28/2020  Medication Sig Note  . allopurinol (ZYLOPRIM) 100 MG tablet Take 100 mg by mouth every evening.    . Carboxymethylcellulose Sod PF 0.5 %  SOLN Apply 1 drop to eye daily as needed (dry eyes).   . Cholecalciferol (VITAMIN D) 125 MCG (5000 UT) CAPS Take 5,000 Units by mouth See admin instructions. Take 1 tablet every Wednesday.   . furosemide (LASIX) 40 MG tablet Take 40 mg by mouth daily. May take 40 mg twice daily for 3 days for swelling   . gabapentin (NEURONTIN) 100 MG capsule Take 100 mg by mouth 2 (two) times daily.   . hydrOXYzine (ATARAX/VISTARIL) 25 MG tablet Take 25 mg by mouth 3 (three) times daily as needed for anxiety or itching (Helps with neuropathy).    . insulin regular (NOVOLIN R,HUMULIN R) 100 units/mL injection Inject 5 Units into the skin 2 (two) times daily before a meal.   . levothyroxine (SYNTHROID) 137 MCG tablet Take 137 mcg by mouth daily.   . Multiple Vitamins-Minerals (PRESERVISION AREDS 2) CAPS Take 1 capsule by mouth daily.   [name] NOVOLIN N RELION 100 UNIT/ML injection 12-14 Units 2 (two) times daily before a meal.   . ONETOUCH ULTRA test strip    . Vitamin D, Ergocalciferol, (DRISDOL) 1.25 MG (50000 UT) CAPS capsule Take 50,000 Units by mouth once a week. 08/08/2020: Saturday   No facility-administered encounter medications on file as of 08/28/2020.     ALLERGIES:  Allergies  Allergen Reactions  . Statins Other (See Comments)    REACTION: diarrhea/joint pain/muscle aches. Tried at low doses.   . Dilaudid [Hydromorphone Hcl] Nausea And Vomiting    After one vomiting episode no further vomiting     PHYSICAL EXAM:  ECOG PERFORMANCE STATUS: 1 - Symptomatic but completely ambulatory  Vitals:   08/28/20 1337  BP: (!) 130/39  Pulse: (!) 55  Resp: 18  Temp: (!) 97 F (36.1 C)  SpO2: 99%   Filed Weights   08/28/20 1337  Weight: 188 lb 7.9 oz (85.5 kg)   Physical Exam Constitutional:      Appearance: Normal appearance. She is obese.  HENT:     Head: Normocephalic and atraumatic.     Mouth/Throat:     Mouth: Mucous membranes are moist.  Eyes:     Extraocular Movements: Extraocular movements intact.     Pupils: Pupils are equal, round, and reactive to light.  Cardiovascular:     Rate and Rhythm: Normal rate and regular rhythm.     Pulses: Normal pulses.     Heart sounds: Normal heart sounds.  Pulmonary:     Effort: Pulmonary effort is normal.     Breath sounds: Normal breath sounds.  Abdominal:     General: Bowel sounds are normal.     Palpations: Abdomen is soft.     Tenderness: There is no abdominal tenderness.  Musculoskeletal:        General: No swelling.     Right lower leg: No edema.     Left lower leg: No edema.  Lymphadenopathy:     Cervical: No cervical adenopathy.  Skin:    General: Skin is warm and dry.  Neurological:     General: No focal deficit present.     Mental Status: She is alert and oriented to person, place, and time.  Psychiatric:        Mood and Affect: Mood normal.        Behavior: Behavior normal.      LABORATORY DATA:  I have reviewed the labs as listed.  CBC    Component Value Date/Time   WBC 5.1 08/21/2020 1417   RBC 3.52 (  L) 08/21/2020 1417   HGB 11.5 (L) 08/21/2020 1417   HCT 35.4 (L) 08/21/2020 1417   PLT 47 (L) 08/21/2020 1417   MCV 100.6 (H) 08/21/2020 1417   MCH  32.7 08/21/2020 1417   MCHC 32.5 08/21/2020 1417   RDW 15.5 08/21/2020 1417   LYMPHSABS 1.0 08/21/2020 1417   MONOABS 0.3 08/21/2020 1417   EOSABS 0.2 08/21/2020 1417   BASOSABS 0.0 08/21/2020 1417   CMP Latest Ref Rng & Units 08/21/2020 07/16/2020 02/08/2020  Glucose 70 - 99 mg/dL 141(H) 145(H) 146(H)  BUN 8 - 23 mg/dL 28(H) 36(H) 30(H)  Creatinine 0.44 - 1.00 mg/dL 1.12(H) 1.35(H) 1.22(H)  Sodium 135 - 145 mmol/L 137 139 137  Potassium 3.5 - 5.1 mmol/L 4.2 3.5 3.7  Chloride 98 - 111 mmol/L 103 105 103  CO2 22 - 32 mmol/L 27 25 26   Calcium 8.9 - 10.3 mg/dL 8.7(L) 8.4(L) 8.5(L)  Total Protein 6.5 - 8.1 g/dL 6.9 - 6.5  Total Bilirubin 0.3 - 1.2 mg/dL 1.5(H) - 1.1  Alkaline Phos 38 - 126 U/L 111 - 96  AST 15 - 41 U/L 36 - 30  ALT 0 - 44 U/L 23 - 20    DIAGNOSTIC IMAGING:  I have independently reviewed the relevant imaging and discussed with the patient.  ASSESSMENT: 1. Moderate thrombocytopenia: -Her CBC on 07/06/2017 shows a platelet count of 66. She has mild thrombocytopenia, mostly above 100 since 2015. 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 -Differential diagnosis favors thrombocytopenia secondary to splenomegaly, less likely to be immune mediated thrombocytopenia or possible MDS. -Nutritional deficiency work-up was negative. ANA was 1:80 speckled pattern positive, although weakly. She does not have any clinical signs or symptoms of lupus.We considered it is false-positive. - Review of most recent labs (08/21/2020) with platelets 47 - Denies any current signs or symptoms of bleeding does have some mild easy bruising  2.  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  Osteopenia: -Her last DEXA scan was on 07/08/2017 with T score of -1.4 in the left femoral neck. I have recommended calcium and vitamin D supplements and weightbearing exercises.    PLAN:  1. Moderate thrombocytopenia: -She has easy bruising but denies any bleeding. -CBC on 08/21/2020 with platelets 47 -This is from splenomegaly from cirrhosis.  There might be underlying component of immune thrombocytopenia or MDS. -No acute intervention necessary at this time.  RTC 3 months with labs.  2.  Iron deficiency anemia: - Most recent CBC (08/21/2020): Hgb 11.5/MCV 100.6, ferritin 52, iron saturation 42% - Most recent IV Feraheme 02/05/2020 - No indication for IV iron at this time - Will repeat CBC and iron panel in 3 months  3. Osteopenia: -Consider repeating DEXA scan this year (2022) via PCP  4.  Borderline hypotension - Blood pressure 116/38 during visit today - Mildly symptomatic with lightheadedness, although this is reportedly chronic and at baseline - Recently started on nadolol 40 mg daily - Instructed patient to cut her nadolol in half and take only 20 mg daily and to call her PCP as soon as possible for further dose instructions - Instructed to present to the emergency department with worsening dizziness/lightheadedness, syncopal episodes, chest pain, vision changes, or other signs of  life-threatening hypotension   PLAN SUMMARY & DISPOSITION: -Labs and RTC in 3 months  All questions were answered. The patient knows to call the clinic with any problems, questions or concerns.  Medical decision making: Low  Time spent on visit: I spent 15 minutes counseling the patient face to face. The total time spent in the appointment was 25 minutes and more than 50% was on counseling.   Harriett Rush, PA-C  08/28/20 1:51 PM

## 2020-08-29 ENCOUNTER — Encounter (HOSPITAL_COMMUNITY): Payer: Self-pay | Admitting: Hematology

## 2020-09-02 ENCOUNTER — Other Ambulatory Visit: Payer: Self-pay

## 2020-09-02 ENCOUNTER — Ambulatory Visit (HOSPITAL_COMMUNITY)
Admission: RE | Admit: 2020-09-02 | Discharge: 2020-09-02 | Disposition: A | Discharge status: Home / Self Care | Payer: Medicare Other | Source: Ambulatory Visit | Attending: Internal Medicine | Admitting: Internal Medicine

## 2020-09-02 DIAGNOSIS — Z1231 Encounter for screening mammogram for malignant neoplasm of breast: Secondary | ICD-10-CM | POA: Insufficient documentation

## 2020-09-10 DIAGNOSIS — K5900 Constipation, unspecified: Secondary | ICD-10-CM | POA: Diagnosis not present

## 2020-09-10 DIAGNOSIS — Z86010 Personal history of colonic polyps: Secondary | ICD-10-CM | POA: Diagnosis not present

## 2020-09-10 DIAGNOSIS — K7460 Unspecified cirrhosis of liver: Secondary | ICD-10-CM | POA: Diagnosis not present

## 2020-09-10 DIAGNOSIS — I8500 Esophageal varices without bleeding: Secondary | ICD-10-CM | POA: Diagnosis not present

## 2020-09-17 DIAGNOSIS — N1831 Chronic kidney disease, stage 3a: Secondary | ICD-10-CM | POA: Diagnosis not present

## 2020-09-23 ENCOUNTER — Encounter (HOSPITAL_COMMUNITY): Payer: Self-pay | Admitting: Hematology

## 2020-09-23 ENCOUNTER — Encounter (INDEPENDENT_AMBULATORY_CARE_PROVIDER_SITE_OTHER): Payer: Medicare Other | Admitting: Ophthalmology

## 2020-09-23 ENCOUNTER — Other Ambulatory Visit: Payer: Self-pay

## 2020-09-23 DIAGNOSIS — E113512 Type 2 diabetes mellitus with proliferative diabetic retinopathy with macular edema, left eye: Secondary | ICD-10-CM

## 2020-09-23 DIAGNOSIS — E113391 Type 2 diabetes mellitus with moderate nonproliferative diabetic retinopathy without macular edema, right eye: Secondary | ICD-10-CM | POA: Diagnosis not present

## 2020-09-23 DIAGNOSIS — H43813 Vitreous degeneration, bilateral: Secondary | ICD-10-CM | POA: Diagnosis not present

## 2020-09-25 DIAGNOSIS — N1831 Chronic kidney disease, stage 3a: Secondary | ICD-10-CM | POA: Diagnosis not present

## 2020-09-25 DIAGNOSIS — D631 Anemia in chronic kidney disease: Secondary | ICD-10-CM | POA: Diagnosis not present

## 2020-09-25 DIAGNOSIS — I129 Hypertensive chronic kidney disease with stage 1 through stage 4 chronic kidney disease, or unspecified chronic kidney disease: Secondary | ICD-10-CM | POA: Diagnosis not present

## 2020-09-25 DIAGNOSIS — M9080 Osteopathy in diseases classified elsewhere, unspecified site: Secondary | ICD-10-CM | POA: Diagnosis not present

## 2020-09-25 DIAGNOSIS — E889 Metabolic disorder, unspecified: Secondary | ICD-10-CM | POA: Diagnosis not present

## 2020-10-17 ENCOUNTER — Other Ambulatory Visit: Payer: Self-pay

## 2020-10-17 ENCOUNTER — Ambulatory Visit (INDEPENDENT_AMBULATORY_CARE_PROVIDER_SITE_OTHER): Payer: Medicare Other | Admitting: Obstetrics & Gynecology

## 2020-10-17 ENCOUNTER — Encounter: Payer: Self-pay | Admitting: Obstetrics & Gynecology

## 2020-10-17 VITALS — BP 116/50 | HR 52 | Ht 66.0 in | Wt 188.0 lb

## 2020-10-17 DIAGNOSIS — Z4689 Encounter for fitting and adjustment of other specified devices: Secondary | ICD-10-CM | POA: Diagnosis not present

## 2020-10-17 DIAGNOSIS — R339 Retention of urine, unspecified: Secondary | ICD-10-CM

## 2020-10-17 DIAGNOSIS — N993 Prolapse of vaginal vault after hysterectomy: Secondary | ICD-10-CM | POA: Diagnosis not present

## 2020-10-17 NOTE — Progress Notes (Signed)
Chief Complaint  Patient presents with   Pessary Check    Blood pressure (!) 116/50, pulse (!) 52, height 5' 6"  (1.676 m), weight 188 lb (85.3 kg).  [name] presents today for routine follow up related to her pessary.   She uses a Gelhorn 2 3/4 inch She reports no vaginal discharge and no vaginal bleeding   Likert scale(1 not bothersome -5 very bothersome)  :  1  Exam reveals no undue vaginal mucosal pressure of breakdown, no discharge and no vaginal bleeding.  Vaginal Epithelial Abnormality Classification System:   0 0    No abnormalities 1    Epithelial erythema 2    Granulation tissue 3    Epithelial break or erosion, 1 cm or less 4    Epithelial break or erosion, 1 cm or greater  The pessary is removed, cleaned and replaced without difficulty.    No diagnosis found.   [name] will be sen back in 3 months for continued follow up., per pt request  Florian Buff, MD  10/17/2020 2:05 PM

## 2020-11-18 ENCOUNTER — Other Ambulatory Visit: Payer: Self-pay

## 2020-11-18 ENCOUNTER — Encounter (INDEPENDENT_AMBULATORY_CARE_PROVIDER_SITE_OTHER): Payer: Medicare Other | Admitting: Ophthalmology

## 2020-11-18 DIAGNOSIS — E113391 Type 2 diabetes mellitus with moderate nonproliferative diabetic retinopathy without macular edema, right eye: Secondary | ICD-10-CM | POA: Diagnosis not present

## 2020-11-18 DIAGNOSIS — E113512 Type 2 diabetes mellitus with proliferative diabetic retinopathy with macular edema, left eye: Secondary | ICD-10-CM | POA: Diagnosis not present

## 2020-11-18 DIAGNOSIS — H43813 Vitreous degeneration, bilateral: Secondary | ICD-10-CM | POA: Diagnosis not present

## 2020-11-25 DIAGNOSIS — E1165 Type 2 diabetes mellitus with hyperglycemia: Secondary | ICD-10-CM | POA: Diagnosis not present

## 2020-11-25 DIAGNOSIS — E039 Hypothyroidism, unspecified: Secondary | ICD-10-CM | POA: Diagnosis not present

## 2020-11-25 DIAGNOSIS — E7800 Pure hypercholesterolemia, unspecified: Secondary | ICD-10-CM | POA: Diagnosis not present

## 2020-11-25 DIAGNOSIS — E11319 Type 2 diabetes mellitus with unspecified diabetic retinopathy without macular edema: Secondary | ICD-10-CM | POA: Diagnosis not present

## 2020-11-25 DIAGNOSIS — Z0000 Encounter for general adult medical examination without abnormal findings: Secondary | ICD-10-CM | POA: Diagnosis not present

## 2020-11-25 DIAGNOSIS — R7989 Other specified abnormal findings of blood chemistry: Secondary | ICD-10-CM | POA: Diagnosis not present

## 2020-11-25 DIAGNOSIS — N183 Chronic kidney disease, stage 3 unspecified: Secondary | ICD-10-CM | POA: Diagnosis not present

## 2020-11-25 DIAGNOSIS — I129 Hypertensive chronic kidney disease with stage 1 through stage 4 chronic kidney disease, or unspecified chronic kidney disease: Secondary | ICD-10-CM | POA: Diagnosis not present

## 2020-11-25 DIAGNOSIS — E1140 Type 2 diabetes mellitus with diabetic neuropathy, unspecified: Secondary | ICD-10-CM | POA: Diagnosis not present

## 2020-11-26 DIAGNOSIS — Z862 Personal history of diseases of the blood and blood-forming organs and certain disorders involving the immune mechanism: Secondary | ICD-10-CM | POA: Insufficient documentation

## 2020-11-26 DIAGNOSIS — E559 Vitamin D deficiency, unspecified: Secondary | ICD-10-CM | POA: Insufficient documentation

## 2020-11-26 DIAGNOSIS — M109 Gout, unspecified: Secondary | ICD-10-CM | POA: Insufficient documentation

## 2020-11-26 DIAGNOSIS — E782 Mixed hyperlipidemia: Secondary | ICD-10-CM | POA: Insufficient documentation

## 2020-11-27 ENCOUNTER — Encounter (HOSPITAL_COMMUNITY): Payer: Self-pay | Admitting: Hematology

## 2020-11-28 ENCOUNTER — Other Ambulatory Visit: Payer: Self-pay

## 2020-11-28 ENCOUNTER — Inpatient Hospital Stay (HOSPITAL_COMMUNITY): Payer: Medicare Other | Attending: Hematology

## 2020-11-28 DIAGNOSIS — M8580 Other specified disorders of bone density and structure, unspecified site: Secondary | ICD-10-CM | POA: Insufficient documentation

## 2020-11-28 DIAGNOSIS — R161 Splenomegaly, not elsewhere classified: Secondary | ICD-10-CM | POA: Diagnosis not present

## 2020-11-28 DIAGNOSIS — K7581 Nonalcoholic steatohepatitis (NASH): Secondary | ICD-10-CM | POA: Diagnosis not present

## 2020-11-28 DIAGNOSIS — I10 Essential (primary) hypertension: Secondary | ICD-10-CM | POA: Diagnosis not present

## 2020-11-28 DIAGNOSIS — D509 Iron deficiency anemia, unspecified: Secondary | ICD-10-CM | POA: Diagnosis not present

## 2020-11-28 DIAGNOSIS — D500 Iron deficiency anemia secondary to blood loss (chronic): Secondary | ICD-10-CM

## 2020-11-28 DIAGNOSIS — K7460 Unspecified cirrhosis of liver: Secondary | ICD-10-CM | POA: Diagnosis not present

## 2020-11-28 DIAGNOSIS — Z79899 Other long term (current) drug therapy: Secondary | ICD-10-CM | POA: Diagnosis not present

## 2020-11-28 DIAGNOSIS — D696 Thrombocytopenia, unspecified: Secondary | ICD-10-CM | POA: Diagnosis not present

## 2020-11-28 LAB — CBC WITH DIFFERENTIAL/PLATELET
Abs Immature Granulocytes: 0.01 10*3/uL (ref 0.00–0.07)
Basophils Absolute: 0 10*3/uL (ref 0.0–0.1)
Basophils Relative: 0 %
Eosinophils Absolute: 0.3 10*3/uL (ref 0.0–0.5)
Eosinophils Relative: 5 %
HCT: 34.6 % — ABNORMAL LOW (ref 36.0–46.0)
Hemoglobin: 11.4 g/dL — ABNORMAL LOW (ref 12.0–15.0)
Immature Granulocytes: 0 %
Lymphocytes Relative: 19 %
Lymphs Abs: 0.9 10*3/uL (ref 0.7–4.0)
MCH: 32.1 pg (ref 26.0–34.0)
MCHC: 32.9 g/dL (ref 30.0–36.0)
MCV: 97.5 fL (ref 80.0–100.0)
Monocytes Absolute: 0.3 10*3/uL (ref 0.1–1.0)
Monocytes Relative: 6 %
Neutro Abs: 3.3 10*3/uL (ref 1.7–7.7)
Neutrophils Relative %: 70 %
Platelets: 48 10*3/uL — ABNORMAL LOW (ref 150–400)
RBC: 3.55 MIL/uL — ABNORMAL LOW (ref 3.87–5.11)
RDW: 15.6 % — ABNORMAL HIGH (ref 11.5–15.5)
WBC: 4.8 10*3/uL (ref 4.0–10.5)
nRBC: 0 % (ref 0.0–0.2)

## 2020-11-28 LAB — IRON AND TIBC
Iron: 72 ug/dL (ref 28–170)
Saturation Ratios: 24 % (ref 10.4–31.8)
TIBC: 294 ug/dL (ref 250–450)
UIBC: 222 ug/dL

## 2020-11-28 LAB — VITAMIN B12: Vitamin B-12: 763 pg/mL (ref 180–914)

## 2020-11-28 LAB — FOLATE: Folate: 15.1 ng/mL (ref >5.9)

## 2020-11-28 LAB — FERRITIN: Ferritin: 39 ng/mL (ref 11–307)

## 2020-12-01 LAB — METHYLMALONIC ACID, SERUM: Methylmalonic Acid, Quantitative: 437 nmol/L — ABNORMAL HIGH (ref 0–378)

## 2020-12-04 DIAGNOSIS — M109 Gout, unspecified: Secondary | ICD-10-CM | POA: Diagnosis not present

## 2020-12-04 DIAGNOSIS — E11319 Type 2 diabetes mellitus with unspecified diabetic retinopathy without macular edema: Secondary | ICD-10-CM | POA: Diagnosis not present

## 2020-12-04 DIAGNOSIS — N189 Chronic kidney disease, unspecified: Secondary | ICD-10-CM | POA: Diagnosis not present

## 2020-12-04 DIAGNOSIS — E039 Hypothyroidism, unspecified: Secondary | ICD-10-CM | POA: Diagnosis not present

## 2020-12-04 DIAGNOSIS — K7460 Unspecified cirrhosis of liver: Secondary | ICD-10-CM | POA: Insufficient documentation

## 2020-12-04 DIAGNOSIS — E782 Mixed hyperlipidemia: Secondary | ICD-10-CM | POA: Diagnosis not present

## 2020-12-04 DIAGNOSIS — J309 Allergic rhinitis, unspecified: Secondary | ICD-10-CM | POA: Diagnosis not present

## 2020-12-04 DIAGNOSIS — K219 Gastro-esophageal reflux disease without esophagitis: Secondary | ICD-10-CM | POA: Diagnosis not present

## 2020-12-04 DIAGNOSIS — E559 Vitamin D deficiency, unspecified: Secondary | ICD-10-CM | POA: Diagnosis not present

## 2020-12-04 DIAGNOSIS — Z0001 Encounter for general adult medical examination with abnormal findings: Secondary | ICD-10-CM | POA: Diagnosis not present

## 2020-12-04 DIAGNOSIS — D696 Thrombocytopenia, unspecified: Secondary | ICD-10-CM | POA: Diagnosis not present

## 2020-12-04 NOTE — Progress Notes (Signed)
[place], [place] [address]   CLINIC:  Medical Oncology/Hematology  PCP:  Celene Squibb, MD [address] [place] [id number] [phone]   REASON FOR VISIT:  Follow-up for thrombocytopenia and iron deficiency anemia in the setting of underlying cirrhosis  CURRENT THERAPY: Intermittent IV iron infusions (last Feraheme on 02/08/2020)  INTERVAL HISTORY:  [name] 70 y.o. female returns for routine follow-up of her thrombocytopenia and iron deficiency anemia in the setting of underlying liver cirrhosis.  She was last seen by [name] on 08/28/2020.  At today's visit, she reports feeling fair.  No recent hospitalizations, surgeries, or changes in baseline health status.  She continues to report easy bruising, but without petechial rash.  No major bleeding events such as hematemesis, hematochezia, or melena.  She continues to have moderate fatigue that comes and goes in waves, but denies any deviation from her baseline fatigue.  No chest pain, dyspnea, presyncopal episodes, or palpitations.  She has 70% energy and 100% appetite. She endorses that she is maintaining a stable weight.   REVIEW OF SYSTEMS:  Review of Systems  Constitutional:  Positive for fatigue. Negative for appetite change, chills, diaphoresis, fever and unexpected weight change.  HENT:   Positive for trouble swallowing (Unable to chew due to problems with teeth). Negative for lump/mass and nosebleeds.   Eyes:  Negative for eye problems.  Respiratory:  Positive for cough (Occasional). Negative for hemoptysis and shortness of breath.   Cardiovascular:  Negative for chest pain, leg swelling and palpitations.  Gastrointestinal:  Positive for constipation and diarrhea. Negative for abdominal pain, blood in stool, nausea and vomiting.  Genitourinary:  Negative for hematuria.   Skin: Negative.   Neurological:  Positive for dizziness (Positional vertigo) and numbness  (Feet). Negative for headaches and light-headedness.  Hematological:  Does not bruise/bleed easily.     PAST MEDICAL/SURGICAL HISTORY:  Past Medical History:  Diagnosis Date   Alkaline phosphatase elevation    Anemia    ASD (atrial septal defect)    Cataract    both eyes hx of   Chest pain    03-17-2013 last chest pain   Chronic kidney disease    Stage III kidney disease   Cirrhosis ([place])    Depression    Diabetes mellitus type II    Fatigue    GERD (gastroesophageal reflux disease)    Gout    Headache(784.0)    occasional   Hyperlipidemia    Hypothyroidism    Neuropathy    Peripheral neuropathy    Presence of pessary    Retinopathy    Past Surgical History:  Procedure Laterality Date   APPENDECTOMY     CATARACT EXTRACTION     CERVICAL SPINE SURGERY  2006   CHOLECYSTECTOMY N/A 04/07/2013   Procedure: LAPAROSCOPIC CHOLECYSTECTOMY WITH INTRAOPERATIVE CHOLANGIOGRAM;  Surgeon: Merrie Roof, MD;  Location: [facility];  Service: General;  Laterality: N/A;   COMBINED HYSTERECTOMY VAGINAL / OOPHORECTOMY / A&P REPAIR  1987   Unilateral oophorectomy, h/o uterine prolapse has right ovary   ERCP N/A 04/06/2013   Procedure: ENDOSCOPIC RETROGRADE CHOLANGIOPANCREATOGRAPHY (ERCP);  Surgeon: Beryle Beams, MD;  Location: [facility];  Service: Endoscopy;  Laterality: N/A;   ESOPHAGOGASTRODUODENOSCOPY (EGD) WITH PROPOFOL N/A 08/16/2020   Procedure: ESOPHAGOGASTRODUODENOSCOPY (EGD) WITH PROPOFOL;  Surgeon: Carol Ada, MD;  Location: [facility];  Service: Endoscopy;  Laterality: N/A;   EUS N/A 03/31/2013   Procedure: UPPER ENDOSCOPIC ULTRASOUND (  EUS) LINEAR;  Surgeon: Beryle Beams, MD;  Location: [facility];  Service: Endoscopy;  Laterality: N/A;   REFRACTIVE SURGERY     SPINAL FUSION     TONSILLECTOMY  age 40     SOCIAL HISTORY:  Social History   Socioeconomic History   Marital status: Divorced    Spouse name: Not on file   Number of children: 3   Years of education: 12    Highest education level: Not on file  Occupational History   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 Reports that her son recently moved out after living with her for a long time. She reports that she is happy to be living alone and feels like she was enabling his behavior. Reports that he had a history of drug use.   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    No pets.   Eats all food groups.    Wears seat belt.    Lives in house.    Smoke detectors.    Social Determinants of Health   Financial Resource Strain: Not on file  Food Insecurity: Not on file  Transportation Needs: Not on file  Physical Activity: Not on file  Stress: Not on file  Social Connections: Not on file  Intimate Partner Violence: Not on file    FAMILY HISTORY:  Family History  Problem Relation Age of Onset   COPD Mother    Lung cancer Father    Diabetes Sister    Cataracts Sister    Insulin resistance Daughter    Insulin resistance Son     CURRENT MEDICATIONS:  Outpatient Encounter Medications as of 12/05/2020  Medication Sig Note   allopurinol (ZYLOPRIM) 100 MG tablet Take 100 mg by mouth every evening.     Carboxymethylcellulose Sod PF 0.5 % SOLN Apply 1 drop to eye daily as needed (dry eyes).    Cholecalciferol (VITAMIN D) 125 MCG (5000 UT) CAPS Take 5,000 Units by mouth See admin instructions. Take 1 tablet every Wednesday.    furosemide (LASIX) 40 MG tablet Take 40 mg by mouth daily. May take 40 mg twice daily for 3 days for swelling (Patient not taking: Reported on 10/17/2020)     gabapentin (NEURONTIN) 100 MG capsule Take 100 mg by mouth 2 (two) times daily. (Patient not taking: Reported on 10/17/2020)    hydrOXYzine (ATARAX/VISTARIL) 25 MG tablet Take 25 mg by mouth 3 (three) times daily as needed for anxiety or itching (Helps with neuropathy).     insulin regular (NOVOLIN R,HUMULIN R) 100 units/mL injection Inject 5 Units into the skin 2 (two) times daily before a meal.    levothyroxine (SYNTHROID) 137 MCG tablet Take 137 mcg by mouth daily.    Multiple Vitamins-Minerals (PRESERVISION AREDS 2) CAPS Take 1 capsule by mouth daily.    nadolol (CORGARD) 40 MG tablet Take 40 mg by mouth daily.    NOVOLIN N RELION 100 UNIT/ML injection 12-14 Units 2 (two)  times daily before a meal.    ONETOUCH ULTRA test strip     torsemide (DEMADEX) 20 MG tablet Take 20 mg by mouth 2 (two) times daily.    Vitamin D, Ergocalciferol, (DRISDOL) 1.25 MG (50000 UT) CAPS capsule Take 50,000 Units by mouth once a week. 08/08/2020: Saturday   No facility-administered encounter medications on file as of 12/05/2020.    ALLERGIES:  Allergies  Allergen Reactions   Statins Other (See Comments)    REACTION: diarrhea/joint pain/muscle aches. Tried at low doses.    Dilaudid [Hydromorphone Hcl] Nausea And Vomiting    After one vomiting episode no further vomiting     PHYSICAL EXAM:  ECOG PERFORMANCE STATUS: 1 - Symptomatic but completely ambulatory  There were no vitals filed for this visit. There were no vitals filed for this visit. Physical Exam Constitutional:      Appearance: Normal appearance. She is obese.  HENT:     Head: Normocephalic and atraumatic.     Mouth/Throat:     Mouth: Mucous membranes are moist.  Eyes:     Extraocular Movements: Extraocular movements intact.     Pupils: Pupils are equal, round, and reactive to light.  Cardiovascular:     Rate and Rhythm: Normal rate and regular rhythm.     Pulses: Normal pulses.     Heart sounds: Normal heart sounds.  Pulmonary:      Effort: Pulmonary effort is normal.     Breath sounds: Normal breath sounds.  Abdominal:     General: Bowel sounds are normal.     Palpations: Abdomen is soft.     Tenderness: There is no abdominal tenderness.  Musculoskeletal:        General: No swelling.     Right lower leg: No edema.     Left lower leg: No edema.  Lymphadenopathy:     Cervical: No cervical adenopathy.  Skin:    General: Skin is warm and dry.  Neurological:     General: No focal deficit present.     Mental Status: She is alert and oriented to person, place, and time.  Psychiatric:        Mood and Affect: Mood normal.        Behavior: Behavior normal.     LABORATORY DATA:  I have reviewed the labs as listed.  CBC    Component Value Date/Time   WBC 4.8 11/28/2020 1313   RBC 3.55 (L) 11/28/2020 1313   HGB 11.4 (L) 11/28/2020 1313   HCT 34.6 (L) 11/28/2020 1313   PLT 48 (L) 11/28/2020 1313   MCV 97.5 11/28/2020 1313   MCH 32.1 11/28/2020 1313   MCHC 32.9 11/28/2020 1313   RDW 15.6 (H) 11/28/2020 1313   LYMPHSABS 0.9 11/28/2020 1313   MONOABS 0.3 11/28/2020 1313   EOSABS 0.3 11/28/2020 1313   BASOSABS 0.0 11/28/2020 1313   CMP Latest Ref Rng & Units 08/21/2020 07/16/2020 02/08/2020  Glucose 70 - 99 mg/dL 141(H) 145(H) 146(H)  BUN 8 - 23 mg/dL 28(H) 36(H) 30(H)  Creatinine 0.44 - 1.00 mg/dL 1.12(H) 1.35(H) 1.22(H)  Sodium 135 - 145 mmol/L 137 139 137  Potassium 3.5 - 5.1 mmol/L 4.2 3.5 3.7  Chloride 98 - 111 mmol/L 103 105 103  CO2 22 - 32 mmol/L 27 25 26   Calcium 8.9 - 10.3 mg/dL 8.7(L) 8.4(L) 8.5(L)  Total Protein 6.5 - 8.1 g/dL 6.9 - 6.5  Total Bilirubin 0.3 - 1.2 mg/dL 1.5(H) - 1.1  Alkaline Phos 38 - 126  U/L 111 - 96  AST 15 - 41 U/L 36 - 30  ALT 0 - 44 U/L 23 - 20    DIAGNOSTIC IMAGING:  I have independently reviewed the relevant imaging and discussed with the patient.  ASSESSMENT & PLAN: 1.  Moderate thrombocytopenia: - Her CBC on 07/06/2017 shows a platelet count of 66.  She has mild  thrombocytopenia, mostly above 100 since 2015.  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 - Differential diagnosis favors thrombocytopenia secondary to splenomegaly, less likely to be immune mediated thrombocytopenia or possible MDS. -Nutritional deficiency work-up was negative.  ANA was 1:80 speckled pattern positive, although weakly.  She does not have any clinical signs or symptoms of lupus.  We considered it is false-positive. - Review of most recent labs (11/28/2020): Platelets 48, at baseline - Denies any current signs or symptoms of bleeding does have some mild easy bruising without petechial rash  - PLAN:   Platelets remained stable at baseline.  No indication for treatment at this time.  Labs and RTC in 4 months.  2.  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  Repeat labs and RTC in 4 months.  3.  B12 deficiency -Most recent labs (11/28/2020) show normal B12 763, but elevated methylmalonic acid 437, indicative of mild B12 deficiency - PLAN: Recommend starting B12 cyanocobalamin 1000 mcg daily.  We will repeat B12/MMA in 4 months.  4.  Osteopenia: - Her last DEXA scan was on 07/08/2017 with T score of -1.4 in the left femoral neck.  I have recommended calcium and vitamin D supplements and weightbearing exercises - PLAN: Consider repeating DEXA scan this year (2022) via PCP   PLAN SUMMARY & DISPOSITION: - Patient has declined IV iron at this time - Labs in 4 months - RTC after  labs  All questions were answered. The patient knows to call the clinic with any problems, questions or concerns.  Medical decision making: Low  Time spent on visit: I spent 15 minutes counseling the patient face to face. The total time spent in the appointment was 25 minutes and more than 50% was on counseling.   Harriett Rush, PA-C  12/05/2020 3:24 PM

## 2020-12-05 ENCOUNTER — Inpatient Hospital Stay (HOSPITAL_BASED_OUTPATIENT_CLINIC_OR_DEPARTMENT_OTHER): Payer: Medicare Other | Admitting: Physician Assistant

## 2020-12-05 VITALS — BP 130/69 | HR 58 | Temp 98.0°F | Resp 18 | Wt 189.6 lb

## 2020-12-05 DIAGNOSIS — D696 Thrombocytopenia, unspecified: Secondary | ICD-10-CM | POA: Diagnosis not present

## 2020-12-05 DIAGNOSIS — D500 Iron deficiency anemia secondary to blood loss (chronic): Secondary | ICD-10-CM

## 2020-12-05 DIAGNOSIS — M8580 Other specified disorders of bone density and structure, unspecified site: Secondary | ICD-10-CM | POA: Diagnosis not present

## 2020-12-05 DIAGNOSIS — R161 Splenomegaly, not elsewhere classified: Secondary | ICD-10-CM | POA: Diagnosis not present

## 2020-12-05 DIAGNOSIS — E538 Deficiency of other specified B group vitamins: Secondary | ICD-10-CM | POA: Diagnosis not present

## 2020-12-05 DIAGNOSIS — K7581 Nonalcoholic steatohepatitis (NASH): Secondary | ICD-10-CM | POA: Diagnosis not present

## 2020-12-05 DIAGNOSIS — D509 Iron deficiency anemia, unspecified: Secondary | ICD-10-CM | POA: Diagnosis not present

## 2020-12-05 DIAGNOSIS — K7460 Unspecified cirrhosis of liver: Secondary | ICD-10-CM | POA: Diagnosis not present

## 2020-12-05 DIAGNOSIS — Z79899 Other long term (current) drug therapy: Secondary | ICD-10-CM | POA: Diagnosis not present

## 2020-12-05 MED ORDER — VITAMIN B-12 1000 MCG PO TABS: 1000.0000 ug | ORAL_TABLET | Freq: Every day | ORAL | 3 refills | Status: DC

## 2020-12-05 NOTE — Patient Instructions (Signed)
[place] at [facility] Discharge Instructions  You were seen today by Tarri Abernethy PA-C for your low platelets and your anemia.  Your platelets remain low, but within their baseline range.  Your low platelets (also called "thrombocytopenia") is related to your cirrhosis and your enlarged liver.  You do not need treatment for your low platelets at this time, but we will continue to monitor it to make sure there are no major changes.  Your blood levels remain stable, but your iron level is slightly lower than your target level.  Per your request, we will hold off on giving any IV iron at this time, and we will check your levels again at your next visit.  Your B12 levels were low, so a prescription has been sent for you to start taking vitamin B12 (cyanocobalamin) 1000 mcg daily.  You can also get this medication over-the-counter if that is cheaper.  LABS: Return in 4 months for repeat labs  OTHER TESTS: None at this time  MEDICATIONS: START taking vitamin B12 (cyanocobalamin) 1 tablet (1000 mcg) daily.  FOLLOW-UP APPOINTMENT: Office visit in 4 months, after labs.   Thank you for choosing [place] at [facility] to provide your oncology and hematology care.  To afford each patient quality time with our provider, please arrive at least 15 minutes before your scheduled appointment time.   If you have a lab appointment with the [facility] please come in thru the Main Entrance and check in at the main information desk.  You need to re-schedule your appointment should you arrive 10 or more minutes late.  We strive to give you quality time with our providers, and arriving late affects you and other patients whose appointments are after yours.  Also, if you no show three or more times for appointments you may be dismissed from the clinic at the providers discretion.     Again, thank you for choosing [facility].  Our hope is that  these requests will decrease the amount of time that you wait before being seen by our physicians.       _____________________________________________________________  Should you have questions after your visit to [facility], please contact our office at [phone] and follow the prompts.  Our office hours are 8:00 a.m. and 4:30 p.m. Monday - Friday.  Please note that voicemails left after 4:00 p.m. may not be returned until the following business day.  We are closed weekends and major holidays.  You do have access to a nurse 24-7, just call the main number to the clinic [phone] and do not press any options, hold on the line and a nurse will answer the phone.    For prescription refill requests, have your pharmacy contact our office and allow 72 hours.    Due to Covid, you will need to wear a mask upon entering the hospital. If you do not have a mask, a mask will be given to you at the Main Entrance upon arrival. For doctor visits, patients may have 1 support person age 22 or older with them. For treatment visits, patients can not have anyone with them due to social distancing guidelines and our immunocompromised population.

## 2020-12-12 DIAGNOSIS — K7460 Unspecified cirrhosis of liver: Secondary | ICD-10-CM | POA: Diagnosis not present

## 2020-12-12 DIAGNOSIS — D696 Thrombocytopenia, unspecified: Secondary | ICD-10-CM | POA: Diagnosis not present

## 2020-12-12 DIAGNOSIS — Z86010 Personal history of colonic polyps: Secondary | ICD-10-CM | POA: Diagnosis not present

## 2020-12-12 DIAGNOSIS — K5904 Chronic idiopathic constipation: Secondary | ICD-10-CM | POA: Diagnosis not present

## 2020-12-13 ENCOUNTER — Other Ambulatory Visit: Payer: Self-pay | Admitting: Gastroenterology

## 2020-12-13 DIAGNOSIS — Z8719 Personal history of other diseases of the digestive system: Secondary | ICD-10-CM

## 2020-12-17 ENCOUNTER — Encounter (HOSPITAL_COMMUNITY): Payer: Self-pay | Admitting: Hematology

## 2020-12-18 ENCOUNTER — Ambulatory Visit
Admission: RE | Admit: 2020-12-18 | Discharge: 2020-12-18 | Disposition: A | Discharge status: Home / Self Care | Payer: Medicare Other | Source: Ambulatory Visit | Attending: Gastroenterology | Admitting: Gastroenterology

## 2020-12-18 ENCOUNTER — Encounter (HOSPITAL_COMMUNITY): Payer: Self-pay | Admitting: Hematology

## 2020-12-18 DIAGNOSIS — K7460 Unspecified cirrhosis of liver: Secondary | ICD-10-CM | POA: Diagnosis not present

## 2020-12-18 DIAGNOSIS — Z8719 Personal history of other diseases of the digestive system: Secondary | ICD-10-CM

## 2020-12-25 ENCOUNTER — Encounter (HOSPITAL_COMMUNITY): Payer: Self-pay | Admitting: Hematology

## 2020-12-30 ENCOUNTER — Encounter (HOSPITAL_COMMUNITY): Payer: Self-pay | Admitting: Hematology

## 2021-01-17 ENCOUNTER — Ambulatory Visit: Payer: Medicare Other | Admitting: Obstetrics & Gynecology

## 2021-01-27 ENCOUNTER — Other Ambulatory Visit: Payer: Self-pay

## 2021-01-27 ENCOUNTER — Encounter: Payer: Self-pay | Admitting: Obstetrics & Gynecology

## 2021-01-27 ENCOUNTER — Ambulatory Visit: Payer: Medicare Other | Admitting: Obstetrics & Gynecology

## 2021-01-27 VITALS — BP 110/46 | HR 53 | Ht 66.0 in | Wt 192.0 lb

## 2021-01-27 DIAGNOSIS — Z4689 Encounter for fitting and adjustment of other specified devices: Secondary | ICD-10-CM | POA: Diagnosis not present

## 2021-01-27 DIAGNOSIS — R339 Retention of urine, unspecified: Secondary | ICD-10-CM | POA: Diagnosis not present

## 2021-01-27 DIAGNOSIS — N993 Prolapse of vaginal vault after hysterectomy: Secondary | ICD-10-CM | POA: Diagnosis not present

## 2021-01-27 NOTE — Progress Notes (Signed)
Chief Complaint  Patient presents with   Pessasry Maintenance    Blood pressure (!) 110/46, pulse (!) 53, height 5' 6"  (1.676 m), weight 192 lb (87.1 kg).  [name] presents today for routine follow up related to her pessary.   She uses a Gelhorn 2 3/4 inch She reports no vaginal discharge and no vaginal bleeding   Likert scale(1 not bothersome -5 very bothersome)  :  1  Exam reveals no undue vaginal mucosal pressure of breakdown, no discharge and no vaginal bleeding.  Vaginal Epithelial Abnormality Classification System:   0 0    No abnormalities 1    Epithelial erythema 2    Granulation tissue 3    Epithelial break or erosion, 1 cm or less 4    Epithelial break or erosion, 1 cm or greater  The pessary is removed, cleaned and replaced without difficulty.      ICD-10-CM   1. Pessary maintenance< Gelhorn pessary 2 3/4 inch, original fit 8/21  Z46.89     2. 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 Urinary retention due to kinking of the urethra from vault prolapse, resolved with Gelhorn  R33.9        [name] will be sen back in 4 months for continued follow up.  Florian Buff, MD  01/27/2021 2:47 PM

## 2021-02-10 ENCOUNTER — Encounter (INDEPENDENT_AMBULATORY_CARE_PROVIDER_SITE_OTHER): Payer: Medicare Other | Admitting: Ophthalmology

## 2021-02-10 ENCOUNTER — Other Ambulatory Visit: Payer: Self-pay

## 2021-02-10 DIAGNOSIS — E113391 Type 2 diabetes mellitus with moderate nonproliferative diabetic retinopathy without macular edema, right eye: Secondary | ICD-10-CM

## 2021-02-10 DIAGNOSIS — E113512 Type 2 diabetes mellitus with proliferative diabetic retinopathy with macular edema, left eye: Secondary | ICD-10-CM

## 2021-02-10 DIAGNOSIS — H43813 Vitreous degeneration, bilateral: Secondary | ICD-10-CM

## 2021-02-19 ENCOUNTER — Encounter (HOSPITAL_COMMUNITY): Payer: Self-pay | Admitting: Hematology

## 2021-03-12 ENCOUNTER — Encounter (HOSPITAL_COMMUNITY): Payer: Self-pay | Admitting: Hematology

## 2021-03-25 ENCOUNTER — Encounter (HOSPITAL_COMMUNITY): Payer: Self-pay | Admitting: Hematology

## 2021-04-03 ENCOUNTER — Other Ambulatory Visit: Payer: Self-pay

## 2021-04-03 ENCOUNTER — Inpatient Hospital Stay (HOSPITAL_COMMUNITY): Payer: Medicare Other | Attending: Hematology

## 2021-04-03 DIAGNOSIS — K7581 Nonalcoholic steatohepatitis (NASH): Secondary | ICD-10-CM | POA: Insufficient documentation

## 2021-04-03 DIAGNOSIS — E538 Deficiency of other specified B group vitamins: Secondary | ICD-10-CM | POA: Diagnosis not present

## 2021-04-03 DIAGNOSIS — R161 Splenomegaly, not elsewhere classified: Secondary | ICD-10-CM | POA: Insufficient documentation

## 2021-04-03 DIAGNOSIS — D696 Thrombocytopenia, unspecified: Secondary | ICD-10-CM | POA: Insufficient documentation

## 2021-04-03 DIAGNOSIS — K7460 Unspecified cirrhosis of liver: Secondary | ICD-10-CM | POA: Diagnosis not present

## 2021-04-03 DIAGNOSIS — R5383 Other fatigue: Secondary | ICD-10-CM | POA: Diagnosis not present

## 2021-04-03 DIAGNOSIS — N189 Chronic kidney disease, unspecified: Secondary | ICD-10-CM | POA: Insufficient documentation

## 2021-04-03 DIAGNOSIS — D509 Iron deficiency anemia, unspecified: Secondary | ICD-10-CM | POA: Diagnosis not present

## 2021-04-03 DIAGNOSIS — D500 Iron deficiency anemia secondary to blood loss (chronic): Secondary | ICD-10-CM

## 2021-04-03 LAB — CBC WITH DIFFERENTIAL/PLATELET
Abs Immature Granulocytes: 0.01 10*3/uL (ref 0.00–0.07)
Basophils Absolute: 0 10*3/uL (ref 0.0–0.1)
Basophils Relative: 0 %
Eosinophils Absolute: 0.3 10*3/uL (ref 0.0–0.5)
Eosinophils Relative: 5 %
HCT: 35.8 % — ABNORMAL LOW (ref 36.0–46.0)
Hemoglobin: 11.6 g/dL — ABNORMAL LOW (ref 12.0–15.0)
Immature Granulocytes: 0 %
Lymphocytes Relative: 21 %
Lymphs Abs: 1.1 10*3/uL (ref 0.7–4.0)
MCH: 32.3 pg (ref 26.0–34.0)
MCHC: 32.4 g/dL (ref 30.0–36.0)
MCV: 99.7 fL (ref 80.0–100.0)
Monocytes Absolute: 0.3 10*3/uL (ref 0.1–1.0)
Monocytes Relative: 6 %
Neutro Abs: 3.6 10*3/uL (ref 1.7–7.7)
Neutrophils Relative %: 68 %
Platelets: 47 10*3/uL — ABNORMAL LOW (ref 150–400)
RBC: 3.59 MIL/uL — ABNORMAL LOW (ref 3.87–5.11)
RDW: 14.8 % (ref 11.5–15.5)
WBC: 5.3 10*3/uL (ref 4.0–10.5)
nRBC: 0 % (ref 0.0–0.2)

## 2021-04-03 LAB — COMPREHENSIVE METABOLIC PANEL
ALT: 21 U/L (ref 0–44)
AST: 33 U/L (ref 15–41)
Albumin: 3 g/dL — ABNORMAL LOW (ref 3.5–5.0)
Alkaline Phosphatase: 110 U/L (ref 38–126)
Anion gap: 9 (ref 5–15)
BUN: 46 mg/dL — ABNORMAL HIGH (ref 8–23)
CO2: 25 mmol/L (ref 22–32)
Calcium: 8.2 mg/dL — ABNORMAL LOW (ref 8.9–10.3)
Chloride: 104 mmol/L (ref 98–111)
Creatinine, Ser: 1.4 mg/dL — ABNORMAL HIGH (ref 0.44–1.00)
GFR, Estimated: 41 mL/min — ABNORMAL LOW (ref >60)
Glucose, Bld: 215 mg/dL — ABNORMAL HIGH (ref 70–99)
Potassium: 3.7 mmol/L (ref 3.5–5.1)
Sodium: 138 mmol/L (ref 135–145)
Total Bilirubin: 1.2 mg/dL (ref 0.3–1.2)
Total Protein: 5.9 g/dL — ABNORMAL LOW (ref 6.5–8.1)

## 2021-04-03 LAB — IRON AND TIBC
Iron: 121 ug/dL (ref 28–170)
Saturation Ratios: 43 % — ABNORMAL HIGH (ref 10.4–31.8)
TIBC: 282 ug/dL (ref 250–450)
UIBC: 161 ug/dL

## 2021-04-03 LAB — VITAMIN B12: Vitamin B-12: 2555 pg/mL — ABNORMAL HIGH (ref 180–914)

## 2021-04-03 LAB — FERRITIN: Ferritin: 38 ng/mL (ref 11–307)

## 2021-04-06 LAB — METHYLMALONIC ACID, SERUM: Methylmalonic Acid, Quantitative: 337 nmol/L (ref 0–378)

## 2021-04-09 NOTE — Progress Notes (Signed)
[name], [place] [address]   CLINIC:  Medical Oncology/Hematology  PCP:  Brandy Squibb, MD [address] [name] [address] [phone]   REASON FOR VISIT:  Follow-up for thrombocytopenia and iron deficiency anemia in the setting of underlying cirrhosis   CURRENT THERAPY: Intermittent IV iron infusions (last Feraheme on 02/08/2020)   INTERVAL HISTORY:  [name] 71 y.o. female returns for routine follow-up of her thrombocytopenia and iron deficiency anemia in the setting of underlying liver cirrhosis.  She was last seen by [name] on 12/05/2020.  At today's visit, she reports feeling fair.  No recent hospitalizations, surgeries, or changes in baseline health status.  She admits to easy bruising, but denies any petechial rash   She has not noticed any blood loss such as epistaxis, bleeding gums, hematemesis, hematochezia, melena, or hematuria.  She has chronic and multifactorial fatigue, which is at baselines.  She has not had any pica, headaches, chest pain, dyspnea, or syncope.  She has little to no energy and 75% appetite. She endorses that she is maintaining a stable weight.   REVIEW OF SYSTEMS:  Review of Systems  Constitutional:  Positive for fatigue. Negative for appetite change, chills, diaphoresis, fever and unexpected weight change.  HENT:   Negative for lump/mass and nosebleeds.   Eyes:  Negative for eye problems.  Respiratory:  Negative for cough, hemoptysis and shortness of breath.   Cardiovascular:  Negative for chest pain, leg swelling and palpitations.  Gastrointestinal:  Negative for abdominal pain, blood in stool, constipation, diarrhea, nausea and vomiting.  Genitourinary:  Negative for hematuria.   Skin: Negative.   Neurological:  Positive for dizziness (feels "dizzy all the time, can't walk a straight line"). Negative for headaches and light-headedness.  Hematological:  Does not bruise/bleed easily.      PAST MEDICAL/SURGICAL HISTORY:  Past Medical History:  Diagnosis Date   Alkaline phosphatase elevation    Anemia    ASD (atrial septal defect)    Cataract    both eyes hx of   Chest pain    03-17-2013 last chest pain   Chronic kidney disease    Stage III kidney disease   Cirrhosis ([place])    Depression    Diabetes mellitus type II    Fatigue    GERD (gastroesophageal reflux disease)    Gout    Headache(784.0)    occasional   Hyperlipidemia    Hypothyroidism    Neuropathy    Peripheral neuropathy    Presence of pessary    Retinopathy    Past Surgical History:  Procedure Laterality Date   APPENDECTOMY     CATARACT EXTRACTION     CERVICAL SPINE SURGERY  2006   CHOLECYSTECTOMY N/A 04/07/2013   Procedure: LAPAROSCOPIC CHOLECYSTECTOMY WITH INTRAOPERATIVE CHOLANGIOGRAM;  Surgeon: Merrie Roof, MD;  Location: [facility];  Service: General;  Laterality: N/A;   COMBINED HYSTERECTOMY VAGINAL / OOPHORECTOMY / A&P REPAIR  1987   Unilateral oophorectomy, h/o uterine prolapse has right ovary   ERCP N/A 04/06/2013   Procedure: ENDOSCOPIC RETROGRADE CHOLANGIOPANCREATOGRAPHY (ERCP);  Surgeon: Beryle Beams, MD;  Location: [facility];  Service: Endoscopy;  Laterality: N/A;   ESOPHAGOGASTRODUODENOSCOPY (EGD) WITH PROPOFOL N/A 08/16/2020   Procedure: ESOPHAGOGASTRODUODENOSCOPY (EGD) WITH PROPOFOL;  Surgeon: Carol Ada, MD;  Location: [facility];  Service: Endoscopy;  Laterality: N/A;   EUS N/A 03/31/2013   Procedure: UPPER ENDOSCOPIC ULTRASOUND (EUS) LINEAR;  Surgeon: Beryle Beams,  MD;  Location: [facility];  Service: Endoscopy;  Laterality: N/A;   REFRACTIVE SURGERY     SPINAL FUSION     TONSILLECTOMY  age 25     SOCIAL HISTORY:  Social History   Socioeconomic History   Marital status: Divorced    Spouse name: Not on file   Number of children: 3   Years of education: 12   Highest education level: Not on file  Occupational History   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 Reports that her son recently moved out after living with her for a long time. She reports that she is happy to be living alone and feels like she was enabling his behavior. Reports that he had a history of drug use.   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    No pets.   Eats all food groups.    Wears seat belt.    Lives in house.    Smoke detectors.    Social Determinants of Health   Financial Resource Strain: Not on file  Food Insecurity: Not on file  Transportation Needs: Not on file  Physical Activity: Not on file  Stress: Not on file  Social Connections: Not on file  Intimate Partner Violence: Not on file    FAMILY HISTORY:  Family History  Problem Relation Age of Onset   COPD Mother    Lung cancer Father    Diabetes Sister    Cataracts Sister    Insulin resistance Daughter    Insulin resistance Son     CURRENT MEDICATIONS:  Outpatient Encounter Medications as of 04/10/2021  Medication Sig Note   allopurinol (ZYLOPRIM) 100 MG tablet Take 100 mg by mouth every evening.     Carboxymethylcellulose Sod PF 0.5 % SOLN Apply 1 drop to eye daily as needed (dry eyes).    Cholecalciferol (VITAMIN D) 125 MCG (5000 UT) CAPS Take 5,000 Units by mouth See admin instructions. Take 1 tablet every Wednesday.    hydrOXYzine (ATARAX/VISTARIL) 25 MG tablet Take 25 mg by mouth 3 (three) times daily as needed for anxiety or itching (Helps with neuropathy).     insulin regular (NOVOLIN R,HUMULIN R) 100 units/mL injection Inject 5 Units into the skin 2 (two) times  daily before a meal.    levothyroxine (SYNTHROID) 137 MCG tablet Take 137 mcg by mouth daily.    nadolol (CORGARD) 40 MG tablet Take 40 mg by mouth daily.    NOVOLIN N RELION 100 UNIT/ML injection 12-14 Units 2 (two) times daily before a meal.    ONETOUCH ULTRA test strip     torsemide (DEMADEX) 20 MG tablet Take 20 mg by mouth 2 (two) times daily.    vitamin B-12 (CYANOCOBALAMIN) 1000 MCG tablet Take 1 tablet (1,000 mcg total) by mouth daily.    Vitamin D, Ergocalciferol, (DRISDOL) 1.25 MG (50000 UT) CAPS capsule Take 50,000 Units by mouth once a week. 08/08/2020: Saturday   No facility-administered encounter  medications on file as of 04/10/2021.    ALLERGIES:  Allergies  Allergen Reactions   Atorvastatin     Other reaction(s): arthralgia/myalgia   Ciprofloxacin     Other reaction(s): severe yeast infection   Crestor [Rosuvastatin]     Other reaction(s): arthralgia/myalgia   Ezetimibe     Other reaction(s): Unknown   Lisinopril     Other reaction(s): cough   Losartan Potassium     Other reaction(s): cough   Lovastatin     Other reaction(s): arthralgia/myalgia   Statins Other (See Comments)    REACTION: diarrhea/joint pain/muscle aches. Tried at low doses.  Other reaction(s): arthralgia/myalgia   Welchol [Colesevelam]     Other reaction(s): arthralgia/myalgia   Dilaudid [Hydromorphone Hcl] Nausea And Vomiting    After one vomiting episode no further vomiting     PHYSICAL EXAM:  ECOG PERFORMANCE STATUS: 1 - Symptomatic but completely ambulatory  There were no vitals filed for this visit. There were no vitals filed for this visit. Physical Exam Constitutional:      Appearance: Normal appearance. She is obese.  HENT:     Head: Normocephalic and atraumatic.     Mouth/Throat:     Mouth: Mucous membranes are moist.  Eyes:     Extraocular Movements: Extraocular movements intact.     Pupils: Pupils are equal, round, and reactive to light.  Cardiovascular:     Rate and  Rhythm: Regular rhythm. Bradycardia present.     Pulses: Normal pulses.     Heart sounds: Normal heart sounds.  Pulmonary:     Effort: Pulmonary effort is normal.     Breath sounds: Normal breath sounds.  Abdominal:     General: Bowel sounds are normal.     Palpations: Abdomen is soft.     Tenderness: There is no abdominal tenderness.  Musculoskeletal:        General: No swelling.     Right lower leg: No edema.     Left lower leg: No edema.  Lymphadenopathy:     Cervical: No cervical adenopathy.  Skin:    General: Skin is warm and dry.  Neurological:     General: No focal deficit present.     Mental Status: She is alert and oriented to person, place, and time.  Psychiatric:        Mood and Affect: Mood normal.        Behavior: Behavior normal.     LABORATORY DATA:  I have reviewed the labs as listed.  CBC    Component Value Date/Time   WBC 5.3 04/03/2021 1400   RBC 3.59 (L) 04/03/2021 1400   HGB 11.6 (L) 04/03/2021 1400   HCT 35.8 (L) 04/03/2021 1400   PLT 47 (L) 04/03/2021 1400   MCV 99.7 04/03/2021 1400   MCH 32.3 04/03/2021 1400   MCHC 32.4 04/03/2021 1400   RDW 14.8 04/03/2021 1400   LYMPHSABS 1.1 04/03/2021 1400   MONOABS 0.3 04/03/2021 1400   EOSABS 0.3 04/03/2021 1400   BASOSABS 0.0 04/03/2021 1400   CMP Latest Ref Rng & Units 04/03/2021 08/21/2020 07/16/2020  Glucose 70 - 99 mg/dL 215(H) 141(H) 145(H)  BUN 8 - 23 mg/dL 46(H) 28(H) 36(H)  Creatinine 0.44 - 1.00 mg/dL 1.40(H) 1.12(H) 1.35(H)  Sodium 135 - 145 mmol/L 138 137 139  Potassium 3.5 - 5.1 mmol/L 3.7 4.2 3.5  Chloride 98 - 111 mmol/L 104 103 105  CO2 22 - 32 mmol/L 25 27 25   Calcium 8.9 - 10.3 mg/dL 8.2(L)  8.7(L) 8.4(L)  Total Protein 6.5 - 8.1 g/dL 5.9(L) 6.9 -  Total Bilirubin 0.3 - 1.2 mg/dL 1.2 1.5(H) -  Alkaline Phos 38 - 126 U/L 110 111 -  AST 15 - 41 U/L 33 36 -  ALT 0 - 44 U/L 21 23 -    DIAGNOSTIC IMAGING:  I have independently reviewed the relevant imaging and discussed with the  patient.  ASSESSMENT & PLAN: 1.  Moderate thrombocytopenia: - Her CBC on 07/06/2017 shows a platelet count of 66.  She has mild thrombocytopenia, mostly above 100 since 2015.  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 - Differential diagnosis favors thrombocytopenia secondary to splenomegaly, less likely to be immune mediated thrombocytopenia or possible MDS. -Nutritional deficiency work-up was negative.  ANA was 1:80 speckled pattern positive, although weakly.  She does not have any clinical signs or symptoms of lupus.  We considered it is false-positive. - Review of most recent labs (04/03/2021): Platelets 47, at baseline - Denies any current signs or symptoms of bleeding does have some mild easy bruising without petechial rash    - PLAN:   Platelets remained stable at baseline.  No indication for treatment at this time.  Labs and RTC in 4 months.   2.  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 - Most recent labs (04/03/2021): Hgb 11.6/MCV 99.7, ferritin 38, iron saturation 43%  - PLAN:   Recommend IV Feraheme x2 to keep ferritin at goal > 50, HOWEVER, patient has declined IV iron at this time due to financial constraints.  Repeat labs and RTC in 4 months.   3.  B12 deficiency - Patient was started on vitamin B12 supplementation in September 2022 due to elevated methylmalonic acid - Most recent labs (04/03/2021): Vitamin B12 elevated at 2555, with normalized methylmalonic acid - PLAN: We will decrease vitamin B12 cyanocobalamin to 500 mcg daily.  Recheck B12/MMI in 4  months.   All questions were answered. The patient knows to call the clinic with any problems, questions or concerns.  Medical decision making: Moderate  Time spent on visit: I spent 20 minutes counseling the patient face to face. The total time spent in the appointment was 30 minutes and more than 50% was on counseling.   Harriett Rush, PA-C  04/10/21 4:18 PM

## 2021-04-10 ENCOUNTER — Inpatient Hospital Stay (HOSPITAL_COMMUNITY): Payer: Medicare Other | Admitting: Physician Assistant

## 2021-04-10 ENCOUNTER — Other Ambulatory Visit: Payer: Self-pay

## 2021-04-10 VITALS — BP 130/48 | HR 57 | Temp 97.6°F | Resp 18 | Ht 66.0 in | Wt 194.9 lb

## 2021-04-10 DIAGNOSIS — N1831 Chronic kidney disease, stage 3a: Secondary | ICD-10-CM | POA: Diagnosis not present

## 2021-04-10 DIAGNOSIS — K7581 Nonalcoholic steatohepatitis (NASH): Secondary | ICD-10-CM | POA: Diagnosis not present

## 2021-04-10 DIAGNOSIS — D696 Thrombocytopenia, unspecified: Secondary | ICD-10-CM | POA: Diagnosis not present

## 2021-04-10 DIAGNOSIS — D500 Iron deficiency anemia secondary to blood loss (chronic): Secondary | ICD-10-CM

## 2021-04-10 DIAGNOSIS — K7460 Unspecified cirrhosis of liver: Secondary | ICD-10-CM | POA: Diagnosis not present

## 2021-04-10 DIAGNOSIS — R161 Splenomegaly, not elsewhere classified: Secondary | ICD-10-CM | POA: Diagnosis not present

## 2021-04-10 DIAGNOSIS — R5383 Other fatigue: Secondary | ICD-10-CM | POA: Diagnosis not present

## 2021-04-10 DIAGNOSIS — D509 Iron deficiency anemia, unspecified: Secondary | ICD-10-CM | POA: Diagnosis not present

## 2021-04-10 DIAGNOSIS — E538 Deficiency of other specified B group vitamins: Secondary | ICD-10-CM

## 2021-04-10 DIAGNOSIS — N189 Chronic kidney disease, unspecified: Secondary | ICD-10-CM | POA: Diagnosis not present

## 2021-04-10 NOTE — Patient Instructions (Signed)
[place] at [facility] Discharge Instructions  You were seen today by Tarri Abernethy PA-C for your low platelets, iron deficiency anemia, and low vitamin B12.  Your iron levels are borderline low - we could give you some IV iron, but per your request, we will wait and see how your levels look at your next visit.  Your blood and platelets are still low, but are stable at their usual baseline.  Your B12 level is slightly higher - you can DECREASE your B12 to every other day (instead of daily).    LABS: Return in 4 months for repeat labs   OTHER TESTS: None  FOLLOW-UP APPOINTMENT: Office visit in 4 months, after labs   Thank you for choosing [place] at [facility] to provide your oncology and hematology care.  To afford each patient quality time with our provider, please arrive at least 15 minutes before your scheduled appointment time.   If you have a lab appointment with the [facility] please come in thru the Main Entrance and check in at the main information desk.  You need to re-schedule your appointment should you arrive 10 or more minutes late.  We strive to give you quality time with our providers, and arriving late affects you and other patients whose appointments are after yours.  Also, if you no show three or more times for appointments you may be dismissed from the clinic at the providers discretion.     Again, thank you for choosing [facility].  Our hope is that these requests will decrease the amount of time that you wait before being seen by our physicians.       _____________________________________________________________  Should you have questions after your visit to [facility], please contact our office at [phone] and follow the prompts.  Our office hours are 8:00 a.m. and 4:30 p.m. Monday - Friday.  Please note that voicemails left after 4:00 p.m. may not be returned until the  following business day.  We are closed weekends and major holidays.  You do have access to a nurse 24-7, just call the main number to the clinic [phone] and do not press any options, hold on the line and a nurse will answer the phone.    For prescription refill requests, have your pharmacy contact our office and allow 72 hours.    Due to Covid, you will need to wear a mask upon entering the hospital. If you do not have a mask, a mask will be given to you at the Main Entrance upon arrival. For doctor visits, patients may have 1 support person age 12 or older with them. For treatment visits, patients can not have anyone with them due to social distancing guidelines and our immunocompromised population.

## 2021-04-18 DIAGNOSIS — M898X9 Other specified disorders of bone, unspecified site: Secondary | ICD-10-CM | POA: Diagnosis not present

## 2021-04-18 DIAGNOSIS — I129 Hypertensive chronic kidney disease with stage 1 through stage 4 chronic kidney disease, or unspecified chronic kidney disease: Secondary | ICD-10-CM | POA: Diagnosis not present

## 2021-04-18 DIAGNOSIS — D631 Anemia in chronic kidney disease: Secondary | ICD-10-CM | POA: Diagnosis not present

## 2021-04-18 DIAGNOSIS — N1831 Chronic kidney disease, stage 3a: Secondary | ICD-10-CM | POA: Diagnosis not present

## 2021-04-28 ENCOUNTER — Encounter (HOSPITAL_COMMUNITY): Payer: Self-pay | Admitting: Hematology

## 2021-04-28 ENCOUNTER — Other Ambulatory Visit: Payer: Self-pay

## 2021-04-28 ENCOUNTER — Encounter: Payer: Self-pay | Admitting: Obstetrics & Gynecology

## 2021-04-28 ENCOUNTER — Ambulatory Visit: Payer: Medicare Other | Admitting: Obstetrics & Gynecology

## 2021-04-28 VITALS — BP 128/54 | HR 55 | Ht 66.0 in | Wt 197.0 lb

## 2021-04-28 DIAGNOSIS — N993 Prolapse of vaginal vault after hysterectomy: Secondary | ICD-10-CM

## 2021-04-28 DIAGNOSIS — Z4689 Encounter for fitting and adjustment of other specified devices: Secondary | ICD-10-CM | POA: Diagnosis not present

## 2021-04-28 DIAGNOSIS — L304 Erythema intertrigo: Secondary | ICD-10-CM | POA: Diagnosis not present

## 2021-04-28 MED ORDER — GERHARDT'S BUTT CREAM: 1.0000 "application " | TOPICAL_CREAM | Freq: Three times a day (TID) | CUTANEOUS | 11 refills | Status: DC

## 2021-04-28 NOTE — Progress Notes (Signed)
Chief Complaint  Patient presents with   Pessary Check    Blood pressure (!) 128/54, pulse (!) 55, height 5' 6"  (1.676 m), weight 197 lb (89.4 kg).  [name] presents today for routine follow up related to her pessary.   She uses a Gelhorn 2 3/4 inch She reports no vaginal discharge and no vaginal bleeding   Likert scale(1 not bothersome -5 very bothersome)  :  1  Exam reveals no undue vaginal mucosal pressure of breakdown, no discharge and no vaginal bleeding.  Vaginal Epithelial Abnormality Classification System:   0 0    No abnormalities 1    Epithelial erythema 2    Granulation tissue 3    Epithelial break or erosion, 1 cm or less 4    Epithelial break or erosion, 1 cm or greater  The pessary is removed, cleaned and replaced without difficulty.      ICD-10-CM   1. Pessary maintenance< Gelhorn pessary 2 3/4 inch, original fit 8/21  Z46.89     2. 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 Intertriginous dermatitis associated with moisture  L30.4      Pt has significant moisture changes intertriginous, GV applied as well  Meds ordered this encounter  Medications   Nystatin (GERHARDT'S BUTT CREAM) CREA    Sig: Apply 1 application topically 3 (three) times daily.    Dispense:  50 each    Refill:  [name] will be sen back in 4 months for continued follow up.  Florian Buff, MD  04/28/2021 2:28 PM

## 2021-05-01 ENCOUNTER — Encounter (HOSPITAL_COMMUNITY): Payer: Self-pay | Admitting: Hematology

## 2021-05-05 ENCOUNTER — Other Ambulatory Visit: Payer: Self-pay

## 2021-05-05 ENCOUNTER — Encounter (INDEPENDENT_AMBULATORY_CARE_PROVIDER_SITE_OTHER): Payer: Medicare Other | Admitting: Ophthalmology

## 2021-05-05 DIAGNOSIS — E113512 Type 2 diabetes mellitus with proliferative diabetic retinopathy with macular edema, left eye: Secondary | ICD-10-CM

## 2021-05-05 DIAGNOSIS — E113391 Type 2 diabetes mellitus with moderate nonproliferative diabetic retinopathy without macular edema, right eye: Secondary | ICD-10-CM

## 2021-05-05 DIAGNOSIS — H43813 Vitreous degeneration, bilateral: Secondary | ICD-10-CM

## 2021-05-12 DIAGNOSIS — H17822 Peripheral opacity of cornea, left eye: Secondary | ICD-10-CM | POA: Diagnosis not present

## 2021-05-12 DIAGNOSIS — H10822 Rosacea conjunctivitis, left eye: Secondary | ICD-10-CM | POA: Diagnosis not present

## 2021-05-27 DIAGNOSIS — I129 Hypertensive chronic kidney disease with stage 1 through stage 4 chronic kidney disease, or unspecified chronic kidney disease: Secondary | ICD-10-CM | POA: Diagnosis not present

## 2021-05-27 DIAGNOSIS — E7800 Pure hypercholesterolemia, unspecified: Secondary | ICD-10-CM | POA: Diagnosis not present

## 2021-05-27 DIAGNOSIS — E1165 Type 2 diabetes mellitus with hyperglycemia: Secondary | ICD-10-CM | POA: Diagnosis not present

## 2021-05-27 DIAGNOSIS — N183 Chronic kidney disease, stage 3 unspecified: Secondary | ICD-10-CM | POA: Diagnosis not present

## 2021-05-27 DIAGNOSIS — E039 Hypothyroidism, unspecified: Secondary | ICD-10-CM | POA: Diagnosis not present

## 2021-05-29 DIAGNOSIS — H524 Presbyopia: Secondary | ICD-10-CM | POA: Diagnosis not present

## 2021-05-29 DIAGNOSIS — E113512 Type 2 diabetes mellitus with proliferative diabetic retinopathy with macular edema, left eye: Secondary | ICD-10-CM | POA: Diagnosis not present

## 2021-05-29 DIAGNOSIS — H10822 Rosacea conjunctivitis, left eye: Secondary | ICD-10-CM | POA: Diagnosis not present

## 2021-05-29 DIAGNOSIS — Z961 Presence of intraocular lens: Secondary | ICD-10-CM | POA: Diagnosis not present

## 2021-05-29 DIAGNOSIS — E113391 Type 2 diabetes mellitus with moderate nonproliferative diabetic retinopathy without macular edema, right eye: Secondary | ICD-10-CM | POA: Diagnosis not present

## 2021-05-29 DIAGNOSIS — H52223 Regular astigmatism, bilateral: Secondary | ICD-10-CM | POA: Diagnosis not present

## 2021-05-29 DIAGNOSIS — H17822 Peripheral opacity of cornea, left eye: Secondary | ICD-10-CM | POA: Diagnosis not present

## 2021-06-02 DIAGNOSIS — M109 Gout, unspecified: Secondary | ICD-10-CM | POA: Diagnosis not present

## 2021-06-02 DIAGNOSIS — E782 Mixed hyperlipidemia: Secondary | ICD-10-CM | POA: Diagnosis not present

## 2021-06-04 DIAGNOSIS — E11319 Type 2 diabetes mellitus with unspecified diabetic retinopathy without macular edema: Secondary | ICD-10-CM | POA: Diagnosis not present

## 2021-06-04 DIAGNOSIS — D696 Thrombocytopenia, unspecified: Secondary | ICD-10-CM | POA: Diagnosis not present

## 2021-06-04 DIAGNOSIS — K7460 Unspecified cirrhosis of liver: Secondary | ICD-10-CM | POA: Diagnosis not present

## 2021-06-04 DIAGNOSIS — K219 Gastro-esophageal reflux disease without esophagitis: Secondary | ICD-10-CM | POA: Diagnosis not present

## 2021-06-04 DIAGNOSIS — M109 Gout, unspecified: Secondary | ICD-10-CM | POA: Diagnosis not present

## 2021-06-04 DIAGNOSIS — J309 Allergic rhinitis, unspecified: Secondary | ICD-10-CM | POA: Diagnosis not present

## 2021-06-04 DIAGNOSIS — K5900 Constipation, unspecified: Secondary | ICD-10-CM | POA: Diagnosis not present

## 2021-06-04 DIAGNOSIS — E782 Mixed hyperlipidemia: Secondary | ICD-10-CM | POA: Diagnosis not present

## 2021-06-04 DIAGNOSIS — E559 Vitamin D deficiency, unspecified: Secondary | ICD-10-CM | POA: Diagnosis not present

## 2021-06-04 DIAGNOSIS — E039 Hypothyroidism, unspecified: Secondary | ICD-10-CM | POA: Diagnosis not present

## 2021-06-04 DIAGNOSIS — N189 Chronic kidney disease, unspecified: Secondary | ICD-10-CM | POA: Diagnosis not present

## 2021-06-12 DIAGNOSIS — K7581 Nonalcoholic steatohepatitis (NASH): Secondary | ICD-10-CM | POA: Diagnosis not present

## 2021-06-12 DIAGNOSIS — Z86010 Personal history of colonic polyps: Secondary | ICD-10-CM | POA: Diagnosis not present

## 2021-06-12 DIAGNOSIS — K7460 Unspecified cirrhosis of liver: Secondary | ICD-10-CM | POA: Diagnosis not present

## 2021-06-12 DIAGNOSIS — I8510 Secondary esophageal varices without bleeding: Secondary | ICD-10-CM | POA: Diagnosis not present

## 2021-07-28 ENCOUNTER — Encounter (INDEPENDENT_AMBULATORY_CARE_PROVIDER_SITE_OTHER): Payer: Medicare Other | Admitting: Ophthalmology

## 2021-07-28 ENCOUNTER — Encounter (HOSPITAL_COMMUNITY): Payer: Self-pay | Admitting: Hematology

## 2021-07-28 ENCOUNTER — Ambulatory Visit: Payer: Medicare Other | Admitting: Obstetrics & Gynecology

## 2021-07-28 DIAGNOSIS — H43813 Vitreous degeneration, bilateral: Secondary | ICD-10-CM

## 2021-07-28 DIAGNOSIS — E113512 Type 2 diabetes mellitus with proliferative diabetic retinopathy with macular edema, left eye: Secondary | ICD-10-CM

## 2021-07-28 DIAGNOSIS — E113391 Type 2 diabetes mellitus with moderate nonproliferative diabetic retinopathy without macular edema, right eye: Secondary | ICD-10-CM

## 2021-07-29 ENCOUNTER — Ambulatory Visit: Payer: Medicare Other | Admitting: Obstetrics & Gynecology

## 2021-08-04 ENCOUNTER — Encounter: Payer: Self-pay | Admitting: Obstetrics & Gynecology

## 2021-08-04 ENCOUNTER — Encounter (HOSPITAL_COMMUNITY): Payer: Self-pay | Admitting: Hematology

## 2021-08-04 ENCOUNTER — Ambulatory Visit: Payer: Medicare Other | Admitting: Obstetrics & Gynecology

## 2021-08-04 VITALS — BP 111/51 | HR 59 | Wt 198.0 lb

## 2021-08-04 DIAGNOSIS — Z4689 Encounter for fitting and adjustment of other specified devices: Secondary | ICD-10-CM | POA: Diagnosis not present

## 2021-08-04 NOTE — Progress Notes (Signed)
Chief Complaint  ?Patient presents with  ? Pessary maintenance  ? ? ?Blood pressure (!) 111/51, pulse (!) 59, weight 198 lb (89.8 kg). ? ?[name] presents today for routine follow up related to her pessary.   ?She uses a Gelhorn 2 3/4 inch ?She reports no vaginal discharge and no vaginal bleeding  ? ?Likert scale(1 not bothersome -5 very bothersome)  :  1 ? ?Exam reveals no undue vaginal mucosal pressure of breakdown, no discharge and no vaginal bleeding. ? ?Vaginal Epithelial Abnormality Classification System:   0 ?0    No abnormalities ?1    Epithelial erythema ?2    Granulation tissue ?3    Epithelial break or erosion, 1 cm or less ?4    Epithelial break or erosion, 1 cm or greater ? ?The pessary is removed, cleaned and replaced without difficulty.   ? ?  ICD-10-CM   ?1. Pessary maintenance< Gelhorn pessary 2 3/4 inch, original fit 8/21  Z46.89   ? stable, no mucosal breakdown, removed-->cleaned-->replaced  ?  ?  ? ?[name] will be sen back in 4 months for continued follow up. ? ?Florian Buff, MD  ?08/04/2021 ?3:48 PM ? ? ? ?

## 2021-08-08 ENCOUNTER — Other Ambulatory Visit (HOSPITAL_COMMUNITY): Payer: Medicare Other

## 2021-08-11 ENCOUNTER — Inpatient Hospital Stay (HOSPITAL_COMMUNITY): Payer: Medicare Other | Attending: Hematology

## 2021-08-11 DIAGNOSIS — D696 Thrombocytopenia, unspecified: Secondary | ICD-10-CM | POA: Insufficient documentation

## 2021-08-11 DIAGNOSIS — K7460 Unspecified cirrhosis of liver: Secondary | ICD-10-CM | POA: Insufficient documentation

## 2021-08-11 DIAGNOSIS — Z79899 Other long term (current) drug therapy: Secondary | ICD-10-CM | POA: Insufficient documentation

## 2021-08-11 DIAGNOSIS — D509 Iron deficiency anemia, unspecified: Secondary | ICD-10-CM | POA: Insufficient documentation

## 2021-08-11 DIAGNOSIS — D500 Iron deficiency anemia secondary to blood loss (chronic): Secondary | ICD-10-CM

## 2021-08-11 DIAGNOSIS — N189 Chronic kidney disease, unspecified: Secondary | ICD-10-CM | POA: Diagnosis not present

## 2021-08-11 DIAGNOSIS — E538 Deficiency of other specified B group vitamins: Secondary | ICD-10-CM | POA: Diagnosis not present

## 2021-08-11 LAB — CBC WITH DIFFERENTIAL/PLATELET
Abs Immature Granulocytes: 0.03 10*3/uL (ref 0.00–0.07)
Basophils Absolute: 0 10*3/uL (ref 0.0–0.1)
Basophils Relative: 1 %
Eosinophils Absolute: 0.3 10*3/uL (ref 0.0–0.5)
Eosinophils Relative: 5 %
HCT: 36.6 % (ref 36.0–46.0)
Hemoglobin: 12.1 g/dL (ref 12.0–15.0)
Immature Granulocytes: 0 %
Lymphocytes Relative: 21 %
Lymphs Abs: 1.4 10*3/uL (ref 0.7–4.0)
MCH: 32.3 pg (ref 26.0–34.0)
MCHC: 33.1 g/dL (ref 30.0–36.0)
MCV: 97.6 fL (ref 80.0–100.0)
Monocytes Absolute: 0.5 10*3/uL (ref 0.1–1.0)
Monocytes Relative: 7 %
Neutro Abs: 4.5 10*3/uL (ref 1.7–7.7)
Neutrophils Relative %: 66 %
Platelets: 57 10*3/uL — ABNORMAL LOW (ref 150–400)
RBC: 3.75 MIL/uL — ABNORMAL LOW (ref 3.87–5.11)
RDW: 14.9 % (ref 11.5–15.5)
WBC: 6.7 10*3/uL (ref 4.0–10.5)
nRBC: 0 % (ref 0.0–0.2)

## 2021-08-11 LAB — IRON AND TIBC
Iron: 71 ug/dL (ref 28–170)
Saturation Ratios: 26 % (ref 10.4–31.8)
TIBC: 278 ug/dL (ref 250–450)
UIBC: 207 ug/dL

## 2021-08-11 LAB — VITAMIN B12: Vitamin B-12: 2468 pg/mL — ABNORMAL HIGH (ref 180–914)

## 2021-08-11 LAB — FERRITIN: Ferritin: 57 ng/mL (ref 11–307)

## 2021-08-12 ENCOUNTER — Other Ambulatory Visit (HOSPITAL_COMMUNITY): Payer: Self-pay | Admitting: Internal Medicine

## 2021-08-12 DIAGNOSIS — Z1231 Encounter for screening mammogram for malignant neoplasm of breast: Secondary | ICD-10-CM

## 2021-08-13 LAB — METHYLMALONIC ACID, SERUM: Methylmalonic Acid, Quantitative: 302 nmol/L (ref 0–378)

## 2021-08-15 ENCOUNTER — Ambulatory Visit (HOSPITAL_COMMUNITY): Payer: Medicare Other | Admitting: Physician Assistant

## 2021-08-17 NOTE — Progress Notes (Signed)
[address], [place] [address]   CLINIC:  Medical Oncology/Hematology  PCP:  Celene Squibb, MD [place] [address] [phone]   REASON FOR VISIT:  Follow-up for thrombocytopenia and iron deficiency anemia in the setting of underlying cirrhosis   CURRENT THERAPY: Intermittent IV iron infusions (last Feraheme on 02/08/2020)   INTERVAL HISTORY:  [name] 87P.o. female returns for routine follow-up of her thrombocytopenia and iron deficiency anemia in the setting of underlying liver cirrhosis.  She was last seen by Tarri Abernethy PA-C on 04/10/2021.   At today's visit, she reports feeling  fair.  No recent hospitalizations, surgeries, or changes in baseline health status.   She admits to easy bruising, but denies any petechial rash.  She has not noticed any blood loss such as epistaxis, bleeding gums, hematemesis, hematochezia, melena, or hematuria.  She has chronic and multifactorial fatigue, which is at baseline. She has not had any pica, headaches, chest pain, dyspnea, or syncope.  She has been taking vitamin B12 1000 mcg every other day.  She does not take any iron tablets due to history of iron supplementation causing indigestion.  She has 60% energy and 75% appetite. She endorses that she is maintaining a stable weight.   REVIEW OF SYSTEMS:  Review of Systems  Constitutional:  Positive for fatigue. Negative for appetite change, chills, diaphoresis, fever and unexpected weight change.  HENT:   Negative for lump/mass and nosebleeds.   Eyes:  Negative for eye problems.  Respiratory:  Negative for cough, hemoptysis and shortness of breath.   Cardiovascular:  Negative for chest pain, leg swelling and palpitations.  Gastrointestinal:  Negative for abdominal pain, blood in stool, constipation, diarrhea, nausea and vomiting.  Genitourinary:  Negative for hematuria.   Skin: Negative.   Neurological:  Negative for  dizziness, headaches and light-headedness.  Hematological:  Bruises/bleeds easily.     PAST MEDICAL/SURGICAL HISTORY:  Past Medical History:  Diagnosis Date   Alkaline phosphatase elevation    Anemia    ASD (atrial septal defect)    Cataract    both eyes hx of   Chest pain    03-17-2013 last chest pain   Chronic kidney disease    Stage III kidney disease   Cirrhosis ([place])    Depression    Diabetes mellitus type II    Fatigue    GERD (gastroesophageal reflux disease)    Gout    Headache(784.0)    occasional   Hyperlipidemia    Hypothyroidism    Neuropathy    Peripheral neuropathy    Presence of pessary    Retinopathy    Past Surgical History:  Procedure Laterality Date   APPENDECTOMY     CATARACT EXTRACTION     CERVICAL SPINE SURGERY  2006   CHOLECYSTECTOMY N/A 04/07/2013   Procedure: LAPAROSCOPIC CHOLECYSTECTOMY WITH INTRAOPERATIVE CHOLANGIOGRAM;  Surgeon: Merrie Roof, MD;  Location: [facility];  Service: General;  Laterality: N/A;   COMBINED HYSTERECTOMY VAGINAL / OOPHORECTOMY / A&P REPAIR  1987   Unilateral oophorectomy, h/o uterine prolapse has right ovary   ERCP N/A 04/06/2013   Procedure: ENDOSCOPIC RETROGRADE CHOLANGIOPANCREATOGRAPHY (ERCP);  Surgeon: Beryle Beams, MD;  Location: [facility];  Service: Endoscopy;  Laterality: N/A;   ESOPHAGOGASTRODUODENOSCOPY (EGD) WITH PROPOFOL N/A 08/16/2020   Procedure: ESOPHAGOGASTRODUODENOSCOPY (EGD) WITH PROPOFOL;  Surgeon: Carol Ada, MD;  Location: [facility];  Service: Endoscopy;  Laterality: N/A;   EUS N/A 03/31/2013  Procedure: UPPER ENDOSCOPIC ULTRASOUND (EUS) LINEAR;  Surgeon: Beryle Beams, MD;  Location: [facility];  Service: Endoscopy;  Laterality: N/A;   REFRACTIVE SURGERY     SPINAL FUSION     TONSILLECTOMY  age 39     SOCIAL HISTORY:  Social History   Socioeconomic History   Marital status: Divorced    Spouse name: Not on file   Number of children: 3   Years of education: 12   Highest education  level: Not on file  Occupational History   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 Reports that her son recently moved out after living with her for a long time. She reports that she is happy to be living alone and feels like she was enabling his behavior. Reports that he had a history of drug use.   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    No pets.   Eats all food groups.    Wears seat belt.    Lives in house.    Smoke detectors.    Social Determinants of Health   Financial Resource Strain: Not on file  Food Insecurity: Not on file  Transportation Needs: Not on file  Physical Activity: Not on file  Stress: Not on file  Social Connections: Not on file  Intimate Partner Violence: Not on file    FAMILY HISTORY:  Family History  Problem Relation Age of Onset   COPD Mother    Lung cancer Father    Diabetes Sister    Cataracts Sister    Insulin resistance Daughter    Insulin resistance Son     CURRENT MEDICATIONS:  Outpatient Encounter Medications as of 08/18/2021  Medication Sig Note   allopurinol (ZYLOPRIM) 100 MG tablet Take 100 mg by mouth every evening.     Carboxymethylcellulose Sod PF 0.5 % SOLN Apply 1 drop to eye daily as needed (dry eyes). (Patient not taking: Reported on 04/10/2021)    Cholecalciferol (VITAMIN D) 125 MCG (5000 UT) CAPS Take 5,000 Units by mouth See admin instructions. Take 1 tablet every Wednesday.    hydrOXYzine (ATARAX/VISTARIL) 25 MG tablet Take 25 mg by mouth 3 (three) times daily as needed for anxiety or itching (Helps  with neuropathy).  (Patient not taking: Reported on 04/10/2021)    insulin regular (NOVOLIN R,HUMULIN R) 100 units/mL injection Inject 5 Units into the skin 2 (two) times daily before a meal.    levothyroxine (SYNTHROID) 137 MCG tablet Take 137 mcg by mouth daily.    nadolol (CORGARD) 40 MG tablet Take 40 mg by mouth daily.    NOVOLIN N RELION 100 UNIT/ML injection 12-14 Units 2 (two) times daily before a meal.    Nystatin (GERHARDT'S BUTT CREAM) CREA Apply 1 application topically 3 (three) times daily.    ONETOUCH ULTRA test strip     torsemide (DEMADEX) 20 MG tablet Take 20 mg by mouth 2 (two) times daily.    vitamin B-12 (CYANOCOBALAMIN) 1000  MCG tablet Take 1 tablet (1,000 mcg total) by mouth daily.    Vitamin D, Ergocalciferol, (DRISDOL) 1.25 MG (50000 UT) CAPS capsule Take 50,000 Units by mouth once a week. 08/08/2020: Saturday   No facility-administered encounter medications on file as of 08/18/2021.    ALLERGIES:  Allergies  Allergen Reactions   Atorvastatin     Other reaction(s): arthralgia/myalgia   Ciprofloxacin     Other reaction(s): severe yeast infection   Crestor [Rosuvastatin]     Other reaction(s): arthralgia/myalgia   Ezetimibe     Other reaction(s): Unknown   Lisinopril     Other reaction(s): cough   Losartan Potassium     Other reaction(s): cough   Lovastatin     Other reaction(s): arthralgia/myalgia   Statins Other (See Comments)    REACTION: diarrhea/joint pain/muscle aches. Tried at low doses.  Other reaction(s): arthralgia/myalgia   Welchol [Colesevelam]     Other reaction(s): arthralgia/myalgia   Dilaudid [Hydromorphone Hcl] Nausea And Vomiting    After one vomiting episode no further vomiting     PHYSICAL EXAM:  ECOG PERFORMANCE STATUS: 1 - Symptomatic but completely ambulatory  There were no vitals filed for this visit. There were no vitals filed for this visit. Physical Exam Constitutional:      Appearance: Normal appearance. She is obese.   HENT:     Head: Normocephalic and atraumatic.     Mouth/Throat:     Mouth: Mucous membranes are moist.  Eyes:     Extraocular Movements: Extraocular movements intact.     Pupils: Pupils are equal, round, and reactive to light.  Cardiovascular:     Rate and Rhythm: Normal rate and regular rhythm.     Pulses: Normal pulses.     Heart sounds: Normal heart sounds.  Pulmonary:     Effort: Pulmonary effort is normal.     Breath sounds: Normal breath sounds.  Abdominal:     General: Bowel sounds are normal.     Palpations: Abdomen is soft.     Tenderness: There is no abdominal tenderness.  Musculoskeletal:        General: No swelling.     Right lower leg: No edema.     Left lower leg: No edema.  Lymphadenopathy:     Cervical: No cervical adenopathy.  Skin:    General: Skin is warm and dry.     Findings: Bruising present.  Neurological:     General: No focal deficit present.     Mental Status: She is alert and oriented to person, place, and time.  Psychiatric:        Mood and Affect: Mood normal.        Behavior: Behavior normal.     LABORATORY DATA:  I have reviewed the labs as listed.  CBC    Component Value Date/Time   WBC 6.7 08/11/2021 1342   RBC 3.75 (L) 08/11/2021 1342   HGB 12.1 08/11/2021 1342   HCT 36.6 08/11/2021 1342   PLT 57 (L) 08/11/2021 1342   MCV 97.6 08/11/2021 1342   MCH 32.3 08/11/2021 1342   MCHC 33.1 08/11/2021 1342   RDW 14.9 08/11/2021 1342   LYMPHSABS 1.4 08/11/2021 1342   MONOABS 0.5 08/11/2021 1342   EOSABS 0.3 08/11/2021 1342   BASOSABS 0.0 08/11/2021 1342      Latest Ref Rng & Units 04/03/2021    2:00 PM 08/21/2020    2:17 PM 07/16/2020    2:45 PM  CMP  Glucose 70 - 99 mg/dL  215   141   145    BUN 8 - 23 mg/dL 46   28   36    Creatinine 0.44 - 1.00 mg/dL 1.40   1.12   1.35    Sodium 135 - 145 mmol/L 138   137   139    Potassium 3.5 - 5.1 mmol/L 3.7   4.2   3.5    Chloride 98 - 111 mmol/L 104   103   105    CO2 22 - 32 mmol/L 25    27   25     Calcium 8.9 - 10.3 mg/dL 8.2   8.7   8.4    Total Protein 6.5 - 8.1 g/dL 5.9   6.9     Total Bilirubin 0.3 - 1.2 mg/dL 1.2   1.5     Alkaline Phos 38 - 126 U/L 110   111     AST 15 - 41 U/L 33   36     ALT 0 - 44 U/L 21   23       DIAGNOSTIC IMAGING:  I have independently reviewed the relevant imaging and discussed with the patient.  ASSESSMENT & PLAN: 1.  Moderate thrombocytopenia: - Her CBC on 07/06/2017 shows a platelet count of 66.  She has mild thrombocytopenia, mostly above 100 since 2015.  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 - Differential diagnosis favors thrombocytopenia secondary to splenomegaly, less likely to be immune mediated thrombocytopenia or possible MDS. -Nutritional deficiency work-up was negative.  ANA was 1:80 speckled pattern positive, although weakly.  She does not have any clinical signs or symptoms of lupus.  We considered it likely to be false-positive. - Review of most recent labs (08/11/2021): Platelets 57, at baseline.  Normal WBC. - Denies any current signs or symptoms of bleeding does have some mild easy bruising without petechial rash      - PLAN:   Platelets remained stable at baseline.  No indication for treatment at this time.  Labs and RTC in 6 months.   2.  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   - Most recent labs (08/11/2021): Hgb 12.1/MCV 97.6, ferritin 57, iron saturation 26 %  - PLAN:   No strong indication for IV iron at this time.   - Could  consider alternative iron supplement (i.e. ferrous bisglycinate) in the future - Repeat labs and RTC in 6 months.   3.  B12 deficiency - Patient was started on vitamin B12 supplementation in September 2022 due to elevated methylmalonic acid - Most recent labs (08/11/2021): Vitamin B12 elevated at 2468, with normalized methylmalonic acid - She is currently taking vitamin B12 1000 mcg every other day - PLAN: We will decrease vitamin B12 cyanocobalamin to 1,000 ONCE WEEKLY.  Recheck B12/MMI in 6 months.   PLAN SUMMARY & DISPOSITION: Labs in 6 months RTC 1 week after labs  All questions were answered. The patient knows to call the clinic with any problems, questions or concerns.  Medical decision making:  Moderate  Time spent on visit: I spent 20 minutes counseling the patient face to face. The total time spent in the appointment was 30 minutes and more than 50% was on counseling.   Harriett Rush, PA-C  08/18/2021 5:48 PM

## 2021-08-18 ENCOUNTER — Inpatient Hospital Stay (HOSPITAL_BASED_OUTPATIENT_CLINIC_OR_DEPARTMENT_OTHER): Payer: Medicare Other | Admitting: Physician Assistant

## 2021-08-18 ENCOUNTER — Other Ambulatory Visit: Payer: Self-pay | Admitting: Gastroenterology

## 2021-08-18 ENCOUNTER — Encounter (HOSPITAL_COMMUNITY): Payer: Self-pay | Admitting: Physician Assistant

## 2021-08-18 VITALS — BP 138/60 | HR 62 | Temp 97.7°F | Resp 18 | Ht 66.0 in | Wt 196.0 lb

## 2021-08-18 DIAGNOSIS — D696 Thrombocytopenia, unspecified: Secondary | ICD-10-CM | POA: Diagnosis not present

## 2021-08-18 DIAGNOSIS — D500 Iron deficiency anemia secondary to blood loss (chronic): Secondary | ICD-10-CM

## 2021-08-18 DIAGNOSIS — D509 Iron deficiency anemia, unspecified: Secondary | ICD-10-CM | POA: Diagnosis not present

## 2021-08-18 DIAGNOSIS — Z79899 Other long term (current) drug therapy: Secondary | ICD-10-CM | POA: Diagnosis not present

## 2021-08-18 DIAGNOSIS — K7460 Unspecified cirrhosis of liver: Secondary | ICD-10-CM | POA: Diagnosis not present

## 2021-08-18 DIAGNOSIS — N189 Chronic kidney disease, unspecified: Secondary | ICD-10-CM | POA: Diagnosis not present

## 2021-08-18 DIAGNOSIS — E538 Deficiency of other specified B group vitamins: Secondary | ICD-10-CM

## 2021-08-18 NOTE — Patient Instructions (Addendum)
[place] at [facility] Discharge Instructions  You were seen today by Tarri Abernethy PA-C for your low platelets, iron deficiency anemia, and low vitamin B12.  Your iron levels are slightly improved.  You do not need any IV iron at this appointment.  Your hemoglobin and red blood cells are normal - you do not have any anemia at this visit.  Your platelets are still low, but are stable at their usual baseline.  Your B12 level is slightly higher - you can DECREASE your B12 to ONCE PER WEEK.  LABS: Return in 6 months for repeat labs   OTHER TESTS: None  FOLLOW-UP APPOINTMENT: Office visit in 6 months, after labs   Thank you for choosing [place] at [facility] to provide your oncology and hematology care.  To afford each patient quality time with our provider, please arrive at least 15 minutes before your scheduled appointment time.   If you have a lab appointment with the [facility] please come in thru the Main Entrance and check in at the main information desk.  You need to re-schedule your appointment should you arrive 10 or more minutes late.  We strive to give you quality time with our providers, and arriving late affects you and other patients whose appointments are after yours.  Also, if you no show three or more times for appointments you may be dismissed from the clinic at the providers discretion.     Again, thank you for choosing [facility].  Our hope is that these requests will decrease the amount of time that you wait before being seen by our physicians.       _____________________________________________________________  Should you have questions after your visit to [facility], please contact our office at [phone] and follow the prompts.  Our office hours are 8:00 a.m. and 4:30 p.m. Monday - Friday.  Please note that voicemails left after 4:00 p.m. may not be returned until the following  business day.  We are closed weekends and major holidays.  You do have access to a nurse 24-7, just call the main number to the clinic [phone] and do not press any options, hold on the line and a nurse will answer the phone.    For prescription refill requests, have your pharmacy contact our office and allow 72 hours.    Due to Covid, you will need to wear a mask upon entering the hospital. If you do not have a mask, a mask will be given to you at the Main Entrance upon arrival. For doctor visits, patients may have 1 support person age 14 or older with them. For treatment visits, patients can not have anyone with them due to social distancing guidelines and our immunocompromised population.

## 2021-09-01 ENCOUNTER — Encounter (INDEPENDENT_AMBULATORY_CARE_PROVIDER_SITE_OTHER): Payer: Medicare Other | Admitting: Ophthalmology

## 2021-09-01 DIAGNOSIS — E1165 Type 2 diabetes mellitus with hyperglycemia: Secondary | ICD-10-CM | POA: Diagnosis not present

## 2021-09-01 DIAGNOSIS — I129 Hypertensive chronic kidney disease with stage 1 through stage 4 chronic kidney disease, or unspecified chronic kidney disease: Secondary | ICD-10-CM | POA: Diagnosis not present

## 2021-09-01 DIAGNOSIS — E1140 Type 2 diabetes mellitus with diabetic neuropathy, unspecified: Secondary | ICD-10-CM | POA: Diagnosis not present

## 2021-09-01 DIAGNOSIS — E039 Hypothyroidism, unspecified: Secondary | ICD-10-CM | POA: Diagnosis not present

## 2021-09-03 ENCOUNTER — Encounter (HOSPITAL_COMMUNITY): Payer: Self-pay | Admitting: Hematology

## 2021-09-03 ENCOUNTER — Encounter (INDEPENDENT_AMBULATORY_CARE_PROVIDER_SITE_OTHER): Payer: Medicare Other | Admitting: Ophthalmology

## 2021-09-18 ENCOUNTER — Encounter (HOSPITAL_COMMUNITY): Payer: Self-pay | Admitting: Gastroenterology

## 2021-09-18 NOTE — Progress Notes (Signed)
Attempted to obtain medical history via telephone, unable to reach at this time. HIPAA compliant voicemail message left requesting return call to pre surgical testing department. 

## 2021-09-22 ENCOUNTER — Ambulatory Visit (HOSPITAL_COMMUNITY): Payer: Medicare Other

## 2021-09-26 ENCOUNTER — Other Ambulatory Visit: Payer: Self-pay

## 2021-09-26 ENCOUNTER — Ambulatory Visit (HOSPITAL_COMMUNITY): Payer: Medicare Other | Admitting: Anesthesiology

## 2021-09-26 ENCOUNTER — Encounter (HOSPITAL_COMMUNITY)
Admission: RE | Disposition: A | Discharge status: Home / Self Care | Payer: Self-pay | Source: Home / Self Care | Attending: Gastroenterology

## 2021-09-26 ENCOUNTER — Ambulatory Visit (HOSPITAL_BASED_OUTPATIENT_CLINIC_OR_DEPARTMENT_OTHER): Payer: Medicare Other | Admitting: Anesthesiology

## 2021-09-26 ENCOUNTER — Encounter (HOSPITAL_COMMUNITY): Payer: Self-pay | Admitting: Gastroenterology

## 2021-09-26 ENCOUNTER — Ambulatory Visit (HOSPITAL_COMMUNITY)
Admission: RE | Admit: 2021-09-26 | Discharge: 2021-09-26 | Disposition: A | Discharge status: Home / Self Care | Payer: Medicare Other | Attending: Gastroenterology | Admitting: Gastroenterology

## 2021-09-26 DIAGNOSIS — K7581 Nonalcoholic steatohepatitis (NASH): Secondary | ICD-10-CM | POA: Insufficient documentation

## 2021-09-26 DIAGNOSIS — I8510 Secondary esophageal varices without bleeding: Secondary | ICD-10-CM

## 2021-09-26 DIAGNOSIS — Z800 Family history of malignant neoplasm of digestive organs: Secondary | ICD-10-CM | POA: Diagnosis not present

## 2021-09-26 DIAGNOSIS — E1142 Type 2 diabetes mellitus with diabetic polyneuropathy: Secondary | ICD-10-CM | POA: Diagnosis not present

## 2021-09-26 DIAGNOSIS — M1990 Unspecified osteoarthritis, unspecified site: Secondary | ICD-10-CM | POA: Insufficient documentation

## 2021-09-26 DIAGNOSIS — K766 Portal hypertension: Secondary | ICD-10-CM | POA: Insufficient documentation

## 2021-09-26 DIAGNOSIS — K3189 Other diseases of stomach and duodenum: Secondary | ICD-10-CM | POA: Insufficient documentation

## 2021-09-26 DIAGNOSIS — I129 Hypertensive chronic kidney disease with stage 1 through stage 4 chronic kidney disease, or unspecified chronic kidney disease: Secondary | ICD-10-CM | POA: Insufficient documentation

## 2021-09-26 DIAGNOSIS — Z86010 Personal history of colonic polyps: Secondary | ICD-10-CM | POA: Insufficient documentation

## 2021-09-26 DIAGNOSIS — N183 Chronic kidney disease, stage 3 unspecified: Secondary | ICD-10-CM

## 2021-09-26 DIAGNOSIS — K7460 Unspecified cirrhosis of liver: Secondary | ICD-10-CM | POA: Insufficient documentation

## 2021-09-26 DIAGNOSIS — E039 Hypothyroidism, unspecified: Secondary | ICD-10-CM | POA: Diagnosis not present

## 2021-09-26 DIAGNOSIS — I8500 Esophageal varices without bleeding: Secondary | ICD-10-CM | POA: Diagnosis not present

## 2021-09-26 DIAGNOSIS — E1122 Type 2 diabetes mellitus with diabetic chronic kidney disease: Secondary | ICD-10-CM | POA: Insufficient documentation

## 2021-09-26 HISTORY — PX: ESOPHAGEAL BANDING: SHX5518

## 2021-09-26 HISTORY — PX: ESOPHAGOGASTRODUODENOSCOPY (EGD) WITH PROPOFOL: SHX5813

## 2021-09-26 LAB — GLUCOSE, CAPILLARY: Glucose-Capillary: 128 mg/dL — ABNORMAL HIGH (ref 70–99)

## 2021-09-26 SURGERY — ESOPHAGOGASTRODUODENOSCOPY (EGD) WITH PROPOFOL
Anesthesia: Monitor Anesthesia Care

## 2021-09-26 MED ORDER — SODIUM CHLORIDE 0.9 % IV SOLN
INTRAVENOUS | Status: DC
Start: 2021-09-26 — End: 2021-09-26

## 2021-09-26 MED ORDER — LACTATED RINGERS IV SOLN: INTRAVENOUS | Status: DC

## 2021-09-26 MED ORDER — FENTANYL CITRATE (PF) 100 MCG/2ML IJ SOLN
INTRAMUSCULAR | Status: AC
  Filled 2021-09-26: qty 2

## 2021-09-26 MED ORDER — LIDOCAINE 2% (20 MG/ML) 5 ML SYRINGE
INTRAMUSCULAR | Status: DC | PRN
  Administered 2021-09-26: 60 mg via INTRAVENOUS

## 2021-09-26 MED ORDER — FENTANYL CITRATE (PF) 100 MCG/2ML IJ SOLN
25.0000 ug | Freq: Once | INTRAMUSCULAR | Status: AC
  Administered 2021-09-26: 25 ug via INTRAVENOUS

## 2021-09-26 MED ORDER — PROPOFOL 500 MG/50ML IV EMUL
INTRAVENOUS | Status: DC | PRN
  Administered 2021-09-26: 125 ug/kg/min via INTRAVENOUS

## 2021-09-26 SURGICAL SUPPLY — 15 items
BLOCK BITE 60FR ADLT L/F BLUE (MISCELLANEOUS) ×2 IMPLANT
ELECT REM PT RETURN 9FT ADLT (ELECTROSURGICAL)
ELECTRODE REM PT RTRN 9FT ADLT (ELECTROSURGICAL) IMPLANT
FORCEP RJ3 GP 1.8X160 W-NEEDLE (CUTTING FORCEPS) IMPLANT
FORCEPS BIOP RAD 4 LRG CAP 4 (CUTTING FORCEPS) IMPLANT
NDL SCLEROTHERAPY 25GX240 (NEEDLE) IMPLANT
NEEDLE SCLEROTHERAPY 25GX240 (NEEDLE) IMPLANT
PROBE APC STR FIRE (PROBE) IMPLANT
PROBE INJECTION GOLD (MISCELLANEOUS)
PROBE INJECTION GOLD 7FR (MISCELLANEOUS) IMPLANT
SNARE SHORT THROW 13M SML OVAL (MISCELLANEOUS) IMPLANT
SYR 50ML LL SCALE MARK (SYRINGE) IMPLANT
TUBING ENDO SMARTCAP PENTAX (MISCELLANEOUS) ×6 IMPLANT
TUBING IRRIGATION ENDOGATOR (MISCELLANEOUS) ×2 IMPLANT
WATER STERILE IRR 1000ML POUR (IV SOLUTION) IMPLANT

## 2021-09-26 NOTE — Anesthesia Preprocedure Evaluation (Addendum)
Anesthesia Evaluation  Patient identified by MRN, date of birth, ID band Patient awake    Reviewed: Allergy & Precautions, NPO status , Patient's Chart, lab work & pertinent test results  Airway Mallampati: II       Dental no notable dental hx. (+) Edentulous Upper,    Pulmonary    Pulmonary exam normal        Cardiovascular hypertension, Pt. on medications and Pt. on home beta blockers Normal cardiovascular exam     Neuro/Psych  Headaches, PSYCHIATRIC DISORDERS Depression Carotid duplex 1/22 Early bilateral carotid bifurcation plaque resulting in less  than 50% diameter stenosis. The exam does not exclude plaque  ulceration or embolization. Continued surveillance recommended.  Neuromuscular disease    GI/Hepatic GERD  Medicated,(+) Cirrhosis       , Hepatitis -, Unspecified  Endo/Other  diabetes, Type 2, Insulin DependentHypothyroidism   Renal/GU Renal diseaseCKD III     Musculoskeletal  (+) Arthritis ,   Abdominal Normal abdominal exam  (+)   Peds  Hematology  (+) Blood dyscrasia, anemia ,   Anesthesia Other Findings   Reproductive/Obstetrics                            Anesthesia Physical  Anesthesia Plan  ASA: 3  Anesthesia Plan: MAC   Post-op Pain Management: Minimal or no pain anticipated   Induction: Intravenous  PONV Risk Score and Plan: 2 and Treatment may vary due to age or medical condition  Airway Management Planned: Natural Airway and Mask  Additional Equipment: None  Intra-op Plan:   Post-operative Plan:   Informed Consent: I have reviewed the patients History and Physical, chart, labs and discussed the procedure including the risks, benefits and alternatives for the proposed anesthesia with the patient or authorized representative who has indicated his/her understanding and acceptance.     Dental advisory given  Plan Discussed with: CRNA and  Anesthesiologist  Anesthesia Plan Comments:         Anesthesia Quick Evaluation

## 2021-09-26 NOTE — H&P (Signed)
[name] HPI: This 71 year old white female presents to the office for a 6 month follow up. She NASH cirrhosis. She has 1 BM every 2 days with no obvious blood or mucus in the stool. She has pelvic pain and pressure which she suspects is from her pessary. She has a has good appetite and has gained 13 pounds over the last 6 months. She denies having any complaints of nausea, vomiting, acid reflux, dysphagia or odynophagia. She denies having a family history of celiac sprue. Her maternal aunt has Crohn's disease. Her paternal grandfather died of colon cancer but the age of diagnosis is not known. Her last colonoscopy was done on 01/18/2017 when a tubular adenoma was removed from the ascending colon.  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  She is due for repeat 1 year follow-up EGD.  Past Medical History:  Diagnosis Date   Alkaline phosphatase elevation    Anemia    ASD (atrial septal defect)    Cataract    both eyes hx of   Chest pain    03-17-2013 last chest pain   Chronic kidney disease    Stage III kidney disease   Cirrhosis ([place])    Depression    Diabetes mellitus type II    Fatigue    GERD (gastroesophageal reflux disease)    Gout    Headache(784.0)    occasional   Hyperlipidemia    Hypothyroidism    Neuropathy    Peripheral neuropathy    Presence of pessary    Retinopathy     Past Surgical History:  Procedure Laterality Date   APPENDECTOMY     CATARACT EXTRACTION     CERVICAL SPINE SURGERY  2006   CHOLECYSTECTOMY N/A 04/07/2013   Procedure: LAPAROSCOPIC CHOLECYSTECTOMY WITH INTRAOPERATIVE CHOLANGIOGRAM;  Surgeon: Merrie Roof, MD;  Location: [facility];  Service: General;  Laterality: N/A;   COMBINED HYSTERECTOMY VAGINAL / OOPHORECTOMY / A&P REPAIR  1987   Unilateral oophorectomy, h/o uterine prolapse has right ovary   ERCP N/A 04/06/2013   Procedure: ENDOSCOPIC  RETROGRADE CHOLANGIOPANCREATOGRAPHY (ERCP);  Surgeon: Beryle Beams, MD;  Location: [facility];  Service: Endoscopy;  Laterality: N/A;   ESOPHAGOGASTRODUODENOSCOPY (EGD) WITH PROPOFOL N/A 08/16/2020   Procedure: ESOPHAGOGASTRODUODENOSCOPY (EGD) WITH PROPOFOL;  Surgeon: Carol Ada, MD;  Location: [facility];  Service: Endoscopy;  Laterality: N/A;   EUS N/A 03/31/2013   Procedure: UPPER ENDOSCOPIC ULTRASOUND (EUS) LINEAR;  Surgeon: Beryle Beams, MD;  Location: [facility];  Service: Endoscopy;  Laterality: N/A;   REFRACTIVE SURGERY     SPINAL FUSION     TONSILLECTOMY  age 9    Family History  Problem Relation Age of Onset   COPD Mother    Lung cancer Father    Diabetes Sister    Cataracts Sister    Insulin resistance Daughter    Insulin resistance Son     Social History:  reports that she has never smoked. She has never used smokeless tobacco. She reports that she does not drink alcohol and does not use drugs.  Allergies:  Allergies  Allergen Reactions   Atorvastatin     Other reaction(s): arthralgia/myalgia   Ciprofloxacin     Other reaction(s): severe yeast infection   Crestor [Rosuvastatin]     Other reaction(s): arthralgia/myalgia   Ezetimibe     Other reaction(s): Unknown   Lisinopril  Other reaction(s): cough   Losartan Potassium     Other reaction(s): cough   Lovastatin     Other reaction(s): arthralgia/myalgia   Statins Other (See Comments)    REACTION: diarrhea/joint pain/muscle aches. Tried at low doses.  Other reaction(s): arthralgia/myalgia   Welchol [Colesevelam]     Other reaction(s): arthralgia/myalgia   Dilaudid [Hydromorphone Hcl] Nausea And Vomiting    After one vomiting episode no further vomiting    Medications: Scheduled: Continuous:  No results found for this or any previous visit (from the past 24 hour(s)).   No results found.  ROS:  As stated above in the HPI otherwise negative.  There were no vitals taken for this visit.     PE: Gen: NAD, Alert and Oriented HEENT:  Scalp Level/AT, EOMI Neck: Supple, no LAD Lungs: CTA Bilaterally CV: RRR without M/G/R ABD: Soft, NTND, +BS Ext: No C/C/E  Assessment/Plan: 1) NASH cirrhosis. 2) Esophageal varices - EGD.  [name] D 09/26/2021, 7:21 AM

## 2021-09-26 NOTE — Anesthesia Procedure Notes (Signed)
Procedure Name: MAC Date/Time: 09/26/2021 8:44 AM  Performed by: Genelle Bal, CRNAPre-anesthesia Checklist: Patient identified, Emergency Drugs available, Suction available, Timeout performed and Patient being monitored Patient Re-evaluated:Patient Re-evaluated prior to induction Oxygen Delivery Method: Simple face mask Preoxygenation: Pre-oxygenation with 100% oxygen Induction Type: IV induction

## 2021-09-26 NOTE — Discharge Instructions (Signed)

## 2021-09-26 NOTE — Transfer of Care (Signed)
Immediate Anesthesia Transfer of Care Note  Patient: [name]  Procedure(s) Performed: ESOPHAGOGASTRODUODENOSCOPY (EGD) WITH PROPOFOL ESOPHAGEAL BANDING  Patient Location: Endoscopy Unit  Anesthesia Type:MAC  Level of Consciousness: awake, alert  and oriented  Airway & Oxygen Therapy: Patient Spontanous Breathing and Patient connected to face mask oxygen  Post-op Assessment: Report given to RN and Post -op Vital signs reviewed and stable  Post vital signs: Reviewed and stable  Last Vitals:  Vitals Value Taken Time  BP 124/98   Temp    Pulse 61 09/26/21 0856  Resp 22 09/26/21 0856  SpO2 100 % 09/26/21 0856  Vitals shown include unvalidated device data.  Last Pain:  Vitals:   09/26/21 0854  TempSrc: Temporal  PainSc: Asleep         Complications: No notable events documented.

## 2021-09-26 NOTE — Op Note (Signed)
[facility] Patient Name: [name] Procedure Date: 09/26/2021 MRN: [id number] Attending MD: Carol Ada , MD Date of Birth: February 20, 1951 CSN: [id number] Age: 71 Admit Type: Outpatient Procedure:                Upper GI endoscopy Indications:              Surveillance procedure Providers:                Carol Ada, MD, Jaci Carrel, RN, William Dalton, Technician, Darliss Cheney, Technician Referring MD:              Medicines:                Propofol per Anesthesia Complications:            No immediate complications. Estimated Blood Loss:     Estimated blood loss: none. Procedure:                Pre-Anesthesia Assessment:                           - Prior to the procedure, a History and Physical                            was performed, and patient medications and                            allergies were reviewed. The patient's tolerance of                            previous anesthesia was also reviewed. The risks                            and benefits of the procedure and the sedation                            options and risks were discussed with the patient.                            All questions were answered, and informed consent                            was obtained. Prior Anticoagulants: The patient has                            taken no previous anticoagulant or antiplatelet                            agents. ASA Grade Assessment: III - A patient with                            severe systemic disease. After reviewing the risks  and benefits, the patient was deemed in                            satisfactory condition to undergo the procedure.                           - Sedation was administered by an anesthesia                            professional. Deep sedation was attained.                           After obtaining informed consent, the endoscope was                            passed  under direct vision. Throughout the                            procedure, the patient's blood pressure, pulse, and                            oxygen saturations were monitored continuously. The                            GIF-H190 ([id number]) Olympus endoscope was introduced                            through the mouth, and advanced to the second part                            of duodenum. The upper GI endoscopy was                            accomplished without difficulty. The patient                            tolerated the procedure well. Scope In: Scope Out: Findings:      Large (> 5 mm) varices were found in the middle third of the esophagus       and in the lower third of the esophagus. Four bands were successfully       placed with incomplete eradication of varices. There was no bleeding       during the procedure.      Mild portal hypertensive gastropathy was found in the entire examined       stomach.      The examined duodenum was normal.      There was definite progression of her varices. Four bands were       successfully placed. The lumen was too crowded to place any more bands. 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 Incompletely                            eradicated. Banded.                           -  Portal hypertensive gastropathy.                           - Normal examined duodenum.                           - No specimens collected. Moderate Sedation:      Not Applicable - Patient had care per Anesthesia. Recommendation:           - Patient has a contact number available for                            emergencies. The signs and symptoms of potential                            delayed complications were discussed with the                            patient. Return to normal activities tomorrow.                            Written discharge instructions were provided to the                            patient.                           - Resume  previous diet.                           - Continue present medications.                           - Repeat upper endoscopy in 4 years for retreatment. Procedure Code(s):        --- Professional ---                           [phone], Esophagogastroduodenoscopy, flexible,                            transoral; with band ligation of esophageal/gastric                            varices Diagnosis Code(s):        --- Professional ---                           I85.00, Esophageal varices without bleeding                           K76.6, Portal hypertension                           K31.89, Other diseases of stomach and duodenum CPT copyright 2019 [employer]. All rights reserved. The codes documented in this report are preliminary and upon coder review may  be revised to meet current compliance requirements. Carol Ada, MD Carol Ada, MD 09/26/2021 8:54:38  AM This report has been signed electronically. Number of Addenda: 0

## 2021-09-29 ENCOUNTER — Ambulatory Visit (HOSPITAL_COMMUNITY)
Admission: RE | Admit: 2021-09-29 | Discharge: 2021-09-29 | Disposition: A | Discharge status: Home / Self Care | Payer: Medicare Other | Source: Ambulatory Visit | Attending: Internal Medicine | Admitting: Internal Medicine

## 2021-09-29 DIAGNOSIS — Z1231 Encounter for screening mammogram for malignant neoplasm of breast: Secondary | ICD-10-CM | POA: Diagnosis not present

## 2021-09-29 NOTE — Anesthesia Postprocedure Evaluation (Signed)
Anesthesia Post Note  Patient: LAKEITHIA RASOR  Procedure(s) Performed: ESOPHAGOGASTRODUODENOSCOPY (EGD) WITH PROPOFOL ESOPHAGEAL BANDING     Patient location during evaluation: PACU Anesthesia Type: MAC Level of consciousness: awake and alert Pain management: pain level controlled Vital Signs Assessment: post-procedure vital signs reviewed and stable Respiratory status: spontaneous breathing, nonlabored ventilation, respiratory function stable and patient connected to nasal cannula oxygen Cardiovascular status: stable and blood pressure returned to baseline Postop Assessment: no apparent nausea or vomiting Anesthetic complications: no   No notable events documented.  Last Vitals:  Vitals:   09/26/21 0930 09/26/21 0940  BP: (!) 149/51 (!) 142/54  Pulse: (!) 54 (!) 54  Resp: 20 14  Temp:    SpO2: 99% 99%    Last Pain:  Vitals:   09/26/21 0940  TempSrc:   PainSc: 3    Pain Goal:                   Bearl Talarico

## 2021-10-02 ENCOUNTER — Encounter (INDEPENDENT_AMBULATORY_CARE_PROVIDER_SITE_OTHER): Payer: Medicare Other | Admitting: Ophthalmology

## 2021-10-08 ENCOUNTER — Other Ambulatory Visit: Payer: Self-pay | Admitting: Gastroenterology

## 2021-10-10 DIAGNOSIS — R42 Dizziness and giddiness: Secondary | ICD-10-CM | POA: Diagnosis not present

## 2021-10-13 DIAGNOSIS — R2689 Other abnormalities of gait and mobility: Secondary | ICD-10-CM | POA: Diagnosis not present

## 2021-10-13 DIAGNOSIS — R2681 Unsteadiness on feet: Secondary | ICD-10-CM | POA: Diagnosis not present

## 2021-10-24 ENCOUNTER — Encounter (HOSPITAL_COMMUNITY): Payer: Self-pay | Admitting: Gastroenterology

## 2021-10-24 NOTE — Progress Notes (Signed)
Attempted to obtain medical history via telephone, unable to reach at this time. HIPAA compliant voicemail message left requesting return call to pre surgical testing department. 

## 2021-10-27 DIAGNOSIS — E11319 Type 2 diabetes mellitus with unspecified diabetic retinopathy without macular edema: Secondary | ICD-10-CM | POA: Diagnosis not present

## 2021-10-27 DIAGNOSIS — I129 Hypertensive chronic kidney disease with stage 1 through stage 4 chronic kidney disease, or unspecified chronic kidney disease: Secondary | ICD-10-CM | POA: Diagnosis not present

## 2021-10-27 DIAGNOSIS — N189 Chronic kidney disease, unspecified: Secondary | ICD-10-CM | POA: Diagnosis not present

## 2021-10-27 DIAGNOSIS — E782 Mixed hyperlipidemia: Secondary | ICD-10-CM | POA: Diagnosis not present

## 2021-10-30 NOTE — Anesthesia Preprocedure Evaluation (Addendum)
Anesthesia Evaluation  Patient identified by MRN, date of birth, ID band Patient awake    Reviewed: Allergy & Precautions, NPO status , Patient's Chart, lab work & pertinent test results, reviewed documented beta blocker date and time   Airway Mallampati: IV  TM Distance: >3 FB Neck ROM: Full   Comment: Small chin, recessed mandible but appears to have been an easy intubation in 2015 for lap [facility]  (+) Edentulous Upper, Dental Advisory Given   Pulmonary neg pulmonary ROS,    Pulmonary exam normal breath sounds clear to auscultation       Cardiovascular hypertension (144/45 in preop, typically runs in 120s-140s), Pt. on home beta blockers and Pt. on medications Normal cardiovascular exam Rhythm:Regular Rate:Normal     Neuro/Psych  Headaches, PSYCHIATRIC DISORDERS Depression    GI/Hepatic GERD  Controlled,(+) Cirrhosis   Esophageal Varices    , Last banding 08/2021    Endo/Other  diabetes, Well Controlled, Type 2, Insulin DependentHypothyroidism a1c 6 Took 3 units of insulin for FS in 180s this AM  Renal/GU CRFRenal disease  negative genitourinary   Musculoskeletal  (+) Arthritis , Osteoarthritis,    Abdominal   Peds  Hematology  (+) Blood dyscrasia, anemia ,   Anesthesia Other Findings   Reproductive/Obstetrics negative OB ROS                            Anesthesia Physical Anesthesia Plan  ASA: 3  Anesthesia Plan: MAC   Post-op Pain Management:    Induction:   PONV Risk Score and Plan: 2 and Propofol infusion and TIVA  Airway Management Planned: Natural Airway and Simple Face Mask  Additional Equipment: None  Intra-op Plan:   Post-operative Plan:   Informed Consent: I have reviewed the patients History and Physical, chart, labs and discussed the procedure including the risks, benefits and alternatives for the proposed anesthesia with the patient or authorized  representative who has indicated his/her understanding and acceptance.     Dental advisory given  Plan Discussed with: CRNA  Anesthesia Plan Comments:        Anesthesia Quick Evaluation

## 2021-10-31 ENCOUNTER — Encounter (HOSPITAL_COMMUNITY): Payer: Self-pay | Admitting: Gastroenterology

## 2021-10-31 ENCOUNTER — Ambulatory Visit (HOSPITAL_COMMUNITY): Payer: Medicare Other | Admitting: Anesthesiology

## 2021-10-31 ENCOUNTER — Encounter (HOSPITAL_COMMUNITY): Payer: Self-pay | Admitting: Hematology

## 2021-10-31 ENCOUNTER — Encounter (HOSPITAL_COMMUNITY)
Admission: RE | Disposition: A | Discharge status: Home / Self Care | Payer: Self-pay | Source: Ambulatory Visit | Attending: Gastroenterology

## 2021-10-31 ENCOUNTER — Ambulatory Visit (HOSPITAL_BASED_OUTPATIENT_CLINIC_OR_DEPARTMENT_OTHER): Payer: Medicare Other | Admitting: Anesthesiology

## 2021-10-31 ENCOUNTER — Other Ambulatory Visit: Payer: Self-pay

## 2021-10-31 ENCOUNTER — Ambulatory Visit (HOSPITAL_COMMUNITY)
Admission: RE | Admit: 2021-10-31 | Discharge: 2021-10-31 | Disposition: A | Discharge status: Home / Self Care | Payer: Medicare Other | Source: Ambulatory Visit | Attending: Gastroenterology | Admitting: Gastroenterology

## 2021-10-31 DIAGNOSIS — K3189 Other diseases of stomach and duodenum: Secondary | ICD-10-CM | POA: Insufficient documentation

## 2021-10-31 DIAGNOSIS — E039 Hypothyroidism, unspecified: Secondary | ICD-10-CM | POA: Insufficient documentation

## 2021-10-31 DIAGNOSIS — Z794 Long term (current) use of insulin: Secondary | ICD-10-CM | POA: Diagnosis not present

## 2021-10-31 DIAGNOSIS — I129 Hypertensive chronic kidney disease with stage 1 through stage 4 chronic kidney disease, or unspecified chronic kidney disease: Secondary | ICD-10-CM | POA: Diagnosis not present

## 2021-10-31 DIAGNOSIS — E1122 Type 2 diabetes mellitus with diabetic chronic kidney disease: Secondary | ICD-10-CM | POA: Insufficient documentation

## 2021-10-31 DIAGNOSIS — K7460 Unspecified cirrhosis of liver: Secondary | ICD-10-CM | POA: Insufficient documentation

## 2021-10-31 DIAGNOSIS — K219 Gastro-esophageal reflux disease without esophagitis: Secondary | ICD-10-CM | POA: Insufficient documentation

## 2021-10-31 DIAGNOSIS — I8500 Esophageal varices without bleeding: Secondary | ICD-10-CM | POA: Diagnosis not present

## 2021-10-31 DIAGNOSIS — K766 Portal hypertension: Secondary | ICD-10-CM | POA: Diagnosis not present

## 2021-10-31 DIAGNOSIS — N183 Chronic kidney disease, stage 3 unspecified: Secondary | ICD-10-CM | POA: Insufficient documentation

## 2021-10-31 DIAGNOSIS — K7581 Nonalcoholic steatohepatitis (NASH): Secondary | ICD-10-CM | POA: Insufficient documentation

## 2021-10-31 DIAGNOSIS — I8510 Secondary esophageal varices without bleeding: Secondary | ICD-10-CM

## 2021-10-31 HISTORY — PX: ESOPHAGOGASTRODUODENOSCOPY (EGD) WITH PROPOFOL: SHX5813

## 2021-10-31 HISTORY — PX: ESOPHAGEAL BANDING: SHX5518

## 2021-10-31 LAB — POCT I-STAT, CHEM 8
BUN: 37 mg/dL — ABNORMAL HIGH (ref 8–23)
Calcium, Ion: 1.18 mmol/L (ref 1.15–1.40)
Chloride: 104 mmol/L (ref 98–111)
Creatinine, Ser: 1.6 mg/dL — ABNORMAL HIGH (ref 0.44–1.00)
Glucose, Bld: 131 mg/dL — ABNORMAL HIGH (ref 70–99)
HCT: 33 % — ABNORMAL LOW (ref 36.0–46.0)
Hemoglobin: 11.2 g/dL — ABNORMAL LOW (ref 12.0–15.0)
Potassium: 3.5 mmol/L (ref 3.5–5.1)
Sodium: 142 mmol/L (ref 135–145)
TCO2: 23 mmol/L (ref 22–32)

## 2021-10-31 SURGERY — ESOPHAGOGASTRODUODENOSCOPY (EGD) WITH PROPOFOL
Anesthesia: Monitor Anesthesia Care

## 2021-10-31 MED ORDER — SODIUM CHLORIDE 0.9 % IV SOLN: INTRAVENOUS | Status: DC

## 2021-10-31 MED ORDER — LACTATED RINGERS IV SOLN: INTRAVENOUS | Status: DC

## 2021-10-31 MED ORDER — LIDOCAINE 2% (20 MG/ML) 5 ML SYRINGE
INTRAMUSCULAR | Status: DC | PRN
  Administered 2021-10-31: 40 mg via INTRAVENOUS

## 2021-10-31 MED ORDER — FENTANYL CITRATE (PF) 100 MCG/2ML IJ SOLN
INTRAMUSCULAR | Status: AC
  Filled 2021-10-31: qty 2

## 2021-10-31 MED ORDER — PHENYLEPHRINE HCL (PRESSORS) 10 MG/ML IV SOLN
INTRAVENOUS | Status: AC
  Filled 2021-10-31: qty 1

## 2021-10-31 MED ORDER — PROPOFOL 10 MG/ML IV BOLUS
INTRAVENOUS | Status: AC
  Filled 2021-10-31: qty 20

## 2021-10-31 MED ORDER — PROPOFOL 10 MG/ML IV BOLUS
INTRAVENOUS | Status: DC | PRN
  Administered 2021-10-31: 40 mg via INTRAVENOUS

## 2021-10-31 MED ORDER — ONDANSETRON HCL 4 MG/2ML IJ SOLN: 4.0000 mg | Freq: Once | INTRAMUSCULAR | Status: DC | PRN

## 2021-10-31 MED ORDER — FENTANYL CITRATE (PF) 100 MCG/2ML IJ SOLN
50.0000 ug | Freq: Once | INTRAMUSCULAR | Status: AC
  Administered 2021-10-31: 50 ug via INTRAVENOUS

## 2021-10-31 MED ORDER — PROPOFOL 500 MG/50ML IV EMUL
INTRAVENOUS | Status: DC | PRN
  Administered 2021-10-31: 125 ug/kg/min via INTRAVENOUS

## 2021-10-31 MED ORDER — FENTANYL CITRATE (PF) 100 MCG/2ML IJ SOLN
25.0000 ug | Freq: Once | INTRAMUSCULAR | Status: AC
  Administered 2021-10-31: 25 ug via INTRAVENOUS

## 2021-10-31 MED ORDER — FENTANYL CITRATE (PF) 100 MCG/2ML IJ SOLN: 25.0000 ug | INTRAMUSCULAR | Status: DC | PRN

## 2021-10-31 SURGICAL SUPPLY — 15 items

## 2021-10-31 NOTE — H&P (Signed)
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  During the 09/26/2021 EGD four bands were placed.  Past Medical History:  Diagnosis Date   Alkaline phosphatase elevation    Anemia    ASD (atrial septal defect)    Cataract    both eyes hx of   Chest pain    03-17-2013 last chest pain   Chronic kidney disease    Stage III kidney disease   Cirrhosis ([place])    Depression    Diabetes mellitus type II    Fatigue    GERD (gastroesophageal reflux disease)    Gout    Headache(784.0)    occasional   Hyperlipidemia    Hypothyroidism    Neuropathy    Peripheral neuropathy    Presence of pessary    Retinopathy     Past Surgical History:  Procedure Laterality Date   APPENDECTOMY     CATARACT EXTRACTION     CERVICAL SPINE SURGERY  2006   CHOLECYSTECTOMY N/A 04/07/2013   Procedure: LAPAROSCOPIC CHOLECYSTECTOMY WITH INTRAOPERATIVE CHOLANGIOGRAM;  Surgeon: Merrie Roof, MD;  Location: [facility];  Service: General;  Laterality: N/A;   COMBINED HYSTERECTOMY VAGINAL / OOPHORECTOMY / A&P REPAIR  1987   Unilateral oophorectomy, h/o uterine prolapse has right ovary   ERCP N/A 04/06/2013   Procedure: ENDOSCOPIC RETROGRADE CHOLANGIOPANCREATOGRAPHY (ERCP);  Surgeon: Beryle Beams, MD;  Location: [facility];  Service: Endoscopy;  Laterality: N/A;   ESOPHAGEAL BANDING N/A 09/26/2021   Procedure: ESOPHAGEAL BANDING;  Surgeon: Carol Ada, MD;  Location: [facility];  Service: Gastroenterology;  Laterality: N/A;   ESOPHAGOGASTRODUODENOSCOPY (EGD) WITH PROPOFOL N/A 08/16/2020   Procedure: ESOPHAGOGASTRODUODENOSCOPY (EGD) WITH PROPOFOL;  Surgeon: Carol Ada, MD;  Location: [facility];  Service: Endoscopy;  Laterality: N/A;   ESOPHAGOGASTRODUODENOSCOPY (EGD) WITH PROPOFOL N/A 09/26/2021   Procedure: ESOPHAGOGASTRODUODENOSCOPY (EGD) WITH PROPOFOL;  Surgeon: Carol Ada, MD;  Location: [facility];  Service: Gastroenterology;  Laterality: N/A;   EUS  N/A 03/31/2013   Procedure: UPPER ENDOSCOPIC ULTRASOUND (EUS) LINEAR;  Surgeon: Beryle Beams, MD;  Location: [facility];  Service: Endoscopy;  Laterality: N/A;   REFRACTIVE SURGERY     SPINAL FUSION     TONSILLECTOMY  age 71    Family History  Problem Relation Age of Onset   COPD Mother    Lung cancer Father    Diabetes Sister    Cataracts Sister    Insulin resistance Daughter    Insulin resistance Son     Social History:  reports that she has never smoked. She has never used smokeless tobacco. She reports that she does not drink alcohol and does not use drugs.  Allergies:  Allergies  Allergen Reactions   Atorvastatin     Other reaction(s): arthralgia/myalgia   Ciprofloxacin     Other reaction(s): severe yeast infection   Crestor [Rosuvastatin]     Other reaction(s): arthralgia/myalgia   Ezetimibe     Other reaction(s): Unknown   Lisinopril     Other reaction(s): cough   Losartan Potassium     Other reaction(s): cough   Lovastatin     Other reaction(s): arthralgia/myalgia   Statins Other (See Comments)    REACTION: diarrhea/joint pain/muscle aches. Tried at low doses.  Other reaction(s): arthralgia/myalgia   Welchol [Colesevelam]     Other reaction(s): arthralgia/myalgia   Dilaudid [Hydromorphone Hcl] Nausea And Vomiting    After one vomiting episode no further vomiting    Medications: Scheduled:  Continuous:  sodium chloride      No results found for this or any previous visit (from the past 24 hour(s)).   No results found.  ROS:  As stated above in the HPI otherwise negative.  There were no vitals taken for this visit.    PE: Gen: NAD, Alert and Oriented HEENT:  [place]/AT, EOMI Neck: Supple, no LAD Lungs: CTA Bilaterally CV: RRR without M/G/R ABD: Soft, NTND, +BS Ext: No C/C/E  Assessment/Plan: 1) Esophageal varices. 2) NASH cirrhosis.  Plan: 1) EGD with banding.  [name] D 10/31/2021, 6:50 AM

## 2021-10-31 NOTE — Anesthesia Postprocedure Evaluation (Signed)
Anesthesia Post Note  Patient: [name]  Procedure(s) Performed: ESOPHAGOGASTRODUODENOSCOPY (EGD) WITH PROPOFOL ESOPHAGEAL BANDING     Patient location during evaluation: PACU Anesthesia Type: MAC Level of consciousness: awake and alert Pain management: pain level controlled Vital Signs Assessment: post-procedure vital signs reviewed and stable Respiratory status: spontaneous breathing, nonlabored ventilation and respiratory function stable Cardiovascular status: blood pressure returned to baseline and stable Postop Assessment: no apparent nausea or vomiting Anesthetic complications: no   No notable events documented.  Last Vitals:  Vitals:   10/31/21 0830 10/31/21 0840  BP: (!) 147/61 (!) 149/54  Pulse: 61 (!) 59  Resp: 15 12  Temp:    SpO2: 97% 100%    Last Pain:  Vitals:   10/31/21 0840  TempSrc:   PainSc: 0-No pain                 Pervis Hocking

## 2021-10-31 NOTE — Progress Notes (Signed)
Pt presented to endoscopy today for EGD with banding. This RN assumed care from Mikey College, Therapist, sports. Received report that pt had received 50 mcg fentanyl IV. Upon reassessment, pt's pain was 5/10. MD Benson Norway was notified and gave a verbal order for an additional 25 mcg fentanyl IV push. Medication given as ordered. Upon reassessment, pt denied any further pain. Pt discharged to home per endoscopy discharge criteria.  Debarah Crape, RN 10/31/21 9:33 AM

## 2021-10-31 NOTE — Op Note (Signed)
[facility] Patient Name: [name] Procedure Date: 10/31/2021 MRN: [id number] Attending MD: Carol Ada , MD Date of Birth: 12-30-1950 CSN: [id number] Age: 71 Admit Type: Outpatient Procedure:                Upper GI endoscopy Indications:              Therapeutic procedure Providers:                Carol Ada, MD, Mikey College, RN, Benetta Spar, Technician Referring MD:              Medicines:                Propofol per Anesthesia Complications:            No immediate complications. Estimated Blood Loss:     Estimated blood loss: none. Procedure:                Pre-Anesthesia Assessment:                           - Prior to the procedure, a History and Physical                            was performed, and patient medications and                            allergies were reviewed. The patient's tolerance of                            previous anesthesia was also reviewed. The risks                            and benefits of the procedure and the sedation                            options and risks were discussed with the patient.                            All questions were answered, and informed consent                            was obtained. Prior Anticoagulants: The patient has                            taken no previous anticoagulant or antiplatelet                            agents. ASA Grade Assessment: III - A patient with                            severe systemic disease. After reviewing the risks                            and  benefits, the patient was deemed in                            satisfactory condition to undergo the procedure.                           - Sedation was administered by an anesthesia                            professional. Deep sedation was attained.                           After obtaining informed consent, the endoscope was                            passed under direct vision. Throughout  the                            procedure, the patient's blood pressure, pulse, and                            oxygen saturations were monitored continuously. The                            GIF-H190 ([id number] Olympus endoscope was introduced                            through the mouth, and advanced to the second part                            of duodenum. The upper GI endoscopy was                            accomplished without difficulty. The patient                            tolerated the procedure well. Scope In: Scope Out: Findings:      Large (> 5 mm) varices were found in the middle third of the esophagus       and in the lower third of the esophagus. Five bands were successfully       placed with incomplete eradication of varices. There was no bleeding       during the procedure.      Mild portal hypertensive gastropathy was found in the entire examined       stomach.      The examined duodenum was normal.      The varices near the GE junction responded very well to the prior       banding. Most of the banding was concentrated in the proximal portion of       the lower esophagus and the distal portion of the mid esophagus. With       the bander it was difficult to pass the endoscope through the distal       esophagus. Impression:               - Large (> 5  mm) esophageal varices. Incompletely                            eradicated. Banded.                           - Portal hypertensive gastropathy.                           - Normal examined duodenum.                           - No specimens collected. Moderate Sedation:      Not Applicable - Patient had care per Anesthesia. Recommendation:           - Patient has a contact number available for                            emergencies. The signs and symptoms of potential                            delayed complications were discussed with the                            patient. Return to normal activities tomorrow.                             Written discharge instructions were provided to the                            patient.                           - Resume previous diet.                           - Continue present medications.                           - Repeat upper endoscopy in 4 weeks for retreatment. Procedure Code(s):        --- Professional ---                           [phone], Esophagogastroduodenoscopy, flexible,                            transoral; with band ligation of esophageal/gastric                            varices Diagnosis Code(s):        --- Professional ---                           I85.00, Esophageal varices without bleeding                           K76.6, Portal hypertension  K31.89, Other diseases of stomach and duodenum CPT copyright 2019 American Medical Association. All rights reserved. The codes documented in this report are preliminary and upon coder review may  be revised to meet current compliance requirements. Carol Ada, MD Carol Ada, MD 10/31/2021 7:48:35 AM This report has been signed electronically. Number of Addenda: 0

## 2021-10-31 NOTE — Transfer of Care (Signed)
Immediate Anesthesia Transfer of Care Note  Patient: [name]  Procedure(s) Performed: ESOPHAGOGASTRODUODENOSCOPY (EGD) WITH PROPOFOL ESOPHAGEAL BANDING  Patient Location: PACU and Endoscopy Unit  Anesthesia Type:MAC  Level of Consciousness: awake and alert   Airway & Oxygen Therapy: Patient Spontanous Breathing and Patient connected to face mask oxygen  Post-op Assessment: Report given to RN and Post -op Vital signs reviewed and stable  Post vital signs: Reviewed and stable  Last Vitals:  Vitals Value Taken Time  BP    Temp    Pulse    Resp    SpO2      Last Pain:  Vitals:   10/31/21 0707  TempSrc: Temporal  PainSc: 0-No pain         Complications: No notable events documented.

## 2021-10-31 NOTE — Discharge Instructions (Signed)
YOU HAD AN ENDOSCOPIC PROCEDURE TODAY: Refer to the procedure report and other information in the discharge instructions given to you for any specific questions about what was found during the examination. If this information does not answer your questions, please call [place] at [phone] to clarify.   YOU SHOULD EXPECT: Some feelings of bloating in the abdomen. Passage of more gas than usual. Walking can help get rid of the air that was put into your GI tract during the procedure and reduce the bloating.  DIET: Your first meal following the procedure should be a light meal and then it is ok to progress to your normal diet. A half-sandwich or bowl of soup is an example of a good first meal. Heavy or fried foods are harder to digest and may make you feel nauseous or bloated. Drink plenty of fluids but you should avoid alcoholic beverages for 24 hours.  ACTIVITY: Your care partner should take you home directly after the procedure. You should plan to take it easy, moving slowly for the rest of the day. You can resume normal activity the day after the procedure however YOU SHOULD NOT DRIVE, use power tools, machinery or perform tasks that involve climbing or major physical exertion for 24 hours (because of the sedation medicines used during the test).   SYMPTOMS TO REPORT IMMEDIATELY: A gastroenterologist can be reached at any hour. Please call [phone]  for any of the following symptoms:   Following upper endoscopy (EGD, EUS, ERCP, esophageal dilation) Vomiting of blood or coffee ground material  New, significant abdominal pain  New, significant chest pain or pain under the shoulder blades  Painful or persistently difficult swallowing  New shortness of breath  Black, tarry-looking or red, bloody stools  FOLLOW UP:  If any biopsies were taken you will be contacted by phone or by letter within the next 1-3 weeks. Call [phone]  if you have not heard about the biopsies in 3  weeks.  Please also call with any specific questions about appointments or follow up tests.

## 2021-10-31 NOTE — Anesthesia Procedure Notes (Signed)
Date/Time: 10/31/2021 7:24 AM  Performed by: Cynda Familia, CRNAPre-anesthesia Checklist: Patient identified, Emergency Drugs available, Suction available, Patient being monitored and Timeout performed Patient Re-evaluated:Patient Re-evaluated prior to induction Oxygen Delivery Method: Simple face mask Placement Confirmation: breath sounds checked- equal and bilateral and positive ETCO2 Dental Injury: Teeth and Oropharynx as per pre-operative assessment  Comments: Bite block by RN

## 2021-11-03 ENCOUNTER — Encounter (HOSPITAL_COMMUNITY): Payer: Self-pay | Admitting: Gastroenterology

## 2021-11-12 ENCOUNTER — Encounter: Payer: Self-pay | Admitting: Obstetrics & Gynecology

## 2021-11-27 DIAGNOSIS — N1831 Chronic kidney disease, stage 3a: Secondary | ICD-10-CM | POA: Diagnosis not present

## 2021-12-02 DIAGNOSIS — E782 Mixed hyperlipidemia: Secondary | ICD-10-CM | POA: Diagnosis not present

## 2021-12-02 DIAGNOSIS — M109 Gout, unspecified: Secondary | ICD-10-CM | POA: Diagnosis not present

## 2021-12-02 DIAGNOSIS — N189 Chronic kidney disease, unspecified: Secondary | ICD-10-CM | POA: Diagnosis not present

## 2021-12-04 DIAGNOSIS — I129 Hypertensive chronic kidney disease with stage 1 through stage 4 chronic kidney disease, or unspecified chronic kidney disease: Secondary | ICD-10-CM | POA: Diagnosis not present

## 2021-12-04 DIAGNOSIS — M898X9 Other specified disorders of bone, unspecified site: Secondary | ICD-10-CM | POA: Diagnosis not present

## 2021-12-04 DIAGNOSIS — N1831 Chronic kidney disease, stage 3a: Secondary | ICD-10-CM | POA: Diagnosis not present

## 2021-12-04 DIAGNOSIS — D631 Anemia in chronic kidney disease: Secondary | ICD-10-CM | POA: Diagnosis not present

## 2021-12-05 ENCOUNTER — Encounter: Payer: Self-pay | Admitting: Obstetrics & Gynecology

## 2021-12-05 ENCOUNTER — Ambulatory Visit (INDEPENDENT_AMBULATORY_CARE_PROVIDER_SITE_OTHER): Payer: Medicare Other | Admitting: Obstetrics & Gynecology

## 2021-12-05 VITALS — BP 129/57 | HR 61 | Ht 66.0 in | Wt 197.0 lb

## 2021-12-05 DIAGNOSIS — N993 Prolapse of vaginal vault after hysterectomy: Secondary | ICD-10-CM

## 2021-12-05 DIAGNOSIS — Z4689 Encounter for fitting and adjustment of other specified devices: Secondary | ICD-10-CM | POA: Diagnosis not present

## 2021-12-05 NOTE — Progress Notes (Signed)
Chief Complaint  Patient presents with   pessary maintenance    Blood pressure (!) 129/57, pulse 61, height 5' 6"  (1.676 m), weight 197 lb (89.4 kg).  [name] presents today for routine follow up related to her pessary.   She uses a Gelhorn 2 3/4 inch She reports no vaginal discharge and no vaginal bleeding   Likert scale(1 not bothersome -5 very bothersome)  :  1  Exam reveals no undue vaginal mucosal pressure of breakdown, no discharge and no vaginal bleeding.  Vaginal Epithelial Abnormality Classification System:   0 0    No abnormalities 1    Epithelial erythema 2    Granulation tissue 3    Epithelial break or erosion, 1 cm or less 4    Epithelial break or erosion, 1 cm or greater  The pessary is removed, cleaned and replaced without difficulty.      ICD-10-CM   1. Pessary maintenance< Gelhorn pessary 2 3/4 inch, original fit 8/21  Z46.89     2. 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  Florian Buff, MD  12/05/2021 11:55 AM

## 2021-12-08 ENCOUNTER — Ambulatory Visit: Payer: Medicare Other | Admitting: Obstetrics & Gynecology

## 2021-12-17 DIAGNOSIS — M109 Gout, unspecified: Secondary | ICD-10-CM | POA: Diagnosis not present

## 2021-12-17 DIAGNOSIS — K219 Gastro-esophageal reflux disease without esophagitis: Secondary | ICD-10-CM | POA: Diagnosis not present

## 2021-12-17 DIAGNOSIS — E782 Mixed hyperlipidemia: Secondary | ICD-10-CM | POA: Diagnosis not present

## 2021-12-17 DIAGNOSIS — J309 Allergic rhinitis, unspecified: Secondary | ICD-10-CM | POA: Diagnosis not present

## 2021-12-17 DIAGNOSIS — N1831 Chronic kidney disease, stage 3a: Secondary | ICD-10-CM | POA: Diagnosis not present

## 2021-12-17 DIAGNOSIS — E039 Hypothyroidism, unspecified: Secondary | ICD-10-CM | POA: Diagnosis not present

## 2021-12-17 DIAGNOSIS — E559 Vitamin D deficiency, unspecified: Secondary | ICD-10-CM | POA: Diagnosis not present

## 2021-12-17 DIAGNOSIS — K7460 Unspecified cirrhosis of liver: Secondary | ICD-10-CM | POA: Diagnosis not present

## 2021-12-17 DIAGNOSIS — Z0000 Encounter for general adult medical examination without abnormal findings: Secondary | ICD-10-CM | POA: Diagnosis not present

## 2021-12-17 DIAGNOSIS — E11319 Type 2 diabetes mellitus with unspecified diabetic retinopathy without macular edema: Secondary | ICD-10-CM | POA: Diagnosis not present

## 2021-12-17 DIAGNOSIS — D696 Thrombocytopenia, unspecified: Secondary | ICD-10-CM | POA: Diagnosis not present

## 2021-12-18 ENCOUNTER — Other Ambulatory Visit: Payer: Self-pay | Admitting: Gastroenterology

## 2021-12-18 DIAGNOSIS — Z86010 Personal history of colonic polyps: Secondary | ICD-10-CM | POA: Diagnosis not present

## 2021-12-18 DIAGNOSIS — K7460 Unspecified cirrhosis of liver: Secondary | ICD-10-CM | POA: Diagnosis not present

## 2021-12-18 DIAGNOSIS — I8500 Esophageal varices without bleeding: Secondary | ICD-10-CM | POA: Diagnosis not present

## 2021-12-18 DIAGNOSIS — Z1211 Encounter for screening for malignant neoplasm of colon: Secondary | ICD-10-CM | POA: Diagnosis not present

## 2021-12-31 DIAGNOSIS — H0288A Meibomian gland dysfunction right eye, upper and lower eyelids: Secondary | ICD-10-CM | POA: Diagnosis not present

## 2021-12-31 DIAGNOSIS — H0288B Meibomian gland dysfunction left eye, upper and lower eyelids: Secondary | ICD-10-CM | POA: Diagnosis not present

## 2021-12-31 DIAGNOSIS — H10822 Rosacea conjunctivitis, left eye: Secondary | ICD-10-CM | POA: Diagnosis not present

## 2021-12-31 DIAGNOSIS — H17822 Peripheral opacity of cornea, left eye: Secondary | ICD-10-CM | POA: Diagnosis not present

## 2022-01-01 DIAGNOSIS — Z23 Encounter for immunization: Secondary | ICD-10-CM | POA: Diagnosis not present

## 2022-03-02 ENCOUNTER — Inpatient Hospital Stay: Payer: Medicare Other

## 2022-03-03 ENCOUNTER — Inpatient Hospital Stay: Payer: Medicare Other | Attending: Physician Assistant

## 2022-03-03 DIAGNOSIS — D509 Iron deficiency anemia, unspecified: Secondary | ICD-10-CM | POA: Insufficient documentation

## 2022-03-03 DIAGNOSIS — D500 Iron deficiency anemia secondary to blood loss (chronic): Secondary | ICD-10-CM

## 2022-03-03 DIAGNOSIS — K7460 Unspecified cirrhosis of liver: Secondary | ICD-10-CM | POA: Insufficient documentation

## 2022-03-03 DIAGNOSIS — E538 Deficiency of other specified B group vitamins: Secondary | ICD-10-CM

## 2022-03-03 DIAGNOSIS — D696 Thrombocytopenia, unspecified: Secondary | ICD-10-CM | POA: Diagnosis not present

## 2022-03-03 LAB — CBC WITH DIFFERENTIAL/PLATELET
Abs Immature Granulocytes: 0.03 10*3/uL (ref 0.00–0.07)
Basophils Absolute: 0 10*3/uL (ref 0.0–0.1)
Basophils Relative: 1 %
Eosinophils Absolute: 0.4 10*3/uL (ref 0.0–0.5)
Eosinophils Relative: 6 %
HCT: 37.7 % (ref 36.0–46.0)
Hemoglobin: 12.3 g/dL (ref 12.0–15.0)
Immature Granulocytes: 1 %
Lymphocytes Relative: 18 %
Lymphs Abs: 1.2 10*3/uL (ref 0.7–4.0)
MCH: 32.1 pg (ref 26.0–34.0)
MCHC: 32.6 g/dL (ref 30.0–36.0)
MCV: 98.4 fL (ref 80.0–100.0)
Monocytes Absolute: 0.4 10*3/uL (ref 0.1–1.0)
Monocytes Relative: 7 %
Neutro Abs: 4.4 10*3/uL (ref 1.7–7.7)
Neutrophils Relative %: 67 %
Platelets: 54 10*3/uL — ABNORMAL LOW (ref 150–400)
RBC: 3.83 MIL/uL — ABNORMAL LOW (ref 3.87–5.11)
RDW: 15.1 % (ref 11.5–15.5)
WBC: 6.4 10*3/uL (ref 4.0–10.5)
nRBC: 0 % (ref 0.0–0.2)

## 2022-03-03 LAB — COMPREHENSIVE METABOLIC PANEL
ALT: 23 U/L (ref 0–44)
AST: 35 U/L (ref 15–41)
Albumin: 2.8 g/dL — ABNORMAL LOW (ref 3.5–5.0)
Alkaline Phosphatase: 171 U/L — ABNORMAL HIGH (ref 38–126)
Anion gap: 6 (ref 5–15)
BUN: 38 mg/dL — ABNORMAL HIGH (ref 8–23)
CO2: 24 mmol/L (ref 22–32)
Calcium: 8.4 mg/dL — ABNORMAL LOW (ref 8.9–10.3)
Chloride: 108 mmol/L (ref 98–111)
Creatinine, Ser: 1.24 mg/dL — ABNORMAL HIGH (ref 0.44–1.00)
GFR, Estimated: 47 mL/min — ABNORMAL LOW (ref >60)
Glucose, Bld: 109 mg/dL — ABNORMAL HIGH (ref 70–99)
Potassium: 3.7 mmol/L (ref 3.5–5.1)
Sodium: 138 mmol/L (ref 135–145)
Total Bilirubin: 1.6 mg/dL — ABNORMAL HIGH (ref 0.3–1.2)
Total Protein: 6.3 g/dL — ABNORMAL LOW (ref 6.5–8.1)

## 2022-03-03 LAB — VITAMIN B12: Vitamin B-12: 1696 pg/mL — ABNORMAL HIGH (ref 180–914)

## 2022-03-03 LAB — IRON AND TIBC
Iron: 107 ug/dL (ref 28–170)
Saturation Ratios: 43 % — ABNORMAL HIGH (ref 10.4–31.8)
TIBC: 247 ug/dL — ABNORMAL LOW (ref 250–450)
UIBC: 140 ug/dL

## 2022-03-03 LAB — FERRITIN: Ferritin: 65 ng/mL (ref 11–307)

## 2022-03-03 LAB — LACTATE DEHYDROGENASE: LDH: 179 U/L (ref 98–192)

## 2022-03-05 DIAGNOSIS — I129 Hypertensive chronic kidney disease with stage 1 through stage 4 chronic kidney disease, or unspecified chronic kidney disease: Secondary | ICD-10-CM | POA: Diagnosis not present

## 2022-03-05 DIAGNOSIS — E1165 Type 2 diabetes mellitus with hyperglycemia: Secondary | ICD-10-CM | POA: Diagnosis not present

## 2022-03-05 DIAGNOSIS — R7989 Other specified abnormal findings of blood chemistry: Secondary | ICD-10-CM | POA: Diagnosis not present

## 2022-03-05 DIAGNOSIS — E039 Hypothyroidism, unspecified: Secondary | ICD-10-CM | POA: Diagnosis not present

## 2022-03-05 DIAGNOSIS — Z780 Asymptomatic menopausal state: Secondary | ICD-10-CM | POA: Diagnosis not present

## 2022-03-05 DIAGNOSIS — M1A0790 Idiopathic chronic gout, unspecified ankle and foot, without tophus (tophi): Secondary | ICD-10-CM | POA: Diagnosis not present

## 2022-03-05 DIAGNOSIS — N1831 Chronic kidney disease, stage 3a: Secondary | ICD-10-CM | POA: Diagnosis not present

## 2022-03-05 DIAGNOSIS — E1140 Type 2 diabetes mellitus with diabetic neuropathy, unspecified: Secondary | ICD-10-CM | POA: Diagnosis not present

## 2022-03-05 DIAGNOSIS — E11319 Type 2 diabetes mellitus with unspecified diabetic retinopathy without macular edema: Secondary | ICD-10-CM | POA: Diagnosis not present

## 2022-03-06 NOTE — Progress Notes (Unsigned)
[place], [place] [address]   CLINIC:  Medical Oncology/Hematology  PCP:  Celene Squibb, MD [address] [place] [address] [phone]   REASON FOR VISIT:  Follow-up for thrombocytopenia and iron deficiency anemia in the setting of underlying cirrhosis   CURRENT THERAPY: Intermittent IV iron infusions (last Feraheme on 02/08/2020)   INTERVAL HISTORY:  Ms. Shedden  71 y.o. female returns for routine follow-up of her thrombocytopenia and iron deficiency anemia in the setting of underlying liver cirrhosis.  She was last seen by Tarri Abernethy PA-C on 08/18/2021.   At today's visit, she reports feeling fair.  No recent hospitalizations, surgeries, or changes in baseline health status.   She admits to easy bruising, but denies any petechial rash.   She has not noticed any blood loss such as epistaxis, bleeding gums, hematemesis, hematochezia, melena, or hematuria. She has chronic and multifactorial fatigue, which is at baseline. She has not had any pica, headaches, chest pain, dyspnea, or syncope.  She has been taking vitamin B12 1000 mcg weekly up until last week, when her endocrinologist instructed her to stop it due to elevated B12 levels.  She has 40% energy and 85% appetite. She endorses that she is maintaining a stable weight.   REVIEW OF SYSTEMS:  Review of Systems  Constitutional:  Positive for fatigue. Negative for appetite change, chills, diaphoresis, fever and unexpected weight change.  HENT:   Positive for trouble swallowing (teeth issues, trouble chewing). Negative for lump/mass and nosebleeds.   Eyes:  Negative for eye problems.  Respiratory:  Positive for shortness of breath (with exertion). Negative for cough and hemoptysis.   Cardiovascular:  Negative for chest pain, leg swelling and palpitations.  Gastrointestinal:  Positive for constipation and nausea. Negative for abdominal pain, blood in stool, diarrhea and  vomiting.  Genitourinary:  Negative for hematuria.   Skin: Negative.   Neurological:  Positive for dizziness, headaches and numbness. Negative for light-headedness.  Hematological:  Bruises/bleeds easily.      PAST MEDICAL/SURGICAL HISTORY:  Past Medical History:  Diagnosis Date   Alkaline phosphatase elevation    Anemia    ASD (atrial septal defect)    Cataract    both eyes hx of   Chest pain    03-17-2013 last chest pain   Chronic kidney disease    Stage III kidney disease   Cirrhosis ([place])    Depression    Diabetes mellitus type II    Fatigue    GERD (gastroesophageal reflux disease)    Gout    Headache(784.0)    occasional   Hyperlipidemia    Hypothyroidism    Neuropathy    Peripheral neuropathy    Presence of pessary    Retinopathy    Past Surgical History:  Procedure Laterality Date   APPENDECTOMY     CATARACT EXTRACTION     CERVICAL SPINE SURGERY  2006   CHOLECYSTECTOMY N/A 04/07/2013   Procedure: LAPAROSCOPIC CHOLECYSTECTOMY WITH INTRAOPERATIVE CHOLANGIOGRAM;  Surgeon: Merrie Roof, MD;  Location: [facility];  Service: General;  Laterality: N/A;   COMBINED HYSTERECTOMY VAGINAL / OOPHORECTOMY / A&P REPAIR  1987   Unilateral oophorectomy, h/o uterine prolapse has right ovary   ERCP N/A 04/06/2013   Procedure: ENDOSCOPIC RETROGRADE CHOLANGIOPANCREATOGRAPHY (ERCP);  Surgeon: Beryle Beams, MD;  Location: [facility];  Service: Endoscopy;  Laterality: N/A;   ESOPHAGEAL BANDING N/A 09/26/2021   Procedure: ESOPHAGEAL BANDING;  Surgeon: Carol Ada, MD;  Location: [facility];  Service: Gastroenterology;  Laterality: N/A;   ESOPHAGEAL BANDING N/A 10/31/2021   Procedure: ESOPHAGEAL BANDING;  Surgeon: Carol Ada, MD;  Location: [facility];  Service: Gastroenterology;  Laterality: N/A;   ESOPHAGOGASTRODUODENOSCOPY (EGD) WITH PROPOFOL N/A 08/16/2020   Procedure: ESOPHAGOGASTRODUODENOSCOPY (EGD) WITH PROPOFOL;  Surgeon: Carol Ada, MD;  Location: [facility];   Service: Endoscopy;  Laterality: N/A;   ESOPHAGOGASTRODUODENOSCOPY (EGD) WITH PROPOFOL N/A 09/26/2021   Procedure: ESOPHAGOGASTRODUODENOSCOPY (EGD) WITH PROPOFOL;  Surgeon: Carol Ada, MD;  Location: [facility];  Service: Gastroenterology;  Laterality: N/A;   ESOPHAGOGASTRODUODENOSCOPY (EGD) WITH PROPOFOL N/A 10/31/2021   Procedure: ESOPHAGOGASTRODUODENOSCOPY (EGD) WITH PROPOFOL;  Surgeon: Carol Ada, MD;  Location: [facility];  Service: Gastroenterology;  Laterality: N/A;   EUS N/A 03/31/2013   Procedure: UPPER ENDOSCOPIC ULTRASOUND (EUS) LINEAR;  Surgeon: Beryle Beams, MD;  Location: [facility];  Service: Endoscopy;  Laterality: N/A;   REFRACTIVE SURGERY     SPINAL FUSION     TONSILLECTOMY  age 43     SOCIAL HISTORY:  Social History   Socioeconomic History   Marital status: Divorced    Spouse name: Not on file   Number of children: 3   Years of education: 12   Highest education level: Not on file  Occupational History   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 Reports that her son recently moved out after living with her for a long time. She reports that she is happy to be living alone and feels like she was enabling his behavior. Reports that he had a history of drug use.   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    No pets.   Eats all food groups.    Wears seat belt.    Lives in house.    Smoke detectors.    Social Determinants of Health   Financial Resource Strain: Medium Risk (10/30/2019)   Overall Financial Resource Strain (CARDIA)     Difficulty of Paying Living Expenses: Somewhat hard  Food Insecurity: No Food Insecurity (10/30/2019)   Hunger Vital Sign    Worried About Running Out of Food in the Last Year: Never true    Ran Out of Food in the Last Year: Never true  Transportation Needs: No Transportation Needs (10/30/2019)   PRAPARE - Hydrologist (Medical): No    Lack of Transportation (Non-Medical): No  Physical Activity: Inactive (10/30/2019)   Exercise Vital Sign    Days of Exercise per Week: 0 days    Minutes of Exercise per Session: 0 min  Stress: No Stress Concern Present (10/30/2019)   [place]    Feeling of Stress : Not at all  Social Connections: Moderately Integrated (10/30/2019)   Social Connection and Isolation Panel [NHANES]    Frequency of Communication with Friends and Family: More than three times a week    Frequency of Social Gatherings with Friends  and Family: Three times a week    Attends Religious Services: More than 4 times per year    Active Member of Clubs or Organizations: Yes    Attends Archivist Meetings: 1 to 4 times per year    Marital Status: Divorced  Intimate Partner Violence: Not At Risk (10/30/2019)   Humiliation, Afraid, Rape, and Kick questionnaire    Fear of Current or Ex-Partner: No    Emotionally Abused: No    Physically Abused: No    Sexually Abused: No    FAMILY HISTORY:  Family History  Problem Relation Age of Onset   COPD Mother    Lung cancer Father    Diabetes Sister    Cataracts Sister    Insulin resistance Daughter    Insulin resistance Son     CURRENT MEDICATIONS:  Outpatient Encounter Medications as of 03/09/2022  Medication Sig Note   allopurinol (ZYLOPRIM) 100 MG tablet Take 100 mg by mouth every evening.     Carboxymethylcellulose Sod PF 0.5 % SOLN Place 1 drop into both eyes daily as needed (dry eyes). 09/17/2021: Refresh    Cholecalciferol (VITAMIN D)  125 MCG (5000 UT) CAPS Take 5,000 Units by mouth every Wednesday.    hydrOXYzine (ATARAX/VISTARIL) 25 MG tablet Take 25 mg by mouth 3 (three) times daily as needed for anxiety or itching (Helps with neuropathy).    insulin regular (NOVOLIN R,HUMULIN R) 100 units/mL injection Inject 5-8 Units into the skin 2 (two) times daily before a meal.    levothyroxine (SYNTHROID) 137 MCG tablet Take 137 mcg by mouth daily before breakfast.    loratadine (CLARITIN) 10 MG tablet Take 10 mg by mouth every other day.    nadolol (CORGARD) 40 MG tablet Take 40 mg by mouth daily.    NOVOLIN N RELION 100 UNIT/ML injection Inject 12-14 Units into the skin in the morning and at bedtime. After meals    ONETOUCH ULTRA test strip     torsemide (DEMADEX) 20 MG tablet Take 20 mg by mouth 2 (two) times daily.    vitamin B-12 (CYANOCOBALAMIN) 1000 MCG tablet Take 1 tablet (1,000 mcg total) by mouth daily. (Patient taking differently: Take 1,000 mcg by mouth every Monday.)    Vitamin D, Ergocalciferol, (DRISDOL) 1.25 MG (50000 UT) CAPS capsule Take 50,000 Units by mouth every Saturday.    No facility-administered encounter medications on file as of 03/09/2022.    ALLERGIES:  Allergies  Allergen Reactions   Atorvastatin     Other reaction(s): arthralgia/myalgia   Ciprofloxacin     Other reaction(s): severe yeast infection   Crestor [Rosuvastatin]     Other reaction(s): arthralgia/myalgia   Ezetimibe     Other reaction(s): Unknown   Lisinopril     Other reaction(s): cough   Losartan Potassium     Other reaction(s): cough   Lovastatin     Other reaction(s): arthralgia/myalgia   Statins Other (See Comments)    REACTION: diarrhea/joint pain/muscle aches. Tried at low doses.  Other reaction(s): arthralgia/myalgia   Welchol [Colesevelam]     Other reaction(s): arthralgia/myalgia   Dilaudid [Hydromorphone Hcl] Nausea And Vomiting    After one vomiting episode no further vomiting     PHYSICAL EXAM:   ECOG  PERFORMANCE STATUS: 1 - Symptomatic but completely ambulatory  There were no vitals filed for this visit. There were no vitals filed for this visit. Physical Exam Constitutional:      Appearance: Normal appearance. She is obese.  HENT:  Head: Normocephalic and atraumatic.     Mouth/Throat:     Mouth: Mucous membranes are moist.  Eyes:     Extraocular Movements: Extraocular movements intact.     Pupils: Pupils are equal, round, and reactive to light.  Cardiovascular:     Rate and Rhythm: Normal rate and regular rhythm.     Pulses: Normal pulses.     Heart sounds: Normal heart sounds.  Pulmonary:     Effort: Pulmonary effort is normal.     Breath sounds: Normal breath sounds.  Abdominal:     General: Bowel sounds are normal.     Palpations: Abdomen is soft.     Tenderness: There is no abdominal tenderness.  Musculoskeletal:        General: No swelling.     Right lower leg: No edema.     Left lower leg: No edema.  Lymphadenopathy:     Cervical: No cervical adenopathy.  Skin:    General: Skin is warm and dry.     Findings: Bruising present.  Neurological:     General: No focal deficit present.     Mental Status: She is alert and oriented to person, place, and time.  Psychiatric:        Mood and Affect: Mood normal.        Behavior: Behavior normal.     LABORATORY DATA:  I have reviewed the labs as listed.  CBC    Component Value Date/Time   WBC 6.4 03/03/2022 1319   RBC 3.83 (L) 03/03/2022 1319   HGB 12.3 03/03/2022 1319   HCT 37.7 03/03/2022 1319   PLT 54 (L) 03/03/2022 1319   MCV 98.4 03/03/2022 1319   MCH 32.1 03/03/2022 1319   MCHC 32.6 03/03/2022 1319   RDW 15.1 03/03/2022 1319   LYMPHSABS 1.2 03/03/2022 1319   MONOABS 0.4 03/03/2022 1319   EOSABS 0.4 03/03/2022 1319   BASOSABS 0.0 03/03/2022 1319      Latest Ref Rng & Units 03/03/2022    1:19 PM 10/31/2021    7:09 AM 04/03/2021    2:00 PM  CMP  Glucose 70 - 99 mg/dL 109  131  215   BUN 8 - 23 mg/dL  38  37  46   Creatinine 0.44 - 1.00 mg/dL 1.24  1.60  1.40   Sodium 135 - 145 mmol/L 138  142  138   Potassium 3.5 - 5.1 mmol/L 3.7  3.5  3.7   Chloride 98 - 111 mmol/L 108  104  104   CO2 22 - 32 mmol/L 24   25   Calcium 8.9 - 10.3 mg/dL 8.4   8.2   Total Protein 6.5 - 8.1 g/dL 6.3   5.9   Total Bilirubin 0.3 - 1.2 mg/dL 1.6   1.2   Alkaline Phos 38 - 126 U/L 171   110   AST 15 - 41 U/L 35   33   ALT 0 - 44 U/L 23   21     DIAGNOSTIC IMAGING:  I have independently reviewed the relevant imaging and discussed with the patient.  ASSESSMENT & PLAN: 1.  Moderate thrombocytopenia: - Her CBC on 07/06/2017 shows a platelet count of 66.  She has mild thrombocytopenia, mostly above 100 since 2015.  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 - Differential diagnosis favors thrombocytopenia secondary to splenomegaly, less likely to be immune mediated thrombocytopenia or possible MDS. - Nutritional deficiency work-up was negative.  ANA was  1:80 speckled pattern positive, although weakly.  She does not have any clinical signs or symptoms of lupus.  We considered it likely to be false-positive. - Review of most recent labs (03/03/2022): Platelets 54, at baseline.  Normal WBC and differential. - Denies any current signs or symptoms of bleeding does have some mild easy bruising without petechial rash      - PLAN:   Platelets remained stable at baseline.  No indication for treatment at this time.  Labs and RTC in 6 months.   2.  Normocytic anemia: - Anemia secondary to iron deficiency and anemia of chronic disease.  She does also have evidence of CKD stage IIIb. - She denies any gross blood loss, rectal hemorrhage or melena      - Unable to tolerate oral iron due to GI upset - Most recent IV iron was Feraheme on 02/05/2020 without much improvement in energy - EGD (09/26/2021): Large esophageal varices, partially eradicated with banding.  Mild portal hypertensive gastropathy. -  Repeat EGD (10/31/2021): Large varices with banding x 5, partially eradicated varices.  Mild portal hypertensive gastropathy. - Most recent labs (03/03/2022): Hgb 12.3/MCV 98.4, ferritin 65, iron saturation 43%. - PLAN:   No strong indication for IV iron at this time.   - Could consider alternative iron supplement (i.e. ferrous bisglycinate) in the future, as patient was unable to tolerate ferrous sulfate and is hesitant about IV iron due to financial constraints. - Repeat labs and RTC in 6 months.   3.  B12 deficiency - Patient was started on vitamin B12 supplementation in September 2022 due to elevated methylmalonic acid - Most recent labs (03/03/2022): Vitamin B12 elevated at 1696, MMA normal  - She had been taking vitamin B12 1000 mcg once weekly, recently stopped by endocrinology due to elevated B12 levels - PLAN: Agree with STOPPING vitamin B12 supplementation at this time. - Recheck B12/MMA in 6 months    PLAN SUMMARY: >> Labs in 6 months (CBC/D, CMP, B12, MMA, ferritin, iron/TIBC) >> Office visit 1 week after labs   All questions were answered. The patient knows to call the clinic with any problems, questions or concerns.  Medical decision making: Moderate  Time spent on visit: I spent 20 minutes counseling the patient face to face. The total time spent in the appointment was 30 minutes and more than 50% was on counseling.   Harriett Rush, PA-C  03/09/2022 2:59 PM

## 2022-03-09 ENCOUNTER — Inpatient Hospital Stay (HOSPITAL_BASED_OUTPATIENT_CLINIC_OR_DEPARTMENT_OTHER): Payer: Medicare Other | Admitting: Physician Assistant

## 2022-03-09 ENCOUNTER — Other Ambulatory Visit: Payer: Self-pay

## 2022-03-09 VITALS — BP 116/57 | HR 60 | Temp 98.8°F | Resp 18 | Ht 66.0 in | Wt 198.2 lb

## 2022-03-09 DIAGNOSIS — D696 Thrombocytopenia, unspecified: Secondary | ICD-10-CM | POA: Diagnosis not present

## 2022-03-09 DIAGNOSIS — E538 Deficiency of other specified B group vitamins: Secondary | ICD-10-CM | POA: Diagnosis not present

## 2022-03-09 DIAGNOSIS — K7460 Unspecified cirrhosis of liver: Secondary | ICD-10-CM | POA: Diagnosis not present

## 2022-03-09 DIAGNOSIS — D500 Iron deficiency anemia secondary to blood loss (chronic): Secondary | ICD-10-CM

## 2022-03-09 DIAGNOSIS — D509 Iron deficiency anemia, unspecified: Secondary | ICD-10-CM | POA: Diagnosis not present

## 2022-03-09 LAB — METHYLMALONIC ACID, SERUM: Methylmalonic Acid, Quantitative: 290 nmol/L (ref 0–378)

## 2022-03-09 NOTE — Patient Instructions (Signed)
[place] at [facility] Discharge Instructions  You were seen today by Tarri Abernethy PA-C for your low platelets, iron deficiency anemia, and low vitamin B12.  Your iron levels are slightly improved.  You do not need any IV iron at this appointment.  Your hemoglobin and red blood cells are normal - you do not have any anemia at this visit.  Your platelets are still low, but are stable at their usual baseline.  Your B12 level is still higher than it should be.  You can STOP your B12 at this time.  We will recheck your B12 levels at your follow-up visit in 6 months.   LABS: Return in 6 months for repeat labs   OTHER TESTS: None  FOLLOW-UP APPOINTMENT: Office visit in 6 months, after labs   Thank you for choosing [place] at [facility] to provide your oncology and hematology care.  To afford each patient quality time with our provider, please arrive at least 15 minutes before your scheduled appointment time.   If you have a lab appointment with the [facility] please come in thru the Main Entrance and check in at the main information desk.  You need to re-schedule your appointment should you arrive 10 or more minutes late.  We strive to give you quality time with our providers, and arriving late affects you and other patients whose appointments are after yours.  Also, if you no show three or more times for appointments you may be dismissed from the clinic at the providers discretion.     Again, thank you for choosing [facility].  Our hope is that these requests will decrease the amount of time that you wait before being seen by our physicians.       _____________________________________________________________  Should you have questions after your visit to [facility], please contact our office at [phone] and follow the prompts.  Our office hours are 8:00 a.m. and 4:30 p.m. Monday - Friday.  Please  note that voicemails left after 4:00 p.m. may not be returned until the following business day.  We are closed weekends and major holidays.  You do have access to a nurse 24-7, just call the main number to the clinic [phone] and do not press any options, hold on the line and a nurse will answer the phone.    For prescription refill requests, have your pharmacy contact our office and allow 72 hours.    Due to Covid, you will need to wear a mask upon entering the hospital. If you do not have a mask, a mask will be given to you at the Main Entrance upon arrival. For doctor visits, patients may have 1 support person age 71 or older with them. For treatment visits, patients can not have anyone with them due to social distancing guidelines and our immunocompromised population.

## 2022-03-25 ENCOUNTER — Other Ambulatory Visit: Payer: Self-pay

## 2022-03-25 ENCOUNTER — Encounter (HOSPITAL_COMMUNITY): Payer: Self-pay | Admitting: Gastroenterology

## 2022-03-25 NOTE — Progress Notes (Signed)
Attempted to obtain medical history. Unable to reach pt. At this time. HIPAA complaint voicemail left with pre-surgical testing number.

## 2022-04-06 ENCOUNTER — Ambulatory Visit: Admission: RE | Admit: 2022-04-06 | Payer: Medicare Other | Source: Ambulatory Visit | Admitting: Gastroenterology

## 2022-04-06 ENCOUNTER — Ambulatory Visit: Payer: Medicare Other | Admitting: Obstetrics & Gynecology

## 2022-04-06 HISTORY — DX: Essential (primary) hypertension: I10

## 2022-04-06 HISTORY — DX: Family history of other specified conditions: Z84.89

## 2022-04-06 HISTORY — DX: Other specified disorders of bone density and structure, unspecified site: M85.80

## 2022-04-06 SURGERY — ESOPHAGOGASTRODUODENOSCOPY (EGD) WITH PROPOFOL
Anesthesia: Monitor Anesthesia Care

## 2022-04-13 ENCOUNTER — Encounter: Payer: Self-pay | Admitting: Obstetrics & Gynecology

## 2022-04-13 ENCOUNTER — Ambulatory Visit (INDEPENDENT_AMBULATORY_CARE_PROVIDER_SITE_OTHER): Payer: Medicare Other | Admitting: Obstetrics & Gynecology

## 2022-04-13 VITALS — BP 134/61 | HR 58 | Ht 66.0 in | Wt 199.0 lb

## 2022-04-13 DIAGNOSIS — N993 Prolapse of vaginal vault after hysterectomy: Secondary | ICD-10-CM | POA: Diagnosis not present

## 2022-04-13 DIAGNOSIS — Z4689 Encounter for fitting and adjustment of other specified devices: Secondary | ICD-10-CM

## 2022-04-13 NOTE — Progress Notes (Signed)
Chief Complaint  Patient presents with   Follow-up    Pessary check     Blood pressure 134/61, pulse (!) 58, height '5\' 6"'$  (1.676 m), weight 199 lb (90.3 kg).  [name] presents today for routine follow up related to her pessary.   She uses a Gelhorn pessary 2 34 inches She reports no vaginal discharge and no vaginal bleeding   Likert scale(1 not bothersome -5 very bothersome)  :  1  Exam reveals no undue vaginal mucosal pressure of breakdown, no discharge and no vaginal bleeding.  Vaginal Epithelial Abnormality Classification System:   0 0    No abnormalities 1    Epithelial erythema 2    Granulation tissue 3    Epithelial break or erosion, 1 cm or less 4    Epithelial break or erosion, 1 cm or greater  The pessary is removed, cleaned and replaced without difficulty.      ICD-10-CM   1. Pessary maintenance< Gelhorn pessary 2 3/4 inch, original fit 8/21  Z46.89     2. 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 Per patint request  Florian Buff, MD  04/13/2022 2:14 PM

## 2022-04-15 DIAGNOSIS — H10822 Rosacea conjunctivitis, left eye: Secondary | ICD-10-CM | POA: Diagnosis not present

## 2022-04-17 ENCOUNTER — Other Ambulatory Visit: Payer: Self-pay | Admitting: Gastroenterology

## 2022-05-02 IMAGING — MG DIGITAL SCREENING BILAT W/ TOMO W/ CAD
6 of 10 series · 6 of 30 positions shown · non-contrast
Comparison: Previous exam(s).

CLINICAL DATA: Screening.

EXAM:
DIGITAL SCREENING BILATERAL MAMMOGRAM WITH TOMO AND CAD

[L MLO synth-2D (1 of 2)]
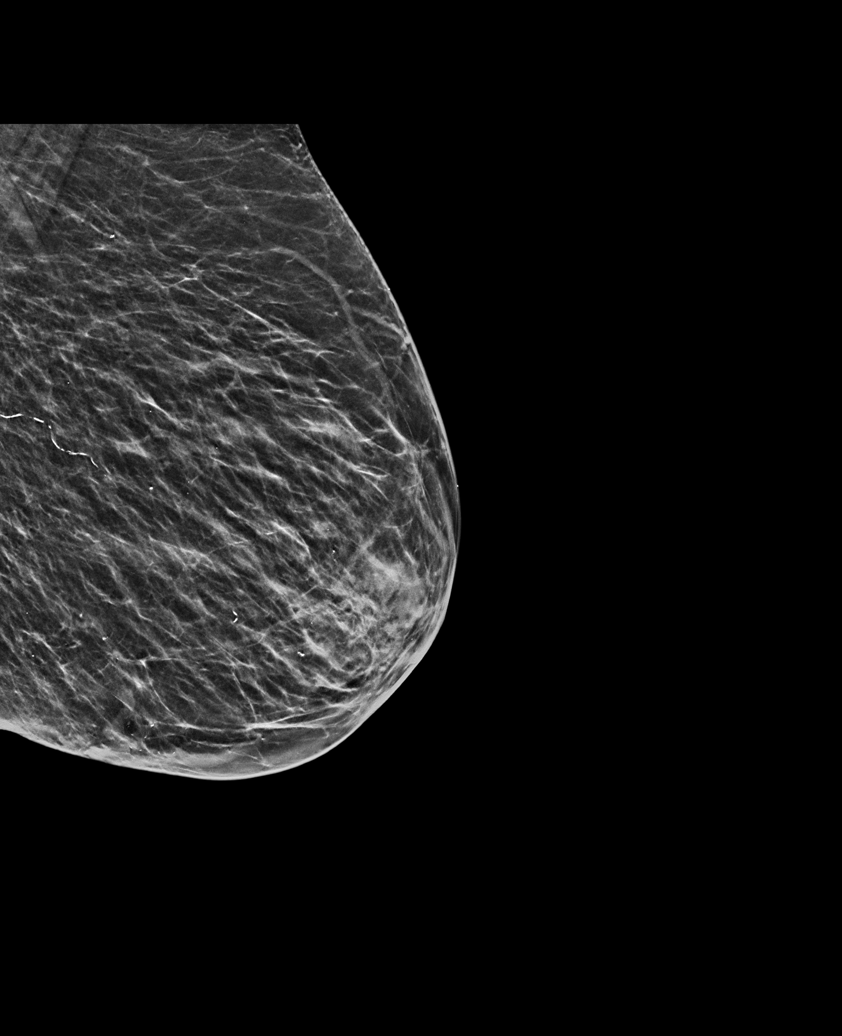

[R CC synth-2D]
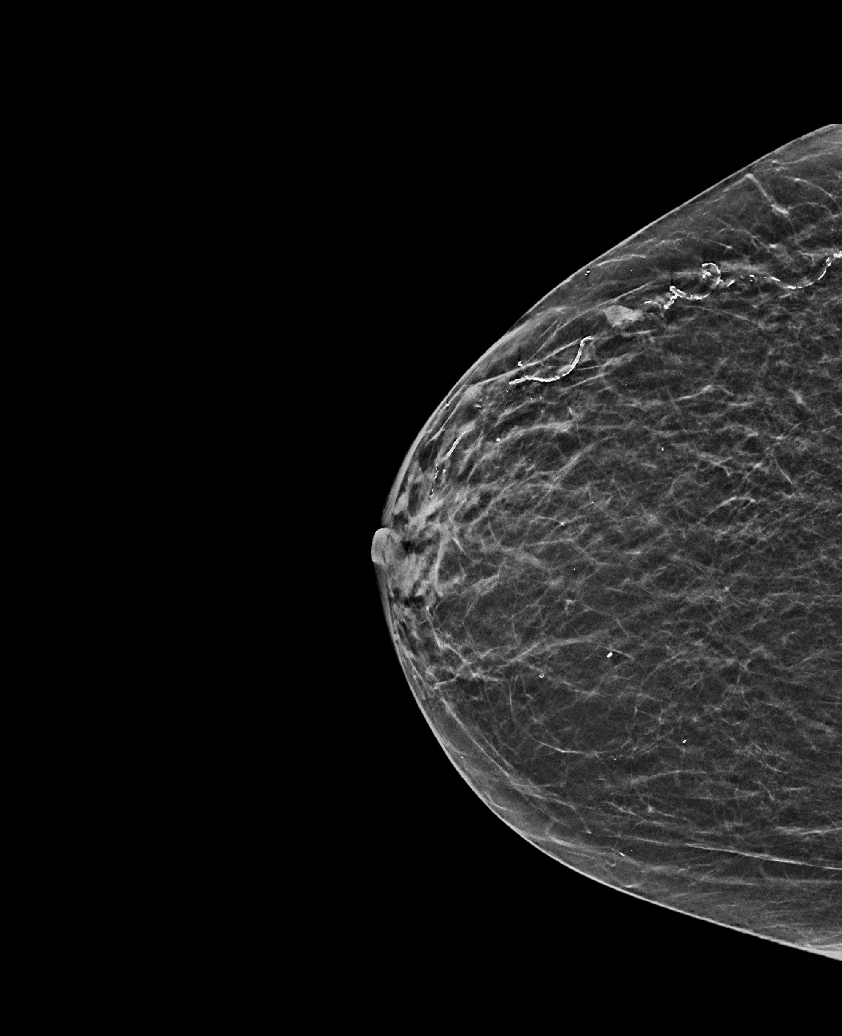

[R MLO synth-2D]
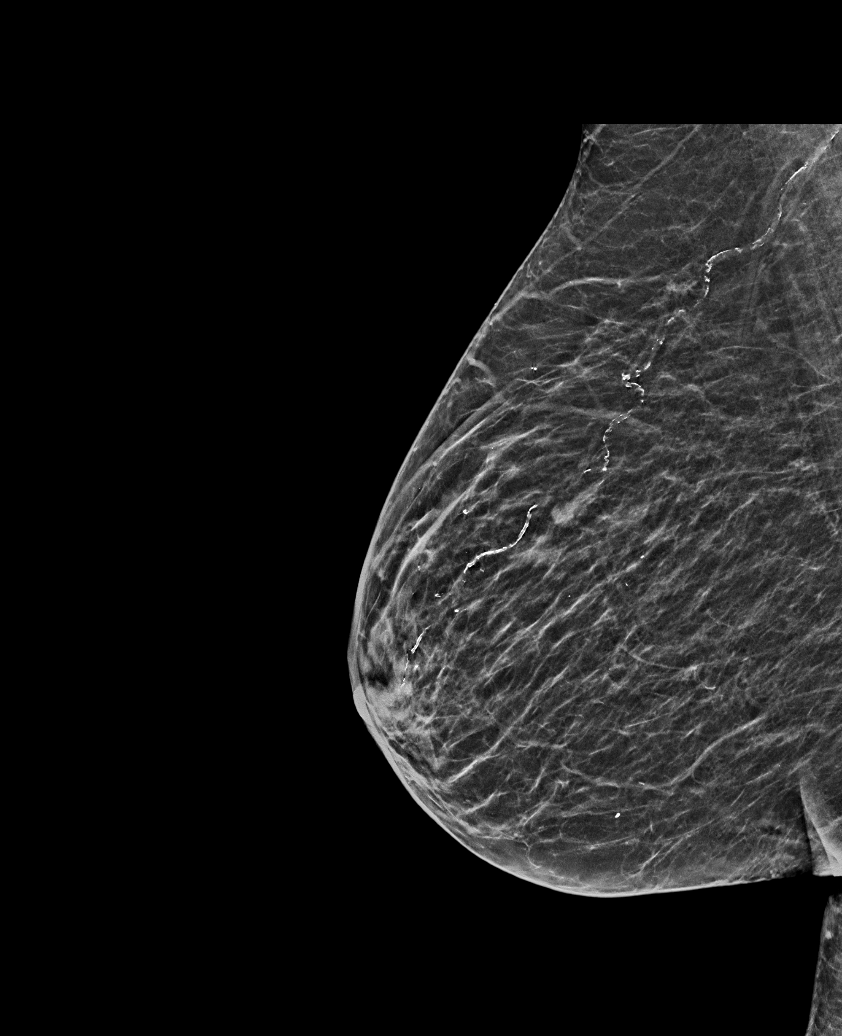

[L MLO synth-2D (2 of 2)]
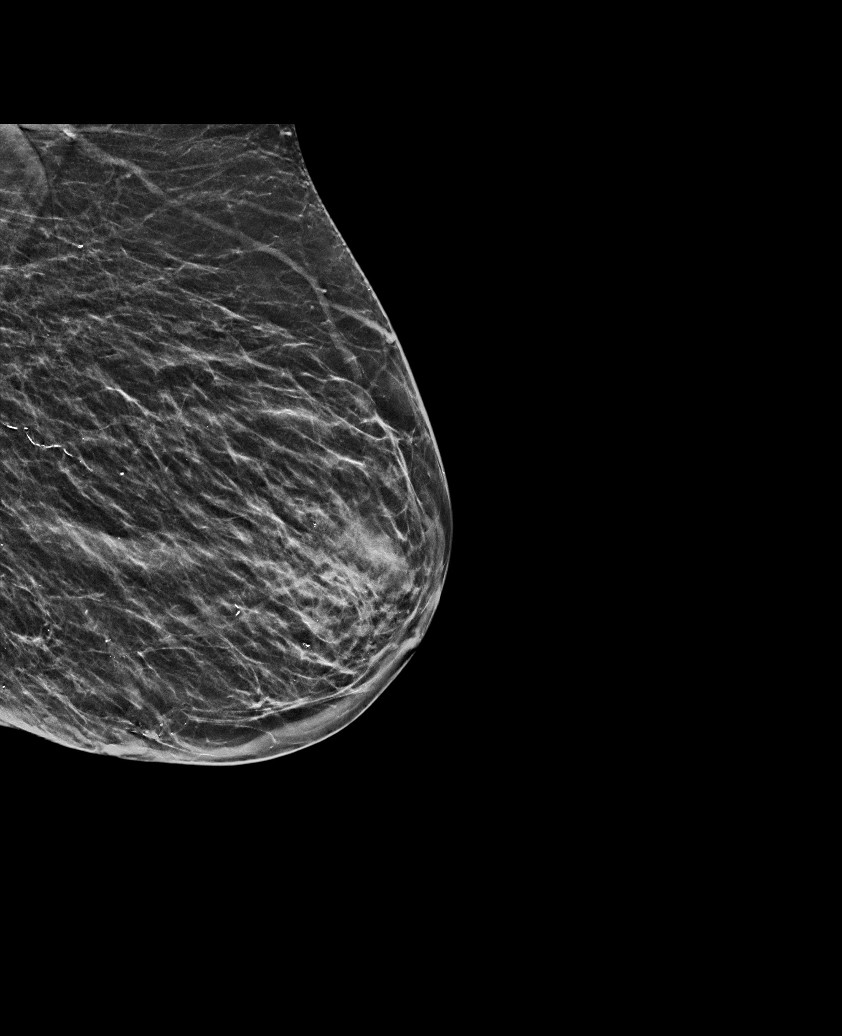

[L CC synth-2D]
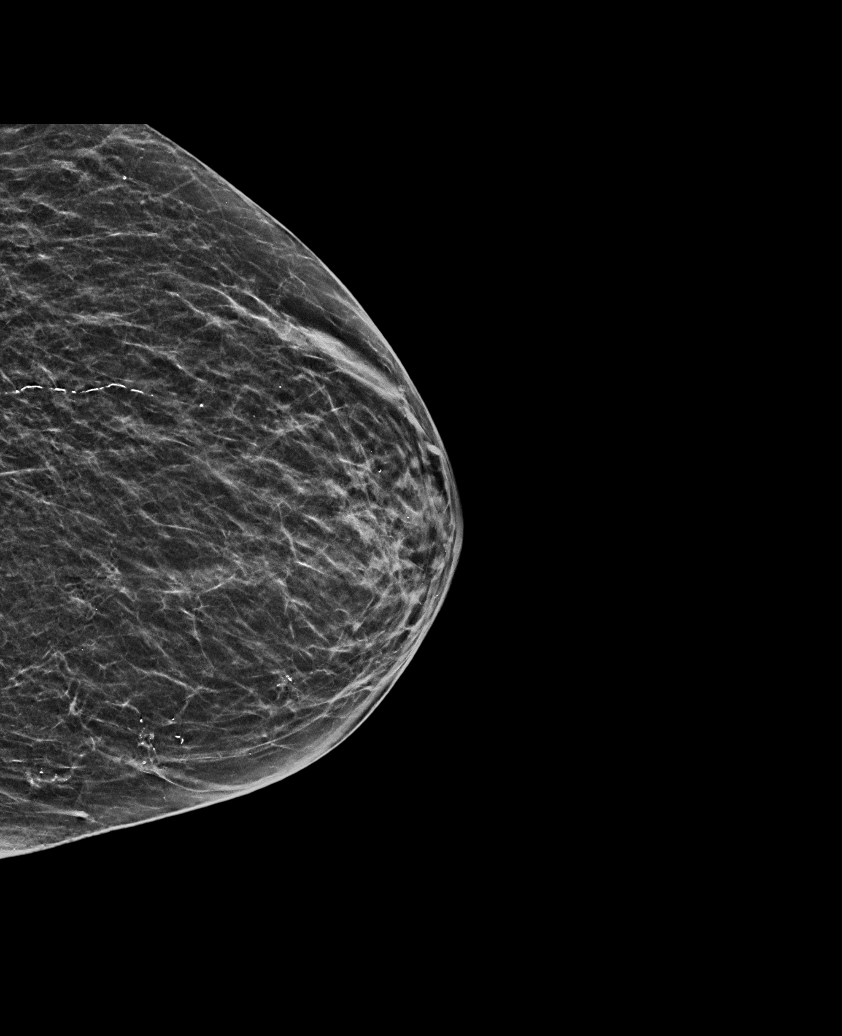

[L MLO tomo · tomo slice 25/50.0]
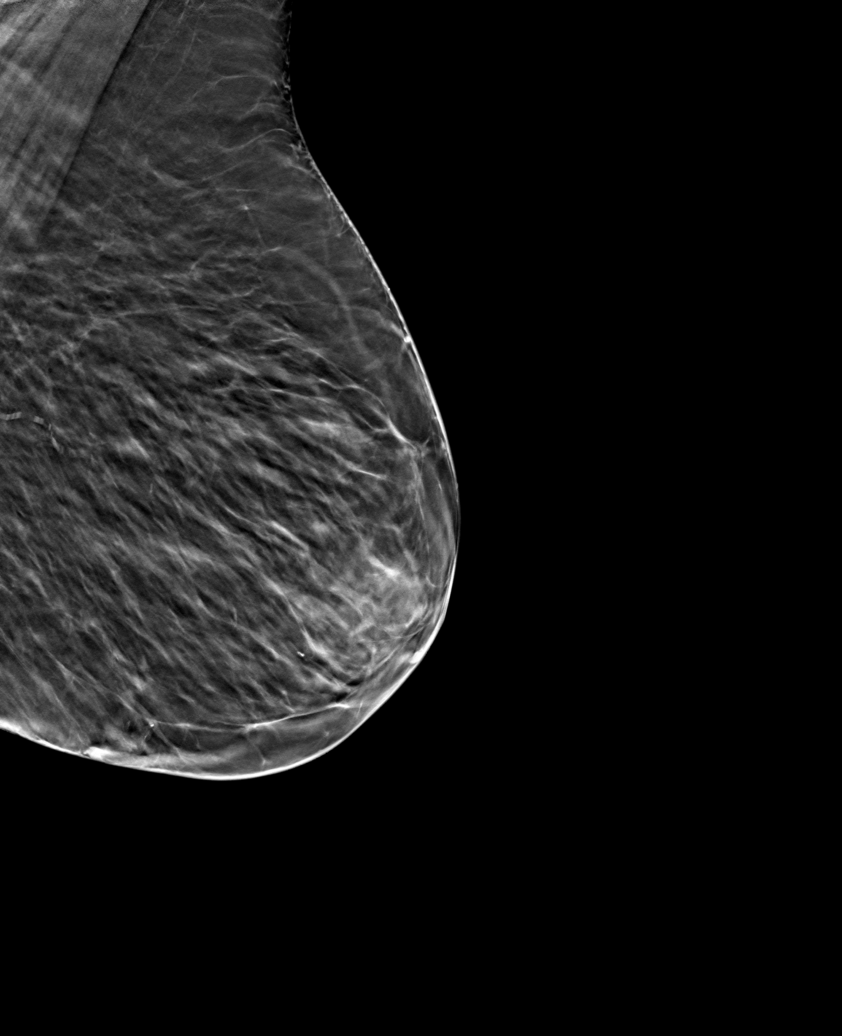

[6 of 30 positions shown; findings below may reference images not displayed]

ACR Breast Density Category b: There are scattered areas of
fibroglandular density.
FINDINGS: There are no findings suspicious for malignancy. Images were
processed with CAD.
IMPRESSION: No mammographic evidence of malignancy. A result letter of this
screening mammogram will be mailed directly to the patient.

RECOMMENDATION:
Screening mammogram in one year. (Code:[id number])

BI-RADS CATEGORY  1: Negative.

## 2022-06-11 DIAGNOSIS — N1831 Chronic kidney disease, stage 3a: Secondary | ICD-10-CM | POA: Diagnosis not present

## 2022-06-11 DIAGNOSIS — H00022 Hordeolum internum right lower eyelid: Secondary | ICD-10-CM | POA: Diagnosis not present

## 2022-06-11 DIAGNOSIS — E782 Mixed hyperlipidemia: Secondary | ICD-10-CM | POA: Diagnosis not present

## 2022-06-11 DIAGNOSIS — H00021 Hordeolum internum right upper eyelid: Secondary | ICD-10-CM | POA: Diagnosis not present

## 2022-06-16 DIAGNOSIS — E669 Obesity, unspecified: Secondary | ICD-10-CM | POA: Insufficient documentation

## 2022-06-18 DIAGNOSIS — K7460 Unspecified cirrhosis of liver: Secondary | ICD-10-CM | POA: Diagnosis not present

## 2022-06-18 DIAGNOSIS — E11319 Type 2 diabetes mellitus with unspecified diabetic retinopathy without macular edema: Secondary | ICD-10-CM | POA: Diagnosis not present

## 2022-06-18 DIAGNOSIS — E1122 Type 2 diabetes mellitus with diabetic chronic kidney disease: Secondary | ICD-10-CM | POA: Diagnosis not present

## 2022-06-18 DIAGNOSIS — E039 Hypothyroidism, unspecified: Secondary | ICD-10-CM | POA: Diagnosis not present

## 2022-06-18 DIAGNOSIS — N189 Chronic kidney disease, unspecified: Secondary | ICD-10-CM | POA: Diagnosis not present

## 2022-06-18 DIAGNOSIS — E559 Vitamin D deficiency, unspecified: Secondary | ICD-10-CM | POA: Diagnosis not present

## 2022-06-18 DIAGNOSIS — M109 Gout, unspecified: Secondary | ICD-10-CM | POA: Diagnosis not present

## 2022-06-18 DIAGNOSIS — J309 Allergic rhinitis, unspecified: Secondary | ICD-10-CM | POA: Diagnosis not present

## 2022-06-18 DIAGNOSIS — N1831 Chronic kidney disease, stage 3a: Secondary | ICD-10-CM | POA: Diagnosis not present

## 2022-06-18 DIAGNOSIS — D696 Thrombocytopenia, unspecified: Secondary | ICD-10-CM | POA: Diagnosis not present

## 2022-06-18 DIAGNOSIS — E782 Mixed hyperlipidemia: Secondary | ICD-10-CM | POA: Diagnosis not present

## 2022-06-19 DIAGNOSIS — E559 Vitamin D deficiency, unspecified: Secondary | ICD-10-CM | POA: Diagnosis not present

## 2022-06-19 DIAGNOSIS — N189 Chronic kidney disease, unspecified: Secondary | ICD-10-CM | POA: Diagnosis not present

## 2022-06-19 DIAGNOSIS — N1831 Chronic kidney disease, stage 3a: Secondary | ICD-10-CM | POA: Diagnosis not present

## 2022-06-23 DIAGNOSIS — I129 Hypertensive chronic kidney disease with stage 1 through stage 4 chronic kidney disease, or unspecified chronic kidney disease: Secondary | ICD-10-CM | POA: Diagnosis not present

## 2022-06-23 DIAGNOSIS — M898X9 Other specified disorders of bone, unspecified site: Secondary | ICD-10-CM | POA: Diagnosis not present

## 2022-06-23 DIAGNOSIS — E1122 Type 2 diabetes mellitus with diabetic chronic kidney disease: Secondary | ICD-10-CM | POA: Diagnosis not present

## 2022-06-23 DIAGNOSIS — N1831 Chronic kidney disease, stage 3a: Secondary | ICD-10-CM | POA: Diagnosis not present

## 2022-06-23 DIAGNOSIS — D631 Anemia in chronic kidney disease: Secondary | ICD-10-CM | POA: Diagnosis not present

## 2022-06-24 DIAGNOSIS — N390 Urinary tract infection, site not specified: Secondary | ICD-10-CM | POA: Diagnosis not present

## 2022-06-24 DIAGNOSIS — N183 Chronic kidney disease, stage 3 unspecified: Secondary | ICD-10-CM | POA: Diagnosis not present

## 2022-07-03 ENCOUNTER — Encounter (HOSPITAL_COMMUNITY): Payer: Self-pay | Admitting: Hematology

## 2022-07-07 ENCOUNTER — Ambulatory Visit: Payer: Medicare Other | Admitting: Obstetrics & Gynecology

## 2022-07-07 ENCOUNTER — Encounter: Payer: Self-pay | Admitting: Obstetrics & Gynecology

## 2022-07-07 VITALS — BP 118/55 | HR 62 | Ht 66.0 in | Wt 200.0 lb

## 2022-07-07 DIAGNOSIS — N993 Prolapse of vaginal vault after hysterectomy: Secondary | ICD-10-CM | POA: Diagnosis not present

## 2022-07-07 DIAGNOSIS — Z4689 Encounter for fitting and adjustment of other specified devices: Secondary | ICD-10-CM

## 2022-07-07 NOTE — Progress Notes (Signed)
Chief Complaint  Patient presents with   Bladder is out    Pessary is still in    Blood pressure (!) 118/55, pulse 62, height 5\' 6"  (1.676 m), weight 200 lb (90.7 kg).  [name] presents today for routine follow up related to her pessary.   She uses a Gelhorn 2 3/4 inch  She reports no vaginal discharge and no vaginal bleeding  She does report bladder coming out around the pessary which is also chronic  Likert scale(1 not bothersome -5 very bothersome)  :  1  Exam reveals no undue vaginal mucosal pressure of breakdown, no discharge and no vaginal bleeding.  Vaginal Epithelial Abnormality Classification System:   0 0    No abnormalities 1    Epithelial erythema 2    Granulation tissue 3    Epithelial break or erosion, 1 cm or less 4    Epithelial break or erosion, 1 cm or greater  The pessary is removed, cleaned and replaced without difficulty.      ICD-10-CM   1. Pessary maintenance< Gelhorn pessary 2 3/4 inch, original fit 8/21  Z46.89     2. 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  Lazaro Arms, MD  07/07/2022 4:23 PM

## 2022-07-09 ENCOUNTER — Encounter (HOSPITAL_COMMUNITY): Payer: Self-pay | Admitting: Hematology

## 2022-07-09 ENCOUNTER — Ambulatory Visit: Payer: Medicare Other | Admitting: Obstetrics & Gynecology

## 2022-07-10 ENCOUNTER — Encounter (HOSPITAL_COMMUNITY): Payer: Self-pay | Admitting: Gastroenterology

## 2022-07-10 NOTE — Progress Notes (Signed)
Attempted to obtain medical history via telephone, unable to reach at this time. HIPAA compliant voicemail message left requesting return call to pre surgical testing department. 

## 2022-07-13 ENCOUNTER — Ambulatory Visit: Payer: Medicare Other | Admitting: Obstetrics & Gynecology

## 2022-07-13 NOTE — Progress Notes (Signed)
Einar Crow Prep instructions- reviewed  Orlene Och MD Cardiologist-none  EKG-07/16/20 Echo- 2011 Cath-n/a Stress- n/a ICD/PM-n/a Blood thinner- n/a GLP-1-n/a  Anesthesia Review: Hx of HTN, CKD, Atrial septal defect,  NASH cirrhosis, Esophageal varcies, DM. Per patient her son had a colonoscopy/EGD around christmas 2023 for bleeding and they couldn't wake him up after procedure had to be on ventilator for a few days but was a known heavy drinker and had varices. Per pt she has not had any issues with anesthesia in the past.

## 2022-07-16 ENCOUNTER — Emergency Department (HOSPITAL_COMMUNITY)
Admission: EM | Admit: 2022-07-16 | Discharge: 2022-07-17 | Disposition: A | Discharge status: Home / Self Care | Payer: Medicare Other | Attending: Emergency Medicine | Admitting: Emergency Medicine

## 2022-07-16 ENCOUNTER — Encounter (HOSPITAL_COMMUNITY): Payer: Self-pay

## 2022-07-16 ENCOUNTER — Other Ambulatory Visit: Payer: Self-pay

## 2022-07-16 DIAGNOSIS — D696 Thrombocytopenia, unspecified: Secondary | ICD-10-CM | POA: Insufficient documentation

## 2022-07-16 DIAGNOSIS — R339 Retention of urine, unspecified: Secondary | ICD-10-CM | POA: Diagnosis present

## 2022-07-16 DIAGNOSIS — Z794 Long term (current) use of insulin: Secondary | ICD-10-CM | POA: Insufficient documentation

## 2022-07-16 DIAGNOSIS — N289 Disorder of kidney and ureter, unspecified: Secondary | ICD-10-CM | POA: Diagnosis not present

## 2022-07-16 DIAGNOSIS — R809 Proteinuria, unspecified: Secondary | ICD-10-CM | POA: Diagnosis not present

## 2022-07-16 DIAGNOSIS — D649 Anemia, unspecified: Secondary | ICD-10-CM | POA: Diagnosis not present

## 2022-07-16 DIAGNOSIS — R3911 Hesitancy of micturition: Secondary | ICD-10-CM

## 2022-07-16 LAB — I-STAT CHEM 8, ED
BUN: 41 mg/dL — ABNORMAL HIGH (ref 8–23)
Calcium, Ion: 1.12 mmol/L — ABNORMAL LOW (ref 1.15–1.40)
Chloride: 103 mmol/L (ref 98–111)
Creatinine, Ser: 1.5 mg/dL — ABNORMAL HIGH (ref 0.44–1.00)
Glucose, Bld: 75 mg/dL (ref 70–99)
HCT: 36 % (ref 36.0–46.0)
Hemoglobin: 12.2 g/dL (ref 12.0–15.0)
Potassium: 3.6 mmol/L (ref 3.5–5.1)
Sodium: 142 mmol/L (ref 135–145)
TCO2: 29 mmol/L (ref 22–32)

## 2022-07-16 NOTE — ED Provider Triage Note (Signed)
Emergency Medicine Provider Triage Evaluation Note  [name] , a 72 y.o. female  was evaluated in triage.  Pt complains of difficulty urinating.  Pt reports she feels like her bladder is full.  Pt is doing a bowel prep for tomorrow.   Review of Systems  Positive: Difficulty urinating  Negative: fever  Physical Exam  BP 134/66   Pulse 65   Temp 97.7 F (36.5 C) (Oral)   Resp 19   Ht  (1.676 m)   Wt 90.7 kg   SpO2 98%   BMI 32.27 kg/m  Gen:   Awake, no distress   Resp:  Normal effort  MSK:   Moves extremities without difficulty  Other:    Medical Decision Making  Medically screening exam initiated at 11:00 PM.  Appropriate orders placed.  ADLER ALTON was informed that the remainder of the evaluation will be completed by another provider, this initial triage assessment does not replace that evaluation, and the importance of remaining in the ED until their evaluation is complete.  Bladder scan ordered     Elson Areas, Cordelia Poche 07/16/22 2302

## 2022-07-16 NOTE — ED Triage Notes (Signed)
Patient reports she hasn't been able to urinate since this morning. Has device for prolapsed bladder and states this doesn't work as well and is supposed to have endoscopy and colonoscopy in the morning.

## 2022-07-17 ENCOUNTER — Encounter (HOSPITAL_COMMUNITY): Payer: Self-pay | Admitting: Certified Registered Nurse Anesthetist

## 2022-07-17 ENCOUNTER — Ambulatory Visit (HOSPITAL_COMMUNITY): Admission: RE | Admit: 2022-07-17 | Payer: Medicare Other | Source: Home / Self Care | Admitting: Gastroenterology

## 2022-07-17 LAB — URINALYSIS, ROUTINE W REFLEX MICROSCOPIC
Bilirubin Urine: NEGATIVE
Glucose, UA: NEGATIVE mg/dL
Ketones, ur: NEGATIVE mg/dL
Nitrite: NEGATIVE
Protein, ur: NEGATIVE mg/dL
Specific Gravity, Urine: 1.008 (ref 1.005–1.030)
pH: 6 (ref 5.0–8.0)

## 2022-07-17 LAB — CBC WITH DIFFERENTIAL/PLATELET
Abs Immature Granulocytes: 0.03 10*3/uL (ref 0.00–0.07)
Basophils Absolute: 0 10*3/uL (ref 0.0–0.1)
Basophils Relative: 0 %
Eosinophils Absolute: 0.5 10*3/uL (ref 0.0–0.5)
Eosinophils Relative: 6 %
HCT: 36.4 % (ref 36.0–46.0)
Hemoglobin: 11.8 g/dL — ABNORMAL LOW (ref 12.0–15.0)
Immature Granulocytes: 0 %
Lymphocytes Relative: 16 %
Lymphs Abs: 1.3 10*3/uL (ref 0.7–4.0)
MCH: 32.2 pg (ref 26.0–34.0)
MCHC: 32.4 g/dL (ref 30.0–36.0)
MCV: 99.5 fL (ref 80.0–100.0)
Monocytes Absolute: 0.6 10*3/uL (ref 0.1–1.0)
Monocytes Relative: 7 %
Neutro Abs: 6 10*3/uL (ref 1.7–7.7)
Neutrophils Relative %: 71 %
Platelets: 64 10*3/uL — ABNORMAL LOW (ref 150–400)
RBC: 3.66 MIL/uL — ABNORMAL LOW (ref 3.87–5.11)
RDW: 15.4 % (ref 11.5–15.5)
WBC: 8.4 10*3/uL (ref 4.0–10.5)
nRBC: 0 % (ref 0.0–0.2)

## 2022-07-17 SURGERY — ESOPHAGOGASTRODUODENOSCOPY (EGD) WITH PROPOFOL
Anesthesia: Monitor Anesthesia Care

## 2022-07-17 NOTE — ED Provider Notes (Signed)
[place] EMERGENCY DEPARTMENT AT [facility] Provider Note   CSN: [id number] Arrival date & time: 07/16/22  2157     History  Chief Complaint  Patient presents with   Urinary Retention    [name] is a 72 y.o. female.  The history is provided by the patient.  Illness Location:  Bladder Quality:  Feels like bladder is full Severity:  Moderate Onset quality:  Gradual Duration:  36 months Timing:  Constant Progression:  Waxing and waning Chronicity:  Chronic Context:  Elderly patient with a pessary Relieved by:  Nothing Worsened by:  Nothing Ineffective treatments:  None Associated symptoms: no fever and no wheezing   Risk factors:  Elderly      Home Medications Prior to Admission medications   Medication Sig Start Date End Date Taking? Authorizing Provider  allopurinol (ZYLOPRIM) 100 MG tablet Take 100 mg by mouth every evening.     [provider]  Cholecalciferol (VITAMIN D) 125 MCG (5000 UT) CAPS Take 5,000 Units by mouth every Wednesday.    [provider]  insulin regular (NOVOLIN R,HUMULIN R) 100 units/mL injection Inject 5-8 Units into the skin 2 (two) times daily before a meal.    [provider]  levothyroxine (SYNTHROID) 137 MCG tablet Take 137 mcg by mouth daily before breakfast. 12/19/18   [provider]  nadolol (CORGARD) 40 MG tablet Take 40 mg by mouth daily. 08/19/20   [provider]  NOVOLIN N RELION 100 UNIT/ML injection Inject 14 Units into the skin 2 (two) times daily. 09/14/19   [provider]  [facility] ULTRA test strip  12/19/18   [provider]  torsemide (DEMADEX) 20 MG tablet Take 20 mg by mouth 2 (two) times daily.    [provider]  Vitamin D, Ergocalciferol, (DRISDOL) 1.25 MG (50000 UT) CAPS capsule Take 50,000 Units by mouth every Saturday. 11/16/18   [provider]      Allergies    Ciprofloxacin, Crestor [rosuvastatin], Lipitor  [atorvastatin], Lisinopril, Losartan potassium, Lovastatin, Statins, Welchol [colesevelam], Zetia [ezetimibe], and Dilaudid [hydromorphone hcl]    Review of Systems   Review of Systems  Constitutional:  Negative for fever.  HENT:  Negative for facial swelling.   Eyes:  Negative for redness.  Respiratory:  Negative for wheezing and stridor.   Genitourinary:  Negative for dysuria and frequency.    Physical Exam Updated Vital Signs BP (!) 117/50 (BP Location: Left Arm)   Pulse 62   Temp 97.8 F (36.6 C) (Oral)   Resp 18   Ht  (1.676 m)   Wt 90.7 kg   SpO2 99%   BMI 32.27 kg/m  Physical Exam Vitals and nursing note reviewed. Exam conducted with a chaperone present.  Constitutional:      General: She is not in acute distress.    Appearance: She is well-developed.  HENT:     Head: Normocephalic and atraumatic.     Nose: Nose normal.  Eyes:     Pupils: Pupils are equal, round, and reactive to light.  Cardiovascular:     Rate and Rhythm: Normal rate and regular rhythm.     Pulses: Normal pulses.     Heart sounds: Normal heart sounds.  Pulmonary:     Effort: Pulmonary effort is normal. No respiratory distress.     Breath sounds: Normal breath sounds.  Abdominal:     General: Bowel sounds are normal. There is no distension.     Palpations:  Abdomen is soft.     Tenderness: There is no abdominal tenderness. There is no guarding or rebound.  Genitourinary:    Vagina: No vaginal discharge.  Musculoskeletal:        General: Normal range of motion.     Cervical back: Neck supple.  Skin:    General: Skin is warm and dry.     Capillary Refill: Capillary refill takes less than 2 seconds.     Findings: No erythema or rash.  Neurological:     General: No focal deficit present.     Deep Tendon Reflexes: Reflexes normal.  Psychiatric:        Mood and Affect: Mood normal.        Behavior: Behavior normal.     ED Results / Procedures / Treatments   Labs (all labs ordered  are listed, but only abnormal results are displayed) Results for orders placed or performed during the hospital encounter of 07/16/22  CBC with Differential  Result Value Ref Range   WBC 8.4 4.0 - 10.5 K/uL   RBC 3.66 (L) 3.87 - 5.11 MIL/uL   Hemoglobin 11.8 (L) 12.0 - 15.0 g/dL   HCT 16.1 09.6 - 04.5 %   MCV 99.5 80.0 - 100.0 fL   MCH 32.2 26.0 - 34.0 pg   MCHC 32.4 30.0 - 36.0 g/dL   RDW 40.9 81.1 - 91.4 %   Platelets 64 (L) 150 - 400 K/uL   nRBC 0.0 0.0 - 0.2 %   Neutrophils Relative % 71 %   Neutro Abs 6.0 1.7 - 7.7 K/uL   Lymphocytes Relative 16 %   Lymphs Abs 1.3 0.7 - 4.0 K/uL   Monocytes Relative 7 %   Monocytes Absolute 0.6 0.1 - 1.0 K/uL   Eosinophils Relative 6 %   Eosinophils Absolute 0.5 0.0 - 0.5 K/uL   Basophils Relative 0 %   Basophils Absolute 0.0 0.0 - 0.1 K/uL   Immature Granulocytes 0 %   Abs Immature Granulocytes 0.03 0.00 - 0.07 K/uL  Urinalysis, Routine w reflex microscopic -Urine, Clean Catch  Result Value Ref Range   Color, Urine YELLOW YELLOW   APPearance CLEAR CLEAR   Specific Gravity, Urine 1.008 1.005 - 1.030   pH 6.0 5.0 - 8.0   Glucose, UA NEGATIVE NEGATIVE mg/dL   Hgb urine dipstick SMALL (A) NEGATIVE   Bilirubin Urine NEGATIVE NEGATIVE   Ketones, ur NEGATIVE NEGATIVE mg/dL   Protein, ur NEGATIVE NEGATIVE mg/dL   Nitrite NEGATIVE NEGATIVE   Leukocytes,Ua TRACE (A) NEGATIVE   RBC / HPF 0-5 0 - 5 RBC/hpf   WBC, UA 0-5 0 - 5 WBC/hpf   Bacteria, UA RARE (A) NONE SEEN   Squamous Epithelial / HPF 0-5 0 - 5 /HPF   Mucus PRESENT   I-stat chem 8, ED (not at [facility], [facility] or [facility])  Result Value Ref Range   Sodium 142 135 - 145 mmol/L   Potassium 3.6 3.5 - 5.1 mmol/L   Chloride 103 98 - 111 mmol/L   BUN 41 (H) 8 - 23 mg/dL   Creatinine, Ser 7.82 (H) 0.44 - 1.00 mg/dL   Glucose, Bld 75 70 - 99 mg/dL   Calcium, Ion 9.56 (L) 1.15 - 1.40 mmol/L   TCO2 29 22 - 32 mmol/L   Hemoglobin 12.2 12.0 - 15.0 g/dL   HCT 21.3 08.6 - 57.8 %   No results  found.  Radiology No results found.  Procedures Procedures    Medications Ordered in  ED Medications - No data to display  ED Course/ Medical Decision Making/ A&P                             Medical Decision Making Patient with symptoms of retention. Has known stage 3 kidney disease   Amount and/or Complexity of Data Reviewed Independent Historian:     Details: Son see above  External Data Reviewed: notes.    Details: Previous notes reviewed  Labs: ordered.    Details: All labs reviewed:  white count is normal 8.4, low hemoglobin 11.8, platelets low 64 (similar to previous). Urine is without infection.  Bladder scan with only 30 cc. Creatinine 1.5 similar to previous normal sodium and potassium   Risk Risk Details: No emergency today, no retention.  No infection.  Labs are at baseline.  This is chronic and patient has been told they cannot do surgery for her due to her kidney disease.  Stable for discharge.  Will refer to urology     Final Clinical Impression(s) / ED Diagnoses Final diagnoses:  Thrombocytopenia  Renal insufficiency  Urinary hesitancy   Return for intractable cough, coughing up blood, fevers > 100.4 unrelieved by medication, shortness of breath, intractable vomiting, chest pain, shortness of breath, weakness, numbness, changes in speech, facial asymmetry, abdominal pain, passing out, Inability to tolerate liquids or food, cough, altered mental status or any concerns. No signs of systemic illness or infection. The patient is nontoxic-appearing on exam and vital signs are within normal limits.  I have reviewed the triage vital signs and the nursing notes. Pertinent labs & imaging results that were available during my care of the patient were reviewed by me and considered in my medical decision making (see chart for details). After history, exam, and medical workup I feel the patient has been appropriately medically screened and is safe for discharge home. Pertinent  diagnoses were discussed with the patient. Patient was given return precautions.  Rx / DC Orders ED Discharge Orders     None         Briony Parveen, MD 07/17/22 0116

## 2022-07-17 NOTE — Anesthesia Preprocedure Evaluation (Signed)
Anesthesia Evaluation    Reviewed: Allergy & Precautions, Patient's Chart, lab work & pertinent test results  Airway       Comment: Small chin, recessed mandible but appears to have been an easy intubation in 2015 for lap [facility]  (+) Edentulous Upper   Pulmonary neg pulmonary ROS          Cardiovascular hypertension, Pt. on home beta blockers and Pt. on medications      Neuro/Psych  Headaches PSYCHIATRIC DISORDERS  Depression       GI/Hepatic ,GERD  Controlled,,(+) Cirrhosis   Esophageal Varices    Last banding 08/2021    Endo/Other  diabetes, Well Controlled, Type 2, Insulin DependentHypothyroidism  a1c 6 Took 3 units of insulin for FS in 180s this AM  Renal/GU CRFRenal disease  negative genitourinary   Musculoskeletal  (+) Arthritis , Osteoarthritis,    Abdominal   Peds  Hematology  (+) Blood dyscrasia, anemia   Anesthesia Other Findings   Reproductive/Obstetrics negative OB ROS                             Anesthesia Physical Anesthesia Plan  ASA: 3  Anesthesia Plan: MAC   Post-op Pain Management: Minimal or no pain anticipated   Induction:   PONV Risk Score and Plan: 2 and Propofol infusion and TIVA  Airway Management Planned: Natural Airway and Simple Face Mask  Additional Equipment: None  Intra-op Plan:   Post-operative Plan:   Informed Consent:   Plan Discussed with: Anesthesiologist  Anesthesia Plan Comments:         Anesthesia Quick Evaluation

## 2022-07-22 ENCOUNTER — Telehealth: Payer: Self-pay

## 2022-07-22 NOTE — Telephone Encounter (Signed)
        Patient  visited [facility] on 07/17/2022  for urinary retention.   Telephone encounter attempt :  1st  A HIPAA compliant voice message was left requesting a return call.  Instructed patient to call back at [phone].   Peace Noyes [facility]  [facility] Population Health Community Resource Care Guide   [email or web address]  ?? [phone]   Website: [email or web address]  [email or web address]

## 2022-07-23 ENCOUNTER — Telehealth: Payer: Self-pay

## 2022-07-23 NOTE — Telephone Encounter (Signed)
        Patient  visited [facility] on 07/17/2022  for Urinary Retention.   Telephone encounter attempt :  2nd  A HIPAA compliant voice message was left requesting a return call.  Instructed patient to call back at [phone].   [name]  [facility] Population Health Community Resource Care Guide   [email or web address]  ?? [phone]   Website: [email or web address]  [email or web address]

## 2022-07-24 ENCOUNTER — Encounter: Payer: Self-pay | Admitting: Obstetrics & Gynecology

## 2022-07-24 ENCOUNTER — Ambulatory Visit (INDEPENDENT_AMBULATORY_CARE_PROVIDER_SITE_OTHER): Payer: Medicare Other | Admitting: Obstetrics & Gynecology

## 2022-07-24 VITALS — BP 109/53 | HR 57 | Ht 66.0 in | Wt 201.0 lb

## 2022-07-24 DIAGNOSIS — N993 Prolapse of vaginal vault after hysterectomy: Secondary | ICD-10-CM

## 2022-07-24 DIAGNOSIS — Z4689 Encounter for fitting and adjustment of other specified devices: Secondary | ICD-10-CM | POA: Diagnosis not present

## 2022-07-24 NOTE — Progress Notes (Signed)
Chief Complaint  Patient presents with   Pessary fell out yesterday    Blood pressure (!) 109/53, pulse (!) 57, height 5\' 6"  (1.676 m), weight 201 lb (91.2 kg).  Val Eagle presents todaybecause her pessary came out spontaneously, we have been having increasing difficulty of late keeping bladder from "sliding" over the top of her Gelhorn and then casuing pessary failure She of course has complete vaginal/bladder prolpase without the pessary She reports no vaginal discharge and no vaginal bleeding   Likert scale(1 not bothersome -5 very bothersome)  :  1  Exam reveals no undue vaginal mucosal pressure of breakdown, no discharge and no vaginal bleeding.  It tried about everything I had ,the donut she would just rotate 90 degrees supine  The largest gelhorn I had, 3 inch, wiggled I put in a #7 Milex ring with support and then her 2 3/4 inch gelhorn "over the top" of that Not a great solution but a solution  Vaginal Epithelial Abnormality Classification System:   0 0    No abnormalities 1    Epithelial erythema 2    Granulation tissue 3    Epithelial break or erosion, 1 cm or less 4    Epithelial break or erosion, 1 cm or greater  The pessary is removed, cleaned and replaced without difficulty.      ICD-10-CM   1. Pessary maintenance< Gelhorn pessary 2 3/4 inch, original fit 8/21  Z46.89     2. 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  Lazaro Arms, MD  07/24/2022 9:51 AM

## 2022-07-27 DIAGNOSIS — E113512 Type 2 diabetes mellitus with proliferative diabetic retinopathy with macular edema, left eye: Secondary | ICD-10-CM | POA: Diagnosis not present

## 2022-07-27 DIAGNOSIS — Z961 Presence of intraocular lens: Secondary | ICD-10-CM | POA: Diagnosis not present

## 2022-07-27 DIAGNOSIS — H17822 Peripheral opacity of cornea, left eye: Secondary | ICD-10-CM | POA: Diagnosis not present

## 2022-07-27 DIAGNOSIS — E113391 Type 2 diabetes mellitus with moderate nonproliferative diabetic retinopathy without macular edema, right eye: Secondary | ICD-10-CM | POA: Diagnosis not present

## 2022-08-18 ENCOUNTER — Ambulatory Visit: Payer: Medicare Other | Admitting: Obstetrics & Gynecology

## 2022-08-18 ENCOUNTER — Encounter: Payer: Self-pay | Admitting: Obstetrics & Gynecology

## 2022-08-18 VITALS — BP 123/64 | HR 63 | Ht 66.0 in

## 2022-08-18 DIAGNOSIS — S3720XA Unspecified injury of bladder, initial encounter: Secondary | ICD-10-CM | POA: Diagnosis not present

## 2022-08-18 DIAGNOSIS — N898 Other specified noninflammatory disorders of vagina: Secondary | ICD-10-CM

## 2022-08-18 MED ORDER — SULFAMETHOXAZOLE-TRIMETHOPRIM 800-160 MG PO TABS
1.0000 | ORAL_TABLET | Freq: Two times a day (BID) | ORAL | 0 refills | Status: DC
Start: 2022-08-18 — End: 2022-08-28

## 2022-08-18 NOTE — Progress Notes (Unsigned)
Chief Complaint  Patient presents with   Pessary Check         Chief Complaint  Patient presents with   Pessary Check      72 y.o. G3P3000 No LMP recorded. Patient has had a hysterectomy. The current method of family planning is {contraception:[id number]}.  Outpatient Encounter Medications as of 08/18/2022  Medication Sig   allopurinol (ZYLOPRIM) 100 MG tablet Take 100 mg by mouth every evening.    Cholecalciferol (VITAMIN D) 125 MCG (5000 UT) CAPS Take 5,000 Units by mouth every Wednesday.   insulin regular (NOVOLIN R,HUMULIN R) 100 units/mL injection Inject 5-8 Units into the skin 2 (two) times daily before a meal.   levothyroxine (SYNTHROID) 137 MCG tablet Take 137 mcg by mouth daily before breakfast.   nadolol (CORGARD) 40 MG tablet Take 40 mg by mouth daily.   NOVOLIN N RELION 100 UNIT/ML injection Inject 14 Units into the skin 2 (two) times daily.   ONETOUCH ULTRA test strip    sulfamethoxazole-trimethoprim (BACTRIM DS) 800-160 MG tablet Take 1 tablet by mouth 2 (two) times daily.   torsemide (DEMADEX) 20 MG tablet Take 20 mg by mouth 2 (two) times daily.   Vitamin D, Ergocalciferol, (DRISDOL) 1.25 MG (50000 UT) CAPS capsule Take 50,000 Units by mouth every Saturday.   No facility-administered encounter medications on file as of 08/18/2022.    Subjective *** Past Medical History:  Diagnosis Date   Alkaline phosphatase elevation    Anemia    ASD (atrial septal defect)    Cataract    both eyes hx of   Chest pain    03-17-2013 last chest pain   Chronic kidney disease    Stage III kidney disease   Cirrhosis (HCC)    Depression    Diabetes mellitus type II    Family history of adverse reaction to anesthesia    Son hard to wake up   Fatigue    GERD (gastroesophageal reflux disease)    Gout    Headache(784.0)    occasional   Hyperlipidemia    Hypertension    Hypothyroidism    Neuropathy    Osteopenia    Peripheral neuropathy    Presence of pessary     Retinopathy     Past Surgical History:  Procedure Laterality Date   APPENDECTOMY     CATARACT EXTRACTION     CERVICAL SPINE SURGERY  2006   CHOLECYSTECTOMY N/A 04/07/2013   Procedure: LAPAROSCOPIC CHOLECYSTECTOMY WITH INTRAOPERATIVE CHOLANGIOGRAM;  Surgeon: Robyne Askew, MD;  Location: [facility];  Service: General;  Laterality: N/A;   COMBINED HYSTERECTOMY VAGINAL / OOPHORECTOMY / A&P REPAIR  1987   Unilateral oophorectomy, h/o uterine prolapse has right ovary   ERCP N/A 04/06/2013   Procedure: ENDOSCOPIC RETROGRADE CHOLANGIOPANCREATOGRAPHY (ERCP);  Surgeon: Theda Belfast, MD;  Location: [facility];  Service: Endoscopy;  Laterality: N/A;   ESOPHAGEAL BANDING N/A 09/26/2021   Procedure: ESOPHAGEAL BANDING;  Surgeon: Jeani Hawking, MD;  Location: [facility];  Service: Gastroenterology;  Laterality: N/A;   ESOPHAGEAL BANDING N/A 10/31/2021   Procedure: ESOPHAGEAL BANDING;  Surgeon: Jeani Hawking, MD;  Location: [facility];  Service: Gastroenterology;  Laterality: N/A;   ESOPHAGOGASTRODUODENOSCOPY (EGD) WITH PROPOFOL N/A 08/16/2020   Procedure: ESOPHAGOGASTRODUODENOSCOPY (EGD) WITH PROPOFOL;  Surgeon: Jeani Hawking, MD;  Location: [facility];  Service: Endoscopy;  Laterality: N/A;   ESOPHAGOGASTRODUODENOSCOPY (EGD) WITH PROPOFOL N/A 09/26/2021   Procedure: ESOPHAGOGASTRODUODENOSCOPY (EGD) WITH PROPOFOL;  Surgeon: Jeani Hawking, MD;  Location: [facility];  Service: Gastroenterology;  Laterality: N/A;   ESOPHAGOGASTRODUODENOSCOPY (EGD) WITH PROPOFOL N/A 10/31/2021   Procedure: ESOPHAGOGASTRODUODENOSCOPY (EGD) WITH PROPOFOL;  Surgeon: Jeani Hawking, MD;  Location: [facility];  Service: Gastroenterology;  Laterality: N/A;   EUS N/A 03/31/2013   Procedure: UPPER ENDOSCOPIC ULTRASOUND (EUS) LINEAR;  Surgeon: Theda Belfast, MD;  Location: [facility];  Service: Endoscopy;  Laterality: N/A;   REFRACTIVE SURGERY     SPINAL FUSION     c4-c7   TONSILLECTOMY  age 61    OB History      Gravida  3   Para  3   Term  3   Preterm      AB      Living         SAB      IAB      Ectopic      Multiple      Live Births              Allergies  Allergen Reactions   Ciprofloxacin      severe yeast infection   Crestor [Rosuvastatin]     arthralgia/myalgia   Lipitor [Atorvastatin]     arthralgia/myalgia   Lisinopril Cough   Losartan Potassium Cough   Lovastatin     arthralgia/myalgia   Statins Other (See Comments)    arthralgia/myalgia   Welchol [Colesevelam]     arthralgia/myalgia   Zetia [Ezetimibe]     arthralgia/myalgia   Dilaudid [Hydromorphone Hcl] Nausea And Vomiting    After one vomiting episode no further vomiting    Social History   Socioeconomic History   Marital status: Divorced    Spouse name: Not on file   Number of children: 3   Years of education: 12   Highest education level: Not on file  Occupational History   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 Reports that her son recently moved out after living with her for a long time. She reports that she is happy to be living alone and feels like she was enabling his behavior. Reports that he had a history of drug use.   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    No pets.   Eats all food groups.    Wears seat belt.    Lives in house.    Smoke detectors.    Social Determinants of Health   Financial Resource Strain: Medium Risk (10/30/2019)   Overall Financial Resource Strain (CARDIA)    Difficulty of Paying Living Expenses: Somewhat hard  Food  Insecurity: No Food Insecurity (10/30/2019)   Hunger Vital Sign    Worried About Running Out of Food in the Last Year: Never true    Ran Out of Food in the Last Year: Never true  Transportation Needs: No Transportation Needs (10/30/2019)   PRAPARE - Administrator, Civil Service (Medical): No    Lack of Transportation (Non-Medical): No  Physical Activity: Inactive (10/30/2019)   Exercise  Vital Sign    Days of Exercise per Week: 0 days    Minutes of Exercise per Session: 0 min  Stress: No Stress Concern Present (10/30/2019)   [employer] of Occupational Health - Occupational Stress Questionnaire    Feeling of Stress : Not at all  Social Connections: Moderately Integrated (10/30/2019)   Social Connection and Isolation Panel [NHANES]    Frequency of Communication with Friends and Family: More than three times a week    Frequency of Social Gatherings with Friends and Family: Three times a week    Attends Religious Services: More than 4 times per year    Active Member of Clubs or Organizations: Yes    Attends Banker Meetings: 1 to 4 times per year    Marital Status: Divorced    Family History  Problem Relation Age of Onset   COPD Mother    Lung cancer Father    Diabetes Sister    Cataracts Sister    Insulin resistance Daughter    Insulin resistance Son     Medications:       Current Outpatient Medications:    allopurinol (ZYLOPRIM) 100 MG tablet, Take 100 mg by mouth every evening. , Disp: , Rfl:    Cholecalciferol (VITAMIN D) 125 MCG (5000 UT) CAPS, Take 5,000 Units by mouth every Wednesday., Disp: , Rfl:    insulin regular (NOVOLIN R,HUMULIN R) 100 units/mL injection, Inject 5-8 Units into the skin 2 (two) times daily before a meal., Disp: , Rfl:    levothyroxine (SYNTHROID) 137 MCG tablet, Take 137 mcg by mouth daily before breakfast., Disp: , Rfl:    nadolol (CORGARD) 40 MG tablet, Take 40 mg by mouth daily., Disp: , Rfl:    NOVOLIN N RELION 100 UNIT/ML  injection, Inject 14 Units into the skin 2 (two) times daily., Disp: , Rfl:    ONETOUCH ULTRA test strip, , Disp: , Rfl:    sulfamethoxazole-trimethoprim (BACTRIM DS) 800-160 MG tablet, Take 1 tablet by mouth 2 (two) times daily., Disp: 14 tablet, Rfl: 0   torsemide (DEMADEX) 20 MG tablet, Take 20 mg by mouth 2 (two) times daily., Disp: , Rfl:    Vitamin D, Ergocalciferol, (DRISDOL) 1.25 MG (50000 UT) CAPS capsule, Take 50,000 Units by mouth every Saturday., Disp: , Rfl:   Objective Blood pressure 123/64, pulse 63, height 5\' 6"  (1.676 m).  ***  Pertinent ROS ***  Labs or studies ***    Impression + Management Plan: Diagnoses this Encounter::   ICD-10-CM   1. Vaginal erosion  N89.8     2. Bladder injury, closed, initial encounter  S37.20XA         Medications prescribed during  this encounter: Meds ordered this encounter  Medications   sulfamethoxazole-trimethoprim (BACTRIM DS) 800-160 MG tablet    Sig: Take 1 tablet by mouth 2 (two) times daily.    Dispense:  14 tablet    Refill:  0    Labs or Scans Ordered during this encounter: No orders of the defined types were placed in this encounter.     Follow up Return in about 1 week (around 08/25/2022).

## 2022-08-19 ENCOUNTER — Encounter: Payer: Self-pay | Admitting: Obstetrics & Gynecology

## 2022-08-19 ENCOUNTER — Ambulatory Visit (INDEPENDENT_AMBULATORY_CARE_PROVIDER_SITE_OTHER): Payer: Medicare Other | Admitting: Obstetrics & Gynecology

## 2022-08-19 ENCOUNTER — Telehealth: Payer: Self-pay | Admitting: *Deleted

## 2022-08-19 VITALS — BP 119/58 | HR 71

## 2022-08-19 DIAGNOSIS — N898 Other specified noninflammatory disorders of vagina: Secondary | ICD-10-CM

## 2022-08-19 DIAGNOSIS — S3720XA Unspecified injury of bladder, initial encounter: Secondary | ICD-10-CM | POA: Diagnosis not present

## 2022-08-19 NOTE — Telephone Encounter (Signed)
Patient called stating when she got out of bed this morning, she had a gush of liquid that filled her underwear and dripped down her leg.  States her son came this morning to empty her catheter bag and it was about half full.  States the liquid that she noticed was not blood but more clear like urine.    Spoke with Dr Despina Hidden and patient advised to come into the office.  Pt agreeable to come but will need to get ride.

## 2022-08-19 NOTE — Progress Notes (Signed)
Follow up appointment for follow up: Closure of bladder defect  Chief Complaint  Patient presents with   Follow-up    Vaginal erosion    Blood pressure (!) 119/58, pulse 71.  Patient called the office this morning saying she was having some loss of urine from the vagina so I told her to meet me in the office for work and visit  She stated she has had loss a couple of times not a tremendous volume but some and she has been having clear urine in the Foley bag She is not really having any pain or any other concerning no bleeding  On exam the repair that I did yesterday is seen and is found to be intact There are no other areas of erosion or bladder injury that I can determine The Foley catheter is draining although I wonder if it is getting kinked by her defect causing increased volume and then some leakage  Because of the vaginal erosion was called by the stiff rim of the Milex #7 I decided it was reasonable to put the Gellhorn pessary back in and it was definitely not causing the erosion on the vagina which led to the bladder defect because the tissue was so attenuated because of the size of her vaginal prolapse   I made sure that the Gellhorn was comfortable and it was not putting any pressure own the area of injury  I am scheduled to see the patient back next Tuesday but will call her tomorrow to see how she is doing I do not think any other surgery or intervention is indicated at this time The vaginal erosions need to heal which hopefully will be a couple weeks and then we can decide what actions to take at that time The patient understands that there is probably a larger repair of the bladder and vagina needs to be done because of the high likelihood of a vesicovaginal fistula from this type of injury, this repair was probably an adequate to prevent that but was done on an interim basis in order to prevent infection while the vaginal tissue heals prior to a definitive repair  MEDS  ordered this encounter: No orders of the defined types were placed in this encounter.   Orders for this encounter: No orders of the defined types were placed in this encounter.   Impression + Management Plan   ICD-10-CM   1. Vaginal erosion, due to the Milex #7 rim, 12-2 o'clcock  N89.8     2. Bladder injury, closed, initial encounter, due to erosion from the rim of Milex ring #7  S37.20XA       Follow Up: Return for keep scheduled.     All questions were answered.  Past Medical History:  Diagnosis Date   Alkaline phosphatase elevation    Anemia    ASD (atrial septal defect)    Cataract    both eyes hx of   Chest pain    03-17-2013 last chest pain   Chronic kidney disease    Stage III kidney disease   Cirrhosis (HCC)    Depression    Diabetes mellitus type II    Family history of adverse reaction to anesthesia    Son hard to wake up   Fatigue    GERD (gastroesophageal reflux disease)    Gout    Headache(784.0)    occasional   Hyperlipidemia    Hypertension    Hypothyroidism    Neuropathy    Osteopenia  Peripheral neuropathy    Presence of pessary    Retinopathy     Past Surgical History:  Procedure Laterality Date   APPENDECTOMY     CATARACT EXTRACTION     CERVICAL SPINE SURGERY  2006   CHOLECYSTECTOMY N/A 04/07/2013   Procedure: LAPAROSCOPIC CHOLECYSTECTOMY WITH INTRAOPERATIVE CHOLANGIOGRAM;  Surgeon: Robyne Askew, MD;  Location: [facility];  Service: General;  Laterality: N/A;   COMBINED HYSTERECTOMY VAGINAL / OOPHORECTOMY / A&P REPAIR  1987   Unilateral oophorectomy, h/o uterine prolapse has right ovary   ERCP N/A 04/06/2013   Procedure: ENDOSCOPIC RETROGRADE CHOLANGIOPANCREATOGRAPHY (ERCP);  Surgeon: Theda Belfast, MD;  Location: [facility];  Service: Endoscopy;  Laterality: N/A;   ESOPHAGEAL BANDING N/A 09/26/2021   Procedure: ESOPHAGEAL BANDING;  Surgeon: Jeani Hawking, MD;  Location: [facility];  Service: Gastroenterology;  Laterality:  N/A;   ESOPHAGEAL BANDING N/A 10/31/2021   Procedure: ESOPHAGEAL BANDING;  Surgeon: Jeani Hawking, MD;  Location: [facility];  Service: Gastroenterology;  Laterality: N/A;   ESOPHAGOGASTRODUODENOSCOPY (EGD) WITH PROPOFOL N/A 08/16/2020   Procedure: ESOPHAGOGASTRODUODENOSCOPY (EGD) WITH PROPOFOL;  Surgeon: Jeani Hawking, MD;  Location: [facility];  Service: Endoscopy;  Laterality: N/A;   ESOPHAGOGASTRODUODENOSCOPY (EGD) WITH PROPOFOL N/A 09/26/2021   Procedure: ESOPHAGOGASTRODUODENOSCOPY (EGD) WITH PROPOFOL;  Surgeon: Jeani Hawking, MD;  Location: [facility];  Service: Gastroenterology;  Laterality: N/A;   ESOPHAGOGASTRODUODENOSCOPY (EGD) WITH PROPOFOL N/A 10/31/2021   Procedure: ESOPHAGOGASTRODUODENOSCOPY (EGD) WITH PROPOFOL;  Surgeon: Jeani Hawking, MD;  Location: [facility];  Service: Gastroenterology;  Laterality: N/A;   EUS N/A 03/31/2013   Procedure: UPPER ENDOSCOPIC ULTRASOUND (EUS) LINEAR;  Surgeon: Theda Belfast, MD;  Location: [facility];  Service: Endoscopy;  Laterality: N/A;   REFRACTIVE SURGERY     SPINAL FUSION     c4-c7   TONSILLECTOMY  age 72    OB History     Gravida  3   Para  3   Term  3   Preterm      AB      Living         SAB      IAB      Ectopic      Multiple      Live Births              Allergies  Allergen Reactions   Ciprofloxacin      severe yeast infection   Crestor [Rosuvastatin]     arthralgia/myalgia   Lipitor [Atorvastatin]     arthralgia/myalgia   Lisinopril Cough   Losartan Potassium Cough   Lovastatin     arthralgia/myalgia   Statins Other (See Comments)    arthralgia/myalgia   Welchol [Colesevelam]     arthralgia/myalgia   Zetia [Ezetimibe]     arthralgia/myalgia   Dilaudid [Hydromorphone Hcl] Nausea And Vomiting    After one vomiting episode no further vomiting    Social History   Socioeconomic History   Marital status: Divorced    Spouse name: Not on file   Number of children: 3   Years of  education: 12   Highest education level: Not on file  Occupational History   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 Reports that her son recently moved out after living with her for a long time. She reports that she is happy to be living alone and feels like she was enabling his behavior. Reports that he had a history of drug use.   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    No pets.   Eats all food groups.    Wears seat belt.    Lives in house.    Smoke detectors.    Social Determinants of Health   Financial Resource Strain: Medium Risk (10/30/2019)   Overall Financial Resource Strain (CARDIA)    Difficulty of Paying Living Expenses: Somewhat hard  Food Insecurity: No Food Insecurity (10/30/2019)   Hunger Vital Sign    Worried About Running Out of Food in the Last Year: Never true    Ran Out of Food in the Last Year: Never true  Transportation Needs: No Transportation Needs (10/30/2019)   PRAPARE - Administrator, Civil Service (Medical): No    Lack of Transportation (Non-Medical): No  Physical Activity: Inactive (10/30/2019)   Exercise Vital Sign    Days of Exercise per Week: 0 days    Minutes of Exercise per Session: 0 min  Stress: No Stress Concern Present (10/30/2019)   [employer] of Occupational Health - Occupational Stress Questionnaire    Feeling of Stress : Not at all  Social Connections: Moderately Integrated (10/30/2019)   Social Connection and Isolation Panel [NHANES]    Frequency of Communication with Friends and Family: More than  three times a week    Frequency of Social Gatherings with Friends and Family: Three times a week    Attends Religious Services: More than 4 times per year    Active Member of Clubs or Organizations: Yes    Attends Banker Meetings: 1 to 4 times per year    Marital Status: Divorced    Family History  Problem Relation Age of Onset   COPD Mother    Lung cancer Father    Diabetes Sister    Cataracts Sister    Insulin resistance Daughter    Insulin resistance Son

## 2022-08-20 ENCOUNTER — Emergency Department (HOSPITAL_COMMUNITY): Payer: Medicare Other

## 2022-08-20 ENCOUNTER — Other Ambulatory Visit: Payer: Self-pay

## 2022-08-20 ENCOUNTER — Emergency Department (HOSPITAL_COMMUNITY)
Admission: EM | Admit: 2022-08-20 | Discharge: 2022-08-21 | Disposition: A | Discharge status: Home / Self Care | Payer: Medicare Other | Attending: Emergency Medicine | Admitting: Emergency Medicine

## 2022-08-20 DIAGNOSIS — E119 Type 2 diabetes mellitus without complications: Secondary | ICD-10-CM | POA: Diagnosis not present

## 2022-08-20 DIAGNOSIS — K6389 Other specified diseases of intestine: Secondary | ICD-10-CM | POA: Diagnosis not present

## 2022-08-20 DIAGNOSIS — R110 Nausea: Secondary | ICD-10-CM | POA: Insufficient documentation

## 2022-08-20 DIAGNOSIS — Z794 Long term (current) use of insulin: Secondary | ICD-10-CM | POA: Insufficient documentation

## 2022-08-20 DIAGNOSIS — R531 Weakness: Secondary | ICD-10-CM | POA: Diagnosis not present

## 2022-08-20 DIAGNOSIS — R509 Fever, unspecified: Secondary | ICD-10-CM | POA: Diagnosis not present

## 2022-08-20 DIAGNOSIS — N3000 Acute cystitis without hematuria: Secondary | ICD-10-CM

## 2022-08-20 DIAGNOSIS — K219 Gastro-esophageal reflux disease without esophagitis: Secondary | ICD-10-CM | POA: Diagnosis not present

## 2022-08-20 DIAGNOSIS — R5381 Other malaise: Secondary | ICD-10-CM | POA: Diagnosis not present

## 2022-08-20 DIAGNOSIS — R109 Unspecified abdominal pain: Secondary | ICD-10-CM | POA: Diagnosis not present

## 2022-08-20 DIAGNOSIS — K7460 Unspecified cirrhosis of liver: Secondary | ICD-10-CM | POA: Diagnosis not present

## 2022-08-20 DIAGNOSIS — Z743 Need for continuous supervision: Secondary | ICD-10-CM | POA: Diagnosis not present

## 2022-08-20 LAB — CBG MONITORING, ED: Glucose-Capillary: 140 mg/dL — ABNORMAL HIGH (ref 70–99)

## 2022-08-20 MED ORDER — SODIUM CHLORIDE 0.9 % IV BOLUS
500.0000 mL | Freq: Once | INTRAVENOUS | Status: AC
  Administered 2022-08-21: 500 mL via INTRAVENOUS

## 2022-08-20 NOTE — ED Notes (Signed)
Patient transported to CT 

## 2022-08-20 NOTE — ED Triage Notes (Signed)
Pt BIB [facility] from home. Pt c/o nausea, abdominal pain, and weakness.

## 2022-08-21 LAB — URINALYSIS, ROUTINE W REFLEX MICROSCOPIC
Bilirubin Urine: NEGATIVE
Glucose, UA: NEGATIVE mg/dL
Hgb urine dipstick: NEGATIVE
Ketones, ur: NEGATIVE mg/dL
Nitrite: NEGATIVE
Protein, ur: NEGATIVE mg/dL
Specific Gravity, Urine: 1.008 (ref 1.005–1.030)
pH: 6 (ref 5.0–8.0)

## 2022-08-21 LAB — COMPREHENSIVE METABOLIC PANEL
ALT: 19 U/L (ref 0–44)
AST: 33 U/L (ref 15–41)
Albumin: 2.4 g/dL — ABNORMAL LOW (ref 3.5–5.0)
Alkaline Phosphatase: 122 U/L (ref 38–126)
Anion gap: 10 (ref 5–15)
BUN: 40 mg/dL — ABNORMAL HIGH (ref 8–23)
CO2: 24 mmol/L (ref 22–32)
Calcium: 7.7 mg/dL — ABNORMAL LOW (ref 8.9–10.3)
Chloride: 103 mmol/L (ref 98–111)
Creatinine, Ser: 2.33 mg/dL — ABNORMAL HIGH (ref 0.44–1.00)
GFR, Estimated: 22 mL/min — ABNORMAL LOW (ref >60)
Glucose, Bld: 123 mg/dL — ABNORMAL HIGH (ref 70–99)
Potassium: 4.2 mmol/L (ref 3.5–5.1)
Sodium: 137 mmol/L (ref 135–145)
Total Bilirubin: 1.7 mg/dL — ABNORMAL HIGH (ref 0.3–1.2)
Total Protein: 5.8 g/dL — ABNORMAL LOW (ref 6.5–8.1)

## 2022-08-21 LAB — CBC WITH DIFFERENTIAL/PLATELET
Abs Immature Granulocytes: 0.03 10*3/uL (ref 0.00–0.07)
Basophils Absolute: 0 10*3/uL (ref 0.0–0.1)
Basophils Relative: 0 %
Eosinophils Absolute: 0.2 10*3/uL (ref 0.0–0.5)
Eosinophils Relative: 2 %
HCT: 34.9 % — ABNORMAL LOW (ref 36.0–46.0)
Hemoglobin: 11.8 g/dL — ABNORMAL LOW (ref 12.0–15.0)
Immature Granulocytes: 0 %
Lymphocytes Relative: 8 %
Lymphs Abs: 0.7 10*3/uL (ref 0.7–4.0)
MCH: 33.3 pg (ref 26.0–34.0)
MCHC: 33.8 g/dL (ref 30.0–36.0)
MCV: 98.6 fL (ref 80.0–100.0)
Monocytes Absolute: 0.6 10*3/uL (ref 0.1–1.0)
Monocytes Relative: 6 %
Neutro Abs: 7.5 10*3/uL (ref 1.7–7.7)
Neutrophils Relative %: 84 %
Platelets: 60 10*3/uL — ABNORMAL LOW (ref 150–400)
RBC: 3.54 MIL/uL — ABNORMAL LOW (ref 3.87–5.11)
RDW: 15.2 % (ref 11.5–15.5)
WBC: 9 10*3/uL (ref 4.0–10.5)
nRBC: 0 % (ref 0.0–0.2)

## 2022-08-21 LAB — TROPONIN I (HIGH SENSITIVITY)
Troponin I (High Sensitivity): 6 ng/L (ref <18)
Troponin I (High Sensitivity): 6 ng/L (ref <18)

## 2022-08-21 LAB — LIPASE, BLOOD: Lipase: 20 U/L (ref 11–51)

## 2022-08-21 MED ORDER — SODIUM CHLORIDE 0.9 % IV SOLN
1.0000 g | Freq: Once | INTRAVENOUS | Status: AC
  Administered 2022-08-21: 1 g via INTRAVENOUS
  Filled 2022-08-21: qty 10

## 2022-08-21 MED ORDER — SODIUM CHLORIDE 0.9 % IV BOLUS
500.0000 mL | Freq: Once | INTRAVENOUS | Status: AC
  Administered 2022-08-21: 500 mL via INTRAVENOUS

## 2022-08-21 NOTE — Discharge Instructions (Signed)
Continue taking Bactrim as previously prescribed.  Follow-up with your GYN in the next few days and return to the ER if symptoms significantly worsen or change.

## 2022-08-21 NOTE — ED Provider Notes (Signed)
[place] EMERGENCY DEPARTMENT AT [facility] Provider Note   CSN: [id number] Arrival date & time: 08/20/22  2319     History  Chief Complaint  Patient presents with   Weakness    [name] is a 72 y.o. female.  Patient is a 72 year old female with past medical history of type 2 diabetes, hyperlipidemia, GERD, or chronic renal insufficiency.  Patient presenting today with complaints of nausea, weakness, and abdominal discomfort.  This has been worsening over the past 2 days.  Patient denies any fevers or chills.  She denies diarrhea or vomiting.  No aggravating or alleviating factors.  The history is provided by the patient.       Home Medications Prior to Admission medications   Medication Sig Start Date End Date Taking? Authorizing Provider  allopurinol (ZYLOPRIM) 100 MG tablet Take 100 mg by mouth every evening.     [provider]  Cholecalciferol (VITAMIN D) 125 MCG (5000 UT) CAPS Take 5,000 Units by mouth every Wednesday.    [provider]  insulin regular (NOVOLIN R,HUMULIN R) 100 units/mL injection Inject 5-8 Units into the skin 2 (two) times daily before a meal.    [provider]  levothyroxine (SYNTHROID) 137 MCG tablet Take 137 mcg by mouth daily before breakfast. 12/19/18   [provider]  nadolol (CORGARD) 40 MG tablet Take 40 mg by mouth daily. 08/19/20   [provider]  NOVOLIN N RELION 100 UNIT/ML injection Inject 14 Units into the skin 2 (two) times daily. 09/14/19   [provider]  [facility] ULTRA test strip  12/19/18   [provider]  sulfamethoxazole-trimethoprim (BACTRIM DS) 800-160 MG tablet Take 1 tablet by mouth 2 (two) times daily. 08/18/22   Lazaro Arms, MD  torsemide (DEMADEX) 20 MG tablet Take 20 mg by mouth 2 (two) times daily.    [provider]  Vitamin D, Ergocalciferol, (DRISDOL) 1.25 MG (50000 UT) CAPS capsule Take 50,000 Units by mouth every Saturday.  11/16/18   [provider]      Allergies    Ciprofloxacin, Crestor [rosuvastatin], Lipitor [atorvastatin], Lisinopril, Losartan potassium, Lovastatin, Statins, Welchol [colesevelam], Zetia [ezetimibe], and Dilaudid [hydromorphone hcl]    Review of Systems   Review of Systems  All other systems reviewed and are negative.   Physical Exam Updated Vital Signs BP (!) 100/45   Pulse 74   Temp 98.4 F (36.9 C)   Resp 19   Ht 5\' 6"  (1.676 m)   Wt 92 kg   SpO2 95%   BMI 32.74 kg/m  Physical Exam Vitals and nursing note reviewed.  Constitutional:      General: She is not in acute distress.    Appearance: She is well-developed. She is not diaphoretic.  HENT:     Head: Normocephalic and atraumatic.  Cardiovascular:     Rate and Rhythm: Normal rate and regular rhythm.     Heart sounds: No murmur heard.    No friction rub. No gallop.  Pulmonary:     Effort: Pulmonary effort is normal. No respiratory distress.     Breath sounds: Normal breath sounds. No wheezing.  Abdominal:     General: Bowel sounds are normal. There is no distension.     Palpations: Abdomen is soft.     Tenderness: There is abdominal tenderness. There is no right CVA tenderness, left CVA tenderness, guarding or rebound.  Musculoskeletal:        General: Normal range of motion.  Cervical back: Normal range of motion and neck supple.  Skin:    General: Skin is warm and dry.  Neurological:     General: No focal deficit present.     Mental Status: She is alert and oriented to person, place, and time.     ED Results / Procedures / Treatments   Labs (all labs ordered are listed, but only abnormal results are displayed) Labs Reviewed  COMPREHENSIVE METABOLIC PANEL - Abnormal; Notable for the following components:      Result Value   Glucose, Bld 123 (*)    BUN 40 (*)    Creatinine, Ser 2.33 (*)    Calcium 7.7 (*)    Total Protein 5.8 (*)    Albumin 2.4 (*)    Total Bilirubin 1.7 (*)    GFR,  Estimated 22 (*)    All other components within normal limits  CBC WITH DIFFERENTIAL/PLATELET - Abnormal; Notable for the following components:   RBC 3.54 (*)    Hemoglobin 11.8 (*)    HCT 34.9 (*)    Platelets 60 (*)    All other components within normal limits  URINALYSIS, ROUTINE W REFLEX MICROSCOPIC - Abnormal; Notable for the following components:   Leukocytes,Ua MODERATE (*)    Bacteria, UA RARE (*)    All other components within normal limits  CBG MONITORING, ED - Abnormal; Notable for the following components:   Glucose-Capillary 140 (*)    All other components within normal limits  LIPASE, BLOOD  TROPONIN I (HIGH SENSITIVITY)    EKG EKG Interpretation  Date/Time:  Thursday Aug 20 2022 23:30:39 EDT Ventricular Rate:  72 PR Interval:  167 QRS Duration: 140 QT Interval:  455 QTC Calculation: 498 R Axis:   -43 Text Interpretation: Sinus rhythm IVCD, consider atypical RBBB LVH with IVCD and secondary repol abnrm Borderline prolonged QT interval No significant change since 07/16/2020 Confirmed by Geoffery Lyons ([id number]) on 08/20/2022 11:40:32 PM  Radiology CT Renal Stone Study  Result Date: 08/21/2022 CLINICAL DATA:  Nausea, right-sided abdominal pain, and weakness EXAM: CT ABDOMEN AND PELVIS WITHOUT CONTRAST TECHNIQUE: Multidetector CT imaging of the abdomen and pelvis was performed following the standard protocol without IV contrast. RADIATION DOSE REDUCTION: This exam was performed according to the departmental dose-optimization program which includes automated exposure control, adjustment of the mA and/or kV according to patient size and/or use of iterative reconstruction technique. COMPARISON:  CT urogram 09/06/2019 and ultrasound 12/18/2020 FINDINGS: Lower chest: Left basilar scarring.  No acute abnormality. Hepatobiliary: Cholecystectomy. Prominent bile ducts likely due to reservoir effect. Nodular hepatic contour consistent with cirrhosis. Pancreas: Atrophic. Coarse  calcifications in the pancreatic body and head. No acute abnormality. Spleen: Borderline splenomegaly measuring 13.3 cm in craniocaudal dimension. Adrenals/Urinary Tract: Stable adrenal glands. No urinary calculi or hydronephrosis. Foley catheter in the nondistended bladder. Stomach/Bowel: Normal caliber large and small bowel. Colonic diverticulosis without diverticulitis. 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 Stomach is within normal limits. The appendix is not visualized. Vascular/Lymphatic: Aortic atherosclerosis. Upper abdominal varices. No enlarged abdominal or pelvic lymph nodes. Reproductive: Status post hysterectomy. No adnexal masses.  Pessary. Other: Diffuse mesenteric edema. Small volume abdominopelvic ascites which is low-density. Musculoskeletal: Demineralization. No acute osseous abnormality. Chronic superior endplate compression of L4 IMPRESSION: 1. 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 2. 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 3. Small volume abdominopelvic ascites and diffuse mesenteric edema.  4. Sequela of chronic pancreatitis. Aortic Atherosclerosis (ICD10-I70.0). Electronically Signed   By: Minerva Fester M.D.   On: 08/21/2022 00:05    Procedures Procedures    Medications Ordered in ED Medications  sodium chloride 0.9 % bolus 500 mL (500 mLs Intravenous New Bag/Given 08/21/22 0013)    ED Course/ Medical Decision Making/ A&P  Patient is a 72 year old female with past medical history as per HPI.  Patient presenting with nausea, weakness, and abdominal pain worsening over the past 2 days.  Patient arrives here with stable vital signs and is afebrile.  Physical examination is basically unremarkable.  Workup initiated including CBC, CMP, lipase, and troponin.  She has a mild elevation of her creatinine above her baseline, but laboratory  studies otherwise unremarkable.  There is no leukocytosis and liver and pancreatic enzymes are basically normal.  Urinalysis is concerning for UTI.  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  Patient was given IV Rocephin for the UTI.  She also recently had a Foley catheter placed by her GYN and patient reports that the urine is draining around the catheter.  For this reason, the catheter was replaced and this seems to fix the problem.  She was prescribed Bactrim by her GYN and I will have her continue taking this.  She is to follow-up as needed.  Final Clinical Impression(s) / ED Diagnoses Final diagnoses:  None    Rx / DC Orders ED Discharge Orders     None         Geoffery Lyons, MD 08/21/22 [phone]

## 2022-08-25 ENCOUNTER — Inpatient Hospital Stay (HOSPITAL_COMMUNITY)
Admission: EM | Admit: 2022-08-25 | Discharge: 2022-08-28 | DRG: 684 | Disposition: A | Discharge status: Skilled Nursing Facility | Payer: Medicare Other | Attending: Internal Medicine | Admitting: Internal Medicine

## 2022-08-25 ENCOUNTER — Emergency Department (HOSPITAL_COMMUNITY): Payer: Medicare Other

## 2022-08-25 ENCOUNTER — Ambulatory Visit: Payer: Medicare Other | Admitting: Obstetrics & Gynecology

## 2022-08-25 ENCOUNTER — Other Ambulatory Visit: Payer: Self-pay

## 2022-08-25 ENCOUNTER — Encounter (HOSPITAL_COMMUNITY): Payer: Self-pay | Admitting: *Deleted

## 2022-08-25 DIAGNOSIS — K219 Gastro-esophageal reflux disease without esophagitis: Secondary | ICD-10-CM | POA: Diagnosis present

## 2022-08-25 DIAGNOSIS — D696 Thrombocytopenia, unspecified: Secondary | ICD-10-CM | POA: Diagnosis present

## 2022-08-25 DIAGNOSIS — S3991XA Unspecified injury of abdomen, initial encounter: Secondary | ICD-10-CM | POA: Diagnosis not present

## 2022-08-25 DIAGNOSIS — I129 Hypertensive chronic kidney disease with stage 1 through stage 4 chronic kidney disease, or unspecified chronic kidney disease: Secondary | ICD-10-CM | POA: Diagnosis not present

## 2022-08-25 DIAGNOSIS — N1831 Chronic kidney disease, stage 3a: Secondary | ICD-10-CM

## 2022-08-25 DIAGNOSIS — Z6832 Body mass index (BMI) 32.0-32.9, adult: Secondary | ICD-10-CM

## 2022-08-25 DIAGNOSIS — N184 Chronic kidney disease, stage 4 (severe): Secondary | ICD-10-CM | POA: Diagnosis not present

## 2022-08-25 DIAGNOSIS — R9431 Abnormal electrocardiogram [ECG] [EKG]: Secondary | ICD-10-CM | POA: Diagnosis not present

## 2022-08-25 DIAGNOSIS — M25561 Pain in right knee: Secondary | ICD-10-CM | POA: Diagnosis not present

## 2022-08-25 DIAGNOSIS — R110 Nausea: Secondary | ICD-10-CM | POA: Diagnosis not present

## 2022-08-25 DIAGNOSIS — Z833 Family history of diabetes mellitus: Secondary | ICD-10-CM

## 2022-08-25 DIAGNOSIS — R279 Unspecified lack of coordination: Secondary | ICD-10-CM | POA: Diagnosis not present

## 2022-08-25 DIAGNOSIS — D631 Anemia in chronic kidney disease: Secondary | ICD-10-CM | POA: Diagnosis present

## 2022-08-25 DIAGNOSIS — Z825 Family history of asthma and other chronic lower respiratory diseases: Secondary | ICD-10-CM | POA: Diagnosis not present

## 2022-08-25 DIAGNOSIS — Z801 Family history of malignant neoplasm of trachea, bronchus and lung: Secondary | ICD-10-CM

## 2022-08-25 DIAGNOSIS — Z981 Arthrodesis status: Secondary | ICD-10-CM | POA: Diagnosis not present

## 2022-08-25 DIAGNOSIS — R3129 Other microscopic hematuria: Secondary | ICD-10-CM | POA: Diagnosis present

## 2022-08-25 DIAGNOSIS — N189 Chronic kidney disease, unspecified: Secondary | ICD-10-CM | POA: Diagnosis not present

## 2022-08-25 DIAGNOSIS — E1142 Type 2 diabetes mellitus with diabetic polyneuropathy: Secondary | ICD-10-CM | POA: Diagnosis present

## 2022-08-25 DIAGNOSIS — N179 Acute kidney failure, unspecified: Principal | ICD-10-CM | POA: Diagnosis present

## 2022-08-25 DIAGNOSIS — E1122 Type 2 diabetes mellitus with diabetic chronic kidney disease: Secondary | ICD-10-CM | POA: Diagnosis not present

## 2022-08-25 DIAGNOSIS — T370X5A Adverse effect of sulfonamides, initial encounter: Secondary | ICD-10-CM

## 2022-08-25 DIAGNOSIS — S299XXA Unspecified injury of thorax, initial encounter: Secondary | ICD-10-CM | POA: Diagnosis not present

## 2022-08-25 DIAGNOSIS — N993 Prolapse of vaginal vault after hysterectomy: Secondary | ICD-10-CM | POA: Diagnosis present

## 2022-08-25 DIAGNOSIS — E669 Obesity, unspecified: Secondary | ICD-10-CM | POA: Diagnosis present

## 2022-08-25 DIAGNOSIS — Z79890 Hormone replacement therapy (postmenopausal): Secondary | ICD-10-CM

## 2022-08-25 DIAGNOSIS — Z79899 Other long term (current) drug therapy: Secondary | ICD-10-CM

## 2022-08-25 DIAGNOSIS — R2681 Unsteadiness on feet: Secondary | ICD-10-CM | POA: Diagnosis not present

## 2022-08-25 DIAGNOSIS — Z743 Need for continuous supervision: Secondary | ICD-10-CM | POA: Diagnosis not present

## 2022-08-25 DIAGNOSIS — Z881 Allergy status to other antibiotic agents status: Secondary | ICD-10-CM | POA: Diagnosis not present

## 2022-08-25 DIAGNOSIS — E11319 Type 2 diabetes mellitus with unspecified diabetic retinopathy without macular edema: Secondary | ICD-10-CM | POA: Diagnosis not present

## 2022-08-25 DIAGNOSIS — N8110 Cystocele, unspecified: Secondary | ICD-10-CM | POA: Diagnosis present

## 2022-08-25 DIAGNOSIS — Z885 Allergy status to narcotic agent status: Secondary | ICD-10-CM | POA: Diagnosis not present

## 2022-08-25 DIAGNOSIS — M109 Gout, unspecified: Secondary | ICD-10-CM | POA: Diagnosis present

## 2022-08-25 DIAGNOSIS — Z888 Allergy status to other drugs, medicaments and biological substances status: Secondary | ICD-10-CM | POA: Diagnosis not present

## 2022-08-25 DIAGNOSIS — K7460 Unspecified cirrhosis of liver: Secondary | ICD-10-CM | POA: Diagnosis present

## 2022-08-25 DIAGNOSIS — Z0389 Encounter for observation for other suspected diseases and conditions ruled out: Secondary | ICD-10-CM | POA: Diagnosis not present

## 2022-08-25 DIAGNOSIS — R531 Weakness: Secondary | ICD-10-CM | POA: Diagnosis not present

## 2022-08-25 DIAGNOSIS — E039 Hypothyroidism, unspecified: Secondary | ICD-10-CM | POA: Diagnosis not present

## 2022-08-25 DIAGNOSIS — E785 Hyperlipidemia, unspecified: Secondary | ICD-10-CM | POA: Diagnosis not present

## 2022-08-25 DIAGNOSIS — R2689 Other abnormalities of gait and mobility: Secondary | ICD-10-CM | POA: Diagnosis not present

## 2022-08-25 DIAGNOSIS — Z794 Long term (current) use of insulin: Secondary | ICD-10-CM

## 2022-08-25 DIAGNOSIS — E860 Dehydration: Secondary | ICD-10-CM | POA: Diagnosis not present

## 2022-08-25 DIAGNOSIS — K7581 Nonalcoholic steatohepatitis (NASH): Secondary | ICD-10-CM

## 2022-08-25 DIAGNOSIS — S3993XA Unspecified injury of pelvis, initial encounter: Secondary | ICD-10-CM | POA: Diagnosis not present

## 2022-08-25 DIAGNOSIS — M6281 Muscle weakness (generalized): Secondary | ICD-10-CM | POA: Diagnosis not present

## 2022-08-25 DIAGNOSIS — W19XXXA Unspecified fall, initial encounter: Secondary | ICD-10-CM | POA: Diagnosis not present

## 2022-08-25 DIAGNOSIS — I10 Essential (primary) hypertension: Secondary | ICD-10-CM | POA: Diagnosis not present

## 2022-08-25 DIAGNOSIS — S0990XA Unspecified injury of head, initial encounter: Secondary | ICD-10-CM | POA: Diagnosis not present

## 2022-08-25 DIAGNOSIS — R1319 Other dysphagia: Secondary | ICD-10-CM | POA: Diagnosis not present

## 2022-08-25 LAB — COMPREHENSIVE METABOLIC PANEL
ALT: 16 U/L (ref 0–44)
AST: 30 U/L (ref 15–41)
Albumin: 2.2 g/dL — ABNORMAL LOW (ref 3.5–5.0)
Alkaline Phosphatase: 102 U/L (ref 38–126)
Anion gap: 12 (ref 5–15)
BUN: 54 mg/dL — ABNORMAL HIGH (ref 8–23)
CO2: 18 mmol/L — ABNORMAL LOW (ref 22–32)
Calcium: 7.6 mg/dL — ABNORMAL LOW (ref 8.9–10.3)
Chloride: 104 mmol/L (ref 98–111)
Creatinine, Ser: 2.82 mg/dL — ABNORMAL HIGH (ref 0.44–1.00)
GFR, Estimated: 17 mL/min — ABNORMAL LOW (ref >60)
Glucose, Bld: 146 mg/dL — ABNORMAL HIGH (ref 70–99)
Potassium: 3.8 mmol/L (ref 3.5–5.1)
Sodium: 134 mmol/L — ABNORMAL LOW (ref 135–145)
Total Bilirubin: 0.8 mg/dL (ref 0.3–1.2)
Total Protein: 5.2 g/dL — ABNORMAL LOW (ref 6.5–8.1)

## 2022-08-25 LAB — CBC WITH DIFFERENTIAL/PLATELET
Abs Immature Granulocytes: 0.02 10*3/uL (ref 0.00–0.07)
Basophils Absolute: 0 10*3/uL (ref 0.0–0.1)
Basophils Relative: 1 %
Eosinophils Absolute: 0.2 10*3/uL (ref 0.0–0.5)
Eosinophils Relative: 4 %
HCT: 31.7 % — ABNORMAL LOW (ref 36.0–46.0)
Hemoglobin: 10.6 g/dL — ABNORMAL LOW (ref 12.0–15.0)
Immature Granulocytes: 0 %
Lymphocytes Relative: 13 %
Lymphs Abs: 0.7 10*3/uL (ref 0.7–4.0)
MCH: 32.7 pg (ref 26.0–34.0)
MCHC: 33.4 g/dL (ref 30.0–36.0)
MCV: 97.8 fL (ref 80.0–100.0)
Monocytes Absolute: 0.3 10*3/uL (ref 0.1–1.0)
Monocytes Relative: 5 %
Neutro Abs: 4.1 10*3/uL (ref 1.7–7.7)
Neutrophils Relative %: 77 %
Platelets: 48 10*3/uL — ABNORMAL LOW (ref 150–400)
RBC: 3.24 MIL/uL — ABNORMAL LOW (ref 3.87–5.11)
RDW: 15.2 % (ref 11.5–15.5)
WBC: 5.4 10*3/uL (ref 4.0–10.5)
nRBC: 0 % (ref 0.0–0.2)

## 2022-08-25 LAB — URINALYSIS, W/ REFLEX TO CULTURE (INFECTION SUSPECTED)
Bacteria, UA: NONE SEEN
Bilirubin Urine: NEGATIVE
Glucose, UA: NEGATIVE mg/dL
Ketones, ur: NEGATIVE mg/dL
Nitrite: NEGATIVE
Protein, ur: 100 mg/dL — AB
RBC / HPF: >50 RBC/hpf (ref 0–5)
Specific Gravity, Urine: 1.009 (ref 1.005–1.030)
pH: 6 (ref 5.0–8.0)

## 2022-08-25 LAB — TROPONIN I (HIGH SENSITIVITY)
Troponin I (High Sensitivity): 5 ng/L (ref <18)
Troponin I (High Sensitivity): 5 ng/L (ref <18)

## 2022-08-25 LAB — MAGNESIUM: Magnesium: 2.3 mg/dL (ref 1.7–2.4)

## 2022-08-25 LAB — GLUCOSE, CAPILLARY
Glucose-Capillary: 158 mg/dL — ABNORMAL HIGH (ref 70–99)
Glucose-Capillary: 176 mg/dL — ABNORMAL HIGH (ref 70–99)

## 2022-08-25 LAB — LACTIC ACID, PLASMA
Lactic Acid, Venous: 1.7 mmol/L (ref 0.5–1.9)
Lactic Acid, Venous: 1.5 mmol/L (ref 0.5–1.9)

## 2022-08-25 LAB — CULTURE, BLOOD (ROUTINE X 2)

## 2022-08-25 LAB — TSH: TSH: 0.112 u[IU]/mL — ABNORMAL LOW (ref 0.350–4.500)

## 2022-08-25 LAB — T4, FREE: Free T4: 1.8 ng/dL — ABNORMAL HIGH (ref 0.61–1.12)

## 2022-08-25 MED ORDER — ACETAMINOPHEN 325 MG PO TABS: 650.0000 mg | ORAL_TABLET | Freq: Four times a day (QID) | ORAL | Status: DC | PRN

## 2022-08-25 MED ORDER — INSULIN ASPART 100 UNIT/ML IJ SOLN: 0.0000 [IU] | Freq: Every day | INTRAMUSCULAR | Status: DC

## 2022-08-25 MED ORDER — SODIUM CHLORIDE 0.9 % IV SOLN
2.0000 g | Freq: Once | INTRAVENOUS | Status: AC
  Administered 2022-08-25: 2 g via INTRAVENOUS
  Filled 2022-08-25: qty 12.5

## 2022-08-25 MED ORDER — SODIUM CHLORIDE 0.9 % IV SOLN: INTRAVENOUS | Status: AC

## 2022-08-25 MED ORDER — NADOLOL 40 MG PO TABS
40.0000 mg | ORAL_TABLET | Freq: Every day | ORAL | Status: DC
  Filled 2022-08-25 (×3): qty 1

## 2022-08-25 MED ORDER — NYSTATIN 100000 UNIT/GM EX POWD
Freq: Three times a day (TID) | CUTANEOUS | Status: DC
  Filled 2022-08-25 (×3): qty 15

## 2022-08-25 MED ORDER — ACETAMINOPHEN 650 MG RE SUPP: 650.0000 mg | Freq: Four times a day (QID) | RECTAL | Status: DC | PRN

## 2022-08-25 MED ORDER — ALLOPURINOL 100 MG PO TABS
100.0000 mg | ORAL_TABLET | Freq: Every evening | ORAL | Status: DC
  Administered 2022-08-25 – 2022-08-27 (×3): 100 mg via ORAL
  Filled 2022-08-25 (×3): qty 1

## 2022-08-25 MED ORDER — PROMETHAZINE HCL 12.5 MG PO TABS: 12.5000 mg | ORAL_TABLET | Freq: Four times a day (QID) | ORAL | Status: DC | PRN

## 2022-08-25 MED ORDER — POLYETHYLENE GLYCOL 3350 17 G PO PACK
17.0000 g | PACK | Freq: Every day | ORAL | Status: DC | PRN
  Administered 2022-08-27: 17 g via ORAL
  Filled 2022-08-25: qty 1

## 2022-08-25 MED ORDER — INSULIN ASPART 100 UNIT/ML IJ SOLN
0.0000 [IU] | Freq: Three times a day (TID) | INTRAMUSCULAR | Status: DC
  Administered 2022-08-26: 2 [IU] via SUBCUTANEOUS
  Administered 2022-08-26 (×2): 1 [IU] via SUBCUTANEOUS
  Administered 2022-08-27 (×3): 2 [IU] via SUBCUTANEOUS
  Administered 2022-08-28: 1 [IU] via SUBCUTANEOUS

## 2022-08-25 MED ORDER — LACTATED RINGERS IV BOLUS (SEPSIS)
500.0000 mL | Freq: Once | INTRAVENOUS | Status: AC
  Administered 2022-08-25: 500 mL via INTRAVENOUS

## 2022-08-25 NOTE — Assessment & Plan Note (Signed)
TSH low at 0.112.  She is on Synthroid -Considering low TSH, may need to discontinue Synthroid -Follow-up T3, T4 ordered in ED

## 2022-08-25 NOTE — Assessment & Plan Note (Signed)
Stable. -Resume nadolol, torsemide in a.m.

## 2022-08-25 NOTE — Assessment & Plan Note (Signed)
Qt- 513.  QTc prolonged, not new.  K- 3.8. - Check Mag.

## 2022-08-25 NOTE — Assessment & Plan Note (Addendum)
Creatinine elevated at 2.8, baseline 1.2-1.5.  CKD stage III A-B.  In the setting of poor oral intake, Bactrim., torsemide 20mg  BID. - bolus given continue N/s 100cc/hr x 15hrs -Was to complete her last day of Bactrim today.  She has been given cefepime in the ED.   -UA with trace leukocytes, improved from prior UA, no fever no leukocytosis, she has had pelvic pain since pessaries were put in, hold off on antibiotics at this time.  No recent urine cultures on file.  -Follow-up blood cultures ordered in the ED.

## 2022-08-25 NOTE — Assessment & Plan Note (Addendum)
She is on NovoLin  14u BID - SSI- S - HgbA1c

## 2022-08-25 NOTE — Assessment & Plan Note (Signed)
Stable.  With thrombocytopenia

## 2022-08-25 NOTE — ED Notes (Signed)
Patient transported to X-ray 

## 2022-08-25 NOTE — ED Provider Notes (Signed)
[place] EMERGENCY DEPARTMENT AT [facility] Provider Note   CSN: [id number] Arrival date & time: 08/25/22  1040     History  Chief Complaint  Patient presents with   Weakness    [name] is a 71 y.o. female.  HPI Patient presents for generalized weakness.  Medical history includes HLD, HTN, GERD, cirrhosis, T2DM, CKD, neuropathy, gout, depression.  She was seen in the ED 5 days ago.  At that time, she was diagnosed with UTI.  Foley catheter (recently placed by her GYN) was replaced.  She was prescribed Bactrim.  Since prior ED visit, she has had ongoing generalized weakness, nausea, and poor p.o. intake.  She did have vomiting today.  She has had recent diarrhea.  She reports acute on chronic dizziness over the past week.  Her dizziness and weakness today led to a fall.  During her fall, she struck her right knee.  She has had some mild pain to this knee since the fall.  She was able to stand and ambulate with assistance with EMS.  EMS noted some blood pressures in the range of 100/60.  She was given Zofran prior to arrival.  She does endorse persistent nausea.    Home Medications Prior to Admission medications   Medication Sig Start Date End Date Taking? Authorizing Provider  allopurinol (ZYLOPRIM) 100 MG tablet Take 100 mg by mouth every evening.    Yes [provider]  Cholecalciferol (VITAMIN D-3) 125 MCG (5000 UT) TABS Take 1 tablet by mouth every Wednesday.   Yes [provider]  insulin regular (NOVOLIN R,HUMULIN R) 100 units/mL injection Inject 5-8 Units into the skin 2 (two) times daily before a meal.   Yes [provider]  levothyroxine (SYNTHROID) 137 MCG tablet Take 137 mcg by mouth daily before breakfast. 12/19/18  Yes [provider]  nadolol (CORGARD) 40 MG tablet Take 40 mg by mouth daily. 08/19/20  Yes [provider]  NOVOLIN N RELION 100 UNIT/ML injection Inject 14 Units into the skin 2 (two) times daily.  09/14/19  Yes [provider]  ONETOUCH ULTRA test strip  12/19/18  Yes [provider]  sulfamethoxazole-trimethoprim (BACTRIM DS) 800-160 MG tablet Take 1 tablet by mouth 2 (two) times daily. 08/18/22  Yes Lazaro Arms, MD  torsemide (DEMADEX) 20 MG tablet Take 20 mg by mouth 2 (two) times daily.   Yes [provider]  Vitamin D, Ergocalciferol, (DRISDOL) 1.25 MG (50000 UT) CAPS capsule Take 50,000 Units by mouth every Saturday. 11/16/18  Yes [provider]  Cholecalciferol (VITAMIN D) 125 MCG (5000 UT) CAPS Take 5,000 Units by mouth every Wednesday.    [provider]  neomycin-polymyxin b-dexamethasone (MAXITROL) 3.5-10000-0.1 OINT Place into the right eye 2 (two) times daily. Patient not taking: Reported on 08/25/2022 06/11/22   [provider]      Allergies    Ciprofloxacin, Crestor [rosuvastatin], Lipitor [atorvastatin], Lisinopril, Losartan potassium, Lovastatin, Statins, Welchol [colesevelam], Zetia [ezetimibe], and Dilaudid [hydromorphone hcl]    Review of Systems   Review of Systems  Constitutional:  Positive for activity change, appetite change and fatigue.  Gastrointestinal:  Positive for diarrhea, nausea and vomiting.  Neurological:  Positive for dizziness.  All other systems reviewed and are negative.   Physical Exam Updated Vital Signs BP (!) 111/53 (BP Location: Left Arm)   Pulse (!) 59   Temp 97.7 F (36.5 C) (Oral)   Resp 16   Ht 5\' 6"  (1.676 m)  Wt 91.4 kg   SpO2 100%   BMI 32.52 kg/m  Physical Exam Vitals and nursing note reviewed.  Constitutional:      General: She is not in acute distress.    Appearance: Normal appearance. She is well-developed. She is not ill-appearing, toxic-appearing or diaphoretic.  HENT:     Head: Normocephalic and atraumatic.     Right Ear: External ear normal.     Left Ear: External ear normal.     Nose: Nose normal.     Mouth/Throat:     Mouth: Mucous membranes are moist.   Eyes:     Extraocular Movements: Extraocular movements intact.     Conjunctiva/sclera: Conjunctivae normal.  Cardiovascular:     Rate and Rhythm: Normal rate and regular rhythm.     Heart sounds: No murmur heard. Pulmonary:     Effort: Pulmonary effort is normal. No respiratory distress.     Breath sounds: Normal breath sounds. No wheezing or rales.  Chest:     Chest wall: No tenderness.  Abdominal:     General: There is no distension.     Palpations: Abdomen is soft.     Tenderness: There is no abdominal tenderness.  Musculoskeletal:        General: No swelling. Normal range of motion.     Cervical back: Normal range of motion and neck supple.  Skin:    General: Skin is warm and dry.     Coloration: Skin is pale. Skin is not jaundiced.  Neurological:     General: No focal deficit present.     Mental Status: She is alert and oriented to person, place, and time.     Cranial Nerves: No cranial nerve deficit.     Sensory: No sensory deficit.     Motor: No weakness.     Coordination: Coordination normal.  Psychiatric:        Mood and Affect: Mood normal.        Behavior: Behavior normal.        Thought Content: Thought content normal.        Judgment: Judgment normal.     ED Results / Procedures / Treatments   Labs (all labs ordered are listed, but only abnormal results are displayed) Labs Reviewed  COMPREHENSIVE METABOLIC PANEL - Abnormal; Notable for the following components:      Result Value   Sodium 134 (*)    CO2 18 (*)    Glucose, Bld 146 (*)    BUN 54 (*)    Creatinine, Ser 2.82 (*)    Calcium 7.6 (*)    Total Protein 5.2 (*)    Albumin 2.2 (*)    GFR, Estimated 17 (*)    All other components within normal limits  CBC WITH DIFFERENTIAL/PLATELET - Abnormal; Notable for the following components:   RBC 3.24 (*)    Hemoglobin 10.6 (*)    HCT 31.7 (*)    Platelets 48 (*)    All other components within normal limits  URINALYSIS, W/ REFLEX TO CULTURE  (INFECTION SUSPECTED) - Abnormal; Notable for the following components:   Color, Urine AMBER (*)    APPearance HAZY (*)    Hgb urine dipstick MODERATE (*)    Protein, ur 100 (*)    Leukocytes,Ua TRACE (*)    All other components within normal limits  TSH - Abnormal; Notable for the following components:   TSH 0.112 (*)    All other components within normal limits  GLUCOSE, CAPILLARY -  Abnormal; Notable for the following components:   Glucose-Capillary 158 (*)    All other components within normal limits  CULTURE, BLOOD (ROUTINE X 2)  CULTURE, BLOOD (ROUTINE X 2)  LACTIC ACID, PLASMA  LACTIC ACID, PLASMA  MAGNESIUM  T3, FREE  T4, FREE  HEMOGLOBIN A1C  HIV ANTIBODY (ROUTINE TESTING W REFLEX)  BASIC METABOLIC PANEL  CBC  TROPONIN I (HIGH SENSITIVITY)  TROPONIN I (HIGH SENSITIVITY)    EKG None  Radiology CT CHEST ABDOMEN PELVIS WO CONTRAST  Result Date: 08/25/2022 CLINICAL DATA:  Weakness, nausea and vomiting. Recent antibiotic treatment for UTI. Poly trauma, blunt. Fell today. EXAM: CT CHEST, ABDOMEN AND PELVIS WITHOUT CONTRAST TECHNIQUE: Multidetector CT imaging of the chest, abdomen and pelvis was performed following the standard protocol without IV contrast. RADIATION DOSE REDUCTION: This exam was performed according to the departmental dose-optimization program which includes automated exposure control, adjustment of the mA and/or kV according to patient size and/or use of iterative reconstruction technique. COMPARISON:  CT abdomen 08/20/2022 FINDINGS: CT CHEST FINDINGS Cardiovascular: Heart size is normal. There is coronary artery calcification and aortic atherosclerotic calcification. Mediastinum/Nodes: No mass or lymphadenopathy. Some varices are present. Lungs/Pleura: No pleural effusion. Mild chronic scarring at the lung bases left more than right. No evidence of pneumonia or mass lesion. Musculoskeletal: No thoracic region fracture. No evidence of rib or sternal fracture. CT  ABDOMEN PELVIS FINDINGS Hepatobiliary: Advanced cirrhosis of the liver as seen previously. Previous cholecystectomy. Pancreas: Chronic pancreatic atrophy and calcifications. No acute process. Spleen: Splenomegaly suggesting portal venous hypertension. Adrenals/Urinary Tract: Adrenal glands are normal. The left is not well seen because of regional varices. No renal parenchymal abnormality. Catheter present in the bladder. Stomach/Bowel: Stomach and small intestine are unremarkable. No acute colon finding. Vascular/Lymphatic: Aortic atherosclerosis. No aneurysm. IVC is normal. No adenopathy. Portal hypertension associated varices. Reproductive: Pessary in place.  No pelvic mass. Other: Free fluid, normally distributed. No hyperdense fluid. No free air. Musculoskeletal: Old appearing compression deformities of the L4 vertebral body. No evidence of acute regional fracture. IMPRESSION: 1. No acute or traumatic finding in the chest, abdomen or pelvis. 2. Advanced cirrhosis of the liver with portal hypertension, splenomegaly, varices and ascites. 3. Aortic atherosclerosis. Coronary artery calcification. 4. Chronic pancreatic atrophy and calcifications. 5. Old appearing compression fracture of the L4 vertebral body. No evidence of acute regional fracture. Aortic Atherosclerosis (ICD10-I70.0). Electronically Signed   By: Paulina Fusi M.D.   On: 08/25/2022 12:52   CT Head Wo Contrast  Result Date: 08/25/2022 CLINICAL DATA:  Head trauma, minor. EXAM: CT HEAD WITHOUT CONTRAST TECHNIQUE: Contiguous axial images were obtained from the base of the skull through the vertex without intravenous contrast. RADIATION DOSE REDUCTION: This exam was performed according to the departmental dose-optimization program which includes automated exposure control, adjustment of the mA and/or kV according to patient size and/or use of iterative reconstruction technique. COMPARISON:  Head CT 05/23/2017. FINDINGS: Brain: No acute intracranial  hemorrhage. Gray-white differentiation is preserved. No hydrocephalus or extra-axial collection. No mass effect or midline shift. Vascular: No hyperdense vessel or unexpected calcification. Skull: No calvarial fracture or suspicious bone lesion. Skull base is unremarkable. Sinuses/Orbits: Chronic right maxillary sinusitis. Orbits are unremarkable. Mastoids are well aerated. Other: None. IMPRESSION: No evidence of acute intracranial injury. Electronically Signed   By: Orvan Falconer M.D.   On: 08/25/2022 12:45   DG Knee 2 Views Right  Result Date: 08/25/2022 CLINICAL DATA:  Right knee pain since fall today about an hour ago  EXAM: RIGHT KNEE - 1-2 VIEW COMPARISON:  None available FINDINGS: No fracture or dislocation. No acute soft tissue abnormality. Atherosclerotic changes seen throughout visualized arterial segments. Deformity of the proximal fibular diaphysis consistent with remote healed fracture. IMPRESSION: No acute abnormality of the right knee. Electronically Signed   By: Acquanetta Belling M.D.   On: 08/25/2022 12:19   DG Chest 1 View  Result Date: 08/25/2022 CLINICAL DATA:  Evaluation of sepsis EXAM: CHEST  1 VIEW COMPARISON:  Chest radiograph dated 07/16/2020 FINDINGS: Normal lung volumes. No focal consolidations. No pleural effusion or pneumothorax. The heart size and mediastinal contours are within normal limits. Cervical spinal fixation hardware appears intact. IMPRESSION: No active disease. Electronically Signed   By: Agustin Cree M.D.   On: 08/25/2022 11:36    Procedures Procedures    Medications Ordered in ED Medications  allopurinol (ZYLOPRIM) tablet 100 mg (has no administration in time range)  nadolol (CORGARD) tablet 40 mg (has no administration in time range)  insulin aspart (novoLOG) injection 0-9 Units (has no administration in time range)  insulin aspart (novoLOG) injection 0-5 Units (has no administration in time range)  acetaminophen (TYLENOL) tablet 650 mg (has no administration  in time range)    Or  acetaminophen (TYLENOL) suppository 650 mg (has no administration in time range)  promethazine (PHENERGAN) tablet 12.5 mg (has no administration in time range)  polyethylene glycol (MIRALAX / GLYCOLAX) packet 17 g (has no administration in time range)  0.9 %  sodium chloride infusion (has no administration in time range)  nystatin (MYCOSTATIN/NYSTOP) topical powder (has no administration in time range)  lactated ringers bolus 500 mL (0 mLs Intravenous Stopped 08/25/22 1213)  ceFEPIme (MAXIPIME) 2 g in sodium chloride 0.9 % 100 mL IVPB (0 g Intravenous Stopped 08/25/22 1544)    ED Course/ Medical Decision Making/ A&P                             Medical Decision Making Amount and/or Complexity of Data Reviewed Labs: ordered. Radiology: ordered. ECG/medicine tests: ordered.  Risk Decision regarding hospitalization.   This patient presents to the ED for concern of dizziness, generalized weakness, this involves an extensive number of treatment options, and is a complaint that carries with it a high risk of complications and morbidity.  The differential diagnosis includes dehydration, persistent infection, metabolic derangements, anemia, Bactrim toxicity   Co morbidities that complicate the patient evaluation  HLD, HTN, GERD, cirrhosis, T2DM, CKD, neuropathy, gout, depression   Additional history obtained:  Additional history obtained from EMS External records from outside source obtained and reviewed including EMR   Lab Tests:  I Ordered, and personally interpreted labs.  The pertinent results include: AKI is present raising concern for Bactrim toxicity; non-anion gap metabolic acidosis is present.  Anemia is close to baseline.  No leukocytosis is present.  Although there is some hematuria, there is no evidence of persistent urine infection.   Imaging Studies ordered:  I ordered imaging studies including x-ray of chest and right knee, CT of head, chest,  abdomen, pelvis I independently visualized and interpreted imaging which showed no acute findings I agree with the radiologist interpretation   Cardiac Monitoring: / EKG:  The patient was maintained on a cardiac monitor.  I personally viewed and interpreted the cardiac monitored which showed an underlying rhythm of: Sinus rhythm   Problem List / ED Course / Critical interventions / Medication management  Patient presents for  multiple complaints.  These have been present over the past week.  Among these are generalized weakness, nausea, fatigue, and dizziness.  She did have vomiting today.  Weakness progressed to the point that she fell today.  She denies LOC.  Although she struck her right knee, she has been able to bear weight and there is no significant swelling or deformity on exam.  Patient arrives with Foley catheter in place.  Urine in Foley collection bag is cloudy.  Cefepime was ordered for empiric treatment of CAUTI.  New Foley catheter was placed.  IV fluids were ordered for hydration.  On lab work, patient has worsening kidney function.  Per chart review, she has been on 800-160 Bactrim twice daily for the past week.  Given her current kidney function, this is likely led to Bactrim toxicity which does explain her recent symptoms.  Urinalysis, drawn after replacement of Foley, shows microscopic hematuria without evidence of urine infection.  Patient was admitted for further management. I ordered medication including IV fluids for hydration Reevaluation of the patient after these medicines showed that the patient improved I have reviewed the patients home medicines and have made adjustments as needed   Social Determinants of Health:  Has access to outpatient care        Final Clinical Impression(s) / ED Diagnoses Final diagnoses:  AKI (acute kidney injury) (HCC)  Adverse effect of sulfamethoxazole, initial encounter    Rx / DC Orders ED Discharge Orders     None          Gloris Manchester, MD 08/25/22 1752

## 2022-08-25 NOTE — ED Triage Notes (Addendum)
Pt BIB [facility] for c/o generalized weakness and N/V.  Pt has been on antibiotic for UTI and today was last dose.  Also with right knee pain since fall today about an hour ago per report, denies hitting her head. IV placed by EMS, NS of 150 cc given and Zofran 4 mg IV en route. Nausea relieved per report. Reported pt with foley catheter in place with cloudy and blood in urine. Reported pt had a pessary placed and recently removed to replace, issues with the replacement, reported that part (ring only) still in place.

## 2022-08-25 NOTE — H&P (Addendum)
History and Physical    [name] WGN:[id number] DOB: 1950/10/11 DOA: 08/25/2022  PCP: Benita Stabile, MD   Patient coming from: Home  I have personally briefly reviewed patient's old medical records in [facility]  Chief Complaint: Weakness, Nausea, vomiting  HPI: [name] is a 72 y.o. female with medical history significant for hypertension, diabetes mellitus, gout, NASH liver cirrhosis, CKD 3. Patient presented to the ED with complaints of generalized weakness, nausea, poor oral intake, 1 episode of vomiting today.  She reports chronic dizziness but feels its worse. She had a pessary put in about a week ago, she also required a Foley placed due to urinary incontinence in the setting of the pessary. She reports part of the pessary has fallen out.  Has had persistent pelvic pain since placement of the pessary.  She was started on a course of Bactrim by her gynecologist. She was in the ED 5 days ago- 5/24 with complaints of weakness, her Foley was changed in the ED, as she complained of urine draining around the catheter.  She was diagnosed with UTI, and was told to complete the course of her Bactrim that she was prescribed by her gynecologist.   ED Course: Temperature 97.5.  Heart rate 55-59.  Respiratory 12-19.  Blood pressure systolic 112-115. Cr Elevated 2.8.  WBC 5.4.  Lactic acid 1.7.  UA with trace leukocytes. Chest, Abd and pelvic - without acute abnormality, shows advanced liver cirrhosis.  Head CT and knee x-rays unremarkable.   EDP reports urine initially appeared cloudy, Foley catheter was changed in ED, and subsequent urine appeared clear.  UA was drawn from subsequent urine sample, and showed trace leukocytes.  Blood culture was obtained IV cefepime given.  Review of Systems: As per HPI all other systems reviewed and negative.  Past Medical History:  Diagnosis Date   Alkaline phosphatase elevation    Anemia    ASD (atrial septal defect)    Cataract    both  eyes hx of   Chest pain    03-17-2013 last chest pain   Chronic kidney disease    Stage III kidney disease   Cirrhosis (HCC)    Depression    Diabetes mellitus type II    Family history of adverse reaction to anesthesia    Son hard to wake up   Fatigue    GERD (gastroesophageal reflux disease)    Gout    Headache(784.0)    occasional   Hyperlipidemia    Hypertension    Hypothyroidism    Neuropathy    Osteopenia    Peripheral neuropathy    Presence of pessary    Retinopathy     Past Surgical History:  Procedure Laterality Date   APPENDECTOMY     CATARACT EXTRACTION     CERVICAL SPINE SURGERY  2006   CHOLECYSTECTOMY N/A 04/07/2013   Procedure: LAPAROSCOPIC CHOLECYSTECTOMY WITH INTRAOPERATIVE CHOLANGIOGRAM;  Surgeon: Robyne Askew, MD;  Location: [facility];  Service: General;  Laterality: N/A;   COMBINED HYSTERECTOMY VAGINAL / OOPHORECTOMY / A&P REPAIR  1987   Unilateral oophorectomy, h/o uterine prolapse has right ovary   ERCP N/A 04/06/2013   Procedure: ENDOSCOPIC RETROGRADE CHOLANGIOPANCREATOGRAPHY (ERCP);  Surgeon: Theda Belfast, MD;  Location: [facility];  Service: Endoscopy;  Laterality: N/A;   ESOPHAGEAL BANDING N/A 09/26/2021   Procedure: ESOPHAGEAL BANDING;  Surgeon: Jeani Hawking, MD;  Location: [facility];  Service: Gastroenterology;  Laterality: N/A;   ESOPHAGEAL BANDING N/A 10/31/2021  Procedure: ESOPHAGEAL BANDING;  Surgeon: Jeani Hawking, MD;  Location: [facility];  Service: Gastroenterology;  Laterality: N/A;   ESOPHAGOGASTRODUODENOSCOPY (EGD) WITH PROPOFOL N/A 08/16/2020   Procedure: ESOPHAGOGASTRODUODENOSCOPY (EGD) WITH PROPOFOL;  Surgeon: Jeani Hawking, MD;  Location: [facility];  Service: Endoscopy;  Laterality: N/A;   ESOPHAGOGASTRODUODENOSCOPY (EGD) WITH PROPOFOL N/A 09/26/2021   Procedure: ESOPHAGOGASTRODUODENOSCOPY (EGD) WITH PROPOFOL;  Surgeon: Jeani Hawking, MD;  Location: [facility];  Service: Gastroenterology;  Laterality: N/A;    ESOPHAGOGASTRODUODENOSCOPY (EGD) WITH PROPOFOL N/A 10/31/2021   Procedure: ESOPHAGOGASTRODUODENOSCOPY (EGD) WITH PROPOFOL;  Surgeon: Jeani Hawking, MD;  Location: [facility];  Service: Gastroenterology;  Laterality: N/A;   EUS N/A 03/31/2013   Procedure: UPPER ENDOSCOPIC ULTRASOUND (EUS) LINEAR;  Surgeon: Theda Belfast, MD;  Location: [facility];  Service: Endoscopy;  Laterality: N/A;   REFRACTIVE SURGERY     SPINAL FUSION     c4-c7   TONSILLECTOMY  age 63     reports that she has never smoked. She has never used smokeless tobacco. She reports that she does not drink alcohol and does not use drugs.  Allergies  Allergen Reactions   Ciprofloxacin      severe yeast infection   Crestor [Rosuvastatin]     arthralgia/myalgia   Lipitor [Atorvastatin]     arthralgia/myalgia   Lisinopril Cough   Losartan Potassium Cough   Lovastatin     arthralgia/myalgia   Statins Other (See Comments)    arthralgia/myalgia   Welchol [Colesevelam]     arthralgia/myalgia   Zetia [Ezetimibe]     arthralgia/myalgia   Dilaudid [Hydromorphone Hcl] Nausea And Vomiting    After one vomiting episode no further vomiting    Family History  Problem Relation Age of Onset   COPD Mother    Lung cancer Father    Diabetes Sister    Cataracts Sister    Insulin resistance Daughter    Insulin resistance Son     Prior to Admission medications   Medication Sig Start Date End Date Taking? Authorizing Provider  allopurinol (ZYLOPRIM) 100 MG tablet Take 100 mg by mouth every evening.     [provider]  Cholecalciferol (VITAMIN D) 125 MCG (5000 UT) CAPS Take 5,000 Units by mouth every Wednesday.    [provider]  insulin regular (NOVOLIN R,HUMULIN R) 100 units/mL injection Inject 5-8 Units into the skin 2 (two) times daily before a meal.    [provider]  levothyroxine (SYNTHROID) 137 MCG tablet Take 137 mcg by mouth daily before breakfast. 12/19/18   [provider]   nadolol (CORGARD) 40 MG tablet Take 40 mg by mouth daily. 08/19/20   [provider]  NOVOLIN N RELION 100 UNIT/ML injection Inject 14 Units into the skin 2 (two) times daily. 09/14/19   [provider]  [facility] ULTRA test strip  12/19/18   [provider]  sulfamethoxazole-trimethoprim (BACTRIM DS) 800-160 MG tablet Take 1 tablet by mouth 2 (two) times daily. 08/18/22   Lazaro Arms, MD  torsemide (DEMADEX) 20 MG tablet Take 20 mg by mouth 2 (two) times daily.    [provider]  Vitamin D, Ergocalciferol, (DRISDOL) 1.25 MG (50000 UT) CAPS capsule Take 50,000 Units by mouth every Saturday. 11/16/18   [provider]    Physical Exam: Vitals:   08/25/22 1223 08/25/22 1245 08/25/22 1300 08/25/22 1345  BP:   (!) 113/54   Pulse:  (!) 59 (!) 55 (!) 59  Resp: 12 19 16 16   Temp:  TempSrc:      SpO2:  100% 97% 95%    Constitutional: NAD, calm, comfortable Vitals:   08/25/22 1223 08/25/22 1245 08/25/22 1300 08/25/22 1345  BP:   (!) 113/54   Pulse:  (!) 59 (!) 55 (!) 59  Resp: 12 19 16 16   Temp:      TempSrc:      SpO2:  100% 97% 95%   Eyes: PERRL, lids and conjunctivae normal ENMT: Mucous membranes are moist. Neck: normal, supple, no masses, no thyromegaly Respiratory: clear to auscultation bilaterally, no wheezing, no crackles. Normal respiratory effort. No accessory muscle use.  Cardiovascular: Regular rate and rhythm, no murmurs / rubs / gallops. No extremity edema.  Extremities warm Abdomen: no tenderness, no masses palpated. No hepatosplenomegaly. Bowel sounds positive.  Foley present, draining amber coloured clear urine Musculoskeletal: no clubbing / cyanosis. No joint deformity upper and lower extremities.  Skin: no rashes, lesions, ulcers. No induration Neurologic: No facial asymmetry, 4+5 strength in all extremities, good grip strength, speech clear without aphasia Psychiatric: Normal judgment and insight. Alert and oriented x 3.  Normal mood.   Labs on Admission: I have personally reviewed following labs and imaging studies  CBC: Recent Labs  Lab 08/21/22 0012 08/25/22 1156  WBC 9.0 5.4  NEUTROABS 7.5 4.1  HGB 11.8* 10.6*  HCT 34.9* 31.7*  MCV 98.6 97.8  PLT 60* 48*   Basic Metabolic Panel: Recent Labs  Lab 08/21/22 0012 08/25/22 1156  NA 137 134*  K 4.2 3.8  CL 103 104  CO2 24 18*  GLUCOSE 123* 146*  BUN 40* 54*  CREATININE 2.33* 2.82*  CALCIUM 7.7* 7.6*  MG  --  2.3   GFR: Estimated Creatinine Clearance: 21.2 mL/min (A) (by C-G formula based on SCr of 2.82 mg/dL (H)). Liver Function Tests: Recent Labs  Lab 08/21/22 0012 08/25/22 1156  AST 33 30  ALT 19 16  ALKPHOS 122 102  BILITOT 1.7* 0.8  PROT 5.8* 5.2*  ALBUMIN 2.4* 2.2*   Recent Labs  Lab 08/21/22 0012  LIPASE 20   CBG: Recent Labs  Lab 08/20/22 2321  GLUCAP 140*   Thyroid Function Tests: Recent Labs    08/25/22 1208  TSH 0.112*   Urine analysis:    Component Value Date/Time   COLORURINE AMBER (A) 08/25/2022 1253   APPEARANCEUR HAZY (A) 08/25/2022 1253   LABSPEC 1.009 08/25/2022 1253   PHURINE 6.0 08/25/2022 1253   GLUCOSEU NEGATIVE 08/25/2022 1253   HGBUR MODERATE (A) 08/25/2022 1253   BILIRUBINUR NEGATIVE 08/25/2022 1253   KETONESUR NEGATIVE 08/25/2022 1253   PROTEINUR 100 (A) 08/25/2022 1253   UROBILINOGEN 0.2 07/19/2014 0510   NITRITE NEGATIVE 08/25/2022 1253   LEUKOCYTESUR TRACE (A) 08/25/2022 1253    Radiological Exams on Admission: CT CHEST ABDOMEN PELVIS WO CONTRAST  Result Date: 08/25/2022 CLINICAL DATA:  Weakness, nausea and vomiting. Recent antibiotic treatment for UTI. Poly trauma, blunt. Fell today. EXAM: CT CHEST, ABDOMEN AND PELVIS WITHOUT CONTRAST TECHNIQUE: Multidetector CT imaging of the chest, abdomen and pelvis was performed following the standard protocol without IV contrast. RADIATION DOSE REDUCTION: This exam was performed according to the departmental dose-optimization program  which includes automated exposure control, adjustment of the mA and/or kV according to patient size and/or use of iterative reconstruction technique. COMPARISON:  CT abdomen 08/20/2022 FINDINGS: CT CHEST FINDINGS Cardiovascular: Heart size is normal. There is coronary artery calcification and aortic atherosclerotic calcification. Mediastinum/Nodes: No mass or lymphadenopathy. Some varices are present. Lungs/Pleura: No pleural  effusion. Mild chronic scarring at the lung bases left more than right. No evidence of pneumonia or mass lesion. Musculoskeletal: No thoracic region fracture. No evidence of rib or sternal fracture. CT ABDOMEN PELVIS FINDINGS Hepatobiliary: Advanced cirrhosis of the liver as seen previously. Previous cholecystectomy. Pancreas: Chronic pancreatic atrophy and calcifications. No acute process. Spleen: Splenomegaly suggesting portal venous hypertension. Adrenals/Urinary Tract: Adrenal glands are normal. The left is not well seen because of regional varices. No renal parenchymal abnormality. Catheter present in the bladder. Stomach/Bowel: Stomach and small intestine are unremarkable. No acute colon finding. Vascular/Lymphatic: Aortic atherosclerosis. No aneurysm. IVC is normal. No adenopathy. Portal hypertension associated varices. Reproductive: Pessary in place.  No pelvic mass. Other: Free fluid, normally distributed. No hyperdense fluid. No free air. Musculoskeletal: Old appearing compression deformities of the L4 vertebral body. No evidence of acute regional fracture. IMPRESSION: 1. No acute or traumatic finding in the chest, abdomen or pelvis. 2. Advanced cirrhosis of the liver with portal hypertension, splenomegaly, varices and ascites. 3. Aortic atherosclerosis. Coronary artery calcification. 4. Chronic pancreatic atrophy and calcifications. 5. Old appearing compression fracture of the L4 vertebral body. No evidence of acute regional fracture. Aortic Atherosclerosis (ICD10-I70.0).  Electronically Signed   By: Paulina Fusi M.D.   On: 08/25/2022 12:52   CT Head Wo Contrast  Result Date: 08/25/2022 CLINICAL DATA:  Head trauma, minor. EXAM: CT HEAD WITHOUT CONTRAST TECHNIQUE: Contiguous axial images were obtained from the base of the skull through the vertex without intravenous contrast. RADIATION DOSE REDUCTION: This exam was performed according to the departmental dose-optimization program which includes automated exposure control, adjustment of the mA and/or kV according to patient size and/or use of iterative reconstruction technique. COMPARISON:  Head CT 05/23/2017. FINDINGS: Brain: No acute intracranial hemorrhage. Gray-white differentiation is preserved. No hydrocephalus or extra-axial collection. No mass effect or midline shift. Vascular: No hyperdense vessel or unexpected calcification. Skull: No calvarial fracture or suspicious bone lesion. Skull base is unremarkable. Sinuses/Orbits: Chronic right maxillary sinusitis. Orbits are unremarkable. Mastoids are well aerated. Other: None. IMPRESSION: No evidence of acute intracranial injury. Electronically Signed   By: Orvan Falconer M.D.   On: 08/25/2022 12:45   DG Knee 2 Views Right  Result Date: 08/25/2022 CLINICAL DATA:  Right knee pain since fall today about an hour ago EXAM: RIGHT KNEE - 1-2 VIEW COMPARISON:  None available FINDINGS: No fracture or dislocation. No acute soft tissue abnormality. Atherosclerotic changes seen throughout visualized arterial segments. Deformity of the proximal fibular diaphysis consistent with remote healed fracture. IMPRESSION: No acute abnormality of the right knee. Electronically Signed   By: Acquanetta Belling M.D.   On: 08/25/2022 12:19   DG Chest 1 View  Result Date: 08/25/2022 CLINICAL DATA:  Evaluation of sepsis EXAM: CHEST  1 VIEW COMPARISON:  Chest radiograph dated 07/16/2020 FINDINGS: Normal lung volumes. No focal consolidations. No pleural effusion or pneumothorax. The heart size and  mediastinal contours are within normal limits. Cervical spinal fixation hardware appears intact. IMPRESSION: No active disease. Electronically Signed   By: Agustin Cree M.D.   On: 08/25/2022 11:36    EKG: Independently reviewed.  Sinus rhythm, rate 53, QTc prolonged 513.  T wave abnormalities in lead V2, I and aVL which are unchanged from prior.    Assessment/Plan Principal Problem:   Acute kidney injury superimposed on chronic kidney disease (HCC) Active Problems:   Vaginal prolapse   Hypothyroidism   Type 2 diabetes mellitus with diabetic chronic kidney disease (HCC)   Hypertension with  renal disease   Liver cirrhosis secondary to NASH (HCC)   Thrombocytopenia (HCC)   Prolonged QT interval   Assessment and Plan: * Acute kidney injury superimposed on chronic kidney disease (HCC) Creatinine elevated at 2.8, baseline 1.2-1.5.  CKD stage III A-B.  In the setting of poor oral intake, Bactrim., torsemide 20mg  BID. - bolus given continue N/s 100cc/hr x 15hrs -Was to complete her last day of Bactrim today.  She has been given cefepime in the ED.   -UA with trace leukocytes, improved from prior UA, no fever no leukocytosis, she has had pelvic pain since pessaries were put in, hold off on antibiotics at this time.  No recent urine cultures on file.  -Follow-up blood cultures ordered in the ED.    Vaginal prolapse Follows with Dr. Despina Hidden. Please see last note 5/22.  Dr. Despina Hidden reached out to me via secure chat- patient has a huge vaginal/bladder prolapse prolapse, and foley was placed because she "kinks" off her urethra and has huge urinary retention. The pessary helps with this and she had a double pessary but it caused vaginal mucosal injury so now has just the one(Gelhorn).  Prolonged QT interval Qt- 513.  QTc prolonged, not new.  K- 3.8. - Check Mag.  Thrombocytopenia (HCC) Platelets 48, baseline.  Likely due to NASH liver cirrhosis.  Liver cirrhosis secondary to NASH (HCC) Stable.   With thrombocytopenia  Hypertension with renal disease Stable. -Resume nadolol, torsemide in a.m.  Type 2 diabetes mellitus with diabetic chronic kidney disease (HCC) She is on NovoLin  14u BID - SSI- S - HgbA1c  Hypothyroidism TSH low at 0.112.  She is on Synthroid -Considering low TSH, may need to discontinue Synthroid -Follow-up T3, T4 ordered in ED   DVT prophylaxis: SCDS Code Status: FULL code-confirmed with patient and granddaughter at bedside Family Communication: Granddaughter at bedside. Disposition Plan: ~ 1 -2 days Consults called: None Admission status:  Obs tele    Author: Onnie Boer, MD 08/25/2022 4:28 PM  For on call review [email or web address].

## 2022-08-25 NOTE — Assessment & Plan Note (Signed)
Follows with Dr. Despina Hidden. Please see last note 5/22.  Dr. Despina Hidden reached out to me via secure chat- patient has a huge vaginal/bladder prolapse prolapse, and foley was placed because she "kinks" off her urethra and has huge urinary retention. The pessary helps with this and she had a double pessary but it caused vaginal mucosal injury so now has just the one(Gelhorn).

## 2022-08-25 NOTE — Assessment & Plan Note (Signed)
>>  ASSESSMENT AND PLAN FOR ACUTE KIDNEY INJURY SUPERIMPOSED ON CHRONIC KIDNEY DISEASE (HCC) WRITTEN ON 08/25/2022  4:26 PM BY EMOKPAE, EJIROGHENE E, MD  Creatinine elevated at 2.8, baseline 1.2-1.5.  CKD stage III A-B.  In the setting of poor oral intake, Bactrim., torsemide 20mg  BID. - bolus given continue N/s 100cc/hr x 15hrs -Was to complete her last day of Bactrim today.  She has been given cefepime in the ED.   -UA with trace leukocytes, improved from prior UA, no fever no leukocytosis, she has had pelvic pain since pessaries were put in, hold off on antibiotics at this time.  No recent urine cultures on file.  -Follow-up blood cultures ordered in the ED.

## 2022-08-25 NOTE — Assessment & Plan Note (Signed)
Platelets 48, baseline.  Likely due to NASH liver cirrhosis.

## 2022-08-26 DIAGNOSIS — N184 Chronic kidney disease, stage 4 (severe): Secondary | ICD-10-CM | POA: Diagnosis present

## 2022-08-26 DIAGNOSIS — Z888 Allergy status to other drugs, medicaments and biological substances status: Secondary | ICD-10-CM | POA: Diagnosis not present

## 2022-08-26 DIAGNOSIS — D631 Anemia in chronic kidney disease: Secondary | ICD-10-CM | POA: Diagnosis present

## 2022-08-26 DIAGNOSIS — Z6832 Body mass index (BMI) 32.0-32.9, adult: Secondary | ICD-10-CM | POA: Diagnosis not present

## 2022-08-26 DIAGNOSIS — K219 Gastro-esophageal reflux disease without esophagitis: Secondary | ICD-10-CM | POA: Diagnosis present

## 2022-08-26 DIAGNOSIS — Z881 Allergy status to other antibiotic agents status: Secondary | ICD-10-CM | POA: Diagnosis not present

## 2022-08-26 DIAGNOSIS — N179 Acute kidney failure, unspecified: Secondary | ICD-10-CM | POA: Diagnosis present

## 2022-08-26 DIAGNOSIS — N993 Prolapse of vaginal vault after hysterectomy: Secondary | ICD-10-CM | POA: Diagnosis present

## 2022-08-26 DIAGNOSIS — E785 Hyperlipidemia, unspecified: Secondary | ICD-10-CM | POA: Diagnosis present

## 2022-08-26 DIAGNOSIS — E1122 Type 2 diabetes mellitus with diabetic chronic kidney disease: Secondary | ICD-10-CM | POA: Diagnosis present

## 2022-08-26 DIAGNOSIS — E1142 Type 2 diabetes mellitus with diabetic polyneuropathy: Secondary | ICD-10-CM | POA: Diagnosis present

## 2022-08-26 DIAGNOSIS — K7581 Nonalcoholic steatohepatitis (NASH): Secondary | ICD-10-CM | POA: Diagnosis present

## 2022-08-26 DIAGNOSIS — N189 Chronic kidney disease, unspecified: Secondary | ICD-10-CM | POA: Diagnosis not present

## 2022-08-26 DIAGNOSIS — M109 Gout, unspecified: Secondary | ICD-10-CM | POA: Diagnosis present

## 2022-08-26 DIAGNOSIS — Z981 Arthrodesis status: Secondary | ICD-10-CM | POA: Diagnosis not present

## 2022-08-26 DIAGNOSIS — Z794 Long term (current) use of insulin: Secondary | ICD-10-CM | POA: Diagnosis not present

## 2022-08-26 DIAGNOSIS — Z825 Family history of asthma and other chronic lower respiratory diseases: Secondary | ICD-10-CM | POA: Diagnosis not present

## 2022-08-26 DIAGNOSIS — D696 Thrombocytopenia, unspecified: Secondary | ICD-10-CM | POA: Diagnosis present

## 2022-08-26 DIAGNOSIS — E669 Obesity, unspecified: Secondary | ICD-10-CM | POA: Diagnosis present

## 2022-08-26 DIAGNOSIS — Z885 Allergy status to narcotic agent status: Secondary | ICD-10-CM | POA: Diagnosis not present

## 2022-08-26 DIAGNOSIS — E11319 Type 2 diabetes mellitus with unspecified diabetic retinopathy without macular edema: Secondary | ICD-10-CM | POA: Diagnosis present

## 2022-08-26 DIAGNOSIS — E039 Hypothyroidism, unspecified: Secondary | ICD-10-CM | POA: Diagnosis present

## 2022-08-26 DIAGNOSIS — Z801 Family history of malignant neoplasm of trachea, bronchus and lung: Secondary | ICD-10-CM | POA: Diagnosis not present

## 2022-08-26 DIAGNOSIS — K7460 Unspecified cirrhosis of liver: Secondary | ICD-10-CM | POA: Diagnosis present

## 2022-08-26 DIAGNOSIS — I129 Hypertensive chronic kidney disease with stage 1 through stage 4 chronic kidney disease, or unspecified chronic kidney disease: Secondary | ICD-10-CM | POA: Diagnosis present

## 2022-08-26 LAB — BASIC METABOLIC PANEL
Anion gap: 10 (ref 5–15)
BUN: 54 mg/dL — ABNORMAL HIGH (ref 8–23)
CO2: 19 mmol/L — ABNORMAL LOW (ref 22–32)
Calcium: 7.4 mg/dL — ABNORMAL LOW (ref 8.9–10.3)
Chloride: 106 mmol/L (ref 98–111)
Creatinine, Ser: 2.65 mg/dL — ABNORMAL HIGH (ref 0.44–1.00)
GFR, Estimated: 19 mL/min — ABNORMAL LOW (ref >60)
Glucose, Bld: 139 mg/dL — ABNORMAL HIGH (ref 70–99)
Potassium: 3.6 mmol/L (ref 3.5–5.1)
Sodium: 135 mmol/L (ref 135–145)

## 2022-08-26 LAB — GLUCOSE, CAPILLARY
Glucose-Capillary: 132 mg/dL — ABNORMAL HIGH (ref 70–99)
Glucose-Capillary: 150 mg/dL — ABNORMAL HIGH (ref 70–99)
Glucose-Capillary: 180 mg/dL — ABNORMAL HIGH (ref 70–99)
Glucose-Capillary: 172 mg/dL — ABNORMAL HIGH (ref 70–99)

## 2022-08-26 LAB — CBC
HCT: 28.6 % — ABNORMAL LOW (ref 36.0–46.0)
Hemoglobin: 9.4 g/dL — ABNORMAL LOW (ref 12.0–15.0)
MCH: 32.5 pg (ref 26.0–34.0)
MCHC: 32.9 g/dL (ref 30.0–36.0)
MCV: 99 fL (ref 80.0–100.0)
Platelets: 41 10*3/uL — ABNORMAL LOW (ref 150–400)
RBC: 2.89 MIL/uL — ABNORMAL LOW (ref 3.87–5.11)
RDW: 15.2 % (ref 11.5–15.5)
WBC: 4.3 10*3/uL (ref 4.0–10.5)
nRBC: 0 % (ref 0.0–0.2)

## 2022-08-26 LAB — T3, FREE: T3, Free: 1.4 pg/mL — ABNORMAL LOW (ref 2.0–4.4)

## 2022-08-26 LAB — HIV ANTIBODY (ROUTINE TESTING W REFLEX): HIV Screen 4th Generation wRfx: NONREACTIVE

## 2022-08-26 LAB — CULTURE, BLOOD (ROUTINE X 2): Special Requests: ADEQUATE

## 2022-08-26 MED ORDER — CHLORHEXIDINE GLUCONATE CLOTH 2 % EX PADS
6.0000 | MEDICATED_PAD | Freq: Every day | CUTANEOUS | Status: DC
  Administered 2022-08-26 – 2022-08-28 (×3): 6 via TOPICAL

## 2022-08-26 MED ORDER — SODIUM CHLORIDE 0.9 % IV SOLN: INTRAVENOUS | Status: AC

## 2022-08-26 NOTE — Progress Notes (Signed)
Transition of Care [facility]) - Inpatient Brief Assessment   Patient Details  Name: [name] MRN: [id number] Date of Birth: 1950/09/12  Transition of Care [facility]) CM/SW Contact:    Leitha Bleak, RN Phone Number: 08/26/2022, 10:25 AM   Clinical Narrative:  Patient admitted with acute kidney injury, she has family support. Continue work up, TOC following.   Transition of Care Asessment: Insurance and Status: (P) Insurance coverage has been reviewed Patient has primary care physician: (P) Yes Home environment has been reviewed: (P) Live alone -  Children support Prior level of function:: (P) Independent with rollator Prior/Current Home Services: (P) No current home services Social Determinants of Health Reivew: (P) SDOH reviewed no interventions necessary Readmission risk has been reviewed: (P) Yes Transition of care needs: (P) no transition of care needs at this time

## 2022-08-26 NOTE — Evaluation (Signed)
Physical Therapy Evaluation Patient Details Name: [name] MRN: [id number] DOB: May 20, 1950 Today's Date: 08/26/2022  History of Present Illness  [name] is a 72 y.o. female with medical history significant for hypertension, diabetes mellitus, gout, NASH liver cirrhosis, CKD 3.  Patient presented to the ED with complaints of generalized weakness, nausea, poor oral intake, 1 episode of vomiting today.  She reports chronic dizziness but feels its worse. She had a pessary put in about a week ago, she also required a Foley placed due to urinary incontinence in the setting of the pessary. She reports part of the pessary has fallen out.  Has had persistent pelvic pain since placement of the pessary.  She was started on a course of Bactrim by her gynecologist.  She was in the ED 5 days ago- 5/24 with complaints of weakness, her Foley was changed in the ED, as she complained of urine draining around the catheter.  She was diagnosed with UTI, and was told to complete the course of her Bactrim that she was prescribed by her gynecologist.   Clinical Impression  Patient c/o severe pain over buttocks during bed mobility, required assistance for moving BLE when getting back in bed, unsteady on feet with incoordination of legs during gait training mostly due to c/o dizziness and requested to go back to bed due to fatigue after having sat up in chair earlier when assisted by nursing staff.  Patient will benefit from continued skilled physical therapy in hospital and recommended venue below to increase strength, balance, endurance for safe ADLs and gait.        Recommendations for follow up therapy are one component of a multi-disciplinary discharge planning process, led by the attending physician.  Recommendations may be updated based on patient status, additional functional criteria and insurance authorization.  Follow Up Recommendations Can patient physically be transported by private vehicle: Yes      Assistance Recommended at Discharge Set up Supervision/Assistance  Patient can return home with the following  A little help with walking and/or transfers;A little help with bathing/dressing/bathroom;Help with stairs or ramp for entrance;Assistance with cooking/housework    Equipment Recommendations None recommended by PT  Recommendations for Other Services       Functional Status Assessment Patient has had a recent decline in their functional status and demonstrates the ability to make significant improvements in function in a reasonable and predictable amount of time.     Precautions / Restrictions Precautions Precautions: Fall Restrictions Weight Bearing Restrictions: No      Mobility  Bed Mobility Overal bed mobility: Needs Assistance Bed Mobility: Supine to Sit, Sit to Supine     Supine to sit: Min assist Sit to supine: Min assist, Mod assist   General bed mobility comments: requires assistance for moving legs mostly when getting back in bed    Transfers Overall transfer level: Needs assistance Equipment used: Rolling walker (2 wheels) Transfers: Sit to/from Stand, Bed to chair/wheelchair/BSC Sit to Stand: Min guard, Min assist   Step pivot transfers: Min guard, Min assist       General transfer comment: unsteady labored movement    Ambulation/Gait Ambulation/Gait assistance: Editor, commissioning (Feet): 35 Feet Assistive device: Rolling walker (2 wheels) Gait Pattern/deviations: Decreased step length - right, Decreased step length - left, Decreased stride length, Drifts right/left, Knees buckling Gait velocity: decreased     General Gait Details: unsteady labored cadence with frequent drifting right/left with mild buckling of knees and incoordination of legs, limited  mostly due to c/o dizziness  Stairs            Wheelchair Mobility    Modified Rankin (Stroke Patients Only)       Balance Overall balance assessment: Needs  assistance Sitting-balance support: Feet supported, No upper extremity supported Sitting balance-Leahy Scale: Fair Sitting balance - Comments: fair/good seated at EOB   Standing balance support: During functional activity, Bilateral upper extremity supported Standing balance-Leahy Scale: Poor Standing balance comment: fair/poor using RW                             Pertinent Vitals/Pain Pain Assessment Pain Assessment: Faces Faces Pain Scale: Hurts little more Pain Location: pressure sore over buttocks Pain Descriptors / Indicators: Sore, Grimacing, Discomfort, Guarding Pain Intervention(s): Limited activity within patient's tolerance, Monitored during session, Repositioned    Home Living Family/patient expects to be discharged to:: Private residence Living Arrangements: Alone Available Help at Discharge: Family;Available PRN/intermittently Type of Home: House Home Access: Stairs to enter Entrance Stairs-Rails: Right Entrance Stairs-Number of Steps: 2   Home Layout: One level Home Equipment: Rollator (4 wheels);Cane - single point;Shower seat      Prior Function Prior Level of Function : Independent/Modified Independent;Driving             Mobility Comments: household and short distanced community ambulator without AD, drives ADLs Comments: Independent, sometimes have groceries delivered     Hand Dominance        Extremity/Trunk Assessment   Upper Extremity Assessment Upper Extremity Assessment: Generalized weakness    Lower Extremity Assessment Lower Extremity Assessment: Generalized weakness    Cervical / Trunk Assessment Cervical / Trunk Assessment: Normal  Communication   Communication: No difficulties  Cognition Arousal/Alertness: Awake/alert Behavior During Therapy: WFL for tasks assessed/performed Overall Cognitive Status: Within Functional Limits for tasks assessed                                          General  Comments      Exercises     Assessment/Plan    PT Assessment Patient needs continued PT services  PT Problem List Decreased strength;Decreased activity tolerance;Decreased balance;Decreased mobility       PT Treatment Interventions DME instruction;Gait training;Stair training;Functional mobility training;Therapeutic activities;Therapeutic exercise;Balance training    PT Goals (Current goals can be found in the Care Plan section)  Acute Rehab PT Goals Patient Stated Goal: return home after rehab PT Goal Formulation: With patient Time For Goal Achievement: 09/09/22 Potential to Achieve Goals: Good    Frequency Min 3X/week     Co-evaluation               AM-PAC PT "6 Clicks" Mobility  Outcome Measure Help needed turning from your back to your side while in a flat bed without using bedrails?: A Little Help needed moving from lying on your back to sitting on the side of a flat bed without using bedrails?: A Little Help needed moving to and from a bed to a chair (including a wheelchair)?: A Little Help needed standing up from a chair using your arms (e.g., wheelchair or bedside chair)?: A Little Help needed to walk in hospital room?: A Lot Help needed climbing 3-5 steps with a railing? : A Lot 6 Click Score: 16    End of Session   Activity Tolerance: Patient tolerated  treatment well;Patient limited by fatigue Patient left: in bed;with call bell/phone within reach Nurse Communication: Mobility status PT Visit Diagnosis: Unsteadiness on feet (R26.81);Other abnormalities of gait and mobility (R26.89);Muscle weakness (generalized) (M62.81)    Time: 4540-9811 PT Time Calculation (min) (ACUTE ONLY): 25 min   Charges:   PT Evaluation $PT Eval Moderate Complexity: 1 Mod PT Treatments $Therapeutic Activity: 23-37 mins        3:48 PM, 08/26/22 Ocie Bob, MPT Physical Therapist with [facility] [phone] office [phone] mobile phone

## 2022-08-26 NOTE — TOC Initial Note (Signed)
Transition of Care [facility]) - Initial/Assessment Note    Patient Details  Name: [name] MRN: [id number] Date of Birth: Jun 26, 1950  [facility]) CM/SW Contact:    Leitha Bleak, RN Phone Number: 08/26/2022, 4:16 PM  Clinical Narrative:      PT is recommending SNF.  Patient is agreeable. FL2 completed and sent out for bed offers.              Expected Discharge Plan: Skilled Nursing Facility Barriers to Discharge: Continued Medical Work up   Patient Goals and CMS Choice Patient states their goals for this hospitalization and ongoing recovery are:: agreeable to SNF CMS Medicare.gov Compare Post Acute Care list provided to:: Patient Choice offered to / list presented to : Patient [place] ownership interest in [facility].provided to:: Patient    Expected Discharge Plan and Services     Prior Living Arrangements/Services   Lives with:: Self Patient language and need for interpreter reviewed:: Yes        Need for Family Participation in Patient Care: Yes (Comment) Care giver support system in place?: Yes (comment) Current home services: DME Criminal Activity/Legal Involvement Pertinent to Current Situation/Hospitalization: No - Comment as needed  Activities of Daily Living Home Assistive Devices/Equipment: Walker (specify type) Nature conservation officer) ADL Screening (condition at time of admission) Patient's cognitive ability adequate to safely complete daily activities?: Yes Is the patient deaf or have difficulty hearing?: Yes Does the patient have difficulty seeing, even when wearing glasses/contacts?: No Does the patient have difficulty concentrating, remembering, or making decisions?: No Patient able to express need for assistance with ADLs?: Yes Does the patient have difficulty dressing or bathing?: No Independently performs ADLs?: Yes (appropriate for developmental age) Does the patient have difficulty walking or climbing stairs?: Yes Weakness of Legs:  Both Weakness of Arms/Hands: None  Permission Sought/Granted     Emotional Assessment     Affect (typically observed): Accepting Orientation: : Oriented to Self, Oriented to Place, Oriented to  Time, Oriented to Situation Alcohol / Substance Use: Not Applicable Psych Involvement: No (comment)  Admission diagnosis:  Acute kidney injury superimposed on chronic kidney disease (HCC) [N17.9, N18.9] AKI (acute kidney injury) (HCC) [N17.9] Patient Active Problem List   Diagnosis Date Noted   AKI (acute kidney injury) (HCC) 08/26/2022   Acute kidney injury superimposed on chronic kidney disease (HCC) 08/25/2022   Prolonged QT interval 08/25/2022   Vaginal prolapse 08/25/2022   Thrombocytopenia (HCC) 07/30/2017   CKD stage 3 due to type 2 diabetes mellitus (HCC) 06/30/2017   Liver cirrhosis secondary to NASH (HCC) 06/30/2017   Hypertension with renal disease 03/19/2017   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/19/2017   Tremor 08/06/2016   Vitreous hemorrhage of left eye (HCC) 01/29/2015   Diabetic macular edema (HCC) 12/25/2014   Hypertensive retinopathy of both eyes 12/25/2014   Cholecystitis 04/05/2013   Proliferative diabetic retinopathy (HCC) 08/04/2011   Bilateral nondiabetic proliferative retinopathy 04/03/2011   Type 2 diabetes mellitus with diabetic chronic kidney disease (HCC) 02/05/2010   CHEST PAIN 02/05/2010   Hypothyroidism 03/20/2008   HYPERLIPIDEMIA 03/20/2008   RENAL DISEASE, CHRONIC, STAGE II 03/20/2008   DEPRESSION 02/21/2008   PERIPHERAL NEUROPATHY 02/21/2008   Pseudophakia of both eyes 02/21/2008   GERD 02/21/2008   PCP:  Benita Stabile, MD Pharmacy:   [facility] [address], [place] - [address] [place] [address] Phone: [phone] Fax: [phone]     Social  Determinants of Health (SDOH) Social History: SDOH Screenings   Food Insecurity: No Food Insecurity (08/25/2022)  Housing: Low Risk   (08/25/2022)  Transportation Needs: No Transportation Needs (08/25/2022)  Utilities: Not At Risk (08/25/2022)  Alcohol Screen: Low Risk  (10/30/2019)  Depression (PHQ2-9): Medium Risk (10/30/2019)  Financial Resource Strain: Medium Risk (10/30/2019)  Physical Activity: Inactive (10/30/2019)  Social Connections: Moderately Integrated (10/30/2019)  Stress: No Stress Concern Present (10/30/2019)  Tobacco Use: Low Risk  (08/25/2022)   SDOH Interventions:    Readmission Risk Interventions     No data to display

## 2022-08-26 NOTE — Plan of Care (Signed)
  Problem: Acute Rehab PT Goals(only PT should resolve) Goal: Pt Will Go Supine/Side To Sit Outcome: Progressing Flowsheets (Taken 08/26/2022 1550) Pt will go Supine/Side to Sit: with min guard assist Goal: Patient Will Transfer Sit To/From Stand Outcome: Progressing Flowsheets (Taken 08/26/2022 1550) Patient will transfer sit to/from stand: with min guard assist Goal: Pt Will Transfer Bed To Chair/Chair To Bed Outcome: Progressing Flowsheets (Taken 08/26/2022 1550) Pt will Transfer Bed to Chair/Chair to Bed: min guard assist Goal: Pt Will Ambulate Outcome: Progressing Flowsheets (Taken 08/26/2022 1550) Pt will Ambulate:  75 feet  with min guard assist  with rolling walker   3:50 PM, 08/26/22 Ocie Bob, MPT Physical Therapist with [facility] [phone] office [phone] mobile phone

## 2022-08-26 NOTE — [place] FL2 (Signed)
[place] MEDICAID FL2 LEVEL OF CARE FORM     IDENTIFICATION  Patient Name: [name] Birthdate: 08-24-1950 Sex: female Admission Date (Current Location): 08/25/2022  [facility] and [place] Number:  [employer] and Address:  [facility],  [address], [place] [address]      Provider Number: [id number]  Attending Physician Name and Address:  Erick Blinks, DO  Relative Name and Phone Number:       Current Level of Care: Hospital Recommended Level of Care: Skilled Nursing Facility Prior Approval Number:    Date Approved/Denied:   PASRR Number: [id number]  Discharge Plan: SNF    Current Diagnoses: Patient Active Problem List   Diagnosis Date Noted   AKI (acute kidney injury) (HCC) 08/26/2022   Acute kidney injury superimposed on chronic kidney disease (HCC) 08/25/2022   Prolonged QT interval 08/25/2022   Vaginal prolapse 08/25/2022   Thrombocytopenia (HCC) 07/30/2017   CKD stage 3 due to type 2 diabetes mellitus (HCC) 06/30/2017   Liver cirrhosis secondary to NASH (HCC) 06/30/2017   Hypertension with renal disease 03/19/2017   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/19/2017   Tremor 08/06/2016   Vitreous hemorrhage of left eye (HCC) 01/29/2015   Diabetic macular edema (HCC) 12/25/2014   Hypertensive retinopathy of both eyes 12/25/2014   Cholecystitis 04/05/2013   Proliferative diabetic retinopathy (HCC) 08/04/2011   Bilateral nondiabetic proliferative retinopathy 04/03/2011   Type 2 diabetes mellitus with diabetic chronic kidney disease (HCC) 02/05/2010   CHEST PAIN 02/05/2010   Hypothyroidism 03/20/2008   HYPERLIPIDEMIA 03/20/2008   RENAL DISEASE, CHRONIC, STAGE II 03/20/2008   DEPRESSION 02/21/2008   PERIPHERAL NEUROPATHY 02/21/2008   Pseudophakia of both eyes 02/21/2008   GERD 02/21/2008    Orientation RESPIRATION BLADDER Height & Weight     Self, Situation, Time, Place  Normal Indwelling catheter Weight: 91.4  kg Height:  5\' 6"  (167.6 cm)  BEHAVIORAL SYMPTOMS/MOOD NEUROLOGICAL BOWEL NUTRITION STATUS      Continent Diet (Heart healthy/carb modified)  AMBULATORY STATUS COMMUNICATION OF NEEDS Skin   Extensive Assist Verbally Normal (Non pressure wound to lower right buttocks, small dime sized)                       Personal Care Assistance Level of Assistance  Feeding, Bathing, Dressing Bathing Assistance: Limited assistance Feeding assistance: Independent Dressing Assistance: Limited assistance     Functional Limitations Info  Sight, Hearing, Speech Sight Info: Adequate Hearing Info: Impaired Speech Info: Adequate    SPECIAL CARE FACTORS FREQUENCY  PT (By licensed PT), OT (By licensed OT)     PT Frequency: 5 times weekly OT Frequency: 5 times weekly            Contractures Contractures Info: Not present    Additional Factors Info  Code Status, Allergies Code Status Info: FULL Allergies Info: Ciprofloxacin, Crestor (Rosuvastatin), Lipitor (Atorvastatin), Lisinopril, Losartan Potassium, Lovastatin, Statins, Welchol (Colesevelam), Zetia (Ezetimibe), Dilaudid (Hydromorphone Hcl)           Current Medications (08/26/2022):  This is the current hospital active medication list Current Facility-Administered Medications  Medication Dose Route Frequency Provider Last Rate Last Admin   0.9 %  sodium chloride infusion   Intravenous Continuous Sherryll Burger, Pratik D, DO 100 mL/hr at 08/26/22 1256 New Bag at 08/26/22 1256   acetaminophen (TYLENOL) tablet 650 mg  650 mg Oral Q6H PRN Onnie Boer, MD       Or  acetaminophen (TYLENOL) suppository 650 mg  650 mg Rectal Q6H PRN Emokpae, Ejiroghene E, MD       allopurinol (ZYLOPRIM) tablet 100 mg  100 mg Oral QPM Emokpae, Ejiroghene E, MD   100 mg at 08/25/22 1806   Chlorhexidine Gluconate Cloth 2 % PADS 6 each  6 each Topical Daily Sherryll Burger, Pratik D, DO   6 each at 08/26/22 1021   insulin aspart (novoLOG) injection 0-5 Units  0-5 Units  Subcutaneous QHS Emokpae, Ejiroghene E, MD       insulin aspart (novoLOG) injection 0-9 Units  0-9 Units Subcutaneous TID WC Emokpae, Ejiroghene E, MD   1 Units at 08/26/22 1207   nystatin (MYCOSTATIN/NYSTOP) topical powder   Topical TID Onnie Boer, MD   Given at 08/26/22 1021   polyethylene glycol (MIRALAX / GLYCOLAX) packet 17 g  17 g Oral Daily PRN Emokpae, Ejiroghene E, MD       promethazine (PHENERGAN) tablet 12.5 mg  12.5 mg Oral Q6H PRN Emokpae, Ejiroghene E, MD         Discharge Medications: Please see discharge summary for a list of discharge medications.  Relevant Imaging Results:  Relevant Lab Results:   Additional Information SSN: [id number]  Leitha Bleak, RN

## 2022-08-26 NOTE — Progress Notes (Signed)
Patient ambulated to bathroom using FWW and standby assist. Up to chair for 1 hour. She complains of her coccyx hurting when she's sitting on it / lying on it. Donut ordered and patient used while lying on her back in bed and sitting up in the chair. She stated that the donut helps with her pain and when she is lying on her side.

## 2022-08-26 NOTE — Progress Notes (Signed)
PROGRESS NOTE    [name]  GNF:[id number] DOB: 12/05/1950 DOA: 08/25/2022 PCP: Benita Stabile, MD   Brief Narrative:    [name] is a 72 y.o. female with medical history significant for hypertension, diabetes mellitus, gout, NASH liver cirrhosis, CKD 3. Patient presented to the ED with complaints of generalized weakness, nausea, poor oral intake, 1 episode of vomiting today.  She was admitted with AKI on CKD stage III AA in the setting of recent Bactrim use.  She is noted to have some weakness and falls at home and requires PT/OT evaluation.  Assessment & Plan:   Principal Problem:   Acute kidney injury superimposed on chronic kidney disease (HCC) Active Problems:   Vaginal prolapse   Hypothyroidism   Type 2 diabetes mellitus with diabetic chronic kidney disease (HCC)   Hypertension with renal disease   Liver cirrhosis secondary to NASH (HCC)   Thrombocytopenia (HCC)   Prolonged QT interval  Assessment and Plan:  Acute kidney injury superimposed on chronic kidney disease (HCC) Creatinine elevated at 2.8, baseline 1.2-1.5.  CKD stage III A-B.  In the setting of poor oral intake, Bactrim., torsemide 20mg  BID. -Continue normal saline -Was to complete her last day of Bactrim today.  She has been given cefepime in the ED.   -UA with trace leukocytes, improved from prior UA, no fever no leukocytosis, she has had pelvic pain since pessaries were put in, hold off on antibiotics at this time.  No recent urine cultures on file.  -Follow-up blood cultures ordered in the ED with no growth noted thus far   Vaginal prolapse Follows with Dr. Despina Hidden. Please see last note 5/22.  Dr. Despina Hidden reached out to me via secure chat- patient has a huge vaginal/bladder prolapse prolapse, and foley was placed because she "kinks" off her urethra and has huge urinary retention. The pessary helps with this and she had a double pessary but it caused vaginal mucosal injury so now has just the one([place]).    Prolonged QT interval Qt- 513.  QTc prolonged, not new.  K- 3.8. - Check Mag.   Thrombocytopenia (HCC) Platelets 48, baseline.  Likely due to NASH liver cirrhosis.   Liver cirrhosis secondary to NASH (HCC) Stable.  With thrombocytopenia   Hypertension with renal disease Stable. -Resume nadolol, torsemide in a.m.   Type 2 diabetes mellitus with diabetic chronic kidney disease (HCC) She is on NovoLin  14u BID - SSI- S - HgbA1c   Hypothyroidism TSH low at 0.112.  She is on Synthroid -Considering low TSH, may need to discontinue Synthroid -Follow-up T3, T4 ordered in ED  Obesity BMI 32.52   DVT prophylaxis: SCDs Code Status: Full Family Communication: Daughter at bedside 5/29 Disposition Plan:  Status is: Observation The patient will require care spanning > 2 midnights and should be moved to inpatient because: Need for ongoing IV fluid   Consultants:  None  Procedures:  None  Antimicrobials:  Anti-infectives (From admission, onward)    Start     Dose/Rate Route Frequency Ordered Stop   08/25/22 1315  ceFEPIme (MAXIPIME) 2 g in sodium chloride 0.9 % 100 mL IVPB        2 g 200 mL/hr over 30 Minutes Intravenous  Once 08/25/22 1305 08/25/22 1544       Subjective: Patient seen and evaluated today with complaints of pain to her low back and tailbone region.  Daughter at bedside worried about recurrent falls.  Objective: Vitals:   08/25/22 1653 08/25/22  2042 08/26/22 0530 08/26/22 0908  BP: (!) 111/53 (!) 118/44 (!) 95/53 (!) 107/45  Pulse: (!) 59 (!) 57 (!) 56 (!) 56  Resp: 16 20 20 20   Temp: 97.7 F (36.5 C) (!) 97.5 F (36.4 C) 98 F (36.7 C) 98.5 F (36.9 C)  TempSrc: Oral Oral  Oral  SpO2: 100% 99% 97% 100%  Weight: 91.4 kg     Height: 5\' 6"  (1.676 m)       Intake/Output Summary (Last 24 hours) at 08/26/2022 1230 Last data filed at 08/26/2022 8119 Gross per 24 hour  Intake 460 ml  Output --  Net 460 ml   Filed Weights   08/25/22 1653  Weight:  91.4 kg    Examination:  General exam: Appears calm and comfortable  Respiratory system: Clear to auscultation. Respiratory effort normal. Cardiovascular system: S1 & S2 heard, RRR.  Gastrointestinal system: Abdomen is soft Central nervous system: Alert and awake Extremities: No edema Skin: No significant lesions noted Psychiatry: Flat affect.    Data Reviewed: I have personally reviewed following labs and imaging studies  CBC: Recent Labs  Lab 08/21/22 0012 08/25/22 1156 08/26/22 0431  WBC 9.0 5.4 4.3  NEUTROABS 7.5 4.1  --   HGB 11.8* 10.6* 9.4*  HCT 34.9* 31.7* 28.6*  MCV 98.6 97.8 99.0  PLT 60* 48* 41*   Basic Metabolic Panel: Recent Labs  Lab 08/21/22 0012 08/25/22 1156 08/26/22 0431  NA 137 134* 135  K 4.2 3.8 3.6  CL 103 104 106  CO2 24 18* 19*  GLUCOSE 123* 146* 139*  BUN 40* 54* 54*  CREATININE 2.33* 2.82* 2.65*  CALCIUM 7.7* 7.6* 7.4*  MG  --  2.3  --    GFR: Estimated Creatinine Clearance: 22.5 mL/min (A) (by C-G formula based on SCr of 2.65 mg/dL (H)). Liver Function Tests: Recent Labs  Lab 08/21/22 0012 08/25/22 1156  AST 33 30  ALT 19 16  ALKPHOS 122 102  BILITOT 1.7* 0.8  PROT 5.8* 5.2*  ALBUMIN 2.4* 2.2*   Recent Labs  Lab 08/21/22 0012  LIPASE 20   No results for input(s): "AMMONIA" in the last 168 hours. Coagulation Profile: No results for input(s): "INR", "PROTIME" in the last 168 hours. Cardiac Enzymes: No results for input(s): "CKTOTAL", "CKMB", "CKMBINDEX", "TROPONINI" in the last 168 hours. BNP (last 3 results) No results for input(s): "PROBNP" in the last 8760 hours. HbA1C: No results for input(s): "HGBA1C" in the last 72 hours. CBG: Recent Labs  Lab 08/20/22 2321 08/25/22 1659 08/25/22 2044 08/26/22 0759 08/26/22 1156  GLUCAP 140* 158* 176* 132* 150*   Lipid Profile: No results for input(s): "CHOL", "HDL", "LDLCALC", "TRIG", "CHOLHDL", "LDLDIRECT" in the last 72 hours. Thyroid Function Tests: Recent Labs     08/25/22 1208 08/25/22 1409  TSH 0.112*  --   FREET4  --  1.80*  T3FREE  --  1.4*   Anemia Panel: No results for input(s): "VITAMINB12", "FOLATE", "FERRITIN", "TIBC", "IRON", "RETICCTPCT" in the last 72 hours. Sepsis Labs: Recent Labs  Lab 08/25/22 1156 08/25/22 1409  LATICACIDVEN 1.7 1.5    Recent Results (from the past 240 hour(s))  Blood Culture (routine x 2)     Status: None (Preliminary result)   Collection Time: 08/25/22 11:56 AM   Specimen: BLOOD RIGHT HAND  Result Value Ref Range Status   Specimen Description BLOOD RIGHT HAND  Final   Special Requests   Final    BOTTLES DRAWN AEROBIC AND ANAEROBIC Blood Culture adequate  volume   Culture   Final    NO GROWTH < 24 HOURS Performed at [facility], [address], [place], [place] [address]    Report Status PENDING  Incomplete  Blood Culture (routine x 2)     Status: None (Preliminary result)   Collection Time: 08/25/22 12:08 PM   Specimen: BLOOD LEFT HAND  Result Value Ref Range Status   Specimen Description BLOOD LEFT HAND  Final   Special Requests   Final    BOTTLES DRAWN AEROBIC ONLY Blood Culture adequate volume   Culture   Final    NO GROWTH < 24 HOURS Performed at [facility], [address] [place], [place] [address]    Report Status PENDING  Incomplete         Radiology Studies: CT CHEST ABDOMEN PELVIS WO CONTRAST  Result Date: 08/25/2022 CLINICAL DATA:  Weakness, nausea and vomiting. Recent antibiotic treatment for UTI. Poly trauma, blunt. Fell today. EXAM: CT CHEST, ABDOMEN AND PELVIS WITHOUT CONTRAST TECHNIQUE: Multidetector CT imaging of the chest, abdomen and pelvis was performed following the standard protocol without IV contrast. RADIATION DOSE REDUCTION: This exam was performed according to the departmental dose-optimization program which includes automated exposure control, adjustment of the mA and/or kV according to patient size and/or use of iterative reconstruction technique. COMPARISON:   CT abdomen 08/20/2022 FINDINGS: CT CHEST FINDINGS Cardiovascular: Heart size is normal. There is coronary artery calcification and aortic atherosclerotic calcification. Mediastinum/Nodes: No mass or lymphadenopathy. Some varices are present. Lungs/Pleura: No pleural effusion. Mild chronic scarring at the lung bases left more than right. No evidence of pneumonia or mass lesion. Musculoskeletal: No thoracic region fracture. No evidence of rib or sternal fracture. CT ABDOMEN PELVIS FINDINGS Hepatobiliary: Advanced cirrhosis of the liver as seen previously. Previous cholecystectomy. Pancreas: Chronic pancreatic atrophy and calcifications. No acute process. Spleen: Splenomegaly suggesting portal venous hypertension. Adrenals/Urinary Tract: Adrenal glands are normal. The left is not well seen because of regional varices. No renal parenchymal abnormality. Catheter present in the bladder. Stomach/Bowel: Stomach and small intestine are unremarkable. No acute colon finding. Vascular/Lymphatic: Aortic atherosclerosis. No aneurysm. IVC is normal. No adenopathy. Portal hypertension associated varices. Reproductive: Pessary in place.  No pelvic mass. Other: Free fluid, normally distributed. No hyperdense fluid. No free air. Musculoskeletal: Old appearing compression deformities of the L4 vertebral body. No evidence of acute regional fracture. IMPRESSION: 1. No acute or traumatic finding in the chest, abdomen or pelvis. 2. Advanced cirrhosis of the liver with portal hypertension, splenomegaly, varices and ascites. 3. Aortic atherosclerosis. Coronary artery calcification. 4. Chronic pancreatic atrophy and calcifications. 5. Old appearing compression fracture of the L4 vertebral body. No evidence of acute regional fracture. Aortic Atherosclerosis (ICD10-I70.0). Electronically Signed   By: Paulina Fusi M.D.   On: 08/25/2022 12:52   CT Head Wo Contrast  Result Date: 08/25/2022 CLINICAL DATA:  Head trauma, minor. EXAM: CT HEAD  WITHOUT CONTRAST TECHNIQUE: Contiguous axial images were obtained from the base of the skull through the vertex without intravenous contrast. RADIATION DOSE REDUCTION: This exam was performed according to the departmental dose-optimization program which includes automated exposure control, adjustment of the mA and/or kV according to patient size and/or use of iterative reconstruction technique. COMPARISON:  Head CT 05/23/2017. FINDINGS: Brain: No acute intracranial hemorrhage. Gray-white differentiation is preserved. No hydrocephalus or extra-axial collection. No mass effect or midline shift. Vascular: No hyperdense vessel or unexpected calcification. Skull: No calvarial fracture or suspicious bone lesion. Skull base is unremarkable. Sinuses/Orbits: Chronic right  maxillary sinusitis. Orbits are unremarkable. Mastoids are well aerated. Other: None. IMPRESSION: No evidence of acute intracranial injury. Electronically Signed   By: Orvan Falconer M.D.   On: 08/25/2022 12:45   DG Knee 2 Views Right  Result Date: 08/25/2022 CLINICAL DATA:  Right knee pain since fall today about an hour ago EXAM: RIGHT KNEE - 1-2 VIEW COMPARISON:  None available FINDINGS: No fracture or dislocation. No acute soft tissue abnormality. Atherosclerotic changes seen throughout visualized arterial segments. Deformity of the proximal fibular diaphysis consistent with remote healed fracture. IMPRESSION: No acute abnormality of the right knee. Electronically Signed   By: Acquanetta Belling M.D.   On: 08/25/2022 12:19   DG Chest 1 View  Result Date: 08/25/2022 CLINICAL DATA:  Evaluation of sepsis EXAM: CHEST  1 VIEW COMPARISON:  Chest radiograph dated 07/16/2020 FINDINGS: Normal lung volumes. No focal consolidations. No pleural effusion or pneumothorax. The heart size and mediastinal contours are within normal limits. Cervical spinal fixation hardware appears intact. IMPRESSION: No active disease. Electronically Signed   By: Agustin Cree M.D.   On:  08/25/2022 11:36        Scheduled Meds:  allopurinol  100 mg Oral QPM   Chlorhexidine Gluconate Cloth  6 each Topical Daily   insulin aspart  0-5 Units Subcutaneous QHS   insulin aspart  0-9 Units Subcutaneous TID WC   nystatin   Topical TID     LOS: 0 days    Time spent: 35 minutes    Judee Hennick Hoover Brunette, DO [facility]  If 7PM-7AM, please contact night-coverage [email or web address] 08/26/2022, 12:30 PM

## 2022-08-27 DIAGNOSIS — N179 Acute kidney failure, unspecified: Secondary | ICD-10-CM | POA: Diagnosis not present

## 2022-08-27 DIAGNOSIS — N189 Chronic kidney disease, unspecified: Secondary | ICD-10-CM | POA: Diagnosis not present

## 2022-08-27 LAB — CBC
HCT: 31 % — ABNORMAL LOW (ref 36.0–46.0)
Hemoglobin: 10.2 g/dL — ABNORMAL LOW (ref 12.0–15.0)
MCH: 32.5 pg (ref 26.0–34.0)
MCHC: 32.9 g/dL (ref 30.0–36.0)
MCV: 98.7 fL (ref 80.0–100.0)
Platelets: 41 10*3/uL — ABNORMAL LOW (ref 150–400)
RBC: 3.14 MIL/uL — ABNORMAL LOW (ref 3.87–5.11)
RDW: 15.4 % (ref 11.5–15.5)
WBC: 4.7 10*3/uL (ref 4.0–10.5)
nRBC: 0 % (ref 0.0–0.2)

## 2022-08-27 LAB — GLUCOSE, CAPILLARY
Glucose-Capillary: 152 mg/dL — ABNORMAL HIGH (ref 70–99)
Glucose-Capillary: 190 mg/dL — ABNORMAL HIGH (ref 70–99)
Glucose-Capillary: 158 mg/dL — ABNORMAL HIGH (ref 70–99)
Glucose-Capillary: 177 mg/dL — ABNORMAL HIGH (ref 70–99)

## 2022-08-27 LAB — MAGNESIUM: Magnesium: 2.2 mg/dL (ref 1.7–2.4)

## 2022-08-27 LAB — HEMOGLOBIN A1C
Hgb A1c MFr Bld: 6 % — ABNORMAL HIGH (ref 4.8–5.6)
Mean Plasma Glucose: 126 mg/dL

## 2022-08-27 LAB — BASIC METABOLIC PANEL
Anion gap: 7 (ref 5–15)
BUN: 52 mg/dL — ABNORMAL HIGH (ref 8–23)
CO2: 21 mmol/L — ABNORMAL LOW (ref 22–32)
Calcium: 7.7 mg/dL — ABNORMAL LOW (ref 8.9–10.3)
Chloride: 106 mmol/L (ref 98–111)
Creatinine, Ser: 2.7 mg/dL — ABNORMAL HIGH (ref 0.44–1.00)
GFR, Estimated: 18 mL/min — ABNORMAL LOW (ref >60)
Glucose, Bld: 165 mg/dL — ABNORMAL HIGH (ref 70–99)
Potassium: 4.1 mmol/L (ref 3.5–5.1)
Sodium: 134 mmol/L — ABNORMAL LOW (ref 135–145)

## 2022-08-27 MED ORDER — SODIUM BICARBONATE 650 MG PO TABS
650.0000 mg | ORAL_TABLET | Freq: Two times a day (BID) | ORAL | Status: DC
  Administered 2022-08-27 – 2022-08-28 (×3): 650 mg via ORAL
  Filled 2022-08-27 (×3): qty 1

## 2022-08-27 MED ORDER — SODIUM CHLORIDE 0.9 % IV SOLN: INTRAVENOUS | Status: AC

## 2022-08-27 NOTE — Progress Notes (Signed)
Patient out of bed to chair today ,also patient using front wheel walker while ambulating in the room times one assist. Plan of care on going.

## 2022-08-27 NOTE — Progress Notes (Addendum)
Bladder scan was performed on patient to make sure she was not retaining,per Dr Sherryll Burger orders. Bladder scan showed no urine at this time MD notified,plan of care on going.

## 2022-08-27 NOTE — Progress Notes (Addendum)
Patient had some leaking around foley catheter,Dr Sherryll Burger notified .Foley catheter flushed  per MD's orders, was met with resistance when flushing Dr Sherryll Burger notified. The balloon of the foley  catheter was checked to see if it was still inflated,  I only received 5cc's of normal saline back into the syringe,the balloon was reinflated  with 10 ml's of normal saline to it's initial volume,patient tolerated procedure. Plan of care on going.

## 2022-08-27 NOTE — TOC Progression Note (Signed)
Transition of Care [facility]) - Progression Note    Patient Details  Name: [name] MRN: [id number] Date of Birth: 07-14-1950  [facility] CM/SW Contact  Leitha Bleak, RN Phone Number: 08/27/2022, 10:32 AM  Clinical Narrative:     Discussed bed offers with daughter, she is at the bedside. They are agreeing to [facility]. INS Auth started. Auth  ID [id number].   Expected Discharge Plan: Skilled Nursing Facility Barriers to Discharge: Continued Medical Work up  Expected Discharge Plan and Services     Social Determinants of Health (SDOH) Interventions SDOH Screenings   Food Insecurity: No Food Insecurity (08/25/2022)  Housing: Low Risk  (08/25/2022)  Transportation Needs: No Transportation Needs (08/25/2022)  Utilities: Not At Risk (08/25/2022)  Alcohol Screen: Low Risk  (10/30/2019)  Depression (PHQ2-9): Medium Risk (10/30/2019)  Financial Resource Strain: Medium Risk (10/30/2019)  Physical Activity: Inactive (10/30/2019)  Social Connections: Moderately Integrated (10/30/2019)  Stress: No Stress Concern Present (10/30/2019)  Tobacco Use: Low Risk  (08/25/2022)    Readmission Risk Interventions     No data to display

## 2022-08-27 NOTE — Plan of Care (Signed)
  Problem: Acute Rehab OT Goals (only OT should resolve) Goal: Pt. Will Perform Grooming Flowsheets (Taken 08/27/2022 1008) Pt Will Perform Grooming:  with modified independence  standing Goal: Pt. Will Perform Lower Body Bathing Flowsheets (Taken 08/27/2022 1008) Pt Will Perform Lower Body Bathing: with modified independence Goal: Pt. Will Perform Lower Body Dressing Flowsheets (Taken 08/27/2022 1008) Pt Will Perform Lower Body Dressing: with modified independence Goal: Pt. Will Transfer To Toilet Flowsheets (Taken 08/27/2022 1008) Pt Will Transfer to Toilet:  with modified independence  ambulating Goal: Pt/Caregiver Will Perform Home Exercise Program Flowsheets (Taken 08/27/2022 1008) Pt/caregiver will Perform Home Exercise Program:  Increased strength  Both right and left upper extremity  Independently  Shloka Baldridge OT, MOT

## 2022-08-27 NOTE — Evaluation (Signed)
Occupational Therapy Evaluation Patient Details Name: [name] MRN: [id number] DOB: 08/25/1950 Today's Date: 08/27/2022   History of Present Illness [name] is a 72 y.o. female with medical history significant for hypertension, diabetes mellitus, gout, NASH liver cirrhosis, CKD 3.  Patient presented to the ED with complaints of generalized weakness, nausea, poor oral intake, 1 episode of vomiting today.  She reports chronic dizziness but feels its worse. She had a pessary put in about a week ago, she also required a Foley placed due to urinary incontinence in the setting of the pessary. She reports part of the pessary has fallen out.  Has had persistent pelvic pain since placement of the pessary.  She was started on a course of Bactrim by her gynecologist.  She was in the ED 5 days ago- 5/24 with complaints of weakness, her Foley was changed in the ED, as she complained of urine draining around the catheter.  She was diagnosed with UTI, and was told to complete the course of her Bactrim that she was prescribed by her gynecologist.   Clinical Impression   Pt agreeable to OT evaluation. Pt was independent prior to admission to the hospital. Today pt required min A to boost from EOB and toilet with min G assist using RW to ambulate within the room and to the toilet. Pt is generally weak in increased weakness in R UE which is reportedly a baseline issue. Seated ADL's are done without physical assist other than set up assist. Pt is a fall rist at this time with 2 falls in the past 6 months. 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 Pt will benefit from continued OT in the hospital and recommended venue below to increase strength, balance, and endurance for safe ADL's.         Recommendations for follow up therapy are one component of a multi-disciplinary discharge planning process, led by the attending physician.  Recommendations may be updated based on patient status,  additional functional criteria and insurance authorization.   Assistance Recommended at Discharge Intermittent Supervision/Assistance  Patient can return home with the following A little help with walking and/or transfers;A little help with bathing/dressing/bathroom;Assistance with cooking/housework;Assist for transportation;Help with stairs or ramp for entrance    Functional Status Assessment  Patient has had a recent decline in their functional status and demonstrates the ability to make significant improvements in function in a reasonable and predictable amount of time.  Equipment Recommendations  None recommended by OT           Precautions / Restrictions Precautions Precautions: Fall Restrictions Weight Bearing Restrictions: No      Mobility Bed Mobility Overal bed mobility: Needs Assistance Bed Mobility: Supine to Sit     Supine to sit: Min assist, HOB elevated     General bed mobility comments: Assist to pull to EOB; labored movement. HOB elevated due to pt reporting sleeping in a recliner at home.    Transfers Overall transfer level: Needs assistance Equipment used: Rolling walker (2 wheels) Transfers: Sit to/from Stand, Bed to chair/wheelchair/BSC Sit to Stand: Min guard, Min assist     Step pivot transfers: Min guard     General transfer comment: unsteady labored movement      Balance Overall balance assessment: Needs assistance Sitting-balance support: Feet supported, No upper extremity supported Sitting balance-Leahy Scale: Good Sitting balance - Comments: seated at EOB   Standing balance support: During functional activity, Bilateral upper extremity supported Standing balance-Leahy Scale:  Fair Standing balance comment: using RW                           ADL either performed or assessed with clinical judgement   ADL Overall ADL's : Needs assistance/impaired     Grooming: Min guard;Standing;Wash/dry hands   Upper Body Bathing: Set  up;Sitting   Lower Body Bathing: Set up;Sitting/lateral leans   Upper Body Dressing : Set up;Sitting   Lower Body Dressing: Set up;Sitting/lateral leans   Toilet Transfer: Min guard;Minimal assistance;Rolling walker (2 wheels);Ambulation Toilet Transfer Details (indicate cue type and reason): Ambulated to toilet from chair and back; assist to boost from toilet with use of grab bar. Toileting- Clothing Manipulation and Hygiene: Modified independent;Sitting/lateral lean       Functional mobility during ADLs: Min guard;Rolling walker (2 wheels) General ADL Comments: Able to ambulate in the room with min G assist using RW.     Vision Baseline Vision/History: 1 Wears glasses;5 Retinopathy Ability to See in Adequate Light: 2 Moderately impaired Patient Visual Report: Other (comment) (Reports vision has gotten clearer.) Vision Assessment?: No apparent visual deficits Additional Comments: outside baseline issues                Pertinent Vitals/Pain Pain Assessment Pain Assessment: Faces Faces Pain Scale: Hurts little more Pain Location: stomach and back Pain Descriptors / Indicators: Squeezing Pain Intervention(s): Limited activity within patient's tolerance, Monitored during session, Repositioned     Hand Dominance Right   Extremity/Trunk Assessment Upper Extremity Assessment Upper Extremity Assessment: Generalized weakness;LUE deficits/detail (Increased weakness in L UE at baseline per pt report.) LUE Deficits / Details: 3+/5 shoulder flexion; reportedly weak at baseline.   Lower Extremity Assessment Lower Extremity Assessment: Defer to PT evaluation   Cervical / Trunk Assessment Cervical / Trunk Assessment: Normal   Communication Communication Communication: No difficulties   Cognition Arousal/Alertness: Awake/alert Behavior During Therapy: WFL for tasks assessed/performed Overall Cognitive Status: Within Functional Limits for tasks assessed                                                         Home Living Family/patient expects to be discharged to:: Private residence Living Arrangements: Alone Available Help at Discharge: Family;Available PRN/intermittently Type of Home: House Home Access: Stairs to enter [employer] of Steps: 2 Entrance Stairs-Rails: Right Home Layout: One level     Bathroom Shower/Tub: Chief Strategy Officer: Standard Bathroom Accessibility: No   Home Equipment: Rollator (4 wheels);Cane - single point;Shower seat          Prior Functioning/Environment Prior Level of Function : Independent/Modified Independent;Driving;History of Falls (last six months)             Mobility Comments: household and short distanced community ambulator without AD, drives ADLs Comments: Independent, sometimes have groceries delivered; 2 falls in past 6 months per pt's daughter. (per PT)        OT Problem List: Decreased strength;Decreased activity tolerance;Impaired balance (sitting and/or standing)      OT Treatment/Interventions: Self-care/ADL training;Therapeutic exercise;Therapeutic activities;Visual/perceptual remediation/compensation;Patient/family education;Balance training    OT Goals(Current goals can be found in the care plan section) Acute Rehab OT Goals Patient Stated Goal: Improve function. OT Goal Formulation: With patient Time For Goal Achievement: 09/10/22 Potential to Achieve Goals: Good  OT Frequency: Min 2X/week                                   End of Session Equipment Utilized During Treatment: Rolling walker (2 wheels)  Activity Tolerance: Patient tolerated treatment well Patient left: in chair;with call bell/phone within reach;with family/visitor present;with nursing/sitter in room  OT Visit Diagnosis: Unsteadiness on feet (R26.81);Other abnormalities of gait and mobility (R26.89);Muscle weakness (generalized) (M62.81);History of falling  (Z91.81)                Time: 1610-9604 OT Time Calculation (min): 22 min Charges:  OT General Charges $OT Visit: 1 Visit OT Evaluation $OT Eval Low Complexity: 1 Low  Alechia Lezama OT, MOT  Danie Chandler 08/27/2022, 10:05 AM

## 2022-08-27 NOTE — Progress Notes (Signed)
PROGRESS NOTE    [name]  ZOX:[id number] DOB: Nov 01, 1950 DOA: 08/25/2022 PCP: Benita Stabile, MD   Brief Narrative:    [name] is a 72 y.o. female with medical history significant for hypertension, diabetes mellitus, gout, NASH liver cirrhosis, CKD 3. Patient presented to the ED with complaints of generalized weakness, nausea, poor oral intake, 1 episode of vomiting today.  She was admitted with AKI on CKD stage III AA in the setting of recent Bactrim use.  She is noted to have some weakness and falls at home with PT/OT recommending SNF placement.  Assessment & Plan:   Principal Problem:   Acute kidney injury superimposed on chronic kidney disease (HCC) Active Problems:   Vaginal prolapse   Hypothyroidism   Type 2 diabetes mellitus with diabetic chronic kidney disease (HCC)   Hypertension with renal disease   Liver cirrhosis secondary to NASH (HCC)   Thrombocytopenia (HCC)   Prolonged QT interval   AKI (acute kidney injury) (HCC)  Assessment and Plan:  Acute kidney injury superimposed on chronic kidney disease (HCC) Creatinine elevated at 2.8, baseline 1.2-1.5.  CKD stage III A-B.  In the setting of poor oral intake, Bactrim., torsemide 20mg  BID. -Continue normal saline -Was to complete her last day of Bactrim today.  She has been given cefepime in the ED.   -UA with trace leukocytes, improved from prior UA, no fever no leukocytosis, she has had pelvic pain since pessaries were put in, hold off on antibiotics at this time.  No recent urine cultures on file.  -Follow-up blood cultures ordered in the ED with no growth noted thus far -Discussed case with nephrology who feels that patient is stable for discharge and can have repeat BMP in approximately 1 week   Vaginal prolapse Follows with Dr. Despina Hidden. Please see last note 5/22.  Dr. Despina Hidden reached out to me via secure chat- patient has a huge vaginal/bladder prolapse prolapse, and foley was placed because she "kinks" off her  urethra and has huge urinary retention. The pessary helps with this and she had a double pessary but it caused vaginal mucosal injury so now has just the one([place]).   Prolonged QT interval Qt- 513.  QTc prolonged, not new.  K- 3.8. - Check Mag.   Thrombocytopenia (HCC) Platelets 48, baseline.  Likely due to NASH liver cirrhosis.   Liver cirrhosis secondary to NASH (HCC) Stable.  With thrombocytopenia   Hypertension with renal disease Stable. -Resume nadolol, torsemide in a.m.   Type 2 diabetes mellitus with diabetic chronic kidney disease (HCC) She is on NovoLin  14u BID - SSI- S - HgbA1c 6.0%   Hypothyroidism TSH low at 0.112.  She is on Synthroid -Considering low TSH -T41.8 And T31.4, hold Synthroid for now  Obesity BMI 32.52   DVT prophylaxis: SCDs Code Status: Full Family Communication: Daughter at bedside 5/30 Disposition Plan:  Status is: Observation The patient will require care spanning > 2 midnights and should be moved to inpatient because: Need for ongoing IV fluid   Consultants:  None  Procedures:  None  Antimicrobials:  Anti-infectives (From admission, onward)    Start     Dose/Rate Route Frequency Ordered Stop   08/25/22 1315  ceFEPIme (MAXIPIME) 2 g in sodium chloride 0.9 % 100 mL IVPB        2 g 200 mL/hr over 30 Minutes Intravenous  Once 08/25/22 1305 08/25/22 1544       Subjective: Patient seen and evaluated today and  appears to be overall doing well and is having breakfast.  She is agreeable to SNF placement.  Objective: Vitals:   08/26/22 2033 08/27/22 0451 08/27/22 0819 08/27/22 1325  BP: (!) 117/43 (!) 108/44 106/65 (!) 108/38  Pulse: (!) 59 (!) 57 (!) 55 (!) 52  Resp: 18 18 19 18   Temp: 97.8 F (36.6 C) (!) 97.5 F (36.4 C) 97.8 F (36.6 C) 97.6 F (36.4 C)  TempSrc: Oral Oral Oral Oral  SpO2: 100% 98% 99% 98%  Weight:      Height:        Intake/Output Summary (Last 24 hours) at 08/27/2022 1544 Last data filed at  08/27/2022 1326 Gross per 24 hour  Intake 801.91 ml  Output 750 ml  Net 51.91 ml   Filed Weights   08/25/22 1653  Weight: 91.4 kg    Examination:  General exam: Appears calm and comfortable  Respiratory system: Clear to auscultation. Respiratory effort normal. Cardiovascular system: S1 & S2 heard, RRR.  Gastrointestinal system: Abdomen is soft Central nervous system: Alert and awake Extremities: No edema Skin: No significant lesions noted Psychiatry: Flat affect. Foley with clear, yellow urine output    Data Reviewed: I have personally reviewed following labs and imaging studies  CBC: Recent Labs  Lab 08/21/22 0012 08/25/22 1156 08/26/22 0431 08/27/22 0442  WBC 9.0 5.4 4.3 4.7  NEUTROABS 7.5 4.1  --   --   HGB 11.8* 10.6* 9.4* 10.2*  HCT 34.9* 31.7* 28.6* 31.0*  MCV 98.6 97.8 99.0 98.7  PLT 60* 48* 41* 41*   Basic Metabolic Panel: Recent Labs  Lab 08/21/22 0012 08/25/22 1156 08/26/22 0431 08/27/22 0442  NA 137 134* 135 134*  K 4.2 3.8 3.6 4.1  CL 103 104 106 106  CO2 24 18* 19* 21*  GLUCOSE 123* 146* 139* 165*  BUN 40* 54* 54* 52*  CREATININE 2.33* 2.82* 2.65* 2.70*  CALCIUM 7.7* 7.6* 7.4* 7.7*  MG  --  2.3  --  2.2   GFR: Estimated Creatinine Clearance: 22.1 mL/min (A) (by C-G formula based on SCr of 2.7 mg/dL (H)). Liver Function Tests: Recent Labs  Lab 08/21/22 0012 08/25/22 1156  AST 33 30  ALT 19 16  ALKPHOS 122 102  BILITOT 1.7* 0.8  PROT 5.8* 5.2*  ALBUMIN 2.4* 2.2*   Recent Labs  Lab 08/21/22 0012  LIPASE 20   No results for input(s): "AMMONIA" in the last 168 hours. Coagulation Profile: No results for input(s): "INR", "PROTIME" in the last 168 hours. Cardiac Enzymes: No results for input(s): "CKTOTAL", "CKMB", "CKMBINDEX", "TROPONINI" in the last 168 hours. BNP (last 3 results) No results for input(s): "PROBNP" in the last 8760 hours. HbA1C: Recent Labs    08/25/22 1154  HGBA1C 6.0*   CBG: Recent Labs  Lab  08/26/22 1156 08/26/22 1645 08/26/22 2136 08/27/22 0726 08/27/22 1104  GLUCAP 150* 180* 172* 152* 190*   Lipid Profile: No results for input(s): "CHOL", "HDL", "LDLCALC", "TRIG", "CHOLHDL", "LDLDIRECT" in the last 72 hours. Thyroid Function Tests: Recent Labs    08/25/22 1208 08/25/22 1409  TSH 0.112*  --   FREET4  --  1.80*  T3FREE  --  1.4*   Anemia Panel: No results for input(s): "VITAMINB12", "FOLATE", "FERRITIN", "TIBC", "IRON", "RETICCTPCT" in the last 72 hours. Sepsis Labs: Recent Labs  Lab 08/25/22 1156 08/25/22 1409  LATICACIDVEN 1.7 1.5    Recent Results (from the past 240 hour(s))  Blood Culture (routine x 2)  Status: None (Preliminary result)   Collection Time: 08/25/22 11:56 AM   Specimen: BLOOD RIGHT HAND  Result Value Ref Range Status   Specimen Description BLOOD RIGHT HAND  Final   Special Requests   Final    BOTTLES DRAWN AEROBIC AND ANAEROBIC Blood Culture adequate volume   Culture   Final    NO GROWTH 2 DAYS Performed at [facility], [address], [place], [place] [address]    Report Status PENDING  Incomplete  Blood Culture (routine x 2)     Status: None (Preliminary result)   Collection Time: 08/25/22 12:08 PM   Specimen: BLOOD LEFT HAND  Result Value Ref Range Status   Specimen Description BLOOD LEFT HAND  Final   Special Requests   Final    BOTTLES DRAWN AEROBIC ONLY Blood Culture adequate volume   Culture   Final    NO GROWTH 2 DAYS Performed at [facility], [address], [place], [place] [address]    Report Status PENDING  Incomplete         Radiology Studies: No results found.      Scheduled Meds:  allopurinol  100 mg Oral QPM   Chlorhexidine Gluconate Cloth  6 each Topical Daily   insulin aspart  0-5 Units Subcutaneous QHS   insulin aspart  0-9 Units Subcutaneous TID WC   nystatin   Topical TID   sodium bicarbonate  650 mg Oral BID     LOS: 1 day    Time spent: 35 minutes    Brandy Smitherman Hoover Brunette, DO [facility]  If 7PM-7AM, please contact night-coverage [email or web address] 08/27/2022, 3:44 PM

## 2022-08-28 ENCOUNTER — Telehealth: Payer: Self-pay | Admitting: Obstetrics & Gynecology

## 2022-08-28 DIAGNOSIS — D631 Anemia in chronic kidney disease: Secondary | ICD-10-CM | POA: Diagnosis not present

## 2022-08-28 DIAGNOSIS — I700 Atherosclerosis of aorta: Secondary | ICD-10-CM | POA: Diagnosis not present

## 2022-08-28 DIAGNOSIS — Z6832 Body mass index (BMI) 32.0-32.9, adult: Secondary | ICD-10-CM | POA: Diagnosis not present

## 2022-08-28 DIAGNOSIS — E785 Hyperlipidemia, unspecified: Secondary | ICD-10-CM | POA: Diagnosis not present

## 2022-08-28 DIAGNOSIS — T83018A Breakdown (mechanical) of other indwelling urethral catheter, initial encounter: Secondary | ICD-10-CM | POA: Diagnosis not present

## 2022-08-28 DIAGNOSIS — R001 Bradycardia, unspecified: Secondary | ICD-10-CM | POA: Diagnosis not present

## 2022-08-28 DIAGNOSIS — E11319 Type 2 diabetes mellitus with unspecified diabetic retinopathy without macular edema: Secondary | ICD-10-CM | POA: Diagnosis not present

## 2022-08-28 DIAGNOSIS — Z66 Do not resuscitate: Secondary | ICD-10-CM | POA: Diagnosis not present

## 2022-08-28 DIAGNOSIS — N139 Obstructive and reflux uropathy, unspecified: Secondary | ICD-10-CM | POA: Diagnosis not present

## 2022-08-28 DIAGNOSIS — R5381 Other malaise: Secondary | ICD-10-CM | POA: Diagnosis not present

## 2022-08-28 DIAGNOSIS — Z7189 Other specified counseling: Secondary | ICD-10-CM | POA: Diagnosis not present

## 2022-08-28 DIAGNOSIS — R2681 Unsteadiness on feet: Secondary | ICD-10-CM | POA: Diagnosis not present

## 2022-08-28 DIAGNOSIS — R9431 Abnormal electrocardiogram [ECG] [EKG]: Secondary | ICD-10-CM | POA: Diagnosis not present

## 2022-08-28 DIAGNOSIS — E872 Acidosis, unspecified: Secondary | ICD-10-CM | POA: Diagnosis not present

## 2022-08-28 DIAGNOSIS — E1142 Type 2 diabetes mellitus with diabetic polyneuropathy: Secondary | ICD-10-CM | POA: Diagnosis not present

## 2022-08-28 DIAGNOSIS — I5031 Acute diastolic (congestive) heart failure: Secondary | ICD-10-CM | POA: Diagnosis not present

## 2022-08-28 DIAGNOSIS — R7989 Other specified abnormal findings of blood chemistry: Secondary | ICD-10-CM | POA: Diagnosis not present

## 2022-08-28 DIAGNOSIS — E1165 Type 2 diabetes mellitus with hyperglycemia: Secondary | ICD-10-CM | POA: Diagnosis not present

## 2022-08-28 DIAGNOSIS — R279 Unspecified lack of coordination: Secondary | ICD-10-CM | POA: Diagnosis not present

## 2022-08-28 DIAGNOSIS — Y732 Prosthetic and other implants, materials and accessory gastroenterology and urology devices associated with adverse incidents: Secondary | ICD-10-CM | POA: Diagnosis present

## 2022-08-28 DIAGNOSIS — M6281 Muscle weakness (generalized): Secondary | ICD-10-CM | POA: Diagnosis not present

## 2022-08-28 DIAGNOSIS — K7460 Unspecified cirrhosis of liver: Secondary | ICD-10-CM | POA: Diagnosis not present

## 2022-08-28 DIAGNOSIS — N1832 Chronic kidney disease, stage 3b: Secondary | ICD-10-CM | POA: Diagnosis not present

## 2022-08-28 DIAGNOSIS — E871 Hypo-osmolality and hyponatremia: Secondary | ICD-10-CM | POA: Diagnosis not present

## 2022-08-28 DIAGNOSIS — E1122 Type 2 diabetes mellitus with diabetic chronic kidney disease: Secondary | ICD-10-CM | POA: Diagnosis not present

## 2022-08-28 DIAGNOSIS — K7581 Nonalcoholic steatohepatitis (NASH): Secondary | ICD-10-CM | POA: Diagnosis not present

## 2022-08-28 DIAGNOSIS — Q794 Prune belly syndrome: Secondary | ICD-10-CM | POA: Diagnosis not present

## 2022-08-28 DIAGNOSIS — E7800 Pure hypercholesterolemia, unspecified: Secondary | ICD-10-CM | POA: Diagnosis not present

## 2022-08-28 DIAGNOSIS — D696 Thrombocytopenia, unspecified: Secondary | ICD-10-CM | POA: Diagnosis not present

## 2022-08-28 DIAGNOSIS — N179 Acute kidney failure, unspecified: Secondary | ICD-10-CM | POA: Diagnosis not present

## 2022-08-28 DIAGNOSIS — E039 Hypothyroidism, unspecified: Secondary | ICD-10-CM | POA: Diagnosis not present

## 2022-08-28 DIAGNOSIS — Z794 Long term (current) use of insulin: Secondary | ICD-10-CM | POA: Diagnosis not present

## 2022-08-28 DIAGNOSIS — K767 Hepatorenal syndrome: Secondary | ICD-10-CM | POA: Diagnosis not present

## 2022-08-28 DIAGNOSIS — R2689 Other abnormalities of gait and mobility: Secondary | ICD-10-CM | POA: Diagnosis not present

## 2022-08-28 DIAGNOSIS — R600 Localized edema: Secondary | ICD-10-CM | POA: Diagnosis not present

## 2022-08-28 DIAGNOSIS — R1319 Other dysphagia: Secondary | ICD-10-CM | POA: Diagnosis not present

## 2022-08-28 DIAGNOSIS — Q211 Atrial septal defect, unspecified: Secondary | ICD-10-CM | POA: Diagnosis not present

## 2022-08-28 DIAGNOSIS — I10 Essential (primary) hypertension: Secondary | ICD-10-CM | POA: Diagnosis not present

## 2022-08-28 DIAGNOSIS — D61818 Other pancytopenia: Secondary | ICD-10-CM | POA: Diagnosis not present

## 2022-08-28 DIAGNOSIS — N189 Chronic kidney disease, unspecified: Secondary | ICD-10-CM | POA: Diagnosis not present

## 2022-08-28 DIAGNOSIS — I129 Hypertensive chronic kidney disease with stage 1 through stage 4 chronic kidney disease, or unspecified chronic kidney disease: Secondary | ICD-10-CM | POA: Diagnosis not present

## 2022-08-28 DIAGNOSIS — E46 Unspecified protein-calorie malnutrition: Secondary | ICD-10-CM | POA: Diagnosis not present

## 2022-08-28 DIAGNOSIS — R34 Anuria and oliguria: Secondary | ICD-10-CM | POA: Diagnosis not present

## 2022-08-28 DIAGNOSIS — Z515 Encounter for palliative care: Secondary | ICD-10-CM | POA: Diagnosis not present

## 2022-08-28 DIAGNOSIS — E8809 Other disorders of plasma-protein metabolism, not elsewhere classified: Secondary | ICD-10-CM | POA: Diagnosis not present

## 2022-08-28 DIAGNOSIS — R339 Retention of urine, unspecified: Secondary | ICD-10-CM | POA: Diagnosis not present

## 2022-08-28 DIAGNOSIS — R188 Other ascites: Secondary | ICD-10-CM | POA: Diagnosis not present

## 2022-08-28 DIAGNOSIS — E440 Moderate protein-calorie malnutrition: Secondary | ICD-10-CM | POA: Diagnosis not present

## 2022-08-28 LAB — BASIC METABOLIC PANEL
Anion gap: 7 (ref 5–15)
BUN: 51 mg/dL — ABNORMAL HIGH (ref 8–23)
CO2: 20 mmol/L — ABNORMAL LOW (ref 22–32)
Calcium: 7.8 mg/dL — ABNORMAL LOW (ref 8.9–10.3)
Chloride: 107 mmol/L (ref 98–111)
Creatinine, Ser: 2.54 mg/dL — ABNORMAL HIGH (ref 0.44–1.00)
GFR, Estimated: 20 mL/min — ABNORMAL LOW (ref >60)
Glucose, Bld: 139 mg/dL — ABNORMAL HIGH (ref 70–99)
Potassium: 3.7 mmol/L (ref 3.5–5.1)
Sodium: 134 mmol/L — ABNORMAL LOW (ref 135–145)

## 2022-08-28 LAB — GLUCOSE, CAPILLARY: Glucose-Capillary: 144 mg/dL — ABNORMAL HIGH (ref 70–99)

## 2022-08-28 LAB — CULTURE, BLOOD (ROUTINE X 2): Culture: NO GROWTH

## 2022-08-28 LAB — MAGNESIUM: Magnesium: 2.2 mg/dL (ref 1.7–2.4)

## 2022-08-28 MED ORDER — SODIUM BICARBONATE 650 MG PO TABS: 650.0000 mg | ORAL_TABLET | Freq: Two times a day (BID) | ORAL | 0 refills | Status: DC

## 2022-08-28 NOTE — Telephone Encounter (Signed)
Pt is requesting a call about her current condition with her bladder.

## 2022-08-28 NOTE — Progress Notes (Signed)
Occupational Therapy Treatment Patient Details Name: [name] MRN: [id number] DOB: 1950/09/20 Today's Date: 08/28/2022   History of present illness [name] is a 72 y.o. female with medical history significant for hypertension, diabetes mellitus, gout, NASH liver cirrhosis, CKD 3.  Patient presented to the ED with complaints of generalized weakness, nausea, poor oral intake, 1 episode of vomiting today.  She reports chronic dizziness but feels its worse. She had a pessary put in about a week ago, she also required a Foley placed due to urinary incontinence in the setting of the pessary. She reports part of the pessary has fallen out.  Has had persistent pelvic pain since placement of the pessary.  She was started on a course of Bactrim by her gynecologist.  She was in the ED 5 days ago- 5/24 with complaints of weakness, her Foley was changed in the ED, as she complained of urine draining around the catheter.  She was diagnosed with UTI, and was told to complete the course of her Bactrim that she was prescribed by her gynecologist.   OT comments  Pt was up in bed upon OT arrival. She was cooperative and participated well in OT session. Pt completed sit to stand t/f with use of RW and min guard. Ambulated to bathroom where she demonstrated ability to t/f to toilet with use of RW/grab bars & SBA for safety. She was able to wash LE seated with lateral leans. Pt indwelling catheter was leaking throughout session, RN aware. At end of session pt t/f to chair with use of RW and CGA. Pt left in chair with chair alarm set and nurse updated. Pt d/c recommendations remain appropriate.    Recommendations for follow up therapy are one component of a multi-disciplinary discharge planning process, led by the attending physician.  Recommendations may be updated based on patient status, additional functional criteria and insurance authorization.    Assistance Recommended at Discharge Intermittent  Supervision/Assistance  Patient can return home with the following  A little help with walking and/or transfers;A little help with bathing/dressing/bathroom;Assistance with cooking/housework;Assist for transportation;Help with stairs or ramp for entrance   Equipment Recommendations  None recommended by OT       Precautions / Restrictions Precautions Precautions: Fall Restrictions Weight Bearing Restrictions: No       Mobility Bed Mobility Overal bed mobility: Needs Assistance Bed Mobility: Supine to Sit     Supine to sit: HOB elevated     General bed mobility comments: HOB elevated, increased time    Transfers Overall transfer level: Needs assistance Equipment used: Rolling walker (2 wheels) Transfers: Sit to/from Stand, Bed to chair/wheelchair/BSC Sit to Stand: Min guard, Min assist     Step pivot transfers: Min guard     General transfer comment: unsteady labored movement     Balance Overall balance assessment: Needs assistance Sitting-balance support: Feet supported, No upper extremity supported Sitting balance-Leahy Scale: Good Sitting balance - Comments: seated at EOB                                   ADL either performed or assessed with clinical judgement   ADL               Lower Body Bathing: Set up;Sitting/lateral leans  Extremity/Trunk Assessment Upper Extremity Assessment Upper Extremity Assessment: Overall WFL for tasks assessed                              Cognition Arousal/Alertness: Awake/alert Behavior During Therapy: WFL for tasks assessed/performed Overall Cognitive Status: Within Functional Limits for tasks assessed                                                     Pertinent Vitals/ Pain       Pain Assessment Pain Assessment: No/denies pain         Frequency  Min 2X/week        Progress Toward Goals  OT Goals(current goals  can now be found in the care plan section)  Progress towards OT goals: Progressing toward goals  Acute Rehab OT Goals OT Goal Formulation: With patient Time For Goal Achievement: 09/10/22 Potential to Achieve Goals: Good ADL Goals Pt Will Perform Grooming: with modified independence;standing Pt Will Perform Lower Body Bathing: with modified independence Pt Will Perform Lower Body Dressing: with modified independence Pt Will Transfer to Toilet: with modified independence;ambulating Pt/caregiver will Perform Home Exercise Program: Increased strength;Both right and left upper extremity;Independently  Plan Discharge plan remains appropriate          End of Session Equipment Utilized During Treatment: Rolling walker (2 wheels)  OT Visit Diagnosis: Unsteadiness on feet (R26.81);Other abnormalities of gait and mobility (R26.89);Muscle weakness (generalized) (M62.81);History of falling (Z91.81)   Activity Tolerance Patient tolerated treatment well   Patient Left in chair;with call bell/phone within reach;with chair alarm set   Nurse Communication Mobility status        Time: 6045-4098 OT Time Calculation (min): 20 min  Charges: OT General Charges $OT Visit: 1 Visit OT Treatments $Self Care/Home Management : 8-22 mins    Bevelyn Ngo, OTR/L 08/28/2022, 8:59 AM

## 2022-08-28 NOTE — TOC Transition Note (Signed)
Transition of Care [facility]) - CM/SW Discharge Note   Patient Details  Name: [name] MRN: [id number] Date of Birth: Jul 13, 1950  Transition of Care [facility]) CM/SW Contact:  Leitha Bleak, RN Phone Number: 08/28/2022, 9:09 AM   Clinical Narrative:   Insurance received [id number] ID # [id number] update due 08/31/22. Revonda Standard provided a room number, RN to call report. TOC will call EMS when RN is ready.   Final next level of care: Skilled Nursing Facility Barriers to Discharge: Barriers Resolved   Patient Goals and CMS Choice CMS Medicare.gov Compare Post Acute Care list provided to:: Patient Choice offered to / list presented to : Patient  Discharge Placement                  Patient to be transferred to facility by: EMS   Patient and family notified of of transfer: 08/28/22  Discharge Plan and Services Additional resources added to the After Visit Summary for      Social Determinants of Health (SDOH) Interventions SDOH Screenings   Food Insecurity: No Food Insecurity (08/25/2022)  Housing: Low Risk  (08/25/2022)  Transportation Needs: No Transportation Needs (08/25/2022)  Utilities: Not At Risk (08/25/2022)  Alcohol Screen: Low Risk  (10/30/2019)  Depression (PHQ2-9): Medium Risk (10/30/2019)  Financial Resource Strain: Medium Risk (10/30/2019)  Physical Activity: Inactive (10/30/2019)  Social Connections: Moderately Integrated (10/30/2019)  Stress: No Stress Concern Present (10/30/2019)  Tobacco Use: Low Risk  (08/25/2022)     Readmission Risk Interventions     No data to display

## 2022-08-28 NOTE — Care Management Important Message (Signed)
Important Message  Patient Details  Name: [name] MRN: [id number] Date of Birth: 12-07-1950   Medicare Important Message Given:  N/A - LOS <3 / Initial given by admissions     Corey Harold 08/28/2022, 10:30 AM

## 2022-08-28 NOTE — Discharge Summary (Addendum)
Physician Discharge Summary  KATRINE BEYE ZHY:[id number] DOB: 1950-05-16 DOA: 08/25/2022  PCP: Benita Stabile, MD  Admit date: 08/25/2022  Discharge date: 08/28/2022  Admitted From:Home  Disposition:  SNF  Recommendations for Outpatient Follow-up:  Follow up with PCP in 1-2 weeks and follow-up BMP in 1 week Follow-up with nephrology Dr. Wolfgang Phoenix outpatient with referral sent to follow-up regarding repeat BMP and evaluation of CKD Continue sodium bicarbonate twice daily as prescribed Continue to avoid use of Bactrim and torsemide at this time Hold further use of Synthroid and follow-up TSH with PCP Continue Foley catheter and follow-up with gynecology Dr. Despina Hidden as scheduled  Home Health: None  Equipment/Devices: None  Discharge Condition:Stable  CODE STATUS: Full  Diet recommendation: Heart Healthy/carb modified  Brief/Interim Summary:  [name] is a 72 y.o. female with medical history significant for hypertension, diabetes mellitus, gout, NASH liver cirrhosis, CKD 3. Patient presented to the ED with complaints of generalized weakness, nausea, poor oral intake, 1 episode of vomiting today.  She was admitted with AKI on CKD stage III AA in the setting of recent Bactrim use.  She is noted to have some weakness and falls at home with PT/OT recommending SNF placement.  She is noted to have some slight improvements in her creatinine levels, however after discussion with nephrology this will likely take up to a week or more for full recovery.  She continues to have good urine output and no other significant symptoms at this point.  She is stable for discharge to SNF and will need outpatient follow-up with nephrology as scheduled above.  Her Synthroid dose has also been held due to low TSH levels noted as well as elevated free T4.  No other acute events noted throughout the course of this admission and she is stable for discharge to SNF as recommended by PT.  Discharge Diagnoses:  Principal  Problem:   Acute kidney injury superimposed on chronic kidney disease (HCC) Active Problems:   Vaginal prolapse   Hypothyroidism   Type 2 diabetes mellitus with diabetic chronic kidney disease (HCC)   Hypertension with renal disease   Liver cirrhosis secondary to NASH (HCC)   Thrombocytopenia (HCC)   Prolonged QT interval   AKI (acute kidney injury) (HCC)  Principal discharge diagnosis: AKI on CKD stage IV in the setting of poor oral intake, torsemide use, and recent Bactrim use.  Discharge Instructions  Discharge Instructions     Ambulatory referral to Nephrology   Complete by: As directed    Diet - low sodium heart healthy   Complete by: As directed    Increase activity slowly   Complete by: As directed    No wound care   Complete by: As directed       Allergies as of 08/28/2022       Reactions   Ciprofloxacin     severe yeast infection   Crestor [rosuvastatin]    arthralgia/myalgia   Lipitor [atorvastatin]    arthralgia/myalgia   Lisinopril Cough   Losartan Potassium Cough   Lovastatin    arthralgia/myalgia   Statins Other (See Comments)   arthralgia/myalgia   Welchol [colesevelam]    arthralgia/myalgia   Zetia [ezetimibe]    arthralgia/myalgia   Dilaudid [hydromorphone Hcl] Nausea And Vomiting   After one vomiting episode no further vomiting        Medication List     STOP taking these medications    levothyroxine 137 MCG tablet Commonly known as: SYNTHROID   sulfamethoxazole-trimethoprim  800-160 MG tablet Commonly known as: BACTRIM DS   torsemide 20 MG tablet Commonly known as: DEMADEX       TAKE these medications    allopurinol 100 MG tablet Commonly known as: ZYLOPRIM Take 100 mg by mouth every evening.   insulin regular 100 units/mL injection Commonly known as: NOVOLIN R Inject 5-8 Units into the skin 2 (two) times daily before a meal.   nadolol 40 MG tablet Commonly known as: CORGARD Take 40 mg by mouth daily.    neomycin-polymyxin b-dexamethasone 3.5-10000-0.1 Oint Commonly known as: MAXITROL Place into the right eye 2 (two) times daily.   NovoLIN N ReliOn 100 UNIT/ML injection Generic drug: insulin NPH Human Inject 14 Units into the skin 2 (two) times daily.   OneTouch Ultra test strip Generic drug: glucose blood   sodium bicarbonate 650 MG tablet Take 1 tablet (650 mg total) by mouth 2 (two) times daily.   Vitamin D (Ergocalciferol) 1.25 MG (50000 UNIT) Caps capsule Commonly known as: DRISDOL Take 50,000 Units by mouth every Saturday.   Vitamin D 125 MCG (5000 UT) Caps Take 5,000 Units by mouth every Wednesday.   Vitamin D-3 125 MCG (5000 UT) Tabs Take 1 tablet by mouth every Wednesday.        Contact information for follow-up providers     Benita Stabile, MD. Schedule an appointment as soon as possible for a visit in 1 week(s).   Specialty: Internal Medicine Contact information: [address] [place] [facility] [address] [phone]         Randa Lynn, MD. Schedule an appointment as soon as possible for a visit.   Specialty: Nephrology Contact information: [address] [place] [address] [phone]              Contact information for after-discharge care     Destination     [facility] .   Service: Skilled Nursing Contact information: [address] [place] [address] [phone]                    Allergies  Allergen Reactions   Ciprofloxacin      severe yeast infection   Crestor [Rosuvastatin]     arthralgia/myalgia   Lipitor [Atorvastatin]     arthralgia/myalgia   Lisinopril Cough   Losartan Potassium Cough   Lovastatin     arthralgia/myalgia   Statins Other (See Comments)    arthralgia/myalgia   Welchol [Colesevelam]     arthralgia/myalgia   Zetia [Ezetimibe]     arthralgia/myalgia   Dilaudid [Hydromorphone Hcl] Nausea And Vomiting    After one vomiting episode  no further vomiting    Consultations: None   Procedures/Studies: CT CHEST ABDOMEN PELVIS WO CONTRAST  Result Date: 08/25/2022 CLINICAL DATA:  Weakness, nausea and vomiting. Recent antibiotic treatment for UTI. Poly trauma, blunt. Fell today. EXAM: CT CHEST, ABDOMEN AND PELVIS WITHOUT CONTRAST TECHNIQUE: Multidetector CT imaging of the chest, abdomen and pelvis was performed following the standard protocol without IV contrast. RADIATION DOSE REDUCTION: This exam was performed according to the departmental dose-optimization program which includes automated exposure control, adjustment of the mA and/or kV according to patient size and/or use of iterative reconstruction technique. COMPARISON:  CT abdomen 08/20/2022 FINDINGS: CT CHEST FINDINGS Cardiovascular: Heart size is normal. There is coronary artery calcification and aortic atherosclerotic calcification. Mediastinum/Nodes: No mass or lymphadenopathy. Some varices are present. Lungs/Pleura: No pleural effusion. Mild chronic scarring at the lung bases  left more than right. No evidence of pneumonia or mass lesion. Musculoskeletal: No thoracic region fracture. No evidence of rib or sternal fracture. CT ABDOMEN PELVIS FINDINGS Hepatobiliary: Advanced cirrhosis of the liver as seen previously. Previous cholecystectomy. Pancreas: Chronic pancreatic atrophy and calcifications. No acute process. Spleen: Splenomegaly suggesting portal venous hypertension. Adrenals/Urinary Tract: Adrenal glands are normal. The left is not well seen because of regional varices. No renal parenchymal abnormality. Catheter present in the bladder. Stomach/Bowel: Stomach and small intestine are unremarkable. No acute colon finding. Vascular/Lymphatic: Aortic atherosclerosis. No aneurysm. IVC is normal. No adenopathy. Portal hypertension associated varices. Reproductive: Pessary in place.  No pelvic mass. Other: Free fluid, normally distributed. No hyperdense fluid. No free air.  Musculoskeletal: Old appearing compression deformities of the L4 vertebral body. No evidence of acute regional fracture. IMPRESSION: 1. No acute or traumatic finding in the chest, abdomen or pelvis. 2. Advanced cirrhosis of the liver with portal hypertension, splenomegaly, varices and ascites. 3. Aortic atherosclerosis. Coronary artery calcification. 4. Chronic pancreatic atrophy and calcifications. 5. Old appearing compression fracture of the L4 vertebral body. No evidence of acute regional fracture. Aortic Atherosclerosis (ICD10-I70.0). Electronically Signed   By: Paulina Fusi M.D.   On: 08/25/2022 12:52   CT Head Wo Contrast  Result Date: 08/25/2022 CLINICAL DATA:  Head trauma, minor. EXAM: CT HEAD WITHOUT CONTRAST TECHNIQUE: Contiguous axial images were obtained from the base of the skull through the vertex without intravenous contrast. RADIATION DOSE REDUCTION: This exam was performed according to the departmental dose-optimization program which includes automated exposure control, adjustment of the mA and/or kV according to patient size and/or use of iterative reconstruction technique. COMPARISON:  Head CT 05/23/2017. FINDINGS: Brain: No acute intracranial hemorrhage. Gray-white differentiation is preserved. No hydrocephalus or extra-axial collection. No mass effect or midline shift. Vascular: No hyperdense vessel or unexpected calcification. Skull: No calvarial fracture or suspicious bone lesion. Skull base is unremarkable. Sinuses/Orbits: Chronic right maxillary sinusitis. Orbits are unremarkable. Mastoids are well aerated. Other: None. IMPRESSION: No evidence of acute intracranial injury. Electronically Signed   By: Orvan Falconer M.D.   On: 08/25/2022 12:45   DG Knee 2 Views Right  Result Date: 08/25/2022 CLINICAL DATA:  Right knee pain since fall today about an hour ago EXAM: RIGHT KNEE - 1-2 VIEW COMPARISON:  None available FINDINGS: No fracture or dislocation. No acute soft tissue abnormality.  Atherosclerotic changes seen throughout visualized arterial segments. Deformity of the proximal fibular diaphysis consistent with remote healed fracture. IMPRESSION: No acute abnormality of the right knee. Electronically Signed   By: Acquanetta Belling M.D.   On: 08/25/2022 12:19   DG Chest 1 View  Result Date: 08/25/2022 CLINICAL DATA:  Evaluation of sepsis EXAM: CHEST  1 VIEW COMPARISON:  Chest radiograph dated 07/16/2020 FINDINGS: Normal lung volumes. No focal consolidations. No pleural effusion or pneumothorax. The heart size and mediastinal contours are within normal limits. Cervical spinal fixation hardware appears intact. IMPRESSION: No active disease. Electronically Signed   By: Agustin Cree M.D.   On: 08/25/2022 11:36   CT Renal Stone Study  Result Date: 08/21/2022 CLINICAL DATA:  Nausea, right-sided abdominal pain, and weakness EXAM: CT ABDOMEN AND PELVIS WITHOUT CONTRAST TECHNIQUE: Multidetector CT imaging of the abdomen and pelvis was performed following the standard protocol without IV contrast. RADIATION DOSE REDUCTION: This exam was performed according to the departmental dose-optimization program which includes automated exposure control, adjustment of the mA and/or kV according to patient size and/or use of iterative reconstruction technique. COMPARISON:  CT urogram 09/06/2019 and ultrasound 12/18/2020 FINDINGS: Lower chest: Left basilar scarring.  No acute abnormality. Hepatobiliary: Cholecystectomy. Prominent bile ducts likely due to reservoir effect. Nodular hepatic contour consistent with cirrhosis. Pancreas: Atrophic. Coarse calcifications in the pancreatic body and head. No acute abnormality. Spleen: Borderline splenomegaly measuring 13.3 cm in craniocaudal dimension. Adrenals/Urinary Tract: Stable adrenal glands. No urinary calculi or hydronephrosis. Foley catheter in the nondistended bladder. Stomach/Bowel: Normal caliber large and small bowel. Colonic diverticulosis without diverticulitis.  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 Stomach is within normal limits. The appendix is not visualized. Vascular/Lymphatic: Aortic atherosclerosis. Upper abdominal varices. No enlarged abdominal or pelvic lymph nodes. Reproductive: Status post hysterectomy. No adnexal masses.  Pessary. Other: Diffuse mesenteric edema. Small volume abdominopelvic ascites which is low-density. Musculoskeletal: Demineralization. No acute osseous abnormality. Chronic superior endplate compression of L4 IMPRESSION: 1. 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 2. 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 3. Small volume abdominopelvic ascites and diffuse mesenteric edema. 4. Sequela of chronic pancreatitis. Aortic Atherosclerosis (ICD10-I70.0). Electronically Signed   By: Minerva Fester M.D.   On: 08/21/2022 00:05     Discharge Exam: Vitals:   08/27/22 1604 08/28/22 0511  BP: (!) 116/40 (!) 117/45  Pulse:  60  Resp:  20  Temp:  (!) 97.5 F (36.4 C)  SpO2:  99%   Vitals:   08/27/22 0819 08/27/22 1325 08/27/22 1604 08/28/22 0511  BP: 106/65 (!) 108/38 (!) 116/40 (!) 117/45  Pulse: (!) 55 (!) 52  60  Resp: 19 18  20   Temp: 97.8 F (36.6 C) 97.6 F (36.4 C)  (!) 97.5 F (36.4 C)  TempSrc: Oral Oral  Oral  SpO2: 99% 98%  99%  Weight:      Height:        General: Pt is alert, awake, not in acute distress Cardiovascular: RRR, S1/S2 +, no rubs, no gallops Respiratory: CTA bilaterally, no wheezing, no rhonchi Abdominal: Soft, NT, ND, bowel sounds + Extremities: no edema, no cyanosis Foley with clear, yellow urine output noted    The results of significant diagnostics from this hospitalization (including imaging, microbiology, ancillary and laboratory) are listed below for reference.     Microbiology: Recent Results (from the past 240 hour(s))  Blood Culture  (routine x 2)     Status: None (Preliminary result)   Collection Time: 08/25/22 11:56 AM   Specimen: BLOOD RIGHT HAND  Result Value Ref Range Status   Specimen Description BLOOD RIGHT HAND  Final   Special Requests   Final    BOTTLES DRAWN AEROBIC AND ANAEROBIC Blood Culture adequate volume   Culture   Final    NO GROWTH 3 DAYS Performed at [facility], [address] [place], [place] [address]    Report Status PENDING  Incomplete  Blood Culture (routine x 2)     Status: None (Preliminary result)   Collection Time: 08/25/22 12:08 PM   Specimen: BLOOD LEFT HAND  Result Value Ref Range Status   Specimen Description BLOOD LEFT HAND  Final   Special Requests   Final    BOTTLES DRAWN AEROBIC ONLY Blood Culture adequate volume   Culture   Final    NO GROWTH 3 DAYS Performed at [facility], [address] [place], [place] [address]    Report Status PENDING  Incomplete     Labs: BNP (last 3 results) No results  for input(s): "BNP" in the last 8760 hours. Basic Metabolic Panel: Recent Labs  Lab 08/25/22 1156 08/26/22 0431 08/27/22 0442 08/28/22 0410  NA 134* 135 134* 134*  K 3.8 3.6 4.1 3.7  CL 104 106 106 107  CO2 18* 19* 21* 20*  GLUCOSE 146* 139* 165* 139*  BUN 54* 54* 52* 51*  CREATININE 2.82* 2.65* 2.70* 2.54*  CALCIUM 7.6* 7.4* 7.7* 7.8*  MG 2.3  --  2.2 2.2   Liver Function Tests: Recent Labs  Lab 08/25/22 1156  AST 30  ALT 16  ALKPHOS 102  BILITOT 0.8  PROT 5.2*  ALBUMIN 2.2*   No results for input(s): "LIPASE", "AMYLASE" in the last 168 hours. No results for input(s): "AMMONIA" in the last 168 hours. CBC: Recent Labs  Lab 08/25/22 1156 08/26/22 0431 08/27/22 0442  WBC 5.4 4.3 4.7  NEUTROABS 4.1  --   --   HGB 10.6* 9.4* 10.2*  HCT 31.7* 28.6* 31.0*  MCV 97.8 99.0 98.7  PLT 48* 41* 41*   Cardiac Enzymes: No results for input(s): "CKTOTAL", "CKMB", "CKMBINDEX", "TROPONINI" in the last 168 hours. BNP: Invalid input(s): "POCBNP" CBG: Recent  Labs  Lab 08/27/22 0726 08/27/22 1104 08/27/22 1613 08/27/22 2139 08/28/22 0737  GLUCAP 152* 190* 158* 177* 144*   D-Dimer No results for input(s): "DDIMER" in the last 72 hours. Hgb A1c Recent Labs    08/25/22 1154  HGBA1C 6.0*   Lipid Profile No results for input(s): "CHOL", "HDL", "LDLCALC", "TRIG", "CHOLHDL", "LDLDIRECT" in the last 72 hours. Thyroid function studies Recent Labs    08/25/22 1208 08/25/22 1409  TSH 0.112*  --   T3FREE  --  1.4*   Anemia work up No results for input(s): "VITAMINB12", "FOLATE", "FERRITIN", "TIBC", "IRON", "RETICCTPCT" in the last 72 hours. Urinalysis    Component Value Date/Time   COLORURINE AMBER (A) 08/25/2022 1253   APPEARANCEUR HAZY (A) 08/25/2022 1253   LABSPEC 1.009 08/25/2022 1253   PHURINE 6.0 08/25/2022 1253   GLUCOSEU NEGATIVE 08/25/2022 1253   HGBUR MODERATE (A) 08/25/2022 1253   BILIRUBINUR NEGATIVE 08/25/2022 1253   KETONESUR NEGATIVE 08/25/2022 1253   PROTEINUR 100 (A) 08/25/2022 1253   UROBILINOGEN 0.2 07/19/2014 0510   NITRITE NEGATIVE 08/25/2022 1253   LEUKOCYTESUR TRACE (A) 08/25/2022 1253   Sepsis Labs Recent Labs  Lab 08/25/22 1156 08/26/22 0431 08/27/22 0442  WBC 5.4 4.3 4.7   Microbiology Recent Results (from the past 240 hour(s))  Blood Culture (routine x 2)     Status: None (Preliminary result)   Collection Time: 08/25/22 11:56 AM   Specimen: BLOOD RIGHT HAND  Result Value Ref Range Status   Specimen Description BLOOD RIGHT HAND  Final   Special Requests   Final    BOTTLES DRAWN AEROBIC AND ANAEROBIC Blood Culture adequate volume   Culture   Final    NO GROWTH 3 DAYS Performed at [facility], [address] [place], [place] [address]    Report Status PENDING  Incomplete  Blood Culture (routine x 2)     Status: None (Preliminary result)   Collection Time: 08/25/22 12:08 PM   Specimen: BLOOD LEFT HAND  Result Value Ref Range Status   Specimen Description BLOOD LEFT HAND  Final   Special  Requests   Final    BOTTLES DRAWN AEROBIC ONLY Blood Culture adequate volume   Culture   Final    NO GROWTH 3 DAYS Performed at [facility], [address] [place], [place] [address]  Report Status PENDING  Incomplete     Time coordinating discharge: 35 minutes  SIGNED:   Erick Blinks, DO [facility] 08/28/2022, 10:18 AM  If 7PM-7AM, please contact night-coverage [email or web address]

## 2022-08-28 NOTE — Progress Notes (Signed)
Deflated, readjusted patient's foley and reinflated balloon. Have notified [facility] that patient's foley is leaking and has been leaking, medical team was notified yesterday. Patient expressed understanding. Does have follow up appt with urology on the 5th of June.

## 2022-08-29 LAB — CULTURE, BLOOD (ROUTINE X 2)

## 2022-08-30 LAB — CULTURE, BLOOD (ROUTINE X 2)
Culture: NO GROWTH
Special Requests: ADEQUATE

## 2022-09-01 DIAGNOSIS — N189 Chronic kidney disease, unspecified: Secondary | ICD-10-CM | POA: Diagnosis not present

## 2022-09-01 DIAGNOSIS — R5381 Other malaise: Secondary | ICD-10-CM | POA: Diagnosis not present

## 2022-09-02 DIAGNOSIS — E1122 Type 2 diabetes mellitus with diabetic chronic kidney disease: Secondary | ICD-10-CM | POA: Diagnosis not present

## 2022-09-02 DIAGNOSIS — K7581 Nonalcoholic steatohepatitis (NASH): Secondary | ICD-10-CM | POA: Diagnosis not present

## 2022-09-02 DIAGNOSIS — K7460 Unspecified cirrhosis of liver: Secondary | ICD-10-CM | POA: Diagnosis not present

## 2022-09-02 DIAGNOSIS — M6281 Muscle weakness (generalized): Secondary | ICD-10-CM | POA: Diagnosis not present

## 2022-09-02 DIAGNOSIS — R2689 Other abnormalities of gait and mobility: Secondary | ICD-10-CM | POA: Diagnosis not present

## 2022-09-02 DIAGNOSIS — N139 Obstructive and reflux uropathy, unspecified: Secondary | ICD-10-CM | POA: Diagnosis not present

## 2022-09-02 DIAGNOSIS — N189 Chronic kidney disease, unspecified: Secondary | ICD-10-CM | POA: Diagnosis not present

## 2022-09-02 DIAGNOSIS — E039 Hypothyroidism, unspecified: Secondary | ICD-10-CM | POA: Diagnosis not present

## 2022-09-02 DIAGNOSIS — I10 Essential (primary) hypertension: Secondary | ICD-10-CM | POA: Diagnosis not present

## 2022-09-03 ENCOUNTER — Encounter (HOSPITAL_COMMUNITY): Payer: Self-pay

## 2022-09-03 ENCOUNTER — Telehealth: Payer: Self-pay

## 2022-09-03 ENCOUNTER — Inpatient Hospital Stay (HOSPITAL_COMMUNITY)
Admission: EM | Admit: 2022-09-03 | Discharge: 2022-09-10 | DRG: 698 | Disposition: A | Discharge status: Skilled Nursing Facility | Payer: Medicare Other | Source: Ambulatory Visit | Attending: Family Medicine | Admitting: Family Medicine

## 2022-09-03 ENCOUNTER — Emergency Department (HOSPITAL_COMMUNITY): Payer: Medicare Other

## 2022-09-03 ENCOUNTER — Other Ambulatory Visit: Payer: Self-pay

## 2022-09-03 DIAGNOSIS — Y732 Prosthetic and other implants, materials and accessory gastroenterology and urology devices associated with adverse incidents: Secondary | ICD-10-CM | POA: Diagnosis present

## 2022-09-03 DIAGNOSIS — T83011A Breakdown (mechanical) of indwelling urethral catheter, initial encounter: Secondary | ICD-10-CM

## 2022-09-03 DIAGNOSIS — E8809 Other disorders of plasma-protein metabolism, not elsewhere classified: Secondary | ICD-10-CM | POA: Diagnosis not present

## 2022-09-03 DIAGNOSIS — T83018A Breakdown (mechanical) of other indwelling urethral catheter, initial encounter: Principal | ICD-10-CM | POA: Diagnosis present

## 2022-09-03 DIAGNOSIS — D631 Anemia in chronic kidney disease: Secondary | ICD-10-CM | POA: Diagnosis present

## 2022-09-03 DIAGNOSIS — E872 Acidosis, unspecified: Secondary | ICD-10-CM | POA: Diagnosis not present

## 2022-09-03 DIAGNOSIS — I129 Hypertensive chronic kidney disease with stage 1 through stage 4 chronic kidney disease, or unspecified chronic kidney disease: Secondary | ICD-10-CM | POA: Diagnosis present

## 2022-09-03 DIAGNOSIS — E039 Hypothyroidism, unspecified: Secondary | ICD-10-CM | POA: Diagnosis present

## 2022-09-03 DIAGNOSIS — E7800 Pure hypercholesterolemia, unspecified: Secondary | ICD-10-CM | POA: Diagnosis not present

## 2022-09-03 DIAGNOSIS — E11319 Type 2 diabetes mellitus with unspecified diabetic retinopathy without macular edema: Secondary | ICD-10-CM | POA: Diagnosis not present

## 2022-09-03 DIAGNOSIS — N184 Chronic kidney disease, stage 4 (severe): Secondary | ICD-10-CM | POA: Diagnosis present

## 2022-09-03 DIAGNOSIS — Z885 Allergy status to narcotic agent status: Secondary | ICD-10-CM

## 2022-09-03 DIAGNOSIS — Z825 Family history of asthma and other chronic lower respiratory diseases: Secondary | ICD-10-CM

## 2022-09-03 DIAGNOSIS — E869 Volume depletion, unspecified: Secondary | ICD-10-CM | POA: Diagnosis not present

## 2022-09-03 DIAGNOSIS — D61818 Other pancytopenia: Secondary | ICD-10-CM | POA: Diagnosis present

## 2022-09-03 DIAGNOSIS — I10 Essential (primary) hypertension: Secondary | ICD-10-CM | POA: Diagnosis not present

## 2022-09-03 DIAGNOSIS — R2681 Unsteadiness on feet: Secondary | ICD-10-CM | POA: Diagnosis not present

## 2022-09-03 DIAGNOSIS — Z7189 Other specified counseling: Secondary | ICD-10-CM | POA: Diagnosis not present

## 2022-09-03 DIAGNOSIS — R633 Feeding difficulties, unspecified: Secondary | ICD-10-CM | POA: Diagnosis present

## 2022-09-03 DIAGNOSIS — E1122 Type 2 diabetes mellitus with diabetic chronic kidney disease: Secondary | ICD-10-CM | POA: Diagnosis not present

## 2022-09-03 DIAGNOSIS — Q211 Atrial septal defect, unspecified: Secondary | ICD-10-CM | POA: Diagnosis not present

## 2022-09-03 DIAGNOSIS — Z981 Arthrodesis status: Secondary | ICD-10-CM

## 2022-09-03 DIAGNOSIS — H35033 Hypertensive retinopathy, bilateral: Secondary | ICD-10-CM | POA: Diagnosis not present

## 2022-09-03 DIAGNOSIS — I5031 Acute diastolic (congestive) heart failure: Secondary | ICD-10-CM | POA: Diagnosis not present

## 2022-09-03 DIAGNOSIS — M109 Gout, unspecified: Secondary | ICD-10-CM | POA: Diagnosis present

## 2022-09-03 DIAGNOSIS — Z794 Long term (current) use of insulin: Secondary | ICD-10-CM

## 2022-09-03 DIAGNOSIS — E1142 Type 2 diabetes mellitus with diabetic polyneuropathy: Secondary | ICD-10-CM | POA: Diagnosis present

## 2022-09-03 DIAGNOSIS — R188 Other ascites: Secondary | ICD-10-CM | POA: Diagnosis not present

## 2022-09-03 DIAGNOSIS — M6281 Muscle weakness (generalized): Secondary | ICD-10-CM | POA: Diagnosis not present

## 2022-09-03 DIAGNOSIS — N179 Acute kidney failure, unspecified: Secondary | ICD-10-CM | POA: Diagnosis not present

## 2022-09-03 DIAGNOSIS — K767 Hepatorenal syndrome: Secondary | ICD-10-CM | POA: Diagnosis not present

## 2022-09-03 DIAGNOSIS — E46 Unspecified protein-calorie malnutrition: Secondary | ICD-10-CM | POA: Diagnosis not present

## 2022-09-03 DIAGNOSIS — R2689 Other abnormalities of gait and mobility: Secondary | ICD-10-CM | POA: Diagnosis not present

## 2022-09-03 DIAGNOSIS — N189 Chronic kidney disease, unspecified: Secondary | ICD-10-CM | POA: Diagnosis present

## 2022-09-03 DIAGNOSIS — E785 Hyperlipidemia, unspecified: Secondary | ICD-10-CM | POA: Diagnosis not present

## 2022-09-03 DIAGNOSIS — M1A9XX1 Chronic gout, unspecified, with tophus (tophi): Secondary | ICD-10-CM | POA: Diagnosis not present

## 2022-09-03 DIAGNOSIS — Z66 Do not resuscitate: Secondary | ICD-10-CM | POA: Diagnosis not present

## 2022-09-03 DIAGNOSIS — E440 Moderate protein-calorie malnutrition: Secondary | ICD-10-CM | POA: Diagnosis present

## 2022-09-03 DIAGNOSIS — Z881 Allergy status to other antibiotic agents status: Secondary | ICD-10-CM

## 2022-09-03 DIAGNOSIS — R7989 Other specified abnormal findings of blood chemistry: Secondary | ICD-10-CM | POA: Insufficient documentation

## 2022-09-03 DIAGNOSIS — I700 Atherosclerosis of aorta: Secondary | ICD-10-CM | POA: Diagnosis not present

## 2022-09-03 DIAGNOSIS — E113599 Type 2 diabetes mellitus with proliferative diabetic retinopathy without macular edema, unspecified eye: Secondary | ICD-10-CM | POA: Diagnosis not present

## 2022-09-03 DIAGNOSIS — Z9049 Acquired absence of other specified parts of digestive tract: Secondary | ICD-10-CM

## 2022-09-03 DIAGNOSIS — Z79899 Other long term (current) drug therapy: Secondary | ICD-10-CM

## 2022-09-03 DIAGNOSIS — D696 Thrombocytopenia, unspecified: Secondary | ICD-10-CM | POA: Diagnosis present

## 2022-09-03 DIAGNOSIS — R34 Anuria and oliguria: Secondary | ICD-10-CM | POA: Diagnosis not present

## 2022-09-03 DIAGNOSIS — N1832 Chronic kidney disease, stage 3b: Secondary | ICD-10-CM | POA: Diagnosis not present

## 2022-09-03 DIAGNOSIS — M8580 Other specified disorders of bone density and structure, unspecified site: Secondary | ICD-10-CM | POA: Diagnosis present

## 2022-09-03 DIAGNOSIS — R1311 Dysphagia, oral phase: Secondary | ICD-10-CM | POA: Diagnosis not present

## 2022-09-03 DIAGNOSIS — R339 Retention of urine, unspecified: Secondary | ICD-10-CM | POA: Diagnosis not present

## 2022-09-03 DIAGNOSIS — S82831D Other fracture of upper and lower end of right fibula, subsequent encounter for closed fracture with routine healing: Secondary | ICD-10-CM | POA: Diagnosis not present

## 2022-09-03 DIAGNOSIS — K7460 Unspecified cirrhosis of liver: Secondary | ICD-10-CM | POA: Diagnosis not present

## 2022-09-03 DIAGNOSIS — N139 Obstructive and reflux uropathy, unspecified: Secondary | ICD-10-CM | POA: Diagnosis not present

## 2022-09-03 DIAGNOSIS — R001 Bradycardia, unspecified: Secondary | ICD-10-CM | POA: Diagnosis not present

## 2022-09-03 DIAGNOSIS — Z90721 Acquired absence of ovaries, unilateral: Secondary | ICD-10-CM

## 2022-09-03 DIAGNOSIS — R9431 Abnormal electrocardiogram [ECG] [EKG]: Secondary | ICD-10-CM | POA: Diagnosis not present

## 2022-09-03 DIAGNOSIS — Z801 Family history of malignant neoplasm of trachea, bronchus and lung: Secondary | ICD-10-CM

## 2022-09-03 DIAGNOSIS — Z882 Allergy status to sulfonamides status: Secondary | ICD-10-CM

## 2022-09-03 DIAGNOSIS — E871 Hypo-osmolality and hyponatremia: Secondary | ICD-10-CM | POA: Diagnosis not present

## 2022-09-03 DIAGNOSIS — E441 Mild protein-calorie malnutrition: Secondary | ICD-10-CM | POA: Diagnosis not present

## 2022-09-03 DIAGNOSIS — R1319 Other dysphagia: Secondary | ICD-10-CM | POA: Diagnosis not present

## 2022-09-03 DIAGNOSIS — Z515 Encounter for palliative care: Secondary | ICD-10-CM | POA: Diagnosis not present

## 2022-09-03 DIAGNOSIS — W19XXXD Unspecified fall, subsequent encounter: Secondary | ICD-10-CM | POA: Diagnosis not present

## 2022-09-03 DIAGNOSIS — R600 Localized edema: Secondary | ICD-10-CM

## 2022-09-03 DIAGNOSIS — Z833 Family history of diabetes mellitus: Secondary | ICD-10-CM

## 2022-09-03 DIAGNOSIS — Z6832 Body mass index (BMI) 32.0-32.9, adult: Secondary | ICD-10-CM | POA: Diagnosis not present

## 2022-09-03 DIAGNOSIS — D649 Anemia, unspecified: Secondary | ICD-10-CM | POA: Diagnosis not present

## 2022-09-03 DIAGNOSIS — Q794 Prune belly syndrome: Secondary | ICD-10-CM | POA: Diagnosis not present

## 2022-09-03 DIAGNOSIS — Z79890 Hormone replacement therapy (postmenopausal): Secondary | ICD-10-CM

## 2022-09-03 DIAGNOSIS — Z888 Allergy status to other drugs, medicaments and biological substances status: Secondary | ICD-10-CM

## 2022-09-03 DIAGNOSIS — R279 Unspecified lack of coordination: Secondary | ICD-10-CM | POA: Diagnosis not present

## 2022-09-03 DIAGNOSIS — N993 Prolapse of vaginal vault after hysterectomy: Secondary | ICD-10-CM | POA: Diagnosis present

## 2022-09-03 DIAGNOSIS — K7581 Nonalcoholic steatohepatitis (NASH): Secondary | ICD-10-CM | POA: Diagnosis not present

## 2022-09-03 DIAGNOSIS — N8110 Cystocele, unspecified: Secondary | ICD-10-CM | POA: Diagnosis not present

## 2022-09-03 DIAGNOSIS — K219 Gastro-esophageal reflux disease without esophagitis: Secondary | ICD-10-CM | POA: Diagnosis present

## 2022-09-03 DIAGNOSIS — E1165 Type 2 diabetes mellitus with hyperglycemia: Secondary | ICD-10-CM | POA: Diagnosis not present

## 2022-09-03 LAB — COMPREHENSIVE METABOLIC PANEL
ALT: 24 U/L (ref 0–44)
AST: 34 U/L (ref 15–41)
Albumin: 2.3 g/dL — ABNORMAL LOW (ref 3.5–5.0)
Alkaline Phosphatase: 89 U/L (ref 38–126)
Anion gap: 8 (ref 5–15)
BUN: 61 mg/dL — ABNORMAL HIGH (ref 8–23)
CO2: 19 mmol/L — ABNORMAL LOW (ref 22–32)
Calcium: 7.8 mg/dL — ABNORMAL LOW (ref 8.9–10.3)
Chloride: 104 mmol/L (ref 98–111)
Creatinine, Ser: 3.87 mg/dL — ABNORMAL HIGH (ref 0.44–1.00)
GFR, Estimated: 12 mL/min — ABNORMAL LOW (ref >60)
Glucose, Bld: 143 mg/dL — ABNORMAL HIGH (ref 70–99)
Potassium: 3.8 mmol/L (ref 3.5–5.1)
Sodium: 131 mmol/L — ABNORMAL LOW (ref 135–145)
Total Bilirubin: 1.3 mg/dL — ABNORMAL HIGH (ref 0.3–1.2)
Total Protein: 5.1 g/dL — ABNORMAL LOW (ref 6.5–8.1)

## 2022-09-03 LAB — MAGNESIUM: Magnesium: 2.4 mg/dL (ref 1.7–2.4)

## 2022-09-03 LAB — CBC WITH DIFFERENTIAL/PLATELET
Basophils Absolute: 0 10*3/uL (ref 0.0–0.1)
Hemoglobin: 10.2 g/dL — ABNORMAL LOW (ref 12.0–15.0)
Lymphocytes Relative: 17 %
Platelets: 54 10*3/uL — ABNORMAL LOW (ref 150–400)
RDW: 16 % — ABNORMAL HIGH (ref 11.5–15.5)
nRBC: 0 % (ref 0.0–0.2)

## 2022-09-03 LAB — GLUCOSE, CAPILLARY: Glucose-Capillary: 113 mg/dL — ABNORMAL HIGH (ref 70–99)

## 2022-09-03 LAB — BRAIN NATRIURETIC PEPTIDE: B Natriuretic Peptide: 274 pg/mL — ABNORMAL HIGH (ref 0.0–100.0)

## 2022-09-03 MED ORDER — PROCHLORPERAZINE EDISYLATE 10 MG/2ML IJ SOLN
10.0000 mg | Freq: Four times a day (QID) | INTRAMUSCULAR | Status: DC | PRN
  Administered 2022-09-05 – 2022-09-09 (×3): 10 mg via INTRAVENOUS
  Filled 2022-09-03 (×3): qty 2

## 2022-09-03 MED ORDER — INSULIN ASPART 100 UNIT/ML IJ SOLN
0.0000 [IU] | Freq: Every day | INTRAMUSCULAR | Status: DC
  Administered 2022-09-07: 2 [IU] via SUBCUTANEOUS

## 2022-09-03 MED ORDER — ACETAMINOPHEN 325 MG PO TABS
650.0000 mg | ORAL_TABLET | Freq: Four times a day (QID) | ORAL | Status: DC | PRN
  Filled 2022-09-03: qty 2

## 2022-09-03 MED ORDER — ACETAMINOPHEN 650 MG RE SUPP: 650.0000 mg | Freq: Four times a day (QID) | RECTAL | Status: DC | PRN

## 2022-09-03 MED ORDER — INSULIN ASPART 100 UNIT/ML IJ SOLN
0.0000 [IU] | Freq: Three times a day (TID) | INTRAMUSCULAR | Status: DC
  Administered 2022-09-04 – 2022-09-05 (×2): 2 [IU] via SUBCUTANEOUS
  Administered 2022-09-05: 1 [IU] via SUBCUTANEOUS
  Administered 2022-09-06: 2 [IU] via SUBCUTANEOUS
  Administered 2022-09-06: 1 [IU] via SUBCUTANEOUS
  Administered 2022-09-07: 2 [IU] via SUBCUTANEOUS
  Administered 2022-09-07 – 2022-09-08 (×3): 1 [IU] via SUBCUTANEOUS
  Administered 2022-09-08 – 2022-09-09 (×3): 2 [IU] via SUBCUTANEOUS
  Administered 2022-09-09 (×2): 1 [IU] via SUBCUTANEOUS
  Administered 2022-09-10: 2 [IU] via SUBCUTANEOUS
  Administered 2022-09-10: 1 [IU] via SUBCUTANEOUS
  Administered 2022-09-10: 2 [IU] via SUBCUTANEOUS

## 2022-09-03 MED ORDER — FUROSEMIDE 10 MG/ML IJ SOLN
40.0000 mg | Freq: Once | INTRAMUSCULAR | Status: AC
  Administered 2022-09-03: 40 mg via INTRAVENOUS
  Filled 2022-09-03: qty 4

## 2022-09-03 MED ORDER — GLUCERNA SHAKE PO LIQD
237.0000 mL | Freq: Three times a day (TID) | ORAL | Status: DC
  Administered 2022-09-04 – 2022-09-08 (×10): 237 mL via ORAL

## 2022-09-03 MED ORDER — LEVOTHYROXINE SODIUM 50 MCG PO TABS
125.0000 ug | ORAL_TABLET | Freq: Every day | ORAL | Status: DC
  Administered 2022-09-04 – 2022-09-10 (×7): 125 ug via ORAL
  Filled 2022-09-03 (×7): qty 1

## 2022-09-03 NOTE — ED Provider Notes (Signed)
[name] AT [facility] Provider Note   CSN: [id number] Arrival date & time: 09/03/22  1538     History  Chief Complaint  Patient presents with   foley catheter not draining    [name] is a 72 y.o. female with [name] cirrhosis with ascites, T2DM, HLD, hypothyroidism, CKD stage III, thrombocytopenia presents with foley catheter not draining and lower extremity edema. Patient presents to ED from [facility] in [place] accompanied by her son who provides additional history.  Son states that last week patient went to the urologist for management of her chronic bladder prolapse and had a pessary placed.  He states that the urologist noted a small laceration on the prolapsed portion of her bladder which was repaired in the office and he placed a Foley catheter to help her bladder rest.  This was supposed to be removed last week however patient experienced a fall and ended up in the emergency department where the Foley catheter was replaced.  Patient has noted that over the last week or so she has not put much urine into the Foley bag but rather is of gushing urine out of her urethra around the catheter especially when she stands up.  She states she feels like she has to pee all the time and has not had any urine in the Foley bag.  Per chart review patient was recently admitted from 08/25/2022 to 08/28/2022 for generalized weakness nausea and poor oral intake noted to have an AKI in the setting of recent Bactrim use for treatment of UTI.  She also had falls at home and was placed at the [facility]. Cr max was 2.82 at that time, decreased to 2.54 at discharge. Pt denies any significant abd pain, flank pain, fevers/chills, hematuria. She does state she has had much increased lower extremity edema, and abdominal fullness she associates with increased fluid and her ascites/cirrhosis. Was seen by her diabetes doctor today who sent her to ED for diuresis and foley catheter  replacement.  She had no significant bleeding from the vagina or vaginal pain from the pessary replacement.   HPI     Home Medications Prior to Admission medications   Medication Sig Start Date End Date Taking? Authorizing Provider  levothyroxine (SYNTHROID) 125 MCG tablet 1 tablet in the morning on an empty stomach Orally Once a day for 30 day(s) 09/03/22  Yes [provider]  allopurinol (ZYLOPRIM) 100 MG tablet Take 100 mg by mouth every evening.     [provider]  Cholecalciferol (VITAMIN D) 125 MCG (5000 UT) CAPS Take 5,000 Units by mouth every Wednesday.    [provider]  Cholecalciferol (VITAMIN D-3) 125 MCG (5000 UT) TABS Take 1 tablet by mouth every Wednesday.    [provider]  cholecalciferol (VITAMIN D3) 25 MCG (1000 UNIT) tablet 5 tablets Orally wednesday only    [provider]  insulin NPH Human (NOVOLIN N) 100 UNIT/ML injection 14 units PM if blood sugar is >200 once a day for 28 days    [provider]  insulin regular (NOVOLIN R,HUMULIN R) 100 units/mL injection Inject 5-8 Units into the skin 2 (two) times daily before a meal.    [provider]  nadolol (CORGARD) 40 MG tablet Take 40 mg by mouth daily. 08/19/20   [provider]  neomycin-polymyxin b-dexamethasone (MAXITROL) 3.5-10000-0.1 OINT Place into the right eye 2 (two) times daily. Patient not taking: Reported on 08/25/2022 06/11/22   [provider]  NOVOLIN N RELION 100 UNIT/ML injection Inject 14 Units into the skin 2 (two) times daily. 09/14/19   [provider]  [facility] ULTRA test strip  12/19/18   [provider]  sodium bicarbonate 650 MG tablet Take 1 tablet (650 mg total) by mouth 2 (two) times daily. 08/28/22 09/27/22  Sherryll Burger, Pratik D, DO  Vitamin D, Ergocalciferol, (DRISDOL) 1.25 MG (50000 UT) CAPS capsule Take 50,000 Units by mouth every Saturday. 11/16/18   [provider]      Allergies    Bactrim  [sulfamethoxazole-trimethoprim], Ciprofloxacin, Crestor [rosuvastatin], Lipitor [atorvastatin], Lisinopril, Losartan potassium, Lovastatin, Statins, Welchol [colesevelam], Zetia [ezetimibe], and Dilaudid [hydromorphone hcl]    Review of Systems   Review of Systems Review of systems Negative for f/c.  A 10 point review of systems was performed and is negative unless otherwise reported in HPI.  Physical Exam Updated Vital Signs BP (!) 105/43   Pulse (!) 57   Temp 98.3 F (36.8 C) (Oral)   Resp 16   Ht 5\' 6"  (1.676 m)   Wt 91.4 kg   SpO2 98%   BMI 32.52 kg/m  Physical Exam General: Normal appearing female, lying in bed.  HEENT: Sclera anicteric, MMM, trachea midline.  Cardiology: RRR, no murmurs/rubs/gallops. BL radial and DP pulses equal bilaterally.  Resp: Normal respiratory rate and effort. CTAB, no wheezes, rhonchi, crackles.  Abd: Lower abdominal fullness. Soft, non-distended. No rebound tenderness or guarding.  GU: Deferred. MSK: 3+ pitting edema BL LEs. No signs of trauma. Extremities without deformity or TTP. No cyanosis or clubbing. Skin: warm, dry. Back: No CVA tenderness Neuro: A&Ox4, CNs II-XII grossly intact. MAEs. Sensation grossly intact.  Psych: Normal mood and affect.   ED Results / Procedures / Treatments   Labs (all labs ordered are listed, but only abnormal results are displayed) Labs Reviewed  CBC WITH DIFFERENTIAL/PLATELET - Abnormal; Notable for the following components:      Result Value   RBC 3.12 (*)    Hemoglobin 10.2 (*)    HCT 30.6 (*)    RDW 16.0 (*)    Platelets 54 (*)    All other components within normal limits  COMPREHENSIVE METABOLIC PANEL - Abnormal; Notable for the following components:   Sodium 131 (*)    CO2 19 (*)    Glucose, Bld 143 (*)    BUN 61 (*)    Creatinine, Ser 3.87 (*)    Calcium 7.8 (*)    Total Protein 5.1 (*)    Albumin 2.3 (*)    Total Bilirubin 1.3 (*)    GFR, Estimated 12 (*)    All other components within  normal limits  BRAIN NATRIURETIC PEPTIDE - Abnormal; Notable for the following components:   B Natriuretic Peptide 274.0 (*)    All other components within normal limits  MAGNESIUM  URINALYSIS, W/ REFLEX TO CULTURE (INFECTION SUSPECTED)    EKG None  Radiology No results found.  Procedures Procedures    Medications Ordered in ED Medications  furosemide (LASIX) injection 40 mg (has no administration in time range)    ED Course/ Medical Decision Making/ A&P                          Medical Decision Making Amount and/or Complexity of Data Reviewed Labs: ordered. Decision-making details documented in ED Course. Radiology: ordered.  Risk Prescription drug management. Decision regarding hospitalization.    This patient presents to the ED  for concern of malfunctioning foley catheter, this involves an extensive number of treatment options, and is a complaint that carries with it a high risk of complications and morbidity.  I considered the following differential and admission for this acute, potentially life threatening condition. She is overall HDS, afebrile, non-toxic appearing.   MDM:    Consider obstructed, malpositioned, nonfunctional urinary catheter.  Consider ongoing UTI as patient was recently admitted for this.  Also given that patient reportedly has had only leaking of urine around the Foley consider obstructive uropathy and retained urine, possible hydronephrosis, consider acute AKI in the setting of her CKD and we will obtain labs.  She has no abdominal or flank pain. Chart review reveals an appointment on 08/18/2022 with OB for pessary check and was noted to have a pressure ulcer which was closed in the office and Foley was placed, patient was placed on Cipro twice a day for infection prophylaxis and plan was for follow-up in 1 week, however patient was admitted inpatient during that time and was supposed to follow-up with urology on 09/02/2022 however missed that  appointment.  Discussed with patient her options and will remove the Foley today and will trial void with PVR to see how she is doing and possibly replace the Foley if she cannot void all the way.  Patient with increased lower extremity edema and states her abdomen is full.  This could be from an enlarged bladder or could be from worsening ascites.  She has no abdominal tenderness palpation at all and is afebrile, no concern for SBP, appendicitis, cholecystitis/cholelithiasis, diverticulitis, SBO.  Will evaluate patient's kidney function electrolytes and consider diuresis.  Clinical Course as of 09/03/22 1953  Thu Sep 03, 2022  1723 B Natriuretic Peptide(!): 274.0 [HN]  1723 Creatinine(!): 3.87 Increase dfrom BL 2.4 [HN]  1723 Sodium(!): 131 [HN]  1723 CO2(!): 19 [HN]  1723 Hemoglobin(!): 10.2 At BL [HN]  1723 WBC: 7.1 No leukocytosis [HN]  1852 PVR 250 cc. Patient was incontinent in bed and states she still does have the urge to pee. Her Cr is continuing to increase, CO2 downtrending like a possible RTA, she has not been able to give a clean urine sample and I've instructed the BSRN to place purewick and ordered I's/O's. She will ultimately need diuresis and with increasing Cr and already mild hyponatremia, believe she would best be served by an admission. Will consult to hospitalist for admission. [HN]    Clinical Course User Index [HN] Loetta Rough, MD    Labs: I Ordered, and personally interpreted labs.  The pertinent results include:  those listed above  Additional history obtained from chart review, son at bedside.    Cardiac Monitoring: The patient was maintained on a cardiac monitor.  I personally viewed and interpreted the cardiac monitored which showed an underlying rhythm of: sinus bradycardia, NSR  Reevaluation: After the interventions noted above, I reevaluated the patient and found that they have improved  Social Determinants of Health: Lives at [facility]  Disposition:  Given 40 mg IV lasix and admitted to hospitalist Dr. Thomes Dinning, who requests CXR as well to evaluate respiratory fluid status.   Co morbidities that complicate the patient evaluation  Past Medical History:  Diagnosis Date   Alkaline phosphatase elevation    Anemia    ASD (atrial septal defect)    Cataract    both eyes hx of   Chest pain    03-17-2013 last chest pain   Chronic kidney disease  Stage III kidney disease   Cirrhosis (HCC)    Depression    Diabetes mellitus type II    Family history of adverse reaction to anesthesia    Son hard to wake up   Fatigue    GERD (gastroesophageal reflux disease)    Gout    Headache(784.0)    occasional   Hyperlipidemia    Hypertension    Hypothyroidism    Neuropathy    Osteopenia    Peripheral neuropathy    Presence of pessary    Retinopathy      Medicines Meds ordered this encounter  Medications   furosemide (LASIX) injection 40 mg    I have reviewed the patients home medicines and have made adjustments as needed  Problem List / ED Course: Problem List Items Addressed This Visit       Genitourinary   AKI (acute kidney injury) (HCC) - Primary   Other Visit Diagnoses     Malfunction of Foley catheter, initial encounter (HCC)       Peripheral edema                       This note was created using dictation software, which may contain spelling or grammatical errors.    Loetta Rough, MD 09/03/22 [id number]

## 2022-09-03 NOTE — ED Notes (Signed)
Patient transported to X-ray 

## 2022-09-03 NOTE — Telephone Encounter (Signed)
Patients daughter called advising that patients endocrinologist stated she needed a sooner appointment, catheter placed 5/21. She was admitted on 08/28/22 for acute bladder injury.  Her daughter advised that she is concerned that her current catheter is not draining properly.   Currently scheduled for new patient appt 6/18 with you.

## 2022-09-03 NOTE — H&P (Signed)
History and Physical    Patient: [name] UJW:[id number] DOB: Mar 04, 1951 DOA: 09/03/2022 DOS: the patient was seen and examined on 09/03/2022 PCP: Benita Stabile, MD  Patient coming from: Home  Chief Complaint:  Chief Complaint  Patient presents with   foley catheter not draining   HPI: [name] is a 72 y.o. female with medical history significant of NASH liver cirrhosis, T2DM, essential hypertension, hypothyroidism, chronic kidney disease stage III who presents to the emergency department from [facility] in [place] due to lower extremity edema and non-draining Foley.  She was accompanied by son at bedside. Patient was recently seen by her urologist due to chronic vaginal/bladder prolapse and a pessary was placed, a small laceration was noted on the prolapsed bladder, this was repaired and a Foley catheter was placed to allow the bladder to rest while healing.  The Foley drained partially on that day, but patient returned to the urologist and the Foley was readjusted, unfortunately, it was still not draining well. She was seen in the emergency department on 5/23 due to UTI whereby, IV Rocephin was given in the ED.  Foley catheter was changed due to urine draining around the catheter.  Apparently, patient has been prescribed with Bactrim by her gynecologist, she was advised to continue with the medication, she was discharged home from the ED. She complained of increased leg swelling and increased abdominal girth which she thought was due to increased fluid due to ascites/cirrhosis.  Patient also complained of shortness of breath within the last 2 days She went to see her endocrinologist today and she was sent to the ED for diuresis and Foley catheter replacement. Patient was recently admitted from 5/28 to 5/31 due to acute kidney injury superimposed on chronic kidney disease  ED Course:  In the emergency department, HR was 55, BP 111/56 and other vital signs are within normal range.  Workup in  the ED showed WBC 7.1, hemoglobin 10.2, hematocrit 30.6, MCV 98.1, platelets 54 BMP showed sodium 131, potassium 3.8, chloride 104, bicarb 19, glucose 143, BUN 61, creatinine 3.87, albumin 2.3, BNP 274, magnesium 2.4 She was treated with IV Lasix 40 mg x 1.  Hospitalist was asked to admit patient for further evaluation and management.  Review of Systems: Review of systems as noted in the HPI. All other systems reviewed and are negative.   Past Medical History:  Diagnosis Date   Alkaline phosphatase elevation    Anemia    ASD (atrial septal defect)    Cataract    both eyes hx of   Chest pain    03-17-2013 last chest pain   Chronic kidney disease    Stage III kidney disease   Cirrhosis (HCC)    Depression    Diabetes mellitus type II    Family history of adverse reaction to anesthesia    Son hard to wake up   Fatigue    GERD (gastroesophageal reflux disease)    Gout    Headache(784.0)    occasional   Hyperlipidemia    Hypertension    Hypothyroidism    Neuropathy    Osteopenia    Peripheral neuropathy    Presence of pessary    Retinopathy    Past Surgical History:  Procedure Laterality Date   APPENDECTOMY     CATARACT EXTRACTION     CERVICAL SPINE SURGERY  2006   CHOLECYSTECTOMY N/A 04/07/2013   Procedure: LAPAROSCOPIC CHOLECYSTECTOMY WITH INTRAOPERATIVE CHOLANGIOGRAM;  Surgeon: Robyne Askew, MD;  Location: [facility];  Service: General;  Laterality: N/A;   COMBINED HYSTERECTOMY VAGINAL / OOPHORECTOMY / A&P REPAIR  1987   Unilateral oophorectomy, h/o uterine prolapse has right ovary   ERCP N/A 04/06/2013   Procedure: ENDOSCOPIC RETROGRADE CHOLANGIOPANCREATOGRAPHY (ERCP);  Surgeon: Theda Belfast, MD;  Location: [facility];  Service: Endoscopy;  Laterality: N/A;   ESOPHAGEAL BANDING N/A 09/26/2021   Procedure: ESOPHAGEAL BANDING;  Surgeon: Jeani Hawking, MD;  Location: [facility];  Service: Gastroenterology;  Laterality: N/A;   ESOPHAGEAL BANDING N/A 10/31/2021    Procedure: ESOPHAGEAL BANDING;  Surgeon: Jeani Hawking, MD;  Location: [facility];  Service: Gastroenterology;  Laterality: N/A;   ESOPHAGOGASTRODUODENOSCOPY (EGD) WITH PROPOFOL N/A 08/16/2020   Procedure: ESOPHAGOGASTRODUODENOSCOPY (EGD) WITH PROPOFOL;  Surgeon: Jeani Hawking, MD;  Location: [facility];  Service: Endoscopy;  Laterality: N/A;   ESOPHAGOGASTRODUODENOSCOPY (EGD) WITH PROPOFOL N/A 09/26/2021   Procedure: ESOPHAGOGASTRODUODENOSCOPY (EGD) WITH PROPOFOL;  Surgeon: Jeani Hawking, MD;  Location: [facility];  Service: Gastroenterology;  Laterality: N/A;   ESOPHAGOGASTRODUODENOSCOPY (EGD) WITH PROPOFOL N/A 10/31/2021   Procedure: ESOPHAGOGASTRODUODENOSCOPY (EGD) WITH PROPOFOL;  Surgeon: Jeani Hawking, MD;  Location: [facility];  Service: Gastroenterology;  Laterality: N/A;   EUS N/A 03/31/2013   Procedure: UPPER ENDOSCOPIC ULTRASOUND (EUS) LINEAR;  Surgeon: Theda Belfast, MD;  Location: [facility];  Service: Endoscopy;  Laterality: N/A;   REFRACTIVE SURGERY     SPINAL FUSION     c4-c7   TONSILLECTOMY  age 57    Social History:  reports that she has never smoked. She has never used smokeless tobacco. She reports that she does not drink alcohol and does not use drugs.   Allergies  Allergen Reactions   Bactrim [Sulfamethoxazole-Trimethoprim] Nausea And Vomiting and Nausea Only   Ciprofloxacin      severe yeast infection   Crestor [Rosuvastatin]     arthralgia/myalgia   Lipitor [Atorvastatin]     arthralgia/myalgia   Lisinopril Cough   Losartan Potassium Cough   Lovastatin     arthralgia/myalgia   Statins Other (See Comments)    arthralgia/myalgia   Welchol [Colesevelam]     arthralgia/myalgia   Zetia [Ezetimibe]     arthralgia/myalgia   Dilaudid [Hydromorphone Hcl] Nausea And Vomiting    After one vomiting episode no further vomiting    Family History  Problem Relation Age of Onset   COPD Mother    Lung cancer Father    Diabetes Sister    Cataracts Sister     Insulin resistance Daughter    Insulin resistance Son      Prior to Admission medications   Medication Sig Start Date End Date Taking? Authorizing Provider  levothyroxine (SYNTHROID) 125 MCG tablet 1 tablet in the morning on an empty stomach Orally Once a day for 30 day(s) 09/03/22  Yes [provider]  allopurinol (ZYLOPRIM) 100 MG tablet Take 100 mg by mouth every evening.     [provider]  Cholecalciferol (VITAMIN D) 125 MCG (5000 UT) CAPS Take 5,000 Units by mouth every Wednesday.    [provider]  Cholecalciferol (VITAMIN D-3) 125 MCG (5000 UT) TABS Take 1 tablet by mouth every Wednesday.    [provider]  cholecalciferol (VITAMIN D3) 25 MCG (1000 UNIT) tablet 5 tablets Orally wednesday only    [provider]  insulin NPH Human (NOVOLIN N) 100 UNIT/ML injection 14 units PM if blood sugar is >200 once a day for 28 days    [provider]  insulin regular (NOVOLIN  R,HUMULIN R) 100 units/mL injection Inject 5-8 Units into the skin 2 (two) times daily before a meal.    [provider]  nadolol (CORGARD) 40 MG tablet Take 40 mg by mouth daily. 08/19/20   [provider]  neomycin-polymyxin b-dexamethasone (MAXITROL) 3.5-10000-0.1 OINT Place into the right eye 2 (two) times daily. Patient not taking: Reported on 08/25/2022 06/11/22   [provider]  NOVOLIN N RELION 100 UNIT/ML injection Inject 14 Units into the skin 2 (two) times daily. 09/14/19   [provider]  [facility] ULTRA test strip  12/19/18   [provider]  sodium bicarbonate 650 MG tablet Take 1 tablet (650 mg total) by mouth 2 (two) times daily. 08/28/22 09/27/22  Sherryll Burger, Pratik D, DO  Vitamin D, Ergocalciferol, (DRISDOL) 1.25 MG (50000 UT) CAPS capsule Take 50,000 Units by mouth every Saturday. 11/16/18   [provider]    Physical Exam: BP (!) 114/46 (BP Location: Left Arm)   Pulse (!) 56   Temp 98 F (36.7 C) (Oral)    Resp 18   Ht 5\' 6"  (1.676 m)   Wt 94.8 kg   SpO2 100%   BMI 33.73 kg/m   General: 72 y.o. year-old female well developed well nourished in no acute distress.  Alert and oriented x3. HEENT: NCAT, EOMI Neck: Supple, trachea medial Cardiovascular: Regular rate and rhythm with no rubs or gallops.  No thyromegaly or JVD noted.  +3 lower extremity edema bilaterally. 2/4 pulses in all 4 extremities. Respiratory: Clear to auscultation with no wheezes or rales. Good inspiratory effort. Abdomen: Soft, nontender nondistended with normal bowel sounds x4 quadrants. Muskuloskeletal: No cyanosis, clubbing or edema noted bilaterally Neuro: CN II-XII intact, strength 5/5 x 4, sensation, reflexes intact Skin: No ulcerative lesions noted or rashes Psychiatry: Judgement and insight appear normal. Mood is appropriate for condition and setting          Labs on Admission:  Basic Metabolic Panel: Recent Labs  Lab 08/28/22 0410 09/03/22 1625  NA 134* 131*  K 3.7 3.8  CL 107 104  CO2 20* 19*  GLUCOSE 139* 143*  BUN 51* 61*  CREATININE 2.54* 3.87*  CALCIUM 7.8* 7.8*  MG 2.2 2.4   Liver Function Tests: Recent Labs  Lab 09/03/22 1625  AST 34  ALT 24  ALKPHOS 89  BILITOT 1.3*  PROT 5.1*  ALBUMIN 2.3*   No results for input(s): "LIPASE", "AMYLASE" in the last 168 hours. No results for input(s): "AMMONIA" in the last 168 hours. CBC: Recent Labs  Lab 09/03/22 1625  WBC 7.1  NEUTROABS 4.9  HGB 10.2*  HCT 30.6*  MCV 98.1  PLT 54*   Cardiac Enzymes: No results for input(s): "CKTOTAL", "CKMB", "CKMBINDEX", "TROPONINI" in the last 168 hours.  BNP (last 3 results) Recent Labs    09/03/22 1625  BNP 274.0*    ProBNP (last 3 results) No results for input(s): "PROBNP" in the last 8760 hours.  CBG: Recent Labs  Lab 08/27/22 2139 08/28/22 0737  GLUCAP 177* 144*    Radiological Exams on Admission: DG Chest 2 View  Result Date: 09/03/2022 CLINICAL DATA:  Fluid overload. EXAM:  CHEST - 2 VIEW COMPARISON:  08/17/2022. FINDINGS: Heart is normal in size and the mediastinal contour is within normal limits. There is atherosclerotic calcification of the aorta. Mild interstitial prominence is present bilaterally. No effusion or pneumothorax. Cervical spinal fusion hardware is noted no acute osseous abnormality. IMPRESSION: Mild interstitial prominence bilaterally, possible edema or infiltrate. Electronically  Signed   By: Thornell Sartorius M.D.   On: 09/03/2022 20:20    EKG: I independently viewed the EKG done and my findings are as followed: Sinus bradycardia at rate of 55 bpm with QTc 501 ms    Assessment/Plan Present on Admission:  Acute kidney injury superimposed on chronic kidney disease (HCC)  Thrombocytopenia (HCC)  Prolonged QT interval  Hypertension with renal disease  Type 2 diabetes mellitus with diabetic chronic kidney disease (HCC)  Acquired hypothyroidism  Principal Problem:   Acute kidney injury superimposed on chronic kidney disease (HCC) Active Problems:   Acquired hypothyroidism   Type 2 diabetes mellitus with diabetic chronic kidney disease (HCC)   Hypertension with renal disease   Thrombocytopenia (HCC)   Prolonged QT interval   Elevated brain natriuretic peptide (BNP) level   Hypoalbuminemia due to protein-calorie malnutrition (HCC)  Acute kidney injury superimposed on chronic kidney disease BUN/creatinine 61/3.87, this was 51/2.54 about 6 days ago Nephrologist will be consulted Renally adjust medications, avoid nephrotoxic agents/dehydration/hypotension  Elevated BNP, rule out CHF BNP 274, this may be due to worsening kidney status Chest x-ray showed mild interstitial prominence bilaterally, possible edema or infiltrate IV Lasix 40 mg x 1 was given in the ED Nephrologist consulted due to worsening kidney status Continue total input/output, daily weights and fluid restriction Continue heart healthy/carb modified diet  Echocardiogram in the  morning   Hypoalbuminemia possibly due to moderate protein calorie malnutrition Albumin 2.3, protein supplement will be provided  Thrombocytopenia possibly due to NASH cirrhosis of the liver Platelets 54, continue to monitor platelet levels  Prolonged QT interval (chronic) QTc 501 ms Avoid QT prolonging drugs Magnesium level 2.4 Repeat EKG in the morning  Hypertension with renal disease BP meds will be held at this time due to soft BP   Type 2 diabetes mellitus with diabetic chronic kidney disease (HCC) Hemoglobin A1c was 6.0 on 08/17/2022 Continue ISS and hypoglycemic protocol  Acquired hypothyroidism Continue Synthroid   DVT prophylaxis: SCDs  Advance Care Planning: Full code   Consults: Nephrology  Family Communication: Son at bedside (all questions answered to satisfaction)  Severity of Illness: The appropriate patient status for this patient is OBSERVATION. Observation status is judged to be reasonable and necessary in order to provide the required intensity of service to ensure the patient's safety. The patient's presenting symptoms, physical exam findings, and initial radiographic and laboratory data in the context of their medical condition is felt to place them at decreased risk for further clinical deterioration. Furthermore, it is anticipated that the patient will be medically stable for discharge from the hospital within 2 midnights of admission.   Author: Frankey Shown, DO 09/03/2022 9:24 PM  For on call review [email or web address].

## 2022-09-03 NOTE — ED Notes (Signed)
PVR [id number]

## 2022-09-03 NOTE — ED Triage Notes (Signed)
Pt presents to Ed from [facility] in [place] with son, says he took her to Dr Romero Belling who is pt diabetic Dr, pt was sent by this Dr to ED as foley cath has not been draining since it was changed out here last week.

## 2022-09-04 ENCOUNTER — Other Ambulatory Visit (HOSPITAL_COMMUNITY): Payer: Self-pay | Admitting: *Deleted

## 2022-09-04 ENCOUNTER — Inpatient Hospital Stay (HOSPITAL_COMMUNITY): Payer: Medicare Other

## 2022-09-04 ENCOUNTER — Observation Stay (HOSPITAL_COMMUNITY): Payer: Medicare Other

## 2022-09-04 ENCOUNTER — Encounter (HOSPITAL_COMMUNITY): Payer: Self-pay | Admitting: Hematology

## 2022-09-04 DIAGNOSIS — D696 Thrombocytopenia, unspecified: Secondary | ICD-10-CM | POA: Diagnosis not present

## 2022-09-04 DIAGNOSIS — Y732 Prosthetic and other implants, materials and accessory gastroenterology and urology devices associated with adverse incidents: Secondary | ICD-10-CM | POA: Diagnosis present

## 2022-09-04 DIAGNOSIS — E785 Hyperlipidemia, unspecified: Secondary | ICD-10-CM | POA: Diagnosis present

## 2022-09-04 DIAGNOSIS — E1122 Type 2 diabetes mellitus with diabetic chronic kidney disease: Secondary | ICD-10-CM | POA: Diagnosis present

## 2022-09-04 DIAGNOSIS — K7581 Nonalcoholic steatohepatitis (NASH): Secondary | ICD-10-CM | POA: Diagnosis present

## 2022-09-04 DIAGNOSIS — Z7189 Other specified counseling: Secondary | ICD-10-CM | POA: Diagnosis not present

## 2022-09-04 DIAGNOSIS — E46 Unspecified protein-calorie malnutrition: Secondary | ICD-10-CM

## 2022-09-04 DIAGNOSIS — E872 Acidosis, unspecified: Secondary | ICD-10-CM | POA: Diagnosis present

## 2022-09-04 DIAGNOSIS — N1832 Chronic kidney disease, stage 3b: Secondary | ICD-10-CM | POA: Diagnosis present

## 2022-09-04 DIAGNOSIS — E039 Hypothyroidism, unspecified: Secondary | ICD-10-CM

## 2022-09-04 DIAGNOSIS — D61818 Other pancytopenia: Secondary | ICD-10-CM | POA: Diagnosis present

## 2022-09-04 DIAGNOSIS — R188 Other ascites: Secondary | ICD-10-CM | POA: Diagnosis present

## 2022-09-04 DIAGNOSIS — E440 Moderate protein-calorie malnutrition: Secondary | ICD-10-CM | POA: Diagnosis present

## 2022-09-04 DIAGNOSIS — R600 Localized edema: Secondary | ICD-10-CM | POA: Diagnosis present

## 2022-09-04 DIAGNOSIS — R34 Anuria and oliguria: Secondary | ICD-10-CM | POA: Diagnosis present

## 2022-09-04 DIAGNOSIS — E871 Hypo-osmolality and hyponatremia: Secondary | ICD-10-CM | POA: Diagnosis not present

## 2022-09-04 DIAGNOSIS — Z66 Do not resuscitate: Secondary | ICD-10-CM | POA: Diagnosis not present

## 2022-09-04 DIAGNOSIS — E11319 Type 2 diabetes mellitus with unspecified diabetic retinopathy without macular edema: Secondary | ICD-10-CM | POA: Diagnosis present

## 2022-09-04 DIAGNOSIS — K7460 Unspecified cirrhosis of liver: Secondary | ICD-10-CM | POA: Diagnosis present

## 2022-09-04 DIAGNOSIS — K767 Hepatorenal syndrome: Secondary | ICD-10-CM | POA: Diagnosis not present

## 2022-09-04 DIAGNOSIS — Z515 Encounter for palliative care: Secondary | ICD-10-CM | POA: Diagnosis not present

## 2022-09-04 DIAGNOSIS — N179 Acute kidney failure, unspecified: Secondary | ICD-10-CM

## 2022-09-04 DIAGNOSIS — E1142 Type 2 diabetes mellitus with diabetic polyneuropathy: Secondary | ICD-10-CM | POA: Diagnosis present

## 2022-09-04 DIAGNOSIS — E8809 Other disorders of plasma-protein metabolism, not elsewhere classified: Secondary | ICD-10-CM | POA: Diagnosis present

## 2022-09-04 DIAGNOSIS — R9431 Abnormal electrocardiogram [ECG] [EKG]: Secondary | ICD-10-CM | POA: Diagnosis not present

## 2022-09-04 DIAGNOSIS — I5031 Acute diastolic (congestive) heart failure: Secondary | ICD-10-CM

## 2022-09-04 DIAGNOSIS — T83018A Breakdown (mechanical) of other indwelling urethral catheter, initial encounter: Secondary | ICD-10-CM | POA: Diagnosis present

## 2022-09-04 DIAGNOSIS — R7989 Other specified abnormal findings of blood chemistry: Secondary | ICD-10-CM | POA: Diagnosis not present

## 2022-09-04 DIAGNOSIS — Q211 Atrial septal defect, unspecified: Secondary | ICD-10-CM | POA: Diagnosis not present

## 2022-09-04 DIAGNOSIS — D631 Anemia in chronic kidney disease: Secondary | ICD-10-CM | POA: Diagnosis present

## 2022-09-04 DIAGNOSIS — N189 Chronic kidney disease, unspecified: Secondary | ICD-10-CM | POA: Diagnosis not present

## 2022-09-04 DIAGNOSIS — Z6832 Body mass index (BMI) 32.0-32.9, adult: Secondary | ICD-10-CM | POA: Diagnosis not present

## 2022-09-04 DIAGNOSIS — Z794 Long term (current) use of insulin: Secondary | ICD-10-CM | POA: Diagnosis not present

## 2022-09-04 DIAGNOSIS — I129 Hypertensive chronic kidney disease with stage 1 through stage 4 chronic kidney disease, or unspecified chronic kidney disease: Secondary | ICD-10-CM | POA: Diagnosis present

## 2022-09-04 LAB — COMPREHENSIVE METABOLIC PANEL
ALT: 20 U/L (ref 0–44)
AST: 30 U/L (ref 15–41)
Albumin: 2.1 g/dL — ABNORMAL LOW (ref 3.5–5.0)
Alkaline Phosphatase: 79 U/L (ref 38–126)
Anion gap: 10 (ref 5–15)
BUN: 65 mg/dL — ABNORMAL HIGH (ref 8–23)
CO2: 18 mmol/L — ABNORMAL LOW (ref 22–32)
Calcium: 7.7 mg/dL — ABNORMAL LOW (ref 8.9–10.3)
Chloride: 104 mmol/L (ref 98–111)
Creatinine, Ser: 3.92 mg/dL — ABNORMAL HIGH (ref 0.44–1.00)
GFR, Estimated: 12 mL/min — ABNORMAL LOW (ref >60)
Glucose, Bld: 111 mg/dL — ABNORMAL HIGH (ref 70–99)
Potassium: 3.7 mmol/L (ref 3.5–5.1)
Sodium: 132 mmol/L — ABNORMAL LOW (ref 135–145)
Total Bilirubin: 1.4 mg/dL — ABNORMAL HIGH (ref 0.3–1.2)
Total Protein: 4.6 g/dL — ABNORMAL LOW (ref 6.5–8.1)

## 2022-09-04 LAB — ECHOCARDIOGRAM COMPLETE
Area-P 1/2: 1.91 cm2
Height: 66 in
S' Lateral: 3 cm
Weight: 3354.52 oz

## 2022-09-04 LAB — CBC
HCT: 27.2 % — ABNORMAL LOW (ref 36.0–46.0)
Hemoglobin: 9.2 g/dL — ABNORMAL LOW (ref 12.0–15.0)
MCH: 33.2 pg (ref 26.0–34.0)
MCHC: 33.8 g/dL (ref 30.0–36.0)
MCV: 98.2 fL (ref 80.0–100.0)
Platelets: 34 10*3/uL — ABNORMAL LOW (ref 150–400)
RBC: 2.77 MIL/uL — ABNORMAL LOW (ref 3.87–5.11)
RDW: 15.6 % — ABNORMAL HIGH (ref 11.5–15.5)
WBC: 4.3 10*3/uL (ref 4.0–10.5)
nRBC: 0 % (ref 0.0–0.2)

## 2022-09-04 LAB — URINALYSIS, W/ REFLEX TO CULTURE (INFECTION SUSPECTED)
Bilirubin Urine: NEGATIVE
Glucose, UA: NEGATIVE mg/dL
Ketones, ur: NEGATIVE mg/dL
Nitrite: NEGATIVE
Protein, ur: NEGATIVE mg/dL
Specific Gravity, Urine: 1.008 (ref 1.005–1.030)
pH: 5 (ref 5.0–8.0)

## 2022-09-04 LAB — GLUCOSE, CAPILLARY
Glucose-Capillary: 102 mg/dL — ABNORMAL HIGH (ref 70–99)
Glucose-Capillary: 131 mg/dL — ABNORMAL HIGH (ref 70–99)
Glucose-Capillary: 172 mg/dL — ABNORMAL HIGH (ref 70–99)
Glucose-Capillary: 159 mg/dL — ABNORMAL HIGH (ref 70–99)

## 2022-09-04 LAB — MAGNESIUM: Magnesium: 2.4 mg/dL (ref 1.7–2.4)

## 2022-09-04 LAB — PHOSPHORUS: Phosphorus: 5 mg/dL — ABNORMAL HIGH (ref 2.5–4.6)

## 2022-09-04 MED ORDER — MIDODRINE HCL 5 MG PO TABS
5.0000 mg | ORAL_TABLET | Freq: Three times a day (TID) | ORAL | Status: DC
  Administered 2022-09-04 – 2022-09-10 (×16): 5 mg via ORAL
  Filled 2022-09-04 (×16): qty 1

## 2022-09-04 MED ORDER — ALBUMIN HUMAN 25 % IV SOLN
25.0000 g | Freq: Four times a day (QID) | INTRAVENOUS | Status: AC
  Administered 2022-09-04 – 2022-09-05 (×4): 25 g via INTRAVENOUS
  Filled 2022-09-04 (×4): qty 100

## 2022-09-04 MED ORDER — SODIUM CHLORIDE 0.9 % IV BOLUS
500.0000 mL | Freq: Once | INTRAVENOUS | Status: AC
  Administered 2022-09-04: 500 mL via INTRAVENOUS

## 2022-09-04 NOTE — Consult Note (Signed)
[facility]  HISTORY AND PHYSICAL  MAKAIA MOTTL is an 72 y.o. female.    Chief Complaint: Foley catheter problems  HPI: PT is a 89F with a PMH sig for bladder prolapse, cirrhosis, DM II, HTN, and CKD who is now seen in consultation at the request of Dr. Laural Benes for eval and recs re: AKI on CKD.    Pt was recently admitted to [facility] 5/28-5/31 for AKI in the setting of bactrim use and poor oral intake.  Prior to that she had a pessary placed a week prior to admission.  She required a Foley catether at that time d/t inability to urinate.  Baseline Cr appears to be 1.5, was up to 2.8 on admission and was 2.5 by the time she was discharged.   She was discharged to the SNF where initially Foley was draining well but then was noted not to be.  Had to be readjusted.  States was taking her Lasix but doesn't look like it's on the home med list.    TTE with normal EF and no WMA, CT scan with ascites but no gross obstruction.  Foley has been removed.  Part of pessary still in place.  I/O cath attempted yesterday- PVR showed 450 but only 100 came out.  Urology has been consulted.  In this setting we are asked to see.  Looks like BP is soft.      PMH: Past Medical History:  Diagnosis Date   Alkaline phosphatase elevation    Anemia    ASD (atrial septal defect)    Cataract    both eyes hx of   Chest pain    03-17-2013 last chest pain   Chronic kidney disease    Stage III kidney disease   Cirrhosis (HCC)    Depression    Diabetes mellitus type II    Family history of adverse reaction to anesthesia    Son hard to wake up   Fatigue    GERD (gastroesophageal reflux disease)    Gout    Headache(784.0)    occasional   Hyperlipidemia    Hypertension    Hypothyroidism    Neuropathy    Osteopenia    Peripheral neuropathy    Presence of pessary    Retinopathy    PSH: Past Surgical History:  Procedure Laterality Date   APPENDECTOMY     CATARACT EXTRACTION     CERVICAL SPINE  SURGERY  2006   CHOLECYSTECTOMY N/A 04/07/2013   Procedure: LAPAROSCOPIC CHOLECYSTECTOMY WITH INTRAOPERATIVE CHOLANGIOGRAM;  Surgeon: Robyne Askew, MD;  Location: [facility];  Service: General;  Laterality: N/A;   COMBINED HYSTERECTOMY VAGINAL / OOPHORECTOMY / A&P REPAIR  1987   Unilateral oophorectomy, h/o uterine prolapse has right ovary   ERCP N/A 04/06/2013   Procedure: ENDOSCOPIC RETROGRADE CHOLANGIOPANCREATOGRAPHY (ERCP);  Surgeon: Theda Belfast, MD;  Location: [facility];  Service: Endoscopy;  Laterality: N/A;   ESOPHAGEAL BANDING N/A 09/26/2021   Procedure: ESOPHAGEAL BANDING;  Surgeon: Jeani Hawking, MD;  Location: [facility];  Service: Gastroenterology;  Laterality: N/A;   ESOPHAGEAL BANDING N/A 10/31/2021   Procedure: ESOPHAGEAL BANDING;  Surgeon: Jeani Hawking, MD;  Location: [facility];  Service: Gastroenterology;  Laterality: N/A;   ESOPHAGOGASTRODUODENOSCOPY (EGD) WITH PROPOFOL N/A 08/16/2020   Procedure: ESOPHAGOGASTRODUODENOSCOPY (EGD) WITH PROPOFOL;  Surgeon: Jeani Hawking, MD;  Location: [facility];  Service: Endoscopy;  Laterality: N/A;   ESOPHAGOGASTRODUODENOSCOPY (EGD) WITH PROPOFOL N/A 09/26/2021   Procedure: ESOPHAGOGASTRODUODENOSCOPY (EGD) WITH PROPOFOL;  Surgeon: Elnoria Howard,  Luisa Hart, MD;  Location: [facility];  Service: Gastroenterology;  Laterality: N/A;   ESOPHAGOGASTRODUODENOSCOPY (EGD) WITH PROPOFOL N/A 10/31/2021   Procedure: ESOPHAGOGASTRODUODENOSCOPY (EGD) WITH PROPOFOL;  Surgeon: Jeani Hawking, MD;  Location: [facility];  Service: Gastroenterology;  Laterality: N/A;   EUS N/A 03/31/2013   Procedure: UPPER ENDOSCOPIC ULTRASOUND (EUS) LINEAR;  Surgeon: Theda Belfast, MD;  Location: [facility];  Service: Endoscopy;  Laterality: N/A;   REFRACTIVE SURGERY     SPINAL FUSION     c4-c7   TONSILLECTOMY  age 76    Past Medical History:  Diagnosis Date   Alkaline phosphatase elevation    Anemia    ASD (atrial septal defect)    Cataract    both eyes hx of    Chest pain    03-17-2013 last chest pain   Chronic kidney disease    Stage III kidney disease   Cirrhosis (HCC)    Depression    Diabetes mellitus type II    Family history of adverse reaction to anesthesia    Son hard to wake up   Fatigue    GERD (gastroesophageal reflux disease)    Gout    Headache(784.0)    occasional   Hyperlipidemia    Hypertension    Hypothyroidism    Neuropathy    Osteopenia    Peripheral neuropathy    Presence of pessary    Retinopathy     Medications:  Scheduled:  feeding supplement (GLUCERNA SHAKE)  237 mL Oral TID BM   insulin aspart  0-5 Units Subcutaneous QHS   insulin aspart  0-9 Units Subcutaneous TID WC   levothyroxine  125 mcg Oral Q0600    Medications Prior to Admission  Medication Sig Dispense Refill   allopurinol (ZYLOPRIM) 100 MG tablet Take 100 mg by mouth every evening.      cholecalciferol (VITAMIN D3) 25 MCG (1000 UNIT) tablet Take 1,000 Units by mouth once a week. Wednesday weekly     insulin regular (NOVOLIN R) 100 units/mL injection Inject 5-8 Units into the skin 3 (three) times daily before meals. Sliding scale 0-250=0 251-300=5 301-350=6 351-400=7units 401-450 8 units     nadolol (CORGARD) 40 MG tablet Take 40 mg by mouth daily.     neomycin-polymyxin b-dexamethasone (MAXITROL) 3.5-10000-0.1 OINT Place into the right eye 2 (two) times daily.     NOVOLIN N RELION 100 UNIT/ML injection Inject 14 Units into the skin 2 (two) times daily.     sodium bicarbonate 650 MG tablet Take 1 tablet (650 mg total) by mouth 2 (two) times daily. 60 tablet 0   Vitamin D, Ergocalciferol, (DRISDOL) 1.25 MG (50000 UT) CAPS capsule Take 50,000 Units by mouth every Saturday.     levothyroxine (SYNTHROID) 125 MCG tablet 1 tablet in the morning on an empty stomach Orally Once a day for 30 day(s)     ONETOUCH ULTRA test strip       ALLERGIES:   Allergies  Allergen Reactions   Bactrim [Sulfamethoxazole-Trimethoprim] Nausea And Vomiting and  Nausea Only   Ciprofloxacin      severe yeast infection   Crestor [Rosuvastatin]     arthralgia/myalgia   Lipitor [Atorvastatin]     arthralgia/myalgia   Lisinopril Cough   Losartan Potassium Cough   Lovastatin     arthralgia/myalgia   Statins Other (See Comments)    arthralgia/myalgia   Welchol [Colesevelam]     arthralgia/myalgia   Zetia [Ezetimibe]     arthralgia/myalgia   Dilaudid [Hydromorphone Hcl] Nausea  And Vomiting    After one vomiting episode no further vomiting    FAM HX: Family History  Problem Relation Age of Onset   COPD Mother    Lung cancer Father    Diabetes Sister    Cataracts Sister    Insulin resistance Daughter    Insulin resistance Son     Social History:   reports that she has never smoked. She has never used smokeless tobacco. She reports that she does not drink alcohol and does not use drugs.  ROS: ROS: all other systems are reviewed and are negative except as per HPI  Blood pressure (!) 96/44, pulse (!) 58, temperature 98 F (36.7 C), temperature source Oral, resp. rate 16, height 5\' 6"  (1.676 m), weight 95.1 kg, SpO2 99 %. PHYSICAL EXAM: Physical Exam GEN NAD, sitting up in bed HEENT EOMI PERRL NECK no JVD PULM clear CV RRR ABD soft, protuberant, + ascites EXT + 2+ LE edema NEURO AAO x 3 nonfocal SKIN no rashes/ lesions MSK   Results for orders placed or performed during the hospital encounter of 09/03/22 (from the past 48 hour(s))  CBC with Differential     Status: Abnormal   Collection Time: 09/03/22  4:25 PM  Result Value Ref Range   WBC 7.1 4.0 - 10.5 K/uL   RBC 3.12 (L) 3.87 - 5.11 MIL/uL   Hemoglobin 10.2 (L) 12.0 - 15.0 g/dL   HCT 40.9 (L) 81.1 - 91.4 %   MCV 98.1 80.0 - 100.0 fL   MCH 32.7 26.0 - 34.0 pg   MCHC 33.3 30.0 - 36.0 g/dL   RDW 78.2 (H) 95.6 - 21.3 %   Platelets 54 (L) 150 - 400 K/uL    Comment: Immature Platelet Fraction may be clinically indicated, consider ordering this additional test [id number]     nRBC 0.0 0.0 - 0.2 %   Neutrophils Relative % 70 %   Neutro Abs 4.9 1.7 - 7.7 K/uL   Lymphocytes Relative 17 %   Lymphs Abs 1.2 0.7 - 4.0 K/uL   Monocytes Relative 7 %   Monocytes Absolute 0.5 0.1 - 1.0 K/uL   Eosinophils Relative 6 %   Eosinophils Absolute 0.5 0.0 - 0.5 K/uL   Basophils Relative 0 %   Basophils Absolute 0.0 0.0 - 0.1 K/uL   WBC Morphology MORPHOLOGY UNREMARKABLE    RBC Morphology MORPHOLOGY UNREMARKABLE    Smear Review PLATELET COUNT CONFIRMED BY SMEAR    Immature Granulocytes 0 %   Abs Immature Granulocytes 0.02 0.00 - 0.07 K/uL    Comment: Performed at [facility], [address], [place], [place] [address]  Comprehensive metabolic panel     Status: Abnormal   Collection Time: 09/03/22  4:25 PM  Result Value Ref Range   Sodium 131 (L) 135 - 145 mmol/L   Potassium 3.8 3.5 - 5.1 mmol/L   Chloride 104 98 - 111 mmol/L   CO2 19 (L) 22 - 32 mmol/L   Glucose, Bld 143 (H) 70 - 99 mg/dL    Comment: Glucose reference range applies only to samples taken after fasting for at least 8 hours.   BUN 61 (H) 8 - 23 mg/dL   Creatinine, Ser 2.84 (H) 0.44 - 1.00 mg/dL   Calcium 7.8 (L) 8.9 - 10.3 mg/dL   Total Protein 5.1 (L) 6.5 - 8.1 g/dL   Albumin 2.3 (L) 3.5 - 5.0 g/dL   AST 34 15 - 41 U/L   ALT 24 0 - 44  U/L   Alkaline Phosphatase 89 38 - 126 U/L   Total Bilirubin 1.3 (H) 0.3 - 1.2 mg/dL   GFR, Estimated 12 (L) >60 mL/min    Comment: (NOTE) Calculated using the CKD-EPI Creatinine Equation (2021)    Anion gap 8 5 - 15    Comment: Performed at [facility], [address], [place], [place] [address]  Magnesium     Status: None   Collection Time: 09/03/22  4:25 PM  Result Value Ref Range   Magnesium 2.4 1.7 - 2.4 mg/dL    Comment: Performed at [facility], [address], [place], [place] [address]  Brain natriuretic peptide     Status: Abnormal   Collection Time: 09/03/22  4:25 PM  Result Value Ref Range   B Natriuretic Peptide 274.0 (H) 0.0 - 100.0 pg/mL     Comment: Performed at [facility], [address], [place], [place] [address]  Glucose, capillary     Status: Abnormal   Collection Time: 09/03/22  9:37 PM  Result Value Ref Range   Glucose-Capillary 113 (H) 70 - 99 mg/dL    Comment: Glucose reference range applies only to samples taken after fasting for at least 8 hours.  Urinalysis, w/ Reflex to Culture (Infection Suspected) -Urine, Clean Catch     Status: Abnormal   Collection Time: 09/04/22  4:22 AM  Result Value Ref Range   Specimen Source URINE, CLEAN CATCH    Color, Urine YELLOW YELLOW   APPearance HAZY (A) CLEAR   Specific Gravity, Urine 1.008 1.005 - 1.030   pH 5.0 5.0 - 8.0   Glucose, UA NEGATIVE NEGATIVE mg/dL   Hgb urine dipstick MODERATE (A) NEGATIVE   Bilirubin Urine NEGATIVE NEGATIVE   Ketones, ur NEGATIVE NEGATIVE mg/dL   Protein, ur NEGATIVE NEGATIVE mg/dL   Nitrite NEGATIVE NEGATIVE   Leukocytes,Ua LARGE (A) NEGATIVE   RBC / HPF 21-50 0 - 5 RBC/hpf   WBC, UA 21-50 0 - 5 WBC/hpf    Comment:        Reflex urine culture not performed if WBC <=10, OR if Squamous epithelial cells >5. If Squamous epithelial cells >5 suggest recollection.    Bacteria, UA RARE (A) NONE SEEN   Squamous Epithelial / HPF 0-5 0 - 5 /HPF   Budding Yeast PRESENT    Hyaline Casts, UA PRESENT    Uric Acid Crys, UA PRESENT     Comment: Performed at [facility], [address], [place], [place] [address]  Comprehensive metabolic panel     Status: Abnormal   Collection Time: 09/04/22  4:52 AM  Result Value Ref Range   Sodium 132 (L) 135 - 145 mmol/L   Potassium 3.7 3.5 - 5.1 mmol/L   Chloride 104 98 - 111 mmol/L   CO2 18 (L) 22 - 32 mmol/L   Glucose, Bld 111 (H) 70 - 99 mg/dL    Comment: Glucose reference range applies only to samples taken after fasting for at least 8 hours.   BUN 65 (H) 8 - 23 mg/dL   Creatinine, Ser 2.95 (H) 0.44 - 1.00 mg/dL   Calcium 7.7 (L) 8.9 - 10.3 mg/dL   Total Protein 4.6 (L) 6.5 - 8.1 g/dL   Albumin 2.1 (L)  3.5 - 5.0 g/dL   AST 30 15 - 41 U/L   ALT 20 0 - 44 U/L   Alkaline Phosphatase 79 38 - 126 U/L   Total Bilirubin 1.4 (H) 0.3 - 1.2 mg/dL   GFR, Estimated  12 (L) >60 mL/min    Comment: (NOTE) Calculated using the CKD-EPI Creatinine Equation (2021)    Anion gap 10 5 - 15    Comment: Performed at [facility], [address] [place], [place] [address]  CBC     Status: Abnormal   Collection Time: 09/04/22  4:52 AM  Result Value Ref Range   WBC 4.3 4.0 - 10.5 K/uL   RBC 2.77 (L) 3.87 - 5.11 MIL/uL   Hemoglobin 9.2 (L) 12.0 - 15.0 g/dL   HCT 57.8 (L) 46.9 - 62.9 %   MCV 98.2 80.0 - 100.0 fL   MCH 33.2 26.0 - 34.0 pg   MCHC 33.8 30.0 - 36.0 g/dL   RDW 52.8 (H) 41.3 - 24.4 %   Platelets 34 (L) 150 - 400 K/uL    Comment: SPECIMEN CHECKED FOR CLOTS Immature Platelet Fraction may be clinically indicated, consider ordering this additional test [id number] CONSISTENT WITH PREVIOUS RESULT DELTA CHECK NOTED    nRBC 0.0 0.0 - 0.2 %    Comment: Performed at [facility], [address] [place], [place] [address]  Magnesium     Status: None   Collection Time: 09/04/22  4:52 AM  Result Value Ref Range   Magnesium 2.4 1.7 - 2.4 mg/dL    Comment: Performed at [facility], [address] [place], [place] [address]  Phosphorus     Status: Abnormal   Collection Time: 09/04/22  4:52 AM  Result Value Ref Range   Phosphorus 5.0 (H) 2.5 - 4.6 mg/dL    Comment: Performed at [facility], [address] [place], [place] [address]  Glucose, capillary     Status: Abnormal   Collection Time: 09/04/22  7:25 AM  Result Value Ref Range   Glucose-Capillary 102 (H) 70 - 99 mg/dL    Comment: Glucose reference range applies only to samples taken after fasting for at least 8 hours.  Glucose, capillary     Status: Abnormal   Collection Time: 09/04/22 11:31 AM  Result Value Ref Range   Glucose-Capillary 131 (H) 70 - 99 mg/dL    Comment: Glucose reference range applies only to samples taken after fasting for  at least 8 hours.    CT RENAL STONE STUDY  Result Date: 09/04/2022 CLINICAL DATA:  Bladder neck obstruction, foreign body. EXAM: CT ABDOMEN AND PELVIS WITHOUT CONTRAST TECHNIQUE: Multidetector CT imaging of the abdomen and pelvis was performed following the standard protocol without IV contrast. RADIATION DOSE REDUCTION: This exam was performed according to the departmental dose-optimization program which includes automated exposure control, adjustment of the mA and/or kV according to patient size and/or use of iterative reconstruction technique. COMPARISON:  Aug 25, 2022. FINDINGS: Lower chest: No acute abnormality. Hepatobiliary: Nodular hepatic contours are noted consistent with hepatic cirrhosis. Status post cholecystectomy. No biliary dilatation is noted. Pancreas: Diffuse pancreatic atrophy is noted. Spleen: Mild splenomegaly. Adrenals/Urinary Tract: Adrenal glands are unremarkable. Kidneys are normal, without renal calculi, focal lesion, or hydronephrosis. Bladder is unremarkable. Stomach/Bowel: Stomach is unremarkable. There is no evidence of bowel obstruction or inflammation. Vascular/Lymphatic: Aortic atherosclerosis. No enlarged abdominal or pelvic lymph nodes. Left upper quadrant varices are noted consistent with portal hypertension. Reproductive: Pessary is again noted. Status post hysterectomy. No adnexal abnormality. Other: Mild to moderate ascites is noted. No definite hernia is noted. Musculoskeletal: Stable old L4 compression fracture is noted. No acute osseous abnormality is noted. IMPRESSION: Hepatic cirrhosis with mild splenomegaly and ascites. Enlarged varices are noted in left upper quadrant  consistent with portal hypertension. Aortic Atherosclerosis (ICD10-I70.0). Electronically Signed   By: Lupita Raider M.D.   On: 09/04/2022 13:08   ECHOCARDIOGRAM COMPLETE  Result Date: 09/04/2022    ECHOCARDIOGRAM REPORT   Patient Name:   CHRYS LANDGREBE Date of Exam: 09/04/2022 Medical Rec #:   [id number]     Height:       66.0 in Accession #:    [id number]    Weight:       209.7 lb Date of Birth:  [birth year]     BSA:          2.040 m Patient Age:    71 years      BP:           96/44 mmHg Patient Gender: F             HR:           58 bpm. [facility] Procedure: 2D Echo, Cardiac Doppler and Color Doppler Indications:    CHF-Acute Diastolic I50.31  History:        Patient has prior history of Echocardiogram examinations, most                 recent 02/12/2010. Risk Factors:Hypertension, Diabetes and                 Dyslipidemia.  Sonographer:    Celesta Gentile RCS Referring Phys: [id number] OLADAPO ADEFESO IMPRESSIONS  1. Left ventricular ejection fraction, by estimation, is 60 to 65%. The left ventricle has normal function. The left ventricle has no regional wall motion abnormalities. There is mild left ventricular hypertrophy. Left ventricular diastolic parameters are indeterminate.  2. Right ventricular systolic function is normal. The right ventricular size is normal.  3. Left atrial size was mild to moderately dilated.  4. The mitral valve is abnormal. Trivial mitral valve regurgitation. No evidence of mitral stenosis. Severe mitral annular calcification.  5. The aortic valve has an indeterminant number of cusps. There is moderate calcification of the aortic valve. Aortic valve regurgitation is not visualized. No aortic stenosis is present.  6. The inferior vena cava is dilated but collapsibility is not well visualized. Comparison(s): No prior Echocardiogram. FINDINGS  Left Ventricle: Left ventricular ejection fraction, by estimation, is 60 to 65%. The left ventricle has normal function. The left ventricle has no regional wall motion abnormalities. The left ventricular internal cavity size was normal in size. There is  mild left ventricular hypertrophy. Left ventricular diastolic parameters are indeterminate. Right Ventricle: The right ventricular size is normal. No increase in right  ventricular wall thickness. Right ventricular systolic function is normal. Left Atrium: Left atrial size was mild to moderately dilated. Right Atrium: Right atrial size was normal in size. Pericardium: There is no evidence of pericardial effusion. Mitral Valve: The mitral valve is abnormal. Severe mitral annular calcification. Trivial mitral valve regurgitation. No evidence of mitral valve stenosis. Tricuspid Valve: The tricuspid valve is normal in structure. Tricuspid valve regurgitation is mild . No evidence of tricuspid stenosis. Aortic Valve: The aortic valve has an indeterminant number of cusps. There is moderate calcification of the aortic valve. Aortic valve regurgitation is not visualized. No aortic stenosis is present. Pulmonic Valve: The pulmonic valve was not well visualized. Pulmonic valve regurgitation is not visualized. No evidence of pulmonic stenosis. Aorta: The aortic root is normal in size and structure. Venous: The inferior vena cava is dilated but collapsibility is not well visualized. IAS/Shunts: No atrial level shunt  detected by color flow Doppler. Additional Comments: Moderate ascites is present.  LEFT VENTRICLE PLAX 2D LVIDd:         4.70 cm   Diastology LVIDs:         3.00 cm   LV e' medial:    5.11 cm/s LV PW:         1.20 cm   LV E/e' medial:  22.9 LV IVS:        1.00 cm   LV e' lateral:   8.92 cm/s LVOT diam:     1.90 cm   LV E/e' lateral: 13.1 LV SV:         82 LV SV Index:   40 LVOT Area:     2.84 cm  RIGHT VENTRICLE RV S prime:     13.30 cm/s TAPSE (M-mode): 2.3 cm LEFT ATRIUM           Index        RIGHT ATRIUM           Index LA diam:      3.70 cm 1.81 cm/m   RA Area:     13.90 cm LA Vol (A2C): 50.7 ml 24.85 ml/m  RA Volume:   31.80 ml  15.59 ml/m LA Vol (A4C): 82.4 ml 40.39 ml/m  AORTIC VALVE LVOT Vmax:   105.00 cm/s LVOT Vmean:  75.900 cm/s LVOT VTI:    0.288 m  AORTA Ao Root diam: 3.30 cm MITRAL VALVE                TRICUSPID VALVE MV Area (PHT): 1.91 cm     TR Peak grad:    18.3 mmHg MV Decel Time: 398 msec     TR Vmax:        214.00 cm/s MV E velocity: 117.00 cm/s MV A velocity: 102.00 cm/s  SHUNTS MV E/A ratio:  1.15         Systemic VTI:  0.29 m                             Systemic Diam: 1.90 cm Vishnu Priya Mallipeddi Electronically signed by Winfield Rast Mallipeddi Signature Date/Time: 09/04/2022/11:32:03 AM    Final    DG Chest 2 View  Result Date: 09/03/2022 CLINICAL DATA:  Fluid overload. EXAM: CHEST - 2 VIEW COMPARISON:  08/17/2022. FINDINGS: Heart is normal in size and the mediastinal contour is within normal limits. There is atherosclerotic calcification of the aorta. Mild interstitial prominence is present bilaterally. No effusion or pneumothorax. Cervical spinal fusion hardware is noted no acute osseous abnormality. IMPRESSION: Mild interstitial prominence bilaterally, possible edema or infiltrate. Electronically Signed   By: Thornell Sartorius M.D.   On: 09/03/2022 20:20    Assessment/Plan  Aki on CKD 3b: recurrent AKI in the setting of multiple potential insults- intermittent obstruction, Bactrim use, soft BP, intravascular depletion.  CT scan without gross obstruction but likely intermittently obstructed.    - appreciate urology expertise  - appreciate gynecology expertise  - BP is low and with multiple insults need to increase effective arterial blood flow- will stop nadolol, start a little midodrine, and give albumin, almost like we are treating HRS  - no indication for HD at present  - agree with holding Lasix  - ? If LVP warranted now but if decided upon would limit to 3-4 L and give albumin with it  2.  NASH cirrhosis  - hold nadolol as above  3.  Bladder prolapse:  - urology consulted, appreciate assistance  4.  Dispo: admitted  Bufford Buttner 09/04/2022, 2:09 PM

## 2022-09-04 NOTE — Progress Notes (Addendum)
Patient had a PVR of . MD notified In and out cath ordered.   0600 In and out cath patient was only able to get out 100cc of urine. The flow of urine just stopped. MD notified. MD also notified of patients blood pressure of 95/41. No new orders at this time.

## 2022-09-04 NOTE — Hospital Course (Signed)
72 y.o. female with medical history significant of NASH liver cirrhosis, T2DM, essential hypertension, hypothyroidism, chronic kidney disease stage III who presents to the emergency department from [facility] in [place] due to lower extremity edema and non-draining Foley.  She was accompanied by son at bedside. Patient was recently seen by her urologist due to chronic vaginal/bladder prolapse and a pessary was placed, a small laceration was noted on the prolapsed bladder, this was repaired and a Foley catheter was placed to allow the bladder to rest while healing.  The Foley drained partially on that day, but patient returned to the urologist and the Foley was readjusted, unfortunately, it was still not draining well.  She was seen in the emergency department on 5/23 due to UTI whereby, IV Rocephin was given in the ED.  Foley catheter was changed due to urine draining around the catheter.  Apparently, patient has been prescribed with Bactrim by her gynecologist, she was advised to continue with the medication, she was discharged home from the ED.  She complained of increased leg swelling and increased abdominal girth which she thought was due to increased fluid due to ascites/cirrhosis.  Patient also complained of shortness of breath within the last 2 days.  She went to see her endocrinologist today and she was sent to the ED for diuresis and Foley catheter replacement.  Patient was recently admitted from 5/28 to 5/31 due to acute kidney injury superimposed on chronic kidney disease   ED Course:  In the emergency department, HR was 55, BP 111/56 and other vital signs are within normal range.  Workup in the ED showed WBC 7.1, hemoglobin 10.2, hematocrit 30.6, MCV 98.1, platelets 54 BMP showed sodium 131, potassium 3.8, chloride 104, bicarb 19, glucose 143, BUN 61, creatinine 3.87, albumin 2.3, BNP 274, magnesium 2.4 She was treated with IV Lasix 40 mg x 1.  Hospitalist was asked to admit patient for further  evaluation and management.

## 2022-09-04 NOTE — Progress Notes (Signed)
Bladder scan showed 791. MD notified and stated Dr. Ronne Binning will see pt this evening no new orders received at this time. Pt denies any pain or discomfort at this time.

## 2022-09-04 NOTE — Progress Notes (Signed)
*  PRELIMINARY RESULTS* Echocardiogram 2D Echocardiogram has been performed.  [name] 09/04/2022, 11:19 AM

## 2022-09-04 NOTE — [place] FL2 (Signed)
[place] MEDICAID FL2 LEVEL OF CARE FORM     IDENTIFICATION  Patient Name: [name] Birthdate: [birth year] Sex: female Admission Date (Current Location): 09/03/2022  [facility] and [place] Number:  [employer] and Address:  [facility],  [address], [place] [address]      Provider Number: [id number]  Attending Physician Name and Address:  Cleora Fleet, MD  Relative Name and Phone Number:       Current Level of Care: Hospital Recommended Level of Care: Skilled Nursing Facility Prior Approval Number:    Date Approved/Denied:   PASRR Number: [id number]  Discharge Plan: SNF    Current Diagnoses: Patient Active Problem List   Diagnosis Date Noted   Acute renal failure superimposed on stage 3b chronic kidney disease (HCC) 09/04/2022   Elevated brain natriuretic peptide (BNP) level 09/03/2022   Hypoalbuminemia due to protein-calorie malnutrition (HCC) 09/03/2022   AKI (acute kidney injury) (HCC) 08/26/2022   Acute kidney injury superimposed on chronic kidney disease (HCC) 08/25/2022   Prolonged QT interval 08/25/2022   Vaginal prolapse 08/25/2022   Thrombocytopenia (HCC) 07/30/2017   CKD stage 3 due to type 2 diabetes mellitus (HCC) 06/30/2017   Liver cirrhosis secondary to NASH (HCC) 06/30/2017   Hypertension with renal disease 03/19/2017   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/19/2017   Tremor 08/06/2016   Vitreous hemorrhage of left eye (HCC) 01/29/2015   Diabetic macular edema (HCC) 12/25/2014   Hypertensive retinopathy of both eyes 12/25/2014   Cholecystitis 04/05/2013   Proliferative diabetic retinopathy (HCC) 08/04/2011   Bilateral nondiabetic proliferative retinopathy 04/03/2011   Type 2 diabetes mellitus with diabetic chronic kidney disease (HCC) 02/05/2010   CHEST PAIN 02/05/2010   Acquired hypothyroidism 03/20/2008   HYPERLIPIDEMIA 03/20/2008   RENAL DISEASE, CHRONIC, STAGE II 03/20/2008   DEPRESSION  02/21/2008   PERIPHERAL NEUROPATHY 02/21/2008   Pseudophakia of both eyes 02/21/2008   GERD 02/21/2008    Orientation RESPIRATION BLADDER Height & Weight     Self, Time, Situation, Place  Normal Indwelling catheter Weight: 209 lb 10.5 oz (95.1 kg) Height:  5\' 6"  (167.6 cm)  BEHAVIORAL SYMPTOMS/MOOD NEUROLOGICAL BOWEL NUTRITION STATUS      Continent Diet (Heart healthy/carb modified)  AMBULATORY STATUS COMMUNICATION OF NEEDS Skin   Extensive Assist Verbally Normal                       Personal Care Assistance Level of Assistance  Bathing, Feeding, Dressing Bathing Assistance: Limited assistance Feeding assistance: Independent Dressing Assistance: Limited assistance     Functional Limitations Info  Sight, Hearing, Speech Sight Info: Adequate Hearing Info: Impaired Speech Info: Adequate    SPECIAL CARE FACTORS FREQUENCY  PT (By licensed PT), OT (By licensed OT)     PT Frequency: 5 times weekly OT Frequency: 5 times weekly            Contractures Contractures Info: Not present    Additional Factors Info  Code Status, Allergies Code Status Info: FULL Allergies Info: Ciprofloxacin, Crestor (Rosuvastatin), Lipitor (Atorvastatin), Lisinopril, Losartan Potassium, Lovastatin, Statins, Welchol (Colesevelam), Zetia (Ezetimibe), Dilaudid (Hydromorphone Hcl)           Current Medications (09/04/2022):  This is the current hospital active medication list Current Facility-Administered Medications  Medication Dose Route Frequency Provider Last Rate Last Admin   acetaminophen (TYLENOL) tablet 650 mg  650 mg Oral Q6H PRN Adefeso, Oladapo, DO       Or  acetaminophen (TYLENOL) suppository 650 mg  650 mg Rectal Q6H PRN Adefeso, Oladapo, DO       feeding supplement (GLUCERNA SHAKE) (GLUCERNA SHAKE) liquid 237 mL  237 mL Oral TID BM Adefeso, Oladapo, DO       insulin aspart (novoLOG) injection 0-5 Units  0-5 Units Subcutaneous QHS Adefeso, Oladapo, DO       insulin aspart  (novoLOG) injection 0-9 Units  0-9 Units Subcutaneous TID WC Adefeso, Oladapo, DO       levothyroxine (SYNTHROID) tablet 125 mcg  125 mcg Oral Q0600 Adefeso, Oladapo, DO   125 mcg at 09/04/22 1610   prochlorperazine (COMPAZINE) injection 10 mg  10 mg Intravenous Q6H PRN Frankey Shown, DO         Discharge Medications: Please see discharge summary for a list of discharge medications.  Relevant Imaging Results:  Relevant Lab Results:   Additional Information SSN: [id number] [place]

## 2022-09-04 NOTE — TOC Initial Note (Signed)
Transition of Care [facility]) - Initial/Assessment Note    Patient Details  Name: [name] MRN: [id number] Date of Birth: [birth year]  Transition of Care [facility]) CM/SW Contact:    Villa Herb, LCSWA Phone Number: 09/04/2022, 12:42 PM  Clinical Narrative:                 CSW noted per chart review that pt arrived to ED [facility]. CSW spoke with pt and son who state that plan is for return to SNF at D/C. CSW requested that MD place a PT order for eval. TOC to start atuh once PT recommendation is in. TOC to follow.   Expected Discharge Plan: Skilled Nursing Facility Barriers to Discharge: Continued Medical Work up   Patient Goals and CMS Choice Patient states their goals for this hospitalization and ongoing recovery are:: return to SNF CMS Medicare.gov Compare Post Acute Care list provided to:: Patient Choice offered to / list presented to : Patient [place] ownership interest in [facility].provided to:: Patient    Expected Discharge Plan and Services In-house Referral: Clinical Social Work Discharge Planning Services: CM Consult Post Acute Care Choice: Skilled Nursing Facility Living arrangements for the past 2 months: Single Family Home                                      Prior Living Arrangements/Services Living arrangements for the past 2 months: Single Family Home Lives with:: Self Patient language and need for interpreter reviewed:: Yes Do you feel safe going back to the place where you live?: Yes      Need for Family Participation in Patient Care: Yes (Comment) Care giver support system in place?: Yes (comment)   Criminal Activity/Legal Involvement Pertinent to Current Situation/Hospitalization: No - Comment as needed  Activities of Daily Living Home Assistive Devices/Equipment: Walker (specify type), Dentures (specify type), Hearing aid (rollater) ADL Screening (condition at time of admission) Patient's cognitive ability  adequate to safely complete daily activities?: Yes Is the patient deaf or have difficulty hearing?: No Does the patient have difficulty seeing, even when wearing glasses/contacts?: No Does the patient have difficulty concentrating, remembering, or making decisions?: No Patient able to express need for assistance with ADLs?: Yes Does the patient have difficulty dressing or bathing?: No Independently performs ADLs?: Yes (appropriate for developmental age) Does the patient have difficulty walking or climbing stairs?: Yes Weakness of Legs: Both Weakness of Arms/Hands: None  Permission Sought/Granted                  Emotional Assessment Appearance:: Appears stated age Attitude/Demeanor/Rapport: Engaged Affect (typically observed): Accepting Orientation: : Oriented to Self, Oriented to  Time, Oriented to Place, Oriented to Situation Alcohol / Substance Use: Not Applicable Psych Involvement: No (comment)  Admission diagnosis:  Peripheral edema [R60.0] AKI (acute kidney injury) (HCC) [N17.9] Malfunction of Foley catheter, initial encounter (HCC) [T83.011A] Acute kidney injury superimposed on chronic kidney disease (HCC) [N17.9, N18.9] Acute renal failure superimposed on stage 3b chronic kidney disease (HCC) [N17.9, N18.32] Patient Active Problem List   Diagnosis Date Noted   Acute renal failure superimposed on stage 3b chronic kidney disease (HCC) 09/04/2022   Elevated brain natriuretic peptide (BNP) level 09/03/2022   Hypoalbuminemia due to protein-calorie malnutrition (HCC) 09/03/2022   AKI (acute kidney injury) (HCC) 08/26/2022   Acute kidney injury superimposed on chronic kidney disease (HCC) 08/25/2022  Prolonged QT interval 08/25/2022   Vaginal prolapse 08/25/2022   Thrombocytopenia (HCC) 07/30/2017   CKD stage 3 due to type 2 diabetes mellitus (HCC) 06/30/2017   Liver cirrhosis secondary to NASH (HCC) 06/30/2017   Hypertension with renal disease 03/19/2017   Idiopathic  chronic gout of foot without tophus 03/19/2017   Tremor 08/06/2016   Vitreous hemorrhage of left eye (HCC) 01/29/2015   Diabetic macular edema (HCC) 12/25/2014   Hypertensive retinopathy of both eyes 12/25/2014   Cholecystitis 04/05/2013   Proliferative diabetic retinopathy (HCC) 08/04/2011   Bilateral nondiabetic proliferative retinopathy 04/03/2011   Type 2 diabetes mellitus with diabetic chronic kidney disease (HCC) 02/05/2010   CHEST PAIN 02/05/2010   Acquired hypothyroidism 03/20/2008   HYPERLIPIDEMIA 03/20/2008   RENAL DISEASE, CHRONIC, STAGE II 03/20/2008   DEPRESSION 02/21/2008   PERIPHERAL NEUROPATHY 02/21/2008   Pseudophakia of both eyes 02/21/2008   GERD 02/21/2008   PCP:  Benita Stabile, MD Pharmacy:   [facility] [address] [place] - [address] [place] [address] Phone: [phone] Fax: [phone]     Social Determinants of Health (SDOH) Social History: SDOH Screenings   Food Insecurity: No Food Insecurity (09/03/2022)  Housing: Low Risk  (09/03/2022)  Transportation Needs: No Transportation Needs (09/03/2022)  Utilities: Not At Risk (09/03/2022)  Alcohol Screen: Low Risk  (10/30/2019)  Depression (PHQ2-9): Medium Risk (10/30/2019)  Financial Resource Strain: Medium Risk (10/30/2019)  Physical Activity: Inactive (10/30/2019)  Social Connections: Moderately Integrated (10/30/2019)  Stress: No Stress Concern Present (10/30/2019)  Tobacco Use: Low Risk  (09/03/2022)   SDOH Interventions:     Readmission Risk Interventions    09/04/2022   12:38 PM  Readmission Risk Prevention Plan  Transportation Screening Complete  HRI or Home Care Consult Complete  Social Work Consult for Recovery Care Planning/Counseling Complete  Palliative Care Screening Complete  Medication Review Oceanographer) Complete

## 2022-09-04 NOTE — Progress Notes (Signed)
PROGRESS NOTE   [name]  ZOX:[id number] DOB: 1950/04/09 DOA: 09/03/2022 PCP: Brandy Stabile, MD   Chief Complaint  Patient presents with   foley catheter not draining   Level of care: Telemetry  Brief Admission History:   72 y.o. female with medical history significant of NASH liver cirrhosis, T2DM, essential hypertension, hypothyroidism, chronic kidney disease stage III who presents to the emergency department from [facility] in [place] due to lower extremity edema and non-draining Foley.  She was accompanied by son at bedside. Patient was recently seen by her urologist due to chronic vaginal/bladder prolapse and a pessary was placed, a small laceration was noted on the prolapsed bladder, this was repaired and a Foley catheter was placed to allow the bladder to rest while healing.  The Foley drained partially on that day, but patient returned to the urologist and the Foley was readjusted, unfortunately, it was still not draining well.  She was seen in the emergency department on 5/23 due to UTI whereby, IV Rocephin was given in the ED.  Foley catheter was changed due to urine draining around the catheter.  Apparently, patient has been prescribed with Bactrim by her gynecologist, she was advised to continue with the medication, she was discharged home from the ED.  She complained of increased leg swelling and increased abdominal girth which she thought was due to increased fluid due to ascites/cirrhosis.  Patient also complained of shortness of breath within the last 2 days.  She went to see her endocrinologist today and she was sent to the ED for diuresis and Foley catheter replacement.  Patient was recently admitted from 5/28 to 5/31 due to acute kidney injury superimposed on chronic kidney disease   ED Course:  In the emergency department, HR was 55, BP 111/56 and other vital signs are within normal range.  Workup in the ED showed WBC 7.1, hemoglobin 10.2, hematocrit 30.6, MCV 98.1, platelets 54  BMP showed sodium 131, potassium 3.8, chloride 104, bicarb 19, glucose 143, BUN 61, creatinine 3.87, albumin 2.3, BNP 274, magnesium 2.4 She was treated with IV Lasix 40 mg x 1.  Hospitalist was asked to admit patient for further evaluation and management.   Assessment and Plan:  Acute kidney injury superimposed on chronic kidney disease stage 3b  Pt does not seem to be making much urine and on CT scan bladder empty Nephrologist consulted Renally adjust medications, avoid nephrotoxic agents/dehydration/hypotension Moderate ascites noted on CT scan, discussed with Dr. Ronne Haynes will proceed with paracentesis if possible Nephrology requested to limit to 3-4L and give with albumin  Prolapsed Bladder with pessary in place -urologist Dr. 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 Platelets 54, continue to monitor platelet levels   Prolonged QT interval (chronic) QTc 501 ms Avoid QT prolonging drugs Magnesium level 2.4 Repeat EKG in the morning   Hypertension with renal disease BP meds will be held at this time due to soft BP   Type 2 diabetes  mellitus with diabetic chronic kidney disease (HCC) Hemoglobin A1c was 6.0 on 08/17/2022 Continue ISS and hypoglycemic protocol   Acquired hypothyroidism Continue Synthroid   DVT prophylaxis: SCDs Code Status: Full  Family Communication: sister at bedside 6/7  Disposition: Status is:  Inpatient Remains inpatient appropriate because: IV treatments required, urology, nephrology consultation   Consultants:   Procedures:   Antimicrobials:    Subjective: Pt reports very minimal urine output.    Objective: Vitals:   09/04/22 0604 09/04/22 0744 09/04/22 1400 09/04/22 1551  BP: (!) 95/41 (!) 96/44 (!) 96/49 (!) 105/46  Pulse: (!) 58   (!) 53  Resp: 16  16 20   Temp: 98 F (36.7 C)   97.7 F (36.5 C)  TempSrc: Oral   Oral  SpO2: 99%  100% 100%  Weight: 95.1 kg     Height:        Intake/Output Summary (Last 24 hours) at 09/04/2022 1620 Last data filed at 09/04/2022 1500 Gross per 24 hour  Intake 120 ml  Output 400 ml  Net -280 ml   Filed Weights   09/03/22 1554 09/03/22 2054 09/04/22 0604  Weight: 91.4 kg 94.8 kg 95.1 kg   Examination:  General exam: Appears calm and comfortable, appears chronically ill.  Respiratory system: Clear to auscultation. Respiratory effort normal. Cardiovascular system: normal S1 & S2 heard. No JVD, murmurs, rubs, gallops or clicks. No pedal edema. Gastrointestinal system: Abdomen is nondistended, soft and nontender. No organomegaly or masses felt. Normal bowel sounds heard. Central nervous system: Alert and oriented. No focal neurological deficits. Extremities: Symmetric 5 x 5 power. Skin: No rashes, lesions or ulcers. Psychiatry: Judgement and insight appear normal. Mood & affect appropriate.   Data Reviewed: I have personally reviewed following labs and imaging studies  CBC: Recent Labs  Lab 09/03/22 1625 09/04/22 0452  WBC 7.1 4.3  NEUTROABS 4.9  --   HGB 10.2* 9.2*  HCT 30.6* 27.2*  MCV 98.1 98.2  PLT 54* 34*    Basic Metabolic Panel: Recent Labs  Lab 09/03/22 1625 09/04/22 0452  NA 131* 132*  K 3.8 3.7  CL 104 104  CO2 19* 18*  GLUCOSE 143* 111*  BUN 61* 65*  CREATININE 3.87* 3.92*  CALCIUM 7.8* 7.7*  MG 2.4 2.4  PHOS  --  5.0*    CBG: Recent Labs  Lab 09/03/22 2137 09/04/22 0725 09/04/22 1131   GLUCAP 113* 102* 131*    No results found for this or any previous visit (from the past 240 hour(s)).   Radiology Studies: CT RENAL STONE STUDY  Result Date: 09/04/2022 CLINICAL DATA:  Bladder neck obstruction, foreign body. EXAM: CT ABDOMEN AND PELVIS WITHOUT CONTRAST TECHNIQUE: Multidetector CT imaging of the abdomen and pelvis was performed following the standard protocol without IV contrast. RADIATION DOSE REDUCTION: This exam was performed according to the departmental dose-optimization program which includes automated exposure control, adjustment of the mA and/or kV according to patient size and/or use of iterative reconstruction technique. COMPARISON:  Aug 25, 2022. FINDINGS: Lower chest: No acute abnormality. Hepatobiliary: Nodular hepatic contours are noted consistent with hepatic cirrhosis. Status post cholecystectomy. No biliary dilatation is noted. Pancreas: Diffuse pancreatic atrophy is noted. Spleen: Mild splenomegaly. Adrenals/Urinary Tract: Adrenal glands are unremarkable. Kidneys are normal, without renal calculi, focal lesion, or hydronephrosis. Bladder is unremarkable. Stomach/Bowel: Stomach is unremarkable. There is no evidence of bowel obstruction or inflammation. Vascular/Lymphatic: Aortic atherosclerosis. No enlarged abdominal or pelvic lymph nodes. Left upper quadrant varices are noted consistent with  portal hypertension. Reproductive: Pessary is again noted. Status post hysterectomy. No adnexal abnormality. Other: Mild to moderate ascites is noted. No definite hernia is noted. Musculoskeletal: Stable old L4 compression fracture is noted. No acute osseous abnormality is noted. IMPRESSION: Hepatic cirrhosis with mild splenomegaly and ascites. Enlarged varices are noted in left upper quadrant consistent with portal hypertension. Aortic Atherosclerosis (ICD10-I70.0). Electronically Signed   By: Lupita Raider M.D.   On: 09/04/2022 13:08   ECHOCARDIOGRAM COMPLETE  Result Date:  09/04/2022    ECHOCARDIOGRAM REPORT   Patient Name:   [name] Date of Exam: 09/04/2022 Medical Rec #:  [id number]     Height:       66.0 in Accession #:    [id number]    Weight:       209.7 lb Date of Birth:  [birth year]     BSA:          2.040 m Patient Age:    71 years      BP:           96/44 mmHg Patient Gender: F             HR:           58 bpm. [facility] Procedure: 2D Echo, Cardiac Doppler and Color Doppler Indications:    CHF-Acute Diastolic I50.31  History:        Patient has prior history of Echocardiogram examinations, most                 recent 02/12/2010. Risk Factors:Hypertension, Diabetes and                 Dyslipidemia.  Sonographer:    Celesta Gentile RCS Referring Phys: [id number] OLADAPO ADEFESO IMPRESSIONS  1. Left ventricular ejection fraction, by estimation, is 60 to 65%. The left ventricle has normal function. The left ventricle has no regional wall motion abnormalities. There is mild left ventricular hypertrophy. Left ventricular diastolic parameters are indeterminate.  2. Right ventricular systolic function is normal. The right ventricular size is normal.  3. Left atrial size was mild to moderately dilated.  4. The mitral valve is abnormal. Trivial mitral valve regurgitation. No evidence of mitral stenosis. Severe mitral annular calcification.  5. The aortic valve has an indeterminant number of cusps. There is moderate calcification of the aortic valve. Aortic valve regurgitation is not visualized. No aortic stenosis is present.  6. The inferior vena cava is dilated but collapsibility is not well visualized. Comparison(s): No prior Echocardiogram. FINDINGS  Left Ventricle: Left ventricular ejection fraction, by estimation, is 60 to 65%. The left ventricle has normal function. The left ventricle has no regional wall motion abnormalities. The left ventricular internal cavity size was normal in size. There is  mild left ventricular hypertrophy. Left ventricular diastolic  parameters are indeterminate. Right Ventricle: The right ventricular size is normal. No increase in right ventricular wall thickness. Right ventricular systolic function is normal. Left Atrium: Left atrial size was mild to moderately dilated. Right Atrium: Right atrial size was normal in size. Pericardium: There is no evidence of pericardial effusion. Mitral Valve: The mitral valve is abnormal. Severe mitral annular calcification. Trivial mitral valve regurgitation. No evidence of mitral valve stenosis. Tricuspid Valve: The tricuspid valve is normal in structure. Tricuspid valve regurgitation is mild . No evidence of tricuspid stenosis. Aortic Valve: The aortic valve has an indeterminant number of cusps. There is moderate calcification of the aortic valve. Aortic valve regurgitation is  not visualized. No aortic stenosis is present. Pulmonic Valve: The pulmonic valve was not well visualized. Pulmonic valve regurgitation is not visualized. No evidence of pulmonic stenosis. Aorta: The aortic root is normal in size and structure. Venous: The inferior vena cava is dilated but collapsibility is not well visualized. IAS/Shunts: No atrial level shunt detected by color flow Doppler. Additional Comments: Moderate ascites is present.  LEFT VENTRICLE PLAX 2D LVIDd:         4.70 cm   Diastology LVIDs:         3.00 cm   LV e' medial:    5.11 cm/s LV PW:         1.20 cm   LV E/e' medial:  22.9 LV IVS:        1.00 cm   LV e' lateral:   8.92 cm/s LVOT diam:     1.90 cm   LV E/e' lateral: 13.1 LV SV:         82 LV SV Index:   40 LVOT Area:     2.84 cm  RIGHT VENTRICLE RV S prime:     13.30 cm/s TAPSE (M-mode): 2.3 cm LEFT ATRIUM           Index        RIGHT ATRIUM           Index LA diam:      3.70 cm 1.81 cm/m   RA Area:     13.90 cm LA Vol (A2C): 50.7 ml 24.85 ml/m  RA Volume:   31.80 ml  15.59 ml/m LA Vol (A4C): 82.4 ml 40.39 ml/m  AORTIC VALVE LVOT Vmax:   105.00 cm/s LVOT Vmean:  75.900 cm/s LVOT VTI:    0.288 m  AORTA Ao  Root diam: 3.30 cm MITRAL VALVE                TRICUSPID VALVE MV Area (PHT): 1.91 cm     TR Peak grad:   18.3 mmHg MV Decel Time: 398 msec     TR Vmax:        214.00 cm/s MV E velocity: 117.00 cm/s MV A velocity: 102.00 cm/s  SHUNTS MV E/A ratio:  1.15         Systemic VTI:  0.29 m                             Systemic Diam: 1.90 cm Vishnu Priya Mallipeddi Electronically signed by Winfield Rast Mallipeddi Signature Date/Time: 09/04/2022/11:32:03 AM    Final    DG Chest 2 View  Result Date: 09/03/2022 CLINICAL DATA:  Fluid overload. EXAM: CHEST - 2 VIEW COMPARISON:  08/17/2022. FINDINGS: Heart is normal in size and the mediastinal contour is within normal limits. There is atherosclerotic calcification of the aorta. Mild interstitial prominence is present bilaterally. No effusion or pneumothorax. Cervical spinal fusion hardware is noted no acute osseous abnormality. IMPRESSION: Mild interstitial prominence bilaterally, possible edema or infiltrate. Electronically Signed   By: Thornell Sartorius M.D.   On: 09/03/2022 20:20    Scheduled Meds:  feeding supplement (GLUCERNA SHAKE)  237 mL Oral TID BM   insulin aspart  0-5 Units Subcutaneous QHS   insulin aspart  0-9 Units Subcutaneous TID WC   levothyroxine  125 mcg Oral Q0600   midodrine  5 mg Oral TID WC   Continuous Infusions:  albumin human 25 g (09/04/22 1459)     LOS: 0 days  Time spent: 48 mins  Illias Pantano Laural Benes, MD How to contact the [facility] Attending or Consulting provider 7A - 7P or covering provider during after hours 7P -7A, for this patient?  Check the care team in [facility] and look for a) attending/consulting [facility] provider listed and b) the [facility] team listed Log into [email or web address] and use 's universal password to access. If you do not have the password, please contact the hospital operator. Locate the [facility] provider you are looking for under [facility] and page to a number that you can be directly reached. If you still have difficulty  reaching the provider, please page the [facility] (Director on Call) for the Hospitalists listed on amion for assistance.  09/04/2022, 4:20 PM

## 2022-09-05 DIAGNOSIS — R34 Anuria and oliguria: Secondary | ICD-10-CM

## 2022-09-05 DIAGNOSIS — R7989 Other specified abnormal findings of blood chemistry: Secondary | ICD-10-CM | POA: Diagnosis not present

## 2022-09-05 DIAGNOSIS — R9431 Abnormal electrocardiogram [ECG] [EKG]: Secondary | ICD-10-CM | POA: Diagnosis not present

## 2022-09-05 DIAGNOSIS — T83011A Breakdown (mechanical) of indwelling urethral catheter, initial encounter: Secondary | ICD-10-CM

## 2022-09-05 DIAGNOSIS — N189 Chronic kidney disease, unspecified: Secondary | ICD-10-CM

## 2022-09-05 DIAGNOSIS — D696 Thrombocytopenia, unspecified: Secondary | ICD-10-CM | POA: Diagnosis not present

## 2022-09-05 DIAGNOSIS — N179 Acute kidney failure, unspecified: Secondary | ICD-10-CM | POA: Diagnosis not present

## 2022-09-05 LAB — GLUCOSE, CAPILLARY
Glucose-Capillary: 120 mg/dL — ABNORMAL HIGH (ref 70–99)
Glucose-Capillary: 157 mg/dL — ABNORMAL HIGH (ref 70–99)
Glucose-Capillary: 122 mg/dL — ABNORMAL HIGH (ref 70–99)
Glucose-Capillary: 160 mg/dL — ABNORMAL HIGH (ref 70–99)

## 2022-09-05 LAB — CBC
HCT: 24.3 % — ABNORMAL LOW (ref 36.0–46.0)
Hemoglobin: 8.1 g/dL — ABNORMAL LOW (ref 12.0–15.0)
MCH: 33.1 pg (ref 26.0–34.0)
MCHC: 33.3 g/dL (ref 30.0–36.0)
MCV: 99.2 fL (ref 80.0–100.0)
Platelets: 30 10*3/uL — ABNORMAL LOW (ref 150–400)
RBC: 2.45 MIL/uL — ABNORMAL LOW (ref 3.87–5.11)
RDW: 15.9 % — ABNORMAL HIGH (ref 11.5–15.5)
WBC: 3.8 10*3/uL — ABNORMAL LOW (ref 4.0–10.5)
nRBC: 0 % (ref 0.0–0.2)

## 2022-09-05 LAB — URINE CULTURE

## 2022-09-05 LAB — COMPREHENSIVE METABOLIC PANEL
ALT: 17 U/L (ref 0–44)
AST: 25 U/L (ref 15–41)
Albumin: 2.9 g/dL — ABNORMAL LOW (ref 3.5–5.0)
Alkaline Phosphatase: 65 U/L (ref 38–126)
Anion gap: 8 (ref 5–15)
BUN: 64 mg/dL — ABNORMAL HIGH (ref 8–23)
CO2: 19 mmol/L — ABNORMAL LOW (ref 22–32)
Calcium: 7.9 mg/dL — ABNORMAL LOW (ref 8.9–10.3)
Chloride: 105 mmol/L (ref 98–111)
Creatinine, Ser: 3.92 mg/dL — ABNORMAL HIGH (ref 0.44–1.00)
GFR, Estimated: 12 mL/min — ABNORMAL LOW (ref >60)
Glucose, Bld: 122 mg/dL — ABNORMAL HIGH (ref 70–99)
Potassium: 3.7 mmol/L (ref 3.5–5.1)
Sodium: 132 mmol/L — ABNORMAL LOW (ref 135–145)
Total Bilirubin: 1.6 mg/dL — ABNORMAL HIGH (ref 0.3–1.2)
Total Protein: 4.9 g/dL — ABNORMAL LOW (ref 6.5–8.1)

## 2022-09-05 LAB — MAGNESIUM: Magnesium: 2.4 mg/dL (ref 1.7–2.4)

## 2022-09-05 NOTE — Progress Notes (Signed)
PROGRESS NOTE   [name]  FAO:[id number] DOB: 04-06-1950 DOA: 09/03/2022 PCP: Benita Stabile, MD   Chief Complaint  Patient presents with   foley catheter not draining   Level of care: Telemetry  Brief Admission History:   72 y.o. female with medical history significant of NASH liver cirrhosis, T2DM, essential hypertension, hypothyroidism, chronic kidney disease stage III who presents to the emergency department from [facility] in [place] due to lower extremity edema and non-draining Foley.  She was accompanied by son at bedside. Patient was recently seen by her urologist due to chronic vaginal/bladder prolapse and a pessary was placed, a small laceration was noted on the prolapsed bladder, this was repaired and a Foley catheter was placed to allow the bladder to rest while healing.  The Foley drained partially on that day, but patient returned to the urologist and the Foley was readjusted, unfortunately, it was still not draining well.  She was seen in the emergency department on 5/23 due to UTI whereby, IV Rocephin was given in the ED.  Foley catheter was changed due to urine draining around the catheter.  Apparently, patient has been prescribed with Bactrim by her gynecologist, she was advised to continue with the medication, she was discharged home from the ED.  She complained of increased leg swelling and increased abdominal girth which she thought was due to increased fluid due to ascites/cirrhosis.  Patient also complained of shortness of breath within the last 2 days.  She went to see her endocrinologist today and she was sent to the ED for diuresis and Foley catheter replacement.  Patient was recently admitted from 5/28 to 5/31 due to acute kidney injury superimposed on chronic kidney disease   ED Course:  In the emergency department, HR was 55, BP 111/56 and other vital signs are within normal range.  Workup in the ED showed WBC 7.1, hemoglobin 10.2, hematocrit 30.6, MCV 98.1, platelets 54  BMP showed sodium 131, potassium 3.8, chloride 104, bicarb 19, glucose 143, BUN 61, creatinine 3.87, albumin 2.3, BNP 274, magnesium 2.4 She was treated with IV Lasix 40 mg x 1.  Hospitalist was asked to admit patient for further evaluation and management.   Assessment and Plan:  Acute kidney injury superimposed on chronic kidney disease stage 3b  Pt does not seem to be making much urine and on CT scan bladder empty Nephrologist consulted and making recommendations Renally adjust medications, avoid nephrotoxic agents/dehydration/hypotension Moderate ascites noted on CT scan, discussed with Dr. Ronne Binning will proceed with paracentesis if possible Nephrology requested to limit to 3-4L and give with albumin  Oliguria  -pt producing about 250 cc urine per day -continue to follow closely -appreciate nephrology consult and recommendations  Prolapsed Bladder with pessary in place -urologist Dr. Ronne Binning consulted and will see    Elevated BNP, acute heart failure ruled out BNP 274, this may be due to declining renal function Chest x-ray showed mild interstitial prominence bilaterally, possible edema or infiltrate IV Lasix 40 mg x 1 was given in the ED Nephrologist consulted due to worsening kidney status Continue total input/output, daily weights and fluid restriction Continue heart healthy/carb modified diet       Echocardiogram reassuring, LVEF 60-65% no RWMAs  Hypoalbuminemia possibly due to moderate protein calorie malnutrition Albumin 2.3, protein supplement will be provided   Thrombocytopenia possibly due to NASH cirrhosis of the liver Platelets 54, continue to monitor platelet levels   Prolonged QT interval (chronic) QTc 501 ms Avoid QT prolonging  drugs Magnesium level 2.4 Repeat EKG was not done, will request again   Hypertension with renal disease BP meds will be held at this time due to soft BP   Type 2 diabetes mellitus with diabetic chronic kidney disease Hemoglobin  A1c was 6.0 on 08/17/2022 Continue ISS and hypoglycemic protocol  CBG (last 3)  Recent Labs    09/04/22 2116 09/05/22 0707 09/05/22 1124  GLUCAP 159* 120* 157*   Acquired hypothyroidism Continue Synthroid   DVT prophylaxis: SCDs Code Status: Full  Family Communication: sister at bedside 6/7  Disposition: Status is: Inpatient Remains inpatient appropriate because: IV treatments required, urology, nephrology consultation   Consultants:   Procedures:   Antimicrobials:    Subjective: Pt reports that her urine output has increased from yesterday, tolerating the IV albumin treatments.    Objective: Vitals:   09/04/22 1959 09/05/22 0432 09/05/22 0433 09/05/22 0930  BP: (!) 121/53 (!) 107/44  (!) 107/40  Pulse: 60 62  62  Resp: 18 16    Temp: 98.1 F (36.7 C) 98.4 F (36.9 C)    TempSrc: Oral Oral    SpO2: 100% 100%  100%  Weight:   98 kg   Height:        Intake/Output Summary (Last 24 hours) at 09/05/2022 1114 Last data filed at 09/05/2022 0802 Gross per 24 hour  Intake 451.37 ml  Output 250 ml  Net 201.37 ml   Filed Weights   09/03/22 2054 09/04/22 0604 09/05/22 0433  Weight: 94.8 kg 95.1 kg 98 kg   Examination:  General exam: Appears volume overloaded and chronically ill, sitting up on side of bed, calm and comfortable, NAD.  Respiratory system: no increased work of breathing.   Cardiovascular system: normal S1 & S2 heard. No JVD, murmurs, rubs, gallops or clicks. 2+ pedal edema. Gastrointestinal system: Abdomen is distended with ascites, soft and nontender. No organomegaly or masses felt. Normal bowel sounds heard. Central nervous system: Alert and oriented. No focal neurological deficits. Extremities: 2+ edema BLEs.  Skin: No rashes, lesions or ulcers. Psychiatry: Judgement and insight appear normal. Mood & affect appropriate.   Data Reviewed: I have personally reviewed following labs and imaging studies  CBC: Recent Labs  Lab 09/03/22 1625 09/04/22 0452  09/05/22 0421  WBC 7.1 4.3 3.8*  NEUTROABS 4.9  --   --   HGB 10.2* 9.2* 8.1*  HCT 30.6* 27.2* 24.3*  MCV 98.1 98.2 99.2  PLT 54* 34* 30*    Basic Metabolic Panel: Recent Labs  Lab 09/03/22 1625 09/04/22 0452 09/05/22 0421  NA 131* 132* 132*  K 3.8 3.7 3.7  CL 104 104 105  CO2 19* 18* 19*  GLUCOSE 143* 111* 122*  BUN 61* 65* 64*  CREATININE 3.87* 3.92* 3.92*  CALCIUM 7.8* 7.7* 7.9*  MG 2.4 2.4 2.4  PHOS  --  5.0*  --     CBG: Recent Labs  Lab 09/04/22 0725 09/04/22 1131 09/04/22 1621 09/04/22 2116 09/05/22 0707  GLUCAP 102* 131* 172* 159* 120*    Recent Results (from the past 240 hour(s))  Urine Culture     Status: None (Preliminary result)   Collection Time: 09/04/22  4:22 AM   Specimen: Urine, Random  Result Value Ref Range Status   Specimen Description   Final    URINE, RANDOM Performed at [facility], [address] [place], [place] [address]    Special Requests   Final    NONE Reflexed from [id number] Performed at [facility], [address] [place], [place] [address]    Culture   Final    CULTURE REINCUBATED FOR BETTER GROWTH Performed at [facility], [address], [place], [place] [address]    Report Status PENDING  Incomplete     Radiology Studies: CT RENAL STONE STUDY  Result Date: 09/04/2022 CLINICAL DATA:  Bladder neck obstruction, foreign body. EXAM: CT ABDOMEN AND PELVIS WITHOUT CONTRAST TECHNIQUE: Multidetector CT imaging of the abdomen and pelvis was performed following the standard protocol without IV contrast. RADIATION DOSE REDUCTION: This exam was performed according to the departmental dose-optimization program which includes automated exposure control, adjustment of the mA and/or kV according to patient size and/or use of iterative reconstruction technique. COMPARISON:  Aug 25, 2022. FINDINGS: Lower chest: No acute abnormality. Hepatobiliary: Nodular hepatic contours are noted consistent with hepatic cirrhosis. Status post  cholecystectomy. No biliary dilatation is noted. Pancreas: Diffuse pancreatic atrophy is noted. Spleen: Mild splenomegaly. Adrenals/Urinary Tract: Adrenal glands are unremarkable. Kidneys are normal, without renal calculi, focal lesion, or hydronephrosis. Bladder is unremarkable. Stomach/Bowel: Stomach is unremarkable. There is no evidence of bowel obstruction or inflammation. Vascular/Lymphatic: Aortic atherosclerosis. No enlarged abdominal or pelvic lymph nodes. Left upper quadrant varices are noted consistent with portal hypertension. Reproductive: Pessary is again noted. Status post hysterectomy. No adnexal abnormality. Other: Mild to moderate ascites is noted. No definite hernia is noted. Musculoskeletal: Stable old L4 compression fracture is noted. No acute osseous abnormality is noted. IMPRESSION: Hepatic cirrhosis with mild splenomegaly and ascites. Enlarged varices are noted in left upper quadrant consistent with portal hypertension. Aortic Atherosclerosis (ICD10-I70.0). Electronically Signed   By: Lupita Raider M.D.   On: 09/04/2022 13:08   ECHOCARDIOGRAM COMPLETE  Result Date: 09/04/2022    ECHOCARDIOGRAM REPORT   Patient Name:   ADORIA KLAUSS Date of Exam: 09/04/2022 Medical Rec #:  [id number]     Height:       66.0 in Accession #:    [id number]    Weight:       209.7 lb Date of Birth:  [birth year]     BSA:          2.040 m Patient Age:    71 years      BP:           96/44 mmHg Patient Gender: F             HR:           58 bpm. [facility] Procedure: 2D Echo, Cardiac Doppler and Color Doppler Indications:    CHF-Acute Diastolic I50.31  History:        Patient has prior history of Echocardiogram examinations, most                 recent 02/12/2010. Risk Factors:Hypertension, Diabetes and                 Dyslipidemia.  Sonographer:    Celesta Gentile RCS Referring Phys: [id number] OLADAPO ADEFESO IMPRESSIONS  1. Left ventricular ejection fraction, by estimation, is 60 to 65%. The left ventricle  has normal function. The left ventricle has no regional wall motion abnormalities. There is mild left ventricular hypertrophy. Left ventricular diastolic parameters are indeterminate.  2. Right ventricular systolic function is normal. The right ventricular size is normal.  3. Left atrial size was mild to moderately dilated.  4. The mitral valve is abnormal. Trivial mitral valve regurgitation. No evidence of mitral stenosis. Severe mitral annular calcification.  5. The aortic valve has an indeterminant number of cusps. There is moderate calcification of the aortic valve. Aortic valve regurgitation is not visualized. No aortic stenosis is present.  6. The inferior vena cava is dilated but collapsibility is not well visualized. Comparison(s): No prior Echocardiogram. FINDINGS  Left Ventricle: Left ventricular ejection fraction, by estimation, is 60 to 65%. The left ventricle has normal function. The left ventricle has no regional wall motion abnormalities. The left ventricular internal cavity size was normal in size. There is  mild left ventricular hypertrophy. Left ventricular diastolic parameters are indeterminate. Right Ventricle: The right ventricular size is normal. No increase in right ventricular wall thickness. Right ventricular systolic function is normal. Left Atrium: Left atrial size was mild to moderately dilated. Right Atrium: Right atrial size was normal in size. Pericardium: There is no evidence of pericardial effusion. Mitral Valve: The mitral valve is abnormal. Severe mitral annular calcification. Trivial mitral valve regurgitation. No evidence of mitral valve stenosis. Tricuspid Valve: The tricuspid valve is normal in structure. Tricuspid valve regurgitation is mild . No evidence of tricuspid stenosis. Aortic Valve: The aortic valve has an indeterminant number of cusps. There is moderate calcification of the aortic valve. Aortic valve regurgitation is not visualized. No aortic stenosis is present.  Pulmonic Valve: The pulmonic valve was not well visualized. Pulmonic valve regurgitation is not visualized. No evidence of pulmonic stenosis. Aorta: The aortic root is normal in size and structure. Venous: The inferior vena cava is dilated but collapsibility is not well visualized. IAS/Shunts: No atrial level shunt detected by color flow Doppler. Additional Comments: Moderate ascites is present.  LEFT VENTRICLE PLAX 2D LVIDd:         4.70 cm   Diastology LVIDs:         3.00 cm   LV e' medial:    5.11 cm/s LV PW:         1.20 cm   LV E/e' medial:  22.9 LV IVS:        1.00 cm   LV e' lateral:   8.92 cm/s LVOT diam:     1.90 cm   LV E/e' lateral: 13.1 LV SV:         82 LV SV Index:   40 LVOT Area:     2.84 cm  RIGHT VENTRICLE RV S prime:     13.30 cm/s TAPSE (M-mode): 2.3 cm LEFT ATRIUM           Index        RIGHT ATRIUM           Index LA diam:      3.70 cm 1.81 cm/m   RA Area:     13.90 cm LA Vol (A2C): 50.7 ml 24.85 ml/m  RA Volume:   31.80 ml  15.59 ml/m LA Vol (A4C): 82.4 ml 40.39 ml/m  AORTIC VALVE LVOT Vmax:   105.00 cm/s LVOT Vmean:  75.900 cm/s LVOT VTI:    0.288 m  AORTA Ao Root diam: 3.30 cm MITRAL VALVE                TRICUSPID VALVE MV Area (PHT): 1.91 cm     TR Peak grad:   18.3 mmHg MV Decel Time: 398 msec     TR Vmax:        214.00 cm/s MV E velocity: 117.00 cm/s MV A velocity: 102.00 cm/s  SHUNTS MV E/A ratio:  1.15         Systemic VTI:  0.29 m                             Systemic Diam: 1.90 cm Vishnu Priya Mallipeddi Electronically signed by Winfield Rast Mallipeddi Signature Date/Time: 09/04/2022/11:32:03 AM    Final    DG Chest 2 View  Result Date: 09/03/2022 CLINICAL DATA:  Fluid overload. EXAM: CHEST - 2 VIEW COMPARISON:  08/17/2022. FINDINGS: Heart is normal in size and the mediastinal contour is within normal limits. There is atherosclerotic calcification of the aorta. Mild interstitial prominence is present bilaterally. No effusion or pneumothorax. Cervical spinal fusion hardware is  noted no acute osseous abnormality. IMPRESSION: Mild interstitial prominence bilaterally, possible edema or infiltrate. Electronically Signed   By: Thornell Sartorius M.D.   On: 09/03/2022 20:20    Scheduled Meds:  feeding supplement (GLUCERNA SHAKE)  237 mL Oral TID BM   insulin aspart  0-5 Units Subcutaneous QHS   insulin aspart  0-9 Units Subcutaneous TID WC   levothyroxine  125 mcg Oral Q0600   midodrine  5 mg Oral TID WC   Continuous Infusions:   LOS: 1 day   Time spent: 38 mins  Aislyn Hayse Laural Benes, MD How to contact the [facility] Attending or Consulting provider 7A - 7P or covering provider during after hours 7P -7A, for this patient?  Check the care team in [facility] and look for a) attending/consulting [facility] provider listed and b) the [facility] team listed Log into [email or web address] and use [facility] universal password to access. If you do not have the password, please contact the hospital operator. Locate the [facility] provider you are looking for under [facility] and page to a number that you can be directly reached. If you still have difficulty reaching the provider, please page the [facility] (Director on Call) for the Hospitalists listed on amion for assistance.  09/05/2022, 11:14 AM

## 2022-09-05 NOTE — Consult Note (Signed)
Urology Consult  Referring physician: Dr. Laural Benes Reason for referral: Foley not draining properly  Chief Complaint: urinary incontinence  History of Present Illness: [name] is a 71yo with a history of DMII, CKD3, and cirrhosis who was admitted for worsening lower extremity edema and a poorly draining foley. She underwent repair of a vaginal ulcer/bladder injury from a pessary 2.5 weeks ago. She had a foley placed at that time which initially drained well but then stopped draining. It was replaced 3 times in the past 2 weeks due to concern for a nondraining foley. Per the patient she is having less urine output than normal. She denies any urinary urgency, urinary frequency or dysuria. She was having incontinence around the foley when she changed positions. He creatinine had increased to 3.92 from a baseline of 1.5. She is making 200-300cc of urine daily. CT stone study was performed which showed no hydronephrosis and a decompressed bladder. She was not to have a moderate amount of ascites on CT.   Past Medical History:  Diagnosis Date   Alkaline phosphatase elevation    Anemia    ASD (atrial septal defect)    Cataract    both eyes hx of   Chest pain    03-17-2013 last chest pain   Chronic kidney disease    Stage III kidney disease   Cirrhosis (HCC)    Depression    Diabetes mellitus type II    Family history of adverse reaction to anesthesia    Son hard to wake up   Fatigue    GERD (gastroesophageal reflux disease)    Gout    Headache(784.0)    occasional   Hyperlipidemia    Hypertension    Hypothyroidism    Neuropathy    Osteopenia    Peripheral neuropathy    Presence of pessary    Retinopathy    Past Surgical History:  Procedure Laterality Date   APPENDECTOMY     CATARACT EXTRACTION     CERVICAL SPINE SURGERY  2006   CHOLECYSTECTOMY N/A 04/07/2013   Procedure: LAPAROSCOPIC CHOLECYSTECTOMY WITH INTRAOPERATIVE CHOLANGIOGRAM;  Surgeon: Robyne Askew, MD;  Location: [facility];  Service: General;  Laterality: N/A;   COMBINED HYSTERECTOMY VAGINAL / OOPHORECTOMY / A&P REPAIR  1987   Unilateral oophorectomy, h/o uterine prolapse has right ovary   ERCP N/A 04/06/2013   Procedure: ENDOSCOPIC RETROGRADE CHOLANGIOPANCREATOGRAPHY (ERCP);  Surgeon: Theda Belfast, MD;  Location: [facility];  Service: Endoscopy;  Laterality: N/A;   ESOPHAGEAL BANDING N/A 09/26/2021   Procedure: ESOPHAGEAL BANDING;  Surgeon: Jeani Hawking, MD;  Location: [facility];  Service: Gastroenterology;  Laterality: N/A;   ESOPHAGEAL BANDING N/A 10/31/2021   Procedure: ESOPHAGEAL BANDING;  Surgeon: Jeani Hawking, MD;  Location: [facility];  Service: Gastroenterology;  Laterality: N/A;   ESOPHAGOGASTRODUODENOSCOPY (EGD) WITH PROPOFOL N/A 08/16/2020   Procedure: ESOPHAGOGASTRODUODENOSCOPY (EGD) WITH PROPOFOL;  Surgeon: Jeani Hawking, MD;  Location: [facility];  Service: Endoscopy;  Laterality: N/A;   ESOPHAGOGASTRODUODENOSCOPY (EGD) WITH PROPOFOL N/A 09/26/2021   Procedure: ESOPHAGOGASTRODUODENOSCOPY (EGD) WITH PROPOFOL;  Surgeon: Jeani Hawking, MD;  Location: [facility];  Service: Gastroenterology;  Laterality: N/A;   ESOPHAGOGASTRODUODENOSCOPY (EGD) WITH PROPOFOL N/A 10/31/2021   Procedure: ESOPHAGOGASTRODUODENOSCOPY (EGD) WITH PROPOFOL;  Surgeon: Jeani Hawking, MD;  Location: [facility];  Service: Gastroenterology;  Laterality: N/A;   EUS N/A 03/31/2013   Procedure: UPPER ENDOSCOPIC ULTRASOUND (EUS) LINEAR;  Surgeon: Theda Belfast, MD;  Location: [facility];  Service: Endoscopy;  Laterality: N/A;  REFRACTIVE SURGERY     SPINAL FUSION     c4-c7   TONSILLECTOMY  age 89    Medications: I have reviewed the patient's current medications. Allergies:  Allergies  Allergen Reactions   Bactrim [Sulfamethoxazole-Trimethoprim] Nausea And Vomiting and Nausea Only   Ciprofloxacin      severe yeast infection   Crestor [Rosuvastatin]     arthralgia/myalgia   Lipitor [Atorvastatin]      arthralgia/myalgia   Lisinopril Cough   Losartan Potassium Cough   Lovastatin     arthralgia/myalgia   Statins Other (See Comments)    arthralgia/myalgia   Welchol [Colesevelam]     arthralgia/myalgia   Zetia [Ezetimibe]     arthralgia/myalgia   Dilaudid [Hydromorphone Hcl] Nausea And Vomiting    After one vomiting episode no further vomiting    Family History  Problem Relation Age of Onset   COPD Mother    Lung cancer Father    Diabetes Sister    Cataracts Sister    Insulin resistance Daughter    Insulin resistance Son    Social History:  reports that she has never smoked. She has never used smokeless tobacco. She reports that she does not drink alcohol and does not use drugs.  Review of Systems  Constitutional:  Positive for fatigue.  Gastrointestinal:  Positive for abdominal distention.  Genitourinary:  Positive for decreased urine volume.  All other systems reviewed and are negative.   Physical Exam:  Vital signs in last 24 hours: Temp:  [97.7 F (36.5 C)-98.4 F (36.9 C)] 98.4 F (36.9 C) (06/08 0432) Pulse Rate:  [53-62] 62 (06/08 0930) Resp:  [16-20] 16 (06/08 0432) BP: (96-121)/(40-53) 107/40 (06/08 0930) SpO2:  [100 %] 100 % (06/08 0930) Weight:  [98 kg] 98 kg (06/08 0433) Physical Exam Vitals reviewed.  Constitutional:      Appearance: Normal appearance.  HENT:     Head: Normocephalic and atraumatic.     Nose: Nose normal. No congestion.  Eyes:     Extraocular Movements: Extraocular movements intact.     Pupils: Pupils are equal, round, and reactive to light.  Cardiovascular:     Rate and Rhythm: Normal rate and regular rhythm.  Pulmonary:     Effort: Pulmonary effort is normal. No respiratory distress.  Abdominal:     General: Abdomen is flat. There is distension.  Musculoskeletal:        General: Swelling present. Normal range of motion.     Cervical back: Normal range of motion and neck supple.  Skin:    General: Skin is warm and dry.   Neurological:     General: No focal deficit present.     Mental Status: She is alert and oriented to person, place, and time.  Psychiatric:        Mood and Affect: Mood normal.        Behavior: Behavior normal.        Thought Content: Thought content normal.        Judgment: Judgment normal.     Laboratory Data:  Results for orders placed or performed during the hospital encounter of 09/03/22 (from the past 72 hour(s))  CBC with Differential     Status: Abnormal   Collection Time: 09/03/22  4:25 PM  Result Value Ref Range   WBC 7.1 4.0 - 10.5 K/uL   RBC 3.12 (L) 3.87 - 5.11 MIL/uL   Hemoglobin 10.2 (L) 12.0 - 15.0 g/dL   HCT 16.1 (L) 09.6 - 04.5 %  MCV 98.1 80.0 - 100.0 fL   MCH 32.7 26.0 - 34.0 pg   MCHC 33.3 30.0 - 36.0 g/dL   RDW 16.1 (H) 09.6 - 04.5 %   Platelets 54 (L) 150 - 400 K/uL    Comment: Immature Platelet Fraction may be clinically indicated, consider ordering this additional test [id number]    nRBC 0.0 0.0 - 0.2 %   Neutrophils Relative % 70 %   Neutro Abs 4.9 1.7 - 7.7 K/uL   Lymphocytes Relative 17 %   Lymphs Abs 1.2 0.7 - 4.0 K/uL   Monocytes Relative 7 %   Monocytes Absolute 0.5 0.1 - 1.0 K/uL   Eosinophils Relative 6 %   Eosinophils Absolute 0.5 0.0 - 0.5 K/uL   Basophils Relative 0 %   Basophils Absolute 0.0 0.0 - 0.1 K/uL   WBC Morphology MORPHOLOGY UNREMARKABLE    RBC Morphology MORPHOLOGY UNREMARKABLE    Smear Review PLATELET COUNT CONFIRMED BY SMEAR    Immature Granulocytes 0 %   Abs Immature Granulocytes 0.02 0.00 - 0.07 K/uL    Comment: Performed at [facility], [address] [place], [place] [address]  Comprehensive metabolic panel     Status: Abnormal   Collection Time: 09/03/22  4:25 PM  Result Value Ref Range   Sodium 131 (L) 135 - 145 mmol/L   Potassium 3.8 3.5 - 5.1 mmol/L   Chloride 104 98 - 111 mmol/L   CO2 19 (L) 22 - 32 mmol/L   Glucose, Bld 143 (H) 70 - 99 mg/dL    Comment: Glucose reference range applies only to samples  taken after fasting for at least 8 hours.   BUN 61 (H) 8 - 23 mg/dL   Creatinine, Ser 5.62 (H) 0.44 - 1.00 mg/dL   Calcium 7.8 (L) 8.9 - 10.3 mg/dL   Total Protein 5.1 (L) 6.5 - 8.1 g/dL   Albumin 2.3 (L) 3.5 - 5.0 g/dL   AST 34 15 - 41 U/L   ALT 24 0 - 44 U/L   Alkaline Phosphatase 89 38 - 126 U/L   Total Bilirubin 1.3 (H) 0.3 - 1.2 mg/dL   GFR, Estimated 12 (L) >60 mL/min    Comment: (NOTE) Calculated using the CKD-EPI Creatinine Equation (2021)    Anion gap 8 5 - 15    Comment: Performed at [facility], [address], [place], [place] [address]  Magnesium     Status: None   Collection Time: 09/03/22  4:25 PM  Result Value Ref Range   Magnesium 2.4 1.7 - 2.4 mg/dL    Comment: Performed at [facility], [address] [place], [place] [address]  Brain natriuretic peptide     Status: Abnormal   Collection Time: 09/03/22  4:25 PM  Result Value Ref Range   B Natriuretic Peptide 274.0 (H) 0.0 - 100.0 pg/mL    Comment: Performed at [facility], [address] [place], [place] [address]  Glucose, capillary     Status: Abnormal   Collection Time: 09/03/22  9:37 PM  Result Value Ref Range   Glucose-Capillary 113 (H) 70 - 99 mg/dL    Comment: Glucose reference range applies only to samples taken after fasting for at least 8 hours.  Urinalysis, w/ Reflex to Culture (Infection Suspected) -Urine, Clean Catch     Status: Abnormal   Collection Time: 09/04/22  4:22 AM  Result Value Ref Range   Specimen Source URINE, CLEAN CATCH    Color, Urine YELLOW YELLOW   APPearance  HAZY (A) CLEAR   Specific Gravity, Urine 1.008 1.005 - 1.030   pH 5.0 5.0 - 8.0   Glucose, UA NEGATIVE NEGATIVE mg/dL   Hgb urine dipstick MODERATE (A) NEGATIVE   Bilirubin Urine NEGATIVE NEGATIVE   Ketones, ur NEGATIVE NEGATIVE mg/dL   Protein, ur NEGATIVE NEGATIVE mg/dL   Nitrite NEGATIVE NEGATIVE   Leukocytes,Ua LARGE (A) NEGATIVE   RBC / HPF 21-50 0 - 5 RBC/hpf   WBC, UA 21-50 0 - 5 WBC/hpf    Comment:         Reflex urine culture not performed if WBC <=10, OR if Squamous epithelial cells >5. If Squamous epithelial cells >5 suggest recollection.    Bacteria, UA RARE (A) NONE SEEN   Squamous Epithelial / HPF 0-5 0 - 5 /HPF   Budding Yeast PRESENT    Hyaline Casts, UA PRESENT    Uric Acid Crys, UA PRESENT     Comment: Performed at [facility], [address], [place], [place] [address]  Urine Culture     Status: None (Preliminary result)   Collection Time: 09/04/22  4:22 AM   Specimen: Urine, Random  Result Value Ref Range   Specimen Description      URINE, RANDOM Performed at [facility], [address], [place], [place] [address]    Special Requests      NONE Reflexed from [id number] Performed at [facility], [address], [place], [place] [address]    Culture      CULTURE REINCUBATED FOR BETTER GROWTH Performed at [facility], [address], [place], [place] [address]    Report Status PENDING   Comprehensive metabolic panel     Status: Abnormal   Collection Time: 09/04/22  4:52 AM  Result Value Ref Range   Sodium 132 (L) 135 - 145 mmol/L   Potassium 3.7 3.5 - 5.1 mmol/L   Chloride 104 98 - 111 mmol/L   CO2 18 (L) 22 - 32 mmol/L   Glucose, Bld 111 (H) 70 - 99 mg/dL    Comment: Glucose reference range applies only to samples taken after fasting for at least 8 hours.   BUN 65 (H) 8 - 23 mg/dL   Creatinine, Ser 3.08 (H) 0.44 - 1.00 mg/dL   Calcium 7.7 (L) 8.9 - 10.3 mg/dL   Total Protein 4.6 (L) 6.5 - 8.1 g/dL   Albumin 2.1 (L) 3.5 - 5.0 g/dL   AST 30 15 - 41 U/L   ALT 20 0 - 44 U/L   Alkaline Phosphatase 79 38 - 126 U/L   Total Bilirubin 1.4 (H) 0.3 - 1.2 mg/dL   GFR, Estimated 12 (L) >60 mL/min    Comment: (NOTE) Calculated using the CKD-EPI Creatinine Equation (2021)    Anion gap 10 5 - 15    Comment: Performed at [facility], [address], [place], [place] [address]  CBC     Status: Abnormal   Collection Time: 09/04/22  4:52 AM  Result Value Ref Range   WBC  4.3 4.0 - 10.5 K/uL   RBC 2.77 (L) 3.87 - 5.11 MIL/uL   Hemoglobin 9.2 (L) 12.0 - 15.0 g/dL   HCT 69.6 (L) 29.5 - 28.4 %   MCV 98.2 80.0 - 100.0 fL   MCH 33.2 26.0 - 34.0 pg   MCHC 33.8 30.0 - 36.0 g/dL   RDW 13.2 (H) 44.0 - 10.2 %   Platelets 34 (L) 150 - 400 K/uL    Comment: SPECIMEN CHECKED  FOR CLOTS Immature Platelet Fraction may be clinically indicated, consider ordering this additional test [id number] CONSISTENT WITH PREVIOUS RESULT DELTA CHECK NOTED    nRBC 0.0 0.0 - 0.2 %    Comment: Performed at [facility], [address] [place], [place] [address]  Magnesium     Status: None   Collection Time: 09/04/22  4:52 AM  Result Value Ref Range   Magnesium 2.4 1.7 - 2.4 mg/dL    Comment: Performed at [facility], [address], [place], [place] [address]  Phosphorus     Status: Abnormal   Collection Time: 09/04/22  4:52 AM  Result Value Ref Range   Phosphorus 5.0 (H) 2.5 - 4.6 mg/dL    Comment: Performed at [facility], [address], [place], [place] [address]  Glucose, capillary     Status: Abnormal   Collection Time: 09/04/22  7:25 AM  Result Value Ref Range   Glucose-Capillary 102 (H) 70 - 99 mg/dL    Comment: Glucose reference range applies only to samples taken after fasting for at least 8 hours.  Glucose, capillary     Status: Abnormal   Collection Time: 09/04/22 11:31 AM  Result Value Ref Range   Glucose-Capillary 131 (H) 70 - 99 mg/dL    Comment: Glucose reference range applies only to samples taken after fasting for at least 8 hours.  Glucose, capillary     Status: Abnormal   Collection Time: 09/04/22  4:21 PM  Result Value Ref Range   Glucose-Capillary 172 (H) 70 - 99 mg/dL    Comment: Glucose reference range applies only to samples taken after fasting for at least 8 hours.   Comment 1 Notify RN    Comment 2 Document in Chart   Glucose, capillary     Status: Abnormal   Collection Time: 09/04/22  9:16 PM  Result Value Ref Range   Glucose-Capillary 159 (H)  70 - 99 mg/dL    Comment: Glucose reference range applies only to samples taken after fasting for at least 8 hours.  Comprehensive metabolic panel     Status: Abnormal   Collection Time: 09/05/22  4:21 AM  Result Value Ref Range   Sodium 132 (L) 135 - 145 mmol/L   Potassium 3.7 3.5 - 5.1 mmol/L   Chloride 105 98 - 111 mmol/L   CO2 19 (L) 22 - 32 mmol/L   Glucose, Bld 122 (H) 70 - 99 mg/dL    Comment: Glucose reference range applies only to samples taken after fasting for at least 8 hours.   BUN 64 (H) 8 - 23 mg/dL   Creatinine, Ser 8.46 (H) 0.44 - 1.00 mg/dL   Calcium 7.9 (L) 8.9 - 10.3 mg/dL   Total Protein 4.9 (L) 6.5 - 8.1 g/dL   Albumin 2.9 (L) 3.5 - 5.0 g/dL   AST 25 15 - 41 U/L   ALT 17 0 - 44 U/L   Alkaline Phosphatase 65 38 - 126 U/L   Total Bilirubin 1.6 (H) 0.3 - 1.2 mg/dL   GFR, Estimated 12 (L) >60 mL/min    Comment: (NOTE) Calculated using the CKD-EPI Creatinine Equation (2021)    Anion gap 8 5 - 15    Comment: Performed at [facility], [address], [place], [place] [address]  CBC     Status: Abnormal   Collection Time: 09/05/22  4:21 AM  Result Value Ref Range   WBC 3.8 (L) 4.0 - 10.5 K/uL   RBC 2.45 (L) 3.87 - 5.11  MIL/uL   Hemoglobin 8.1 (L) 12.0 - 15.0 g/dL   HCT 09.8 (L) 11.9 - 14.7 %   MCV 99.2 80.0 - 100.0 fL   MCH 33.1 26.0 - 34.0 pg   MCHC 33.3 30.0 - 36.0 g/dL   RDW 82.9 (H) 56.2 - 13.0 %   Platelets 30 (L) 150 - 400 K/uL    Comment: SPECIMEN CHECKED FOR CLOTS CONSISTENT WITH PREVIOUS RESULT    nRBC 0.0 0.0 - 0.2 %    Comment: Performed at [facility], [address] [place], [place] [address]  Magnesium     Status: None   Collection Time: 09/05/22  4:21 AM  Result Value Ref Range   Magnesium 2.4 1.7 - 2.4 mg/dL    Comment: Performed at [facility], [address], [place], [place] [address]  Glucose, capillary     Status: Abnormal   Collection Time: 09/05/22  7:07 AM  Result Value Ref Range   Glucose-Capillary 120 (H) 70 - 99 mg/dL     Comment: Glucose reference range applies only to samples taken after fasting for at least 8 hours.  Glucose, capillary     Status: Abnormal   Collection Time: 09/05/22 11:24 AM  Result Value Ref Range   Glucose-Capillary 157 (H) 70 - 99 mg/dL    Comment: Glucose reference range applies only to samples taken after fasting for at least 8 hours.   Recent Results (from the past 240 hour(s))  Urine Culture     Status: None (Preliminary result)   Collection Time: 09/04/22  4:22 AM   Specimen: Urine, Random  Result Value Ref Range Status   Specimen Description   Final    URINE, RANDOM Performed at [facility], [address], [place], [place] [address]    Special Requests   Final    NONE Reflexed from [id number] Performed at [facility], [address], [place], [place] [address]    Culture   Final    CULTURE REINCUBATED FOR BETTER GROWTH Performed at [facility], [address], [place], [place] [address]    Report Status PENDING  Incomplete   Creatinine: Recent Labs    09/03/22 1625 09/04/22 0452 09/05/22 0421  CREATININE 3.87* 3.92* 3.92*   Baseline Creatinine: 1.5  Impression/Assessment:  71yo with acute renal failure, oliguric  Plan:  I discussed the natural history of renal failure with the patient and the various causes of renal failure. The patient does not have ureteral/outlet obstruction as cause for her renal failure. Agree with recommendation for albumin from nephrology. The patient would also benefit from paracentesis. Please call with an additional questions/concerns [name] 09/05/2022, 12:36 PM

## 2022-09-05 NOTE — TOC Progression Note (Signed)
Transition of Care [facility]) - Progression Note    Patient Details  Name: [name] MRN: [id number] Date of Birth: [birth year]  [facility] CM/SW Contact  Catalina [place], [place] Phone Number: 09/05/2022, 3:57 PM  Clinical Narrative:    Pt from [facility] rehab, plan to return there at DC.  CSW secure chat to MD as not PT recommendations/findings not documented yet.Shared with MD pt will need PT eval/order once ready to return.  DC Mon or Tues. TOC to follow.    Expected Discharge Plan: Skilled Nursing Facility Barriers to Discharge: Continued Medical Work up  Expected Discharge Plan and Services In-house Referral: Clinical Social Work Discharge Planning Services: CM Consult Post Acute Care Choice: Skilled Nursing Facility Living arrangements for the past 2 months: Single Family Home                                       Social Determinants of Health (SDOH) Interventions SDOH Screenings   Food Insecurity: No Food Insecurity (09/03/2022)  Housing: Low Risk  (09/03/2022)  Transportation Needs: No Transportation Needs (09/03/2022)  Utilities: Not At Risk (09/03/2022)  Alcohol Screen: Low Risk  (10/30/2019)  Depression (PHQ2-9): Medium Risk (10/30/2019)  Financial Resource Strain: Medium Risk (10/30/2019)  Physical Activity: Inactive (10/30/2019)  Social Connections: Moderately Integrated (10/30/2019)  Stress: No Stress Concern Present (10/30/2019)  Tobacco Use: Low Risk  (09/03/2022)    Readmission Risk Interventions    09/04/2022   12:38 PM  Readmission Risk Prevention Plan  Transportation Screening Complete  HRI or Home Care Consult Complete  Social Work Consult for Recovery Care Planning/Counseling Complete  Palliative Care Screening Complete  Medication Review Oceanographer) Complete

## 2022-09-06 DIAGNOSIS — I129 Hypertensive chronic kidney disease with stage 1 through stage 4 chronic kidney disease, or unspecified chronic kidney disease: Secondary | ICD-10-CM

## 2022-09-06 DIAGNOSIS — N179 Acute kidney failure, unspecified: Secondary | ICD-10-CM | POA: Diagnosis not present

## 2022-09-06 DIAGNOSIS — E8809 Other disorders of plasma-protein metabolism, not elsewhere classified: Secondary | ICD-10-CM | POA: Diagnosis not present

## 2022-09-06 DIAGNOSIS — N189 Chronic kidney disease, unspecified: Secondary | ICD-10-CM | POA: Diagnosis not present

## 2022-09-06 LAB — RENAL FUNCTION PANEL
Albumin: 2.6 g/dL — ABNORMAL LOW (ref 3.5–5.0)
Anion gap: 9 (ref 5–15)
BUN: 65 mg/dL — ABNORMAL HIGH (ref 8–23)
CO2: 18 mmol/L — ABNORMAL LOW (ref 22–32)
Calcium: 7.8 mg/dL — ABNORMAL LOW (ref 8.9–10.3)
Chloride: 104 mmol/L (ref 98–111)
Creatinine, Ser: 3.95 mg/dL — ABNORMAL HIGH (ref 0.44–1.00)
GFR, Estimated: 12 mL/min — ABNORMAL LOW (ref >60)
Glucose, Bld: 118 mg/dL — ABNORMAL HIGH (ref 70–99)
Phosphorus: 5 mg/dL — ABNORMAL HIGH (ref 2.5–4.6)
Potassium: 3.9 mmol/L (ref 3.5–5.1)
Sodium: 131 mmol/L — ABNORMAL LOW (ref 135–145)

## 2022-09-06 LAB — CBC
HCT: 24.8 % — ABNORMAL LOW (ref 36.0–46.0)
Hemoglobin: 8.2 g/dL — ABNORMAL LOW (ref 12.0–15.0)
MCH: 32.9 pg (ref 26.0–34.0)
MCHC: 33.1 g/dL (ref 30.0–36.0)
MCV: 99.6 fL (ref 80.0–100.0)
Platelets: 34 10*3/uL — ABNORMAL LOW (ref 150–400)
RBC: 2.49 MIL/uL — ABNORMAL LOW (ref 3.87–5.11)
RDW: 15.9 % — ABNORMAL HIGH (ref 11.5–15.5)
WBC: 4.2 10*3/uL (ref 4.0–10.5)
nRBC: 0 % (ref 0.0–0.2)

## 2022-09-06 LAB — NA AND K (SODIUM & POTASSIUM), RAND UR
Potassium Urine: 30 mmol/L
Sodium, Ur: <10 mmol/L

## 2022-09-06 LAB — GLUCOSE, CAPILLARY
Glucose-Capillary: 110 mg/dL — ABNORMAL HIGH (ref 70–99)
Glucose-Capillary: 161 mg/dL — ABNORMAL HIGH (ref 70–99)
Glucose-Capillary: 135 mg/dL — ABNORMAL HIGH (ref 70–99)
Glucose-Capillary: 178 mg/dL — ABNORMAL HIGH (ref 70–99)

## 2022-09-06 LAB — CREATININE, URINE, RANDOM: Creatinine, Urine: 180 mg/dL

## 2022-09-06 LAB — URINE CULTURE

## 2022-09-06 MED ORDER — NYSTATIN 100000 UNIT/ML MT SUSP
5.0000 mL | Freq: Four times a day (QID) | OROMUCOSAL | Status: AC
  Administered 2022-09-06 – 2022-09-09 (×9): 500000 [IU] via ORAL
  Filled 2022-09-06 (×8): qty 5

## 2022-09-06 MED ORDER — ALBUMIN HUMAN 25 % IV SOLN
25.0000 g | Freq: Once | INTRAVENOUS | Status: AC
  Administered 2022-09-07: 25 g via INTRAVENOUS
  Filled 2022-09-06: qty 100

## 2022-09-06 NOTE — Progress Notes (Signed)
PROGRESS NOTE   [name]  AOZ:[id number] DOB: April 18, 1950 DOA: 09/03/2022 PCP: Benita Stabile, MD   Chief Complaint  Patient presents with   foley catheter not draining   Level of care: Telemetry  Brief Admission History:   72 y.o. female with medical history significant of NASH liver cirrhosis, T2DM, essential hypertension, hypothyroidism, chronic kidney disease stage III who presents to the emergency department from [facility] in [place] due to lower extremity edema and non-draining Foley.  She was accompanied by son at bedside. Patient was recently seen by her urologist due to chronic vaginal/bladder prolapse and a pessary was placed, a small laceration was noted on the prolapsed bladder, this was repaired and a Foley catheter was placed to allow the bladder to rest while healing.  The Foley drained partially on that day, but patient returned to the urologist and the Foley was readjusted, unfortunately, it was still not draining well.  She was seen in the emergency department on 5/23 due to UTI whereby, IV Rocephin was given in the ED.  Foley catheter was changed due to urine draining around the catheter.  Apparently, patient has been prescribed with Bactrim by her gynecologist, she was advised to continue with the medication, she was discharged home from the ED.  She complained of increased leg swelling and increased abdominal girth which she thought was due to increased fluid due to ascites/cirrhosis.  Patient also complained of shortness of breath within the last 2 days.  She went to see her endocrinologist today and she was sent to the ED for diuresis and Foley catheter replacement.  Patient was recently admitted from 5/28 to 5/31 due to acute kidney injury superimposed on chronic kidney disease   ED Course:  In the emergency department, HR was 55, BP 111/56 and other vital signs are within normal range.  Workup in the ED showed WBC 7.1, hemoglobin 10.2, hematocrit 30.6, MCV 98.1, platelets 54  BMP showed sodium 131, potassium 3.8, chloride 104, bicarb 19, glucose 143, BUN 61, creatinine 3.87, albumin 2.3, BNP 274, magnesium 2.4 She was treated with IV Lasix 40 mg x 1.  Hospitalist was asked to admit patient for further evaluation and management.   Assessment and Plan:  Acute kidney injury superimposed on chronic kidney disease stage 3b rapidly progressing to stage IV  Pt remains oliguric and on CT scan bladder empty Nephrologist consulted and making recommendations Renally adjust medications, avoid nephrotoxic agents/dehydration/hypotension Moderate ascites noted on CT scan, discussed with Dr. Ronne Binning will proceed with paracentesis if possible Nephrology requested to limit to 3-4L and give with albumin US paracentesis planned for 6/10 with radiology, will be her first one  IV albumin ordered to be given after paracentesis   Oliguria  -pt barely producing about 250 cc urine per day -continue to follow closely -appreciate nephrology consult and recommendations -pt received multiple doses of IV albumin with some initial improvement in UOP  Prolapsed Bladder with pessary in place -urologist Dr. Ronne Binning consulted and seen and left note recommending proceed with paracentesis    Elevated BNP, acute heart failure ruled out BNP 274, this may be due to declining renal function Chest x-ray showed mild interstitial prominence bilaterally, possible edema or infiltrate IV Lasix 40 mg x 1 was given in the ED Nephrologist consulted due to worsening kidney status Continue total input/output, daily weights and fluid restriction Continue heart healthy/carb modified diet       Echocardiogram reassuring, LVEF 60-65% no RWMAs  Hypoalbuminemia possibly due to  moderate protein calorie malnutrition Albumin 2.3, protein supplement will be provided IV albumin ordered by nephrology   Thrombocytopenia possibly due to NASH cirrhosis of the liver Platelets 54, continue to monitor platelet levels    Prolonged QT interval (chronic) QTc 501 ms Avoid QT prolonging drugs Magnesium level 2.4 Repeat EKG was not done, will request again   Hypertension with renal disease BP meds will be held at this time due to soft BP   Type 2 diabetes mellitus with diabetic chronic kidney disease Hemoglobin A1c was 6.0 on 08/17/2022 Continue ISS and hypoglycemic protocol  CBG (last 3)  Recent Labs    09/05/22 2155 09/06/22 0706 09/06/22 1115  GLUCAP 160* 110* 161*   Acquired hypothyroidism Continue Synthroid   DVT prophylaxis: SCDs Code Status: Full  Family Communication: sister at bedside 6/7  Disposition: Status is: Inpatient Remains inpatient appropriate because: IV treatments required, urology, nephrology consultation   Consultants:   Procedures:   Antimicrobials:    Subjective: Pt remains oliguric but hopeful that she will not require dialysis, she is a little worried about paracentesis tomorrow, it would be her first    Objective: Vitals:   09/05/22 0930 09/05/22 1337 09/05/22 1925 09/06/22 0509  BP: (!) 107/40 (!) 129/41 (!) 115/48 (!) 118/48  Pulse: 62 (!) 58 (!) 57 (!) 57  Resp:  17 16 16   Temp:  98 F (36.7 C) 97.7 F (36.5 C) 98.4 F (36.9 C)  TempSrc:  Oral Oral Oral  SpO2: 100% 100% 97% 98%  Weight:    95.8 kg  Height:        Intake/Output Summary (Last 24 hours) at 09/06/2022 1208 Last data filed at 09/06/2022 0900 Gross per 24 hour  Intake 1080 ml  Output 300 ml  Net 780 ml   Filed Weights   09/04/22 0604 09/05/22 0433 09/06/22 0509  Weight: 95.1 kg 98 kg 95.8 kg   Examination:  General exam: Appears volume overloaded and chronically ill, sitting up in bed, calm and comfortable, NAD.  Respiratory system: no increased work of breathing.   Cardiovascular system: normal S1 & S2 heard. No JVD, murmurs, rubs, gallops or clicks. 2+ pedal edema. Gastrointestinal system: Abdomen is distended with ascites, soft and nontender. No organomegaly or masses felt.  Normal bowel sounds heard. Central nervous system: Alert and oriented. No focal neurological deficits. Extremities: 2+ edema BLEs.  Skin: No rashes, lesions or ulcers. Psychiatry: Judgement and insight appear normal. Mood & affect appropriate.   Data Reviewed: I have personally reviewed following labs and imaging studies  CBC: Recent Labs  Lab 09/03/22 1625 09/04/22 0452 09/05/22 0421 09/06/22 0403  WBC 7.1 4.3 3.8* 4.2  NEUTROABS 4.9  --   --   --   HGB 10.2* 9.2* 8.1* 8.2*  HCT 30.6* 27.2* 24.3* 24.8*  MCV 98.1 98.2 99.2 99.6  PLT 54* 34* 30* 34*    Basic Metabolic Panel: Recent Labs  Lab 09/03/22 1625 09/04/22 0452 09/05/22 0421 09/06/22 0403  NA 131* 132* 132* 131*  K 3.8 3.7 3.7 3.9  CL 104 104 105 104  CO2 19* 18* 19* 18*  GLUCOSE 143* 111* 122* 118*  BUN 61* 65* 64* 65*  CREATININE 3.87* 3.92* 3.92* 3.95*  CALCIUM 7.8* 7.7* 7.9* 7.8*  MG 2.4 2.4 2.4  --   PHOS  --  5.0*  --  5.0*    CBG: Recent Labs  Lab 09/05/22 1124 09/05/22 1614 09/05/22 2155 09/06/22 0706 09/06/22 1115  GLUCAP 157* 122* 160*  110* 161*    Recent Results (from the past 240 hour(s))  Urine Culture     Status: None (Preliminary result)   Collection Time: 09/04/22  4:22 AM   Specimen: Urine, Random  Result Value Ref Range Status   Specimen Description   Final    URINE, RANDOM Performed at [facility], [address] [place], [place] [address]    Special Requests   Final    NONE Reflexed from [id number] Performed at [facility], [address], [place], [place] [address]    Culture   Final    CULTURE REINCUBATED FOR BETTER GROWTH 20,000 COLONIES/mL ENTEROCOCCUS FAECALIS SUSCEPTIBILITIES TO FOLLOW 30,000 COLONIES/mL YEAST    Report Status PENDING  Incomplete     Radiology Studies: CT RENAL STONE STUDY  Result Date: 09/04/2022 CLINICAL DATA:  Bladder neck obstruction, foreign body. EXAM: CT ABDOMEN AND PELVIS WITHOUT CONTRAST TECHNIQUE: Multidetector CT imaging of the  abdomen and pelvis was performed following the standard protocol without IV contrast. RADIATION DOSE REDUCTION: This exam was performed according to the departmental dose-optimization program which includes automated exposure control, adjustment of the mA and/or kV according to patient size and/or use of iterative reconstruction technique. COMPARISON:  Aug 25, 2022. FINDINGS: Lower chest: No acute abnormality. Hepatobiliary: Nodular hepatic contours are noted consistent with hepatic cirrhosis. Status post cholecystectomy. No biliary dilatation is noted. Pancreas: Diffuse pancreatic atrophy is noted. Spleen: Mild splenomegaly. Adrenals/Urinary Tract: Adrenal glands are unremarkable. Kidneys are normal, without renal calculi, focal lesion, or hydronephrosis. Bladder is unremarkable. Stomach/Bowel: Stomach is unremarkable. There is no evidence of bowel obstruction or inflammation. Vascular/Lymphatic: Aortic atherosclerosis. No enlarged abdominal or pelvic lymph nodes. Left upper quadrant varices are noted consistent with portal hypertension. Reproductive: Pessary is again noted. Status post hysterectomy. No adnexal abnormality. Other: Mild to moderate ascites is noted. No definite hernia is noted. Musculoskeletal: Stable old L4 compression fracture is noted. No acute osseous abnormality is noted. IMPRESSION: Hepatic cirrhosis with mild splenomegaly and ascites. Enlarged varices are noted in left upper quadrant consistent with portal hypertension. Aortic Atherosclerosis (ICD10-I70.0). Electronically Signed   By: Lupita Raider M.D.   On: 09/04/2022 13:08    Scheduled Meds:  feeding supplement (GLUCERNA SHAKE)  237 mL Oral TID BM   insulin aspart  0-5 Units Subcutaneous QHS   insulin aspart  0-9 Units Subcutaneous TID WC   levothyroxine  125 mcg Oral Q0600   midodrine  5 mg Oral TID WC   Continuous Infusions:   LOS: 2 days   Time spent: 35 mins  Yajayra Feldt Laural Benes, MD How to contact the [facility] Attending or  Consulting provider 7A - 7P or covering provider during after hours 7P -7A, for this patient?  Check the care team in [facility] and look for a) attending/consulting [facility] provider listed and b) the [facility] team listed Log into [email or web address] and use [facility]'s universal password to access. If you do not have the password, please contact the hospital operator. Locate the [facility] provider you are looking for under [facility] and page to a number that you can be directly reached. If you still have difficulty reaching the provider, please page the [facility] (Director on Call) for the Hospitalists listed on amion for assistance.  09/06/2022, 12:08 PM

## 2022-09-06 NOTE — Progress Notes (Signed)
[facility] Progress Note   89F with bladder prolapse, cirrhosis, DM II, HTN, and CKD now with AKI on CKD.  Recently admitted to [facility] 5/28-5/31 for AKI in the setting of bactrim use and poor oral intake, prior to that she had a pessary but required a Foley catether at that time d/t inability to urinate.  Baseline Cr appears to be 1.5, was up to 2.8 on admission and was 2.5 by the time she was discharged. D/c to SNF where initially Foley was draining well but then was noted not to be and had to be readjusted.  States was taking her Lasix but doesn't look like it's on the home med list.     TTE with normal EF and no WMA, CT scan with ascites but no gross obstruction.   Foley has been removed.  Part of pessary still in place.  I/O cath attempted - PVR showed 450 but only 100 came out.  Urology has seen the pt in consultation as well.    Assessment/ Plan:    Aki on CKD 3b: recurrent AKI in the setting of multiple potential insults- intermittent obstruction, Bactrim use, soft BP, intravascular depletion.  CT scan without gross obstruction but likely intermittently obstructed.               - appreciate urology and gynecology expertise             - BP is low and with multiple insults need to increase effective arterial blood flow -> stopped nadolol, started midodrine, and given albumin q8. Treatment as with HRS. BP a little better and renal function is stable but not much improvement. Remains oliguirc -> will send urine studies today (U/A consistent with trauma or UTI).             - no indication for HD at present             - continue holding Lasix             - LVP on Monday -> would limit to 3-4 L and please give albumin as well to decrease risk of hypotension.   2.  NASH cirrhosis             - hold nadolol as above   3.  Bladder prolapse:             - urology consulted and have seen pt    Subjective:   Some nausea last night, sleepy even though she slept well last  night. Denies fever, chills but some dyspnea with exertion.   Objective:   BP (!) 118/48 (BP Location: Left Arm)   Pulse (!) 57   Temp 98.4 F (36.9 C) (Oral)   Resp 16   Ht 5\' 6"  (1.676 m)   Wt 95.8 kg   SpO2 98%   BMI 34.10 kg/m   Intake/Output Summary (Last 24 hours) at 09/06/2022 1209 Last data filed at 09/06/2022 0900 Gross per 24 hour  Intake 1080 ml  Output 300 ml  Net 780 ml   Weight change: -2.155 kg  Physical Exam: GEN NAD, supine in bed, easily arousable HEENT EOMI  NECK no JVD PULM clear CV RRR ABD soft, protuberant, + ascites EXT + 2+ LE edema NEURO AAO x 3 nonfocal SKIN no rashes/ lesions  Imaging: CT RENAL STONE STUDY  Result Date: 09/04/2022 CLINICAL DATA:  Bladder neck obstruction, foreign body. EXAM: CT ABDOMEN AND PELVIS WITHOUT CONTRAST TECHNIQUE: Multidetector CT imaging of the  abdomen and pelvis was performed following the standard protocol without IV contrast. RADIATION DOSE REDUCTION: This exam was performed according to the departmental dose-optimization program which includes automated exposure control, adjustment of the mA and/or kV according to patient size and/or use of iterative reconstruction technique. COMPARISON:  Aug 25, 2022. FINDINGS: Lower chest: No acute abnormality. Hepatobiliary: Nodular hepatic contours are noted consistent with hepatic cirrhosis. Status post cholecystectomy. No biliary dilatation is noted. Pancreas: Diffuse pancreatic atrophy is noted. Spleen: Mild splenomegaly. Adrenals/Urinary Tract: Adrenal glands are unremarkable. Kidneys are normal, without renal calculi, focal lesion, or hydronephrosis. Bladder is unremarkable. Stomach/Bowel: Stomach is unremarkable. There is no evidence of bowel obstruction or inflammation. Vascular/Lymphatic: Aortic atherosclerosis. No enlarged abdominal or pelvic lymph nodes. Left upper quadrant varices are noted consistent with portal hypertension. Reproductive: Pessary is again noted. Status post  hysterectomy. No adnexal abnormality. Other: Mild to moderate ascites is noted. No definite hernia is noted. Musculoskeletal: Stable old L4 compression fracture is noted. No acute osseous abnormality is noted. IMPRESSION: Hepatic cirrhosis with mild splenomegaly and ascites. Enlarged varices are noted in left upper quadrant consistent with portal hypertension. Aortic Atherosclerosis (ICD10-I70.0). Electronically Signed   By: Lupita Raider M.D.   On: 09/04/2022 13:08    Labs: BMET Recent Labs  Lab 09/03/22 1625 09/04/22 0452 09/05/22 0421 09/06/22 0403  NA 131* 132* 132* 131*  K 3.8 3.7 3.7 3.9  CL 104 104 105 104  CO2 19* 18* 19* 18*  GLUCOSE 143* 111* 122* 118*  BUN 61* 65* 64* 65*  CREATININE 3.87* 3.92* 3.92* 3.95*  CALCIUM 7.8* 7.7* 7.9* 7.8*  PHOS  --  5.0*  --  5.0*   CBC Recent Labs  Lab 09/03/22 1625 09/04/22 0452 09/05/22 0421 09/06/22 0403  WBC 7.1 4.3 3.8* 4.2  NEUTROABS 4.9  --   --   --   HGB 10.2* 9.2* 8.1* 8.2*  HCT 30.6* 27.2* 24.3* 24.8*  MCV 98.1 98.2 99.2 99.6  PLT 54* 34* 30* 34*    Medications:     feeding supplement (GLUCERNA SHAKE)  237 mL Oral TID BM   insulin aspart  0-5 Units Subcutaneous QHS   insulin aspart  0-9 Units Subcutaneous TID WC   levothyroxine  125 mcg Oral Q0600   midodrine  5 mg Oral TID WC      Paulene Floor, MD 09/06/2022, 12:09 PM

## 2022-09-06 NOTE — Plan of Care (Signed)
  Problem: Clinical Measurements: Goal: Ability to maintain clinical measurements within normal limits will improve Outcome: Progressing   

## 2022-09-07 ENCOUNTER — Inpatient Hospital Stay: Payer: Medicare Other | Attending: Internal Medicine

## 2022-09-07 ENCOUNTER — Inpatient Hospital Stay (HOSPITAL_COMMUNITY): Payer: Medicare Other

## 2022-09-07 ENCOUNTER — Encounter (HOSPITAL_COMMUNITY): Payer: Self-pay | Admitting: Family Medicine

## 2022-09-07 DIAGNOSIS — N179 Acute kidney failure, unspecified: Secondary | ICD-10-CM | POA: Diagnosis not present

## 2022-09-07 DIAGNOSIS — E8809 Other disorders of plasma-protein metabolism, not elsewhere classified: Secondary | ICD-10-CM | POA: Diagnosis not present

## 2022-09-07 DIAGNOSIS — N189 Chronic kidney disease, unspecified: Secondary | ICD-10-CM | POA: Diagnosis not present

## 2022-09-07 DIAGNOSIS — I129 Hypertensive chronic kidney disease with stage 1 through stage 4 chronic kidney disease, or unspecified chronic kidney disease: Secondary | ICD-10-CM | POA: Diagnosis not present

## 2022-09-07 LAB — GLUCOSE, CAPILLARY
Glucose-Capillary: 148 mg/dL — ABNORMAL HIGH (ref 70–99)
Glucose-Capillary: 136 mg/dL — ABNORMAL HIGH (ref 70–99)
Glucose-Capillary: 170 mg/dL — ABNORMAL HIGH (ref 70–99)
Glucose-Capillary: 204 mg/dL — ABNORMAL HIGH (ref 70–99)

## 2022-09-07 LAB — CBC
HCT: 27.9 % — ABNORMAL LOW (ref 36.0–46.0)
Hemoglobin: 9.6 g/dL — ABNORMAL LOW (ref 12.0–15.0)
MCH: 34.2 pg — ABNORMAL HIGH (ref 26.0–34.0)
MCHC: 34.4 g/dL (ref 30.0–36.0)
MCV: 99.3 fL (ref 80.0–100.0)
Platelets: 52 10*3/uL — ABNORMAL LOW (ref 150–400)
RBC: 2.81 MIL/uL — ABNORMAL LOW (ref 3.87–5.11)
RDW: 15.9 % — ABNORMAL HIGH (ref 11.5–15.5)
WBC: 6.6 10*3/uL (ref 4.0–10.5)
nRBC: 0 % (ref 0.0–0.2)

## 2022-09-07 LAB — RENAL FUNCTION PANEL
Albumin: 2.9 g/dL — ABNORMAL LOW (ref 3.5–5.0)
Anion gap: 7 (ref 5–15)
BUN: 68 mg/dL — ABNORMAL HIGH (ref 8–23)
CO2: 20 mmol/L — ABNORMAL LOW (ref 22–32)
Calcium: 8 mg/dL — ABNORMAL LOW (ref 8.9–10.3)
Chloride: 102 mmol/L (ref 98–111)
Creatinine, Ser: 4.09 mg/dL — ABNORMAL HIGH (ref 0.44–1.00)
GFR, Estimated: 11 mL/min — ABNORMAL LOW (ref >60)
Glucose, Bld: 149 mg/dL — ABNORMAL HIGH (ref 70–99)
Phosphorus: 5.1 mg/dL — ABNORMAL HIGH (ref 2.5–4.6)
Potassium: 4.1 mmol/L (ref 3.5–5.1)
Sodium: 129 mmol/L — ABNORMAL LOW (ref 135–145)

## 2022-09-07 LAB — GRAM STAIN

## 2022-09-07 LAB — BODY FLUID CELL COUNT WITH DIFFERENTIAL
Eos, Fluid: 0 %
Lymphs, Fluid: 49 %
Monocyte-Macrophage-Serous Fluid: 33 % — ABNORMAL LOW (ref 50–90)
Neutrophil Count, Fluid: 18 % (ref 0–25)
Total Nucleated Cell Count, Fluid: 104 cu mm (ref 0–1000)

## 2022-09-07 LAB — URINE CULTURE: Culture: 20000 — AB

## 2022-09-07 LAB — GLUCOSE, PLEURAL OR PERITONEAL FLUID: Glucose, Fluid: 155 mg/dL

## 2022-09-07 LAB — LACTATE DEHYDROGENASE, PLEURAL OR PERITONEAL FLUID: LD, Fluid: 39 U/L — ABNORMAL HIGH (ref 3–23)

## 2022-09-07 NOTE — Plan of Care (Signed)
  Problem: Acute Rehab PT Goals(only PT should resolve) Goal: Pt Will Go Supine/Side To Sit Outcome: Progressing Flowsheets (Taken 09/07/2022 1444) Pt will go Supine/Side to Sit: with supervision Goal: Patient Will Transfer Sit To/From Stand Outcome: Progressing Flowsheets (Taken 09/07/2022 1444) Patient will transfer sit to/from stand: with min guard assist Goal: Pt Will Transfer Bed To Chair/Chair To Bed Outcome: Progressing Flowsheets (Taken 09/07/2022 1444) Pt will Transfer Bed to Chair/Chair to Bed: min guard assist Goal: Pt Will Ambulate Outcome: Progressing Flowsheets (Taken 09/07/2022 1444) Pt will Ambulate:  75 feet  with min guard assist  with rolling walker   2:44 PM, 09/07/22 Ocie Bob, MPT Physical Therapist with [facility] [phone] office [phone] mobile phone

## 2022-09-07 NOTE — Progress Notes (Signed)
Patient tolerated left sided paracentesis procedure well today and 4 Liters of clear yellow ascites removed and sent to lab for processing. Patient verbalized understanding of post procedure instructions and transported back to inpatient bed assignment via stretcher at this time with no acute distress noted.

## 2022-09-07 NOTE — Evaluation (Signed)
Physical Therapy Evaluation Patient Details Name: [name] MRN: [id number] DOB: Apr 06, 1950 Today's Date: 09/07/2022  History of Present Illness  [name] is a 72 y.o. female with medical history significant of NASH liver cirrhosis, T2DM, essential hypertension, hypothyroidism, chronic kidney disease stage III who presents to the emergency department from [facility] in [place] due to lower extremity edema and non-draining Foley.  She was accompanied by son at bedside. Patient was recently seen by her urologist due to chronic vaginal/bladder prolapse and a pessary was placed, a small laceration was noted on the prolapsed bladder, this was repaired and a Foley catheter was placed to allow the bladder to rest while healing.  The Foley drained partially on that day, but patient returned to the urologist and the Foley was readjusted, unfortunately, it was still not draining well.  She was seen in the emergency department on 5/23 due to UTI whereby, IV Rocephin was given in the ED.  Foley catheter was changed due to urine draining around the catheter.  Apparently, patient has been prescribed with Bactrim by her gynecologist, she was advised to continue with the medication, she was discharged home from the ED.  She complained of increased leg swelling and increased abdominal girth which she thought was due to increased fluid due to ascites/cirrhosis.  Patient also complained of shortness of breath within the last 2 days  She went to see her endocrinologist today and she was sent to the ED for diuresis and Foley catheter replacement.  Patient was recently admitted from 5/28 to 5/31 due to acute kidney injury superimposed on chronic kidney disease   Clinical Impression  Patient has difficulty moving legs when getting back in bed due to weakness, demonstrates slightly labored cadence without loss of balance during gait training, but limited mostly due to fatigue and requested to go back to bed due to a lack of  sleep last night.  Patient will benefit from continued skilled physical therapy in hospital and recommended venue below to increase strength, balance, endurance for safe ADLs and gait.        Recommendations for follow up therapy are one component of a multi-disciplinary discharge planning process, led by the attending physician.  Recommendations may be updated based on patient status, additional functional criteria and insurance authorization.  Follow Up Recommendations Can patient physically be transported by private vehicle: Yes     Assistance Recommended at Discharge Set up Supervision/Assistance  Patient can return home with the following  A little help with walking and/or transfers;A little help with bathing/dressing/bathroom;Help with stairs or ramp for entrance;Assistance with cooking/housework    Equipment Recommendations None recommended by PT  Recommendations for Other Services       Functional Status Assessment Patient has had a recent decline in their functional status and demonstrates the ability to make significant improvements in function in a reasonable and predictable amount of time.     Precautions / Restrictions Precautions Precautions: Fall Restrictions Weight Bearing Restrictions: No      Mobility  Bed Mobility Overal bed mobility: Needs Assistance Bed Mobility: Supine to Sit, Sit to Supine     Supine to sit: Supervision, Min guard Sit to supine: Min assist   General bed mobility comments: required assistance to move legs onto bed due to weakness    Transfers Overall transfer level: Needs assistance Equipment used: Rolling walker (2 wheels) Transfers: Sit to/from Stand, Bed to chair/wheelchair/BSC Sit to Stand: Min guard   Step pivot transfers: Min guard, Min  assist       General transfer comment: slightly labored movement    Ambulation/Gait Ambulation/Gait assistance: Min guard, Min assist Gait Distance (Feet): 55 Feet Assistive device:  Rolling walker (2 wheels) Gait Pattern/deviations: Decreased step length - right, Decreased step length - left, Decreased stride length Gait velocity: decreased     General Gait Details: slow labored cadence without loss of balance, limited mostly due to fatigue  Stairs            Wheelchair Mobility    Modified Rankin (Stroke Patients Only)       Balance Overall balance assessment: Needs assistance Sitting-balance support: Feet supported, No upper extremity supported Sitting balance-Leahy Scale: Good Sitting balance - Comments: seated at EOB   Standing balance support: During functional activity, Bilateral upper extremity supported Standing balance-Leahy Scale: Fair Standing balance comment: fair/good using RW                             Pertinent Vitals/Pain Pain Assessment Pain Assessment: No/denies pain    Home Living Family/patient expects to be discharged to:: Private residence Living Arrangements: Alone Available Help at Discharge: Family;Available PRN/intermittently Type of Home: House Home Access: Stairs to enter Entrance Stairs-Rails: Right Entrance Stairs-Number of Steps: 2   Home Layout: One level Home Equipment: Rollator (4 wheels);Cane - single point;Shower seat      Prior Function Prior Level of Function : Independent/Modified Independent;Driving;History of Falls (last six months)             Mobility Comments: household and short distanced community ambulator without AD, drives ADLs Comments: Independent, sometimes have groceries delivered; 2 falls in past 6 months per pt's daughter.     Hand Dominance   Dominant Hand: Right    Extremity/Trunk Assessment   Upper Extremity Assessment Upper Extremity Assessment: Generalized weakness    Lower Extremity Assessment Lower Extremity Assessment: Generalized weakness    Cervical / Trunk Assessment Cervical / Trunk Assessment: Normal  Communication   Communication: No  difficulties  Cognition Arousal/Alertness: Awake/alert Behavior During Therapy: WFL for tasks assessed/performed Overall Cognitive Status: Within Functional Limits for tasks assessed                                          General Comments      Exercises     Assessment/Plan    PT Assessment Patient needs continued PT services  PT Problem List Decreased strength;Decreased activity tolerance;Decreased balance;Decreased mobility       PT Treatment Interventions DME instruction;Gait training;Stair training;Functional mobility training;Therapeutic activities;Therapeutic exercise;Balance training    PT Goals (Current goals can be found in the Care Plan section)  Acute Rehab PT Goals Patient Stated Goal: return home after rehab PT Goal Formulation: With patient Time For Goal Achievement: 09/21/22 Potential to Achieve Goals: Good    Frequency Min 3X/week     Co-evaluation               AM-PAC PT "6 Clicks" Mobility  Outcome Measure Help needed turning from your back to your side while in a flat bed without using bedrails?: A Little Help needed moving from lying on your back to sitting on the side of a flat bed without using bedrails?: A Little Help needed moving to and from a bed to a chair (including a wheelchair)?: A Little Help needed standing  up from a chair using your arms (e.g., wheelchair or bedside chair)?: A Little Help needed to walk in hospital room?: A Little Help needed climbing 3-5 steps with a railing? : A Lot 6 Click Score: 17    End of Session   Activity Tolerance: Patient tolerated treatment well;Patient limited by fatigue Patient left: in bed;with call bell/phone within reach Nurse Communication: Mobility status PT Visit Diagnosis: Unsteadiness on feet (R26.81);Other abnormalities of gait and mobility (R26.89);Muscle weakness (generalized) (M62.81)    Time: 1610-9604 PT Time Calculation (min) (ACUTE ONLY): 12 min   Charges:    PT Evaluation $PT Eval Low Complexity: 1 Low PT Treatments $Therapeutic Activity: 8-22 mins        2:42 PM, 09/07/22 Ocie Bob, MPT Physical Therapist with [facility] [phone] office [phone] mobile phone

## 2022-09-07 NOTE — Procedures (Signed)
INDICATION: Ascites EXAM: ULTRASOUND GUIDED LEFT LOWER QUADRANT PARACENTESIS GRIP-IR: Category: Fluids  Subcategory: Paracentesis  Follow-Up: None  MEDICATIONS: None. COMPLICATIONS: None immediate. PROCEDURE: Informed written consent was obtained from the patient after a discussion of the risks, benefits and alternatives to treatment. A timeout was performed prior to the initiation of the procedure.  Initial ultrasound scanning demonstrates a large amount of ascites within the left lower abdominal quadrant. The left lower abdomen was prepped and draped in the usual sterile fashion. 1% lidocaine  was used for local anesthesia.   Following this, a Yueh catheter was introduced. An ultrasound image was saved for documentation purposes. The paracentesis was performed. The catheter was removed and a dressing was applied. The patient tolerated the procedure well without immediate post procedural complication.   FINDINGS: A total of approximately 4 L of straw-colored fluid was removed. Samples were sent to the laboratory as requested by the clinical team. IMPRESSION:  Successful ultrasound-guided paracentesis yielding 4 liters of peritoneal fluid.

## 2022-09-07 NOTE — TOC Progression Note (Addendum)
Transition of Care [facility]) - Progression Note    Patient Details  Name: [name] MRN: [id number] Date of Birth: Aug 11, 1950  [facility] CM/SW Contact  Villa Herb, [place] Phone Number: 09/07/2022, 10:29 AM  Clinical Narrative:    TOC awaiting PT eval. Once eval is completed and documented, TOC will start insurance auth for [facility] re[facility]. Plan is for pt to return to SNF. TOC to follow.   PT note in. TOC to start insurance auth for [employer]. TOC to follow.   Expected Discharge Plan: Skilled Nursing Facility Barriers to Discharge: Continued Medical Work up  Expected Discharge Plan and Services In-house Referral: Clinical Social Work Discharge Planning Services: CM Consult Post Acute Care Choice: Skilled Nursing Facility Living arrangements for the past 2 months: Single Family Home                                       Social Determinants of Health (SDOH) Interventions SDOH Screenings   Food Insecurity: No Food Insecurity (09/03/2022)  Housing: Low Risk  (09/03/2022)  Transportation Needs: No Transportation Needs (09/03/2022)  Utilities: Not At Risk (09/03/2022)  Alcohol Screen: Low Risk  (10/30/2019)  Depression (PHQ2-9): Medium Risk (10/30/2019)  Financial Resource Strain: Medium Risk (10/30/2019)  Physical Activity: Inactive (10/30/2019)  Social Connections: Moderately Integrated (10/30/2019)  Stress: No Stress Concern Present (10/30/2019)  Tobacco Use: Low Risk  (09/03/2022)    Readmission Risk Interventions    09/04/2022   12:38 PM  Readmission Risk Prevention Plan  Transportation Screening Complete  HRI or Home Care Consult Complete  Social Work Consult for Recovery Care Planning/Counseling Complete  Palliative Care Screening Complete  Medication Review Oceanographer) Complete

## 2022-09-07 NOTE — Progress Notes (Signed)
PROGRESS NOTE   [name]  DGU:[id number] DOB: 09-03-1950 DOA: 09/03/2022 PCP: Benita Stabile, MD   Chief Complaint  Patient presents with   foley catheter not draining   Level of care: Telemetry  Brief Admission History:   73 y.o. female with medical history significant of NASH liver cirrhosis, T2DM, essential hypertension, hypothyroidism, chronic kidney disease stage III who presents to the emergency department from [facility] in [place] due to lower extremity edema and non-draining Foley.  She was accompanied by son at bedside. Patient was recently seen by her urologist due to chronic vaginal/bladder prolapse and a pessary was placed, a small laceration was noted on the prolapsed bladder, this was repaired and a Foley catheter was placed to allow the bladder to rest while healing.  The Foley drained partially on that day, but patient returned to the urologist and the Foley was readjusted, unfortunately, it was still not draining well.  She was seen in the emergency department on 5/23 due to UTI whereby, IV Rocephin was given in the ED.  Foley catheter was changed due to urine draining around the catheter.  Apparently, patient has been prescribed with Bactrim by her gynecologist, she was advised to continue with the medication, she was discharged home from the ED.  She complained of increased leg swelling and increased abdominal girth which she thought was due to increased fluid due to ascites/cirrhosis.  Patient also complained of shortness of breath within the last 2 days.  She went to see her endocrinologist today and she was sent to the ED for diuresis and Foley catheter replacement.  Patient was recently admitted from 5/28 to 5/31 due to acute kidney injury superimposed on chronic kidney disease   ED Course:  In the emergency department, HR was 55, BP 111/56 and other vital signs are within normal range.  Workup in the ED showed WBC 7.1, hemoglobin 10.2, hematocrit 30.6, MCV 98.1, platelets 54  BMP showed sodium 131, potassium 3.8, chloride 104, bicarb 19, glucose 143, BUN 61, creatinine 3.87, albumin 2.3, BNP 274, magnesium 2.4 She was treated with IV Lasix 40 mg x 1.  Hospitalist was asked to admit patient for further evaluation and management.   Assessment and Plan:  Acute kidney injury superimposed on chronic kidney disease stage 3b rapidly progressing to stage IV  Pt remains oliguric and on CT scan bladder empty Nephrologist consulted and making recommendations Renally adjust medications, avoid nephrotoxic agents/dehydration/hypotension Moderate ascites noted on CT scan, discussed with Dr. Ronne Binning will proceed with paracentesis if possible Nephrology requested to limit to 3-4L and give with albumin US paracentesis planned for 6/10 with radiology, will be her first one  IV albumin ordered to be given after paracentesis on 6/10  Oliguria  -pt barely producing about 250 cc urine per day -continue to follow closely -appreciate nephrology consult and recommendations -pt received multiple doses of IV albumin with some initial improvement in UOP  Prolapsed Bladder with pessary in place -urologist Dr. Ronne Binning consulted and seen and left note recommending proceed with paracentesis    Elevated BNP, acute heart failure ruled out BNP 274, this may be due to declining renal function Chest x-ray showed mild interstitial prominence bilaterally, possible edema or infiltrate IV Lasix 40 mg x 1 was given in the ED Nephrologist consulted due to worsening kidney status Continue total input/output, daily weights and fluid restriction Continue heart healthy/carb modified diet       Echocardiogram reassuring, LVEF 60-65% no RWMAs  Hypoalbuminemia possibly due  to moderate protein calorie malnutrition Albumin 2.3, protein supplement will be provided IV albumin ordered by nephrology   Thrombocytopenia possibly due to NASH cirrhosis of the liver Platelets 52, continue to monitor platelet  levels   Prolonged QT interval (chronic) QTc 501 ms Avoid QT prolonging drugs Magnesium level 2.4    Hypertension with renal disease BP meds will be held at this time due to soft BP   Type 2 diabetes mellitus with diabetic chronic kidney disease Hemoglobin A1c was 6.0 on 08/17/2022 Continue ISS and hypoglycemic protocol  CBG (last 3)  Recent Labs    09/06/22 2016 09/07/22 0757 09/07/22 1139  GLUCAP 178* 148* 136*   Acquired hypothyroidism Continue Synthroid   DVT prophylaxis: SCDs Code Status: Full  Family Communication: sister at bedside 6/7  Disposition: Status is: Inpatient Remains inpatient appropriate because: IV treatments required, urology, nephrology consultation   Consultants:   Procedures:   Antimicrobials:    Subjective: Pt remains oliguric and anxious about 1st paracentesis (planned for today)   Objective: Vitals:   09/07/22 1420 09/07/22 1421 09/07/22 1430 09/07/22 1502  BP: (!) 132/52 (!) 131/54 (!) 123/50 (!) 121/45  Pulse: 60 61 62 (!) 54  Resp: 16 18 16    Temp:    97.6 F (36.4 C)  TempSrc:    Oral  SpO2: 97% 96% 97% 100%  Weight:      Height:        Intake/Output Summary (Last 24 hours) at 09/07/2022 1544 Last data filed at 09/07/2022 1530 Gross per 24 hour  Intake 757.46 ml  Output 150 ml  Net 607.46 ml   Filed Weights   09/05/22 0433 09/06/22 0509 09/07/22 0500  Weight: 98 kg 95.8 kg 96.8 kg   Examination:  General exam: Appears volume overloaded and chronically ill, sitting up in bed, calm and comfortable, NAD.  Respiratory system: no increased work of breathing.   Cardiovascular system: normal S1 & S2 heard. No JVD, murmurs, rubs, gallops or clicks. 2+ pedal edema. Gastrointestinal system: Abdomen is distended with ascites, soft and nontender. No organomegaly or masses felt. Normal bowel sounds heard. Central nervous system: Alert and oriented. No focal neurological deficits. Extremities: 2+ edema BLEs.  Skin: No rashes,  lesions or ulcers. Psychiatry: Judgement and insight appear normal. Mood & affect appropriate.   Data Reviewed: I have personally reviewed following labs and imaging studies  CBC: Recent Labs  Lab 09/03/22 1625 09/04/22 0452 09/05/22 0421 09/06/22 0403 09/07/22 0553  WBC 7.1 4.3 3.8* 4.2 6.6  NEUTROABS 4.9  --   --   --   --   HGB 10.2* 9.2* 8.1* 8.2* 9.6*  HCT 30.6* 27.2* 24.3* 24.8* 27.9*  MCV 98.1 98.2 99.2 99.6 99.3  PLT 54* 34* 30* 34* 52*    Basic Metabolic Panel: Recent Labs  Lab 09/03/22 1625 09/04/22 0452 09/05/22 0421 09/06/22 0403 09/07/22 0553  NA 131* 132* 132* 131* 129*  K 3.8 3.7 3.7 3.9 4.1  CL 104 104 105 104 102  CO2 19* 18* 19* 18* 20*  GLUCOSE 143* 111* 122* 118* 149*  BUN 61* 65* 64* 65* 68*  CREATININE 3.87* 3.92* 3.92* 3.95* 4.09*  CALCIUM 7.8* 7.7* 7.9* 7.8* 8.0*  MG 2.4 2.4 2.4  --   --   PHOS  --  5.0*  --  5.0* 5.1*    CBG: Recent Labs  Lab 09/06/22 1115 09/06/22 1617 09/06/22 2016 09/07/22 0757 09/07/22 1139  GLUCAP 161* 135* 178* 148* 136*  Recent Results (from the past 240 hour(s))  Urine Culture     Status: Abnormal   Collection Time: 09/04/22  4:22 AM   Specimen: Urine, Random  Result Value Ref Range Status   Specimen Description   Final    URINE, RANDOM Performed at [facility], [address] [place], [place] [address]    Special Requests   Final    NONE Reflexed from [id number] Performed at [facility], [address] [place], [place] [address]    Culture (A)  Final    20,000 COLONIES/mL ENTEROCOCCUS FAECALIS 30,000 COLONIES/mL YEAST    Report Status 09/07/2022 FINAL  Final   Organism ID, Bacteria ENTEROCOCCUS FAECALIS (A)  Final      Susceptibility   Enterococcus faecalis - MIC*    AMPICILLIN <=2 SENSITIVE Sensitive     NITROFURANTOIN <=16 SENSITIVE Sensitive     VANCOMYCIN 1 SENSITIVE Sensitive     * 20,000 COLONIES/mL ENTEROCOCCUS FAECALIS     Radiology Studies: US Paracentesis  Result Date:  09/07/2022 INDICATION: Ascites EXAM: ULTRASOUND GUIDED LEFT LOWER QUADRANT PARACENTESIS MEDICATIONS: None. COMPLICATIONS: None immediate. PROCEDURE: Informed written consent was obtained from the patient after a discussion of the risks, benefits and alternatives to treatment. A timeout was performed prior to the initiation of the procedure. Initial ultrasound scanning demonstrates a large amount of ascites within the left lower abdominal quadrant. The left lower abdomen was prepped and draped in the usual sterile fashion. 1% lidocaine was used for local anesthesia. Following this, a Yueh catheter was introduced. An ultrasound image was saved for documentation purposes. The paracentesis was performed. The catheter was removed and a dressing was applied. The patient tolerated the procedure well without immediate post procedural complication. FINDINGS: A total of approximately 4 L of straw-colored fluid was removed. Samples were sent to the laboratory as requested by the clinical team. IMPRESSION: 1. Successful ultrasound-guided paracentesis yielding 4 liters of peritoneal fluid. Electronically Signed   By: Gaylyn Rong M.D.   On: 09/07/2022 15:20    Scheduled Meds:  feeding supplement (GLUCERNA SHAKE)  237 mL Oral TID BM   insulin aspart  0-5 Units Subcutaneous QHS   insulin aspart  0-9 Units Subcutaneous TID WC   levothyroxine  125 mcg Oral Q0600   midodrine  5 mg Oral TID WC   nystatin  5 mL Oral QID   Continuous Infusions:   LOS: 3 days   Time spent: 35 mins  Malakhai Beitler Laural Benes, MD How to contact the [facility] Attending or Consulting provider 7A - 7P or covering provider during after hours 7P -7A, for this patient?  Check the care team in [facility] and look for a) attending/consulting [facility] provider listed and b) the [facility] team listed Log into [email or web address] and use [facility] universal password to access. If you do not have the password, please contact the hospital operator. Locate the [facility] provider you  are looking for under [facility] and page to a number that you can be directly reached. If you still have difficulty reaching the provider, please page the [facility] (Director on Call) for the Hospitalists listed on amion for assistance.  09/07/2022, 3:44 PM

## 2022-09-07 NOTE — Progress Notes (Signed)
Patient ID: Val Eagle, female   DOB: [birth year], 72 y.o.   MRN: [id number] S: No new complaints or events overnight. O:BP (!) 109/43 (BP Location: Left Arm)   Pulse 60   Temp 98.7 F (37.1 C) (Oral)   Resp 18   Ht 5\' 6"  (1.676 m)   Wt 96.8 kg   SpO2 97%   BMI 34.44 kg/m   Intake/Output Summary (Last 24 hours) at 09/07/2022 0830 Last data filed at 09/07/2022 0500 Gross per 24 hour  Intake 440 ml  Output 200 ml  Net 240 ml   Intake/Output: I/O last 3 completed shifts: In: 680 [P.O.:680] Out: 300 [Urine:300]  Intake/Output this shift:  No intake/output data recorded. Weight change: 0.955 kg Gen: NAD CVS: RRR Resp:CTA Abd: distended, + fluid wave, nontender Ext: 1+ pitting edema BLE  Recent Labs  Lab 09/03/22 1625 09/04/22 0452 09/05/22 0421 09/06/22 0403 09/07/22 0553  NA 131* 132* 132* 131* 129*  K 3.8 3.7 3.7 3.9 4.1  CL 104 104 105 104 102  CO2 19* 18* 19* 18* 20*  GLUCOSE 143* 111* 122* 118* 149*  BUN 61* 65* 64* 65* 68*  CREATININE 3.87* 3.92* 3.92* 3.95* 4.09*  ALBUMIN 2.3* 2.1* 2.9* 2.6* 2.9*  CALCIUM 7.8* 7.7* 7.9* 7.8* 8.0*  PHOS  --  5.0*  --  5.0* 5.1*  AST 34 30 25  --   --   ALT 24 20 17   --   --    Liver Function Tests: Recent Labs  Lab 09/03/22 1625 09/04/22 0452 09/05/22 0421 09/06/22 0403 09/07/22 0553  AST 34 30 25  --   --   ALT 24 20 17   --   --   ALKPHOS 89 79 65  --   --   BILITOT 1.3* 1.4* 1.6*  --   --   PROT 5.1* 4.6* 4.9*  --   --   ALBUMIN 2.3* 2.1* 2.9* 2.6* 2.9*   No results for input(s): "LIPASE", "AMYLASE" in the last 168 hours. No results for input(s): "AMMONIA" in the last 168 hours. CBC: Recent Labs  Lab 09/03/22 1625 09/04/22 0452 09/05/22 0421 09/06/22 0403 09/07/22 0553  WBC 7.1 4.3 3.8* 4.2 6.6  NEUTROABS 4.9  --   --   --   --   HGB 10.2* 9.2* 8.1* 8.2* 9.6*  HCT 30.6* 27.2* 24.3* 24.8* 27.9*  MCV 98.1 98.2 99.2 99.6 99.3  PLT 54* 34* 30* 34* 52*   Cardiac Enzymes: No results for input(s):  "CKTOTAL", "CKMB", "CKMBINDEX", "TROPONINI" in the last 168 hours. CBG: Recent Labs  Lab 09/06/22 0706 09/06/22 1115 09/06/22 1617 09/06/22 2016 09/07/22 0757  GLUCAP 110* 161* 135* 178* 148*    Iron Studies: No results for input(s): "IRON", "TIBC", "TRANSFERRIN", "FERRITIN" in the last 72 hours. Studies/Results: No results found.  feeding supplement (GLUCERNA SHAKE)  237 mL Oral TID BM   insulin aspart  0-5 Units Subcutaneous QHS   insulin aspart  0-9 Units Subcutaneous TID WC   levothyroxine  125 mcg Oral Q0600   midodrine  5 mg Oral TID WC   nystatin  5 mL Oral QID    BMET    Component Value Date/Time   NA 129 (L) 09/07/2022 0553   K 4.1 09/07/2022 0553   CL 102 09/07/2022 0553   CO2 20 (L) 09/07/2022 0553   GLUCOSE 149 (H) 09/07/2022 0553   BUN 68 (H) 09/07/2022 0553   CREATININE 4.09 (H) 09/07/2022 0553   CALCIUM 8.0 (  L) 09/07/2022 0553   GFRNONAA 11 (L) 09/07/2022 0553   GFRAA >60 08/11/2019 1738   CBC    Component Value Date/Time   WBC 6.6 09/07/2022 0553   RBC 2.81 (L) 09/07/2022 0553   HGB 9.6 (L) 09/07/2022 0553   HCT 27.9 (L) 09/07/2022 0553   PLT 52 (L) 09/07/2022 0553   MCV 99.3 09/07/2022 0553   MCH 34.2 (H) 09/07/2022 0553   MCHC 34.4 09/07/2022 0553   RDW 15.9 (H) 09/07/2022 0553   LYMPHSABS 1.2 09/03/2022 1625   MONOABS 0.5 09/03/2022 1625   EOSABS 0.5 09/03/2022 1625   BASOSABS 0.0 09/03/2022 1625     Assessment/Plan:  AKI/CKD stage IIIb - pt with history of bladder prolapse and intermittent ureteral obstruction, recent admission with AKI due to volume depletion and Bactrim use, now with worsening anasarca and ascites.  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  She was started on midodrine and albumin IV without significant improvement.  No uremic symptoms or indication for dialysis at this time.  Will continue to follow closely.  Lasix on hold and is to have a low  volume paracentesis some time today.  Please limit to 3-4 L and administer IV albumin during paracentesis to help prevent further renal injury. NASH cirrhosis - hold nadalol.  Albumin and midodrine, paracentesis as above. Bladder prolapse - urology evaluated pt and no evidence of ureteral obstruction at this time. Anemia of CKD stage IIIb - continue to follow Hgb and iron stores.  Moderate protein malnutrition - in setting of NASH cirrhosis. HTN - bp meds on hold due to soft Bp DM type 2 - per primary.  Irena Cords, MD [employer] [phone]

## 2022-09-08 DIAGNOSIS — N179 Acute kidney failure, unspecified: Secondary | ICD-10-CM | POA: Diagnosis not present

## 2022-09-08 DIAGNOSIS — N189 Chronic kidney disease, unspecified: Secondary | ICD-10-CM | POA: Diagnosis not present

## 2022-09-08 DIAGNOSIS — E8809 Other disorders of plasma-protein metabolism, not elsewhere classified: Secondary | ICD-10-CM | POA: Diagnosis not present

## 2022-09-08 DIAGNOSIS — I129 Hypertensive chronic kidney disease with stage 1 through stage 4 chronic kidney disease, or unspecified chronic kidney disease: Secondary | ICD-10-CM | POA: Diagnosis not present

## 2022-09-08 LAB — CBC
HCT: 26.5 % — ABNORMAL LOW (ref 36.0–46.0)
Hemoglobin: 8.9 g/dL — ABNORMAL LOW (ref 12.0–15.0)
MCH: 33.5 pg (ref 26.0–34.0)
MCHC: 33.6 g/dL (ref 30.0–36.0)
MCV: 99.6 fL (ref 80.0–100.0)
Platelets: 36 10*3/uL — ABNORMAL LOW (ref 150–400)
RBC: 2.66 MIL/uL — ABNORMAL LOW (ref 3.87–5.11)
RDW: 15.8 % — ABNORMAL HIGH (ref 11.5–15.5)
WBC: 4 10*3/uL (ref 4.0–10.5)
nRBC: 0 % (ref 0.0–0.2)

## 2022-09-08 LAB — RENAL FUNCTION PANEL
Albumin: 2.7 g/dL — ABNORMAL LOW (ref 3.5–5.0)
Anion gap: 10 (ref 5–15)
BUN: 69 mg/dL — ABNORMAL HIGH (ref 8–23)
CO2: 18 mmol/L — ABNORMAL LOW (ref 22–32)
Calcium: 8.1 mg/dL — ABNORMAL LOW (ref 8.9–10.3)
Chloride: 102 mmol/L (ref 98–111)
Creatinine, Ser: 3.95 mg/dL — ABNORMAL HIGH (ref 0.44–1.00)
GFR, Estimated: 12 mL/min — ABNORMAL LOW (ref >60)
Glucose, Bld: 147 mg/dL — ABNORMAL HIGH (ref 70–99)
Phosphorus: 5 mg/dL — ABNORMAL HIGH (ref 2.5–4.6)
Potassium: 4 mmol/L (ref 3.5–5.1)
Sodium: 130 mmol/L — ABNORMAL LOW (ref 135–145)

## 2022-09-08 LAB — GLUCOSE, CAPILLARY
Glucose-Capillary: 141 mg/dL — ABNORMAL HIGH (ref 70–99)
Glucose-Capillary: 171 mg/dL — ABNORMAL HIGH (ref 70–99)
Glucose-Capillary: 165 mg/dL — ABNORMAL HIGH (ref 70–99)
Glucose-Capillary: 152 mg/dL — ABNORMAL HIGH (ref 70–99)

## 2022-09-08 LAB — CBC WITH DIFFERENTIAL/PLATELET
Abs Immature Granulocytes: 0.02 10*3/uL (ref 0.00–0.07)
Basophils Relative: 0 %
Eosinophils Absolute: 0.5 10*3/uL (ref 0.0–0.5)
Eosinophils Relative: 6 %
HCT: 30.6 % — ABNORMAL LOW (ref 36.0–46.0)
Immature Granulocytes: 0 %
Lymphs Abs: 1.2 10*3/uL (ref 0.7–4.0)
MCH: 32.7 pg (ref 26.0–34.0)
MCHC: 33.3 g/dL (ref 30.0–36.0)
MCV: 98.1 fL (ref 80.0–100.0)
Monocytes Absolute: 0.5 10*3/uL (ref 0.1–1.0)
Monocytes Relative: 7 %
Neutro Abs: 4.9 10*3/uL (ref 1.7–7.7)
Neutrophils Relative %: 70 %
RBC: 3.12 MIL/uL — ABNORMAL LOW (ref 3.87–5.11)
WBC: 7.1 10*3/uL (ref 4.0–10.5)

## 2022-09-08 LAB — CULTURE, BODY FLUID W GRAM STAIN -BOTTLE

## 2022-09-08 LAB — IRON AND TIBC
Iron: 56 ug/dL (ref 28–170)
Saturation Ratios: 36 % — ABNORMAL HIGH (ref 10.4–31.8)
TIBC: 154 ug/dL — ABNORMAL LOW (ref 250–450)
UIBC: 98 ug/dL

## 2022-09-08 MED ORDER — ALBUMIN HUMAN 25 % IV SOLN
25.0000 g | Freq: Once | INTRAVENOUS | Status: AC
  Administered 2022-09-08: 25 g via INTRAVENOUS
  Filled 2022-09-08: qty 100

## 2022-09-08 NOTE — Progress Notes (Signed)
PROGRESS NOTE   [name]  ZOX:[id number] DOB: Sep 29, 1950 DOA: 09/03/2022 PCP: Benita Stabile, MD   Chief Complaint  Patient presents with   foley catheter not draining   Level of care: Telemetry  Brief Admission History:   72 y.o. female with medical history significant of NASH liver cirrhosis, T2DM, essential hypertension, hypothyroidism, chronic kidney disease stage III who presents to the emergency department from [facility] in [place] due to lower extremity edema and non-draining Foley.  She was accompanied by son at bedside. Patient was recently seen by her urologist due to chronic vaginal/bladder prolapse and a pessary was placed, a small laceration was noted on the prolapsed bladder, this was repaired and a Foley catheter was placed to allow the bladder to rest while healing.  The Foley drained partially on that day, but patient returned to the urologist and the Foley was readjusted, unfortunately, it was still not draining well.  She was seen in the emergency department on 5/23 due to UTI whereby, IV Rocephin was given in the ED.  Foley catheter was changed due to urine draining around the catheter.  Apparently, patient has been prescribed with Bactrim by her gynecologist, she was advised to continue with the medication, she was discharged home from the ED.  She complained of increased leg swelling and increased abdominal girth which she thought was due to increased fluid due to ascites/cirrhosis.  Patient also complained of shortness of breath within the last 2 days.  She went to see her endocrinologist today and she was sent to the ED for diuresis and Foley catheter replacement.  Patient was recently admitted from 5/28 to 5/31 due to acute kidney injury superimposed on chronic kidney disease   ED Course:  In the emergency department, HR was 55, BP 111/56 and other vital signs are within normal range.  Workup in the ED showed WBC 7.1, hemoglobin 10.2, hematocrit 30.6, MCV 98.1, platelets 54  BMP showed sodium 131, potassium 3.8, chloride 104, bicarb 19, glucose 143, BUN 61, creatinine 3.87, albumin 2.3, BNP 274, magnesium 2.4 She was treated with IV Lasix 40 mg x 1.  Hospitalist was asked to admit patient for further evaluation and management.   Assessment and Plan:  Acute kidney injury superimposed on chronic kidney disease stage 3b rapidly progressing to stage IV  Pt remains oliguric and on CT scan bladder empty Nephrologist consulted and making recommendations Renally adjust medications, avoid nephrotoxic agents/dehydration/hypotension Moderate ascites noted on CT scan, discussed with Dr. Ronne Binning will proceed with paracentesis if possible Nephrology requested to limit to 3-4L and give with albumin US paracentesis planned for 6/10 with radiology, will be her first one  IV albumin ordered to be given after paracentesis on 6/10  Oliguria  -pt barely producing about 250 cc urine per day -continue to follow closely -appreciate nephrology consult and recommendations -pt received multiple doses of IV albumin with some initial improvement in UOP -additional IV albumin ordered by nephrologist today  Prolapsed Bladder with pessary in place -urologist Dr. Ronne Binning consulted and seen and left note recommending proceed with paracentesis    Elevated BNP, acute heart failure ruled out BNP 274, this may be due to declining renal function Chest x-ray showed mild interstitial prominence bilaterally, possible edema or infiltrate IV Lasix 40 mg x 1 was given in the ED Nephrologist consulted due to worsening kidney status Continue total input/output, daily weights and fluid restriction Continue heart healthy/carb modified diet       Echocardiogram reassuring, LVEF  60-65% no RWMAs  Hypoalbuminemia possibly due to moderate protein calorie malnutrition Albumin 2.3, protein supplement will be provided Additional IV albumin ordered by nephrology 6/10, 6/11   Thrombocytopenia possibly due  to NASH cirrhosis of the liver Platelets 52, continue to monitor platelet levels   Prolonged QT interval (chronic) QTc 501 ms Avoid QT prolonging drugs Magnesium level 2.4   Hypertension with renal disease BP meds will be held at this time due to soft BP   Type 2 diabetes mellitus with diabetic chronic kidney disease Hemoglobin A1c was 6.0 on 08/17/2022 Continue ISS and hypoglycemic protocol  CBG (last 3)  Recent Labs    09/07/22 1702 09/07/22 2101 09/08/22 0758  GLUCAP 170* 204* 141*   Acquired hypothyroidism Continue Synthroid   DVT prophylaxis: SCDs Code Status: Full  Family Communication: sister at bedside 6/7, daughter phone 6/11 Disposition: Status is: Inpatient Remains inpatient appropriate because: IV treatments required, urology, nephrology consultation   Consultants:   Procedures:   Antimicrobials:    Subjective: Pt tolerated paracentesis, 4L removed, urine output increased some yesterday.    Objective: Vitals:   09/07/22 1430 09/07/22 1502 09/07/22 2100 09/08/22 0601  BP: (!) 123/50 (!) 121/45 (!) 128/57 (!) 104/46  Pulse: 62 (!) 54 63 (!) 56  Resp: 16  20 20   Temp:  97.6 F (36.4 C) (!) 97.4 F (36.3 C) 97.7 F (36.5 C)  TempSrc:  Oral Oral Oral  SpO2: 97% 100% 98% 97%  Weight:    93 kg  Height:        Intake/Output Summary (Last 24 hours) at 09/08/2022 1034 Last data filed at 09/08/2022 0330 Gross per 24 hour  Intake 317.46 ml  Output 850 ml  Net -532.54 ml   Filed Weights   09/06/22 0509 09/07/22 0500 09/08/22 0601  Weight: 95.8 kg 96.8 kg 93 kg   Examination:  General exam: Appears volume overloaded and chronically ill, sitting up in bed, calm and comfortable, NAD.  Respiratory system: no increased work of breathing.   Cardiovascular system: normal S1 & S2 heard. No JVD, murmurs, rubs, gallops or clicks. 2+ pedal edema. Gastrointestinal system: Abdomen is less distended with ascites post para, soft and nontender. No organomegaly or  masses felt. Normal bowel sounds heard. Central nervous system: Alert and oriented. No focal neurological deficits. Extremities: 2+ edema BLEs.  Skin: No rashes, lesions or ulcers. Psychiatry: Judgement and insight appear normal. Mood & affect appropriate.   Data Reviewed: I have personally reviewed following labs and imaging studies  CBC: Recent Labs  Lab 09/03/22 1625 09/04/22 0452 09/05/22 0421 09/06/22 0403 09/07/22 0553 09/08/22 0440  WBC 7.1 4.3 3.8* 4.2 6.6 4.0  NEUTROABS 4.9  --   --   --   --   --   HGB 10.2* 9.2* 8.1* 8.2* 9.6* 8.9*  HCT 30.6* 27.2* 24.3* 24.8* 27.9* 26.5*  MCV 98.1 98.2 99.2 99.6 99.3 99.6  PLT 54* 34* 30* 34* 52* 36*    Basic Metabolic Panel: Recent Labs  Lab 09/03/22 1625 09/04/22 0452 09/05/22 0421 09/06/22 0403 09/07/22 0553 09/08/22 0440  NA 131* 132* 132* 131* 129* 130*  K 3.8 3.7 3.7 3.9 4.1 4.0  CL 104 104 105 104 102 102  CO2 19* 18* 19* 18* 20* 18*  GLUCOSE 143* 111* 122* 118* 149* 147*  BUN 61* 65* 64* 65* 68* 69*  CREATININE 3.87* 3.92* 3.92* 3.95* 4.09* 3.95*  CALCIUM 7.8* 7.7* 7.9* 7.8* 8.0* 8.1*  MG 2.4 2.4 2.4  --   --   --  PHOS  --  5.0*  --  5.0* 5.1* 5.0*    CBG: Recent Labs  Lab 09/07/22 0757 09/07/22 1139 09/07/22 1702 09/07/22 2101 09/08/22 0758  GLUCAP 148* 136* 170* 204* 141*    Recent Results (from the past 240 hour(s))  Urine Culture     Status: Abnormal   Collection Time: 09/04/22  4:22 AM   Specimen: Urine, Random  Result Value Ref Range Status   Specimen Description   Final    URINE, RANDOM Performed at [facility], [address], [place], [place] [address]    Special Requests   Final    NONE Reflexed from [id number] Performed at [facility], [address], [place], [place] [address]    Culture (A)  Final    20,000 COLONIES/mL ENTEROCOCCUS FAECALIS 30,000 COLONIES/mL YEAST    Report Status 09/07/2022 FINAL  Final   Organism ID, Bacteria ENTEROCOCCUS FAECALIS (A)  Final       Susceptibility   Enterococcus faecalis - MIC*    AMPICILLIN <=2 SENSITIVE Sensitive     NITROFURANTOIN <=16 SENSITIVE Sensitive     VANCOMYCIN 1 SENSITIVE Sensitive     * 20,000 COLONIES/mL ENTEROCOCCUS FAECALIS  Culture, body fluid w Gram Stain-bottle     Status: None (Preliminary result)   Collection Time: 09/07/22  2:00 PM   Specimen: Ascitic  Result Value Ref Range Status   Specimen Description ASCITIC  Final   Special Requests BAA 10CC  Final   Culture   Final    NO GROWTH < 24 HOURS Performed at [facility], [address] [place], [place] [address]    Report Status PENDING  Incomplete  Gram stain     Status: None   Collection Time: 09/07/22  2:00 PM   Specimen: Ascitic  Result Value Ref Range Status   Specimen Description ASCITIC  Final   Special Requests NONE  Final   Gram Stain   Final    WBC PRESENT,BOTH PMN AND MONONUCLEAR NO ORGANISMS SEEN CYTOSPIN SMEAR Performed at [facility], [address], [place], [place] [address]    Report Status 09/07/2022 FINAL  Final     Radiology Studies: US Paracentesis  Result Date: 09/07/2022 INDICATION: Ascites EXAM: ULTRASOUND GUIDED LEFT LOWER QUADRANT PARACENTESIS MEDICATIONS: None. COMPLICATIONS: None immediate. PROCEDURE: Informed written consent was obtained from the patient after a discussion of the risks, benefits and alternatives to treatment. A timeout was performed prior to the initiation of the procedure. Initial ultrasound scanning demonstrates a large amount of ascites within the left lower abdominal quadrant. The left lower abdomen was prepped and draped in the usual sterile fashion. 1% lidocaine was used for local anesthesia. Following this, a Yueh catheter was introduced. An ultrasound image was saved for documentation purposes. The paracentesis was performed. The catheter was removed and a dressing was applied. The patient tolerated the procedure well without immediate post procedural complication. FINDINGS: A total  of approximately 4 L of straw-colored fluid was removed. Samples were sent to the laboratory as requested by the clinical team. IMPRESSION: 1. Successful ultrasound-guided paracentesis yielding 4 liters of peritoneal fluid. Electronically Signed   By: Gaylyn Rong M.D.   On: 09/07/2022 15:20    Scheduled Meds:  feeding supplement (GLUCERNA SHAKE)  237 mL Oral TID BM   insulin aspart  0-5 Units Subcutaneous QHS   insulin aspart  0-9 Units Subcutaneous TID WC   levothyroxine  125 mcg Oral Q0600   midodrine  5 mg Oral TID WC  nystatin  5 mL Oral QID   Continuous Infusions:  albumin human      LOS: 4 days   Time spent: 35 mins  Misaki Sozio Laural Benes, MD How to contact the [facility] Attending or Consulting provider 7A - 7P or covering provider during after hours 7P -7A, for this patient?  Check the care team in [facility] and look for a) attending/consulting TRH provider listed and b) the [facility] team listed Log into [email or web address] and use [facility]'s universal password to access. If you do not have the password, please contact the hospital operator. Locate the [facility] provider you are looking for under [facility] and page to a number that you can be directly reached. If you still have difficulty reaching the provider, please page the [facility] (Director on Call) for the Hospitalists listed on amion for assistance.  09/08/2022, 10:34 AM

## 2022-09-08 NOTE — Progress Notes (Signed)
Mobility Specialist Progress Note:   09/08/22 1207  Mobility  Activity Ambulated with assistance in hallway  Level of Assistance Contact guard assist, steadying assist  Assistive Device Front wheel walker  Distance Ambulated (ft) 120 ft  Range of Motion/Exercises Active;All extremities  Activity Response Tolerated well  Mobility Referral Yes  $Mobility charge 1 Mobility  Mobility Specialist Start Time (ACUTE ONLY) 1150  Mobility Specialist Stop Time (ACUTE ONLY) 1205  Mobility Specialist Time Calculation (min) (ACUTE ONLY) 15 min   Pt agreeable to mobility, family in room encouraging ambulation in hallway. Tolerated well, used RW and CGA for safety. Returned pt to room, sitting up in chair. Family still in room, all needs met, chair alarm on.   Feliciana Rossetti Mobility Specialist Please contact via Special educational needs teacher or  Rehab office at [phone]

## 2022-09-08 NOTE — TOC Progression Note (Signed)
Transition of Care [facility]) - Progression Note    Patient Details  Name: [name] MRN: [id number] Date of Birth: 14-Mar-1951  Transition of Care [facility]) CM/SW Contact  [place], [place] Phone Number: 09/08/2022, 10:19 AM  Clinical Narrative:    Pts insurance Berkley Harvey has been approved for SNF at [facility]. Pt is approved 6/11 - 6/13, next review 6/13, Vesta Mixer id [id number], plan auth id [id number]   Expected Discharge Plan: Skilled Nursing Facility Barriers to Discharge: Continued Medical Work up  Expected Discharge Plan and Services In-house Referral: Clinical Social Work Discharge Planning Services: CM Consult Post Acute Care Choice: Skilled Nursing Facility Living arrangements for the past 2 months: Single Family Home                                       Social Determinants of Health (SDOH) Interventions SDOH Screenings   Food Insecurity: No Food Insecurity (09/03/2022)  Housing: Low Risk  (09/03/2022)  Transportation Needs: No Transportation Needs (09/03/2022)  Utilities: Not At Risk (09/03/2022)  Alcohol Screen: Low Risk  (10/30/2019)  Depression (PHQ2-9): Medium Risk (10/30/2019)  Financial Resource Strain: Medium Risk (10/30/2019)  Physical Activity: Inactive (10/30/2019)  Social Connections: Moderately Integrated (10/30/2019)  Stress: No Stress Concern Present (10/30/2019)  Tobacco Use: Low Risk  (09/07/2022)    Readmission Risk Interventions    09/04/2022   12:38 PM  Readmission Risk Prevention Plan  Transportation Screening Complete  HRI or Home Care Consult Complete  Social Work Consult for Recovery Care Planning/Counseling Complete  Palliative Care Screening Complete  Medication Review Oceanographer) Complete

## 2022-09-08 NOTE — Progress Notes (Signed)
Patient ID: [name], female   DOB: 1951-03-23, 72 y.o.   MRN: [id number] S: Feels better this morning.  Had 4 L removal with paracentesis yesterday. SCr with slight improvement. O:BP (!) 104/46 (BP Location: Left Arm)   Pulse (!) 56   Temp 97.7 F (36.5 C) (Oral)   Resp 20   Ht 5\' 6"  (1.676 m)   Wt 93 kg   SpO2 97%   BMI 33.09 kg/m   Intake/Output Summary (Last 24 hours) at 09/08/2022 1017 Last data filed at 09/08/2022 0330 Gross per 24 hour  Intake 317.46 ml  Output 850 ml  Net -532.54 ml   Intake/Output: I/O last 3 completed shifts: In: 757.5 [P.O.:680; IV Piggyback:77.5] Out: 1000 [Urine:1000]  Intake/Output this shift:  No intake/output data recorded. Weight change: -3.812 kg Gen: NAD CVS: RRR Resp:CTA Abd: +BS, soft, NT, + fluid wave Ext:2+ edema BLE  Recent Labs  Lab 09/03/22 1625 09/04/22 0452 09/05/22 0421 09/06/22 0403 09/07/22 0553 09/08/22 0440  NA 131* 132* 132* 131* 129* 130*  K 3.8 3.7 3.7 3.9 4.1 4.0  CL 104 104 105 104 102 102  CO2 19* 18* 19* 18* 20* 18*  GLUCOSE 143* 111* 122* 118* 149* 147*  BUN 61* 65* 64* 65* 68* 69*  CREATININE 3.87* 3.92* 3.92* 3.95* 4.09* 3.95*  ALBUMIN 2.3* 2.1* 2.9* 2.6* 2.9* 2.7*  CALCIUM 7.8* 7.7* 7.9* 7.8* 8.0* 8.1*  PHOS  --  5.0*  --  5.0* 5.1* 5.0*  AST 34 30 25  --   --   --   ALT 24 20 17   --   --   --    Liver Function Tests: Recent Labs  Lab 09/03/22 1625 09/04/22 0452 09/05/22 0421 09/06/22 0403 09/07/22 0553 09/08/22 0440  AST 34 30 25  --   --   --   ALT 24 20 17   --   --   --   ALKPHOS 89 79 65  --   --   --   BILITOT 1.3* 1.4* 1.6*  --   --   --   PROT 5.1* 4.6* 4.9*  --   --   --   ALBUMIN 2.3* 2.1* 2.9* 2.6* 2.9* 2.7*   No results for input(s): "LIPASE", "AMYLASE" in the last 168 hours. No results for input(s): "AMMONIA" in the last 168 hours. CBC: Recent Labs  Lab 09/03/22 1625 09/04/22 0452 09/05/22 0421 09/06/22 0403 09/07/22 0553 09/08/22 0440  WBC 7.1 4.3 3.8* 4.2 6.6  4.0  NEUTROABS 4.9  --   --   --   --   --   HGB 10.2* 9.2* 8.1* 8.2* 9.6* 8.9*  HCT 30.6* 27.2* 24.3* 24.8* 27.9* 26.5*  MCV 98.1 98.2 99.2 99.6 99.3 99.6  PLT 54* 34* 30* 34* 52* 36*   Cardiac Enzymes: No results for input(s): "CKTOTAL", "CKMB", "CKMBINDEX", "TROPONINI" in the last 168 hours. CBG: Recent Labs  Lab 09/07/22 0757 09/07/22 1139 09/07/22 1702 09/07/22 2101 09/08/22 0758  GLUCAP 148* 136* 170* 204* 141*    Iron Studies:  Recent Labs    09/08/22 0440  IRON 56  TIBC 154*   Studies/Results: US Paracentesis  Result Date: 09/07/2022 INDICATION: Ascites EXAM: ULTRASOUND GUIDED LEFT LOWER QUADRANT PARACENTESIS MEDICATIONS: None. COMPLICATIONS: None immediate. PROCEDURE: Informed written consent was obtained from the patient after a discussion of the risks, benefits and alternatives to treatment. A timeout was performed prior to the initiation of the procedure. Initial ultrasound scanning demonstrates a large amount  of ascites within the left lower abdominal quadrant. The left lower abdomen was prepped and draped in the usual sterile fashion. 1% lidocaine was used for local anesthesia. Following this, a Yueh catheter was introduced. An ultrasound image was saved for documentation purposes. The paracentesis was performed. The catheter was removed and a dressing was applied. The patient tolerated the procedure well without immediate post procedural complication. FINDINGS: A total of approximately 4 L of straw-colored fluid was removed. Samples were sent to the laboratory as requested by the clinical team. IMPRESSION: 1. Successful ultrasound-guided paracentesis yielding 4 liters of peritoneal fluid. Electronically Signed   By: Gaylyn Rong M.D.   On: 09/07/2022 15:20    feeding supplement (GLUCERNA SHAKE)  237 mL Oral TID BM   insulin aspart  0-5 Units Subcutaneous QHS   insulin aspart  0-9 Units Subcutaneous TID WC   levothyroxine  125 mcg Oral Q0600   midodrine  5 mg  Oral TID WC   nystatin  5 mL Oral QID    BMET    Component Value Date/Time   NA 130 (L) 09/08/2022 0440   K 4.0 09/08/2022 0440   CL 102 09/08/2022 0440   CO2 18 (L) 09/08/2022 0440   GLUCOSE 147 (H) 09/08/2022 0440   BUN 69 (H) 09/08/2022 0440   CREATININE 3.95 (H) 09/08/2022 0440   CALCIUM 8.1 (L) 09/08/2022 0440   GFRNONAA 12 (L) 09/08/2022 0440   GFRAA >60 08/11/2019 1738   CBC    Component Value Date/Time   WBC 4.0 09/08/2022 0440   RBC 2.66 (L) 09/08/2022 0440   HGB 8.9 (L) 09/08/2022 0440   HCT 26.5 (L) 09/08/2022 0440   PLT 36 (L) 09/08/2022 0440   MCV 99.6 09/08/2022 0440   MCH 33.5 09/08/2022 0440   MCHC 33.6 09/08/2022 0440   RDW 15.8 (H) 09/08/2022 0440   LYMPHSABS 1.2 09/03/2022 1625   MONOABS 0.5 09/03/2022 1625   EOSABS 0.5 09/03/2022 1625   BASOSABS 0.0 09/03/2022 1625    Assessment/Plan:   AKI/CKD stage IIIb - pt with history of bladder prolapse and intermittent ureteral obstruction, recent admission with AKI due to volume depletion and Bactrim use, now with worsening anasarca and ascites.  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  She was started on midodrine and albumin IV without significant improvement.  No uremic symptoms or indication for dialysis at this time.  Will continue to follow closely.  Lasix on hold and was given IV albumin during paracentesis yesterday with some increased UOP.  Will dose IV albumin again today and follow.   NASH cirrhosis - hold nadalol.  Albumin and midodrine, paracentesis 4L yesterday. Bladder prolapse - urology evaluated pt and no evidence of ureteral obstruction at this time. Anemia of CKD stage IIIb - continue to follow Hgb and iron stores.  Moderate protein malnutrition - in setting of NASH cirrhosis. HTN - bp meds on hold due to soft Bp DM type 2 - per primary.  Irena Cords, MD [facility]

## 2022-09-09 DIAGNOSIS — N189 Chronic kidney disease, unspecified: Secondary | ICD-10-CM | POA: Diagnosis not present

## 2022-09-09 DIAGNOSIS — N179 Acute kidney failure, unspecified: Secondary | ICD-10-CM | POA: Diagnosis not present

## 2022-09-09 LAB — CBC
HCT: 25.1 % — ABNORMAL LOW (ref 36.0–46.0)
Hemoglobin: 8.5 g/dL — ABNORMAL LOW (ref 12.0–15.0)
MCH: 33.5 pg (ref 26.0–34.0)
MCHC: 33.9 g/dL (ref 30.0–36.0)
MCV: 98.8 fL (ref 80.0–100.0)
Platelets: 35 10*3/uL — ABNORMAL LOW (ref 150–400)
RBC: 2.54 MIL/uL — ABNORMAL LOW (ref 3.87–5.11)
RDW: 15.9 % — ABNORMAL HIGH (ref 11.5–15.5)
WBC: 3.9 10*3/uL — ABNORMAL LOW (ref 4.0–10.5)
nRBC: 0 % (ref 0.0–0.2)

## 2022-09-09 LAB — GLUCOSE, CAPILLARY
Glucose-Capillary: 128 mg/dL — ABNORMAL HIGH (ref 70–99)
Glucose-Capillary: 133 mg/dL — ABNORMAL HIGH (ref 70–99)
Glucose-Capillary: 144 mg/dL — ABNORMAL HIGH (ref 70–99)
Glucose-Capillary: 167 mg/dL — ABNORMAL HIGH (ref 70–99)
Glucose-Capillary: 152 mg/dL — ABNORMAL HIGH (ref 70–99)

## 2022-09-09 LAB — RENAL FUNCTION PANEL
Albumin: 2.7 g/dL — ABNORMAL LOW (ref 3.5–5.0)
Anion gap: 9 (ref 5–15)
BUN: 73 mg/dL — ABNORMAL HIGH (ref 8–23)
CO2: 18 mmol/L — ABNORMAL LOW (ref 22–32)
Calcium: 8 mg/dL — ABNORMAL LOW (ref 8.9–10.3)
Chloride: 102 mmol/L (ref 98–111)
Creatinine, Ser: 3.71 mg/dL — ABNORMAL HIGH (ref 0.44–1.00)
GFR, Estimated: 13 mL/min — ABNORMAL LOW (ref >60)
Glucose, Bld: 136 mg/dL — ABNORMAL HIGH (ref 70–99)
Phosphorus: 5 mg/dL — ABNORMAL HIGH (ref 2.5–4.6)
Potassium: 4.1 mmol/L (ref 3.5–5.1)
Sodium: 129 mmol/L — ABNORMAL LOW (ref 135–145)

## 2022-09-09 LAB — MAGNESIUM: Magnesium: 2.5 mg/dL — ABNORMAL HIGH (ref 1.7–2.4)

## 2022-09-09 LAB — CYTOLOGY - NON PAP

## 2022-09-09 MED ORDER — ALBUMIN HUMAN 25 % IV SOLN
25.0000 g | Freq: Once | INTRAVENOUS | Status: AC
  Administered 2022-09-09: 25 g via INTRAVENOUS
  Filled 2022-09-09: qty 100

## 2022-09-09 MED ORDER — SODIUM BICARBONATE 650 MG PO TABS
650.0000 mg | ORAL_TABLET | Freq: Two times a day (BID) | ORAL | Status: DC
  Administered 2022-09-09 – 2022-09-10 (×3): 650 mg via ORAL
  Filled 2022-09-09 (×3): qty 1

## 2022-09-09 MED ORDER — FUROSEMIDE 10 MG/ML IJ SOLN
40.0000 mg | Freq: Once | INTRAMUSCULAR | Status: AC
  Administered 2022-09-09: 40 mg via INTRAVENOUS
  Filled 2022-09-09: qty 4

## 2022-09-09 NOTE — Progress Notes (Signed)
Patient ID: [name], female   DOB: 15-Apr-1950, 72 y.o.   MRN: [id number] S: Feels weak this morning O:BP (!) 111/53 (BP Location: Left Arm)   Pulse (!) 48   Temp 97.9 F (36.6 C)   Resp 17   Ht 5\' 6"  (1.676 m)   Wt 96.1 kg   SpO2 99%   BMI 34.20 kg/m   Intake/Output Summary (Last 24 hours) at 09/09/2022 1241 Last data filed at 09/09/2022 0407 Gross per 24 hour  Intake 560.77 ml  Output 100 ml  Net 460.77 ml   Intake/Output: I/O last 3 completed shifts: In: 1000.8 [P.O.:920; IV Piggyback:80.8] Out: 350 [Urine:350]  Intake/Output this shift:  No intake/output data recorded. Weight change: 3.113 kg Gen: NAD CVS: Bradycardic at 48 Resp: CTA Abd: +Bs, soft, NT/ND, + fluid wave Ext:1+ pretibial edema  Recent Labs  Lab 09/03/22 1625 09/04/22 0452 09/05/22 0421 09/06/22 0403 09/07/22 0553 09/08/22 0440 09/09/22 0410  NA 131* 132* 132* 131* 129* 130* 129*  K 3.8 3.7 3.7 3.9 4.1 4.0 4.1  CL 104 104 105 104 102 102 102  CO2 19* 18* 19* 18* 20* 18* 18*  GLUCOSE 143* 111* 122* 118* 149* 147* 136*  BUN 61* 65* 64* 65* 68* 69* 73*  CREATININE 3.87* 3.92* 3.92* 3.95* 4.09* 3.95* 3.71*  ALBUMIN 2.3* 2.1* 2.9* 2.6* 2.9* 2.7* 2.7*  CALCIUM 7.8* 7.7* 7.9* 7.8* 8.0* 8.1* 8.0*  PHOS  --  5.0*  --  5.0* 5.1* 5.0* 5.0*  AST 34 30 25  --   --   --   --   ALT 24 20 17   --   --   --   --    Liver Function Tests: Recent Labs  Lab 09/03/22 1625 09/04/22 0452 09/05/22 0421 09/06/22 0403 09/07/22 0553 09/08/22 0440 09/09/22 0410  AST 34 30 25  --   --   --   --   ALT 24 20 17   --   --   --   --   ALKPHOS 89 79 65  --   --   --   --   BILITOT 1.3* 1.4* 1.6*  --   --   --   --   PROT 5.1* 4.6* 4.9*  --   --   --   --   ALBUMIN 2.3* 2.1* 2.9*   < > 2.9* 2.7* 2.7*   < > = values in this interval not displayed.   No results for input(s): "LIPASE", "AMYLASE" in the last 168 hours. No results for input(s): "AMMONIA" in the last 168 hours. CBC: Recent Labs  Lab 09/03/22 1625  09/04/22 0452 09/05/22 0421 09/06/22 0403 09/07/22 0553 09/08/22 0440 09/09/22 0410  WBC 7.1   < > 3.8* 4.2 6.6 4.0 3.9*  NEUTROABS 4.9  --   --   --   --   --   --   HGB 10.2*   < > 8.1* 8.2* 9.6* 8.9* 8.5*  HCT 30.6*   < > 24.3* 24.8* 27.9* 26.5* 25.1*  MCV 98.1   < > 99.2 99.6 99.3 99.6 98.8  PLT 54*   < > 30* 34* 52* 36* 35*   < > = values in this interval not displayed.   Cardiac Enzymes: No results for input(s): "CKTOTAL", "CKMB", "CKMBINDEX", "TROPONINI" in the last 168 hours. CBG: Recent Labs  Lab 09/08/22 1646 09/08/22 2031 09/09/22 0756 09/09/22 0829 09/09/22 1204  GLUCAP 165* 152* 133* 128* 144*    Iron  Studies:  Recent Labs    09/08/22 0440  IRON 56  TIBC 154*   Studies/Results: US Paracentesis  Result Date: 09/07/2022 INDICATION: Ascites EXAM: ULTRASOUND GUIDED LEFT LOWER QUADRANT PARACENTESIS MEDICATIONS: None. COMPLICATIONS: None immediate. PROCEDURE: Informed written consent was obtained from the patient after a discussion of the risks, benefits and alternatives to treatment. A timeout was performed prior to the initiation of the procedure. Initial ultrasound scanning demonstrates a large amount of ascites within the left lower abdominal quadrant. The left lower abdomen was prepped and draped in the usual sterile fashion. 1% lidocaine was used for local anesthesia. Following this, a Yueh catheter was introduced. An ultrasound image was saved for documentation purposes. The paracentesis was performed. The catheter was removed and a dressing was applied. The patient tolerated the procedure well without immediate post procedural complication. FINDINGS: A total of approximately 4 L of straw-colored fluid was removed. Samples were sent to the laboratory as requested by the clinical team. IMPRESSION: 1. Successful ultrasound-guided paracentesis yielding 4 liters of peritoneal fluid. Electronically Signed   By: Gaylyn Rong M.D.   On: 09/07/2022 15:20    feeding  supplement (GLUCERNA SHAKE)  237 mL Oral TID BM   insulin aspart  0-5 Units Subcutaneous QHS   insulin aspart  0-9 Units Subcutaneous TID WC   levothyroxine  125 mcg Oral Q0600   midodrine  5 mg Oral TID WC   nystatin  5 mL Oral QID    BMET    Component Value Date/Time   NA 129 (L) 09/09/2022 0410   K 4.1 09/09/2022 0410   CL 102 09/09/2022 0410   CO2 18 (L) 09/09/2022 0410   GLUCOSE 136 (H) 09/09/2022 0410   BUN 73 (H) 09/09/2022 0410   CREATININE 3.71 (H) 09/09/2022 0410   CALCIUM 8.0 (L) 09/09/2022 0410   GFRNONAA 13 (L) 09/09/2022 0410   GFRAA >60 08/11/2019 1738   CBC    Component Value Date/Time   WBC 3.9 (L) 09/09/2022 0410   RBC 2.54 (L) 09/09/2022 0410   HGB 8.5 (L) 09/09/2022 0410   HCT 25.1 (L) 09/09/2022 0410   PLT 35 (L) 09/09/2022 0410   MCV 98.8 09/09/2022 0410   MCH 33.5 09/09/2022 0410   MCHC 33.9 09/09/2022 0410   RDW 15.9 (H) 09/09/2022 0410   LYMPHSABS 1.2 09/03/2022 1625   MONOABS 0.5 09/03/2022 1625   EOSABS 0.5 09/03/2022 1625   BASOSABS 0.0 09/03/2022 1625    Assessment/Plan:   AKI/CKD stage IIIb - (is followed by Dr. Allena Katz in our office and last Scr was 1.35)  pt with history of bladder prolapse and intermittent ureteral obstruction, recent admission with AKI due to volume depletion and Bactrim use, now with worsening anasarca and ascites.  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  She was started on midodrine and albumin IV without significant improvement.  No uremic symptoms or indication for dialysis at this time.  Will continue to follow closely.  Lasix on hold and was given IV albumin during paracentesis 09/07/22 with some increased UOP.  Will dose IV albumin again today as well as IV lasix 40 mg x 1 and follow.   NASH cirrhosis - hold nadalol.  Albumin and midodrine, paracentesis 4L yesterday. Bladder prolapse - urology evaluated pt and no evidence of ureteral  obstruction at this time. Anemia of CKD stage IIIb - continue to follow Hgb and iron stores.  Transfuse for Hgb <7  Moderate protein malnutrition - in setting of NASH cirrhosis. HTN - bp meds on hold due to soft Bp DM type 2 - per primary.  Irena Cords, MD [facility]

## 2022-09-09 NOTE — Progress Notes (Signed)
PROGRESS NOTE   [name]  ZOX:[id number] DOB: 07/17/1950 DOA: 09/03/2022 PCP: Benita Stabile, MD   Chief Complaint  Patient presents with   foley catheter not draining   Level of care: Telemetry  Brief Admission History:   72 y.o. female with medical history significant of NASH liver cirrhosis, T2DM, essential hypertension, hypothyroidism, chronic kidney disease stage III who presents to the emergency department from [facility] in [place] due to lower extremity edema and non-draining Foley.  She was accompanied by son at bedside. Patient was recently seen by her urologist due to chronic vaginal/bladder prolapse and a pessary was placed, a small laceration was noted on the prolapsed bladder, this was repaired and a Foley catheter was placed to allow the bladder to rest while healing.  The Foley drained partially on that day, but patient returned to the urologist and the Foley was readjusted, unfortunately, it was still not draining well.  She was seen in the emergency department on 5/23 due to UTI whereby, IV Rocephin was given in the ED.  Foley catheter was changed due to urine draining around the catheter.  Apparently, patient has been prescribed with Bactrim by her gynecologist, she was advised to continue with the medication, she was discharged home from the ED.  She complained of increased leg swelling and increased abdominal girth which she thought was due to increased fluid due to ascites/cirrhosis.  Patient also complained of shortness of breath within the last 2 days.  She went to see her endocrinologist today and she was sent to the ED for diuresis and Foley catheter replacement.  Patient was recently admitted from 5/28 to 5/31 due to acute kidney injury superimposed on chronic kidney disease   ED Course:  In the emergency department, HR was 55, BP 111/56 and other vital signs are within normal range.  Workup in the ED showed WBC 7.1, hemoglobin 10.2, hematocrit 30.6, MCV 98.1, platelets 54  BMP showed sodium 131, potassium 3.8, chloride 104, bicarb 19, glucose 143, BUN 61, creatinine 3.87, albumin 2.3, BNP 274, magnesium 2.4 She was treated with IV Lasix 40 mg x 1.  Hospitalist was asked to admit patient for further evaluation and management.   Assessment and Plan:  1)Acute kidney injury superimposed on chronic kidney disease stage 3b  Pt remains oliguric and on CT scan bladder empty Nephrologist consulted and making recommendations -Patient's renal function worsening back in May 2024 after Bactrim use Renally adjust medications, avoid nephrotoxic agents/dehydration/hypotension -09/09/22 -Oliguria persists -Nephrology recommends IV albumin and IV Lasix  2)Liver cirrhosis with moderate ascites  -Status post paracentesis on 09/07/2022- --nephrologist recommends avoiding taking more than 4 L of fluid during paracentesis and given albumin with each episode of paracentesis -Suspect that patient is developing hepatorenal syndrome Iv Lasix and albumin as advised by nephrologist above  3)History of bladder prolapse and intermittent ureteral obstruction-- -pessary in place - discussed with Dr. Ronne Binning   4)Hypoalbuminemia possibly due to moderate protein calorie malnutrition Albumin 2.3, protein supplement will be provided Additional IV albumin ordered by nephrology 6/10, 6/11 and 09/09/22 --Echocardiogram reassuring, LVEF 60-65% no RWMAs---no evidence of significant heart failure - 5)Thrombocytopenia possibly due to NASH cirrhosis of the liver -Platelets continue to trend down,, no bleeding noted,    Hypertension with renal disease BP meds will be held at this time due to soft BP -Continue midodrine for pressure support   Type 2 diabetes mellitus with diabetic chronic kidney disease Hemoglobin A1c was 6.0 on 08/17/2022 Continue ISS and  hypoglycemic protocol   Acquired hypothyroidism Continue Synthroid   Social/Ethics--- discussed with patient's daughter Cala Bradford, and  patient's son [name] -Patient remains a full code  DVT prophylaxis: SCDs Code Status: Full  Family Communication: Son and daughter at bedside  disposition: Return to [facility] eating for SNF rehab Status is: Inpatient Remains inpatient appropriate because: IV treatments required, urology, nephrology consultation   Consultants:  Nephrology and urology Procedures:  Paracentesis Antimicrobials:    Subjective: -Patient's daughter Cala Bradford at bedside Grandkids at bedside -Reports fatigue and generalized weakness -Urine output is not great  Objective: Vitals:   09/09/22 0401 09/09/22 0402 09/09/22 1114 09/09/22 1702  BP:  (!) 117/43 (!) 111/53 (!) 146/55  Pulse:  60 (!) 48 (!) 58  Resp:  18 17 18   Temp:  97.9 F (36.6 C)  97.7 F (36.5 C)  TempSrc:    Oral  SpO2:  97% 99% 98%  Weight: 96.1 kg     Height:        Intake/Output Summary (Last 24 hours) at 09/09/2022 1855 Last data filed at 09/09/2022 1200 Gross per 24 hour  Intake 231.26 ml  Output 100 ml  Net 131.26 ml   Filed Weights   09/07/22 0500 09/08/22 0601 09/09/22 0401  Weight: 96.8 kg 93 kg 96.1 kg   Physical Exam Gen:- Awake Alert, in no acute distress  HEENT:- [place].AT, No sclera icterus Neck-Supple Neck,No JVD,.  Lungs-diminished breath sounds, no wheezing CV- S1, S2 normal, RRR Abd-  +ve B.Sounds, Abd Soft, No tenderness,    Extremity/Skin:- No  edema,   good pedal pulses  Psych-affect is appropriate, oriented x3 Neuro-generalized weakness, no new focal deficits, no tremors   Data Reviewed: I have personally reviewed following labs and imaging studies  CBC: Recent Labs  Lab 09/03/22 1625 09/04/22 0452 09/05/22 0421 09/06/22 0403 09/07/22 0553 09/08/22 0440 09/09/22 0410  WBC 7.1   < > 3.8* 4.2 6.6 4.0 3.9*  NEUTROABS 4.9  --   --   --   --   --   --   HGB 10.2*   < > 8.1* 8.2* 9.6* 8.9* 8.5*  HCT 30.6*   < > 24.3* 24.8* 27.9* 26.5* 25.1*  MCV 98.1   < > 99.2 99.6 99.3 99.6 98.8  PLT 54*    < > 30* 34* 52* 36* 35*   < > = values in this interval not displayed.    Basic Metabolic Panel: Recent Labs  Lab 09/03/22 1625 09/04/22 0452 09/05/22 0421 09/06/22 0403 09/07/22 0553 09/08/22 0440 09/09/22 0410  NA 131* 132* 132* 131* 129* 130* 129*  K 3.8 3.7 3.7 3.9 4.1 4.0 4.1  CL 104 104 105 104 102 102 102  CO2 19* 18* 19* 18* 20* 18* 18*  GLUCOSE 143* 111* 122* 118* 149* 147* 136*  BUN 61* 65* 64* 65* 68* 69* 73*  CREATININE 3.87* 3.92* 3.92* 3.95* 4.09* 3.95* 3.71*  CALCIUM 7.8* 7.7* 7.9* 7.8* 8.0* 8.1* 8.0*  MG 2.4 2.4 2.4  --   --   --  2.5*  PHOS  --  5.0*  --  5.0* 5.1* 5.0* 5.0*    CBG: Recent Labs  Lab 09/08/22 2031 09/09/22 0756 09/09/22 0829 09/09/22 1204 09/09/22 1641  GLUCAP 152* 133* 128* 144* 167*    Recent Results (from the past 240 hour(s))  Urine Culture     Status: Abnormal   Collection Time: 09/04/22  4:22 AM   Specimen: Urine, Random  Result Value Ref Range  Status   Specimen Description   Final    URINE, RANDOM Performed at [facility], [address] [place], [place] [address]    Special Requests   Final    NONE Reflexed from [id number] Performed at [facility], [address] [place], [place] [address]    Culture (A)  Final    20,000 COLONIES/mL ENTEROCOCCUS FAECALIS 30,000 COLONIES/mL YEAST    Report Status 09/07/2022 FINAL  Final   Organism ID, Bacteria ENTEROCOCCUS FAECALIS (A)  Final      Susceptibility   Enterococcus faecalis - MIC*    AMPICILLIN <=2 SENSITIVE Sensitive     NITROFURANTOIN <=16 SENSITIVE Sensitive     VANCOMYCIN 1 SENSITIVE Sensitive     * 20,000 COLONIES/mL ENTEROCOCCUS FAECALIS  Culture, body fluid w Gram Stain-bottle     Status: None (Preliminary result)   Collection Time: 09/07/22  2:00 PM   Specimen: Ascitic  Result Value Ref Range Status   Specimen Description ASCITIC  Final   Special Requests BAA 10CC  Final   Culture   Final    NO GROWTH 2 DAYS Performed at [facility], [address],  [place], [place] [address]    Report Status PENDING  Incomplete  Gram stain     Status: None   Collection Time: 09/07/22  2:00 PM   Specimen: Ascitic  Result Value Ref Range Status   Specimen Description ASCITIC  Final   Special Requests NONE  Final   Gram Stain   Final    WBC PRESENT,BOTH PMN AND MONONUCLEAR NO ORGANISMS SEEN CYTOSPIN SMEAR Performed at [facility], [address] [place], [place] [address]    Report Status 09/07/2022 FINAL  Final    Radiology Studies: No results found.  Scheduled Meds:  feeding supplement (GLUCERNA SHAKE)  237 mL Oral TID BM   insulin aspart  0-5 Units Subcutaneous QHS   insulin aspart  0-9 Units Subcutaneous TID WC   levothyroxine  125 mcg Oral Q0600   midodrine  5 mg Oral TID WC   sodium bicarbonate  650 mg Oral BID   Continuous Infusions:   LOS: 5 days   Shon Hale, MD How to contact the [facility] Attending or Consulting provider 7A - 7P or covering provider during after hours 7P -7A, for this patient?  Check the care team in [facility] and look for a) attending/consulting TR[facility] provider listed and b) the [facility] team listed Log into [email or web address] and use [facility] universal password to access. If you do not have the password, please contact the hospital operator. Locate the [facility] provider you are looking for under [facility] and page to a number that you can be directly reached. If you still have difficulty reaching the provider, please page the [facility] (Director on Call) for the Hospitalists listed on amion for assistance.  09/09/2022, 6:55 PM

## 2022-09-09 NOTE — Progress Notes (Signed)
Mobility Specialist Progress Note:    09/09/22 1000  Mobility  Activity Ambulated with assistance in hallway  Level of Assistance Standby assist, set-up cues, supervision of patient - no hands on  Assistive Device Front wheel walker  Distance Ambulated (ft) 200 ft  Range of Motion/Exercises Active;All extremities  Activity Response Tolerated well  Mobility Referral Yes  $Mobility charge 1 Mobility  Mobility Specialist Start Time (ACUTE ONLY) 1000  Mobility Specialist Stop Time (ACUTE ONLY) 1015  Mobility Specialist Time Calculation (min) (ACUTE ONLY) 15 min   Pt agreeable to mobility session. Ambulated in hallway with RW, SBA for safety. Tolerated well, asx throughout. Returned pt to chair in room, all needs met.   Feliciana Rossetti Mobility Specialist Please contact via Special educational needs teacher or  Rehab office at [phone]

## 2022-09-10 ENCOUNTER — Encounter (HOSPITAL_COMMUNITY): Payer: Self-pay | Admitting: Family Medicine

## 2022-09-10 DIAGNOSIS — Z885 Allergy status to narcotic agent status: Secondary | ICD-10-CM | POA: Diagnosis not present

## 2022-09-10 DIAGNOSIS — Z515 Encounter for palliative care: Secondary | ICD-10-CM

## 2022-09-10 DIAGNOSIS — N179 Acute kidney failure, unspecified: Secondary | ICD-10-CM | POA: Diagnosis not present

## 2022-09-10 DIAGNOSIS — N139 Obstructive and reflux uropathy, unspecified: Secondary | ICD-10-CM | POA: Diagnosis not present

## 2022-09-10 DIAGNOSIS — N398 Other specified disorders of urinary system: Secondary | ICD-10-CM | POA: Diagnosis not present

## 2022-09-10 DIAGNOSIS — N1832 Chronic kidney disease, stage 3b: Secondary | ICD-10-CM | POA: Diagnosis not present

## 2022-09-10 DIAGNOSIS — H35033 Hypertensive retinopathy, bilateral: Secondary | ICD-10-CM | POA: Diagnosis not present

## 2022-09-10 DIAGNOSIS — Z743 Need for continuous supervision: Secondary | ICD-10-CM | POA: Diagnosis not present

## 2022-09-10 DIAGNOSIS — Q794 Prune belly syndrome: Secondary | ICD-10-CM | POA: Diagnosis not present

## 2022-09-10 DIAGNOSIS — Z4689 Encounter for fitting and adjustment of other specified devices: Secondary | ICD-10-CM | POA: Diagnosis not present

## 2022-09-10 DIAGNOSIS — E039 Hypothyroidism, unspecified: Secondary | ICD-10-CM | POA: Diagnosis not present

## 2022-09-10 DIAGNOSIS — M79604 Pain in right leg: Secondary | ICD-10-CM | POA: Diagnosis not present

## 2022-09-10 DIAGNOSIS — R2681 Unsteadiness on feet: Secondary | ICD-10-CM | POA: Diagnosis not present

## 2022-09-10 DIAGNOSIS — R6889 Other general symptoms and signs: Secondary | ICD-10-CM | POA: Diagnosis not present

## 2022-09-10 DIAGNOSIS — Z960 Presence of urogenital implants: Secondary | ICD-10-CM | POA: Diagnosis not present

## 2022-09-10 DIAGNOSIS — R5381 Other malaise: Secondary | ICD-10-CM | POA: Diagnosis not present

## 2022-09-10 DIAGNOSIS — S82831A Other fracture of upper and lower end of right fibula, initial encounter for closed fracture: Secondary | ICD-10-CM | POA: Diagnosis not present

## 2022-09-10 DIAGNOSIS — M1A9XX1 Chronic gout, unspecified, with tophus (tophi): Secondary | ICD-10-CM | POA: Diagnosis not present

## 2022-09-10 DIAGNOSIS — N1831 Chronic kidney disease, stage 3a: Secondary | ICD-10-CM | POA: Diagnosis not present

## 2022-09-10 DIAGNOSIS — Z7189 Other specified counseling: Secondary | ICD-10-CM

## 2022-09-10 DIAGNOSIS — Z882 Allergy status to sulfonamides status: Secondary | ICD-10-CM | POA: Diagnosis not present

## 2022-09-10 DIAGNOSIS — E1122 Type 2 diabetes mellitus with diabetic chronic kidney disease: Secondary | ICD-10-CM | POA: Diagnosis not present

## 2022-09-10 DIAGNOSIS — N898 Other specified noninflammatory disorders of vagina: Secondary | ICD-10-CM | POA: Diagnosis not present

## 2022-09-10 DIAGNOSIS — I10 Essential (primary) hypertension: Secondary | ICD-10-CM | POA: Diagnosis not present

## 2022-09-10 DIAGNOSIS — S82401A Unspecified fracture of shaft of right fibula, initial encounter for closed fracture: Secondary | ICD-10-CM | POA: Diagnosis not present

## 2022-09-10 DIAGNOSIS — R9431 Abnormal electrocardiogram [ECG] [EKG]: Secondary | ICD-10-CM | POA: Diagnosis not present

## 2022-09-10 DIAGNOSIS — E119 Type 2 diabetes mellitus without complications: Secondary | ICD-10-CM | POA: Diagnosis not present

## 2022-09-10 DIAGNOSIS — R279 Unspecified lack of coordination: Secondary | ICD-10-CM | POA: Diagnosis not present

## 2022-09-10 DIAGNOSIS — D649 Anemia, unspecified: Secondary | ICD-10-CM | POA: Diagnosis not present

## 2022-09-10 DIAGNOSIS — I129 Hypertensive chronic kidney disease with stage 1 through stage 4 chronic kidney disease, or unspecified chronic kidney disease: Secondary | ICD-10-CM | POA: Diagnosis not present

## 2022-09-10 DIAGNOSIS — W19XXXD Unspecified fall, subsequent encounter: Secondary | ICD-10-CM | POA: Diagnosis not present

## 2022-09-10 DIAGNOSIS — D631 Anemia in chronic kidney disease: Secondary | ICD-10-CM | POA: Diagnosis not present

## 2022-09-10 DIAGNOSIS — M25551 Pain in right hip: Secondary | ICD-10-CM | POA: Diagnosis not present

## 2022-09-10 DIAGNOSIS — R079 Chest pain, unspecified: Secondary | ICD-10-CM | POA: Diagnosis not present

## 2022-09-10 DIAGNOSIS — W1839XA Other fall on same level, initial encounter: Secondary | ICD-10-CM | POA: Diagnosis not present

## 2022-09-10 DIAGNOSIS — Z09 Encounter for follow-up examination after completed treatment for conditions other than malignant neoplasm: Secondary | ICD-10-CM | POA: Diagnosis not present

## 2022-09-10 DIAGNOSIS — E8809 Other disorders of plasma-protein metabolism, not elsewhere classified: Secondary | ICD-10-CM | POA: Diagnosis not present

## 2022-09-10 DIAGNOSIS — K7460 Unspecified cirrhosis of liver: Secondary | ICD-10-CM | POA: Diagnosis not present

## 2022-09-10 DIAGNOSIS — E441 Mild protein-calorie malnutrition: Secondary | ICD-10-CM | POA: Diagnosis not present

## 2022-09-10 DIAGNOSIS — S3720XA Unspecified injury of bladder, initial encounter: Secondary | ICD-10-CM | POA: Diagnosis not present

## 2022-09-10 DIAGNOSIS — R2689 Other abnormalities of gait and mobility: Secondary | ICD-10-CM | POA: Diagnosis not present

## 2022-09-10 DIAGNOSIS — E559 Vitamin D deficiency, unspecified: Secondary | ICD-10-CM | POA: Diagnosis not present

## 2022-09-10 DIAGNOSIS — R1311 Dysphagia, oral phase: Secondary | ICD-10-CM | POA: Diagnosis not present

## 2022-09-10 DIAGNOSIS — K7581 Nonalcoholic steatohepatitis (NASH): Secondary | ICD-10-CM | POA: Diagnosis not present

## 2022-09-10 DIAGNOSIS — T83011A Breakdown (mechanical) of indwelling urethral catheter, initial encounter: Secondary | ICD-10-CM | POA: Diagnosis not present

## 2022-09-10 DIAGNOSIS — N993 Prolapse of vaginal vault after hysterectomy: Secondary | ICD-10-CM | POA: Diagnosis present

## 2022-09-10 DIAGNOSIS — R791 Abnormal coagulation profile: Secondary | ICD-10-CM | POA: Diagnosis not present

## 2022-09-10 DIAGNOSIS — M7989 Other specified soft tissue disorders: Secondary | ICD-10-CM | POA: Diagnosis not present

## 2022-09-10 DIAGNOSIS — N390 Urinary tract infection, site not specified: Secondary | ICD-10-CM | POA: Diagnosis not present

## 2022-09-10 DIAGNOSIS — R1319 Other dysphagia: Secondary | ICD-10-CM | POA: Diagnosis not present

## 2022-09-10 DIAGNOSIS — W19XXXA Unspecified fall, initial encounter: Secondary | ICD-10-CM | POA: Diagnosis not present

## 2022-09-10 DIAGNOSIS — R188 Other ascites: Secondary | ICD-10-CM | POA: Diagnosis not present

## 2022-09-10 DIAGNOSIS — N8110 Cystocele, unspecified: Secondary | ICD-10-CM | POA: Diagnosis not present

## 2022-09-10 DIAGNOSIS — M6281 Muscle weakness (generalized): Secondary | ICD-10-CM | POA: Diagnosis not present

## 2022-09-10 DIAGNOSIS — E113599 Type 2 diabetes mellitus with proliferative diabetic retinopathy without macular edema, unspecified eye: Secondary | ICD-10-CM | POA: Diagnosis not present

## 2022-09-10 DIAGNOSIS — N2889 Other specified disorders of kidney and ureter: Secondary | ICD-10-CM | POA: Diagnosis not present

## 2022-09-10 DIAGNOSIS — N189 Chronic kidney disease, unspecified: Secondary | ICD-10-CM | POA: Diagnosis not present

## 2022-09-10 DIAGNOSIS — D696 Thrombocytopenia, unspecified: Secondary | ICD-10-CM | POA: Diagnosis not present

## 2022-09-10 DIAGNOSIS — S82831D Other fracture of upper and lower end of right fibula, subsequent encounter for closed fracture with routine healing: Secondary | ICD-10-CM | POA: Diagnosis not present

## 2022-09-10 DIAGNOSIS — R339 Retention of urine, unspecified: Secondary | ICD-10-CM | POA: Diagnosis not present

## 2022-09-10 LAB — CBC
HCT: 26.1 % — ABNORMAL LOW (ref 36.0–46.0)
Hemoglobin: 8.8 g/dL — ABNORMAL LOW (ref 12.0–15.0)
MCH: 33.3 pg (ref 26.0–34.0)
MCHC: 33.7 g/dL (ref 30.0–36.0)
MCV: 98.9 fL (ref 80.0–100.0)
Platelets: 30 10*3/uL — ABNORMAL LOW (ref 150–400)
RBC: 2.64 MIL/uL — ABNORMAL LOW (ref 3.87–5.11)
RDW: 15.7 % — ABNORMAL HIGH (ref 11.5–15.5)
WBC: 3.5 10*3/uL — ABNORMAL LOW (ref 4.0–10.5)
nRBC: 0 % (ref 0.0–0.2)

## 2022-09-10 LAB — RENAL FUNCTION PANEL
Albumin: 3.1 g/dL — ABNORMAL LOW (ref 3.5–5.0)
Anion gap: 9 (ref 5–15)
BUN: 72 mg/dL — ABNORMAL HIGH (ref 8–23)
CO2: 18 mmol/L — ABNORMAL LOW (ref 22–32)
Calcium: 8.3 mg/dL — ABNORMAL LOW (ref 8.9–10.3)
Chloride: 103 mmol/L (ref 98–111)
Creatinine, Ser: 3.69 mg/dL — ABNORMAL HIGH (ref 0.44–1.00)
GFR, Estimated: 13 mL/min — ABNORMAL LOW (ref >60)
Glucose, Bld: 124 mg/dL — ABNORMAL HIGH (ref 70–99)
Phosphorus: 5 mg/dL — ABNORMAL HIGH (ref 2.5–4.6)
Potassium: 4 mmol/L (ref 3.5–5.1)
Sodium: 130 mmol/L — ABNORMAL LOW (ref 135–145)

## 2022-09-10 LAB — GLUCOSE, CAPILLARY
Glucose-Capillary: 131 mg/dL — ABNORMAL HIGH (ref 70–99)
Glucose-Capillary: 159 mg/dL — ABNORMAL HIGH (ref 70–99)
Glucose-Capillary: 151 mg/dL — ABNORMAL HIGH (ref 70–99)

## 2022-09-10 MED ORDER — MIDODRINE HCL 10 MG PO TABS: 10.0000 mg | ORAL_TABLET | Freq: Three times a day (TID) | ORAL | 3 refills | Status: DC

## 2022-09-10 MED ORDER — ACETAMINOPHEN 325 MG PO TABS: 650.0000 mg | ORAL_TABLET | Freq: Four times a day (QID) | ORAL | 0 refills | Status: DC | PRN

## 2022-09-10 MED ORDER — SODIUM BICARBONATE 650 MG PO TABS: 1300.0000 mg | ORAL_TABLET | Freq: Two times a day (BID) | ORAL | 4 refills | Status: DC

## 2022-09-10 MED ORDER — MIDODRINE HCL 5 MG PO TABS
10.0000 mg | ORAL_TABLET | Freq: Three times a day (TID) | ORAL | Status: DC
  Administered 2022-09-10 (×2): 10 mg via ORAL
  Filled 2022-09-10 (×2): qty 2

## 2022-09-10 MED ORDER — PROPRANOLOL HCL 40 MG PO TABS: 20.0000 mg | ORAL_TABLET | Freq: Every day | ORAL | 2 refills | Status: DC

## 2022-09-10 NOTE — Progress Notes (Signed)
[facility] transport to pick up patient. Patient walked out by nurse tech. Cell phone and charger sent with patient. Family notified.

## 2022-09-10 NOTE — Discharge Instructions (Addendum)
1)Repeat CBC, CMP, PT/INR----every Tuesday for next 4 weeks starting 09/15/22 2)Avoid ibuprofen/Advil/Aleve/Motrin/Goody Powders/Naproxen/BC powders/Meloxicam/Diclofenac/Indomethacin and other Nonsteroidal anti-inflammatory medications as these will make you more likely to bleed and can cause stomach ulcers, can also cause Kidney problems.  3)Consider outpatient Palliative Care follow up with [facility] services 4)Nephrologist recommends avoiding taking more than 4 L of fluid during paracentesis and giving iv  albumin with each episode of paracentesis----- 5)

## 2022-09-10 NOTE — Care Management Important Message (Signed)
Important Message  Patient Details  Name: [name] MRN: [id number] Date of Birth: 07-28-1950   Medicare Important Message Given:  Yes     Corey Harold 09/10/2022, 3:30 PM

## 2022-09-10 NOTE — Consult Note (Signed)
Consultation Note Date: 09/10/2022   Patient Name: [name]  DOB: 1950/08/20  MRN: [id number]  Age / Sex: 72 y.o., female  PCP: Benita Stabile, MD Referring Physician: Shon Hale, MD  Reason for Consultation: Establishing goals of care  HPI/Patient Profile: 72 y.o. female  with past medical history of [name] liver cirrhosis, DM2, HTN, CKD 3, hypothyroid, chronic vaginal/bladder prolapse with pessary, recently admitted and then discharged to short-term rehab admitted on 09/03/2022 with acute kidney injury on CKD 3, followed by nephrologist, urologist.   Clinical Assessment and Goals of Care: I have reviewed medical records including EPIC notes, labs and imaging, received report from RN, assessed the patient.  [name] is lying quietly in bed.  She appears acutely/chronically ill and quite frail.  She is resting comfortably, but wakes easily when I enter.  She greets me, making and mostly keeping eye contact.  She is alert and oriented x 3, able to make her basic needs known.  There is no family at bedside at this time.  We meet at the bedside to discuss diagnosis prognosis, GOC, EOL wishes, disposition and options.  I introduced Palliative Medicine as specialized medical care for people living with serious illness. It focuses on providing relief from the symptoms and stress of a serious illness. The goal is to improve quality of life for both the patient and the family.  We discussed a brief life review of the patient.  [name] tells me that until her most recent hospitalization she had been living independently.  Independent with ADLs/IADLs.  She has 2 sons, [name] and [name] and a daughter [name].  It seems that she has quite supportive family.  We then focused on their current illness.  Overall, [name] seems quite knowledgeable about her acute and chronic health concerns.  She tells me that she had  issues with her pessary, saw her urologist and was treated for UTI.  She tells me that the Bactrim "really messed me up".  She tells me that she understands that she will be monitored for the next 24 to 48 hours and anticipates return to short-term rehab.  She tells me that she is agreeable to return to short-term rehab with the ultimate goal of returning home.  The natural disease trajectory and expectations at EOL were discussed.  Advanced directives, concepts specific to code status, artifical feeding and hydration, and rehospitalization were considered and discussed.  We talk about the concept of "treat the treatable, but allowing natural passing.  [name] readily states that she would want DNR.  She shares that she has had these discussions with her family, and her son [name] is to talk with her other children today.  I ask if she would like for me to change her to DNR status during this current hospital stay and she states that she would.  Orders adjusted, goldenrod form completed and placed on chart.  Palliative Care services outpatient were explained and offered.  We talk about the benefits of outpatient palliative services  for further support and discussions.  She is agreeable to outpatient palliative services.  Provider choice offered.  [facility] provider of choice.  Team updated  Discussed the importance of continued conversation with family and the medical providers regarding overall plan of care and treatment options, ensuring decisions are within the context of the patient's values and GOCs.  Questions and concerns were addressed.  The patient was encouraged to call with questions or concerns.  PMT will continue to support holistically.  Conference with attending, bedside nursing staff, transition of care team related to patient condition, needs, goals of care, disposition.    HCPOA NEXT OF KIN -states that her son [name] will be her healthcare surrogate.    SUMMARY OF RECOMMENDATIONS    At this point continue to treat the treatable but no CPR or intubation.  Time for outcomes. Return to [facility] rehab for continued short-term rehab Ultimate goal is to return home Outpatient palliative services through [facility]    Code Status/Advance Care Planning: DNR  Symptom Management:  Per hospitalist, no additional needs at this time.  Palliative Prophylaxis:  Frequent Pain Assessment, Oral Care, and Turn Reposition  Additional Recommendations (Limitations, Scope, Preferences): Continue to treat the treatable but no CPR or intubation  Psycho-social/Spiritual:  Desire for further Chaplaincy support:no Additional Recommendations: Caregiving  Support/Resources  Prognosis:  Unable to determine, based on outcomes.  1 year or less would not be surprising based on chronic illness burden, decreasing functional status, frailty.  Discharge Planning: Skilled Nursing Facility for rehab with Palliative care service follow-up      Primary Diagnoses: Present on Admission:  Acute kidney injury superimposed on chronic kidney disease (HCC)  Thrombocytopenia (HCC)  Prolonged QT interval  Hypertension with renal disease  Type 2 diabetes mellitus with diabetic chronic kidney disease (HCC)  Acquired hypothyroidism  Acute renal failure superimposed on stage 3b chronic kidney disease (HCC)   I have reviewed the medical record, interviewed the patient and family, and examined the patient. The following aspects are pertinent.  Past Medical History:  Diagnosis Date   Alkaline phosphatase elevation    Anemia    ASD (atrial septal defect)    Cataract    both eyes hx of   Chest pain    03-17-2013 last chest pain   Chronic kidney disease    Stage III kidney disease   Cirrhosis (HCC)    Depression    Diabetes mellitus type II    Family history of adverse reaction to anesthesia    Son hard to wake up   Fatigue    GERD (gastroesophageal reflux disease)    Gout     Headache(784.0)    occasional   Hyperlipidemia    Hypertension    Hypothyroidism    Neuropathy    Osteopenia    Peripheral neuropathy    Presence of pessary    Retinopathy    Social History   Socioeconomic History   Marital status: Divorced    Spouse name: Not on file   Number of children: 3   Years of education: 12   Highest education level: Not on file  Occupational History   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 Reports that her son recently moved out after living with her for a long time. She reports that she is happy to be living alone and feels like she was enabling his behavior. Reports that he had a history of drug use.   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    No pets.   Eats all food groups.    Wears seat belt.    Lives in house.    Smoke detectors.    Social Determinants of Health   Financial Resource Strain: Medium Risk (10/30/2019)   Overall Financial Resource Strain (CARDIA)    Difficulty of Paying Living Expenses: Somewhat hard  Food Insecurity: No Food Insecurity (09/03/2022)   Hunger Vital Sign    Worried About Running Out of Food in the Last Year: Never true    Ran Out of Food in the Last Year: Never true  Transportation Needs: No Transportation Needs (09/03/2022)   PRAPARE - Administrator, Civil Service (Medical): No    Lack of Transportation (Non-Medical): No  Physical Activity: Inactive (10/30/2019)   Exercise Vital Sign    Days of Exercise per Week: 0 days    Minutes of Exercise per Session: 0 min  Stress: No  Stress Concern Present (10/30/2019)   [employer] of Occupational Health - Occupational Stress Questionnaire    Feeling of Stress : Not at all  Social Connections: Moderately Integrated (10/30/2019)   Social Connection and Isolation Panel [NHANES]    Frequency of Communication with Friends and Family: More than three times a week    Frequency of Social Gatherings with Friends and Family: Three times a week    Attends Religious Services: More than 4 times per year    Active Member of Clubs or Organizations: Yes    Attends Banker Meetings: 1 to 4 times per year    Marital Status: Divorced   Family History  Problem Relation Age of Onset   COPD Mother    Lung cancer Father    Diabetes Sister    Cataracts Sister    Insulin resistance Daughter    Insulin resistance Son    Scheduled Meds:  feeding supplement (GLUCERNA SHAKE)  237 mL Oral TID BM   insulin aspart  0-5 Units Subcutaneous QHS   insulin aspart  0-9 Units Subcutaneous TID WC   levothyroxine  125 mcg Oral Q0600   midodrine  10 mg Oral TID WC   sodium bicarbonate  650 mg Oral BID   Continuous Infusions: PRN Meds:.acetaminophen **OR** acetaminophen, prochlorperazine Medications Prior to Admission:  Prior to Admission medications   Medication Sig Start Date End Date Taking? Authorizing Provider  allopurinol (ZYLOPRIM) 100 MG tablet Take 100 mg by mouth every evening.    Yes [provider]  cholecalciferol (VITAMIN D3) 25 MCG (1000 UNIT) tablet Take 1,000 Units by mouth once a week. Wednesday weekly   Yes [provider]  insulin regular (NOVOLIN R) 100 units/mL injection Inject 5-8 Units into the skin 3 (three) times daily before meals. Sliding scale 0-250=0 251-300=5 301-350=6 351-400=7units 401-450 8 units   Yes [provider]  nadolol (CORGARD) 40 MG tablet Take 40 mg by mouth daily. 08/19/20  Yes [provider]  neomycin-polymyxin b-dexamethasone (MAXITROL)  3.5-10000-0.1 OINT  Place into the right eye 2 (two) times daily. 06/11/22  Yes [provider]  NOVOLIN N RELION 100 UNIT/ML injection Inject 14 Units into the skin 2 (two) times daily. 09/14/19  Yes [provider]  sodium bicarbonate 650 MG tablet Take 1 tablet (650 mg total) by mouth 2 (two) times daily. 08/28/22 09/27/22 Yes Shah, Pratik D, DO  Vitamin D, Ergocalciferol, (DRISDOL) 1.25 MG (50000 UT) CAPS capsule Take 50,000 Units by mouth every Saturday. 11/16/18  Yes [provider]  levothyroxine (SYNTHROID) 125 MCG tablet 1 tablet in the morning on an empty stomach Orally Once a day for 30 day(s) 09/03/22   [provider]  [facility] ULTRA test strip  12/19/18   [provider]   Allergies  Allergen Reactions   Bactrim [Sulfamethoxazole-Trimethoprim] Nausea And Vomiting and Nausea Only   Ciprofloxacin      severe yeast infection   Crestor [Rosuvastatin]     arthralgia/myalgia   Lipitor [Atorvastatin]     arthralgia/myalgia   Lisinopril Cough   Losartan Potassium Cough   Lovastatin     arthralgia/myalgia   Statins Other (See Comments)    arthralgia/myalgia   Welchol [Colesevelam]     arthralgia/myalgia   Zetia [Ezetimibe]     arthralgia/myalgia   Dilaudid [Hydromorphone Hcl] Nausea And Vomiting    After one vomiting episode no further vomiting   Review of Systems  Unable to perform ROS: Other    Physical Exam Vitals and nursing note reviewed.  Constitutional:      General: She is not in acute distress.    Appearance: She is ill-appearing.  HENT:     Mouth/Throat:     Mouth: Mucous membranes are moist.  Cardiovascular:     Rate and Rhythm: Normal rate.  Pulmonary:     Effort: Pulmonary effort is normal. No respiratory distress.  Skin:    General: Skin is warm and dry.  Neurological:     Mental Status: She is alert and oriented to person, place, and time.  Psychiatric:        Mood and Affect: Mood normal.        Behavior:  Behavior normal.     Vital Signs: BP (!) 119/50 (BP Location: Left Arm)   Pulse 63   Temp 97.9 F (36.6 C) (Oral)   Resp 18   Ht 5\' 6"  (1.676 m)   Wt 92.4 kg   SpO2 98%   BMI 32.88 kg/m  Pain Scale: 0-10   Pain Score: 0-No pain   SpO2: SpO2: 98 % O2 Device:SpO2: 98 % O2 Flow Rate: .   IO: Intake/output summary:  Intake/Output Summary (Last 24 hours) at 09/10/2022 1324 Last data filed at 09/10/2022 4540 Gross per 24 hour  Intake 480 ml  Output 500 ml  Net -20 ml    LBM: Last BM Date : 09/08/22 Baseline Weight: Weight: 91.4 kg Most recent weight: Weight: 92.4 kg     Palliative Assessment/Data:     Time In: 1100 Time Out: 1155 Time Total: 55 minutes  Greater than 50%  of this time was spent counseling and coordinating care related to the above assessment and plan.  Signed by: Katheran Awe, NP   Please contact Palliative Medicine Team phone at [phone] for questions and concerns.  For individual provider: See Loretha Stapler

## 2022-09-10 NOTE — Progress Notes (Signed)
Patient ID: [name], female   DOB: 1951-01-07, 72 y.o.   MRN: [id number] S: patient seen and examined bedside this am. She reports that she has an appetite but has difficulty eating due to dentition, unable to chew. Denies any abd pain, CP, SOB, worsening swelling, decreasing urinary frequency. O:BP (!) 115/51 (BP Location: Right Arm)   Pulse (!) 58   Temp 98.5 F (36.9 C)   Resp 16   Ht 5\' 6"  (1.676 m)   Wt 92.4 kg   SpO2 97%   BMI 32.88 kg/m   Intake/Output Summary (Last 24 hours) at 09/10/2022 0840 Last data filed at 09/10/2022 0616 Gross per 24 hour  Intake 680 ml  Output 500 ml  Net 180 ml   Intake/Output: I/O last 3 completed shifts: In: 711.3 [P.O.:680; IV Piggyback:31.3] Out: 600 [Urine:600]  Intake/Output this shift:  No intake/output data recorded. Weight change: -3.7 kg Gen: NAD, laying flat  in bed CVS: RRR Resp: CTA b/l Abd: +Bs, soft, NT, slightly distended, + fluid wave Ext: very trace edema b/l Les Neuro: awake, alert  Recent Labs  Lab 09/03/22 1625 09/04/22 0452 09/05/22 0421 09/06/22 0403 09/07/22 0553 09/08/22 0440 09/09/22 0410 09/10/22 0429  NA 131* 132* 132* 131* 129* 130* 129* 130*  K 3.8 3.7 3.7 3.9 4.1 4.0 4.1 4.0  CL 104 104 105 104 102 102 102 103  CO2 19* 18* 19* 18* 20* 18* 18* 18*  GLUCOSE 143* 111* 122* 118* 149* 147* 136* 124*  BUN 61* 65* 64* 65* 68* 69* 73* 72*  CREATININE 3.87* 3.92* 3.92* 3.95* 4.09* 3.95* 3.71* 3.69*  ALBUMIN 2.3* 2.1* 2.9* 2.6* 2.9* 2.7* 2.7* 3.1*  CALCIUM 7.8* 7.7* 7.9* 7.8* 8.0* 8.1* 8.0* 8.3*  PHOS  --  5.0*  --  5.0* 5.1* 5.0* 5.0* 5.0*  AST 34 30 25  --   --   --   --   --   ALT 24 20 17   --   --   --   --   --    Liver Function Tests: Recent Labs  Lab 09/03/22 1625 09/04/22 0452 09/05/22 0421 09/06/22 0403 09/08/22 0440 09/09/22 0410 09/10/22 0429  AST 34 30 25  --   --   --   --   ALT 24 20 17   --   --   --   --   ALKPHOS 89 79 65  --   --   --   --   BILITOT 1.3* 1.4* 1.6*  --   --    --   --   PROT 5.1* 4.6* 4.9*  --   --   --   --   ALBUMIN 2.3* 2.1* 2.9*   < > 2.7* 2.7* 3.1*   < > = values in this interval not displayed.   No results for input(s): "LIPASE", "AMYLASE" in the last 168 hours. No results for input(s): "AMMONIA" in the last 168 hours. CBC: Recent Labs  Lab 09/03/22 1625 09/04/22 0452 09/06/22 0403 09/07/22 0553 09/08/22 0440 09/09/22 0410 09/10/22 0429  WBC 7.1   < > 4.2 6.6 4.0 3.9* 3.5*  NEUTROABS 4.9  --   --   --   --   --   --   HGB 10.2*   < > 8.2* 9.6* 8.9* 8.5* 8.8*  HCT 30.6*   < > 24.8* 27.9* 26.5* 25.1* 26.1*  MCV 98.1   < > 99.6 99.3 99.6 98.8 98.9  PLT 54*   < >  34* 52* 36* 35* 30*   < > = values in this interval not displayed.   Cardiac Enzymes: No results for input(s): "CKTOTAL", "CKMB", "CKMBINDEX", "TROPONINI" in the last 168 hours. CBG: Recent Labs  Lab 09/09/22 0829 09/09/22 1204 09/09/22 1641 09/09/22 2128 09/10/22 0734  GLUCAP 128* 144* 167* 152* 131*    Iron Studies:  Recent Labs    09/08/22 0440  IRON 56  TIBC 154*   Studies/Results: No results found.  feeding supplement (GLUCERNA SHAKE)  237 mL Oral TID BM   insulin aspart  0-5 Units Subcutaneous QHS   insulin aspart  0-9 Units Subcutaneous TID WC   levothyroxine  125 mcg Oral Q0600   midodrine  5 mg Oral TID WC   sodium bicarbonate  650 mg Oral BID    BMET    Component Value Date/Time   NA 130 (L) 09/10/2022 0429   K 4.0 09/10/2022 0429   CL 103 09/10/2022 0429   CO2 18 (L) 09/10/2022 0429   GLUCOSE 124 (H) 09/10/2022 0429   BUN 72 (H) 09/10/2022 0429   CREATININE 3.69 (H) 09/10/2022 0429   CALCIUM 8.3 (L) 09/10/2022 0429   GFRNONAA 13 (L) 09/10/2022 0429   GFRAA >60 08/11/2019 1738   CBC    Component Value Date/Time   WBC 3.5 (L) 09/10/2022 0429   RBC 2.64 (L) 09/10/2022 0429   HGB 8.8 (L) 09/10/2022 0429   HCT 26.1 (L) 09/10/2022 0429   PLT 30 (L) 09/10/2022 0429   MCV 98.9 09/10/2022 0429   MCH 33.3 09/10/2022 0429   MCHC 33.7  09/10/2022 0429   RDW 15.7 (H) 09/10/2022 0429   LYMPHSABS 1.2 09/03/2022 1625   MONOABS 0.5 09/03/2022 1625   EOSABS 0.5 09/03/2022 1625   BASOSABS 0.0 09/03/2022 1625    Assessment/Plan:   AKI/CKD stage IIIb - (is followed by Dr. Allena Katz in our office and last Scr was 1.35)  pt with history of bladder prolapse and intermittent ureteral obstruction, recent admission with AKI due to volume depletion and Bactrim use, now with worsening anasarca and ascites.  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  She was started on midodrine and albumin IV without significant improvement.  No uremic symptoms or indication for dialysis at this time.  Will continue to follow closely.  Lasix on hold and was given IV albumin during paracentesis 09/07/22 with some increased UOP.  Cr stable today at 3.69, will hold on lasix today given concern for PO intake. MAPs are borderline, will increase her midodrine to 10mg  TID. Has gotten adequate amounts of albumin  but may need to revisit this based on trajectory of labs NASH cirrhosis - hold nadalol.  S/p Albumin and on midodrine (increasing to 10mg  TID), paracentesis 4L 6/11. Would recommend giving albumin with paracentesis Bladder prolapse - urology evaluated pt and no evidence of ureteral obstruction at this time. Anemia of CKD stage IIIb - continue to follow Hgb and iron stores.  Transfuse for Hgb <7   Moderate protein malnutrition - in setting of NASH cirrhosis. HTN - bp meds on hold due to soft Bp--increasing midodrine as above DM type 2 - per primary. Hyponatremia- in the setting of AKI and cirrhosis. Na stable 130 today Thrombocytopenia- in the setting of cirrhosis, per primary service Metabolic acidosis- on nahco3 650mg  BID, monitor for now  Anthony Sar, MD [facility]

## 2022-09-10 NOTE — Discharge Summary (Addendum)
[name], is a 72 y.o. female  DOB [birth year]  MRN [id number].  Admission date:  09/03/2022  Admitting Physician  Cleora Fleet, MD  Discharge Date:  09/10/2022   Primary MD  Benita Stabile, MD  Recommendations for primary care physician for things to follow:   1)Repeat CBC, CMP, PT/INR----every Tuesday for next 4 weeks starting 09/15/22 2)Avoid ibuprofen/Advil/Aleve/Motrin/Goody Powders/Naproxen/BC powders/Meloxicam/Diclofenac/Indomethacin and other Nonsteroidal anti-inflammatory medications as these will make you more likely to bleed and can cause stomach ulcers, can also cause Kidney problems.  3)Consider outpatient Palliative Care follow up with [facility] services 4)Nephrologist recommends avoiding taking more than 4 L of fluid during paracentesis and giving iv  albumin with each episode of paracentesis---  Admission Diagnosis  Peripheral edema [R60.0] AKI (acute kidney injury) (HCC) [N17.9] Malfunction of Foley catheter, initial encounter (HCC) [T83.011A] Acute kidney injury superimposed on chronic kidney disease (HCC) [N17.9, N18.9] Acute renal failure superimposed on stage 3b chronic kidney disease (HCC) [N17.9, N18.32]   Discharge Diagnosis  Peripheral edema [R60.0] AKI (acute kidney injury) (HCC) [N17.9] Malfunction of Foley catheter, initial encounter (HCC) [T83.011A] Acute kidney injury superimposed on chronic kidney disease (HCC) [N17.9, N18.9] Acute renal failure superimposed on stage 3b chronic kidney disease (HCC) [N17.9, N18.32]    Principal Problem:   Acute kidney injury superimposed on chronic kidney disease (HCC) Active Problems:   Acquired hypothyroidism   Type 2 diabetes mellitus with diabetic chronic kidney disease (HCC)   Hypertension with renal disease   Thrombocytopenia (HCC)   Prolonged QT interval   Elevated brain natriuretic peptide (BNP) level    Hypoalbuminemia due to protein-calorie malnutrition (HCC)   Acute renal failure superimposed on stage 3b chronic kidney disease (HCC)   Malfunction of Foley catheter (HCC)      Past Medical History:  Diagnosis Date   Alkaline phosphatase elevation    Anemia    ASD (atrial septal defect)    Cataract    both eyes hx of   Chest pain    03-17-2013 last chest pain   Chronic kidney disease    Stage III kidney disease   Cirrhosis (HCC)    Depression    Diabetes mellitus type II    Family history of adverse reaction to anesthesia    Son hard to wake up   Fatigue    GERD (gastroesophageal reflux disease)    Gout    Headache(784.0)    occasional   Hyperlipidemia    Hypertension    Hypothyroidism    Neuropathy    Osteopenia    Peripheral neuropathy    Presence of pessary    Retinopathy     Past Surgical History:  Procedure Laterality Date   APPENDECTOMY     CATARACT EXTRACTION     CERVICAL SPINE SURGERY  2006   CHOLECYSTECTOMY N/A 04/07/2013   Procedure: LAPAROSCOPIC CHOLECYSTECTOMY WITH INTRAOPERATIVE CHOLANGIOGRAM;  Surgeon: Robyne Askew, MD;  Location: [facility];  Service: General;  Laterality: N/A;   COMBINED HYSTERECTOMY VAGINAL / OOPHORECTOMY /  A&P REPAIR  1987   Unilateral oophorectomy, h/o uterine prolapse has right ovary   ERCP N/A 04/06/2013   Procedure: ENDOSCOPIC RETROGRADE CHOLANGIOPANCREATOGRAPHY (ERCP);  Surgeon: Theda Belfast, MD;  Location: [facility];  Service: Endoscopy;  Laterality: N/A;   ESOPHAGEAL BANDING N/A 09/26/2021   Procedure: ESOPHAGEAL BANDING;  Surgeon: Jeani Hawking, MD;  Location: [facility];  Service: Gastroenterology;  Laterality: N/A;   ESOPHAGEAL BANDING N/A 10/31/2021   Procedure: ESOPHAGEAL BANDING;  Surgeon: Jeani Hawking, MD;  Location: [facility];  Service: Gastroenterology;  Laterality: N/A;   ESOPHAGOGASTRODUODENOSCOPY (EGD) WITH PROPOFOL N/A 08/16/2020   Procedure: ESOPHAGOGASTRODUODENOSCOPY (EGD) WITH PROPOFOL;  Surgeon:  Jeani Hawking, MD;  Location: [facility];  Service: Endoscopy;  Laterality: N/A;   ESOPHAGOGASTRODUODENOSCOPY (EGD) WITH PROPOFOL N/A 09/26/2021   Procedure: ESOPHAGOGASTRODUODENOSCOPY (EGD) WITH PROPOFOL;  Surgeon: Jeani Hawking, MD;  Location: [facility];  Service: Gastroenterology;  Laterality: N/A;   ESOPHAGOGASTRODUODENOSCOPY (EGD) WITH PROPOFOL N/A 10/31/2021   Procedure: ESOPHAGOGASTRODUODENOSCOPY (EGD) WITH PROPOFOL;  Surgeon: Jeani Hawking, MD;  Location: [facility];  Service: Gastroenterology;  Laterality: N/A;   EUS N/A 03/31/2013   Procedure: UPPER ENDOSCOPIC ULTRASOUND (EUS) LINEAR;  Surgeon: Theda Belfast, MD;  Location: [facility];  Service: Endoscopy;  Laterality: N/A;   REFRACTIVE SURGERY     SPINAL FUSION     c4-c7   TONSILLECTOMY  age 40     HPI  from the history and physical done on the day of admission:    HPI: [name] is a 72 y.o. female with medical history significant of NASH liver cirrhosis, T2DM, essential hypertension, hypothyroidism, chronic kidney disease stage III who presents to the emergency department from [facility] in [place] due to lower extremity edema and non-draining Foley.  She was accompanied by son at bedside. Patient was recently seen by her urologist due to chronic vaginal/bladder prolapse and a pessary was placed, a small laceration was noted on the prolapsed bladder, this was repaired and a Foley catheter was placed to allow the bladder to rest while healing.  The Foley drained partially on that day, but patient returned to the urologist and the Foley was readjusted, unfortunately, it was still not draining well. She was seen in the emergency department on 5/23 due to UTI whereby, IV Rocephin was given in the ED.  Foley catheter was changed due to urine draining around the catheter.  Apparently, patient has been prescribed with Bactrim by her gynecologist, she was advised to continue with the medication, she was discharged home from the  ED. She complained of increased leg swelling and increased abdominal girth which she thought was due to increased fluid due to ascites/cirrhosis.  Patient also complained of shortness of breath within the last 2 days She went to see her endocrinologist today and she was sent to the ED for diuresis and Foley catheter replacement. Patient was recently admitted from 5/28 to 5/31 due to acute kidney injury superimposed on chronic kidney disease   ED Course:  In the emergency department, HR was 55, BP 111/56 and other vital signs are within normal range.  Workup in the ED showed WBC 7.1, hemoglobin 10.2, hematocrit 30.6, MCV 98.1, platelets 54 BMP showed sodium 131, potassium 3.8, chloride 104, bicarb 19, glucose 143, BUN 61, creatinine 3.87, albumin 2.3, BNP 274, magnesium 2.4 She was treated with IV Lasix 40 mg x 1.  Hospitalist was asked to admit patient for further evaluation and management.   Review of Systems: Review of systems as noted  in the HPI. All other systems reviewed and are negative.     Hospital Course:   1)Acute kidney injury superimposed on chronic kidney disease stage 3b  Nephrologist consulted and making recommendations -Patient's renal function worsening back in May 2024 after Bactrim use Renally adjust medications, avoid nephrotoxic agents/dehydration/hypotension -Treated with Lasix and albumin -Urine output improving -Overall prognosis is poor.... Further Palliative care conversations advised   2)Liver cirrhosis with moderate ascites  -Status post paracentesis on 09/07/2022- -Suspect that patient is developing hepatorenal syndrome -Treated with IV Lasix and albumin --Nephrologist recommends avoiding taking more than 4 L of fluid during paracentesis and giving iv  albumin with each episode of paracentesis---   3)History of bladder prolapse and intermittent ureteral obstruction-- -pessary in place - discussed with Dr. Ronne Binning    4)Hypoalbuminemia possibly due to  moderate protein calorie malnutrition Albumin 2.3, protein supplement will be provided Additional IV albumin ordered by nephrology 6/10, 6/11 and 09/09/22 --Echocardiogram reassuring, LVEF 60-65% no RWMAs---no evidence of significant heart failure - 5)Pancytopenia with severe Thrombocytopenia possibly due to NASH cirrhosis of the liver -Platelets remains low around 30 K,, no bleeding noted,    6)Hypertension with renal disease -Nadolol dose reduced to 20 mg daily -Midodrine 10 mg 3 times daily for pressure support   Type 2 diabetes mellitus with diabetic chronic kidney disease Hemoglobin A1c was 6.0 on 08/17/2022 Continue ISS and hypoglycemic protocol     Acquired hypothyroidism Continue Synthroid    Social/Ethics--- discussed with patient's daughter Cala Bradford, and patient's son [name] = Patient is a DNR/DNI -recommends outpatient palliative care follow-up  Code Status: Full  Family Communication: Son and daughter at bedside  disposition: Return to [facility] eating for SNF rehab    Consultants:  Nephrology, palliative care and urology Procedures:  Paracentesis  Discharge Condition: stable  Follow UP   Follow-up Information     Benita Stabile, MD. Schedule an appointment as soon as possible for a visit.   Specialty: Internal Medicine Why: Repeat CBC, CMP, PT/INR----every Tuesday for next 4 weeks starting 09/15/22 Contact information: [address] [place] [address] [phone]                Diet and Activity recommendation:  As advised  Discharge Instructions    Discharge Instructions     Amb Referral to Palliative Care   Complete by: As directed    Call MD for:  difficulty breathing, headache or visual disturbances   Complete by: As directed    Call MD for:  persistant dizziness or light-headedness   Complete by: As directed    Call MD for:  persistant nausea and vomiting   Complete by: As directed    Call MD for:  temperature >100.4    Complete by: As directed    Diet - low sodium heart healthy   Complete by: As directed    Discharge instructions   Complete by: As directed    1)Repeat CBC, CMP, PT/INR----every Tuesday for next 4 weeks starting 09/15/22 2)Avoid ibuprofen/Advil/Aleve/Motrin/Goody Powders/Naproxen/BC powders/Meloxicam/Diclofenac/Indomethacin and other Nonsteroidal anti-inflammatory medications as these will make you more likely to bleed and can cause stomach ulcers, can also cause Kidney problems.  3)Consider outpatient Palliative Care follow up with [facility] services   Increase activity slowly   Complete by: As directed    No wound care   Complete by: As directed          Discharge Medications     Allergies as of 09/10/2022  Reactions   Bactrim [sulfamethoxazole-trimethoprim] Nausea And Vomiting, Nausea Only   Ciprofloxacin     severe yeast infection   Crestor [rosuvastatin]    arthralgia/myalgia   Lipitor [atorvastatin]    arthralgia/myalgia   Lisinopril Cough   Losartan Potassium Cough   Lovastatin    arthralgia/myalgia   Statins Other (See Comments)   arthralgia/myalgia   Welchol [colesevelam]    arthralgia/myalgia   Zetia [ezetimibe]    arthralgia/myalgia   Dilaudid [hydromorphone Hcl] Nausea And Vomiting   After one vomiting episode no further vomiting        Medication List     STOP taking these medications    nadolol 40 MG tablet Commonly known as: CORGARD Replaced by: propranolol 40 MG tablet       TAKE these medications    acetaminophen 325 MG tablet Commonly known as: TYLENOL Take 2 tablets (650 mg total) by mouth every 6 (six) hours as needed for mild pain (or Fever >/= 101).   allopurinol 100 MG tablet Commonly known as: ZYLOPRIM Take 100 mg by mouth every evening.   cholecalciferol 25 MCG (1000 UNIT) tablet Commonly known as: VITAMIN D3 Take 1,000 Units by mouth once a week. Wednesday weekly   insulin regular 100 units/mL  injection Commonly known as: NOVOLIN R Inject 5-8 Units into the skin 3 (three) times daily before meals. Sliding scale 0-250=0 251-300=5 301-350=6 351-400=7units 401-450 8 units   levothyroxine 125 MCG tablet Commonly known as: SYNTHROID 1 tablet in the morning on an empty stomach Orally Once a day for 30 day(s)   midodrine 10 MG tablet Commonly known as: PROAMATINE Take 1 tablet (10 mg total) by mouth 3 (three) times daily with meals.   neomycin-polymyxin b-dexamethasone 3.5-10000-0.1 Oint Commonly known as: MAXITROL Place into the right eye 2 (two) times daily.   NovoLIN N ReliOn 100 UNIT/ML injection Generic drug: insulin NPH Human Inject 14 Units into the skin 2 (two) times daily.   OneTouch Ultra test strip Generic drug: glucose blood   propranolol 40 MG tablet Commonly known as: INDERAL Take 0.5 tablets (20 mg total) by mouth daily. Replaces: nadolol 40 MG tablet   sodium bicarbonate 650 MG tablet Take 2 tablets (1,300 mg total) by mouth 2 (two) times daily. What changed: how much to take   Vitamin D (Ergocalciferol) 1.25 MG (50000 UNIT) Caps capsule Commonly known as: DRISDOL Take 50,000 Units by mouth every Saturday.        Major procedures and Radiology Reports - PLEASE review detailed and final reports for all details, in brief -   US Paracentesis  Result Date: 09/07/2022 INDICATION: Ascites EXAM: ULTRASOUND GUIDED LEFT LOWER QUADRANT PARACENTESIS MEDICATIONS: None. COMPLICATIONS: None immediate. PROCEDURE: Informed written consent was obtained from the patient after a discussion of the risks, benefits and alternatives to treatment. A timeout was performed prior to the initiation of the procedure. Initial ultrasound scanning demonstrates a large amount of ascites within the left lower abdominal quadrant. The left lower abdomen was prepped and draped in the usual sterile fashion. 1% lidocaine was used for local anesthesia. Following this, a Yueh catheter was  introduced. An ultrasound image was saved for documentation purposes. The paracentesis was performed. The catheter was removed and a dressing was applied. The patient tolerated the procedure well without immediate post procedural complication. FINDINGS: A total of approximately 4 L of straw-colored fluid was removed. Samples were sent to the laboratory as requested by the clinical team. IMPRESSION: 1. Successful ultrasound-guided paracentesis yielding 4  liters of peritoneal fluid. Electronically Signed   By: Gaylyn Rong M.D.   On: 09/07/2022 15:20   CT RENAL STONE STUDY  Result Date: 09/04/2022 CLINICAL DATA:  Bladder neck obstruction, foreign body. EXAM: CT ABDOMEN AND PELVIS WITHOUT CONTRAST TECHNIQUE: Multidetector CT imaging of the abdomen and pelvis was performed following the standard protocol without IV contrast. RADIATION DOSE REDUCTION: This exam was performed according to the departmental dose-optimization program which includes automated exposure control, adjustment of the mA and/or kV according to patient size and/or use of iterative reconstruction technique. COMPARISON:  Aug 25, 2022. FINDINGS: Lower chest: No acute abnormality. Hepatobiliary: Nodular hepatic contours are noted consistent with hepatic cirrhosis. Status post cholecystectomy. No biliary dilatation is noted. Pancreas: Diffuse pancreatic atrophy is noted. Spleen: Mild splenomegaly. Adrenals/Urinary Tract: Adrenal glands are unremarkable. Kidneys are normal, without renal calculi, focal lesion, or hydronephrosis. Bladder is unremarkable. Stomach/Bowel: Stomach is unremarkable. There is no evidence of bowel obstruction or inflammation. Vascular/Lymphatic: Aortic atherosclerosis. No enlarged abdominal or pelvic lymph nodes. Left upper quadrant varices are noted consistent with portal hypertension. Reproductive: Pessary is again noted. Status post hysterectomy. No adnexal abnormality. Other: Mild to moderate ascites is noted. No  definite hernia is noted. Musculoskeletal: Stable old L4 compression fracture is noted. No acute osseous abnormality is noted. IMPRESSION: Hepatic cirrhosis with mild splenomegaly and ascites. Enlarged varices are noted in left upper quadrant consistent with portal hypertension. Aortic Atherosclerosis (ICD10-I70.0). Electronically Signed   By: Lupita Raider M.D.   On: 09/04/2022 13:08   ECHOCARDIOGRAM COMPLETE  Result Date: 09/04/2022    ECHOCARDIOGRAM REPORT   Patient Name:   [name] Date of Exam: 09/04/2022 Medical Rec #:  [id number]     Height:       66.0 in Accession #:    [id number]    Weight:       209.7 lb Date of Birth:  1950-04-13     BSA:          2.040 m Patient Age:    71 years      BP:           96/44 mmHg Patient Gender: F             HR:           58 bpm. [facility] Procedure: 2D Echo, Cardiac Doppler and Color Doppler Indications:    CHF-Acute Diastolic I50.31  History:        Patient has prior history of Echocardiogram examinations, most                 recent 02/12/2010. Risk Factors:Hypertension, Diabetes and                 Dyslipidemia.  Sonographer:    Celesta Gentile RCS Referring Phys: [id number] OLADAPO ADEFESO IMPRESSIONS  1. Left ventricular ejection fraction, by estimation, is 60 to 65%. The left ventricle has normal function. The left ventricle has no regional wall motion abnormalities. There is mild left ventricular hypertrophy. Left ventricular diastolic parameters are indeterminate.  2. Right ventricular systolic function is normal. The right ventricular size is normal.  3. Left atrial size was mild to moderately dilated.  4. The mitral valve is abnormal. Trivial mitral valve regurgitation. No evidence of mitral stenosis. Severe mitral annular calcification.  5. The aortic valve has an indeterminant number of cusps. There is moderate calcification of the aortic valve. Aortic valve regurgitation is not visualized. No aortic stenosis is present.  6. The inferior vena  cava is dilated but collapsibility is not well visualized. Comparison(s): No prior Echocardiogram. FINDINGS  Left Ventricle: Left ventricular ejection fraction, by estimation, is 60 to 65%. The left ventricle has normal function. The left ventricle has no regional wall motion abnormalities. The left ventricular internal cavity size was normal in size. There is  mild left ventricular hypertrophy. Left ventricular diastolic parameters are indeterminate. Right Ventricle: The right ventricular size is normal. No increase in right ventricular wall thickness. Right ventricular systolic function is normal. Left Atrium: Left atrial size was mild to moderately dilated. Right Atrium: Right atrial size was normal in size. Pericardium: There is no evidence of pericardial effusion. Mitral Valve: The mitral valve is abnormal. Severe mitral annular calcification. Trivial mitral valve regurgitation. No evidence of mitral valve stenosis. Tricuspid Valve: The tricuspid valve is normal in structure. Tricuspid valve regurgitation is mild . No evidence of tricuspid stenosis. Aortic Valve: The aortic valve has an indeterminant number of cusps. There is moderate calcification of the aortic valve. Aortic valve regurgitation is not visualized. No aortic stenosis is present. Pulmonic Valve: The pulmonic valve was not well visualized. Pulmonic valve regurgitation is not visualized. No evidence of pulmonic stenosis. Aorta: The aortic root is normal in size and structure. Venous: The inferior vena cava is dilated but collapsibility is not well visualized. IAS/Shunts: No atrial level shunt detected by color flow Doppler. Additional Comments: Moderate ascites is present.  LEFT VENTRICLE PLAX 2D LVIDd:         4.70 cm   Diastology LVIDs:         3.00 cm   LV e' medial:    5.11 cm/s LV PW:         1.20 cm   LV E/e' medial:  22.9 LV IVS:        1.00 cm   LV e' lateral:   8.92 cm/s LVOT diam:     1.90 cm   LV E/e' lateral: 13.1 LV SV:         82 LV  SV Index:   40 LVOT Area:     2.84 cm  RIGHT VENTRICLE RV S prime:     13.30 cm/s TAPSE (M-mode): 2.3 cm LEFT ATRIUM           Index        RIGHT ATRIUM           Index LA diam:      3.70 cm 1.81 cm/m   RA Area:     13.90 cm LA Vol (A2C): 50.7 ml 24.85 ml/m  RA Volume:   31.80 ml  15.59 ml/m LA Vol (A4C): 82.4 ml 40.39 ml/m  AORTIC VALVE LVOT Vmax:   105.00 cm/s LVOT Vmean:  75.900 cm/s LVOT VTI:    0.288 m  AORTA Ao Root diam: 3.30 cm MITRAL VALVE                TRICUSPID VALVE MV Area (PHT): 1.91 cm     TR Peak grad:   18.3 mmHg MV Decel Time: 398 msec     TR Vmax:        214.00 cm/s MV E velocity: 117.00 cm/s MV A velocity: 102.00 cm/s  SHUNTS MV E/A ratio:  1.15         Systemic VTI:  0.29 m  Systemic Diam: 1.90 cm Vishnu Priya Mallipeddi Electronically signed by Winfield Rast Mallipeddi Signature Date/Time: 09/04/2022/11:32:03 AM    Final    DG Chest 2 View  Result Date: 09/03/2022 CLINICAL DATA:  Fluid overload. EXAM: CHEST - 2 VIEW COMPARISON:  08/17/2022. FINDINGS: Heart is normal in size and the mediastinal contour is within normal limits. There is atherosclerotic calcification of the aorta. Mild interstitial prominence is present bilaterally. No effusion or pneumothorax. Cervical spinal fusion hardware is noted no acute osseous abnormality. IMPRESSION: Mild interstitial prominence bilaterally, possible edema or infiltrate. Electronically Signed   By: Thornell Sartorius M.D.   On: 09/03/2022 20:20   CT CHEST ABDOMEN PELVIS WO CONTRAST  Result Date: 08/25/2022 CLINICAL DATA:  Weakness, nausea and vomiting. Recent antibiotic treatment for UTI. Poly trauma, blunt. Fell today. EXAM: CT CHEST, ABDOMEN AND PELVIS WITHOUT CONTRAST TECHNIQUE: Multidetector CT imaging of the chest, abdomen and pelvis was performed following the standard protocol without IV contrast. RADIATION DOSE REDUCTION: This exam was performed according to the departmental dose-optimization program which  includes automated exposure control, adjustment of the mA and/or kV according to patient size and/or use of iterative reconstruction technique. COMPARISON:  CT abdomen 08/20/2022 FINDINGS: CT CHEST FINDINGS Cardiovascular: Heart size is normal. There is coronary artery calcification and aortic atherosclerotic calcification. Mediastinum/Nodes: No mass or lymphadenopathy. Some varices are present. Lungs/Pleura: No pleural effusion. Mild chronic scarring at the lung bases left more than right. No evidence of pneumonia or mass lesion. Musculoskeletal: No thoracic region fracture. No evidence of rib or sternal fracture. CT ABDOMEN PELVIS FINDINGS Hepatobiliary: Advanced cirrhosis of the liver as seen previously. Previous cholecystectomy. Pancreas: Chronic pancreatic atrophy and calcifications. No acute process. Spleen: Splenomegaly suggesting portal venous hypertension. Adrenals/Urinary Tract: Adrenal glands are normal. The left is not well seen because of regional varices. No renal parenchymal abnormality. Catheter present in the bladder. Stomach/Bowel: Stomach and small intestine are unremarkable. No acute colon finding. Vascular/Lymphatic: Aortic atherosclerosis. No aneurysm. IVC is normal. No adenopathy. Portal hypertension associated varices. Reproductive: Pessary in place.  No pelvic mass. Other: Free fluid, normally distributed. No hyperdense fluid. No free air. Musculoskeletal: Old appearing compression deformities of the L4 vertebral body. No evidence of acute regional fracture. IMPRESSION: 1. No acute or traumatic finding in the chest, abdomen or pelvis. 2. Advanced cirrhosis of the liver with portal hypertension, splenomegaly, varices and ascites. 3. Aortic atherosclerosis. Coronary artery calcification. 4. Chronic pancreatic atrophy and calcifications. 5. Old appearing compression fracture of the L4 vertebral body. No evidence of acute regional fracture. Aortic Atherosclerosis (ICD10-I70.0). Electronically  Signed   By: Paulina Fusi M.D.   On: 08/25/2022 12:52   CT Head Wo Contrast  Result Date: 08/25/2022 CLINICAL DATA:  Head trauma, minor. EXAM: CT HEAD WITHOUT CONTRAST TECHNIQUE: Contiguous axial images were obtained from the base of the skull through the vertex without intravenous contrast. RADIATION DOSE REDUCTION: This exam was performed according to the departmental dose-optimization program which includes automated exposure control, adjustment of the mA and/or kV according to patient size and/or use of iterative reconstruction technique. COMPARISON:  Head CT 05/23/2017. FINDINGS: Brain: No acute intracranial hemorrhage. Gray-white differentiation is preserved. No hydrocephalus or extra-axial collection. No mass effect or midline shift. Vascular: No hyperdense vessel or unexpected calcification. Skull: No calvarial fracture or suspicious bone lesion. Skull base is unremarkable. Sinuses/Orbits: Chronic right maxillary sinusitis. Orbits are unremarkable. Mastoids are well aerated. Other: None. IMPRESSION: No evidence of acute intracranial injury. Electronically Signed   By: Orvan Falconer  M.D.   On: 08/25/2022 12:45   DG Knee 2 Views Right  Result Date: 08/25/2022 CLINICAL DATA:  Right knee pain since fall today about an hour ago EXAM: RIGHT KNEE - 1-2 VIEW COMPARISON:  None available FINDINGS: No fracture or dislocation. No acute soft tissue abnormality. Atherosclerotic changes seen throughout visualized arterial segments. Deformity of the proximal fibular diaphysis consistent with remote healed fracture. IMPRESSION: No acute abnormality of the right knee. Electronically Signed   By: Acquanetta Belling M.D.   On: 08/25/2022 12:19   DG Chest 1 View  Result Date: 08/25/2022 CLINICAL DATA:  Evaluation of sepsis EXAM: CHEST  1 VIEW COMPARISON:  Chest radiograph dated 07/16/2020 FINDINGS: Normal lung volumes. No focal consolidations. No pleural effusion or pneumothorax. The heart size and mediastinal contours  are within normal limits. Cervical spinal fixation hardware appears intact. IMPRESSION: No active disease. Electronically Signed   By: Agustin Cree M.D.   On: 08/25/2022 11:36   CT Renal Stone Study  Result Date: 08/21/2022 CLINICAL DATA:  Nausea, right-sided abdominal pain, and weakness EXAM: CT ABDOMEN AND PELVIS WITHOUT CONTRAST TECHNIQUE: Multidetector CT imaging of the abdomen and pelvis was performed following the standard protocol without IV contrast. RADIATION DOSE REDUCTION: This exam was performed according to the departmental dose-optimization program which includes automated exposure control, adjustment of the mA and/or kV according to patient size and/or use of iterative reconstruction technique. COMPARISON:  CT urogram 09/06/2019 and ultrasound 12/18/2020 FINDINGS: Lower chest: Left basilar scarring.  No acute abnormality. Hepatobiliary: Cholecystectomy. Prominent bile ducts likely due to reservoir effect. Nodular hepatic contour consistent with cirrhosis. Pancreas: Atrophic. Coarse calcifications in the pancreatic body and head. No acute abnormality. Spleen: Borderline splenomegaly measuring 13.3 cm in craniocaudal dimension. Adrenals/Urinary Tract: Stable adrenal glands. No urinary calculi or hydronephrosis. Foley catheter in the nondistended bladder. Stomach/Bowel: Normal caliber large and small bowel. Colonic diverticulosis without diverticulitis. 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 Stomach is within normal limits. The appendix is not visualized. Vascular/Lymphatic: Aortic atherosclerosis. Upper abdominal varices. No enlarged abdominal or pelvic lymph nodes. Reproductive: Status post hysterectomy. No adnexal masses.  Pessary. Other: Diffuse mesenteric edema. Small volume abdominopelvic ascites which is low-density. Musculoskeletal: Demineralization. No acute osseous abnormality. Chronic superior endplate compression of  L4 IMPRESSION: 1. 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 2. 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 3. Small volume abdominopelvic ascites and diffuse mesenteric edema. 4. Sequela of chronic pancreatitis. Aortic Atherosclerosis (ICD10-I70.0). Electronically Signed   By: Minerva Fester M.D.   On: 08/21/2022 00:05    Micro Results  Recent Results (from the past 240 hour(s))  Urine Culture     Status: Abnormal   Collection Time: 09/04/22  4:22 AM   Specimen: Urine, Random  Result Value Ref Range Status   Specimen Description   Final    URINE, RANDOM Performed at [facility], [address] [place], [place] [address]    Special Requests   Final    NONE Reflexed from [id number] Performed at [facility], [address], [place], [place] [address]    Culture (A)  Final    20,000 COLONIES/mL ENTEROCOCCUS FAECALIS 30,000 COLONIES/mL YEAST    Report Status 09/07/2022 FINAL  Final   Organism ID, Bacteria ENTEROCOCCUS FAECALIS (A)  Final      Susceptibility   Enterococcus faecalis - MIC*    AMPICILLIN <=2 SENSITIVE Sensitive  NITROFURANTOIN <=16 SENSITIVE Sensitive     VANCOMYCIN 1 SENSITIVE Sensitive     * 20,000 COLONIES/mL ENTEROCOCCUS FAECALIS  Culture, body fluid w Gram Stain-bottle     Status: None (Preliminary result)   Collection Time: 09/07/22  2:00 PM   Specimen: Ascitic  Result Value Ref Range Status   Specimen Description ASCITIC  Final   Special Requests BAA 10CC  Final   Culture   Final    NO GROWTH 3 DAYS Performed at [facility], [address] [place], [place] [address]    Report Status PENDING  Incomplete  Gram stain     Status: None   Collection Time: 09/07/22  2:00 PM   Specimen: Ascitic  Result Value Ref Range Status   Specimen Description ASCITIC  Final   Special Requests NONE  Final   Gram Stain   Final    WBC PRESENT,BOTH PMN AND MONONUCLEAR NO ORGANISMS SEEN CYTOSPIN  SMEAR Performed at [facility], [address] [place], [place] [address]    Report Status 09/07/2022 FINAL  Final    Today   Subjective    [name] today has no new complaints -Oral intake is fair -Family members at bedside        No fever  Or chills   No Nausea, Vomiting or Diarrhea  Patient has been seen and examined prior to discharge   Objective   Blood pressure (!) 119/50, pulse 63, temperature 97.9 F (36.6 C), temperature source Oral, resp. rate 18, height 5\' 6"  (1.676 m), weight 92.4 kg, SpO2 98 %.   Intake/Output Summary (Last 24 hours) at 09/10/2022 1608 Last data filed at 09/10/2022 0616 Gross per 24 hour  Intake 480 ml  Output 500 ml  Net -20 ml   Exam Gen:- Awake Alert, no acute distress  HEENT:- [place].AT, No sclera icterus Neck-Supple Neck,No JVD,.  Lungs-  CTAB , good air movement bilaterally CV- S1, S2 normal, regular Abd-  +ve B.Sounds, Abd Soft, No tenderness, no significant ascites at this time Extremity/Skin:- No significant edema,   good pulses Psych-affect is appropriate, oriented x3 Neuro-generalized weakness, no new focal deficits, no tremors    Data Review   CBC w Diff:  Lab Results  Component Value Date   WBC 3.5 (L) 09/10/2022   HGB 8.8 (L) 09/10/2022   HCT 26.1 (L) 09/10/2022   PLT 30 (L) 09/10/2022   LYMPHOPCT 17 09/03/2022   MONOPCT 7 09/03/2022   EOSPCT 6 09/03/2022   BASOPCT 0 09/03/2022    CMP:  Lab Results  Component Value Date   NA 130 (L) 09/10/2022   K 4.0 09/10/2022   CL 103 09/10/2022   CO2 18 (L) 09/10/2022   BUN 72 (H) 09/10/2022   CREATININE 3.69 (H) 09/10/2022   PROT 4.9 (L) 09/05/2022   ALBUMIN 3.1 (L) 09/10/2022   BILITOT 1.6 (H) 09/05/2022   ALKPHOS 65 09/05/2022   AST 25 09/05/2022   ALT 17 09/05/2022  .  Total Discharge time is about 33 minutes  Shon Hale M.D on 09/10/2022 at 4:08 PM  Go to [email or web address] -  for contact info  [facility] - Office  [phone]

## 2022-09-10 NOTE — TOC Transition Note (Signed)
Transition of Care [facility]) - CM/SW Discharge Note   Patient Details  Name: [name] MRN: [id number] Date of Birth: [birth year]  Transition of Care [facility] CM/SW Contact:  Elliot Gault, LCSW Phone Number: 09/10/2022, 2:30 PM   Clinical Narrative:     Pt medically stable for dc to SNF today per MD. SNF auth remains in effect. Updated Jill Side at [facility] and they can admit pt today.  Spoke with pt who remains in agreement with dc plan. She states she has updated her family.   DC clinical sent electronically. RN to call report. Transport arranged with Juel Burrow w/c [facility].  No other TOC needs for dc.  Final next level of care: Skilled Nursing Facility Barriers to Discharge: Barriers Resolved   Patient Goals and CMS Choice CMS Medicare.gov Compare Post Acute Care list provided to:: Patient Choice offered to / list presented to : Patient  Discharge Placement                  Patient to be transferred to facility by: [place] Name of family member notified: pt only (pt stated she would let family know) Patient and family notified of of transfer: 09/10/22  Discharge Plan and Services Additional resources added to the After Visit Summary for   In-house Referral: Clinical Social Work Discharge Planning Services: CM Consult Post Acute Care Choice: Skilled Nursing Facility                               Social Determinants of Health (SDOH) Interventions SDOH Screenings   Food Insecurity: No Food Insecurity (09/03/2022)  Housing: Low Risk  (09/03/2022)  Transportation Needs: No Transportation Needs (09/03/2022)  Utilities: Not At Risk (09/03/2022)  Alcohol Screen: Low Risk  (10/30/2019)  Depression (PHQ2-9): Medium Risk (10/30/2019)  Financial Resource Strain: Medium Risk (10/30/2019)  Physical Activity: Inactive (10/30/2019)  Social Connections: Moderately Integrated (10/30/2019)  Stress: No Stress Concern Present (10/30/2019)  Tobacco Use: Low Risk  (09/10/2022)     Readmission Risk  Interventions    09/04/2022   12:38 PM  Readmission Risk Prevention Plan  Transportation Screening Complete  HRI or Home Care Consult Complete  Social Work Consult for Recovery Care Planning/Counseling Complete  Palliative Care Screening Complete  Medication Review Oceanographer) Complete

## 2022-09-11 ENCOUNTER — Ambulatory Visit (INDEPENDENT_AMBULATORY_CARE_PROVIDER_SITE_OTHER): Payer: Medicare Other | Admitting: Obstetrics & Gynecology

## 2022-09-11 ENCOUNTER — Encounter: Payer: Self-pay | Admitting: Obstetrics & Gynecology

## 2022-09-11 VITALS — BP 139/64 | HR 80

## 2022-09-11 DIAGNOSIS — S3720XA Unspecified injury of bladder, initial encounter: Secondary | ICD-10-CM | POA: Diagnosis not present

## 2022-09-11 DIAGNOSIS — M7989 Other specified soft tissue disorders: Secondary | ICD-10-CM | POA: Diagnosis not present

## 2022-09-11 DIAGNOSIS — N898 Other specified noninflammatory disorders of vagina: Secondary | ICD-10-CM | POA: Diagnosis not present

## 2022-09-11 DIAGNOSIS — S82831A Other fracture of upper and lower end of right fibula, initial encounter for closed fracture: Secondary | ICD-10-CM | POA: Diagnosis not present

## 2022-09-11 DIAGNOSIS — Z882 Allergy status to sulfonamides status: Secondary | ICD-10-CM | POA: Diagnosis not present

## 2022-09-11 DIAGNOSIS — S82401A Unspecified fracture of shaft of right fibula, initial encounter for closed fracture: Secondary | ICD-10-CM | POA: Diagnosis not present

## 2022-09-11 DIAGNOSIS — W1839XA Other fall on same level, initial encounter: Secondary | ICD-10-CM | POA: Diagnosis not present

## 2022-09-11 DIAGNOSIS — Z4689 Encounter for fitting and adjustment of other specified devices: Secondary | ICD-10-CM

## 2022-09-11 DIAGNOSIS — M25551 Pain in right hip: Secondary | ICD-10-CM | POA: Diagnosis not present

## 2022-09-11 DIAGNOSIS — Z743 Need for continuous supervision: Secondary | ICD-10-CM | POA: Diagnosis not present

## 2022-09-11 DIAGNOSIS — R079 Chest pain, unspecified: Secondary | ICD-10-CM | POA: Diagnosis not present

## 2022-09-11 DIAGNOSIS — R6889 Other general symptoms and signs: Secondary | ICD-10-CM | POA: Diagnosis not present

## 2022-09-11 DIAGNOSIS — I10 Essential (primary) hypertension: Secondary | ICD-10-CM | POA: Diagnosis not present

## 2022-09-11 DIAGNOSIS — W19XXXA Unspecified fall, initial encounter: Secondary | ICD-10-CM | POA: Diagnosis not present

## 2022-09-11 DIAGNOSIS — Z885 Allergy status to narcotic agent status: Secondary | ICD-10-CM | POA: Diagnosis not present

## 2022-09-11 NOTE — Progress Notes (Unsigned)
History of Present Illness: [name] is a 72 y.o. female who presents today as a new patient at [facility]. All available relevant medical records have been reviewed. She is accompanied by ***. - GU/GYN History: 1. Complete prolapse of vaginal vault. 2. 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 See details below. 3. Surgical history includes hysterectomy.  Recent history: > 07/24/2022: Seen by Dr. Despina Hidden (GYN) for pessary check. Per note: "Pessary failure yesterday, placed a #7 Milex ring with support and her Gelhorn over the top".   > 08/18/2022 - Per Dr. Forestine Chute note:  - "At the time I first saw her she was referred to me from urology with a Foley catheter in place and the largest vaginal vault defect I had ever seen...placed a Gellhorn pessary that day and removed her Foley catheter and she has done great with that this entire time" until April 2024 when "on exam it was confirmed that her vaginal apex was "seeping" over the top of her Gellhorn pessary". - On 07/24/22 she failed all standard pessary fittings; ultimately "used a #7 Milex ring with support which of course by itself was not enough with her original Gellhorn 2 and three-quarter inch on top of that".  - On 08/18/22 exam "she had a pressure ulcer from approximately 12:00 to 2:00 from the stiff outer rim of the Milex ring with support.  It was a relatively shallow defect but her tissue was so attenuated from the size of the lesion that there was 1 area about the size of a match head that had injured the bladder. It was large enough that from a infection standpoint needed to be closed and it was well away from the trigone so there was no concern about injury of the ureters.  I prepped the area with Betadine and did a 3 layer closure of the bladder and then imbricating layer of the vagina over the bladder to prevent cystitis and urosepsis. I placed a Foley catheter to allow the bladder to drain and  hopefully allow for healing of the vaginal erosion." Was prescribed Bactrim DS 2x/day for 14 days.  > 08/20/2022: Seen in ER for nausea, weakness, and abdominal pain worsening over the past 2 days. No acute findings per vitals, labs, exam, CT abdomen/pelvis. Foley exchanged due to urine drainage around catheter. Advised to continue antibiotic for acute cystitis.   > 08/25/2022 - 08/28/2022: Admitted for AKI on CKD stage 3 in the setting of recent Bactrim use. Discharge plan included "avoid use of Bactrim and torsemide at this time" and continue Foley catheter.   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  > 09/11/2022:  - Seen by Dr. Despina Hidden (GYN) for pessary check. Per note: "Vaginal/bladder mucosal injury has healed well can barely rell where the defect was no new erosions"..."not leaking any urine at present with foley being out". Currently using Gelhorn pessary only (not that + ring with support).  - Seen later that day in ER following a fall in which she sustained a closed fracture of proximal end of right fibula.   Today:  She reports chief complaint of ***  She reports *** urinary stream. She {Actions; denies-reports:120008} urgency. She {Actions; denies-reports:120008} ***increased urinary frequency (*** times per day and *** times at  night). She {Actions; denies-reports:120008} dysuria.  She {Actions; denies-reports:120008} gross hematuria. She {Actions; denies-reports:120008} the need to strain to void.  She {Actions; denies-reports:120008} sensations of incomplete emptying.  She reports *** UTls in past 12 months. When present, UTI symptoms include ***increased urinary urgency, frequency, dysuria, ***.  She {Actions; denies-reports:120008}  urge incontinence. She {Actions; denies-reports:120008} stress incontinence with ***cough/***laugh/***sneeze/***heavy lifting /***exercise. She reports the ***SUI / ***UUI is predominant.  She leaks *** times per ***. Wears *** ***pads/ ***diapers per day. She reports urinary incontinence {ACTION; IS/IS ZOX:[id number]} significantly bothersome.   She {Actions; denies-reports:120008} flank pain. She {Actions; denies-reports:120008} abdominal pain. She {Actions; denies-reports:120008} fevers. She {Actions; denies-reports:120008} nausea/ vomiting.   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 - Based on today's PVR, bladder is emptying ***adequately with Gelhorn pessary in place.  - ***No evidence of acute UTI per today's UA.  - Advised to continue routine follow up with Dr. Despina Hidden for pessary management.  - Advised to continue routine follow up with PCP and nephrology for CKD and other chronic medical conditions.  - Advised to follow up with urology in 3 months with PVR for recheck or sooner if needed, such as for acute UTI symptoms.   Fall Screening: Do you usually have a device to assist in your mobility? {yes/no:20286} ***cane / ***walker / ***wheelchair  Medications: Current Outpatient Medications  Medication Sig Dispense Refill   acetaminophen (TYLENOL) 325 MG tablet Take 2 tablets (650 mg total) by mouth every 6 (six) hours as needed for mild pain (or Fever >/= 101). 100 tablet 0   allopurinol (ZYLOPRIM) 100 MG tablet Take 100 mg by mouth every evening.      cholecalciferol (VITAMIN D3) 25 MCG (1000 UNIT) tablet Take 1,000 Units by mouth once a week. Wednesday weekly     insulin regular (NOVOLIN R) 100 units/mL injection Inject 5-8 Units into the skin 3 (three) times daily before meals. Sliding scale 0-250=0 251-300=5 301-350=6 351-400=7units 401-450 8 units     levothyroxine (SYNTHROID) 125 MCG tablet 1 tablet in the morning on an empty  stomach Orally Once a day for 30 day(s)     midodrine (PROAMATINE) 10 MG tablet Take 1 tablet (10 mg total) by mouth 3 (three) times daily with meals. 90 tablet 3   neomycin-polymyxin b-dexamethasone (MAXITROL) 3.5-10000-0.1 OINT Place into the right eye 2 (two) times daily.     NOVOLIN N RELION 100 UNIT/ML injection Inject 14 Units into the skin 2 (two) times daily.     ONETOUCH ULTRA test strip      propranolol (INDERAL) 40 MG tablet Take 0.5 tablets (20 mg total) by mouth daily. 45 tablet 2   sodium bicarbonate 650 MG tablet Take 2 tablets (1,300 mg total) by mouth 2 (two) times daily. 120 tablet 4   Vitamin D, Ergocalciferol, (DRISDOL) 1.25 MG (50000 UT) CAPS capsule Take 50,000 Units by mouth every Saturday.     No current facility-administered medications for this visit.    Allergies: Allergies  Allergen Reactions   Bactrim [Sulfamethoxazole-Trimethoprim] Nausea And Vomiting and Nausea Only   Ciprofloxacin      severe yeast infection   Crestor [Rosuvastatin]     arthralgia/myalgia   Lipitor [Atorvastatin]     arthralgia/myalgia   Lisinopril Cough   Losartan Potassium Cough   Lovastatin     arthralgia/myalgia   Statins Other (See Comments)    arthralgia/myalgia   Welchol [Colesevelam]     arthralgia/myalgia   Zetia [Ezetimibe]  arthralgia/myalgia   Dilaudid [Hydromorphone Hcl] Nausea And Vomiting    After one vomiting episode no further vomiting    Past Medical History:  Diagnosis Date   Alkaline phosphatase elevation    Anemia    ASD (atrial septal defect)    Cataract    both eyes hx of   Chest pain    03-17-2013 last chest pain   Chronic kidney disease    Stage III kidney disease   Cirrhosis (HCC)    Depression    Diabetes mellitus type II    Family history of adverse reaction to anesthesia    Son hard to wake up   Fatigue    GERD (gastroesophageal reflux disease)    Gout    Headache(784.0)    occasional   Hyperlipidemia    Hypertension     Hypothyroidism    Neuropathy    Osteopenia    Peripheral neuropathy    Presence of pessary    Retinopathy    Past Surgical History:  Procedure Laterality Date   APPENDECTOMY     CATARACT EXTRACTION     CERVICAL SPINE SURGERY  2006   CHOLECYSTECTOMY N/A 04/07/2013   Procedure: LAPAROSCOPIC CHOLECYSTECTOMY WITH INTRAOPERATIVE CHOLANGIOGRAM;  Surgeon: Robyne Askew, MD;  Location: [facility];  Service: General;  Laterality: N/A;   COMBINED HYSTERECTOMY VAGINAL / OOPHORECTOMY / A&P REPAIR  1987   Unilateral oophorectomy, h/o uterine prolapse has right ovary   ERCP N/A 04/06/2013   Procedure: ENDOSCOPIC RETROGRADE CHOLANGIOPANCREATOGRAPHY (ERCP);  Surgeon: Theda Belfast, MD;  Location: [facility];  Service: Endoscopy;  Laterality: N/A;   ESOPHAGEAL BANDING N/A 09/26/2021   Procedure: ESOPHAGEAL BANDING;  Surgeon: Jeani Hawking, MD;  Location: [facility];  Service: Gastroenterology;  Laterality: N/A;   ESOPHAGEAL BANDING N/A 10/31/2021   Procedure: ESOPHAGEAL BANDING;  Surgeon: Jeani Hawking, MD;  Location: [facility];  Service: Gastroenterology;  Laterality: N/A;   ESOPHAGOGASTRODUODENOSCOPY (EGD) WITH PROPOFOL N/A 08/16/2020   Procedure: ESOPHAGOGASTRODUODENOSCOPY (EGD) WITH PROPOFOL;  Surgeon: Jeani Hawking, MD;  Location: [facility];  Service: Endoscopy;  Laterality: N/A;   ESOPHAGOGASTRODUODENOSCOPY (EGD) WITH PROPOFOL N/A 09/26/2021   Procedure: ESOPHAGOGASTRODUODENOSCOPY (EGD) WITH PROPOFOL;  Surgeon: Jeani Hawking, MD;  Location: [facility];  Service: Gastroenterology;  Laterality: N/A;   ESOPHAGOGASTRODUODENOSCOPY (EGD) WITH PROPOFOL N/A 10/31/2021   Procedure: ESOPHAGOGASTRODUODENOSCOPY (EGD) WITH PROPOFOL;  Surgeon: Jeani Hawking, MD;  Location: [facility];  Service: Gastroenterology;  Laterality: N/A;   EUS N/A 03/31/2013   Procedure: UPPER ENDOSCOPIC ULTRASOUND (EUS) LINEAR;  Surgeon: Theda Belfast, MD;  Location: [facility];  Service: Endoscopy;  Laterality: N/A;    REFRACTIVE SURGERY     SPINAL FUSION     c4-c7   TONSILLECTOMY  age 41   Family History  Problem Relation Age of Onset   COPD Mother    Lung cancer Father    Diabetes Sister    Cataracts Sister    Insulin resistance Daughter    Insulin resistance Son    Social History   Socioeconomic History   Marital status: Divorced    Spouse name: Not on file   Number of children: 3   Years of education: 12   Highest education level: Not on file  Occupational History   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 Reports that her son recently moved out after living with her for a long time. She reports that she is happy to be living alone and feels like she was enabling his behavior. Reports that he had a history of drug use.   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    No pets.   Eats all food groups.    Wears seat belt.    Lives in house.    Smoke detectors.    Social Determinants of Health   Financial Resource Strain: Medium Risk (10/30/2019)   Overall Financial Resource Strain (CARDIA)    Difficulty of Paying Living Expenses: Somewhat hard  Food Insecurity: No Food Insecurity (09/03/2022)   Hunger Vital Sign    Worried About Running Out of Food in the Last Year: Never true    Ran Out of Food in the Last Year: Never true  Transportation Needs: No Transportation Needs (09/03/2022)   PRAPARE - Administrator, Civil Service (Medical): No    Lack of Transportation (Non-Medical): No  Physical Activity: Inactive (10/30/2019)   Exercise Vital Sign    Days of  Exercise per Week: 0 days    Minutes of Exercise per Session: 0 min  Stress: No Stress Concern Present (10/30/2019)   [employer] of Occupational Health - Occupational Stress Questionnaire    Feeling of Stress : Not at all  Social Connections: Moderately Integrated (10/30/2019)   Social Connection and Isolation Panel [NHANES]    Frequency of Communication with Friends and Family: More than three times a week    Frequency of Social Gatherings with Friends and Family: Three times a week    Attends Religious Services: More than 4 times per year    Active Member of Clubs or Organizations: Yes    Attends Banker Meetings: 1 to 4 times per year    Marital Status: Divorced  Intimate Partner Violence: Not At Risk (09/03/2022)   Humiliation, Afraid, Rape, and Kick questionnaire    Fear of Current or Ex-Partner: No    Emotionally Abused: No    Physically Abused: No    Sexually Abused: No    SUBJECTIVE  Review of Systems Constitutional: Patient ***denies any unintentional weight loss or change in strength lntegumentary: Patient ***denies any rashes or pruritus Eyes: Patient denies ***dry eyes ENT: Patient ***denies dry mouth Cardiovascular: Patient ***denies chest pain or syncope Respiratory: Patient ***denies shortness of breath Gastrointestinal: Patient ***denies nausea, vomiting, constipation, or diarrhea Musculoskeletal: Patient ***denies muscle cramps or weakness Neurologic: Patient ***denies convulsions or seizures Psychiatric: Patient ***denies memory problems Allergic/Immunologic: Patient ***denies recent allergic reaction(s) Hematologic/Lymphatic: Patient denies bleeding tendencies Endocrine: Patient ***denies heat/cold intolerance  GU: As per HPI.  OBJECTIVE There were no vitals filed for this visit. There is no height or weight on file to calculate BMI.  Physical Examination  Constitutional: ***No obvious distress; patient is ***non-toxic appearing   Cardiovascular: ***No visible lower extremity edema.  Respiratory: The patient does ***not have audible wheezing/stridor; respirations do ***not appear labored  Gastrointestinal: Abdomen ***non-distended Musculoskeletal: ***Normal ROM of UEs  Skin: ***No obvious rashes/open sores  Neurologic: CN 2-12 grossly ***intact Psychiatric: Answered questions ***appropriately  with ***normal affect  Hematologic/Lymphatic/Immunologic: ***No obvious bruises or sites of spontaneous bleeding  UA: {Desc; negative/positive:13464} *** WBC/hpf, *** RBC/hpf, bacteria (***) *** nitrites, *** leukocytes, *** blood PVR: *** ml  ASSESSMENT No diagnosis found. ***  Will plan for follow up in *** months or sooner if needed. Pt verbalized understanding and agreement. All questions were answered.  PLAN Advised the following: *** ***No follow-ups on file.  No orders of the defined types were placed in this encounter.   It has been explained that the patient is to follow regularly with their PCP in addition to all other providers involved in their care and to follow instructions provided by these respective offices. Patient advised to contact urology clinic if any urologic-pertaining questions, concerns, new symptoms or problems arise in the interim period.  There are no Patient Instructions on file for this visit.  Electronically signed by:  Donnita Falls, MSN, FNP-C, CUNP 09/11/2022 2:26 PM

## 2022-09-11 NOTE — Progress Notes (Signed)
Chief Complaint  Patient presents with   Follow-up    Blood pressure 139/64, pulse 80.  [name] presents today for routine follow up related to her pessary.   She uses a gelhorn 2 3/4 inch  Vaginal/bladder mucosal injury has healed well can barely rell where the defect was no new erosions She reports no vaginal discharge and no vaginal bleeding   Likert scale(1 not bothersome -5 very bothersome)  :  1  Exam reveals no undue vaginal mucosal pressure of breakdown, no discharge and no vaginal bleeding.  Vaginal Epithelial Abnormality Classification System:   0 0    No abnormalities 1    Epithelial erythema 2    Granulation tissue 3    Epithelial break or erosion, 1 cm or less 4    Epithelial break or erosion, 1 cm or greater  The pessary is removed, cleaned and replaced without difficulty.      ICD-10-CM   1. Pessary maintenance< Gelhorn pessary 2 3/4 inch, original fit 8/21  Z46.89    seems to be working ok for patient at this point, no bladder leakage with foley out and she is voiding ok, will keep in place    2. Vaginal erosion, due to the Milex #7 rim, 12-2 o'clcock  N89.8    healing well after repiar and removal of milex ring, Gelhorn is not causing pressure injury    3. Bladder injury, closed, initial encounter, due to erosion from the rim of Milex ring #7  S37.20XA    area is healing well and not leaking any urine at present with foley being out       [name] will be sen back in 1 months for continued follow up.  Lazaro Arms, MD  09/11/2022 11:33 AM

## 2022-09-12 DIAGNOSIS — N390 Urinary tract infection, site not specified: Secondary | ICD-10-CM | POA: Diagnosis not present

## 2022-09-12 LAB — CULTURE, BODY FLUID W GRAM STAIN -BOTTLE: Culture: NO GROWTH

## 2022-09-14 ENCOUNTER — Inpatient Hospital Stay: Payer: Medicare Other | Admitting: Physician Assistant

## 2022-09-14 DIAGNOSIS — R2689 Other abnormalities of gait and mobility: Secondary | ICD-10-CM | POA: Diagnosis not present

## 2022-09-14 DIAGNOSIS — M6281 Muscle weakness (generalized): Secondary | ICD-10-CM | POA: Diagnosis not present

## 2022-09-14 DIAGNOSIS — R339 Retention of urine, unspecified: Secondary | ICD-10-CM | POA: Diagnosis not present

## 2022-09-14 DIAGNOSIS — E039 Hypothyroidism, unspecified: Secondary | ICD-10-CM | POA: Diagnosis not present

## 2022-09-14 DIAGNOSIS — I10 Essential (primary) hypertension: Secondary | ICD-10-CM | POA: Diagnosis not present

## 2022-09-14 DIAGNOSIS — N189 Chronic kidney disease, unspecified: Secondary | ICD-10-CM | POA: Diagnosis not present

## 2022-09-14 DIAGNOSIS — N139 Obstructive and reflux uropathy, unspecified: Secondary | ICD-10-CM | POA: Diagnosis not present

## 2022-09-14 DIAGNOSIS — K7581 Nonalcoholic steatohepatitis (NASH): Secondary | ICD-10-CM | POA: Diagnosis not present

## 2022-09-14 DIAGNOSIS — R5381 Other malaise: Secondary | ICD-10-CM | POA: Diagnosis not present

## 2022-09-15 ENCOUNTER — Telehealth: Payer: Self-pay

## 2022-09-15 ENCOUNTER — Encounter: Payer: Self-pay | Admitting: Urology

## 2022-09-15 ENCOUNTER — Ambulatory Visit (INDEPENDENT_AMBULATORY_CARE_PROVIDER_SITE_OTHER): Payer: Medicare Other | Admitting: Urology

## 2022-09-15 VITALS — BP 121/73 | HR 57 | Temp 98.0°F

## 2022-09-15 DIAGNOSIS — E039 Hypothyroidism, unspecified: Secondary | ICD-10-CM | POA: Diagnosis not present

## 2022-09-15 DIAGNOSIS — N189 Chronic kidney disease, unspecified: Secondary | ICD-10-CM | POA: Diagnosis not present

## 2022-09-15 DIAGNOSIS — Z09 Encounter for follow-up examination after completed treatment for conditions other than malignant neoplasm: Secondary | ICD-10-CM

## 2022-09-15 DIAGNOSIS — N1832 Chronic kidney disease, stage 3b: Secondary | ICD-10-CM | POA: Diagnosis not present

## 2022-09-15 DIAGNOSIS — N993 Prolapse of vaginal vault after hysterectomy: Secondary | ICD-10-CM | POA: Diagnosis not present

## 2022-09-15 DIAGNOSIS — Z960 Presence of urogenital implants: Secondary | ICD-10-CM | POA: Diagnosis not present

## 2022-09-15 DIAGNOSIS — N398 Other specified disorders of urinary system: Secondary | ICD-10-CM | POA: Diagnosis not present

## 2022-09-15 DIAGNOSIS — I10 Essential (primary) hypertension: Secondary | ICD-10-CM | POA: Diagnosis not present

## 2022-09-15 DIAGNOSIS — Q794 Prune belly syndrome: Secondary | ICD-10-CM | POA: Diagnosis not present

## 2022-09-15 DIAGNOSIS — R5381 Other malaise: Secondary | ICD-10-CM | POA: Diagnosis not present

## 2022-09-15 DIAGNOSIS — N179 Acute kidney failure, unspecified: Secondary | ICD-10-CM

## 2022-09-15 DIAGNOSIS — R339 Retention of urine, unspecified: Secondary | ICD-10-CM

## 2022-09-15 DIAGNOSIS — K7581 Nonalcoholic steatohepatitis (NASH): Secondary | ICD-10-CM | POA: Diagnosis not present

## 2022-09-15 DIAGNOSIS — M6281 Muscle weakness (generalized): Secondary | ICD-10-CM | POA: Diagnosis not present

## 2022-09-15 LAB — BLADDER SCAN AMB NON-IMAGING

## 2022-09-15 NOTE — Telephone Encounter (Signed)
Patients daughter called and wanted to know if you could call her to discuss the appointment and next  steps. She advised she was unable to make the appointment today.     Thank you

## 2022-09-15 NOTE — Consult Note (Signed)
[facility] Customer service manager [facility]) Accountable Care Organization (ACO) [facility] Liaison Note  09/15/2022  [name] 1950-10-03 [id number]  Location: [facility] Liaison screened the patient remotely at [facility].  Insurance: [employer]   [name] is a 72 y.o. female who is a Primary Care Patient of Margo Aye, Kathleene Hazel, MD. The patient was screened for 7 and 30 day readmission hospitalization with noted high risk score for unplanned readmission risk with 2 IP/2 ED in 6 months.  The patient was assessed for potential [facility]) Care Management service needs for post hospital transition for care coordination. Review of patient's electronic medical record reveals patient was admitted with Renal Failure. Will make a referral based upon pt's high risk utilization and post hospital prevention readmissions for a RN care coordinator.     [facility] Care Management/Population Health does not replace or interfere with any arrangements made by the Inpatient Transition of Care team.   For questions contact:   Elliot Cousin, RN, BSN [facility] Liaison [place]   [facility]  Population Health Office Hours MTWF  8:00 am-6:00 pm Off on Thursday [phone] mobile [phone] [Office toll free line][facility] Office Hours are M-F 8:30 - 5 pm 24 hour nurse advise line [phone] Concierge  [email or web address]

## 2022-09-16 ENCOUNTER — Telehealth: Payer: Self-pay

## 2022-09-16 DIAGNOSIS — I10 Essential (primary) hypertension: Secondary | ICD-10-CM | POA: Diagnosis not present

## 2022-09-16 DIAGNOSIS — I129 Hypertensive chronic kidney disease with stage 1 through stage 4 chronic kidney disease, or unspecified chronic kidney disease: Secondary | ICD-10-CM | POA: Diagnosis not present

## 2022-09-16 DIAGNOSIS — R791 Abnormal coagulation profile: Secondary | ICD-10-CM | POA: Diagnosis not present

## 2022-09-16 DIAGNOSIS — N1831 Chronic kidney disease, stage 3a: Secondary | ICD-10-CM | POA: Diagnosis not present

## 2022-09-16 DIAGNOSIS — E559 Vitamin D deficiency, unspecified: Secondary | ICD-10-CM | POA: Diagnosis not present

## 2022-09-16 DIAGNOSIS — E119 Type 2 diabetes mellitus without complications: Secondary | ICD-10-CM | POA: Diagnosis not present

## 2022-09-16 DIAGNOSIS — N179 Acute kidney failure, unspecified: Secondary | ICD-10-CM

## 2022-09-16 DIAGNOSIS — D631 Anemia in chronic kidney disease: Secondary | ICD-10-CM | POA: Diagnosis not present

## 2022-09-16 LAB — URINALYSIS, ROUTINE W REFLEX MICROSCOPIC
Bilirubin, UA: NEGATIVE
Glucose, UA: NEGATIVE
Ketones, UA: NEGATIVE
Nitrite, UA: NEGATIVE
Protein,UA: NEGATIVE
Specific Gravity, UA: 1.025 (ref 1.005–1.030)
Urobilinogen, Ur: 0.2 mg/dL (ref 0.2–1.0)
pH, UA: 5 (ref 5.0–7.5)

## 2022-09-16 LAB — MICROSCOPIC EXAMINATION: Epithelial Cells (non renal): >10 /hpf — AB (ref 0–10)

## 2022-09-17 ENCOUNTER — Telehealth: Payer: Self-pay | Admitting: *Deleted

## 2022-09-17 NOTE — Progress Notes (Signed)
  Care Coordination  Outreach Note  09/17/2022 Name: [name] MRN: [id number] DOB: 28-Mar-1951   Care Coordination Outreach Attempts: An unsuccessful telephone outreach was attempted today to offer the patient information about available care coordination services.  Follow Up Plan:  Additional outreach attempts will be made to offer the patient care coordination information and services.   Encounter Outcome:  No Answer  Burman Nieves, CCMA Care Coordination Care Guide Direct Dial: [phone]

## 2022-09-17 NOTE — Progress Notes (Signed)
Pt returned call, currently at [facility] center, doesn't have timeframe of d/c to home - will defer referral for 2 weeks per pt

## 2022-09-18 ENCOUNTER — Other Ambulatory Visit (HOSPITAL_COMMUNITY): Payer: Self-pay | Admitting: Internal Medicine

## 2022-09-18 DIAGNOSIS — W19XXXA Unspecified fall, initial encounter: Secondary | ICD-10-CM | POA: Diagnosis not present

## 2022-09-18 DIAGNOSIS — R188 Other ascites: Secondary | ICD-10-CM

## 2022-09-18 DIAGNOSIS — D649 Anemia, unspecified: Secondary | ICD-10-CM | POA: Diagnosis not present

## 2022-09-18 DIAGNOSIS — S82831A Other fracture of upper and lower end of right fibula, initial encounter for closed fracture: Secondary | ICD-10-CM | POA: Diagnosis not present

## 2022-09-18 DIAGNOSIS — M79604 Pain in right leg: Secondary | ICD-10-CM | POA: Diagnosis not present

## 2022-09-21 ENCOUNTER — Encounter (HOSPITAL_COMMUNITY): Payer: Self-pay

## 2022-09-21 ENCOUNTER — Ambulatory Visit (HOSPITAL_COMMUNITY)
Admission: RE | Admit: 2022-09-21 | Discharge: 2022-09-21 | Disposition: A | Discharge status: Home / Self Care | Payer: Medicare Other | Source: Ambulatory Visit | Attending: Internal Medicine | Admitting: Internal Medicine

## 2022-09-21 ENCOUNTER — Ambulatory Visit (HOSPITAL_COMMUNITY)
Admission: RE | Admit: 2022-09-21 | Discharge: 2022-09-21 | Disposition: A | Discharge status: Home / Self Care | Payer: Medicare Other | Source: Ambulatory Visit | Attending: Urology | Admitting: Urology

## 2022-09-21 ENCOUNTER — Ambulatory Visit (HOSPITAL_COMMUNITY): Admission: RE | Admit: 2022-09-21 | Payer: Medicare Other | Source: Ambulatory Visit

## 2022-09-21 DIAGNOSIS — N2889 Other specified disorders of kidney and ureter: Secondary | ICD-10-CM | POA: Diagnosis not present

## 2022-09-21 DIAGNOSIS — N1832 Chronic kidney disease, stage 3b: Secondary | ICD-10-CM | POA: Insufficient documentation

## 2022-09-21 DIAGNOSIS — R188 Other ascites: Secondary | ICD-10-CM

## 2022-09-21 DIAGNOSIS — Z09 Encounter for follow-up examination after completed treatment for conditions other than malignant neoplasm: Secondary | ICD-10-CM | POA: Insufficient documentation

## 2022-09-21 DIAGNOSIS — N179 Acute kidney failure, unspecified: Secondary | ICD-10-CM | POA: Insufficient documentation

## 2022-09-21 DIAGNOSIS — N398 Other specified disorders of urinary system: Secondary | ICD-10-CM | POA: Insufficient documentation

## 2022-09-21 DIAGNOSIS — Z960 Presence of urogenital implants: Secondary | ICD-10-CM | POA: Diagnosis not present

## 2022-09-21 DIAGNOSIS — N993 Prolapse of vaginal vault after hysterectomy: Secondary | ICD-10-CM | POA: Insufficient documentation

## 2022-09-21 DIAGNOSIS — R339 Retention of urine, unspecified: Secondary | ICD-10-CM | POA: Insufficient documentation

## 2022-09-21 NOTE — Progress Notes (Signed)
Patient tolerated right sided paracentesis procedure well today and 2.5 Liters of clear yellow ascites removed. Patient verbalized understanding of discharge instructions and left via wheelchair with family at this time with no acute distress noted.

## 2022-09-21 NOTE — Progress Notes (Signed)
Please let patient know there is evidence of kidney thinning related to her ESRD - she has refused catheter management for urinary retention and has been made fully aware of and has accepted the risks of morbidity/mortality associated with that decision. No evidence of GU stones or masses.

## 2022-09-22 ENCOUNTER — Telehealth: Payer: Self-pay

## 2022-09-22 DIAGNOSIS — R5381 Other malaise: Secondary | ICD-10-CM | POA: Diagnosis not present

## 2022-09-22 DIAGNOSIS — N189 Chronic kidney disease, unspecified: Secondary | ICD-10-CM | POA: Diagnosis not present

## 2022-09-22 NOTE — Telephone Encounter (Signed)
-----   Message from [facility], LPN sent at 0/98/1191  8:15 AM EDT -----  ----- Message ----- From: Donnita Falls, FNP Sent: 09/21/2022   7:19 PM EDT To: [facility] [place] [facility]  Please let patient know there is evidence of kidney thinning related to her ESRD - she has refused catheter management for urinary retention and has been made fully aware of and has accepted the risks of morbidity/mortality associated with that decision. No evidence of GU stones or masses.

## 2022-09-22 NOTE — Telephone Encounter (Signed)
I called Rehabilitation center that patient lives and was transferred to the nurse station, but nobody picked up the phone. I will try calling the facility at another day.

## 2022-09-23 ENCOUNTER — Telehealth: Payer: Self-pay

## 2022-09-23 DIAGNOSIS — R2689 Other abnormalities of gait and mobility: Secondary | ICD-10-CM | POA: Diagnosis not present

## 2022-09-23 DIAGNOSIS — R339 Retention of urine, unspecified: Secondary | ICD-10-CM | POA: Diagnosis not present

## 2022-09-23 DIAGNOSIS — K7460 Unspecified cirrhosis of liver: Secondary | ICD-10-CM | POA: Diagnosis not present

## 2022-09-23 DIAGNOSIS — K7581 Nonalcoholic steatohepatitis (NASH): Secondary | ICD-10-CM | POA: Diagnosis not present

## 2022-09-23 DIAGNOSIS — N139 Obstructive and reflux uropathy, unspecified: Secondary | ICD-10-CM | POA: Diagnosis not present

## 2022-09-23 DIAGNOSIS — E039 Hypothyroidism, unspecified: Secondary | ICD-10-CM | POA: Diagnosis not present

## 2022-09-23 DIAGNOSIS — R5381 Other malaise: Secondary | ICD-10-CM | POA: Diagnosis not present

## 2022-09-23 DIAGNOSIS — N189 Chronic kidney disease, unspecified: Secondary | ICD-10-CM | POA: Diagnosis not present

## 2022-09-23 DIAGNOSIS — M6281 Muscle weakness (generalized): Secondary | ICD-10-CM | POA: Diagnosis not present

## 2022-09-23 DIAGNOSIS — I10 Essential (primary) hypertension: Secondary | ICD-10-CM | POA: Diagnosis not present

## 2022-09-23 DIAGNOSIS — Q794 Prune belly syndrome: Secondary | ICD-10-CM | POA: Diagnosis not present

## 2022-09-23 NOTE — Telephone Encounter (Signed)
I called patient and did not get patient, however did leave a detailed message on her answering machine about renal US results per  Evette Georges, FNP.

## 2022-09-23 NOTE — Telephone Encounter (Signed)
-----   Message from Hope Cobb, LPN sent at 09/22/2022  8:15 AM EDT -----  ----- Message ----- From: Larocco, Sarah C, FNP Sent: 09/21/2022   7:19 PM EDT To: [facility]  Please let patient know there is evidence of kidney thinning related to her ESRD - she has refused catheter management for urinary retention and has been made fully aware of and has accepted the risks of morbidity/mortality associated with that decision. No evidence of GU stones or masses.   

## 2022-09-24 DIAGNOSIS — K7460 Unspecified cirrhosis of liver: Secondary | ICD-10-CM | POA: Diagnosis not present

## 2022-09-24 DIAGNOSIS — K7581 Nonalcoholic steatohepatitis (NASH): Secondary | ICD-10-CM | POA: Diagnosis not present

## 2022-09-24 DIAGNOSIS — Z86010 Personal history of colonic polyps: Secondary | ICD-10-CM | POA: Diagnosis not present

## 2022-09-24 DIAGNOSIS — D696 Thrombocytopenia, unspecified: Secondary | ICD-10-CM | POA: Diagnosis not present

## 2022-09-28 DIAGNOSIS — D631 Anemia in chronic kidney disease: Secondary | ICD-10-CM | POA: Diagnosis not present

## 2022-09-28 DIAGNOSIS — N183 Chronic kidney disease, stage 3 unspecified: Secondary | ICD-10-CM | POA: Diagnosis not present

## 2022-09-28 DIAGNOSIS — E1122 Type 2 diabetes mellitus with diabetic chronic kidney disease: Secondary | ICD-10-CM | POA: Diagnosis not present

## 2022-09-28 DIAGNOSIS — I129 Hypertensive chronic kidney disease with stage 1 through stage 4 chronic kidney disease, or unspecified chronic kidney disease: Secondary | ICD-10-CM | POA: Diagnosis not present

## 2022-09-28 DIAGNOSIS — K7581 Nonalcoholic steatohepatitis (NASH): Secondary | ICD-10-CM | POA: Diagnosis not present

## 2022-09-28 DIAGNOSIS — K7460 Unspecified cirrhosis of liver: Secondary | ICD-10-CM | POA: Diagnosis not present

## 2022-09-29 DIAGNOSIS — K7460 Unspecified cirrhosis of liver: Secondary | ICD-10-CM | POA: Diagnosis not present

## 2022-09-29 DIAGNOSIS — L89159 Pressure ulcer of sacral region, unspecified stage: Secondary | ICD-10-CM | POA: Diagnosis not present

## 2022-09-29 DIAGNOSIS — Z79899 Other long term (current) drug therapy: Secondary | ICD-10-CM | POA: Diagnosis not present

## 2022-09-29 DIAGNOSIS — R296 Repeated falls: Secondary | ICD-10-CM | POA: Diagnosis not present

## 2022-09-29 DIAGNOSIS — K649 Unspecified hemorrhoids: Secondary | ICD-10-CM | POA: Diagnosis not present

## 2022-09-29 DIAGNOSIS — I129 Hypertensive chronic kidney disease with stage 1 through stage 4 chronic kidney disease, or unspecified chronic kidney disease: Secondary | ICD-10-CM | POA: Diagnosis not present

## 2022-09-29 DIAGNOSIS — W19XXXD Unspecified fall, subsequent encounter: Secondary | ICD-10-CM | POA: Diagnosis not present

## 2022-09-29 DIAGNOSIS — N1831 Chronic kidney disease, stage 3a: Secondary | ICD-10-CM | POA: Diagnosis not present

## 2022-09-29 DIAGNOSIS — B372 Candidiasis of skin and nail: Secondary | ICD-10-CM | POA: Diagnosis not present

## 2022-09-29 DIAGNOSIS — N189 Chronic kidney disease, unspecified: Secondary | ICD-10-CM | POA: Diagnosis not present

## 2022-09-29 DIAGNOSIS — S92901D Unspecified fracture of right foot, subsequent encounter for fracture with routine healing: Secondary | ICD-10-CM | POA: Diagnosis not present

## 2022-10-02 DIAGNOSIS — N1831 Chronic kidney disease, stage 3a: Secondary | ICD-10-CM | POA: Diagnosis not present

## 2022-10-02 DIAGNOSIS — E1122 Type 2 diabetes mellitus with diabetic chronic kidney disease: Secondary | ICD-10-CM | POA: Diagnosis not present

## 2022-10-02 DIAGNOSIS — K219 Gastro-esophageal reflux disease without esophagitis: Secondary | ICD-10-CM | POA: Diagnosis not present

## 2022-10-02 DIAGNOSIS — J309 Allergic rhinitis, unspecified: Secondary | ICD-10-CM | POA: Diagnosis not present

## 2022-10-02 DIAGNOSIS — E11319 Type 2 diabetes mellitus with unspecified diabetic retinopathy without macular edema: Secondary | ICD-10-CM | POA: Diagnosis not present

## 2022-10-02 DIAGNOSIS — E559 Vitamin D deficiency, unspecified: Secondary | ICD-10-CM | POA: Diagnosis not present

## 2022-10-02 DIAGNOSIS — M109 Gout, unspecified: Secondary | ICD-10-CM | POA: Diagnosis not present

## 2022-10-02 DIAGNOSIS — Z9181 History of falling: Secondary | ICD-10-CM | POA: Diagnosis not present

## 2022-10-02 DIAGNOSIS — E785 Hyperlipidemia, unspecified: Secondary | ICD-10-CM | POA: Diagnosis not present

## 2022-10-02 DIAGNOSIS — Z794 Long term (current) use of insulin: Secondary | ICD-10-CM | POA: Diagnosis not present

## 2022-10-02 DIAGNOSIS — S92901D Unspecified fracture of right foot, subsequent encounter for fracture with routine healing: Secondary | ICD-10-CM | POA: Diagnosis not present

## 2022-10-02 DIAGNOSIS — K7460 Unspecified cirrhosis of liver: Secondary | ICD-10-CM | POA: Diagnosis not present

## 2022-10-02 DIAGNOSIS — B372 Candidiasis of skin and nail: Secondary | ICD-10-CM | POA: Diagnosis not present

## 2022-10-02 DIAGNOSIS — K7581 Nonalcoholic steatohepatitis (NASH): Secondary | ICD-10-CM | POA: Diagnosis not present

## 2022-10-02 DIAGNOSIS — I129 Hypertensive chronic kidney disease with stage 1 through stage 4 chronic kidney disease, or unspecified chronic kidney disease: Secondary | ICD-10-CM | POA: Diagnosis not present

## 2022-10-02 DIAGNOSIS — K649 Unspecified hemorrhoids: Secondary | ICD-10-CM | POA: Diagnosis not present

## 2022-10-02 DIAGNOSIS — E039 Hypothyroidism, unspecified: Secondary | ICD-10-CM | POA: Diagnosis not present

## 2022-10-02 NOTE — Progress Notes (Signed)
  Care Coordination  Outreach Note  10/02/2022 Name: [name] MRN: [id number] DOB: Aug 19, 1950   Care Coordination Outreach Attempts: A second unsuccessful outreach was attempted today to offer the patient with information about available care coordination services.  Follow Up Plan:  Additional outreach attempts will be made to offer the patient care coordination information and services.   Encounter Outcome:  No Answer  Burman Nieves, CCMA Care Coordination Care Guide Direct Dial: [phone]

## 2022-10-02 NOTE — Progress Notes (Signed)
  Care Coordination   Note   10/02/2022 Name: [name] MRN: [id number] DOB: 12-Dec-1950  [name] is a 72 y.o. year old female who sees Margo Aye, Kathleene Hazel, MD for primary care. I reached out to Val Eagle by phone today to offer care coordination services.  Ms. Bayerl was given information about Care Coordination services today including:   The Care Coordination services include support from the care team which includes your Nurse Coordinator, Clinical Social Worker, or Pharmacist.  The Care Coordination team is here to help remove barriers to the health concerns and goals most important to you. Care Coordination services are voluntary, and the patient may decline or stop services at any time by request to their care team member.   Care Coordination Consent Status: Patient agreed to services and verbal consent obtained.   Follow up plan:  Telephone appointment with care coordination team member scheduled for:  10/05/2022  Encounter Outcome:  Pt. Scheduled from referral   Burman Nieves, [facility] Care Coordination Care Guide Direct Dial: [phone]

## 2022-10-05 ENCOUNTER — Ambulatory Visit: Payer: Self-pay | Admitting: *Deleted

## 2022-10-05 ENCOUNTER — Other Ambulatory Visit (HOSPITAL_COMMUNITY): Payer: Self-pay | Admitting: Gastroenterology

## 2022-10-05 ENCOUNTER — Encounter: Payer: Self-pay | Admitting: *Deleted

## 2022-10-05 DIAGNOSIS — L89159 Pressure ulcer of sacral region, unspecified stage: Secondary | ICD-10-CM | POA: Diagnosis not present

## 2022-10-05 DIAGNOSIS — R188 Other ascites: Secondary | ICD-10-CM

## 2022-10-05 NOTE — Patient Outreach (Signed)
  Care Coordination   10/05/2022 Name: [name] MRN: [id number] DOB: 1950-11-30   Care Coordination Outreach Attempts:  An unsuccessful telephone outreach was attempted for a scheduled appointment today. The home number today ws the [name]. Spoke with her social worker who confirmed patient discharged on 09/23/22 home  The [phone] was confirmed to be the home number  A voice message was left at this number with RN CM office number     Follow Up Plan:  Additional outreach attempts will be made to offer the patient care coordination information and services.   Encounter Outcome:  No Answer   Care Coordination Interventions:  Yes, provided    Estle Sabella L. Noelle Penner, RN, BSN, CCM [facility] Care Management Community Coordinator Office number [phone]

## 2022-10-06 ENCOUNTER — Inpatient Hospital Stay: Payer: Medicare Other | Attending: Physician Assistant

## 2022-10-06 DIAGNOSIS — E1122 Type 2 diabetes mellitus with diabetic chronic kidney disease: Secondary | ICD-10-CM | POA: Diagnosis not present

## 2022-10-06 DIAGNOSIS — E039 Hypothyroidism, unspecified: Secondary | ICD-10-CM | POA: Diagnosis not present

## 2022-10-06 DIAGNOSIS — B372 Candidiasis of skin and nail: Secondary | ICD-10-CM | POA: Diagnosis not present

## 2022-10-06 DIAGNOSIS — D631 Anemia in chronic kidney disease: Secondary | ICD-10-CM | POA: Diagnosis not present

## 2022-10-06 DIAGNOSIS — K649 Unspecified hemorrhoids: Secondary | ICD-10-CM | POA: Diagnosis not present

## 2022-10-06 DIAGNOSIS — K7460 Unspecified cirrhosis of liver: Secondary | ICD-10-CM | POA: Diagnosis not present

## 2022-10-06 DIAGNOSIS — D696 Thrombocytopenia, unspecified: Secondary | ICD-10-CM

## 2022-10-06 DIAGNOSIS — S92901D Unspecified fracture of right foot, subsequent encounter for fracture with routine healing: Secondary | ICD-10-CM | POA: Diagnosis not present

## 2022-10-06 DIAGNOSIS — E538 Deficiency of other specified B group vitamins: Secondary | ICD-10-CM | POA: Insufficient documentation

## 2022-10-06 DIAGNOSIS — N1832 Chronic kidney disease, stage 3b: Secondary | ICD-10-CM | POA: Diagnosis not present

## 2022-10-06 DIAGNOSIS — Z794 Long term (current) use of insulin: Secondary | ICD-10-CM | POA: Diagnosis not present

## 2022-10-06 DIAGNOSIS — D500 Iron deficiency anemia secondary to blood loss (chronic): Secondary | ICD-10-CM

## 2022-10-06 DIAGNOSIS — E11319 Type 2 diabetes mellitus with unspecified diabetic retinopathy without macular edema: Secondary | ICD-10-CM | POA: Diagnosis not present

## 2022-10-06 DIAGNOSIS — Z9181 History of falling: Secondary | ICD-10-CM | POA: Diagnosis not present

## 2022-10-06 DIAGNOSIS — J309 Allergic rhinitis, unspecified: Secondary | ICD-10-CM | POA: Diagnosis not present

## 2022-10-06 DIAGNOSIS — K7581 Nonalcoholic steatohepatitis (NASH): Secondary | ICD-10-CM | POA: Diagnosis not present

## 2022-10-06 DIAGNOSIS — E559 Vitamin D deficiency, unspecified: Secondary | ICD-10-CM | POA: Diagnosis not present

## 2022-10-06 DIAGNOSIS — K219 Gastro-esophageal reflux disease without esophagitis: Secondary | ICD-10-CM | POA: Diagnosis not present

## 2022-10-06 DIAGNOSIS — N1831 Chronic kidney disease, stage 3a: Secondary | ICD-10-CM | POA: Diagnosis not present

## 2022-10-06 DIAGNOSIS — E785 Hyperlipidemia, unspecified: Secondary | ICD-10-CM | POA: Diagnosis not present

## 2022-10-06 DIAGNOSIS — I129 Hypertensive chronic kidney disease with stage 1 through stage 4 chronic kidney disease, or unspecified chronic kidney disease: Secondary | ICD-10-CM | POA: Diagnosis not present

## 2022-10-06 DIAGNOSIS — M109 Gout, unspecified: Secondary | ICD-10-CM | POA: Diagnosis not present

## 2022-10-06 LAB — COMPREHENSIVE METABOLIC PANEL
ALT: 18 U/L (ref 0–44)
AST: 29 U/L (ref 15–41)
Albumin: 2.7 g/dL — ABNORMAL LOW (ref 3.5–5.0)
Alkaline Phosphatase: 130 U/L — ABNORMAL HIGH (ref 38–126)
Anion gap: 6 (ref 5–15)
BUN: 40 mg/dL — ABNORMAL HIGH (ref 8–23)
CO2: 27 mmol/L (ref 22–32)
Calcium: 8.1 mg/dL — ABNORMAL LOW (ref 8.9–10.3)
Chloride: 103 mmol/L (ref 98–111)
Creatinine, Ser: 2.03 mg/dL — ABNORMAL HIGH (ref 0.44–1.00)
GFR, Estimated: 26 mL/min — ABNORMAL LOW (ref >60)
Glucose, Bld: 244 mg/dL — ABNORMAL HIGH (ref 70–99)
Potassium: 3.8 mmol/L (ref 3.5–5.1)
Sodium: 136 mmol/L (ref 135–145)
Total Bilirubin: 1.6 mg/dL — ABNORMAL HIGH (ref 0.3–1.2)
Total Protein: 5.8 g/dL — ABNORMAL LOW (ref 6.5–8.1)

## 2022-10-06 LAB — CBC WITH DIFFERENTIAL/PLATELET
Abs Immature Granulocytes: 0.02 10*3/uL (ref 0.00–0.07)
Basophils Absolute: 0 10*3/uL (ref 0.0–0.1)
Basophils Relative: 0 %
Eosinophils Absolute: 0.3 10*3/uL (ref 0.0–0.5)
Eosinophils Relative: 6 %
HCT: 28.8 % — ABNORMAL LOW (ref 36.0–46.0)
Hemoglobin: 9.5 g/dL — ABNORMAL LOW (ref 12.0–15.0)
Immature Granulocytes: 0 %
Lymphocytes Relative: 15 %
Lymphs Abs: 0.8 10*3/uL (ref 0.7–4.0)
MCH: 33.7 pg (ref 26.0–34.0)
MCHC: 33 g/dL (ref 30.0–36.0)
MCV: 102.1 fL — ABNORMAL HIGH (ref 80.0–100.0)
Monocytes Absolute: 0.4 10*3/uL (ref 0.1–1.0)
Monocytes Relative: 7 %
Neutro Abs: 3.9 10*3/uL (ref 1.7–7.7)
Neutrophils Relative %: 72 %
Platelets: 55 10*3/uL — ABNORMAL LOW (ref 150–400)
RBC: 2.82 MIL/uL — ABNORMAL LOW (ref 3.87–5.11)
RDW: 15.9 % — ABNORMAL HIGH (ref 11.5–15.5)
WBC: 5.5 10*3/uL (ref 4.0–10.5)
nRBC: 0 % (ref 0.0–0.2)

## 2022-10-06 LAB — IRON AND TIBC
Iron: 71 ug/dL (ref 28–170)
Saturation Ratios: 36 % — ABNORMAL HIGH (ref 10.4–31.8)
TIBC: 200 ug/dL — ABNORMAL LOW (ref 250–450)
UIBC: 129 ug/dL

## 2022-10-06 LAB — FERRITIN: Ferritin: 117 ng/mL (ref 11–307)

## 2022-10-06 LAB — VITAMIN B12: Vitamin B-12: 1173 pg/mL — ABNORMAL HIGH (ref 180–914)

## 2022-10-07 ENCOUNTER — Telehealth: Payer: Self-pay | Admitting: *Deleted

## 2022-10-07 NOTE — Progress Notes (Signed)
  Care Coordination Note  10/07/2022 Name: [name] MRN: [id number] DOB: 02-27-1951  [name] is a 72 y.o. year old female who is a primary care patient of Margo Aye, Kathleene Hazel, MD and is actively engaged with the care management team. I reached out to Val Eagle by phone today to assist with re-scheduling an initial visit with the RN Case Manager  Follow up plan: Unsuccessful telephone outreach attempt made. A HIPAA compliant phone message was left for the patient providing contact information and requesting a return call.   [facility]  Care Coordination Care Guide  Direct Dial: [phone]

## 2022-10-07 NOTE — Progress Notes (Signed)
  Care Coordination Note  10/07/2022 Name: [name] MRN: [id number] DOB: 1950-08-31  [name] is a 72 y.o. year old female who is a primary care patient of Margo Aye, Kathleene Hazel, MD and is actively engaged with the care management team. I reached out to [name] by phone today to assist with re-scheduling an initial visit with the RN Case Manager  Follow up plan: Telephone appointment with care management team member scheduled for:10/22/22  Okay to speak with daughter per patient [name]  Care Coordination Care Guide  Direct Dial: [phone]

## 2022-10-08 ENCOUNTER — Ambulatory Visit (INDEPENDENT_AMBULATORY_CARE_PROVIDER_SITE_OTHER): Payer: Medicare Other | Admitting: Obstetrics & Gynecology

## 2022-10-08 ENCOUNTER — Encounter: Payer: Self-pay | Admitting: Obstetrics & Gynecology

## 2022-10-08 VITALS — BP 128/64 | HR 66

## 2022-10-08 DIAGNOSIS — E559 Vitamin D deficiency, unspecified: Secondary | ICD-10-CM | POA: Diagnosis not present

## 2022-10-08 DIAGNOSIS — N1831 Chronic kidney disease, stage 3a: Secondary | ICD-10-CM | POA: Diagnosis not present

## 2022-10-08 DIAGNOSIS — E1122 Type 2 diabetes mellitus with diabetic chronic kidney disease: Secondary | ICD-10-CM | POA: Diagnosis not present

## 2022-10-08 DIAGNOSIS — M109 Gout, unspecified: Secondary | ICD-10-CM | POA: Diagnosis not present

## 2022-10-08 DIAGNOSIS — I129 Hypertensive chronic kidney disease with stage 1 through stage 4 chronic kidney disease, or unspecified chronic kidney disease: Secondary | ICD-10-CM | POA: Diagnosis not present

## 2022-10-08 DIAGNOSIS — K7581 Nonalcoholic steatohepatitis (NASH): Secondary | ICD-10-CM | POA: Diagnosis not present

## 2022-10-08 DIAGNOSIS — Z4689 Encounter for fitting and adjustment of other specified devices: Secondary | ICD-10-CM

## 2022-10-08 DIAGNOSIS — N898 Other specified noninflammatory disorders of vagina: Secondary | ICD-10-CM | POA: Diagnosis not present

## 2022-10-08 DIAGNOSIS — K219 Gastro-esophageal reflux disease without esophagitis: Secondary | ICD-10-CM | POA: Diagnosis not present

## 2022-10-08 DIAGNOSIS — Z794 Long term (current) use of insulin: Secondary | ICD-10-CM | POA: Diagnosis not present

## 2022-10-08 DIAGNOSIS — E785 Hyperlipidemia, unspecified: Secondary | ICD-10-CM | POA: Diagnosis not present

## 2022-10-08 DIAGNOSIS — S3720XA Unspecified injury of bladder, initial encounter: Secondary | ICD-10-CM

## 2022-10-08 DIAGNOSIS — E11319 Type 2 diabetes mellitus with unspecified diabetic retinopathy without macular edema: Secondary | ICD-10-CM | POA: Diagnosis not present

## 2022-10-08 DIAGNOSIS — Z9181 History of falling: Secondary | ICD-10-CM | POA: Diagnosis not present

## 2022-10-08 DIAGNOSIS — B372 Candidiasis of skin and nail: Secondary | ICD-10-CM | POA: Diagnosis not present

## 2022-10-08 DIAGNOSIS — S92901D Unspecified fracture of right foot, subsequent encounter for fracture with routine healing: Secondary | ICD-10-CM | POA: Diagnosis not present

## 2022-10-08 DIAGNOSIS — K649 Unspecified hemorrhoids: Secondary | ICD-10-CM | POA: Diagnosis not present

## 2022-10-08 DIAGNOSIS — K7460 Unspecified cirrhosis of liver: Secondary | ICD-10-CM | POA: Diagnosis not present

## 2022-10-08 DIAGNOSIS — E039 Hypothyroidism, unspecified: Secondary | ICD-10-CM | POA: Diagnosis not present

## 2022-10-08 DIAGNOSIS — J309 Allergic rhinitis, unspecified: Secondary | ICD-10-CM | POA: Diagnosis not present

## 2022-10-08 LAB — METHYLMALONIC ACID, SERUM: Methylmalonic Acid, Quantitative: 509 nmol/L — ABNORMAL HIGH (ref 0–378)

## 2022-10-08 NOTE — Progress Notes (Signed)
Chief Complaint  Patient presents with   Pessary Check    Blood pressure 128/64, pulse 66.  [name] presents today for routine follow up related to her pessary.   She uses a Gelhorn 2 3/4 inch She reports no vaginal discharge and no vaginal bleeding   Likert scale(1 not bothersome -5 very bothersome)  :  1  Exam reveals no undue vaginal mucosal pressure of breakdown, no discharge and no vaginal bleeding.  Vaginal Epithelial Abnormality Classification System:   0 0    No abnormalities 1    Epithelial erythema 2    Granulation tissue 3    Epithelial break or erosion, 1 cm or less 4    Epithelial break or erosion, 1 cm or greater  The pessary is removed, cleaned and replaced without difficulty.      ICD-10-CM   1. Pessary maintenance< Gelhorn pessary 2 3/4 inch, original fit 8/21  Z46.89     2. Vaginal erosion, due to the Milex #7 rim, 12-2 o'clcock: healed  N89.8     3. Bladder injury, closed, initial encounter, due to erosion from the rim of Milex ring #7: no ongoing leakage  S37.20XA        [name] will be sen back in 3 months for continued follow up.  Lazaro Arms, MD  10/08/2022 2:46 PM

## 2022-10-09 ENCOUNTER — Encounter (HOSPITAL_COMMUNITY): Payer: Self-pay

## 2022-10-09 ENCOUNTER — Ambulatory Visit (HOSPITAL_COMMUNITY)
Admission: RE | Admit: 2022-10-09 | Discharge: 2022-10-09 | Disposition: A | Discharge status: Home / Self Care | Payer: Medicare Other | Source: Ambulatory Visit | Attending: Gastroenterology | Admitting: Gastroenterology

## 2022-10-09 DIAGNOSIS — R188 Other ascites: Secondary | ICD-10-CM | POA: Diagnosis not present

## 2022-10-09 DIAGNOSIS — S92901D Unspecified fracture of right foot, subsequent encounter for fracture with routine healing: Secondary | ICD-10-CM | POA: Diagnosis not present

## 2022-10-09 MED ORDER — ALBUMIN HUMAN 25 % IV SOLN
12.5000 g | Freq: Once | INTRAVENOUS | Status: AC
  Administered 2022-10-09: 25 g via INTRAVENOUS

## 2022-10-09 MED ORDER — ALBUMIN HUMAN 25 % IV SOLN
INTRAVENOUS | Status: AC
  Filled 2022-10-09: qty 100

## 2022-10-09 NOTE — Progress Notes (Signed)
Pt transported to [place] [facility] per WC in no obvious distress. Paracentesis explained, consent obtained. Prepped and draped in sterile manner. Access obtainedunder US at RLQ, clear amber serous fluid retrieved under vacutainer suction. 2L total out. Tolerated well. IV access obtained for albumin admin.Accses removed, dressed with dermabond and bandage, no bleeding or hematoma present. Transported to waiting area per [facility] and DCd to care of family.

## 2022-10-10 DIAGNOSIS — K219 Gastro-esophageal reflux disease without esophagitis: Secondary | ICD-10-CM | POA: Diagnosis not present

## 2022-10-10 DIAGNOSIS — E785 Hyperlipidemia, unspecified: Secondary | ICD-10-CM | POA: Diagnosis not present

## 2022-10-10 DIAGNOSIS — K7581 Nonalcoholic steatohepatitis (NASH): Secondary | ICD-10-CM | POA: Diagnosis not present

## 2022-10-10 DIAGNOSIS — M109 Gout, unspecified: Secondary | ICD-10-CM | POA: Diagnosis not present

## 2022-10-10 DIAGNOSIS — Z794 Long term (current) use of insulin: Secondary | ICD-10-CM | POA: Diagnosis not present

## 2022-10-10 DIAGNOSIS — K7460 Unspecified cirrhosis of liver: Secondary | ICD-10-CM | POA: Diagnosis not present

## 2022-10-10 DIAGNOSIS — N1831 Chronic kidney disease, stage 3a: Secondary | ICD-10-CM | POA: Diagnosis not present

## 2022-10-10 DIAGNOSIS — B372 Candidiasis of skin and nail: Secondary | ICD-10-CM | POA: Diagnosis not present

## 2022-10-10 DIAGNOSIS — K649 Unspecified hemorrhoids: Secondary | ICD-10-CM | POA: Diagnosis not present

## 2022-10-10 DIAGNOSIS — J309 Allergic rhinitis, unspecified: Secondary | ICD-10-CM | POA: Diagnosis not present

## 2022-10-10 DIAGNOSIS — E559 Vitamin D deficiency, unspecified: Secondary | ICD-10-CM | POA: Diagnosis not present

## 2022-10-10 DIAGNOSIS — E1122 Type 2 diabetes mellitus with diabetic chronic kidney disease: Secondary | ICD-10-CM | POA: Diagnosis not present

## 2022-10-10 DIAGNOSIS — Z9181 History of falling: Secondary | ICD-10-CM | POA: Diagnosis not present

## 2022-10-10 DIAGNOSIS — S92901D Unspecified fracture of right foot, subsequent encounter for fracture with routine healing: Secondary | ICD-10-CM | POA: Diagnosis not present

## 2022-10-10 DIAGNOSIS — E11319 Type 2 diabetes mellitus with unspecified diabetic retinopathy without macular edema: Secondary | ICD-10-CM | POA: Diagnosis not present

## 2022-10-10 DIAGNOSIS — E039 Hypothyroidism, unspecified: Secondary | ICD-10-CM | POA: Diagnosis not present

## 2022-10-10 DIAGNOSIS — I129 Hypertensive chronic kidney disease with stage 1 through stage 4 chronic kidney disease, or unspecified chronic kidney disease: Secondary | ICD-10-CM | POA: Diagnosis not present

## 2022-10-11 DIAGNOSIS — K649 Unspecified hemorrhoids: Secondary | ICD-10-CM | POA: Diagnosis not present

## 2022-10-11 DIAGNOSIS — E785 Hyperlipidemia, unspecified: Secondary | ICD-10-CM | POA: Diagnosis not present

## 2022-10-11 DIAGNOSIS — S92901D Unspecified fracture of right foot, subsequent encounter for fracture with routine healing: Secondary | ICD-10-CM | POA: Diagnosis not present

## 2022-10-11 DIAGNOSIS — Z9181 History of falling: Secondary | ICD-10-CM | POA: Diagnosis not present

## 2022-10-11 DIAGNOSIS — J309 Allergic rhinitis, unspecified: Secondary | ICD-10-CM | POA: Diagnosis not present

## 2022-10-11 DIAGNOSIS — E559 Vitamin D deficiency, unspecified: Secondary | ICD-10-CM | POA: Diagnosis not present

## 2022-10-11 DIAGNOSIS — K7460 Unspecified cirrhosis of liver: Secondary | ICD-10-CM | POA: Diagnosis not present

## 2022-10-11 DIAGNOSIS — E1122 Type 2 diabetes mellitus with diabetic chronic kidney disease: Secondary | ICD-10-CM | POA: Diagnosis not present

## 2022-10-11 DIAGNOSIS — E11319 Type 2 diabetes mellitus with unspecified diabetic retinopathy without macular edema: Secondary | ICD-10-CM | POA: Diagnosis not present

## 2022-10-11 DIAGNOSIS — B372 Candidiasis of skin and nail: Secondary | ICD-10-CM | POA: Diagnosis not present

## 2022-10-11 DIAGNOSIS — K7581 Nonalcoholic steatohepatitis (NASH): Secondary | ICD-10-CM | POA: Diagnosis not present

## 2022-10-11 DIAGNOSIS — Z794 Long term (current) use of insulin: Secondary | ICD-10-CM | POA: Diagnosis not present

## 2022-10-11 DIAGNOSIS — K219 Gastro-esophageal reflux disease without esophagitis: Secondary | ICD-10-CM | POA: Diagnosis not present

## 2022-10-11 DIAGNOSIS — N1831 Chronic kidney disease, stage 3a: Secondary | ICD-10-CM | POA: Diagnosis not present

## 2022-10-11 DIAGNOSIS — E039 Hypothyroidism, unspecified: Secondary | ICD-10-CM | POA: Diagnosis not present

## 2022-10-11 DIAGNOSIS — I129 Hypertensive chronic kidney disease with stage 1 through stage 4 chronic kidney disease, or unspecified chronic kidney disease: Secondary | ICD-10-CM | POA: Diagnosis not present

## 2022-10-11 DIAGNOSIS — M109 Gout, unspecified: Secondary | ICD-10-CM | POA: Diagnosis not present

## 2022-10-12 DIAGNOSIS — Z515 Encounter for palliative care: Secondary | ICD-10-CM | POA: Diagnosis not present

## 2022-10-12 DIAGNOSIS — K766 Portal hypertension: Secondary | ICD-10-CM | POA: Diagnosis not present

## 2022-10-12 DIAGNOSIS — K7581 Nonalcoholic steatohepatitis (NASH): Secondary | ICD-10-CM | POA: Diagnosis not present

## 2022-10-12 DIAGNOSIS — N1832 Chronic kidney disease, stage 3b: Secondary | ICD-10-CM | POA: Diagnosis not present

## 2022-10-12 NOTE — Progress Notes (Deleted)
NO SHOW

## 2022-10-13 ENCOUNTER — Encounter (HOSPITAL_COMMUNITY): Payer: Self-pay | Admitting: Hematology

## 2022-10-13 ENCOUNTER — Inpatient Hospital Stay: Payer: Medicare Other | Admitting: Physician Assistant

## 2022-10-14 DIAGNOSIS — K649 Unspecified hemorrhoids: Secondary | ICD-10-CM | POA: Diagnosis not present

## 2022-10-14 DIAGNOSIS — K7581 Nonalcoholic steatohepatitis (NASH): Secondary | ICD-10-CM | POA: Diagnosis not present

## 2022-10-14 DIAGNOSIS — E785 Hyperlipidemia, unspecified: Secondary | ICD-10-CM | POA: Diagnosis not present

## 2022-10-14 DIAGNOSIS — I129 Hypertensive chronic kidney disease with stage 1 through stage 4 chronic kidney disease, or unspecified chronic kidney disease: Secondary | ICD-10-CM | POA: Diagnosis not present

## 2022-10-14 DIAGNOSIS — J309 Allergic rhinitis, unspecified: Secondary | ICD-10-CM | POA: Diagnosis not present

## 2022-10-14 DIAGNOSIS — K219 Gastro-esophageal reflux disease without esophagitis: Secondary | ICD-10-CM | POA: Diagnosis not present

## 2022-10-14 DIAGNOSIS — N1831 Chronic kidney disease, stage 3a: Secondary | ICD-10-CM | POA: Diagnosis not present

## 2022-10-14 DIAGNOSIS — E559 Vitamin D deficiency, unspecified: Secondary | ICD-10-CM | POA: Diagnosis not present

## 2022-10-14 DIAGNOSIS — M109 Gout, unspecified: Secondary | ICD-10-CM | POA: Diagnosis not present

## 2022-10-14 DIAGNOSIS — Z794 Long term (current) use of insulin: Secondary | ICD-10-CM | POA: Diagnosis not present

## 2022-10-14 DIAGNOSIS — K7460 Unspecified cirrhosis of liver: Secondary | ICD-10-CM | POA: Diagnosis not present

## 2022-10-14 DIAGNOSIS — Z9181 History of falling: Secondary | ICD-10-CM | POA: Diagnosis not present

## 2022-10-14 DIAGNOSIS — E11319 Type 2 diabetes mellitus with unspecified diabetic retinopathy without macular edema: Secondary | ICD-10-CM | POA: Diagnosis not present

## 2022-10-14 DIAGNOSIS — E1122 Type 2 diabetes mellitus with diabetic chronic kidney disease: Secondary | ICD-10-CM | POA: Diagnosis not present

## 2022-10-14 DIAGNOSIS — B372 Candidiasis of skin and nail: Secondary | ICD-10-CM | POA: Diagnosis not present

## 2022-10-14 DIAGNOSIS — E039 Hypothyroidism, unspecified: Secondary | ICD-10-CM | POA: Diagnosis not present

## 2022-10-14 DIAGNOSIS — S92901D Unspecified fracture of right foot, subsequent encounter for fracture with routine healing: Secondary | ICD-10-CM | POA: Diagnosis not present

## 2022-10-14 NOTE — Progress Notes (Signed)
[facility] [address], [place] [address]   CLINIC:  Medical Oncology/Hematology  PCP:  Benita Stabile, MD [address] [name] [address] [phone]   REASON FOR VISIT:  Follow-up for thrombocytopenia and iron deficiency anemia in the setting of underlying cirrhosis   CURRENT THERAPY: Intermittent IV iron infusions (last Feraheme on 02/08/2020)  INTERVAL HISTORY:   [name] 72 y.o. female returns for routine follow-up of her thrombocytopenia and iron deficiency anemia in the setting of underlying liver cirrhosis.  She was last seen by Rojelio Brenner PA-C on 03/09/2022.   Since her last visit, she has been hospitalized twice: - 08/25/2022 through 08/28/2022: AKI on CKD, discharged to SNF. - 09/03/2022 through 09/10/2022: AKI on CKD, liver cirrhosis with moderate ascites.  During this hospitalization, she had severe thrombocytopenia with platelets as low as 30 on 09/05/2022 (usual baseline platelets 40-60).  At today's visit, she reports feeling fair, but weak.  No recent hospitalizations, surgeries, or changes in baseline health status.   She admits to easy bruising, but denies any petechial rash.   She has not noticed any blood loss such as epistaxis, bleeding gums, hematemesis, hematochezia, melena, or hematuria.  She has some worsening fatigue following her hospital stays.   She has not had any pica, headaches, chest pain, or syncope.  She reports shortness of breath associated with "fluid building up in her stomach" that is improved after she has paracentesis.   She has 75% energy and 100% appetite. She endorses that she is maintaining a stable weight.   ASSESSMENT & PLAN:  1.  Moderate thrombocytopenia: - Had mild thrombocytopenia (platelets >100) since 2015.  Platelets have been <100 since 2019.  Baseline platelets over the past 3 years have been 40,000 to 60,000. - She has NASH with cirrhosis and splenomegaly. - CT abdomen/pelvis (renal  protocol) on 09/04/2022 showed hepatic cirrhosis with mild splenomegaly and ascites. - DIFFERENTIAL DIAGNOSIS favors thrombocytopenia secondary to splenomegaly, less likely to be immune mediated thrombocytopenia or possible MDS. - Nutritional deficiency work-up was negative.  ANA was 1:80 speckled pattern positive, although weakly.  She does not have any clinical signs or symptoms of lupus.  We considered it likely to be false-positive. - Review of most recent labs (10/06/2022): Platelets 55, at baseline.  Normal WBC and differential. - Denies any current signs or symptoms of bleeding does have some mild easy bruising without petechial rash    - PLAN:   Platelets remained stable at baseline.  No indication for treatment at this time.  Continue monitoring with CBC at least once every 6 months.   2.  Normocytic anemia: - Anemia secondary to iron deficiency and anemia of chronic disease.  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 - She denies any gross blood loss, rectal hemorrhage or melena      - Unable to tolerate oral iron due to GI upset - Most recent IV iron was Feraheme on 02/05/2020 without much improvement in energy - EGD (09/26/2021): Large esophageal varices, partially eradicated with banding.  Mild portal hypertensive gastropathy. - Repeat EGD (10/31/2021): Large varices with banding x 5, partially eradicated varices.  Mild portal hypertensive gastropathy. - Most recent labs (10/06/2022): Hgb 9.5/MCV 102.1, ferritin 117, iron saturation 36%.  Creatinine 2.03/GFR 26. - PLAN:   No indication for IV iron at this time.  We will start her on Retacrit 10,000 units every 2 weeks. Patient has some hesitation to start Retacrit due  to concern about possible side effects.  She would like to discuss this further with her nephrologist (Dr. Kathe Mariner) before proceeding. We will tentatively schedule her for first Retacrit injection next week, but she has been given instructions to call and cancel this  appointment if she decides to refuse ESA at this time. We will also reach out to her nephrologist regarding the above. - RTC 4 to 6 weeks for follow-up on new start Retacrit  3.  B12 deficiency - Patient was started on vitamin B12 supplementation in September 2022 due to elevated methylmalonic acid.  Stopped by endocrinology in November 2023. - Most recent labs (10/06/2022): Vitamin B12 elevated at 1173, MMA elevated 509.  Elevation in both B12 and MMA may be due to worsening CKD. - PLAN: No indication for B12 supplementation at this time.  Will recheck in 6 to 12 months.  PLAN SUMMARY: >> CBC + Retacrit 10,000 units every 2 weeks (starting next week) >> Same-day labs (CBC/D, ferritin, iron/TIBC, CMP) + injection + OFFICE visit in 4 to 6 weeks (same day as third scheduled Retacrit injection)  **The above plan subject to change, since patient is not sure that she wants to proceed with Retacrit.  We will reach out to patient's nephrologist per her request.     REVIEW OF SYSTEMS:   Review of Systems  Constitutional:  Positive for fatigue. Negative for appetite change, chills, diaphoresis, fever and unexpected weight change.  HENT:   Negative for lump/mass and nosebleeds.   Eyes:  Negative for eye problems.  Respiratory:  Negative for cough, hemoptysis and shortness of breath.   Cardiovascular:  Negative for chest pain, leg swelling and palpitations.  Gastrointestinal:  Negative for abdominal pain, blood in stool, constipation, diarrhea, nausea and vomiting.  Genitourinary:  Negative for hematuria.   Skin: Negative.   Neurological:  Positive for dizziness and numbness. Negative for headaches and light-headedness.  Hematological:  Does not bruise/bleed easily.  Psychiatric/Behavioral:  Positive for sleep disturbance.      PHYSICAL EXAM:  ECOG PERFORMANCE STATUS: 3 - Symptomatic, >50% confined to bed  Vitals:   10/15/22 0910  BP: (!) 128/58  Pulse: 73  Resp: 16  Temp: 98 F (36.7 C)   SpO2: 98%   Filed Weights   10/15/22 0910  Weight: 199 lb 4.7 oz (90.4 kg)   Physical Exam Constitutional:      Appearance: Normal appearance. She is obese.  Cardiovascular:     Heart sounds: Normal heart sounds.  Pulmonary:     Breath sounds: Normal breath sounds.  Musculoskeletal:     Right lower leg: Edema present.     Left lower leg: Edema present.  Neurological:     General: No focal deficit present.     Mental Status: Mental status is at baseline.  Psychiatric:        Behavior: Behavior normal. Behavior is cooperative.     PAST MEDICAL/SURGICAL HISTORY:  Past Medical History:  Diagnosis Date   Alkaline phosphatase elevation    Anemia    ASD (atrial septal defect)    Cataract    both eyes hx of   Chest pain    03-17-2013 last chest pain   Chronic kidney disease    Stage III kidney disease   Cirrhosis (HCC)    Depression    Diabetes mellitus type II    Family history of adverse reaction to anesthesia    Son hard to wake up   Fatigue    GERD (  gastroesophageal reflux disease)    Gout    Headache(784.0)    occasional   Hyperlipidemia    Hypertension    Hypothyroidism    Neuropathy    Osteopenia    Peripheral neuropathy    Presence of pessary    Retinopathy    Past Surgical History:  Procedure Laterality Date   APPENDECTOMY     CATARACT EXTRACTION     CERVICAL SPINE SURGERY  2006   CHOLECYSTECTOMY N/A 04/07/2013   Procedure: LAPAROSCOPIC CHOLECYSTECTOMY WITH INTRAOPERATIVE CHOLANGIOGRAM;  Surgeon: Robyne Askew, MD;  Location: [facility];  Service: General;  Laterality: N/A;   COMBINED HYSTERECTOMY VAGINAL / OOPHORECTOMY / A&P REPAIR  1987   Unilateral oophorectomy, h/o uterine prolapse has right ovary   ERCP N/A 04/06/2013   Procedure: ENDOSCOPIC RETROGRADE CHOLANGIOPANCREATOGRAPHY (ERCP);  Surgeon: Theda Belfast, MD;  Location: [facility];  Service: Endoscopy;  Laterality: N/A;   ESOPHAGEAL BANDING N/A 09/26/2021   Procedure: ESOPHAGEAL BANDING;   Surgeon: Jeani Hawking, MD;  Location: [facility];  Service: Gastroenterology;  Laterality: N/A;   ESOPHAGEAL BANDING N/A 10/31/2021   Procedure: ESOPHAGEAL BANDING;  Surgeon: Jeani Hawking, MD;  Location: [facility];  Service: Gastroenterology;  Laterality: N/A;   ESOPHAGOGASTRODUODENOSCOPY (EGD) WITH PROPOFOL N/A 08/16/2020   Procedure: ESOPHAGOGASTRODUODENOSCOPY (EGD) WITH PROPOFOL;  Surgeon: Jeani Hawking, MD;  Location: [facility];  Service: Endoscopy;  Laterality: N/A;   ESOPHAGOGASTRODUODENOSCOPY (EGD) WITH PROPOFOL N/A 09/26/2021   Procedure: ESOPHAGOGASTRODUODENOSCOPY (EGD) WITH PROPOFOL;  Surgeon: Jeani Hawking, MD;  Location: [facility];  Service: Gastroenterology;  Laterality: N/A;   ESOPHAGOGASTRODUODENOSCOPY (EGD) WITH PROPOFOL N/A 10/31/2021   Procedure: ESOPHAGOGASTRODUODENOSCOPY (EGD) WITH PROPOFOL;  Surgeon: Jeani Hawking, MD;  Location: [facility];  Service: Gastroenterology;  Laterality: N/A;   EUS N/A 03/31/2013   Procedure: UPPER ENDOSCOPIC ULTRASOUND (EUS) LINEAR;  Surgeon: Theda Belfast, MD;  Location: [facility];  Service: Endoscopy;  Laterality: N/A;   REFRACTIVE SURGERY     SPINAL FUSION     c4-c7   TONSILLECTOMY  age 69    SOCIAL HISTORY:  Social History   Socioeconomic History   Marital status: Divorced    Spouse name: Not on file   Number of children: 3   Years of education: 12   Highest education level: Not on file  Occupational History   Occupation: [employer]-Retired    Associate Professor: [employer]  Tobacco Use   Smoking status: Never   Smokeless tobacco: Never  Vaping Use   Vaping status: Never Used  Substance and Sexual Activity   Alcohol use: No   Drug use: No   Sexual activity: Not Currently    Birth control/protection: Surgical    Comment: hyst  Other Topics Concern   Not on file  Social History Narrative   Divorced   Lives alone. Reports that her son recently moved out after living with her for a long time. She reports that  she is happy to be living alone and feels like she was enabling his behavior. Reports that he had a history of drug use.   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    No pets.   Eats all food groups.    Wears seat belt.    Lives in house.    Smoke detectors.    Social Determinants of Health   Financial Resource Strain: Medium Risk (10/30/2019)   Overall Financial Resource Strain (  CARDIA)    Difficulty of Paying Living Expenses: Somewhat hard  Food Insecurity: No Food Insecurity (09/03/2022)   Hunger Vital Sign    Worried About Running Out of Food in the Last Year: Never true    Ran Out of Food in the Last Year: Never true  Transportation Needs: No Transportation Needs (09/03/2022)   PRAPARE - Administrator, Civil Service (Medical): No    Lack of Transportation (Non-Medical): No  Physical Activity: Inactive (10/30/2019)   Exercise Vital Sign    Days of Exercise per Week: 0 days    Minutes of Exercise per Session: 0 min  Stress: No Stress Concern Present (10/30/2019)   [employer] of Occupational Health - Occupational Stress Questionnaire    Feeling of Stress : Not at all  Social Connections: Moderately Integrated (10/30/2019)   Social Connection and Isolation Panel [NHANES]    Frequency of Communication with Friends and Family: More than three times a week    Frequency of Social Gatherings with Friends and Family: Three times a week    Attends Religious Services: More than 4 times per year    Active Member of Clubs or Organizations: Yes    Attends Banker Meetings: 1 to 4 times per year    Marital Status: Divorced  Intimate Partner Violence: Not At Risk (09/03/2022)   Humiliation, Afraid, Rape, and Kick questionnaire    Fear of Current or Ex-Partner: No    Emotionally Abused: No    Physically Abused: No    Sexually Abused: No    FAMILY HISTORY:  Family History  Problem Relation Age of Onset    COPD Mother    Lung cancer Father    Diabetes Sister    Cataracts Sister    Insulin resistance Daughter    Insulin resistance Son     CURRENT MEDICATIONS:  Outpatient Encounter Medications as of 10/15/2022  Medication Sig   acetaminophen (TYLENOL) 325 MG tablet Take 2 tablets (650 mg total) by mouth every 6 (six) hours as needed for mild pain (or Fever >/= 101).   allopurinol (ZYLOPRIM) 100 MG tablet Take 100 mg by mouth every evening.    insulin regular (NOVOLIN R) 100 units/mL injection Inject 5-8 Units into the skin 3 (three) times daily before meals. Sliding scale 0-250=0 251-300=5 301-350=6 351-400=7units 401-450 8 units   levothyroxine (SYNTHROID) 125 MCG tablet 1 tablet in the morning on an empty stomach Orally Once a day for 30 day(s)   NOVOLIN N RELION 100 UNIT/ML injection Inject 14 Units into the skin 2 (two) times daily.   ONETOUCH ULTRA test strip    propranolol (INDERAL) 40 MG tablet Take 0.5 tablets (20 mg total) by mouth daily.   sodium bicarbonate 650 MG tablet Take 2 tablets (1,300 mg total) by mouth 2 (two) times daily.   Vitamin D, Ergocalciferol, (DRISDOL) 1.25 MG (50000 UT) CAPS capsule Take 50,000 Units by mouth every Saturday.   [DISCONTINUED] cholecalciferol (VITAMIN D3) 25 MCG (1000 UNIT) tablet Take 1,000 Units by mouth once a week. Wednesday weekly   [DISCONTINUED] midodrine (PROAMATINE) 10 MG tablet Take 1 tablet (10 mg total) by mouth 3 (three) times daily with meals.   [DISCONTINUED] neomycin-polymyxin b-dexamethasone (MAXITROL) 3.5-10000-0.1 OINT Place into the right eye 2 (two) times daily.   No facility-administered encounter medications on file as of 10/15/2022.    ALLERGIES:  Allergies  Allergen Reactions   Sulfamethoxazole-Trimethoprim Nausea And Vomiting and Nausea Only    Other Reaction(s): Dizziness  Atorvastatin     arthralgia/myalgia  Other Reaction(s): arthralgia/myalgia    arthralgia/myalgia   Ciprofloxacin     severe yeast  infection  Other Reaction(s): severe yeast infection    severe yeast infection   Codeine     Other Reaction(s): Unknown-Unspecified   Colesevelam     arthralgia/myalgia  Other Reaction(s): arthralgia/myalgia    arthralgia/myalgia   Ezetimibe     arthralgia/myalgia  Other Reaction(s): Unknown    arthralgia/myalgia   Lisinopril Cough   Losartan Potassium Cough   Lovastatin     arthralgia/myalgia  Other Reaction(s): arthralgia/myalgia    arthralgia/myalgia   Pravastatin     Other Reaction(s): arthralgia/myalgia   Rosuvastatin     arthralgia/myalgia  Other Reaction(s): Muscle Pain  Other Reaction(s): arthralgia/myalgia    REACTION: diarrhea    arthralgia/myalgia   Statins Other (See Comments)    arthralgia/myalgia   Dilaudid [Hydromorphone Hcl] Nausea And Vomiting    After one vomiting episode no further vomiting   Hydromorphone Nausea And Vomiting and Nausea Only    Other Reaction(s): GI Intolerance    After one vomiting episode no further vomiting    LABORATORY DATA:  I have reviewed the labs as listed.  CBC    Component Value Date/Time   WBC 5.5 10/06/2022 1045   RBC 2.82 (L) 10/06/2022 1045   HGB 9.5 (L) 10/06/2022 1045   HCT 28.8 (L) 10/06/2022 1045   PLT 55 (L) 10/06/2022 1045   MCV 102.1 (H) 10/06/2022 1045   MCH 33.7 10/06/2022 1045   MCHC 33.0 10/06/2022 1045   RDW 15.9 (H) 10/06/2022 1045   LYMPHSABS 0.8 10/06/2022 1045   MONOABS 0.4 10/06/2022 1045   EOSABS 0.3 10/06/2022 1045   BASOSABS 0.0 10/06/2022 1045      Latest Ref Rng & Units 10/06/2022   10:45 AM 09/10/2022    4:29 AM 09/09/2022    4:10 AM  CMP  Glucose 70 - 99 mg/dL 371  062  694   BUN 8 - 23 mg/dL 40  72  73   Creatinine 0.44 - 1.00 mg/dL 8.54  6.27  0.35   Sodium 135 - 145 mmol/L 136  130  129   Potassium 3.5 - 5.1 mmol/L 3.8  4.0  4.1   Chloride 98 - 111 mmol/L 103  103  102   CO2 22 - 32 mmol/L 27  18  18    Calcium 8.9 - 10.3 mg/dL 8.1  8.3  8.0   Total Protein 6.5 -  8.1 g/dL 5.8     Total Bilirubin 0.3 - 1.2 mg/dL 1.6     Alkaline Phos 38 - 126 U/L 130     AST 15 - 41 U/L 29     ALT 0 - 44 U/L 18       DIAGNOSTIC IMAGING:  I have independently reviewed the relevant imaging and discussed with the patient.   WRAP UP:  All questions were answered. The patient knows to call the clinic with any problems, questions or concerns.  Medical decision making: Moderate  Time spent on visit: I spent 20 minutes counseling the patient face to face. The total time spent in the appointment was 30 minutes and more than 50% was on counseling.  Carnella Guadalajara, PA-C  10/15/22 11:22 AM

## 2022-10-15 ENCOUNTER — Encounter: Payer: Self-pay | Admitting: Physician Assistant

## 2022-10-15 ENCOUNTER — Inpatient Hospital Stay: Payer: Medicare Other | Admitting: Physician Assistant

## 2022-10-15 VITALS — BP 128/58 | HR 73 | Temp 98.0°F | Resp 16 | Wt 199.3 lb

## 2022-10-15 DIAGNOSIS — N184 Chronic kidney disease, stage 4 (severe): Secondary | ICD-10-CM

## 2022-10-15 DIAGNOSIS — K7581 Nonalcoholic steatohepatitis (NASH): Secondary | ICD-10-CM | POA: Diagnosis not present

## 2022-10-15 DIAGNOSIS — N1832 Chronic kidney disease, stage 3b: Secondary | ICD-10-CM | POA: Diagnosis not present

## 2022-10-15 DIAGNOSIS — N1831 Chronic kidney disease, stage 3a: Secondary | ICD-10-CM | POA: Diagnosis not present

## 2022-10-15 DIAGNOSIS — S92901D Unspecified fracture of right foot, subsequent encounter for fracture with routine healing: Secondary | ICD-10-CM | POA: Diagnosis not present

## 2022-10-15 DIAGNOSIS — E559 Vitamin D deficiency, unspecified: Secondary | ICD-10-CM | POA: Diagnosis not present

## 2022-10-15 DIAGNOSIS — B372 Candidiasis of skin and nail: Secondary | ICD-10-CM | POA: Diagnosis not present

## 2022-10-15 DIAGNOSIS — E538 Deficiency of other specified B group vitamins: Secondary | ICD-10-CM

## 2022-10-15 DIAGNOSIS — K219 Gastro-esophageal reflux disease without esophagitis: Secondary | ICD-10-CM | POA: Diagnosis not present

## 2022-10-15 DIAGNOSIS — Z9181 History of falling: Secondary | ICD-10-CM | POA: Diagnosis not present

## 2022-10-15 DIAGNOSIS — E785 Hyperlipidemia, unspecified: Secondary | ICD-10-CM | POA: Diagnosis not present

## 2022-10-15 DIAGNOSIS — D631 Anemia in chronic kidney disease: Secondary | ICD-10-CM

## 2022-10-15 DIAGNOSIS — K649 Unspecified hemorrhoids: Secondary | ICD-10-CM | POA: Diagnosis not present

## 2022-10-15 DIAGNOSIS — I129 Hypertensive chronic kidney disease with stage 1 through stage 4 chronic kidney disease, or unspecified chronic kidney disease: Secondary | ICD-10-CM | POA: Diagnosis not present

## 2022-10-15 DIAGNOSIS — D696 Thrombocytopenia, unspecified: Secondary | ICD-10-CM

## 2022-10-15 DIAGNOSIS — J309 Allergic rhinitis, unspecified: Secondary | ICD-10-CM | POA: Diagnosis not present

## 2022-10-15 DIAGNOSIS — E039 Hypothyroidism, unspecified: Secondary | ICD-10-CM | POA: Diagnosis not present

## 2022-10-15 DIAGNOSIS — Z794 Long term (current) use of insulin: Secondary | ICD-10-CM | POA: Diagnosis not present

## 2022-10-15 DIAGNOSIS — E1122 Type 2 diabetes mellitus with diabetic chronic kidney disease: Secondary | ICD-10-CM | POA: Diagnosis not present

## 2022-10-15 DIAGNOSIS — M109 Gout, unspecified: Secondary | ICD-10-CM | POA: Diagnosis not present

## 2022-10-15 DIAGNOSIS — D500 Iron deficiency anemia secondary to blood loss (chronic): Secondary | ICD-10-CM | POA: Diagnosis not present

## 2022-10-15 DIAGNOSIS — E11319 Type 2 diabetes mellitus with unspecified diabetic retinopathy without macular edema: Secondary | ICD-10-CM | POA: Diagnosis not present

## 2022-10-15 DIAGNOSIS — K7460 Unspecified cirrhosis of liver: Secondary | ICD-10-CM | POA: Diagnosis not present

## 2022-10-15 HISTORY — DX: Anemia in chronic kidney disease: D63.1

## 2022-10-15 NOTE — Patient Instructions (Signed)
[facility] **VISIT SUMMARY & IMPORTANT INSTRUCTIONS **   You were seen today by Rojelio Brenner PA-C for your low platelets and anemia.    LOW PLATELETS Your low platelets are due to your liver cirrhosis and enlarged spleen. Although your platelets are low, they do not need any treatment at this time. We will continue monitoring of your platelets.  ANEMIA: You have anemia (low red blood cells) related to your chronic kidney disease. Your iron and B12 levels look good right now. However, your chronic kidney disease impairs your body's ability to make new red blood cells. This is treated with an injection called RETACRIT. We would like to start you on Retacrit injections once every 2 weeks to improve your blood counts. Please see the attached handout for important information regarding potential side effects of Retacrit. We will try to get in touch with your kidney doctor to discuss this as well.  FOLLOW-UP APPOINTMENT: - We will tentatively schedule you for Retacrit injections every 2 weeks, starting next week. - We will schedule you for follow-up visit in 6 weeks. - However, if you decide that you do not want Retacrit injections, please call us to cancel your injection appointments.   - - - - - - - - - - - - - - - - - -  ** Thank you for trusting me with your healthcare!  I strive to provide all of my patients with quality care at each visit.  If you receive a survey for this visit, I would be so grateful to you for taking the time to provide feedback.  Thank you in advance!  ~ Joaopedro Eschbach                   Dr. Doreatha Massed   &   Rojelio Brenner, PA-C   - - - - - - - - - - - - - - - - - -    Thank you for choosing  [facility] to provide your oncology and hematology care.  To afford each patient quality time with our provider, please arrive at least 15 minutes before your scheduled appointment time.    If you have a lab appointment with the Cancer Center please come in thru the Main Entrance and check in at the main information desk.  You need to re-schedule your appointment should you arrive 10 or more minutes late.  We strive to give you quality time with our providers, and arriving late affects you and other patients whose appointments are after yours.  Also, if you no show three or more times for appointments you may be dismissed from the clinic at the providers discretion.     Again, thank you for choosing [facility].  Our hope is that these requests will decrease the amount of time that you wait before being seen by our physicians.       _____________________________________________________________  Should you have questions after your visit to [facility], please contact our office at [phone] and follow the prompts.  Our office hours are 8:00 a.m. and 4:30 p.m. Monday - Friday.  Please note that voicemails left after 4:00 p.m. may not be returned until the following business day.  We are closed weekends and major holidays.  You do have access to a nurse 24-7, just call the main number to the clinic [phone] and do not press any options, hold  on the line and a nurse will answer the phone.    For prescription refill requests, have your pharmacy contact our office and allow 72 hours.

## 2022-10-16 DIAGNOSIS — I129 Hypertensive chronic kidney disease with stage 1 through stage 4 chronic kidney disease, or unspecified chronic kidney disease: Secondary | ICD-10-CM | POA: Diagnosis not present

## 2022-10-16 DIAGNOSIS — E559 Vitamin D deficiency, unspecified: Secondary | ICD-10-CM | POA: Diagnosis not present

## 2022-10-16 DIAGNOSIS — K7460 Unspecified cirrhosis of liver: Secondary | ICD-10-CM | POA: Diagnosis not present

## 2022-10-16 DIAGNOSIS — N1831 Chronic kidney disease, stage 3a: Secondary | ICD-10-CM | POA: Diagnosis not present

## 2022-10-16 DIAGNOSIS — M109 Gout, unspecified: Secondary | ICD-10-CM | POA: Diagnosis not present

## 2022-10-16 DIAGNOSIS — S92901D Unspecified fracture of right foot, subsequent encounter for fracture with routine healing: Secondary | ICD-10-CM | POA: Diagnosis not present

## 2022-10-16 DIAGNOSIS — J309 Allergic rhinitis, unspecified: Secondary | ICD-10-CM | POA: Diagnosis not present

## 2022-10-16 DIAGNOSIS — E11319 Type 2 diabetes mellitus with unspecified diabetic retinopathy without macular edema: Secondary | ICD-10-CM | POA: Diagnosis not present

## 2022-10-16 DIAGNOSIS — E039 Hypothyroidism, unspecified: Secondary | ICD-10-CM | POA: Diagnosis not present

## 2022-10-16 DIAGNOSIS — B372 Candidiasis of skin and nail: Secondary | ICD-10-CM | POA: Diagnosis not present

## 2022-10-16 DIAGNOSIS — K7581 Nonalcoholic steatohepatitis (NASH): Secondary | ICD-10-CM | POA: Diagnosis not present

## 2022-10-16 DIAGNOSIS — Z794 Long term (current) use of insulin: Secondary | ICD-10-CM | POA: Diagnosis not present

## 2022-10-16 DIAGNOSIS — E1122 Type 2 diabetes mellitus with diabetic chronic kidney disease: Secondary | ICD-10-CM | POA: Diagnosis not present

## 2022-10-16 DIAGNOSIS — Z9181 History of falling: Secondary | ICD-10-CM | POA: Diagnosis not present

## 2022-10-16 DIAGNOSIS — K649 Unspecified hemorrhoids: Secondary | ICD-10-CM | POA: Diagnosis not present

## 2022-10-16 DIAGNOSIS — E785 Hyperlipidemia, unspecified: Secondary | ICD-10-CM | POA: Diagnosis not present

## 2022-10-16 DIAGNOSIS — K219 Gastro-esophageal reflux disease without esophagitis: Secondary | ICD-10-CM | POA: Diagnosis not present

## 2022-10-19 DIAGNOSIS — B372 Candidiasis of skin and nail: Secondary | ICD-10-CM | POA: Diagnosis not present

## 2022-10-19 DIAGNOSIS — K649 Unspecified hemorrhoids: Secondary | ICD-10-CM | POA: Diagnosis not present

## 2022-10-19 DIAGNOSIS — M109 Gout, unspecified: Secondary | ICD-10-CM | POA: Diagnosis not present

## 2022-10-19 DIAGNOSIS — J309 Allergic rhinitis, unspecified: Secondary | ICD-10-CM | POA: Diagnosis not present

## 2022-10-19 DIAGNOSIS — E785 Hyperlipidemia, unspecified: Secondary | ICD-10-CM | POA: Diagnosis not present

## 2022-10-19 DIAGNOSIS — N1831 Chronic kidney disease, stage 3a: Secondary | ICD-10-CM | POA: Diagnosis not present

## 2022-10-19 DIAGNOSIS — I129 Hypertensive chronic kidney disease with stage 1 through stage 4 chronic kidney disease, or unspecified chronic kidney disease: Secondary | ICD-10-CM | POA: Diagnosis not present

## 2022-10-19 DIAGNOSIS — Z794 Long term (current) use of insulin: Secondary | ICD-10-CM | POA: Diagnosis not present

## 2022-10-19 DIAGNOSIS — E11319 Type 2 diabetes mellitus with unspecified diabetic retinopathy without macular edema: Secondary | ICD-10-CM | POA: Diagnosis not present

## 2022-10-19 DIAGNOSIS — K7460 Unspecified cirrhosis of liver: Secondary | ICD-10-CM | POA: Diagnosis not present

## 2022-10-19 DIAGNOSIS — Z9181 History of falling: Secondary | ICD-10-CM | POA: Diagnosis not present

## 2022-10-19 DIAGNOSIS — E559 Vitamin D deficiency, unspecified: Secondary | ICD-10-CM | POA: Diagnosis not present

## 2022-10-19 DIAGNOSIS — K7581 Nonalcoholic steatohepatitis (NASH): Secondary | ICD-10-CM | POA: Diagnosis not present

## 2022-10-19 DIAGNOSIS — K219 Gastro-esophageal reflux disease without esophagitis: Secondary | ICD-10-CM | POA: Diagnosis not present

## 2022-10-19 DIAGNOSIS — E039 Hypothyroidism, unspecified: Secondary | ICD-10-CM | POA: Diagnosis not present

## 2022-10-19 DIAGNOSIS — E1122 Type 2 diabetes mellitus with diabetic chronic kidney disease: Secondary | ICD-10-CM | POA: Diagnosis not present

## 2022-10-19 DIAGNOSIS — S92901D Unspecified fracture of right foot, subsequent encounter for fracture with routine healing: Secondary | ICD-10-CM | POA: Diagnosis not present

## 2022-10-20 ENCOUNTER — Telehealth: Payer: Self-pay | Admitting: *Deleted

## 2022-10-20 DIAGNOSIS — Z9181 History of falling: Secondary | ICD-10-CM | POA: Diagnosis not present

## 2022-10-20 DIAGNOSIS — E1122 Type 2 diabetes mellitus with diabetic chronic kidney disease: Secondary | ICD-10-CM | POA: Diagnosis not present

## 2022-10-20 DIAGNOSIS — E785 Hyperlipidemia, unspecified: Secondary | ICD-10-CM | POA: Diagnosis not present

## 2022-10-20 DIAGNOSIS — K7460 Unspecified cirrhosis of liver: Secondary | ICD-10-CM | POA: Diagnosis not present

## 2022-10-20 DIAGNOSIS — K7581 Nonalcoholic steatohepatitis (NASH): Secondary | ICD-10-CM | POA: Diagnosis not present

## 2022-10-20 DIAGNOSIS — E039 Hypothyroidism, unspecified: Secondary | ICD-10-CM | POA: Diagnosis not present

## 2022-10-20 DIAGNOSIS — K649 Unspecified hemorrhoids: Secondary | ICD-10-CM | POA: Diagnosis not present

## 2022-10-20 DIAGNOSIS — N1831 Chronic kidney disease, stage 3a: Secondary | ICD-10-CM | POA: Diagnosis not present

## 2022-10-20 DIAGNOSIS — E11319 Type 2 diabetes mellitus with unspecified diabetic retinopathy without macular edema: Secondary | ICD-10-CM | POA: Diagnosis not present

## 2022-10-20 DIAGNOSIS — M109 Gout, unspecified: Secondary | ICD-10-CM | POA: Diagnosis not present

## 2022-10-20 DIAGNOSIS — K219 Gastro-esophageal reflux disease without esophagitis: Secondary | ICD-10-CM | POA: Diagnosis not present

## 2022-10-20 DIAGNOSIS — E559 Vitamin D deficiency, unspecified: Secondary | ICD-10-CM | POA: Diagnosis not present

## 2022-10-20 DIAGNOSIS — I129 Hypertensive chronic kidney disease with stage 1 through stage 4 chronic kidney disease, or unspecified chronic kidney disease: Secondary | ICD-10-CM | POA: Diagnosis not present

## 2022-10-20 DIAGNOSIS — S92901D Unspecified fracture of right foot, subsequent encounter for fracture with routine healing: Secondary | ICD-10-CM | POA: Diagnosis not present

## 2022-10-20 DIAGNOSIS — B372 Candidiasis of skin and nail: Secondary | ICD-10-CM | POA: Diagnosis not present

## 2022-10-20 DIAGNOSIS — Z794 Long term (current) use of insulin: Secondary | ICD-10-CM | POA: Diagnosis not present

## 2022-10-20 DIAGNOSIS — J309 Allergic rhinitis, unspecified: Secondary | ICD-10-CM | POA: Diagnosis not present

## 2022-10-20 NOTE — Progress Notes (Signed)
  Care Coordination Note  10/20/2022 Name: [name] MRN: [id number] DOB: Aug 28, 1950  [name] is a 72 y.o. year old female who is a primary care patient of Margo Aye, Kathleene Hazel, MD and is actively engaged with the care management team. I reached out to Val Eagle by phone today to assist with re-scheduling an initial visit with the RN Case Manager  Follow up plan: Telephone appointment with care management team member scheduled for:10/22/22  [facility] Coordination Care Guide  Direct Dial: [phone]

## 2022-10-20 NOTE — Progress Notes (Signed)
  Care Coordination Note  10/20/2022 Name: [name] MRN: [id number] DOB: [birth year]  [name] is a 72 y.o. year old female who is a primary care patient of Margo Aye, Kathleene Hazel, MD and is actively engaged with the care management team. I reached out to Val Eagle by phone today to assist with re-scheduling an initial visit with the RN Case Manager  Follow up plan: Unsuccessful telephone outreach attempt made. A HIPAA compliant phone message was left for the patient providing contact information and requesting a return call.   [facility]  Care Coordination Care Guide  Direct Dial: [phone]

## 2022-10-21 DIAGNOSIS — E1122 Type 2 diabetes mellitus with diabetic chronic kidney disease: Secondary | ICD-10-CM | POA: Diagnosis not present

## 2022-10-21 DIAGNOSIS — K7460 Unspecified cirrhosis of liver: Secondary | ICD-10-CM | POA: Diagnosis not present

## 2022-10-21 DIAGNOSIS — E785 Hyperlipidemia, unspecified: Secondary | ICD-10-CM | POA: Diagnosis not present

## 2022-10-21 DIAGNOSIS — Z9181 History of falling: Secondary | ICD-10-CM | POA: Diagnosis not present

## 2022-10-21 DIAGNOSIS — K219 Gastro-esophageal reflux disease without esophagitis: Secondary | ICD-10-CM | POA: Diagnosis not present

## 2022-10-21 DIAGNOSIS — J309 Allergic rhinitis, unspecified: Secondary | ICD-10-CM | POA: Diagnosis not present

## 2022-10-21 DIAGNOSIS — E559 Vitamin D deficiency, unspecified: Secondary | ICD-10-CM | POA: Diagnosis not present

## 2022-10-21 DIAGNOSIS — M109 Gout, unspecified: Secondary | ICD-10-CM | POA: Diagnosis not present

## 2022-10-21 DIAGNOSIS — E11319 Type 2 diabetes mellitus with unspecified diabetic retinopathy without macular edema: Secondary | ICD-10-CM | POA: Diagnosis not present

## 2022-10-21 DIAGNOSIS — K7581 Nonalcoholic steatohepatitis (NASH): Secondary | ICD-10-CM | POA: Diagnosis not present

## 2022-10-21 DIAGNOSIS — Z794 Long term (current) use of insulin: Secondary | ICD-10-CM | POA: Diagnosis not present

## 2022-10-21 DIAGNOSIS — B372 Candidiasis of skin and nail: Secondary | ICD-10-CM | POA: Diagnosis not present

## 2022-10-21 DIAGNOSIS — S92901D Unspecified fracture of right foot, subsequent encounter for fracture with routine healing: Secondary | ICD-10-CM | POA: Diagnosis not present

## 2022-10-21 DIAGNOSIS — E039 Hypothyroidism, unspecified: Secondary | ICD-10-CM | POA: Diagnosis not present

## 2022-10-21 DIAGNOSIS — N1831 Chronic kidney disease, stage 3a: Secondary | ICD-10-CM | POA: Diagnosis not present

## 2022-10-21 DIAGNOSIS — I129 Hypertensive chronic kidney disease with stage 1 through stage 4 chronic kidney disease, or unspecified chronic kidney disease: Secondary | ICD-10-CM | POA: Diagnosis not present

## 2022-10-21 DIAGNOSIS — K649 Unspecified hemorrhoids: Secondary | ICD-10-CM | POA: Diagnosis not present

## 2022-10-22 ENCOUNTER — Ambulatory Visit: Payer: Self-pay | Admitting: *Deleted

## 2022-10-22 DIAGNOSIS — Z9181 History of falling: Secondary | ICD-10-CM | POA: Diagnosis not present

## 2022-10-22 DIAGNOSIS — K7581 Nonalcoholic steatohepatitis (NASH): Secondary | ICD-10-CM | POA: Diagnosis not present

## 2022-10-22 DIAGNOSIS — K7460 Unspecified cirrhosis of liver: Secondary | ICD-10-CM | POA: Diagnosis not present

## 2022-10-22 DIAGNOSIS — I129 Hypertensive chronic kidney disease with stage 1 through stage 4 chronic kidney disease, or unspecified chronic kidney disease: Secondary | ICD-10-CM | POA: Diagnosis not present

## 2022-10-22 DIAGNOSIS — K219 Gastro-esophageal reflux disease without esophagitis: Secondary | ICD-10-CM | POA: Diagnosis not present

## 2022-10-22 DIAGNOSIS — S92901D Unspecified fracture of right foot, subsequent encounter for fracture with routine healing: Secondary | ICD-10-CM | POA: Diagnosis not present

## 2022-10-22 DIAGNOSIS — E11319 Type 2 diabetes mellitus with unspecified diabetic retinopathy without macular edema: Secondary | ICD-10-CM | POA: Diagnosis not present

## 2022-10-22 DIAGNOSIS — K649 Unspecified hemorrhoids: Secondary | ICD-10-CM | POA: Diagnosis not present

## 2022-10-22 DIAGNOSIS — Z794 Long term (current) use of insulin: Secondary | ICD-10-CM | POA: Diagnosis not present

## 2022-10-22 DIAGNOSIS — J309 Allergic rhinitis, unspecified: Secondary | ICD-10-CM | POA: Diagnosis not present

## 2022-10-22 DIAGNOSIS — E785 Hyperlipidemia, unspecified: Secondary | ICD-10-CM | POA: Diagnosis not present

## 2022-10-22 DIAGNOSIS — E1122 Type 2 diabetes mellitus with diabetic chronic kidney disease: Secondary | ICD-10-CM | POA: Diagnosis not present

## 2022-10-22 DIAGNOSIS — M109 Gout, unspecified: Secondary | ICD-10-CM | POA: Diagnosis not present

## 2022-10-22 DIAGNOSIS — E559 Vitamin D deficiency, unspecified: Secondary | ICD-10-CM | POA: Diagnosis not present

## 2022-10-22 DIAGNOSIS — B372 Candidiasis of skin and nail: Secondary | ICD-10-CM | POA: Diagnosis not present

## 2022-10-22 DIAGNOSIS — N1831 Chronic kidney disease, stage 3a: Secondary | ICD-10-CM | POA: Diagnosis not present

## 2022-10-22 DIAGNOSIS — E039 Hypothyroidism, unspecified: Secondary | ICD-10-CM | POA: Diagnosis not present

## 2022-10-23 ENCOUNTER — Ambulatory Visit: Payer: Self-pay | Admitting: *Deleted

## 2022-10-23 NOTE — Patient Outreach (Signed)
  Care Coordination   10/23/2022 late entry for 10/22/22 Name: [name] MRN: [id number] DOB: 1950/09/06   Care Coordination Outreach Attempts:  An unsuccessful telephone outreach was attempted for a scheduled appointment today.  Follow Up Plan:  Additional outreach attempts will be made to offer the patient care coordination information and services.   Encounter Outcome:  No Answer   Care Coordination Interventions:  No, not indicated    Saverio Kader L. Noelle Penner, RN, BSN, CCM [facility] Care Management Community Coordinator Office number [phone]

## 2022-10-23 NOTE — Patient Outreach (Signed)
  Care Coordination   Initial Visit Note   06/28/2023 updated 10/23/22 note Name: [name] MRN: [id number] DOB: Jul 12, 1950  [name] is a 72 y.o. year old female who sees Margo Aye, Kathleene Hazel, MD for primary care. I spoke with  Val Eagle by phone today.  What matters to the patients health and wellness today?  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 liver paracentesis every 2-3 week palliative come every 6 weeks -hospice Has not seen pcp in a while [facility]ologist AP rebecca referred by hall Chronic Kidney disease (CKD) Goes Tuesday to take first injection  Daughter very knowledgeable of patient's care  Has hospital bed, wheelchair, bedside commode  Aware that increase sodium causes increase fluids Weaned patient off of midodrine only if BP below 100/50  BP128/68 using propanolol  Fall risk fell 3 times Lives with grand daughter, Marchelle Folks in [place]  was living at her son home prior to this  Dr Despina Hidden put in her passer 3 16 oz bottles of fluids Need set up to minimal assist with  Activities of daily living (ADLs) Has OT & PT twice a week    Goals Addressed             This Visit's Progress    Palliative care services -[facility] care coordination services   On track    Interventions Today    Flowsheet Row Most Recent Value  Chronic Disease   Chronic disease during today's visit Diabetes, Congestive Heart Failure (CHF), Chronic Kidney Disease/End Stage Renal Disease (ESRD), Hypertension (HTN)  General Interventions   General Interventions Discussed/Reviewed General Interventions Discussed, Labs, Durable Medical Equipment (DME), Community Resources, Doctor Visits  Doctor Visits Discussed/Reviewed Doctor Visits Discussed, PCP, Specialist  Durable Medical Equipment (DME) Bed side commode, BP Cuff,  Wheelchair  Wheelchair Standard  PCP/Specialist Visits Compliance with follow-up visit  Exercise Interventions   Exercise Discussed/Reviewed Exercise Discussed, Physical Activity  Physical Activity Discussed/Reviewed Physical Activity Discussed  Education Interventions   Education Provided --  [hibiclens suggested for antibacterial/antimicrobial cleansing of skin folds -dancing goat recomended for lower cost tub slidign transfer bench]  Provided Verbal Education On Nutrition, Labs, [employer], Medication, Blood Sugar Monitoring, Mental Health/Coping with Illness  Labs Reviewed Kidney Function, Hgb A1c  Mental Health Interventions   Mental Health Discussed/Reviewed Mental Health Discussed, Coping Strategies  Nutrition Interventions   Nutrition Discussed/Reviewed Nutrition Discussed, Fluid intake, Decreasing salt  Pharmacy Interventions   Pharmacy Dicussed/Reviewed Pharmacy Topics Discussed, Medications and their functions, Affording Medications  Safety Interventions   Safety Discussed/Reviewed Safety Discussed, Fall Risk  Advanced Directive Interventions   Advanced Directives Discussed/Reviewed Advanced Directives Discussed, Advanced Care Planning              SDOH assessments and interventions completed:  Yes  SDOH Interventions Today    Flowsheet Row Most Recent Value  SDOH Interventions   Food Insecurity Interventions Intervention Not Indicated  Transportation Interventions Intervention Not Indicated        Care Coordination Interventions:  Yes, provided   Follow up plan: Follow up call scheduled for 02/22/23    Encounter Outcome:  Patient Visit Completed   Cala Bradford L. Noelle Penner, RN, BSN, CCM [place]  [facility] RN Care Manager Direct Dial: [phone]  Fax: [phone]

## 2022-10-23 NOTE — Patient Instructions (Addendum)
 Visit Information  Thank you for taking time to visit with me today. Please don't hesitate to contact me if I can be of assistance to you.   Following are the goals we discussed today:   Goals Addressed             This Visit's Progress    Palliative care services -[facility] care coordination services   On track    Interventions Today    Flowsheet Row Most Recent Value  Chronic Disease   Chronic disease during today's visit Diabetes, Congestive Heart Failure (CHF), Chronic Kidney Disease/End Stage Renal Disease (ESRD), Hypertension (HTN)  General Interventions   General Interventions Discussed/Reviewed General Interventions Discussed, Labs, Durable Medical Equipment (DME), Community Resources, Doctor Visits  Doctor Visits Discussed/Reviewed Doctor Visits Discussed, PCP, Specialist  Durable Medical Equipment (DME) Bed side commode, BP Cuff, Wheelchair  Wheelchair Standard  PCP/Specialist Visits Compliance with follow-up visit  Exercise Interventions   Exercise Discussed/Reviewed Exercise Discussed, Physical Activity  Physical Activity Discussed/Reviewed Physical Activity Discussed  Education Interventions   Education Provided --  [hibiclens suggested for antibacterial/antimicrobial cleansing of skin folds -dancing goat recomended for lower cost tub slidign transfer bench]  Provided Verbal Education On Nutrition, Labs, [employer], Medication, Blood Sugar Monitoring, Mental Health/Coping with Illness  Labs Reviewed Kidney Function, Hgb A1c  Mental Health Interventions   Mental Health Discussed/Reviewed Mental Health Discussed, Coping Strategies  Nutrition Interventions   Nutrition Discussed/Reviewed Nutrition Discussed, Fluid intake, Decreasing salt  Pharmacy Interventions   Pharmacy Dicussed/Reviewed Pharmacy Topics Discussed, Medications and their functions, Affording Medications  Safety Interventions   Safety Discussed/Reviewed Safety Discussed, Fall Risk  Advanced Directive  Interventions   Advanced Directives Discussed/Reviewed Advanced Directives Discussed, Advanced Care Planning              Our next appointment is by telephone on 02/22/23 at 2:30 pm  Please call the care guide team at [phone] if you need to cancel or reschedule your appointment.   If you are experiencing a Mental Health or Behavioral Health Crisis or need someone to talk to, please call the Suicide and Crisis Lifeline: 988 call the [place] National Suicide Prevention Lifeline: 409-834-2038 or TTY: 401-870-6404 TTY (985) 522-6083) to talk to a trained counselor call 1-800-273-TALK (toll free, 24 hour hotline) call the [facility]: 615-419-5461 call 911   Patient verbalizes understanding of instructions and care plan provided today and agrees to view in MyChart. Active MyChart status and patient understanding of how to access instructions and care plan via MyChart confirmed with patient.     The patient has been provided with contact information for the care management team and has been advised to call with any health related questions or concerns.   Clayton Jarmon L. Noelle Penner, RN, BSN, CCM [facility] Care Management Community Coordinator Office number [phone]

## 2022-10-26 ENCOUNTER — Telehealth: Payer: Self-pay | Admitting: Physician Assistant

## 2022-10-26 DIAGNOSIS — K219 Gastro-esophageal reflux disease without esophagitis: Secondary | ICD-10-CM | POA: Diagnosis not present

## 2022-10-26 DIAGNOSIS — K7460 Unspecified cirrhosis of liver: Secondary | ICD-10-CM | POA: Diagnosis not present

## 2022-10-26 DIAGNOSIS — E559 Vitamin D deficiency, unspecified: Secondary | ICD-10-CM | POA: Diagnosis not present

## 2022-10-26 DIAGNOSIS — K649 Unspecified hemorrhoids: Secondary | ICD-10-CM | POA: Diagnosis not present

## 2022-10-26 DIAGNOSIS — I129 Hypertensive chronic kidney disease with stage 1 through stage 4 chronic kidney disease, or unspecified chronic kidney disease: Secondary | ICD-10-CM | POA: Diagnosis not present

## 2022-10-26 DIAGNOSIS — N1831 Chronic kidney disease, stage 3a: Secondary | ICD-10-CM | POA: Diagnosis not present

## 2022-10-26 DIAGNOSIS — E039 Hypothyroidism, unspecified: Secondary | ICD-10-CM | POA: Diagnosis not present

## 2022-10-26 DIAGNOSIS — B372 Candidiasis of skin and nail: Secondary | ICD-10-CM | POA: Diagnosis not present

## 2022-10-26 DIAGNOSIS — Z794 Long term (current) use of insulin: Secondary | ICD-10-CM | POA: Diagnosis not present

## 2022-10-26 DIAGNOSIS — K7581 Nonalcoholic steatohepatitis (NASH): Secondary | ICD-10-CM | POA: Diagnosis not present

## 2022-10-26 DIAGNOSIS — Z9181 History of falling: Secondary | ICD-10-CM | POA: Diagnosis not present

## 2022-10-26 DIAGNOSIS — E11319 Type 2 diabetes mellitus with unspecified diabetic retinopathy without macular edema: Secondary | ICD-10-CM | POA: Diagnosis not present

## 2022-10-26 DIAGNOSIS — E785 Hyperlipidemia, unspecified: Secondary | ICD-10-CM | POA: Diagnosis not present

## 2022-10-26 DIAGNOSIS — E1122 Type 2 diabetes mellitus with diabetic chronic kidney disease: Secondary | ICD-10-CM | POA: Diagnosis not present

## 2022-10-26 DIAGNOSIS — M109 Gout, unspecified: Secondary | ICD-10-CM | POA: Diagnosis not present

## 2022-10-26 DIAGNOSIS — J309 Allergic rhinitis, unspecified: Secondary | ICD-10-CM | POA: Diagnosis not present

## 2022-10-26 DIAGNOSIS — S92901D Unspecified fracture of right foot, subsequent encounter for fracture with routine healing: Secondary | ICD-10-CM | POA: Diagnosis not present

## 2022-10-27 ENCOUNTER — Inpatient Hospital Stay: Payer: Medicare Other

## 2022-10-27 VITALS — BP 120/56 | HR 71 | Temp 98.0°F | Resp 18

## 2022-10-27 DIAGNOSIS — D631 Anemia in chronic kidney disease: Secondary | ICD-10-CM | POA: Diagnosis not present

## 2022-10-27 DIAGNOSIS — E538 Deficiency of other specified B group vitamins: Secondary | ICD-10-CM | POA: Diagnosis not present

## 2022-10-27 DIAGNOSIS — D500 Iron deficiency anemia secondary to blood loss (chronic): Secondary | ICD-10-CM

## 2022-10-27 DIAGNOSIS — N1832 Chronic kidney disease, stage 3b: Secondary | ICD-10-CM | POA: Diagnosis not present

## 2022-10-27 LAB — CBC
HCT: 29.8 % — ABNORMAL LOW (ref 36.0–46.0)
Hemoglobin: 9.7 g/dL — ABNORMAL LOW (ref 12.0–15.0)
MCH: 33.3 pg (ref 26.0–34.0)
MCHC: 32.6 g/dL (ref 30.0–36.0)
MCV: 102.4 fL — ABNORMAL HIGH (ref 80.0–100.0)
Platelets: 64 10*3/uL — ABNORMAL LOW (ref 150–400)
RBC: 2.91 MIL/uL — ABNORMAL LOW (ref 3.87–5.11)
RDW: 15.2 % (ref 11.5–15.5)
WBC: 7.5 10*3/uL (ref 4.0–10.5)
nRBC: 0 % (ref 0.0–0.2)

## 2022-10-27 MED ORDER — EPOETIN ALFA-EPBX 10000 UNIT/ML IJ SOLN
10000.0000 [IU] | Freq: Once | INTRAMUSCULAR | Status: AC
  Administered 2022-10-27: 10000 [IU] via SUBCUTANEOUS
  Filled 2022-10-27: qty 1

## 2022-10-27 NOTE — Patient Instructions (Signed)
[facility]  Discharge Instructions: Thank you for choosing [facility] to provide your oncology and hematology care.  If you have a lab appointment with the Cancer Center - please note that after April 8th, 2024, all labs will be drawn in the cancer center.  You do not have to check in or register with the main entrance as you have in the past but will complete your check-in in the cancer center.  Wear comfortable clothing and clothing appropriate for easy access to any Portacath or PICC line.   We strive to give you quality time with your provider. You may need to reschedule your appointment if you arrive late (15 or more minutes).  Arriving late affects you and other patients whose appointments are after yours.  Also, if you miss three or more appointments without notifying the office, you may be dismissed from the clinic at the provider's discretion.      For prescription refill requests, have your pharmacy contact our office and allow 72 hours for refills to be completed.    Today you received the following, your retacrit injection   To help prevent nausea and vomiting after your treatment, we encourage you to take your nausea medication as directed.  BELOW ARE SYMPTOMS THAT SHOULD BE REPORTED IMMEDIATELY: *FEVER GREATER THAN 100.4 F (38 C) OR HIGHER *CHILLS OR SWEATING *NAUSEA AND VOMITING THAT IS NOT CONTROLLED WITH YOUR NAUSEA MEDICATION *UNUSUAL SHORTNESS OF BREATH *UNUSUAL BRUISING OR BLEEDING *URINARY PROBLEMS (pain or burning when urinating, or frequent urination) *BOWEL PROBLEMS (unusual diarrhea, constipation, pain near the anus) TENDERNESS IN MOUTH AND THROAT WITH OR WITHOUT PRESENCE OF ULCERS (sore throat, sores in mouth, or a toothache) UNUSUAL RASH, SWELLING OR PAIN  UNUSUAL VAGINAL DISCHARGE OR ITCHING   Items with * indicate a potential emergency and should be followed up as soon as possible or go to the Emergency Department if any  problems should occur.  Please show the CHEMOTHERAPY ALERT CARD or IMMUNOTHERAPY ALERT CARD at check-in to the Emergency Department and triage nurse.  Should you have questions after your visit or need to cancel or reschedule your appointment, please contact [facility] [phone]  and follow the prompts.  Office hours are 8:00 a.m. to 4:30 p.m. Monday - Friday. Please note that voicemails left after 4:00 p.m. may not be returned until the following business day.  We are closed weekends and major holidays. You have access to a nurse at all times for urgent questions. Please call the main number to the clinic [phone] and follow the prompts.  For any non-urgent questions, you may also contact your provider using MyChart. We now offer e-Visits for anyone 12 and older to request care online for non-urgent symptoms. For details visit [email or web address].   Also download the MyChart app! Go to the app store, search "MyChart", open the app, select [place], and log in with your MyChart username and password.

## 2022-10-27 NOTE — Progress Notes (Signed)
Retacrit injection given per orders. Patient tolerated it well without problems. Vitals stable and discharged home from clinic ambulatory. Follow up as scheduled.  

## 2022-10-28 DIAGNOSIS — E559 Vitamin D deficiency, unspecified: Secondary | ICD-10-CM | POA: Diagnosis not present

## 2022-10-28 DIAGNOSIS — S92901D Unspecified fracture of right foot, subsequent encounter for fracture with routine healing: Secondary | ICD-10-CM | POA: Diagnosis not present

## 2022-10-28 DIAGNOSIS — N1831 Chronic kidney disease, stage 3a: Secondary | ICD-10-CM | POA: Diagnosis not present

## 2022-10-28 DIAGNOSIS — E11319 Type 2 diabetes mellitus with unspecified diabetic retinopathy without macular edema: Secondary | ICD-10-CM | POA: Diagnosis not present

## 2022-10-28 DIAGNOSIS — B372 Candidiasis of skin and nail: Secondary | ICD-10-CM | POA: Diagnosis not present

## 2022-10-28 DIAGNOSIS — Z794 Long term (current) use of insulin: Secondary | ICD-10-CM | POA: Diagnosis not present

## 2022-10-28 DIAGNOSIS — K7581 Nonalcoholic steatohepatitis (NASH): Secondary | ICD-10-CM | POA: Diagnosis not present

## 2022-10-28 DIAGNOSIS — J309 Allergic rhinitis, unspecified: Secondary | ICD-10-CM | POA: Diagnosis not present

## 2022-10-28 DIAGNOSIS — K219 Gastro-esophageal reflux disease without esophagitis: Secondary | ICD-10-CM | POA: Diagnosis not present

## 2022-10-28 DIAGNOSIS — Z9181 History of falling: Secondary | ICD-10-CM | POA: Diagnosis not present

## 2022-10-28 DIAGNOSIS — K649 Unspecified hemorrhoids: Secondary | ICD-10-CM | POA: Diagnosis not present

## 2022-10-28 DIAGNOSIS — E1122 Type 2 diabetes mellitus with diabetic chronic kidney disease: Secondary | ICD-10-CM | POA: Diagnosis not present

## 2022-10-28 DIAGNOSIS — K7460 Unspecified cirrhosis of liver: Secondary | ICD-10-CM | POA: Diagnosis not present

## 2022-10-28 DIAGNOSIS — M109 Gout, unspecified: Secondary | ICD-10-CM | POA: Diagnosis not present

## 2022-10-28 DIAGNOSIS — I129 Hypertensive chronic kidney disease with stage 1 through stage 4 chronic kidney disease, or unspecified chronic kidney disease: Secondary | ICD-10-CM | POA: Diagnosis not present

## 2022-10-28 DIAGNOSIS — E039 Hypothyroidism, unspecified: Secondary | ICD-10-CM | POA: Diagnosis not present

## 2022-10-28 DIAGNOSIS — E785 Hyperlipidemia, unspecified: Secondary | ICD-10-CM | POA: Diagnosis not present

## 2022-10-30 ENCOUNTER — Ambulatory Visit (HOSPITAL_COMMUNITY)
Admission: RE | Admit: 2022-10-30 | Discharge: 2022-10-30 | Disposition: A | Discharge status: Home / Self Care | Payer: Medicare Other | Source: Ambulatory Visit | Attending: Gastroenterology | Admitting: Gastroenterology

## 2022-10-30 ENCOUNTER — Encounter (HOSPITAL_COMMUNITY): Payer: Self-pay

## 2022-10-30 DIAGNOSIS — R188 Other ascites: Secondary | ICD-10-CM | POA: Diagnosis not present

## 2022-10-30 DIAGNOSIS — K7460 Unspecified cirrhosis of liver: Secondary | ICD-10-CM | POA: Diagnosis not present

## 2022-10-30 NOTE — Progress Notes (Signed)
Patient tolerated right sided paracentesis procedure well today and 5 Liters of clear yellow ascites removed. Patient verbalized understanding of discharge instructions and left via wheelchair with daughter with no acute distress noted.

## 2022-11-02 ENCOUNTER — Other Ambulatory Visit (HOSPITAL_COMMUNITY): Payer: Self-pay | Admitting: Gastroenterology

## 2022-11-02 DIAGNOSIS — R188 Other ascites: Secondary | ICD-10-CM

## 2022-11-03 DIAGNOSIS — M109 Gout, unspecified: Secondary | ICD-10-CM | POA: Diagnosis not present

## 2022-11-03 DIAGNOSIS — E039 Hypothyroidism, unspecified: Secondary | ICD-10-CM | POA: Diagnosis not present

## 2022-11-03 DIAGNOSIS — E1122 Type 2 diabetes mellitus with diabetic chronic kidney disease: Secondary | ICD-10-CM | POA: Diagnosis not present

## 2022-11-03 DIAGNOSIS — Z794 Long term (current) use of insulin: Secondary | ICD-10-CM | POA: Diagnosis not present

## 2022-11-03 DIAGNOSIS — N1831 Chronic kidney disease, stage 3a: Secondary | ICD-10-CM | POA: Diagnosis not present

## 2022-11-03 DIAGNOSIS — J309 Allergic rhinitis, unspecified: Secondary | ICD-10-CM | POA: Diagnosis not present

## 2022-11-03 DIAGNOSIS — E559 Vitamin D deficiency, unspecified: Secondary | ICD-10-CM | POA: Diagnosis not present

## 2022-11-03 DIAGNOSIS — I129 Hypertensive chronic kidney disease with stage 1 through stage 4 chronic kidney disease, or unspecified chronic kidney disease: Secondary | ICD-10-CM | POA: Diagnosis not present

## 2022-11-03 DIAGNOSIS — E11319 Type 2 diabetes mellitus with unspecified diabetic retinopathy without macular edema: Secondary | ICD-10-CM | POA: Diagnosis not present

## 2022-11-03 DIAGNOSIS — S92901D Unspecified fracture of right foot, subsequent encounter for fracture with routine healing: Secondary | ICD-10-CM | POA: Diagnosis not present

## 2022-11-03 DIAGNOSIS — B372 Candidiasis of skin and nail: Secondary | ICD-10-CM | POA: Diagnosis not present

## 2022-11-03 DIAGNOSIS — K7460 Unspecified cirrhosis of liver: Secondary | ICD-10-CM | POA: Diagnosis not present

## 2022-11-03 DIAGNOSIS — E785 Hyperlipidemia, unspecified: Secondary | ICD-10-CM | POA: Diagnosis not present

## 2022-11-03 DIAGNOSIS — Z9181 History of falling: Secondary | ICD-10-CM | POA: Diagnosis not present

## 2022-11-03 DIAGNOSIS — K649 Unspecified hemorrhoids: Secondary | ICD-10-CM | POA: Diagnosis not present

## 2022-11-03 DIAGNOSIS — K7581 Nonalcoholic steatohepatitis (NASH): Secondary | ICD-10-CM | POA: Diagnosis not present

## 2022-11-03 DIAGNOSIS — K219 Gastro-esophageal reflux disease without esophagitis: Secondary | ICD-10-CM | POA: Diagnosis not present

## 2022-11-04 DIAGNOSIS — E559 Vitamin D deficiency, unspecified: Secondary | ICD-10-CM | POA: Diagnosis not present

## 2022-11-04 DIAGNOSIS — I129 Hypertensive chronic kidney disease with stage 1 through stage 4 chronic kidney disease, or unspecified chronic kidney disease: Secondary | ICD-10-CM | POA: Diagnosis not present

## 2022-11-04 DIAGNOSIS — Z794 Long term (current) use of insulin: Secondary | ICD-10-CM | POA: Diagnosis not present

## 2022-11-04 DIAGNOSIS — K219 Gastro-esophageal reflux disease without esophagitis: Secondary | ICD-10-CM | POA: Diagnosis not present

## 2022-11-04 DIAGNOSIS — J309 Allergic rhinitis, unspecified: Secondary | ICD-10-CM | POA: Diagnosis not present

## 2022-11-04 DIAGNOSIS — N1831 Chronic kidney disease, stage 3a: Secondary | ICD-10-CM | POA: Diagnosis not present

## 2022-11-04 DIAGNOSIS — K649 Unspecified hemorrhoids: Secondary | ICD-10-CM | POA: Diagnosis not present

## 2022-11-04 DIAGNOSIS — K7460 Unspecified cirrhosis of liver: Secondary | ICD-10-CM | POA: Diagnosis not present

## 2022-11-04 DIAGNOSIS — Z9181 History of falling: Secondary | ICD-10-CM | POA: Diagnosis not present

## 2022-11-04 DIAGNOSIS — K7581 Nonalcoholic steatohepatitis (NASH): Secondary | ICD-10-CM | POA: Diagnosis not present

## 2022-11-04 DIAGNOSIS — B372 Candidiasis of skin and nail: Secondary | ICD-10-CM | POA: Diagnosis not present

## 2022-11-04 DIAGNOSIS — S92901D Unspecified fracture of right foot, subsequent encounter for fracture with routine healing: Secondary | ICD-10-CM | POA: Diagnosis not present

## 2022-11-04 DIAGNOSIS — E785 Hyperlipidemia, unspecified: Secondary | ICD-10-CM | POA: Diagnosis not present

## 2022-11-04 DIAGNOSIS — E039 Hypothyroidism, unspecified: Secondary | ICD-10-CM | POA: Diagnosis not present

## 2022-11-04 DIAGNOSIS — E1122 Type 2 diabetes mellitus with diabetic chronic kidney disease: Secondary | ICD-10-CM | POA: Diagnosis not present

## 2022-11-04 DIAGNOSIS — E11319 Type 2 diabetes mellitus with unspecified diabetic retinopathy without macular edema: Secondary | ICD-10-CM | POA: Diagnosis not present

## 2022-11-04 DIAGNOSIS — M109 Gout, unspecified: Secondary | ICD-10-CM | POA: Diagnosis not present

## 2022-11-06 ENCOUNTER — Encounter: Payer: Self-pay | Admitting: Hematology

## 2022-11-09 DIAGNOSIS — K219 Gastro-esophageal reflux disease without esophagitis: Secondary | ICD-10-CM | POA: Diagnosis not present

## 2022-11-09 DIAGNOSIS — M109 Gout, unspecified: Secondary | ICD-10-CM | POA: Diagnosis not present

## 2022-11-09 DIAGNOSIS — Z794 Long term (current) use of insulin: Secondary | ICD-10-CM | POA: Diagnosis not present

## 2022-11-09 DIAGNOSIS — E1122 Type 2 diabetes mellitus with diabetic chronic kidney disease: Secondary | ICD-10-CM | POA: Diagnosis not present

## 2022-11-09 DIAGNOSIS — E11319 Type 2 diabetes mellitus with unspecified diabetic retinopathy without macular edema: Secondary | ICD-10-CM | POA: Diagnosis not present

## 2022-11-09 DIAGNOSIS — E559 Vitamin D deficiency, unspecified: Secondary | ICD-10-CM | POA: Diagnosis not present

## 2022-11-09 DIAGNOSIS — K649 Unspecified hemorrhoids: Secondary | ICD-10-CM | POA: Diagnosis not present

## 2022-11-09 DIAGNOSIS — N1831 Chronic kidney disease, stage 3a: Secondary | ICD-10-CM | POA: Diagnosis not present

## 2022-11-09 DIAGNOSIS — Z9181 History of falling: Secondary | ICD-10-CM | POA: Diagnosis not present

## 2022-11-09 DIAGNOSIS — B372 Candidiasis of skin and nail: Secondary | ICD-10-CM | POA: Diagnosis not present

## 2022-11-09 DIAGNOSIS — J309 Allergic rhinitis, unspecified: Secondary | ICD-10-CM | POA: Diagnosis not present

## 2022-11-09 DIAGNOSIS — I129 Hypertensive chronic kidney disease with stage 1 through stage 4 chronic kidney disease, or unspecified chronic kidney disease: Secondary | ICD-10-CM | POA: Diagnosis not present

## 2022-11-09 DIAGNOSIS — S92901D Unspecified fracture of right foot, subsequent encounter for fracture with routine healing: Secondary | ICD-10-CM | POA: Diagnosis not present

## 2022-11-09 DIAGNOSIS — E785 Hyperlipidemia, unspecified: Secondary | ICD-10-CM | POA: Diagnosis not present

## 2022-11-09 DIAGNOSIS — K7460 Unspecified cirrhosis of liver: Secondary | ICD-10-CM | POA: Diagnosis not present

## 2022-11-09 DIAGNOSIS — E039 Hypothyroidism, unspecified: Secondary | ICD-10-CM | POA: Diagnosis not present

## 2022-11-09 DIAGNOSIS — K7581 Nonalcoholic steatohepatitis (NASH): Secondary | ICD-10-CM | POA: Diagnosis not present

## 2022-11-11 ENCOUNTER — Inpatient Hospital Stay: Payer: Medicare Other

## 2022-11-11 ENCOUNTER — Inpatient Hospital Stay: Payer: Medicare Other | Attending: Physician Assistant

## 2022-11-11 VITALS — BP 123/58 | HR 66 | Temp 96.3°F | Resp 18

## 2022-11-11 DIAGNOSIS — N1832 Chronic kidney disease, stage 3b: Secondary | ICD-10-CM | POA: Diagnosis not present

## 2022-11-11 DIAGNOSIS — I129 Hypertensive chronic kidney disease with stage 1 through stage 4 chronic kidney disease, or unspecified chronic kidney disease: Secondary | ICD-10-CM | POA: Diagnosis not present

## 2022-11-11 DIAGNOSIS — N1831 Chronic kidney disease, stage 3a: Secondary | ICD-10-CM | POA: Diagnosis not present

## 2022-11-11 DIAGNOSIS — D631 Anemia in chronic kidney disease: Secondary | ICD-10-CM | POA: Diagnosis not present

## 2022-11-11 DIAGNOSIS — N189 Chronic kidney disease, unspecified: Secondary | ICD-10-CM | POA: Diagnosis not present

## 2022-11-11 DIAGNOSIS — M898X9 Other specified disorders of bone, unspecified site: Secondary | ICD-10-CM | POA: Diagnosis not present

## 2022-11-11 DIAGNOSIS — D500 Iron deficiency anemia secondary to blood loss (chronic): Secondary | ICD-10-CM

## 2022-11-11 LAB — CBC
HCT: 31.6 % — ABNORMAL LOW (ref 36.0–46.0)
Hemoglobin: 10.1 g/dL — ABNORMAL LOW (ref 12.0–15.0)
MCH: 32.9 pg (ref 26.0–34.0)
MCHC: 32 g/dL (ref 30.0–36.0)
MCV: 102.9 fL — ABNORMAL HIGH (ref 80.0–100.0)
Platelets: 80 10*3/uL — ABNORMAL LOW (ref 150–400)
RBC: 3.07 MIL/uL — ABNORMAL LOW (ref 3.87–5.11)
RDW: 14.5 % (ref 11.5–15.5)
WBC: 7.5 10*3/uL (ref 4.0–10.5)
nRBC: 0 % (ref 0.0–0.2)

## 2022-11-11 MED ORDER — EPOETIN ALFA-EPBX 10000 UNIT/ML IJ SOLN
10000.0000 [IU] | Freq: Once | INTRAMUSCULAR | Status: AC
  Administered 2022-11-11: 10000 [IU] via SUBCUTANEOUS
  Filled 2022-11-11: qty 1

## 2022-11-11 NOTE — Patient Instructions (Signed)

## 2022-11-11 NOTE — Progress Notes (Signed)
Patient stated she is experiencing nausea after starting Retacrit.  She will try her Zofran from home and call to let us know if it helps.   Patient tolerated injection with no complaints voiced.  Site clean and dry with no bruising or swelling noted at site.  See MAR for details.  Band aid applied.  Patient stable during and after injection.  Vss with discharge and left in satisfactory condition with no s/s of distress noted.

## 2022-11-13 DIAGNOSIS — J309 Allergic rhinitis, unspecified: Secondary | ICD-10-CM | POA: Diagnosis not present

## 2022-11-13 DIAGNOSIS — M109 Gout, unspecified: Secondary | ICD-10-CM | POA: Diagnosis not present

## 2022-11-13 DIAGNOSIS — E559 Vitamin D deficiency, unspecified: Secondary | ICD-10-CM | POA: Diagnosis not present

## 2022-11-13 DIAGNOSIS — E039 Hypothyroidism, unspecified: Secondary | ICD-10-CM | POA: Diagnosis not present

## 2022-11-13 DIAGNOSIS — Z794 Long term (current) use of insulin: Secondary | ICD-10-CM | POA: Diagnosis not present

## 2022-11-13 DIAGNOSIS — K7460 Unspecified cirrhosis of liver: Secondary | ICD-10-CM | POA: Diagnosis not present

## 2022-11-13 DIAGNOSIS — E1122 Type 2 diabetes mellitus with diabetic chronic kidney disease: Secondary | ICD-10-CM | POA: Diagnosis not present

## 2022-11-13 DIAGNOSIS — E785 Hyperlipidemia, unspecified: Secondary | ICD-10-CM | POA: Diagnosis not present

## 2022-11-13 DIAGNOSIS — S92901D Unspecified fracture of right foot, subsequent encounter for fracture with routine healing: Secondary | ICD-10-CM | POA: Diagnosis not present

## 2022-11-13 DIAGNOSIS — K219 Gastro-esophageal reflux disease without esophagitis: Secondary | ICD-10-CM | POA: Diagnosis not present

## 2022-11-13 DIAGNOSIS — Z9181 History of falling: Secondary | ICD-10-CM | POA: Diagnosis not present

## 2022-11-13 DIAGNOSIS — E11319 Type 2 diabetes mellitus with unspecified diabetic retinopathy without macular edema: Secondary | ICD-10-CM | POA: Diagnosis not present

## 2022-11-13 DIAGNOSIS — K7581 Nonalcoholic steatohepatitis (NASH): Secondary | ICD-10-CM | POA: Diagnosis not present

## 2022-11-13 DIAGNOSIS — K649 Unspecified hemorrhoids: Secondary | ICD-10-CM | POA: Diagnosis not present

## 2022-11-13 DIAGNOSIS — B372 Candidiasis of skin and nail: Secondary | ICD-10-CM | POA: Diagnosis not present

## 2022-11-13 DIAGNOSIS — I129 Hypertensive chronic kidney disease with stage 1 through stage 4 chronic kidney disease, or unspecified chronic kidney disease: Secondary | ICD-10-CM | POA: Diagnosis not present

## 2022-11-13 DIAGNOSIS — N1831 Chronic kidney disease, stage 3a: Secondary | ICD-10-CM | POA: Diagnosis not present

## 2022-11-16 ENCOUNTER — Encounter (HOSPITAL_COMMUNITY): Payer: Self-pay

## 2022-11-16 ENCOUNTER — Ambulatory Visit (HOSPITAL_COMMUNITY)
Admission: RE | Admit: 2022-11-16 | Discharge: 2022-11-16 | Disposition: A | Discharge status: Home / Self Care | Payer: Medicare Other | Source: Ambulatory Visit | Attending: Gastroenterology | Admitting: Gastroenterology

## 2022-11-16 DIAGNOSIS — R188 Other ascites: Secondary | ICD-10-CM

## 2022-11-16 DIAGNOSIS — K7460 Unspecified cirrhosis of liver: Secondary | ICD-10-CM | POA: Diagnosis not present

## 2022-11-16 MED ORDER — ALBUMIN HUMAN 25 % IV SOLN
25.0000 g | Freq: Once | INTRAVENOUS | Status: AC
  Administered 2022-11-16: 25 g via INTRAVENOUS

## 2022-11-16 MED ORDER — ALBUMIN HUMAN 25 % IV SOLN
INTRAVENOUS | Status: AC
  Filled 2022-11-16: qty 100

## 2022-11-16 NOTE — Progress Notes (Signed)
Patient tolerated right sided paracentesis and 25G of IV albumin well today and 4 Liters of clear yellow ascites removed. Patient verbalized understanding of discharge instructions and left via wheelchair with son with no acute distress noted.

## 2022-11-18 ENCOUNTER — Ambulatory Visit: Payer: Medicare Other | Admitting: Physician Assistant

## 2022-11-18 ENCOUNTER — Other Ambulatory Visit: Payer: Medicare Other

## 2022-11-18 DIAGNOSIS — E559 Vitamin D deficiency, unspecified: Secondary | ICD-10-CM | POA: Diagnosis not present

## 2022-11-18 DIAGNOSIS — B372 Candidiasis of skin and nail: Secondary | ICD-10-CM | POA: Diagnosis not present

## 2022-11-18 DIAGNOSIS — K649 Unspecified hemorrhoids: Secondary | ICD-10-CM | POA: Diagnosis not present

## 2022-11-18 DIAGNOSIS — E039 Hypothyroidism, unspecified: Secondary | ICD-10-CM | POA: Diagnosis not present

## 2022-11-18 DIAGNOSIS — E1122 Type 2 diabetes mellitus with diabetic chronic kidney disease: Secondary | ICD-10-CM | POA: Diagnosis not present

## 2022-11-18 DIAGNOSIS — E785 Hyperlipidemia, unspecified: Secondary | ICD-10-CM | POA: Diagnosis not present

## 2022-11-18 DIAGNOSIS — E11319 Type 2 diabetes mellitus with unspecified diabetic retinopathy without macular edema: Secondary | ICD-10-CM | POA: Diagnosis not present

## 2022-11-18 DIAGNOSIS — K219 Gastro-esophageal reflux disease without esophagitis: Secondary | ICD-10-CM | POA: Diagnosis not present

## 2022-11-18 DIAGNOSIS — M109 Gout, unspecified: Secondary | ICD-10-CM | POA: Diagnosis not present

## 2022-11-18 DIAGNOSIS — K7460 Unspecified cirrhosis of liver: Secondary | ICD-10-CM | POA: Diagnosis not present

## 2022-11-18 DIAGNOSIS — I129 Hypertensive chronic kidney disease with stage 1 through stage 4 chronic kidney disease, or unspecified chronic kidney disease: Secondary | ICD-10-CM | POA: Diagnosis not present

## 2022-11-18 DIAGNOSIS — Z794 Long term (current) use of insulin: Secondary | ICD-10-CM | POA: Diagnosis not present

## 2022-11-18 DIAGNOSIS — K7581 Nonalcoholic steatohepatitis (NASH): Secondary | ICD-10-CM | POA: Diagnosis not present

## 2022-11-18 DIAGNOSIS — N1831 Chronic kidney disease, stage 3a: Secondary | ICD-10-CM | POA: Diagnosis not present

## 2022-11-18 DIAGNOSIS — S92901D Unspecified fracture of right foot, subsequent encounter for fracture with routine healing: Secondary | ICD-10-CM | POA: Diagnosis not present

## 2022-11-18 DIAGNOSIS — J309 Allergic rhinitis, unspecified: Secondary | ICD-10-CM | POA: Diagnosis not present

## 2022-11-18 DIAGNOSIS — Z9181 History of falling: Secondary | ICD-10-CM | POA: Diagnosis not present

## 2022-11-24 DIAGNOSIS — E785 Hyperlipidemia, unspecified: Secondary | ICD-10-CM | POA: Diagnosis not present

## 2022-11-24 DIAGNOSIS — K7581 Nonalcoholic steatohepatitis (NASH): Secondary | ICD-10-CM | POA: Diagnosis not present

## 2022-11-24 DIAGNOSIS — K219 Gastro-esophageal reflux disease without esophagitis: Secondary | ICD-10-CM | POA: Diagnosis not present

## 2022-11-24 DIAGNOSIS — J309 Allergic rhinitis, unspecified: Secondary | ICD-10-CM | POA: Diagnosis not present

## 2022-11-24 DIAGNOSIS — Z9181 History of falling: Secondary | ICD-10-CM | POA: Diagnosis not present

## 2022-11-24 DIAGNOSIS — Z794 Long term (current) use of insulin: Secondary | ICD-10-CM | POA: Diagnosis not present

## 2022-11-24 DIAGNOSIS — E11319 Type 2 diabetes mellitus with unspecified diabetic retinopathy without macular edema: Secondary | ICD-10-CM | POA: Diagnosis not present

## 2022-11-24 DIAGNOSIS — N1831 Chronic kidney disease, stage 3a: Secondary | ICD-10-CM | POA: Diagnosis not present

## 2022-11-24 DIAGNOSIS — M109 Gout, unspecified: Secondary | ICD-10-CM | POA: Diagnosis not present

## 2022-11-24 DIAGNOSIS — E1122 Type 2 diabetes mellitus with diabetic chronic kidney disease: Secondary | ICD-10-CM | POA: Diagnosis not present

## 2022-11-24 DIAGNOSIS — S92901D Unspecified fracture of right foot, subsequent encounter for fracture with routine healing: Secondary | ICD-10-CM | POA: Diagnosis not present

## 2022-11-24 DIAGNOSIS — B372 Candidiasis of skin and nail: Secondary | ICD-10-CM | POA: Diagnosis not present

## 2022-11-24 DIAGNOSIS — I129 Hypertensive chronic kidney disease with stage 1 through stage 4 chronic kidney disease, or unspecified chronic kidney disease: Secondary | ICD-10-CM | POA: Diagnosis not present

## 2022-11-24 DIAGNOSIS — E559 Vitamin D deficiency, unspecified: Secondary | ICD-10-CM | POA: Diagnosis not present

## 2022-11-24 DIAGNOSIS — E039 Hypothyroidism, unspecified: Secondary | ICD-10-CM | POA: Diagnosis not present

## 2022-11-24 DIAGNOSIS — K7460 Unspecified cirrhosis of liver: Secondary | ICD-10-CM | POA: Diagnosis not present

## 2022-11-24 DIAGNOSIS — K649 Unspecified hemorrhoids: Secondary | ICD-10-CM | POA: Diagnosis not present

## 2022-11-25 ENCOUNTER — Inpatient Hospital Stay: Payer: Medicare Other

## 2022-11-25 VITALS — BP 112/57 | HR 71 | Temp 96.9°F | Resp 18

## 2022-11-25 DIAGNOSIS — D631 Anemia in chronic kidney disease: Secondary | ICD-10-CM | POA: Diagnosis not present

## 2022-11-25 DIAGNOSIS — N1832 Chronic kidney disease, stage 3b: Secondary | ICD-10-CM | POA: Diagnosis not present

## 2022-11-25 DIAGNOSIS — D500 Iron deficiency anemia secondary to blood loss (chronic): Secondary | ICD-10-CM

## 2022-11-25 LAB — CBC
HCT: 29.9 % — ABNORMAL LOW (ref 36.0–46.0)
Hemoglobin: 9.6 g/dL — ABNORMAL LOW (ref 12.0–15.0)
MCH: 33.1 pg (ref 26.0–34.0)
MCHC: 32.1 g/dL (ref 30.0–36.0)
MCV: 103.1 fL — ABNORMAL HIGH (ref 80.0–100.0)
Platelets: 52 10*3/uL — ABNORMAL LOW (ref 150–400)
RBC: 2.9 MIL/uL — ABNORMAL LOW (ref 3.87–5.11)
RDW: 14.4 % (ref 11.5–15.5)
WBC: 6.8 10*3/uL (ref 4.0–10.5)
nRBC: 0 % (ref 0.0–0.2)

## 2022-11-25 MED ORDER — EPOETIN ALFA-EPBX 10000 UNIT/ML IJ SOLN
10000.0000 [IU] | Freq: Once | INTRAMUSCULAR | Status: AC
  Administered 2022-11-25: 10000 [IU] via SUBCUTANEOUS
  Filled 2022-11-25: qty 1

## 2022-11-25 NOTE — Patient Instructions (Signed)

## 2022-11-25 NOTE — Progress Notes (Signed)
Patient tolerated injection with no complaints voiced.  Site clean and dry with no bruising or swelling noted at site.  See MAR for details.  Band aid applied.  Patient stable during and after injection.  Vss with discharge and left in satisfactory condition with no s/s of distress noted.  

## 2022-11-26 DIAGNOSIS — Z9181 History of falling: Secondary | ICD-10-CM | POA: Diagnosis not present

## 2022-11-26 DIAGNOSIS — E039 Hypothyroidism, unspecified: Secondary | ICD-10-CM | POA: Diagnosis not present

## 2022-11-26 DIAGNOSIS — S92901D Unspecified fracture of right foot, subsequent encounter for fracture with routine healing: Secondary | ICD-10-CM | POA: Diagnosis not present

## 2022-11-26 DIAGNOSIS — I129 Hypertensive chronic kidney disease with stage 1 through stage 4 chronic kidney disease, or unspecified chronic kidney disease: Secondary | ICD-10-CM | POA: Diagnosis not present

## 2022-11-26 DIAGNOSIS — E1122 Type 2 diabetes mellitus with diabetic chronic kidney disease: Secondary | ICD-10-CM | POA: Diagnosis not present

## 2022-11-26 DIAGNOSIS — E11319 Type 2 diabetes mellitus with unspecified diabetic retinopathy without macular edema: Secondary | ICD-10-CM | POA: Diagnosis not present

## 2022-11-26 DIAGNOSIS — E559 Vitamin D deficiency, unspecified: Secondary | ICD-10-CM | POA: Diagnosis not present

## 2022-11-26 DIAGNOSIS — K219 Gastro-esophageal reflux disease without esophagitis: Secondary | ICD-10-CM | POA: Diagnosis not present

## 2022-11-26 DIAGNOSIS — K7581 Nonalcoholic steatohepatitis (NASH): Secondary | ICD-10-CM | POA: Diagnosis not present

## 2022-11-26 DIAGNOSIS — K649 Unspecified hemorrhoids: Secondary | ICD-10-CM | POA: Diagnosis not present

## 2022-11-26 DIAGNOSIS — J309 Allergic rhinitis, unspecified: Secondary | ICD-10-CM | POA: Diagnosis not present

## 2022-11-26 DIAGNOSIS — E785 Hyperlipidemia, unspecified: Secondary | ICD-10-CM | POA: Diagnosis not present

## 2022-11-26 DIAGNOSIS — N1831 Chronic kidney disease, stage 3a: Secondary | ICD-10-CM | POA: Diagnosis not present

## 2022-11-26 DIAGNOSIS — B372 Candidiasis of skin and nail: Secondary | ICD-10-CM | POA: Diagnosis not present

## 2022-11-26 DIAGNOSIS — Z794 Long term (current) use of insulin: Secondary | ICD-10-CM | POA: Diagnosis not present

## 2022-11-26 DIAGNOSIS — K7460 Unspecified cirrhosis of liver: Secondary | ICD-10-CM | POA: Diagnosis not present

## 2022-11-26 DIAGNOSIS — M109 Gout, unspecified: Secondary | ICD-10-CM | POA: Diagnosis not present

## 2022-11-27 ENCOUNTER — Other Ambulatory Visit (HOSPITAL_COMMUNITY): Payer: Medicare Other

## 2022-12-01 ENCOUNTER — Inpatient Hospital Stay (HOSPITAL_COMMUNITY): Admission: RE | Admit: 2022-12-01 | Payer: Medicare Other | Source: Ambulatory Visit

## 2022-12-01 DIAGNOSIS — R059 Cough, unspecified: Secondary | ICD-10-CM | POA: Diagnosis not present

## 2022-12-01 DIAGNOSIS — K7460 Unspecified cirrhosis of liver: Secondary | ICD-10-CM | POA: Diagnosis not present

## 2022-12-01 DIAGNOSIS — R509 Fever, unspecified: Secondary | ICD-10-CM | POA: Diagnosis not present

## 2022-12-02 ENCOUNTER — Ambulatory Visit (HOSPITAL_COMMUNITY)
Admission: RE | Admit: 2022-12-02 | Discharge: 2022-12-02 | Disposition: A | Discharge status: Home / Self Care | Payer: Medicare Other | Source: Ambulatory Visit | Attending: Gastroenterology | Admitting: Gastroenterology

## 2022-12-02 ENCOUNTER — Ambulatory Visit (HOSPITAL_COMMUNITY): Payer: Medicare Other

## 2022-12-02 ENCOUNTER — Encounter (HOSPITAL_COMMUNITY): Payer: Self-pay

## 2022-12-02 DIAGNOSIS — R188 Other ascites: Secondary | ICD-10-CM | POA: Insufficient documentation

## 2022-12-02 MED ORDER — ALBUMIN HUMAN 25 % IV SOLN
INTRAVENOUS | Status: AC
  Filled 2022-12-02: qty 100

## 2022-12-02 MED ORDER — LIDOCAINE HCL (PF) 2 % IJ SOLN
INTRAMUSCULAR | Status: AC
  Filled 2022-12-02: qty 10

## 2022-12-02 MED ORDER — ALBUMIN HUMAN 25 % IV SOLN
25.0000 g | Freq: Once | INTRAVENOUS | Status: AC
  Administered 2022-12-02: 25 g via INTRAVENOUS

## 2022-12-02 MED ORDER — LIDOCAINE HCL (PF) 2 % IJ SOLN
10.0000 mL | Freq: Once | INTRAMUSCULAR | Status: AC
  Administered 2022-12-02: 10 mL

## 2022-12-02 NOTE — Progress Notes (Signed)
Patient tolerated left sided paracentesis and 25G of IV albumin well today and 4 Liters of clear yellow ascites well today. Patient verbalized understanding of discharge instructions and left via wheelchair with no acute distress noted.

## 2022-12-03 DIAGNOSIS — I509 Heart failure, unspecified: Secondary | ICD-10-CM | POA: Diagnosis not present

## 2022-12-03 DIAGNOSIS — Z9181 History of falling: Secondary | ICD-10-CM | POA: Diagnosis not present

## 2022-12-03 DIAGNOSIS — E1122 Type 2 diabetes mellitus with diabetic chronic kidney disease: Secondary | ICD-10-CM | POA: Diagnosis not present

## 2022-12-03 DIAGNOSIS — S92901D Unspecified fracture of right foot, subsequent encounter for fracture with routine healing: Secondary | ICD-10-CM | POA: Diagnosis not present

## 2022-12-03 DIAGNOSIS — E039 Hypothyroidism, unspecified: Secondary | ICD-10-CM | POA: Diagnosis not present

## 2022-12-03 DIAGNOSIS — E559 Vitamin D deficiency, unspecified: Secondary | ICD-10-CM | POA: Diagnosis not present

## 2022-12-03 DIAGNOSIS — M109 Gout, unspecified: Secondary | ICD-10-CM | POA: Diagnosis not present

## 2022-12-03 DIAGNOSIS — K7460 Unspecified cirrhosis of liver: Secondary | ICD-10-CM | POA: Diagnosis not present

## 2022-12-03 DIAGNOSIS — N184 Chronic kidney disease, stage 4 (severe): Secondary | ICD-10-CM | POA: Diagnosis not present

## 2022-12-03 DIAGNOSIS — K649 Unspecified hemorrhoids: Secondary | ICD-10-CM | POA: Diagnosis not present

## 2022-12-03 DIAGNOSIS — N1831 Chronic kidney disease, stage 3a: Secondary | ICD-10-CM | POA: Diagnosis not present

## 2022-12-03 DIAGNOSIS — Z515 Encounter for palliative care: Secondary | ICD-10-CM | POA: Diagnosis not present

## 2022-12-03 DIAGNOSIS — I130 Hypertensive heart and chronic kidney disease with heart failure and stage 1 through stage 4 chronic kidney disease, or unspecified chronic kidney disease: Secondary | ICD-10-CM | POA: Diagnosis not present

## 2022-12-03 DIAGNOSIS — E11319 Type 2 diabetes mellitus with unspecified diabetic retinopathy without macular edema: Secondary | ICD-10-CM | POA: Diagnosis not present

## 2022-12-03 DIAGNOSIS — E785 Hyperlipidemia, unspecified: Secondary | ICD-10-CM | POA: Diagnosis not present

## 2022-12-03 DIAGNOSIS — K7581 Nonalcoholic steatohepatitis (NASH): Secondary | ICD-10-CM | POA: Diagnosis not present

## 2022-12-03 DIAGNOSIS — Z794 Long term (current) use of insulin: Secondary | ICD-10-CM | POA: Diagnosis not present

## 2022-12-03 DIAGNOSIS — B372 Candidiasis of skin and nail: Secondary | ICD-10-CM | POA: Diagnosis not present

## 2022-12-03 DIAGNOSIS — K219 Gastro-esophageal reflux disease without esophagitis: Secondary | ICD-10-CM | POA: Diagnosis not present

## 2022-12-03 DIAGNOSIS — K766 Portal hypertension: Secondary | ICD-10-CM | POA: Diagnosis not present

## 2022-12-07 DIAGNOSIS — K219 Gastro-esophageal reflux disease without esophagitis: Secondary | ICD-10-CM | POA: Diagnosis not present

## 2022-12-07 DIAGNOSIS — B372 Candidiasis of skin and nail: Secondary | ICD-10-CM | POA: Diagnosis not present

## 2022-12-07 DIAGNOSIS — Z794 Long term (current) use of insulin: Secondary | ICD-10-CM | POA: Diagnosis not present

## 2022-12-07 DIAGNOSIS — E785 Hyperlipidemia, unspecified: Secondary | ICD-10-CM | POA: Diagnosis not present

## 2022-12-07 DIAGNOSIS — K7581 Nonalcoholic steatohepatitis (NASH): Secondary | ICD-10-CM | POA: Diagnosis not present

## 2022-12-07 DIAGNOSIS — E11319 Type 2 diabetes mellitus with unspecified diabetic retinopathy without macular edema: Secondary | ICD-10-CM | POA: Diagnosis not present

## 2022-12-07 DIAGNOSIS — E1122 Type 2 diabetes mellitus with diabetic chronic kidney disease: Secondary | ICD-10-CM | POA: Diagnosis not present

## 2022-12-07 DIAGNOSIS — N1831 Chronic kidney disease, stage 3a: Secondary | ICD-10-CM | POA: Diagnosis not present

## 2022-12-07 DIAGNOSIS — S92901D Unspecified fracture of right foot, subsequent encounter for fracture with routine healing: Secondary | ICD-10-CM | POA: Diagnosis not present

## 2022-12-07 DIAGNOSIS — E039 Hypothyroidism, unspecified: Secondary | ICD-10-CM | POA: Diagnosis not present

## 2022-12-07 DIAGNOSIS — K649 Unspecified hemorrhoids: Secondary | ICD-10-CM | POA: Diagnosis not present

## 2022-12-07 DIAGNOSIS — E559 Vitamin D deficiency, unspecified: Secondary | ICD-10-CM | POA: Diagnosis not present

## 2022-12-07 DIAGNOSIS — M109 Gout, unspecified: Secondary | ICD-10-CM | POA: Diagnosis not present

## 2022-12-07 DIAGNOSIS — Z9181 History of falling: Secondary | ICD-10-CM | POA: Diagnosis not present

## 2022-12-07 DIAGNOSIS — K7460 Unspecified cirrhosis of liver: Secondary | ICD-10-CM | POA: Diagnosis not present

## 2022-12-07 DIAGNOSIS — I130 Hypertensive heart and chronic kidney disease with heart failure and stage 1 through stage 4 chronic kidney disease, or unspecified chronic kidney disease: Secondary | ICD-10-CM | POA: Diagnosis not present

## 2022-12-07 DIAGNOSIS — I509 Heart failure, unspecified: Secondary | ICD-10-CM | POA: Diagnosis not present

## 2022-12-08 NOTE — Progress Notes (Unsigned)
[facility] [address], [place] [address]   CLINIC:  Medical Oncology/Hematology  PCP:  Benita Stabile, MD [address] [place] [address] [phone]   REASON FOR VISIT:  Follow-up for thrombocytopenia and iron deficiency anemia in the setting of underlying cirrhosis   CURRENT THERAPY: Intermittent IV iron infusions (last Feraheme on 02/08/2020)  INTERVAL HISTORY:   [name] 72 y.o. female returns for routine follow-up of her thrombocytopenia and iron deficiency anemia in the setting of underlying liver cirrhosis.  She was last seen by Rojelio Brenner PA-C on 10/15/2022.  After last visit, she was started on Retacrit injections. She is tolerating Retacrit well without any major side effects.  Blood pressure has been ***.  No symptoms concerning for DVT or PE.  *** *** Energy?  *** She admits to easy bruising, but denies any petechial rash.   She has not noticed any blood loss such as epistaxis, bleeding gums, hematemesis, hematochezia, melena, or hematuria. *** She has not had any pica, headaches, chest pain, or syncope.  She reports shortness of breath associated with "fluid building up in her stomach" that is improved after she has paracentesis.   She has ***% energy and ***% appetite. She endorses that she is maintaining a stable weight.  ASSESSMENT & PLAN:  1.  Moderate thrombocytopenia: - Had mild thrombocytopenia (platelets >100) since 2015.  Platelets have been <100 since 2019.  Baseline platelets over the past 3 years have been 40,000 to 60,000. - She has NASH with cirrhosis and splenomegaly. - CT abdomen/pelvis (renal protocol) on 09/04/2022 showed hepatic cirrhosis with mild splenomegaly and ascites. - DIFFERENTIAL DIAGNOSIS favors thrombocytopenia secondary to splenomegaly, less likely to be immune mediated thrombocytopenia or possible MDS. - Nutritional deficiency work-up was negative.  ANA was 1:80 speckled pattern positive, although  weakly.  She does not have any clinical signs or symptoms of lupus.  We considered it likely to be false-positive. - Denies any current signs or symptoms of bleeding does have some mild easy bruising without petechial rash   *** - CBC today (12/09/2022): *** - PLAN:   Platelets remained stable at baseline.  No indication for treatment at this time.  Continue surveillance.  2.  Normocytic anemia: - Anemia secondary to iron deficiency and anemia of chronic disease.  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 - Unable to tolerate oral iron due to GI upset - Most recent IV iron was Feraheme on 02/05/2020 without much improvement in energy - EGD (09/26/2021): Large esophageal varices, partially eradicated with banding.  Mild portal hypertensive gastropathy. - Repeat EGD (10/31/2021): Large varices with banding x 5, partially eradicated varices.  Mild portal hypertensive gastropathy. - She denies any gross blood loss, rectal hemorrhage or melena     *** - Retacrit initiated 10/27/2022 (10,000 units every 2 weeks) ***.  She is tolerating this well.  *** - Labs today (12/09/2022): *** - PLAN: *** Retacrit *** - *** Iron *** - RTC ***  3.  B12 deficiency - Patient was started on vitamin B12 supplementation in September 2022 due to elevated methylmalonic acid.  Stopped by endocrinology in November 2023. - Most recent labs (10/06/2022): Vitamin B12 elevated at 1173, MMA elevated 509.  Elevation in both B12 and MMA may be due to worsening CKD. - PLAN: No indication for B12 supplementation at this time.  Will recheck in 6 to 12 months.  PLAN SUMMARY: *** Not reviewed *** >> CBC +  Retacrit 10,000 units every 2 weeks  >> Same-day labs (CBC/D, ferritin, iron/TIBC, CMP) + injection + OFFICE visit in 4 to 6 weeks (same day as third scheduled Retacrit injection)     REVIEW OF SYSTEMS: ***  Review of Systems  Constitutional:  Positive for fatigue. Negative for appetite change, chills, diaphoresis, fever and  unexpected weight change.  HENT:   Negative for lump/mass and nosebleeds.   Eyes:  Negative for eye problems.  Respiratory:  Negative for cough, hemoptysis and shortness of breath.   Cardiovascular:  Negative for chest pain, leg swelling and palpitations.  Gastrointestinal:  Negative for abdominal pain, blood in stool, constipation, diarrhea, nausea and vomiting.  Genitourinary:  Negative for hematuria.   Skin: Negative.   Neurological:  Positive for dizziness and numbness. Negative for headaches and light-headedness.  Hematological:  Does not bruise/bleed easily.  Psychiatric/Behavioral:  Positive for sleep disturbance.      PHYSICAL EXAM:  ECOG PERFORMANCE STATUS: 3 - Symptomatic, >50% confined to bed *** There were no vitals filed for this visit.  There were no vitals filed for this visit.  Physical Exam Constitutional:      Appearance: Normal appearance. She is obese.  Cardiovascular:     Heart sounds: Normal heart sounds.  Pulmonary:     Breath sounds: Normal breath sounds.  Musculoskeletal:     Right lower leg: Edema present.     Left lower leg: Edema present.  Neurological:     General: No focal deficit present.     Mental Status: Mental status is at baseline.  Psychiatric:        Behavior: Behavior normal. Behavior is cooperative.     PAST MEDICAL/SURGICAL HISTORY:  Past Medical History:  Diagnosis Date   Alkaline phosphatase elevation    Anemia    Anemia in stage 4 chronic kidney disease (HCC) 10/15/2022   ASD (atrial septal defect)    Cataract    both eyes hx of   Chest pain    03-17-2013 last chest pain   Chronic kidney disease    Stage III kidney disease   Cirrhosis (HCC)    Depression    Diabetes mellitus type II    Family history of adverse reaction to anesthesia    Son hard to wake up   Fatigue    GERD (gastroesophageal reflux disease)    Gout    Headache(784.0)    occasional   Hyperlipidemia    Hypertension    Hypothyroidism     Neuropathy    Osteopenia    Peripheral neuropathy    Presence of pessary    Retinopathy    Past Surgical History:  Procedure Laterality Date   APPENDECTOMY     CATARACT EXTRACTION     CERVICAL SPINE SURGERY  2006   CHOLECYSTECTOMY N/A 04/07/2013   Procedure: LAPAROSCOPIC CHOLECYSTECTOMY WITH INTRAOPERATIVE CHOLANGIOGRAM;  Surgeon: Robyne Askew, MD;  Location: [facility];  Service: General;  Laterality: N/A;   COMBINED HYSTERECTOMY VAGINAL / OOPHORECTOMY / A&P REPAIR  1987   Unilateral oophorectomy, h/o uterine prolapse has right ovary   ERCP N/A 04/06/2013   Procedure: ENDOSCOPIC RETROGRADE CHOLANGIOPANCREATOGRAPHY (ERCP);  Surgeon: Theda Belfast, MD;  Location: [facility];  Service: Endoscopy;  Laterality: N/A;   ESOPHAGEAL BANDING N/A 09/26/2021   Procedure: ESOPHAGEAL BANDING;  Surgeon: Jeani Hawking, MD;  Location: [facility];  Service: Gastroenterology;  Laterality: N/A;   ESOPHAGEAL BANDING N/A 10/31/2021   Procedure: ESOPHAGEAL BANDING;  Surgeon: Jeani Hawking, MD;  Location: [facility];  Service: Gastroenterology;  Laterality: N/A;   ESOPHAGOGASTRODUODENOSCOPY (EGD) WITH PROPOFOL N/A 08/16/2020   Procedure: ESOPHAGOGASTRODUODENOSCOPY (EGD) WITH PROPOFOL;  Surgeon: Jeani Hawking, MD;  Location: [facility];  Service: Endoscopy;  Laterality: N/A;   ESOPHAGOGASTRODUODENOSCOPY (EGD) WITH PROPOFOL N/A 09/26/2021   Procedure: ESOPHAGOGASTRODUODENOSCOPY (EGD) WITH PROPOFOL;  Surgeon: Jeani Hawking, MD;  Location: [facility];  Service: Gastroenterology;  Laterality: N/A;   ESOPHAGOGASTRODUODENOSCOPY (EGD) WITH PROPOFOL N/A 10/31/2021   Procedure: ESOPHAGOGASTRODUODENOSCOPY (EGD) WITH PROPOFOL;  Surgeon: Jeani Hawking, MD;  Location: [facility];  Service: Gastroenterology;  Laterality: N/A;   EUS N/A 03/31/2013   Procedure: UPPER ENDOSCOPIC ULTRASOUND (EUS) LINEAR;  Surgeon: Theda Belfast, MD;  Location: [facility];  Service: Endoscopy;  Laterality: N/A;   REFRACTIVE SURGERY      SPINAL FUSION     c4-c7   TONSILLECTOMY  age 62    SOCIAL HISTORY:  Social History   Socioeconomic History   Marital status: Divorced    Spouse name: Not on file   Number of children: 3   Years of education: 12   Highest education level: Not on file  Occupational History   Occupation: [employer]-Retired    Associate Professor: [employer]  Tobacco Use   Smoking status: Never   Smokeless tobacco: Never  Vaping Use   Vaping status: Never Used  Substance and Sexual Activity   Alcohol use: No   Drug use: No   Sexual activity: Not Currently    Birth control/protection: Surgical    Comment: hyst  Other Topics Concern   Not on file  Social History Narrative   Divorced   Lives alone. Reports that her son recently moved out after living with her for a long time. She reports that she is happy to be living alone and feels like she was enabling his behavior. Reports that he had a history of drug use.   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    No pets.   Eats all food groups.    Wears seat belt.    Lives in house.    Smoke detectors.    Social Determinants of Health   Financial Resource Strain: Medium Risk (10/30/2019)   Overall Financial Resource Strain (CARDIA)    Difficulty of Paying Living Expenses: Somewhat hard  Food Insecurity: No Food Insecurity (10/23/2022)   Hunger Vital Sign    Worried About Running Out of Food in the Last Year: Never true    Ran Out of Food in the Last Year: Never true  Transportation Needs: No Transportation Needs (10/23/2022)   PRAPARE - Administrator, Civil Service (Medical): No    Lack of Transportation (Non-Medical): No  Physical Activity: Inactive (10/30/2019)   Exercise Vital Sign    Days of Exercise per Week: 0 days    Minutes of Exercise per Session: 0 min  Stress: No Stress Concern Present (10/30/2019)   [employer] of Occupational Health - Occupational Stress  Questionnaire    Feeling of Stress : Not at all  Social Connections: Moderately Integrated (10/30/2019)   Social Connection and Isolation Panel [NHANES]    Frequency of Communication with Friends and Family: More than three times a week    Frequency of Social Gatherings with Friends and Family: Three times a week    Attends Religious Services: More than 4 times per year    Active Member of  Clubs or Organizations: Yes    Attends Banker Meetings: 1 to 4 times per year    Marital Status: Divorced  Intimate Partner Violence: Not At Risk (09/03/2022)   Humiliation, Afraid, Rape, and Kick questionnaire    Fear of Current or Ex-Partner: No    Emotionally Abused: No    Physically Abused: No    Sexually Abused: No    FAMILY HISTORY:  Family History  Problem Relation Age of Onset   COPD Mother    Lung cancer Father    Diabetes Sister    Cataracts Sister    Insulin resistance Daughter    Insulin resistance Son     CURRENT MEDICATIONS:  Outpatient Encounter Medications as of 12/09/2022  Medication Sig   acetaminophen (TYLENOL) 325 MG tablet Take 2 tablets (650 mg total) by mouth every 6 (six) hours as needed for mild pain (or Fever >/= 101).   allopurinol (ZYLOPRIM) 100 MG tablet Take 100 mg by mouth every evening.    insulin regular (NOVOLIN R) 100 units/mL injection Inject 5-8 Units into the skin 3 (three) times daily before meals. Sliding scale 0-250=0 251-300=5 301-350=6 351-400=7units 401-450 8 units   levothyroxine (SYNTHROID) 125 MCG tablet 1 tablet in the morning on an empty stomach Orally Once a day for 30 day(s)   NOVOLIN N RELION 100 UNIT/ML injection Inject 14 Units into the skin 2 (two) times daily.   ONETOUCH ULTRA test strip    propranolol (INDERAL) 40 MG tablet Take 0.5 tablets (20 mg total) by mouth daily.   sodium bicarbonate 650 MG tablet Take 2 tablets (1,300 mg total) by mouth 2 (two) times daily.   Vitamin D, Ergocalciferol, (DRISDOL) 1.25 MG (50000 UT)  CAPS capsule Take 50,000 Units by mouth every Saturday.   No facility-administered encounter medications on file as of 12/09/2022.    ALLERGIES:  Allergies  Allergen Reactions   Sulfamethoxazole-Trimethoprim Nausea And Vomiting and Nausea Only    Other Reaction(s): Dizziness   Atorvastatin     arthralgia/myalgia  Other Reaction(s): arthralgia/myalgia    arthralgia/myalgia   Ciprofloxacin     severe yeast infection  Other Reaction(s): severe yeast infection    severe yeast infection   Codeine     Other Reaction(s): Unknown-Unspecified   Colesevelam     arthralgia/myalgia  Other Reaction(s): arthralgia/myalgia    arthralgia/myalgia   Ezetimibe     arthralgia/myalgia  Other Reaction(s): Unknown    arthralgia/myalgia   Lisinopril Cough   Losartan Potassium Cough   Lovastatin     arthralgia/myalgia  Other Reaction(s): arthralgia/myalgia    arthralgia/myalgia   Pravastatin     Other Reaction(s): arthralgia/myalgia   Rosuvastatin     arthralgia/myalgia  Other Reaction(s): Muscle Pain  Other Reaction(s): arthralgia/myalgia    REACTION: diarrhea    arthralgia/myalgia   Statins Other (See Comments)    arthralgia/myalgia   Dilaudid [Hydromorphone Hcl] Nausea And Vomiting    After one vomiting episode no further vomiting   Hydromorphone Nausea And Vomiting and Nausea Only    Other Reaction(s): GI Intolerance    After one vomiting episode no further vomiting    LABORATORY DATA:  I have reviewed the labs as listed.  CBC    Component Value Date/Time   WBC 6.8 11/25/2022 1028   RBC 2.90 (L) 11/25/2022 1028   HGB 9.6 (L) 11/25/2022 1028   HCT 29.9 (L) 11/25/2022 1028   PLT 52 (L) 11/25/2022 1028   MCV 103.1 (H) 11/25/2022 1028   MCH  33.1 11/25/2022 1028   MCHC 32.1 11/25/2022 1028   RDW 14.4 11/25/2022 1028   LYMPHSABS 0.8 10/06/2022 1045   MONOABS 0.4 10/06/2022 1045   EOSABS 0.3 10/06/2022 1045   BASOSABS 0.0 10/06/2022 1045      Latest Ref Rng &  Units 10/06/2022   10:45 AM 09/10/2022    4:29 AM 09/09/2022    4:10 AM  CMP  Glucose 70 - 99 mg/dL 409  811  914   BUN 8 - 23 mg/dL 40  72  73   Creatinine 0.44 - 1.00 mg/dL 7.82  9.56  2.13   Sodium 135 - 145 mmol/L 136  130  129   Potassium 3.5 - 5.1 mmol/L 3.8  4.0  4.1   Chloride 98 - 111 mmol/L 103  103  102   CO2 22 - 32 mmol/L 27  18  18    Calcium 8.9 - 10.3 mg/dL 8.1  8.3  8.0   Total Protein 6.5 - 8.1 g/dL 5.8     Total Bilirubin 0.3 - 1.2 mg/dL 1.6     Alkaline Phos 38 - 126 U/L 130     AST 15 - 41 U/L 29     ALT 0 - 44 U/L 18       DIAGNOSTIC IMAGING:  I have independently reviewed the relevant imaging and discussed with the patient.   WRAP UP:  All questions were answered. The patient knows to call the clinic with any problems, questions or concerns.  Medical decision making: Moderate***  Time spent on visit: I spent 20 minutes counseling the patient face to face. The total time spent in the appointment was 30 minutes and more than 50% was on counseling.  Carnella Guadalajara, PA-C  ***

## 2022-12-09 ENCOUNTER — Inpatient Hospital Stay: Payer: Medicare Other

## 2022-12-09 ENCOUNTER — Inpatient Hospital Stay: Payer: Medicare Other | Attending: Physician Assistant | Admitting: Physician Assistant

## 2022-12-09 VITALS — BP 125/64 | HR 63 | Temp 98.2°F | Resp 18 | Wt 213.0 lb

## 2022-12-09 DIAGNOSIS — D509 Iron deficiency anemia, unspecified: Secondary | ICD-10-CM | POA: Diagnosis not present

## 2022-12-09 DIAGNOSIS — D631 Anemia in chronic kidney disease: Secondary | ICD-10-CM | POA: Diagnosis not present

## 2022-12-09 DIAGNOSIS — D500 Iron deficiency anemia secondary to blood loss (chronic): Secondary | ICD-10-CM

## 2022-12-09 DIAGNOSIS — I129 Hypertensive chronic kidney disease with stage 1 through stage 4 chronic kidney disease, or unspecified chronic kidney disease: Secondary | ICD-10-CM | POA: Diagnosis not present

## 2022-12-09 DIAGNOSIS — E1122 Type 2 diabetes mellitus with diabetic chronic kidney disease: Secondary | ICD-10-CM | POA: Insufficient documentation

## 2022-12-09 DIAGNOSIS — D696 Thrombocytopenia, unspecified: Secondary | ICD-10-CM

## 2022-12-09 DIAGNOSIS — N184 Chronic kidney disease, stage 4 (severe): Secondary | ICD-10-CM | POA: Diagnosis not present

## 2022-12-09 DIAGNOSIS — E538 Deficiency of other specified B group vitamins: Secondary | ICD-10-CM

## 2022-12-09 LAB — CBC WITH DIFFERENTIAL/PLATELET
Abs Immature Granulocytes: 0.01 10*3/uL (ref 0.00–0.07)
Basophils Absolute: 0 10*3/uL (ref 0.0–0.1)
Basophils Relative: 1 %
Eosinophils Absolute: 0.4 10*3/uL (ref 0.0–0.5)
Eosinophils Relative: 7 %
HCT: 31.9 % — ABNORMAL LOW (ref 36.0–46.0)
Hemoglobin: 9.9 g/dL — ABNORMAL LOW (ref 12.0–15.0)
Immature Granulocytes: 0 %
Lymphocytes Relative: 19 %
Lymphs Abs: 1.1 10*3/uL (ref 0.7–4.0)
MCH: 32 pg (ref 26.0–34.0)
MCHC: 31 g/dL (ref 30.0–36.0)
MCV: 103.2 fL — ABNORMAL HIGH (ref 80.0–100.0)
Monocytes Absolute: 0.3 10*3/uL (ref 0.1–1.0)
Monocytes Relative: 6 %
Neutro Abs: 3.9 10*3/uL (ref 1.7–7.7)
Neutrophils Relative %: 67 %
Platelets: 69 10*3/uL — ABNORMAL LOW (ref 150–400)
RBC: 3.09 MIL/uL — ABNORMAL LOW (ref 3.87–5.11)
RDW: 14.6 % (ref 11.5–15.5)
WBC: 5.7 10*3/uL (ref 4.0–10.5)
nRBC: 0 % (ref 0.0–0.2)

## 2022-12-09 LAB — FERRITIN: Ferritin: 63 ng/mL (ref 11–307)

## 2022-12-09 LAB — IRON AND TIBC
Iron: 83 ug/dL (ref 28–170)
Saturation Ratios: 44 % — ABNORMAL HIGH (ref 10.4–31.8)
TIBC: 190 ug/dL — ABNORMAL LOW (ref 250–450)
UIBC: 107 ug/dL

## 2022-12-09 LAB — COMPREHENSIVE METABOLIC PANEL
ALT: 16 U/L (ref 0–44)
AST: 23 U/L (ref 15–41)
Albumin: 2.4 g/dL — ABNORMAL LOW (ref 3.5–5.0)
Alkaline Phosphatase: 101 U/L (ref 38–126)
Anion gap: 7 (ref 5–15)
BUN: 50 mg/dL — ABNORMAL HIGH (ref 8–23)
CO2: 29 mmol/L (ref 22–32)
Calcium: 8 mg/dL — ABNORMAL LOW (ref 8.9–10.3)
Chloride: 99 mmol/L (ref 98–111)
Creatinine, Ser: 1.87 mg/dL — ABNORMAL HIGH (ref 0.44–1.00)
GFR, Estimated: 29 mL/min — ABNORMAL LOW (ref >60)
Glucose, Bld: 257 mg/dL — ABNORMAL HIGH (ref 70–99)
Potassium: 4.1 mmol/L (ref 3.5–5.1)
Sodium: 135 mmol/L (ref 135–145)
Total Bilirubin: 1.3 mg/dL — ABNORMAL HIGH (ref 0.3–1.2)
Total Protein: 5.8 g/dL — ABNORMAL LOW (ref 6.5–8.1)

## 2022-12-09 MED ORDER — EPOETIN ALFA-EPBX 10000 UNIT/ML IJ SOLN
10000.0000 [IU] | Freq: Once | INTRAMUSCULAR | Status: AC
  Administered 2022-12-09: 10000 [IU] via SUBCUTANEOUS
  Filled 2022-12-09: qty 1

## 2022-12-09 NOTE — Patient Instructions (Addendum)
[facility] at [facility] **VISIT SUMMARY & IMPORTANT INSTRUCTIONS **   You were seen today by Rojelio Brenner PA-C for your low platelets and anemia.    LOW PLATELETS Your low platelets are due to your liver cirrhosis and enlarged spleen. Although your platelets are low, they do not need any treatment at this time. We will continue monitoring of your platelets.  ANEMIA: You have anemia (low red blood cells) related to your chronic kidney disease. Your iron level is are low.  We will schedule you for IV iron x 2 doses, which will be given on the weeks in between your Retacrit shots. We will continue your Retacrit injections every 2 weeks to treat anemia related to chronic kidney disease.  FOLLOW-UP APPOINTMENT: Office visit in 3 months   - - - - - - - - - - - - - - - - - -  ** Thank you for trusting me with your healthcare!  I strive to provide all of my patients with quality care at each visit.  If you receive a survey for this visit, I would be so grateful to you for taking the time to provide feedback.  Thank you in advance!  ~ Al Bracewell                   Dr. Doreatha Massed   &   Rojelio Brenner, PA-C   - - - - - - - - - - - - - - - - - -    Thank you for choosing [facility] at [facility] to provide your oncology and hematology care.  To afford each patient quality time with our provider, please arrive at least 15 minutes before your scheduled appointment time.   If you have a lab appointment with the Cancer Center please come in thru the Main Entrance and check in at the main information desk.  You need to re-schedule your appointment should you arrive 10 or more minutes late.  We strive to give you quality time with our providers, and arriving late affects you and other patients whose appointments are after yours.  Also, if you no show three or more times for appointments you may be dismissed from the clinic at the providers  discretion.     Again, thank you for choosing [facility].  Our hope is that these requests will decrease the amount of time that you wait before being seen by our physicians.       _____________________________________________________________  Should you have questions after your visit to [facility], please contact our office at [phone] and follow the prompts.  Our office hours are 8:00 a.m. and 4:30 p.m. Monday - Friday.  Please note that voicemails left after 4:00 p.m. may not be returned until the following business day.  We are closed weekends and major holidays.  You do have access to a nurse 24-7, just call the main number to the clinic [phone] and do not press any options, hold on the line and a nurse will answer the phone.    For prescription refill requests, have your pharmacy contact our office and allow 72 hours.

## 2022-12-09 NOTE — Patient Instructions (Signed)
[facility]  Discharge Instructions: Thank you for choosing [facility] to provide your oncology and hematology care.  If you have a lab appointment with the Cancer Center - please note that after April 8th, 2024, all labs will be drawn in the cancer center.  You do not have to check in or register with the main entrance as you have in the past but will complete your check-in in the cancer center.  Wear comfortable clothing and clothing appropriate for easy access to any Portacath or PICC line.   We strive to give you quality time with your provider. You may need to reschedule your appointment if you arrive late (15 or more minutes).  Arriving late affects you and other patients whose appointments are after yours.  Also, if you miss three or more appointments without notifying the office, you may be dismissed from the clinic at the provider's discretion.      For prescription refill requests, have your pharmacy contact our office and allow 72 hours for refills to be completed.    Today you received the following Retacrit.  Epoetin Alfa Injection What is this medication? EPOETIN ALFA (e POE e tin AL fa) treats low levels of red blood cells (anemia) caused by kidney disease, chemotherapy, or HIV medications. It can also be used in people who are at risk for blood loss during surgery. It works by Systems analyst make more red blood cells, which reduces the need for blood transfusions. This medicine may be used for other purposes; ask your health care provider or pharmacist if you have questions. COMMON BRAND NAME(S): Epogen, Procrit, Retacrit What should I tell my care team before I take this medication? They need to know if you have any of these conditions: Blood clots Cancer Heart disease High blood pressure On dialysis Seizures Stroke An unusual or allergic reaction to epoetin alfa, albumin, benzyl alcohol, other medications, foods, dyes, or  preservatives Pregnant or trying to get pregnant Breast-feeding How should I use this medication? This medication is injected into a vein or under the skin. It is usually given by your care team in a hospital or clinic setting. It may also be given at home. If you get this medication at home, you will be taught how to prepare and give it. Use exactly as directed. Take it as directed on the prescription label at the same time every day. Keep taking it unless your care team tells you to stop. It is important that you put your used needles and syringes in a special sharps container. Do not put them in a trash can. If you do not have a sharps container, call your pharmacist or care team to get one. A special MedGuide will be given to you by the pharmacist with each prescription and refill. Be sure to read this information carefully each time. Talk to your care team about the use of this medication in children. While this medication may be used in children as young as 1 month of age for selected conditions, precautions do apply. Overdosage: If you think you have taken too much of this medicine contact a poison control center or emergency room at once. NOTE: This medicine is only for you. Do not share this medicine with others. What if I miss a dose? If you miss a dose, take it as soon as you can. If it is almost time for your next dose, take only that dose. Do not take double or  extra doses. What may interact with this medication? Darbepoetin alfa Methoxy polyethylene glycol-epoetin beta This list may not describe all possible interactions. Give your health care provider a list of all the medicines, herbs, non-prescription drugs, or dietary supplements you use. Also tell them if you smoke, drink alcohol, or use illegal drugs. Some items may interact with your medicine. What should I watch for while using this medication? Visit your care team for regular checks on your progress. Check your blood pressure  as directed. Know what your blood pressure should be and when to contact your care team. Your condition will be monitored carefully while you are receiving this medication. You may need blood work while taking this medication. What side effects may I notice from receiving this medication? Side effects that you should report to your care team as soon as possible: Allergic reactions--skin rash, itching, hives, swelling of the face, lips, tongue, or throat Blood clot--pain, swelling, or warmth in the leg, shortness of breath, chest pain Heart attack--pain or tightness in the chest, shoulders, arms, or jaw, nausea, shortness of breath, cold or clammy skin, feeling faint or lightheaded Increase in blood pressure Rash, fever, and swollen lymph nodes Redness, blistering, peeling, or loosening of the skin, including inside the mouth Seizures Stroke--sudden numbness or weakness of the face, arm, or leg, trouble speaking, confusion, trouble walking, loss of balance or coordination, dizziness, severe headache, change in vision Side effects that usually do not require medical attention (report to your care team if they continue or are bothersome): Bone, joint, or muscle pain Cough Headache Nausea Pain, redness, or irritation at injection site This list may not describe all possible side effects. Call your doctor for medical advice about side effects. You may report side effects to FDA at 1-800-FDA-1088. Where should I keep my medication? Keep out of the reach of children and pets. Store in a refrigerator. Do not freeze. Do not shake. Protect from light. Keep this medication in the original container until you are ready to take it. See product for storage information. Get rid of any unused medication after the expiration date. To get rid of medications that are no longer needed or have expired: Take the medication to a medication take-back program. Check with your pharmacy or law enforcement to find a  location. If you cannot return the medication, ask your pharmacist or care team how to get rid of the medication safely. NOTE: This sheet is a summary. It may not cover all possible information. If you have questions about this medicine, talk to your doctor, pharmacist, or health care provider.  2024 Elsevier/Gold Standard (2021-07-18 00:00:00)       To help prevent nausea and vomiting after your treatment, we encourage you to take your nausea medication as directed.  BELOW ARE SYMPTOMS THAT SHOULD BE REPORTED IMMEDIATELY: *FEVER GREATER THAN 100.4 F (38 C) OR HIGHER *CHILLS OR SWEATING *NAUSEA AND VOMITING THAT IS NOT CONTROLLED WITH YOUR NAUSEA MEDICATION *UNUSUAL SHORTNESS OF BREATH *UNUSUAL BRUISING OR BLEEDING *URINARY PROBLEMS (pain or burning when urinating, or frequent urination) *BOWEL PROBLEMS (unusual diarrhea, constipation, pain near the anus) TENDERNESS IN MOUTH AND THROAT WITH OR WITHOUT PRESENCE OF ULCERS (sore throat, sores in mouth, or a toothache) UNUSUAL RASH, SWELLING OR PAIN  UNUSUAL VAGINAL DISCHARGE OR ITCHING   Items with * indicate a potential emergency and should be followed up as soon as possible or go to the Emergency Department if any problems should occur.  Please show the CHEMOTHERAPY ALERT CARD  or IMMUNOTHERAPY ALERT CARD at check-in to the Emergency Department and triage nurse.  Should you have questions after your visit or need to cancel or reschedule your appointment, please contact [facility]ENTER AT [facility] [phone]  and follow the prompts.  Office hours are 8:00 a.m. to 4:30 p.m. Monday - Friday. Please note that voicemails left after 4:00 p.m. may not be returned until the following business day.  We are closed weekends and major holidays. You have access to a nurse at all times for urgent questions. Please call the main number to the clinic [phone] and follow the prompts.  For any non-urgent questions, you may also contact your  provider using MyChart. We now offer e-Visits for anyone 25 and older to request care online for non-urgent symptoms. For details visit [email or web address].   Also download the MyChart app! Go to the app store, search "MyChart", open the app, select [place], and log in with your MyChart username and password.

## 2022-12-09 NOTE — Progress Notes (Signed)
Patient Retacrit injection. Hgb 9.9. Patient  tolerated injection in right arm with no complaints voiced.  Site clean and dry with no bruising or swelling noted.  No complaints of pain.  Discharged with vital signs stable and no signs or symptoms of distress noted.

## 2022-12-10 ENCOUNTER — Ambulatory Visit (HOSPITAL_COMMUNITY): Admission: RE | Admit: 2022-12-10 | Payer: Medicare Other | Source: Ambulatory Visit

## 2022-12-11 ENCOUNTER — Other Ambulatory Visit (HOSPITAL_COMMUNITY): Payer: Self-pay | Admitting: Gastroenterology

## 2022-12-11 ENCOUNTER — Ambulatory Visit (HOSPITAL_COMMUNITY)
Admission: RE | Admit: 2022-12-11 | Discharge: 2022-12-11 | Disposition: A | Discharge status: Home / Self Care | Payer: Medicare Other | Source: Ambulatory Visit | Attending: Gastroenterology | Admitting: Gastroenterology

## 2022-12-11 ENCOUNTER — Encounter (HOSPITAL_COMMUNITY): Payer: Self-pay

## 2022-12-11 DIAGNOSIS — R188 Other ascites: Secondary | ICD-10-CM

## 2022-12-11 MED ORDER — LIDOCAINE HCL (PF) 2 % IJ SOLN
INTRAMUSCULAR | Status: AC
  Filled 2022-12-11: qty 10

## 2022-12-11 MED ORDER — LIDOCAINE HCL (PF) 2 % IJ SOLN
10.0000 mL | Freq: Once | INTRAMUSCULAR | Status: AC
  Administered 2022-12-11: 10 mL

## 2022-12-11 MED ORDER — ALBUMIN HUMAN 25 % IV SOLN
INTRAVENOUS | Status: AC
  Filled 2022-12-11: qty 100

## 2022-12-11 MED ORDER — ALBUMIN HUMAN 25 % IV SOLN
25.0000 g | Freq: Once | INTRAVENOUS | Status: AC
  Administered 2022-12-11: 25 g via INTRAVENOUS

## 2022-12-11 NOTE — Procedures (Signed)
Interventional Radiology Procedure Note  Procedure: US guided paracentesis.   Complications: None Recommendations:  - Ok to shower tomorrow - Do not submerge for 7 days - Routine wound care   Signed,  Yvone Neu. Loreta Ave, DO

## 2022-12-11 NOTE — Progress Notes (Signed)
Patient tolerated right sided paracentesis procedure and 25G of IV albumin well today and 4 Liters of clear yellow ascites removed. Patient left via wheelchair with daughter with no acute distress noted and voiced understanding of discharge instructions.

## 2022-12-14 ENCOUNTER — Encounter: Payer: Self-pay | Admitting: Hematology

## 2022-12-14 ENCOUNTER — Ambulatory Visit (HOSPITAL_COMMUNITY)
Admission: RE | Admit: 2022-12-14 | Discharge: 2022-12-14 | Disposition: A | Discharge status: Home / Self Care | Payer: Medicare Other | Source: Ambulatory Visit | Attending: Gastroenterology | Admitting: Gastroenterology

## 2022-12-14 ENCOUNTER — Encounter (HOSPITAL_COMMUNITY): Payer: Self-pay

## 2022-12-14 DIAGNOSIS — K7460 Unspecified cirrhosis of liver: Secondary | ICD-10-CM | POA: Diagnosis not present

## 2022-12-14 DIAGNOSIS — R188 Other ascites: Secondary | ICD-10-CM | POA: Insufficient documentation

## 2022-12-14 DIAGNOSIS — N1831 Chronic kidney disease, stage 3a: Secondary | ICD-10-CM | POA: Diagnosis not present

## 2022-12-14 DIAGNOSIS — E782 Mixed hyperlipidemia: Secondary | ICD-10-CM | POA: Diagnosis not present

## 2022-12-14 MED ORDER — ALBUMIN HUMAN 25 % IV SOLN
INTRAVENOUS | Status: AC
  Filled 2022-12-14: qty 100

## 2022-12-14 MED ORDER — LIDOCAINE HCL (PF) 2 % IJ SOLN
INTRAMUSCULAR | Status: AC
  Filled 2022-12-14: qty 10

## 2022-12-14 MED ORDER — ALBUMIN HUMAN 25 % IV SOLN
25.0000 g | Freq: Once | INTRAVENOUS | Status: AC
  Administered 2022-12-14: 25 g via INTRAVENOUS

## 2022-12-14 MED ORDER — LIDOCAINE HCL (PF) 2 % IJ SOLN
10.0000 mL | Freq: Once | INTRAMUSCULAR | Status: AC
  Administered 2022-12-14: 10 mL

## 2022-12-14 NOTE — Progress Notes (Signed)
Patient tolerated right sided paracentesis and 25G of IV albumin well today and 4 Liters of clear yellow ascites removed. Patient verbalized understanding of discharge instructions and left via wheelchair with family with no acute distress noted.

## 2022-12-17 ENCOUNTER — Inpatient Hospital Stay: Payer: Medicare Other

## 2022-12-17 VITALS — BP 118/52 | HR 63 | Temp 97.8°F | Resp 18

## 2022-12-17 DIAGNOSIS — I129 Hypertensive chronic kidney disease with stage 1 through stage 4 chronic kidney disease, or unspecified chronic kidney disease: Secondary | ICD-10-CM | POA: Diagnosis not present

## 2022-12-17 DIAGNOSIS — D631 Anemia in chronic kidney disease: Secondary | ICD-10-CM | POA: Diagnosis not present

## 2022-12-17 DIAGNOSIS — N1831 Chronic kidney disease, stage 3a: Secondary | ICD-10-CM | POA: Diagnosis not present

## 2022-12-17 DIAGNOSIS — D509 Iron deficiency anemia, unspecified: Secondary | ICD-10-CM | POA: Diagnosis not present

## 2022-12-17 DIAGNOSIS — N184 Chronic kidney disease, stage 4 (severe): Secondary | ICD-10-CM | POA: Diagnosis not present

## 2022-12-17 DIAGNOSIS — E1122 Type 2 diabetes mellitus with diabetic chronic kidney disease: Secondary | ICD-10-CM | POA: Diagnosis not present

## 2022-12-17 MED ORDER — SODIUM CHLORIDE 0.9 % IV SOLN
510.0000 mg | Freq: Once | INTRAVENOUS | Status: AC
  Administered 2022-12-17: 510 mg via INTRAVENOUS
  Filled 2022-12-17: qty 510

## 2022-12-17 MED ORDER — CETIRIZINE HCL 10 MG PO TABS
10.0000 mg | ORAL_TABLET | Freq: Once | ORAL | Status: AC
  Administered 2022-12-17: 10 mg via ORAL
  Filled 2022-12-17: qty 1

## 2022-12-17 MED ORDER — SODIUM CHLORIDE 0.9 % IV SOLN: Freq: Once | INTRAVENOUS | Status: AC

## 2022-12-17 NOTE — Progress Notes (Signed)
Patient presents today for iron infusion Feraheme.  Patient is in satisfactory condition with no new complaints voiced.  Vital signs are stable.  We will proceed with infusion per provider orders.    Patient tolerated treatment well with no complaints voiced.  Patient left via wheelchair in stable condition.  Vital signs stable at discharge.  Follow up as scheduled.

## 2022-12-17 NOTE — Patient Instructions (Signed)
[facility]  Discharge Instructions: Thank you for choosing [facility] to provide your oncology and hematology care.  If you have a lab appointment with the Cancer Center - please note that after April 8th, 2024, all labs will be drawn in the cancer center.  You do not have to check in or register with the main entrance as you have in the past but will complete your check-in in the cancer center.  Wear comfortable clothing and clothing appropriate for easy access to any Portacath or PICC line.   We strive to give you quality time with your provider. You may need to reschedule your appointment if you arrive late (15 or more minutes).  Arriving late affects you and other patients whose appointments are after yours.  Also, if you miss three or more appointments without notifying the office, you may be dismissed from the clinic at the provider's discretion.      For prescription refill requests, have your pharmacy contact our office and allow 72 hours for refills to be completed.    Today you received the following Feraheme infusion.  Ferumoxytol Injection What is this medication? FERUMOXYTOL (FER ue MOX i tol) treats low levels of iron in your body (iron deficiency anemia). Iron is a mineral that plays an important role in making red blood cells, which carry oxygen from your lungs to the rest of your body. This medicine may be used for other purposes; ask your health care provider or pharmacist if you have questions. COMMON BRAND NAME(S): Feraheme What should I tell my care team before I take this medication? They need to know if you have any of these conditions: Anemia not caused by low iron levels High levels of iron in the blood Magnetic resonance imaging (MRI) test scheduled An unusual or allergic reaction to iron, other medications, foods, dyes, or preservatives Pregnant or trying to get pregnant Breastfeeding How should I use this medication? This  medication is injected into a vein. It is given by your care team in a hospital or clinic setting. Talk to your care team the use of this medication in children. Special care may be needed. Overdosage: If you think you have taken too much of this medicine contact a poison control center or emergency room at once. NOTE: This medicine is only for you. Do not share this medicine with others. What if I miss a dose? It is important not to miss your dose. Call your care team if you are unable to keep an appointment. What may interact with this medication? Other iron products This list may not describe all possible interactions. Give your health care provider a list of all the medicines, herbs, non-prescription drugs, or dietary supplements you use. Also tell them if you smoke, drink alcohol, or use illegal drugs. Some items may interact with your medicine. What should I watch for while using this medication? Visit your care team regularly. Tell your care team if your symptoms do not start to get better or if they get worse. You may need blood work done while you are taking this medication. You may need to follow a special diet. Talk to your care team. Foods that contain iron include: whole grains/cereals, dried fruits, beans, or peas, leafy green vegetables, and organ meats (liver, kidney). What side effects may I notice from receiving this medication? Side effects that you should report to your care team as soon as possible: Allergic reactions--skin rash, itching, hives, swelling of the  face, lips, tongue, or throat Low blood pressure--dizziness, feeling faint or lightheaded, blurry vision Shortness of breath Side effects that usually do not require medical attention (report to your care team if they continue or are bothersome): Flushing Headache Joint pain Muscle pain Nausea Pain, redness, or irritation at injection site This list may not describe all possible side effects. Call your doctor for  medical advice about side effects. You may report side effects to FDA at 1-800-FDA-1088. Where should I keep my medication? This medication is given in a hospital or clinic. It will not be stored at home. NOTE: This sheet is a summary. It may not cover all possible information. If you have questions about this medicine, talk to your doctor, pharmacist, or health care provider.  2024 Elsevier/Gold Standard (2022-08-21 00:00:00)   To help prevent nausea and vomiting after your treatment, we encourage you to take your nausea medication as directed.  BELOW ARE SYMPTOMS THAT SHOULD BE REPORTED IMMEDIATELY: *FEVER GREATER THAN 100.4 F (38 C) OR HIGHER *CHILLS OR SWEATING *NAUSEA AND VOMITING THAT IS NOT CONTROLLED WITH YOUR NAUSEA MEDICATION *UNUSUAL SHORTNESS OF BREATH *UNUSUAL BRUISING OR BLEEDING *URINARY PROBLEMS (pain or burning when urinating, or frequent urination) *BOWEL PROBLEMS (unusual diarrhea, constipation, pain near the anus) TENDERNESS IN MOUTH AND THROAT WITH OR WITHOUT PRESENCE OF ULCERS (sore throat, sores in mouth, or a toothache) UNUSUAL RASH, SWELLING OR PAIN  UNUSUAL VAGINAL DISCHARGE OR ITCHING   Items with * indicate a potential emergency and should be followed up as soon as possible or go to the Emergency Department if any problems should occur.  Please show the CHEMOTHERAPY ALERT CARD or IMMUNOTHERAPY ALERT CARD at check-in to the Emergency Department and triage nurse.  Should you have questions after your visit or need to cancel or reschedule your appointment, please contact [facility] CENTER AT [facility] [phone]  and follow the prompts.  Office hours are 8:00 a.m. to 4:30 p.m. Monday - Friday. Please note that voicemails left after 4:00 p.m. may not be returned until the following business day.  We are closed weekends and major holidays. You have access to a nurse at all times for urgent questions. Please call the main number to the clinic [phone] and  follow the prompts.  For any non-urgent questions, you may also contact your provider using MyChart. We now offer e-Visits for anyone 42 and older to request care online for non-urgent symptoms. For details visit [email or web address].   Also download the MyChart app! Go to the app store, search "MyChart", open the app, select [place], and log in with your MyChart username and password.

## 2022-12-21 DIAGNOSIS — Z9181 History of falling: Secondary | ICD-10-CM | POA: Diagnosis not present

## 2022-12-21 DIAGNOSIS — K219 Gastro-esophageal reflux disease without esophagitis: Secondary | ICD-10-CM | POA: Diagnosis not present

## 2022-12-21 DIAGNOSIS — N1831 Chronic kidney disease, stage 3a: Secondary | ICD-10-CM | POA: Diagnosis not present

## 2022-12-21 DIAGNOSIS — B372 Candidiasis of skin and nail: Secondary | ICD-10-CM | POA: Diagnosis not present

## 2022-12-21 DIAGNOSIS — E785 Hyperlipidemia, unspecified: Secondary | ICD-10-CM | POA: Diagnosis not present

## 2022-12-21 DIAGNOSIS — K649 Unspecified hemorrhoids: Secondary | ICD-10-CM | POA: Diagnosis not present

## 2022-12-21 DIAGNOSIS — E1122 Type 2 diabetes mellitus with diabetic chronic kidney disease: Secondary | ICD-10-CM | POA: Diagnosis not present

## 2022-12-21 DIAGNOSIS — E11319 Type 2 diabetes mellitus with unspecified diabetic retinopathy without macular edema: Secondary | ICD-10-CM | POA: Diagnosis not present

## 2022-12-21 DIAGNOSIS — I130 Hypertensive heart and chronic kidney disease with heart failure and stage 1 through stage 4 chronic kidney disease, or unspecified chronic kidney disease: Secondary | ICD-10-CM | POA: Diagnosis not present

## 2022-12-21 DIAGNOSIS — K7581 Nonalcoholic steatohepatitis (NASH): Secondary | ICD-10-CM | POA: Diagnosis not present

## 2022-12-21 DIAGNOSIS — K7460 Unspecified cirrhosis of liver: Secondary | ICD-10-CM | POA: Diagnosis not present

## 2022-12-21 DIAGNOSIS — I509 Heart failure, unspecified: Secondary | ICD-10-CM | POA: Diagnosis not present

## 2022-12-21 DIAGNOSIS — M109 Gout, unspecified: Secondary | ICD-10-CM | POA: Diagnosis not present

## 2022-12-21 DIAGNOSIS — Z794 Long term (current) use of insulin: Secondary | ICD-10-CM | POA: Diagnosis not present

## 2022-12-21 DIAGNOSIS — E039 Hypothyroidism, unspecified: Secondary | ICD-10-CM | POA: Diagnosis not present

## 2022-12-21 DIAGNOSIS — S92901D Unspecified fracture of right foot, subsequent encounter for fracture with routine healing: Secondary | ICD-10-CM | POA: Diagnosis not present

## 2022-12-21 DIAGNOSIS — E559 Vitamin D deficiency, unspecified: Secondary | ICD-10-CM | POA: Diagnosis not present

## 2022-12-22 DIAGNOSIS — E782 Mixed hyperlipidemia: Secondary | ICD-10-CM | POA: Diagnosis not present

## 2022-12-22 DIAGNOSIS — K219 Gastro-esophageal reflux disease without esophagitis: Secondary | ICD-10-CM | POA: Diagnosis not present

## 2022-12-22 DIAGNOSIS — E559 Vitamin D deficiency, unspecified: Secondary | ICD-10-CM | POA: Diagnosis not present

## 2022-12-22 DIAGNOSIS — E039 Hypothyroidism, unspecified: Secondary | ICD-10-CM | POA: Diagnosis not present

## 2022-12-22 DIAGNOSIS — D696 Thrombocytopenia, unspecified: Secondary | ICD-10-CM | POA: Diagnosis not present

## 2022-12-22 DIAGNOSIS — M109 Gout, unspecified: Secondary | ICD-10-CM | POA: Diagnosis not present

## 2022-12-22 DIAGNOSIS — J309 Allergic rhinitis, unspecified: Secondary | ICD-10-CM | POA: Diagnosis not present

## 2022-12-22 DIAGNOSIS — E11319 Type 2 diabetes mellitus with unspecified diabetic retinopathy without macular edema: Secondary | ICD-10-CM | POA: Diagnosis not present

## 2022-12-22 DIAGNOSIS — K7460 Unspecified cirrhosis of liver: Secondary | ICD-10-CM | POA: Diagnosis not present

## 2022-12-22 DIAGNOSIS — Z0000 Encounter for general adult medical examination without abnormal findings: Secondary | ICD-10-CM | POA: Diagnosis not present

## 2022-12-22 DIAGNOSIS — N1831 Chronic kidney disease, stage 3a: Secondary | ICD-10-CM | POA: Diagnosis not present

## 2022-12-23 ENCOUNTER — Ambulatory Visit (HOSPITAL_COMMUNITY)
Admission: RE | Admit: 2022-12-23 | Discharge: 2022-12-23 | Disposition: A | Discharge status: Home / Self Care | Payer: Medicare Other | Source: Ambulatory Visit | Attending: Gastroenterology | Admitting: Gastroenterology

## 2022-12-23 DIAGNOSIS — R188 Other ascites: Secondary | ICD-10-CM | POA: Insufficient documentation

## 2022-12-23 DIAGNOSIS — K7581 Nonalcoholic steatohepatitis (NASH): Secondary | ICD-10-CM | POA: Diagnosis not present

## 2022-12-23 MED ORDER — ALBUMIN HUMAN 25 % IV SOLN
INTRAVENOUS | Status: AC
  Filled 2022-12-23: qty 100

## 2022-12-23 MED ORDER — ALBUMIN HUMAN 25 % IV SOLN
12.5000 g | Freq: Once | INTRAVENOUS | Status: AC
  Administered 2022-12-23: 25 g via INTRAVENOUS

## 2022-12-23 MED ORDER — LIDOCAINE HCL (PF) 2 % IJ SOLN
INTRAMUSCULAR | Status: AC
  Filled 2022-12-23: qty 20

## 2022-12-23 MED ORDER — LIDOCAINE HCL (PF) 2 % IJ SOLN
10.0000 mL | Freq: Once | INTRAMUSCULAR | Status: AC
  Administered 2022-12-23: 10 mL via INTRADERMAL

## 2022-12-23 NOTE — Progress Notes (Signed)
Pt transported to [place] rm per WC. Procedure explaine, consent obtained. Prepped/draped in sterile manner. Local anesthetic admin without adverse reaction. Access obtained at [facility] without complication. Connected to vacutainer suction, clear serous fluid draining. Albumin admin per order. 4L obtained. Access removed without complication, dermabond applied, no bleeding, no hematoma. Bandage applied. Transferred to [facility], escorted to waiting area to care of family.

## 2022-12-23 NOTE — Procedures (Signed)
PROCEDURE SUMMARY:  Successful ultrasound guided paracentesis from the left lower quadrant.  Yielded 4.0 L of clear yellow fluid.  No immediate complications.  The patient tolerated the procedure well.   Specimen was sent for labs.  EBL < 5mL  The patient has required >/=2 paracenteses in a 30 day period and a screening evaluation by the [facility] Interventional Radiology [facility]pert[facility] has been arranged.  Lynnette Caffey, PA-C

## 2022-12-24 ENCOUNTER — Inpatient Hospital Stay: Payer: Medicare Other

## 2022-12-24 VITALS — BP 111/48 | HR 65 | Temp 97.7°F | Resp 18

## 2022-12-24 DIAGNOSIS — N184 Chronic kidney disease, stage 4 (severe): Secondary | ICD-10-CM | POA: Diagnosis not present

## 2022-12-24 DIAGNOSIS — I129 Hypertensive chronic kidney disease with stage 1 through stage 4 chronic kidney disease, or unspecified chronic kidney disease: Secondary | ICD-10-CM | POA: Diagnosis not present

## 2022-12-24 DIAGNOSIS — E1122 Type 2 diabetes mellitus with diabetic chronic kidney disease: Secondary | ICD-10-CM | POA: Diagnosis not present

## 2022-12-24 DIAGNOSIS — D631 Anemia in chronic kidney disease: Secondary | ICD-10-CM | POA: Diagnosis not present

## 2022-12-24 DIAGNOSIS — D509 Iron deficiency anemia, unspecified: Secondary | ICD-10-CM | POA: Diagnosis not present

## 2022-12-24 MED ORDER — EPOETIN ALFA-EPBX 10000 UNIT/ML IJ SOLN
10000.0000 [IU] | Freq: Once | INTRAMUSCULAR | Status: AC
  Administered 2022-12-24: 10000 [IU] via SUBCUTANEOUS
  Filled 2022-12-24: qty 1

## 2022-12-24 NOTE — Patient Instructions (Addendum)
[facility]  Discharge Instructions: Thank you for choosing [facility] to provide your oncology and hematology care.  If you have a lab appointment with the Cancer Center - please note that after April 8th, 2024, all labs will be drawn in the cancer center.  You do not have to check in or register with the main entrance as you have in the past but will complete your check-in in the cancer center.  Wear comfortable clothing and clothing appropriate for easy access to any Portacath or PICC line.   We strive to give you quality time with your provider. You may need to reschedule your appointment if you arrive late (15 or more minutes).  Arriving late affects you and other patients whose appointments are after yours.  Also, if you miss three or more appointments without notifying the office, you may be dismissed from the clinic at the provider's discretion.      For prescription refill requests, have your pharmacy contact our office and allow 72 hours for refills to be completed.    Today you received the following injection:Retacrit   To help prevent nausea and vomiting after your treatment, we encourage you to take your nausea medication as directed.  BELOW ARE SYMPTOMS THAT SHOULD BE REPORTED IMMEDIATELY: *FEVER GREATER THAN 100.4 F (38 C) OR HIGHER *CHILLS OR SWEATING *NAUSEA AND VOMITING THAT IS NOT CONTROLLED WITH YOUR NAUSEA MEDICATION *UNUSUAL SHORTNESS OF BREATH *UNUSUAL BRUISING OR BLEEDING *URINARY PROBLEMS (pain or burning when urinating, or frequent urination) *BOWEL PROBLEMS (unusual diarrhea, constipation, pain near the anus) TENDERNESS IN MOUTH AND THROAT WITH OR WITHOUT PRESENCE OF ULCERS (sore throat, sores in mouth, or a toothache) UNUSUAL RASH, SWELLING OR PAIN  UNUSUAL VAGINAL DISCHARGE OR ITCHING   Items with * indicate a potential emergency and should be followed up as soon as possible or go to the Emergency Department if any problems  should occur.  Please show the CHEMOTHERAPY ALERT CARD or IMMUNOTHERAPY ALERT CARD at check-in to the Emergency Department and triage nurse.  Should you have questions after your visit or need to cancel or reschedule your appointment, please contact [facility] AT [facility] [phone]  and follow the prompts.  Office hours are 8:00 a.m. to 4:30 p.m. Monday - Friday. Please note that voicemails left after 4:00 p.m. may not be returned until the following business day.  We are closed weekends and major holidays. You have access to a nurse at all times for urgent questions. Please call the main number to the clinic [phone] and follow the prompts.  For any non-urgent questions, you may also contact your provider using MyChart. We now offer e-Visits for anyone 64 and older to request care online for non-urgent symptoms. For details visit [email or web address].   Also download the MyChart app! Go to the app store, search "MyChart", open the app, select , and log in with your MyChart username and password.

## 2022-12-24 NOTE — Progress Notes (Signed)
Retacrit injection given per orders. Patient tolerated it well without problems. Vitals stable and discharged home from clinic via wheelchair. Follow up as scheduled.

## 2022-12-28 ENCOUNTER — Other Ambulatory Visit (HOSPITAL_COMMUNITY): Payer: Self-pay | Admitting: Gastroenterology

## 2022-12-28 DIAGNOSIS — R188 Other ascites: Secondary | ICD-10-CM

## 2022-12-31 ENCOUNTER — Inpatient Hospital Stay: Payer: Medicare Other | Attending: Hematology

## 2022-12-31 ENCOUNTER — Ambulatory Visit (HOSPITAL_COMMUNITY): Admission: RE | Admit: 2022-12-31 | Payer: Medicare Other | Source: Ambulatory Visit

## 2022-12-31 VITALS — BP 106/56 | HR 68 | Temp 98.2°F | Resp 18

## 2022-12-31 DIAGNOSIS — I129 Hypertensive chronic kidney disease with stage 1 through stage 4 chronic kidney disease, or unspecified chronic kidney disease: Secondary | ICD-10-CM | POA: Insufficient documentation

## 2022-12-31 DIAGNOSIS — E1122 Type 2 diabetes mellitus with diabetic chronic kidney disease: Secondary | ICD-10-CM | POA: Diagnosis not present

## 2022-12-31 DIAGNOSIS — N184 Chronic kidney disease, stage 4 (severe): Secondary | ICD-10-CM | POA: Insufficient documentation

## 2022-12-31 DIAGNOSIS — D509 Iron deficiency anemia, unspecified: Secondary | ICD-10-CM | POA: Diagnosis not present

## 2022-12-31 DIAGNOSIS — E538 Deficiency of other specified B group vitamins: Secondary | ICD-10-CM | POA: Diagnosis not present

## 2022-12-31 DIAGNOSIS — D631 Anemia in chronic kidney disease: Secondary | ICD-10-CM | POA: Insufficient documentation

## 2022-12-31 MED ORDER — CETIRIZINE HCL 10 MG PO TABS
10.0000 mg | ORAL_TABLET | Freq: Once | ORAL | Status: AC
  Administered 2022-12-31: 10 mg via ORAL
  Filled 2022-12-31: qty 1

## 2022-12-31 MED ORDER — SODIUM CHLORIDE 0.9 % IV SOLN: Freq: Once | INTRAVENOUS | Status: AC

## 2022-12-31 MED ORDER — SODIUM CHLORIDE 0.9 % IV SOLN
510.0000 mg | Freq: Once | INTRAVENOUS | Status: AC
  Administered 2022-12-31: 510 mg via INTRAVENOUS
  Filled 2022-12-31: qty 510

## 2022-12-31 NOTE — Progress Notes (Signed)
Patient presents today for Feraheme iron infusion. Vital signs are stable. Patient states her last iron she could not tell a difference and had complaint's of fatigue. Patient states after her last iron she experienced diarrhea that resolved on it's own.   Feraheme given today per MD orders. Tolerated infusion without adverse affects. Vital signs stable. No complaints at this time. Discharged from clinic ambulatory in stable condition. Alert and oriented x 3. F/U with [facility] as scheduled.

## 2022-12-31 NOTE — Patient Instructions (Signed)
 [facility]  Discharge Instructions: Thank you for choosing [facility] to provide your oncology and hematology care.  If you have a lab appointment with the Cancer Center - please note that after April 8th, 2024, all labs will be drawn in the cancer center.  You do not have to check in or register with the main entrance as you have in the past but will complete your check-in in the cancer center.  Wear comfortable clothing and clothing appropriate for easy access to any Portacath or PICC line.   We strive to give you quality time with your provider. You may need to reschedule your appointment if you arrive late (15 or more minutes).  Arriving late affects you and other patients whose appointments are after yours.  Also, if you miss three or more appointments without notifying the office, you may be dismissed from the clinic at the provider's discretion.      For prescription refill requests, have your pharmacy contact our office and allow 72 hours for refills to be completed.    Today you received the following chemotherapy and/or immunotherapy agents Feraheme. Ferumoxytol Injection What is this medication? FERUMOXYTOL (FER ue MOX i tol) treats low levels of iron in your body (iron deficiency anemia). Iron is a mineral that plays an important role in making red blood cells, which carry oxygen from your lungs to the rest of your body. This medicine may be used for other purposes; ask your health care provider or pharmacist if you have questions. COMMON BRAND NAME(S): Feraheme What should I tell my care team before I take this medication? They need to know if you have any of these conditions: Anemia not caused by low iron levels High levels of iron in the blood Magnetic resonance imaging (MRI) test scheduled An unusual or allergic reaction to iron, other medications, foods, dyes, or preservatives Pregnant or trying to get pregnant Breastfeeding How should I  use this medication? This medication is injected into a vein. It is given by your care team in a hospital or clinic setting. Talk to your care team the use of this medication in children. Special care may be needed. Overdosage: If you think you have taken too much of this medicine contact a poison control center or emergency room at once. NOTE: This medicine is only for you. Do not share this medicine with others. What if I miss a dose? It is important not to miss your dose. Call your care team if you are unable to keep an appointment. What may interact with this medication? Other iron products This list may not describe all possible interactions. Give your health care provider a list of all the medicines, herbs, non-prescription drugs, or dietary supplements you use. Also tell them if you smoke, drink alcohol, or use illegal drugs. Some items may interact with your medicine. What should I watch for while using this medication? Visit your care team regularly. Tell your care team if your symptoms do not start to get better or if they get worse. You may need blood work done while you are taking this medication. You may need to follow a special diet. Talk to your care team. Foods that contain iron include: whole grains/cereals, dried fruits, beans, or peas, leafy green vegetables, and organ meats (liver, kidney). What side effects may I notice from receiving this medication? Side effects that you should report to your care team as soon as possible: Allergic reactions--skin rash, itching, hives, swelling  of the face, lips, tongue, or throat Low blood pressure--dizziness, feeling faint or lightheaded, blurry vision Shortness of breath Side effects that usually do not require medical attention (report to your care team if they continue or are bothersome): Flushing Headache Joint pain Muscle pain Nausea Pain, redness, or irritation at injection site This list may not describe all possible side  effects. Call your doctor for medical advice about side effects. You may report side effects to FDA at 1-800-FDA-1088. Where should I keep my medication? This medication is given in a hospital or clinic. It will not be stored at home. NOTE: This sheet is a summary. It may not cover all possible information. If you have questions about this medicine, talk to your doctor, pharmacist, or health care provider.  2024 Elsevier/Gold Standard (2022-08-21 00:00:00)       To help prevent nausea and vomiting after your treatment, we encourage you to take your nausea medication as directed.  BELOW ARE SYMPTOMS THAT SHOULD BE REPORTED IMMEDIATELY: *FEVER GREATER THAN 100.4 F (38 C) OR HIGHER *CHILLS OR SWEATING *NAUSEA AND VOMITING THAT IS NOT CONTROLLED WITH YOUR NAUSEA MEDICATION *UNUSUAL SHORTNESS OF BREATH *UNUSUAL BRUISING OR BLEEDING *URINARY PROBLEMS (pain or burning when urinating, or frequent urination) *BOWEL PROBLEMS (unusual diarrhea, constipation, pain near the anus) TENDERNESS IN MOUTH AND THROAT WITH OR WITHOUT PRESENCE OF ULCERS (sore throat, sores in mouth, or a toothache) UNUSUAL RASH, SWELLING OR PAIN  UNUSUAL VAGINAL DISCHARGE OR ITCHING   Items with * indicate a potential emergency and should be followed up as soon as possible or go to the Emergency Department if any problems should occur.  Please show the CHEMOTHERAPY ALERT CARD or IMMUNOTHERAPY ALERT CARD at check-in to the Emergency Department and triage nurse.  Should you have questions after your visit or need to cancel or reschedule your appointment, please contact [facility] [phone]  and follow the prompts.  Office hours are 8:00 a.m. to 4:30 p.m. Monday - Friday. Please note that voicemails left after 4:00 p.m. may not be returned until the following business day.  We are closed weekends and major holidays. You have access to a nurse at all times for urgent questions. Please call the main number  to the clinic [phone] and follow the prompts.  For any non-urgent questions, you may also contact your provider using MyChart. We now offer e-Visits for anyone 44 and older to request care online for non-urgent symptoms. For details visit [email or web address].   Also download the MyChart app! Go to the app store, search "MyChart", open the app, select [facility], and log in with your MyChart username and password.

## 2023-01-01 ENCOUNTER — Ambulatory Visit (HOSPITAL_COMMUNITY)
Admission: RE | Admit: 2023-01-01 | Discharge: 2023-01-01 | Disposition: A | Discharge status: Home / Self Care | Payer: Medicare Other | Source: Ambulatory Visit | Attending: Gastroenterology | Admitting: Gastroenterology

## 2023-01-01 ENCOUNTER — Encounter (HOSPITAL_COMMUNITY): Payer: Self-pay

## 2023-01-01 DIAGNOSIS — R188 Other ascites: Secondary | ICD-10-CM | POA: Diagnosis not present

## 2023-01-01 MED ORDER — ALBUMIN HUMAN 25 % IV SOLN
25.0000 g | Freq: Once | INTRAVENOUS | Status: AC
  Administered 2023-01-01: 25 g via INTRAVENOUS

## 2023-01-01 MED ORDER — ALBUMIN HUMAN 25 % IV SOLN
INTRAVENOUS | Status: AC
  Filled 2023-01-01: qty 100

## 2023-01-01 NOTE — Progress Notes (Signed)
Patient tolerated left sided paracentesis and 25G of IV albumin well today and 4 Liters of clear yellow ascites removed. Patient verbalized understanding of discharge instructions and left via wheelchair with family with no acute distress noted.

## 2023-01-04 DIAGNOSIS — Z23 Encounter for immunization: Secondary | ICD-10-CM | POA: Diagnosis not present

## 2023-01-05 ENCOUNTER — Encounter: Payer: Self-pay | Admitting: Hematology

## 2023-01-07 ENCOUNTER — Inpatient Hospital Stay: Payer: Medicare Other

## 2023-01-07 VITALS — BP 108/58 | HR 61 | Temp 96.8°F | Resp 18

## 2023-01-07 DIAGNOSIS — Q211 Atrial septal defect, unspecified: Secondary | ICD-10-CM | POA: Diagnosis not present

## 2023-01-07 DIAGNOSIS — D500 Iron deficiency anemia secondary to blood loss (chronic): Secondary | ICD-10-CM

## 2023-01-07 DIAGNOSIS — E11319 Type 2 diabetes mellitus with unspecified diabetic retinopathy without macular edema: Secondary | ICD-10-CM | POA: Diagnosis not present

## 2023-01-07 DIAGNOSIS — D696 Thrombocytopenia, unspecified: Secondary | ICD-10-CM | POA: Diagnosis not present

## 2023-01-07 DIAGNOSIS — K7581 Nonalcoholic steatohepatitis (NASH): Secondary | ICD-10-CM | POA: Diagnosis not present

## 2023-01-07 DIAGNOSIS — D631 Anemia in chronic kidney disease: Secondary | ICD-10-CM | POA: Diagnosis not present

## 2023-01-07 DIAGNOSIS — K766 Portal hypertension: Secondary | ICD-10-CM | POA: Diagnosis not present

## 2023-01-07 DIAGNOSIS — E1165 Type 2 diabetes mellitus with hyperglycemia: Secondary | ICD-10-CM | POA: Diagnosis not present

## 2023-01-07 DIAGNOSIS — R188 Other ascites: Secondary | ICD-10-CM | POA: Diagnosis not present

## 2023-01-07 DIAGNOSIS — E785 Hyperlipidemia, unspecified: Secondary | ICD-10-CM | POA: Diagnosis not present

## 2023-01-07 DIAGNOSIS — I129 Hypertensive chronic kidney disease with stage 1 through stage 4 chronic kidney disease, or unspecified chronic kidney disease: Secondary | ICD-10-CM | POA: Diagnosis not present

## 2023-01-07 DIAGNOSIS — I8510 Secondary esophageal varices without bleeding: Secondary | ICD-10-CM | POA: Diagnosis not present

## 2023-01-07 DIAGNOSIS — K7460 Unspecified cirrhosis of liver: Secondary | ICD-10-CM | POA: Diagnosis not present

## 2023-01-07 DIAGNOSIS — E1122 Type 2 diabetes mellitus with diabetic chronic kidney disease: Secondary | ICD-10-CM | POA: Diagnosis not present

## 2023-01-07 DIAGNOSIS — N184 Chronic kidney disease, stage 4 (severe): Secondary | ICD-10-CM

## 2023-01-07 DIAGNOSIS — K7682 Hepatic encephalopathy: Secondary | ICD-10-CM | POA: Diagnosis not present

## 2023-01-07 DIAGNOSIS — D689 Coagulation defect, unspecified: Secondary | ICD-10-CM | POA: Diagnosis not present

## 2023-01-07 DIAGNOSIS — E860 Dehydration: Secondary | ICD-10-CM | POA: Diagnosis not present

## 2023-01-07 DIAGNOSIS — K652 Spontaneous bacterial peritonitis: Secondary | ICD-10-CM | POA: Diagnosis not present

## 2023-01-07 DIAGNOSIS — Z794 Long term (current) use of insulin: Secondary | ICD-10-CM | POA: Diagnosis not present

## 2023-01-07 DIAGNOSIS — E039 Hypothyroidism, unspecified: Secondary | ICD-10-CM | POA: Diagnosis not present

## 2023-01-07 DIAGNOSIS — Z66 Do not resuscitate: Secondary | ICD-10-CM | POA: Diagnosis not present

## 2023-01-07 DIAGNOSIS — N179 Acute kidney failure, unspecified: Secondary | ICD-10-CM | POA: Diagnosis not present

## 2023-01-07 LAB — CBC
HCT: 31.9 % — ABNORMAL LOW (ref 36.0–46.0)
Hemoglobin: 10.4 g/dL — ABNORMAL LOW (ref 12.0–15.0)
MCH: 33 pg (ref 26.0–34.0)
MCHC: 32.6 g/dL (ref 30.0–36.0)
MCV: 101.3 fL — ABNORMAL HIGH (ref 80.0–100.0)
Platelets: 60 10*3/uL — ABNORMAL LOW (ref 150–400)
RBC: 3.15 MIL/uL — ABNORMAL LOW (ref 3.87–5.11)
RDW: 15.7 % — ABNORMAL HIGH (ref 11.5–15.5)
WBC: 8.5 10*3/uL (ref 4.0–10.5)
nRBC: 0 % (ref 0.0–0.2)

## 2023-01-07 MED ORDER — EPOETIN ALFA-EPBX 10000 UNIT/ML IJ SOLN
10000.0000 [IU] | Freq: Once | INTRAMUSCULAR | Status: AC
  Administered 2023-01-07: 10000 [IU] via SUBCUTANEOUS
  Filled 2023-01-07: qty 1

## 2023-01-07 NOTE — Progress Notes (Signed)
Retacrit injection given per orders. Patient tolerated it well without problems. Vitals stable and discharged home from clinic via wheelchair. Follow up as scheduled.  

## 2023-01-07 NOTE — Patient Instructions (Addendum)
[facility]  Discharge Instructions: Thank you for choosing [facility] to provide your oncology and hematology care.  If you have a lab appointment with the Cancer Center - please note that after April 8th, 2024, all labs will be drawn in the cancer center.  You do not have to check in or register with the main entrance as you have in the past but will complete your check-in in the cancer center.  Wear comfortable clothing and clothing appropriate for easy access to any Portacath or PICC line.   We strive to give you quality time with your provider. You may need to reschedule your appointment if you arrive late (15 or more minutes).  Arriving late affects you and other patients whose appointments are after yours.  Also, if you miss three or more appointments without notifying the office, you may be dismissed from the clinic at the provider's discretion.      For prescription refill requests, have your pharmacy contact our office and allow 72 hours for refills to be completed.    Today you received the following retacrit injection    To help prevent nausea and vomiting after your treatment, we encourage you to take your nausea medication as directed.  BELOW ARE SYMPTOMS THAT SHOULD BE REPORTED IMMEDIATELY: *FEVER GREATER THAN 100.4 F (38 C) OR HIGHER *CHILLS OR SWEATING *NAUSEA AND VOMITING THAT IS NOT CONTROLLED WITH YOUR NAUSEA MEDICATION *UNUSUAL SHORTNESS OF BREATH *UNUSUAL BRUISING OR BLEEDING *URINARY PROBLEMS (pain or burning when urinating, or frequent urination) *BOWEL PROBLEMS (unusual diarrhea, constipation, pain near the anus) TENDERNESS IN MOUTH AND THROAT WITH OR WITHOUT PRESENCE OF ULCERS (sore throat, sores in mouth, or a toothache) UNUSUAL RASH, SWELLING OR PAIN  UNUSUAL VAGINAL DISCHARGE OR ITCHING   Items with * indicate a potential emergency and should be followed up as soon as possible or go to the Emergency Department if any  problems should occur.  Please show the CHEMOTHERAPY ALERT CARD or IMMUNOTHERAPY ALERT CARD at check-in to the Emergency Department and triage nurse.  Should you have questions after your visit or need to cancel or reschedule your appointment, please contact [facility] [phone]  and follow the prompts.  Office hours are 8:00 a.m. to 4:30 p.m. Monday - Friday. Please note that voicemails left after 4:00 p.m. may not be returned until the following business day.  We are closed weekends and major holidays. You have access to a nurse at all times for urgent questions. Please call the main number to the clinic [phone] and follow the prompts.  For any non-urgent questions, you may also contact your provider using MyChart. We now offer e-Visits for anyone 75 and older to request care online for non-urgent symptoms. For details visit [email or web address].   Also download the MyChart app! Go to the app store, search "MyChart", open the app, select [facility], and log in with your MyChart username and password.

## 2023-01-10 ENCOUNTER — Inpatient Hospital Stay (HOSPITAL_COMMUNITY)
Admission: EM | Admit: 2023-01-10 | Discharge: 2023-01-19 | DRG: 441 | Disposition: A | Discharge status: Skilled Nursing Facility | Payer: Medicare Other | Attending: Family Medicine | Admitting: Family Medicine

## 2023-01-10 ENCOUNTER — Emergency Department (HOSPITAL_COMMUNITY): Payer: Medicare Other

## 2023-01-10 ENCOUNTER — Other Ambulatory Visit: Payer: Self-pay

## 2023-01-10 ENCOUNTER — Encounter (HOSPITAL_COMMUNITY): Payer: Self-pay | Admitting: *Deleted

## 2023-01-10 DIAGNOSIS — I129 Hypertensive chronic kidney disease with stage 1 through stage 4 chronic kidney disease, or unspecified chronic kidney disease: Secondary | ICD-10-CM | POA: Diagnosis present

## 2023-01-10 DIAGNOSIS — D649 Anemia, unspecified: Secondary | ICD-10-CM | POA: Diagnosis not present

## 2023-01-10 DIAGNOSIS — I10 Essential (primary) hypertension: Secondary | ICD-10-CM | POA: Diagnosis not present

## 2023-01-10 DIAGNOSIS — E11319 Type 2 diabetes mellitus with unspecified diabetic retinopathy without macular edema: Secondary | ICD-10-CM | POA: Diagnosis present

## 2023-01-10 DIAGNOSIS — K652 Spontaneous bacterial peritonitis: Secondary | ICD-10-CM

## 2023-01-10 DIAGNOSIS — R112 Nausea with vomiting, unspecified: Secondary | ICD-10-CM | POA: Diagnosis present

## 2023-01-10 DIAGNOSIS — Q211 Atrial septal defect, unspecified: Secondary | ICD-10-CM

## 2023-01-10 DIAGNOSIS — E669 Obesity, unspecified: Secondary | ICD-10-CM | POA: Diagnosis present

## 2023-01-10 DIAGNOSIS — K7469 Other cirrhosis of liver: Secondary | ICD-10-CM | POA: Diagnosis present

## 2023-01-10 DIAGNOSIS — K766 Portal hypertension: Secondary | ICD-10-CM | POA: Diagnosis present

## 2023-01-10 DIAGNOSIS — R4182 Altered mental status, unspecified: Principal | ICD-10-CM

## 2023-01-10 DIAGNOSIS — Z66 Do not resuscitate: Secondary | ICD-10-CM | POA: Diagnosis present

## 2023-01-10 DIAGNOSIS — R197 Diarrhea, unspecified: Secondary | ICD-10-CM | POA: Diagnosis present

## 2023-01-10 DIAGNOSIS — N1831 Chronic kidney disease, stage 3a: Secondary | ICD-10-CM

## 2023-01-10 DIAGNOSIS — D631 Anemia in chronic kidney disease: Secondary | ICD-10-CM | POA: Diagnosis present

## 2023-01-10 DIAGNOSIS — K7581 Nonalcoholic steatohepatitis (NASH): Secondary | ICD-10-CM | POA: Diagnosis present

## 2023-01-10 DIAGNOSIS — G629 Polyneuropathy, unspecified: Secondary | ICD-10-CM | POA: Diagnosis present

## 2023-01-10 DIAGNOSIS — M109 Gout, unspecified: Secondary | ICD-10-CM | POA: Diagnosis present

## 2023-01-10 DIAGNOSIS — Z6830 Body mass index (BMI) 30.0-30.9, adult: Secondary | ICD-10-CM

## 2023-01-10 DIAGNOSIS — Z90710 Acquired absence of both cervix and uterus: Secondary | ICD-10-CM

## 2023-01-10 DIAGNOSIS — R2689 Other abnormalities of gait and mobility: Secondary | ICD-10-CM | POA: Diagnosis not present

## 2023-01-10 DIAGNOSIS — K219 Gastro-esophageal reflux disease without esophagitis: Secondary | ICD-10-CM | POA: Diagnosis present

## 2023-01-10 DIAGNOSIS — Z743 Need for continuous supervision: Secondary | ICD-10-CM | POA: Diagnosis not present

## 2023-01-10 DIAGNOSIS — Z862 Personal history of diseases of the blood and blood-forming organs and certain disorders involving the immune mechanism: Secondary | ICD-10-CM

## 2023-01-10 DIAGNOSIS — D696 Thrombocytopenia, unspecified: Secondary | ICD-10-CM | POA: Diagnosis present

## 2023-01-10 DIAGNOSIS — R739 Hyperglycemia, unspecified: Secondary | ICD-10-CM | POA: Diagnosis not present

## 2023-01-10 DIAGNOSIS — K3189 Other diseases of stomach and duodenum: Secondary | ICD-10-CM | POA: Diagnosis present

## 2023-01-10 DIAGNOSIS — Z833 Family history of diabetes mellitus: Secondary | ICD-10-CM

## 2023-01-10 DIAGNOSIS — Z90721 Acquired absence of ovaries, unilateral: Secondary | ICD-10-CM

## 2023-01-10 DIAGNOSIS — Z825 Family history of asthma and other chronic lower respiratory diseases: Secondary | ICD-10-CM

## 2023-01-10 DIAGNOSIS — I8510 Secondary esophageal varices without bleeding: Secondary | ICD-10-CM | POA: Diagnosis present

## 2023-01-10 DIAGNOSIS — K7460 Unspecified cirrhosis of liver: Secondary | ICD-10-CM | POA: Diagnosis not present

## 2023-01-10 DIAGNOSIS — R278 Other lack of coordination: Secondary | ICD-10-CM | POA: Diagnosis not present

## 2023-01-10 DIAGNOSIS — Z794 Long term (current) use of insulin: Secondary | ICD-10-CM | POA: Diagnosis not present

## 2023-01-10 DIAGNOSIS — F32A Depression, unspecified: Secondary | ICD-10-CM | POA: Diagnosis present

## 2023-01-10 DIAGNOSIS — N179 Acute kidney failure, unspecified: Secondary | ICD-10-CM

## 2023-01-10 DIAGNOSIS — E785 Hyperlipidemia, unspecified: Secondary | ICD-10-CM | POA: Diagnosis present

## 2023-01-10 DIAGNOSIS — M8580 Other specified disorders of bone density and structure, unspecified site: Secondary | ICD-10-CM | POA: Diagnosis present

## 2023-01-10 DIAGNOSIS — K7682 Hepatic encephalopathy: Secondary | ICD-10-CM | POA: Diagnosis not present

## 2023-01-10 DIAGNOSIS — N184 Chronic kidney disease, stage 4 (severe): Secondary | ICD-10-CM

## 2023-01-10 DIAGNOSIS — E66811 Obesity, class 1: Secondary | ICD-10-CM | POA: Diagnosis present

## 2023-01-10 DIAGNOSIS — M6281 Muscle weakness (generalized): Secondary | ICD-10-CM | POA: Diagnosis not present

## 2023-01-10 DIAGNOSIS — E039 Hypothyroidism, unspecified: Secondary | ICD-10-CM | POA: Diagnosis present

## 2023-01-10 DIAGNOSIS — E860 Dehydration: Secondary | ICD-10-CM | POA: Diagnosis present

## 2023-01-10 DIAGNOSIS — R188 Other ascites: Secondary | ICD-10-CM | POA: Diagnosis not present

## 2023-01-10 DIAGNOSIS — Z79890 Hormone replacement therapy (postmenopausal): Secondary | ICD-10-CM

## 2023-01-10 DIAGNOSIS — D689 Coagulation defect, unspecified: Secondary | ICD-10-CM | POA: Diagnosis present

## 2023-01-10 DIAGNOSIS — Z5986 Financial insecurity: Secondary | ICD-10-CM

## 2023-01-10 DIAGNOSIS — E1122 Type 2 diabetes mellitus with diabetic chronic kidney disease: Secondary | ICD-10-CM

## 2023-01-10 DIAGNOSIS — Z881 Allergy status to other antibiotic agents status: Secondary | ICD-10-CM

## 2023-01-10 DIAGNOSIS — Z981 Arthrodesis status: Secondary | ICD-10-CM

## 2023-01-10 DIAGNOSIS — R410 Disorientation, unspecified: Secondary | ICD-10-CM | POA: Diagnosis not present

## 2023-01-10 DIAGNOSIS — D509 Iron deficiency anemia, unspecified: Secondary | ICD-10-CM | POA: Diagnosis present

## 2023-01-10 DIAGNOSIS — Z888 Allergy status to other drugs, medicaments and biological substances status: Secondary | ICD-10-CM

## 2023-01-10 DIAGNOSIS — Z885 Allergy status to narcotic agent status: Secondary | ICD-10-CM

## 2023-01-10 DIAGNOSIS — E1165 Type 2 diabetes mellitus with hyperglycemia: Secondary | ICD-10-CM | POA: Diagnosis present

## 2023-01-10 DIAGNOSIS — Z882 Allergy status to sulfonamides status: Secondary | ICD-10-CM

## 2023-01-10 DIAGNOSIS — R680 Hypothermia, not associated with low environmental temperature: Secondary | ICD-10-CM | POA: Diagnosis present

## 2023-01-10 DIAGNOSIS — Z801 Family history of malignant neoplasm of trachea, bronchus and lung: Secondary | ICD-10-CM

## 2023-01-10 DIAGNOSIS — L8990 Pressure ulcer of unspecified site, unspecified stage: Secondary | ICD-10-CM | POA: Insufficient documentation

## 2023-01-10 DIAGNOSIS — T502X5A Adverse effect of carbonic-anhydrase inhibitors, benzothiadiazides and other diuretics, initial encounter: Secondary | ICD-10-CM | POA: Diagnosis present

## 2023-01-10 DIAGNOSIS — Z79899 Other long term (current) drug therapy: Secondary | ICD-10-CM

## 2023-01-10 LAB — AMMONIA: Ammonia: 130 umol/L — ABNORMAL HIGH (ref 9–35)

## 2023-01-10 LAB — COMPREHENSIVE METABOLIC PANEL
ALT: 22 U/L (ref 0–44)
AST: 36 U/L (ref 15–41)
Albumin: 2.7 g/dL — ABNORMAL LOW (ref 3.5–5.0)
Alkaline Phosphatase: 137 U/L — ABNORMAL HIGH (ref 38–126)
Anion gap: 10 (ref 5–15)
BUN: 72 mg/dL — ABNORMAL HIGH (ref 8–23)
CO2: 22 mmol/L (ref 22–32)
Calcium: 8.1 mg/dL — ABNORMAL LOW (ref 8.9–10.3)
Chloride: 103 mmol/L (ref 98–111)
Creatinine, Ser: 2.67 mg/dL — ABNORMAL HIGH (ref 0.44–1.00)
GFR, Estimated: 19 mL/min — ABNORMAL LOW (ref >60)
Glucose, Bld: 277 mg/dL — ABNORMAL HIGH (ref 70–99)
Potassium: 3.2 mmol/L — ABNORMAL LOW (ref 3.5–5.1)
Sodium: 135 mmol/L (ref 135–145)
Total Bilirubin: 2 mg/dL — ABNORMAL HIGH (ref 0.3–1.2)
Total Protein: 6 g/dL — ABNORMAL LOW (ref 6.5–8.1)

## 2023-01-10 LAB — URINALYSIS, ROUTINE W REFLEX MICROSCOPIC
Bilirubin Urine: NEGATIVE
Glucose, UA: NEGATIVE mg/dL
Ketones, ur: NEGATIVE mg/dL
Leukocytes,Ua: NEGATIVE
Nitrite: NEGATIVE
Protein, ur: NEGATIVE mg/dL
Specific Gravity, Urine: 1.013 (ref 1.005–1.030)
pH: 6 (ref 5.0–8.0)

## 2023-01-10 LAB — CBC WITH DIFFERENTIAL/PLATELET
Abs Immature Granulocytes: 0.1 10*3/uL — ABNORMAL HIGH (ref 0.00–0.07)
Basophils Absolute: 0 10*3/uL (ref 0.0–0.1)
Basophils Relative: 0 %
Eosinophils Absolute: 0 10*3/uL (ref 0.0–0.5)
Eosinophils Relative: 0 %
HCT: 35.8 % — ABNORMAL LOW (ref 36.0–46.0)
Hemoglobin: 11.4 g/dL — ABNORMAL LOW (ref 12.0–15.0)
Lymphocytes Relative: 7 %
Lymphs Abs: 0.5 10*3/uL — ABNORMAL LOW (ref 0.7–4.0)
MCH: 32.1 pg (ref 26.0–34.0)
MCHC: 31.8 g/dL (ref 30.0–36.0)
MCV: 100.8 fL — ABNORMAL HIGH (ref 80.0–100.0)
Metamyelocytes Relative: 1 %
Monocytes Absolute: 0 10*3/uL — ABNORMAL LOW (ref 0.1–1.0)
Monocytes Relative: 0 %
Neutro Abs: 6.4 10*3/uL (ref 1.7–7.7)
Neutrophils Relative %: 92 %
Platelets: 58 10*3/uL — ABNORMAL LOW (ref 150–400)
RBC: 3.55 MIL/uL — ABNORMAL LOW (ref 3.87–5.11)
RDW: 15.9 % — ABNORMAL HIGH (ref 11.5–15.5)
WBC: 7 10*3/uL (ref 4.0–10.5)
nRBC: 0 % (ref 0.0–0.2)

## 2023-01-10 LAB — TROPONIN I (HIGH SENSITIVITY)
Troponin I (High Sensitivity): 9 ng/L (ref <18)
Troponin I (High Sensitivity): 9 ng/L (ref <18)

## 2023-01-10 LAB — GLUCOSE, CAPILLARY: Glucose-Capillary: 230 mg/dL — ABNORMAL HIGH (ref 70–99)

## 2023-01-10 LAB — BLOOD GAS, VENOUS
Acid-base deficit: 1.2 mmol/L (ref 0.0–2.0)
Bicarbonate: 23.6 mmol/L (ref 20.0–28.0)
Drawn by: 65579
O2 Saturation: 35.7 %
Patient temperature: 34.6
pCO2, Ven: 35 mm[Hg] — ABNORMAL LOW (ref 44–60)
pH, Ven: 7.42 (ref 7.25–7.43)
pO2, Ven: <31 mm[Hg] — CL (ref 32–45)

## 2023-01-10 MED ORDER — PROMETHAZINE HCL 12.5 MG PO TABS: 12.5000 mg | ORAL_TABLET | Freq: Four times a day (QID) | ORAL | Status: DC | PRN

## 2023-01-10 MED ORDER — POTASSIUM CHLORIDE IN NACL 20-0.9 MEQ/L-% IV SOLN: INTRAVENOUS | Status: AC

## 2023-01-10 MED ORDER — LACTULOSE ENEMA
300.0000 mL | Freq: Two times a day (BID) | ORAL | Status: DC
  Administered 2023-01-11 (×3): 300 mL via RECTAL
  Filled 2023-01-10 (×6): qty 300

## 2023-01-10 MED ORDER — LACTULOSE ENEMA: 300.0000 mL | Freq: Once | ORAL | Status: DC

## 2023-01-10 MED ORDER — LEVOTHYROXINE SODIUM 25 MCG PO TABS
125.0000 ug | ORAL_TABLET | Freq: Every day | ORAL | Status: DC
  Administered 2023-01-12 – 2023-01-19 (×8): 125 ug via ORAL
  Filled 2023-01-10 (×8): qty 1

## 2023-01-10 MED ORDER — POTASSIUM CHLORIDE IN NACL 20-0.9 MEQ/L-% IV SOLN: INTRAVENOUS | Status: DC

## 2023-01-10 MED ORDER — ACETAMINOPHEN 650 MG RE SUPP: 650.0000 mg | Freq: Four times a day (QID) | RECTAL | Status: DC | PRN

## 2023-01-10 MED ORDER — INSULIN ASPART 100 UNIT/ML IJ SOLN
0.0000 [IU] | INTRAMUSCULAR | Status: DC
  Administered 2023-01-10 – 2023-01-11 (×2): 5 [IU] via SUBCUTANEOUS
  Administered 2023-01-11: 2 [IU] via SUBCUTANEOUS
  Administered 2023-01-11: 3 [IU] via SUBCUTANEOUS
  Administered 2023-01-11: 5 [IU] via SUBCUTANEOUS
  Administered 2023-01-11: 3 [IU] via SUBCUTANEOUS
  Administered 2023-01-11: 2 [IU] via SUBCUTANEOUS
  Administered 2023-01-12 (×2): 3 [IU] via SUBCUTANEOUS
  Administered 2023-01-13 (×2): 5 [IU] via SUBCUTANEOUS
  Administered 2023-01-13 (×2): 3 [IU] via SUBCUTANEOUS
  Administered 2023-01-13: 5 [IU] via SUBCUTANEOUS
  Administered 2023-01-14: 2 [IU] via SUBCUTANEOUS
  Administered 2023-01-14: 3 [IU] via SUBCUTANEOUS
  Administered 2023-01-14: 2 [IU] via SUBCUTANEOUS
  Administered 2023-01-14: 5 [IU] via SUBCUTANEOUS
  Administered 2023-01-14: 8 [IU] via SUBCUTANEOUS
  Administered 2023-01-14: 2 [IU] via SUBCUTANEOUS
  Administered 2023-01-15: 8 [IU] via SUBCUTANEOUS
  Administered 2023-01-15: 2 [IU] via SUBCUTANEOUS
  Administered 2023-01-15: 5 [IU] via SUBCUTANEOUS
  Administered 2023-01-15 – 2023-01-16 (×2): 2 [IU] via SUBCUTANEOUS
  Administered 2023-01-16: 5 [IU] via SUBCUTANEOUS
  Administered 2023-01-16 (×3): 3 [IU] via SUBCUTANEOUS
  Administered 2023-01-17: 5 [IU] via SUBCUTANEOUS
  Administered 2023-01-17: 3 [IU] via SUBCUTANEOUS
  Administered 2023-01-17: 2 [IU] via SUBCUTANEOUS
  Administered 2023-01-17: 5 [IU] via SUBCUTANEOUS
  Administered 2023-01-17: 2 [IU] via SUBCUTANEOUS

## 2023-01-10 MED ORDER — LACTULOSE 10 GM/15ML PO SOLN: 20.0000 g | Freq: Three times a day (TID) | ORAL | Status: DC

## 2023-01-10 MED ORDER — SODIUM BICARBONATE 650 MG PO TABS
1300.0000 mg | ORAL_TABLET | Freq: Two times a day (BID) | ORAL | Status: DC
  Administered 2023-01-12 – 2023-01-19 (×15): 1300 mg via ORAL
  Filled 2023-01-10 (×17): qty 2

## 2023-01-10 MED ORDER — LACTULOSE 10 GM/15ML PO SOLN
30.0000 g | Freq: Once | ORAL | Status: AC
  Administered 2023-01-10: 30 g via ORAL
  Filled 2023-01-10: qty 60

## 2023-01-10 MED ORDER — LACTULOSE 10 GM/15ML PO SOLN
30.0000 g | Freq: Once | ORAL | Status: DC
  Filled 2023-01-10: qty 60

## 2023-01-10 MED ORDER — NADOLOL 40 MG PO TABS
40.0000 mg | ORAL_TABLET | Freq: Every day | ORAL | Status: DC
  Filled 2023-01-10 (×3): qty 1

## 2023-01-10 MED ORDER — POTASSIUM CHLORIDE 20 MEQ PO PACK
40.0000 meq | PACK | Freq: Once | ORAL | Status: DC
  Filled 2023-01-10: qty 2

## 2023-01-10 MED ORDER — SODIUM CHLORIDE 0.9 % IV SOLN
2.0000 g | INTRAVENOUS | Status: AC
  Administered 2023-01-11 – 2023-01-14 (×5): 2 g via INTRAVENOUS
  Filled 2023-01-10 (×5): qty 20

## 2023-01-10 MED ORDER — ACETAMINOPHEN 325 MG PO TABS
650.0000 mg | ORAL_TABLET | Freq: Four times a day (QID) | ORAL | Status: DC | PRN
  Filled 2023-01-10: qty 2

## 2023-01-10 NOTE — Assessment & Plan Note (Addendum)
Stable.  Nadolol and propranolol on home med list.

## 2023-01-10 NOTE — Assessment & Plan Note (Signed)
Cr 2.67, recent baseline 1.8-2.  Likely prerenal- multiple episodes of vomiting overnight, poor oral intake.  Also need to come consider hepatorenal considering- bilateral lower extremity edema and ascites. No significant urine output thus far,.  Prior history of urinary retention. -Bladder scan - N/s + 20 KCL 100cc/hr x 15hrs -Hold home torsemide for now

## 2023-01-10 NOTE — ED Triage Notes (Signed)
BIB [facility] from home for AMS, and NV. Yellow bile like emesis on gown. Sx ongoing for ~ 2d. Scheduled for paracentesis tomorrow at [facility], h/o cirrhosis. Abd and BLE edema noted, L>R. NSL 20g L FA. Alert, NAD, calm, confused, does not follow commands, and answers "[name]" to many questions. VSS. BS 291.

## 2023-01-10 NOTE — H&P (Signed)
History and Physical    [name] OZH:[id number] DOB: 29-Dec-1950 DOA: 01/10/2023  PCP: Benita Stabile, MD   Patient coming from: Home  I have personally briefly reviewed patient's old medical records in [facility]  Chief Complaint: AMS  HPI: [name] is a 72 y.o. female with medical history significant for liver cirrhosis, diabetes mellitus, hypertension, CKD. Patient was brought to the ED with reports of confusion, progressive abdominal and leg swelling, and vomiting, slow to respond.  Family is at bedside and provides the history.  At the time of my evaluation patient is awake, lethargic, not answering questions.  Family reports overnight patient had multiple episodes of vomiting, and has barely had any oral intake for the past couple of days.  No diarrhea.  She has never been on lactulose or rifaximin.  She is due for paracentesis tomorrow, she gets this every 9 to 10 days.  ED Course: Temperature down to 94.3, improved with warming blanket.  Heart rate 69 to 90s.  Respirate rate 18-24.  Blood pressure systolic 102-130.  O2 sats greater than 95% on room air.  Ammonia 130.  WBC 7.  VBG shows pH of 7.4, pCO2 of 35. Troponin 9 x 2. UA not suggestive of UTI. Head CT negative for acute abnormality. Lactulose 30 g given in ED.  Hospitalist admit for acute hepatic encephalopathy.  Review of Systems: Limited Due to altered mental status..  Past Medical History:  Diagnosis Date   Alkaline phosphatase elevation    Anemia    Anemia in stage 4 chronic kidney disease (HCC) 10/15/2022   ASD (atrial septal defect)    Cataract    both eyes hx of   Chest pain    03-17-2013 last chest pain   Chronic kidney disease    Stage III kidney disease   Cirrhosis (HCC)    Depression    Diabetes mellitus type II    Family history of adverse reaction to anesthesia    Son hard to wake up   Fatigue    GERD (gastroesophageal reflux disease)    Gout    Headache(784.0)    occasional    Hyperlipidemia    Hypertension    Hypothyroidism    Neuropathy    Osteopenia    Peripheral neuropathy    Presence of pessary    Retinopathy     Past Surgical History:  Procedure Laterality Date   APPENDECTOMY     CATARACT EXTRACTION     CERVICAL SPINE SURGERY  2006   CHOLECYSTECTOMY N/A 04/07/2013   Procedure: LAPAROSCOPIC CHOLECYSTECTOMY WITH INTRAOPERATIVE CHOLANGIOGRAM;  Surgeon: Robyne Askew, MD;  Location: [facility];  Service: General;  Laterality: N/A;   COMBINED HYSTERECTOMY VAGINAL / OOPHORECTOMY / A&P REPAIR  1987   Unilateral oophorectomy, h/o uterine prolapse has right ovary   ERCP N/A 04/06/2013   Procedure: ENDOSCOPIC RETROGRADE CHOLANGIOPANCREATOGRAPHY (ERCP);  Surgeon: Theda Belfast, MD;  Location: [facility];  Service: Endoscopy;  Laterality: N/A;   ESOPHAGEAL BANDING N/A 09/26/2021   Procedure: ESOPHAGEAL BANDING;  Surgeon: Jeani Hawking, MD;  Location: [facility];  Service: Gastroenterology;  Laterality: N/A;   ESOPHAGEAL BANDING N/A 10/31/2021   Procedure: ESOPHAGEAL BANDING;  Surgeon: Jeani Hawking, MD;  Location: [facility];  Service: Gastroenterology;  Laterality: N/A;   ESOPHAGOGASTRODUODENOSCOPY (EGD) WITH PROPOFOL N/A 08/16/2020   Procedure: ESOPHAGOGASTRODUODENOSCOPY (EGD) WITH PROPOFOL;  Surgeon: Jeani Hawking, MD;  Location: [facility];  Service: Endoscopy;  Laterality: N/A;   ESOPHAGOGASTRODUODENOSCOPY (EGD) WITH  PROPOFOL N/A 09/26/2021   Procedure: ESOPHAGOGASTRODUODENOSCOPY (EGD) WITH PROPOFOL;  Surgeon: Jeani Hawking, MD;  Location: [facility];  Service: Gastroenterology;  Laterality: N/A;   ESOPHAGOGASTRODUODENOSCOPY (EGD) WITH PROPOFOL N/A 10/31/2021   Procedure: ESOPHAGOGASTRODUODENOSCOPY (EGD) WITH PROPOFOL;  Surgeon: Jeani Hawking, MD;  Location: [facility];  Service: Gastroenterology;  Laterality: N/A;   EUS N/A 03/31/2013   Procedure: UPPER ENDOSCOPIC ULTRASOUND (EUS) LINEAR;  Surgeon: Theda Belfast, MD;  Location: [facility];   Service: Endoscopy;  Laterality: N/A;   REFRACTIVE SURGERY     SPINAL FUSION     c4-c7   TONSILLECTOMY  age 65     reports that she has never smoked. She has never used smokeless tobacco. She reports that she does not drink alcohol and does not use drugs.  Allergies  Allergen Reactions   Bactrim [Sulfamethoxazole-Trimethoprim] Nausea And Vomiting and Other (See Comments)    Dizziness   Cipro [Ciprofloxacin Hcl] Other (See Comments)    Severe yeast infection   Codeine Other (See Comments)    Unknown reaction   Cozaar [Losartan Potassium] Cough   Lipitor [Atorvastatin] Other (See Comments)    Arthralgias Myalgias   Mevacor [Lovastatin] Other (See Comments)    Arthralgias  Myalgias   Pravachol [Pravastatin] Other (See Comments)    Arthralgias  Myalgias    Rosuvastatin Diarrhea and Other (See Comments)    Arthralgias Myalgias   Statins Other (See Comments)    Arthralgias Myalgias   Welchol [Colesevelam] Other (See Comments)    Arthralgias Myalgias    Zestril [Lisinopril] Cough   Zetia [Ezetimibe] Other (See Comments)    Arthralgias Myalgias    Dilaudid [Hydromorphone] Nausea And Vomiting and Other (See Comments)    GI Intolerance     Family History  Problem Relation Age of Onset   COPD Mother    Lung cancer Father    Diabetes Sister    Cataracts Sister    Insulin resistance Daughter    Insulin resistance Son     Prior to Admission medications   Medication Sig Start Date End Date Taking? Authorizing Provider  acetaminophen (TYLENOL) 325 MG tablet Take 2 tablets (650 mg total) by mouth every 6 (six) hours as needed for mild pain (or Fever >/= 101). 09/10/22  Yes Herald Vallin, Courage, MD  allopurinol (ZYLOPRIM) 100 MG tablet Take 100 mg by mouth every evening.    Yes [provider]  benzonatate (TESSALON) 200 MG capsule Take 200 mg by mouth 3 (three) times daily as needed for cough.   Yes [provider]  insulin NPH Human (NOVOLIN N RELION) 100  UNIT/ML injection Inject 16 Units into the skin in the morning and at bedtime.   Yes [provider]  insulin regular (NOVOLIN R RELION) 100 units/mL injection Inject 5 Units into the skin 3 (three) times daily before meals.   Yes [provider]  levothyroxine (SYNTHROID) 125 MCG tablet Take 125 mcg by mouth daily before breakfast. 09/03/22  Yes [provider]  nadolol (CORGARD) 40 MG tablet Take 40 mg by mouth daily.   Yes [provider]  nystatin (MYCOSTATIN/NYSTOP) powder Apply 1 Application topically 3 (three) times daily. 09/29/22  Yes [provider]  ondansetron (ZOFRAN-ODT) 4 MG disintegrating tablet Take 4 mg by mouth every 8 (eight) hours as needed. 09/24/22  Yes [provider]  propranolol (INDERAL) 40 MG tablet Take 0.5 tablets (20 mg total) by mouth daily. 09/10/22  Yes Latise Dilley, Courage, MD  sodium bicarbonate 650 MG tablet  Take 2 tablets (1,300 mg total) by mouth 2 (two) times daily. 09/10/22 09/10/23 Yes Khalaya Mcgurn, Courage, MD  torsemide (DEMADEX) 20 MG tablet Take 20 mg by mouth every other day.   Yes [provider]  Vitamin D, Ergocalciferol, (DRISDOL) 1.25 MG (50000 UT) CAPS capsule Take 50,000 Units by mouth every Wednesday. 11/16/18  Yes [provider]    Physical Exam: Limited exam due to altered mental status Vitals:   01/10/23 1244 01/10/23 1300 01/10/23 1316 01/10/23 1600  BP:  (!) 104/53  130/62  Pulse:  69  85  Resp:  18  (!) 24  Temp:   (!) 94.3 F (34.6 C)   TempSrc:   Rectal   SpO2:  97%  95%  Weight: 96.2 kg       Constitutional: Lethargic, calm, comfortable Vitals:   01/10/23 1244 01/10/23 1300 01/10/23 1316 01/10/23 1600  BP:  (!) 104/53  130/62  Pulse:  69  85  Resp:  18  (!) 24  Temp:   (!) 94.3 F (34.6 C)   TempSrc:   Rectal   SpO2:  97%  95%  Weight: 96.2 kg      Eyes: PERRL, lids and conjunctivae normal ENMT: Mucous membranes are dry Neck: normal, supple, no masses, no  thyromegaly Respiratory: clear to auscultation bilaterally, no wheezing, no crackles. Normal respiratory effort. No accessory muscle use.  Cardiovascular: Regular rate and rhythm, no murmurs / rubs / gallops.  1+ pitting bilateral lower extremity edema to knees.  Extremities warm Abdomen: Distended, diffusely  tender no masses palpated. No hepatosplenomegaly.  Musculoskeletal: no clubbing / cyanosis. No joint deformity upper and lower extremities.  Skin: no rashes, lesions, ulcers. No induration Neurologic: Limited exam due to altered mental status, no facial asymmetry, moving all extremities spontaneously Psychiatric: Awake but lethargic   Labs on Admission: I have personally reviewed following labs and imaging studies  CBC: Recent Labs  Lab 01/07/23 1305 01/10/23 1309  WBC 8.5 7.0  NEUTROABS  --  6.4  HGB 10.4* 11.4*  HCT 31.9* 35.8*  MCV 101.3* 100.8*  PLT 60* 58*   Basic Metabolic Panel: Recent Labs  Lab 01/10/23 1309  NA 135  K 3.2*  CL 103  CO2 22  GLUCOSE 277*  BUN 72*  CREATININE 2.67*  CALCIUM 8.1*   GFR: Estimated Creatinine Clearance: 22.6 mL/min (A) (by C-G formula based on SCr of 2.67 mg/dL (H)). Liver Function Tests: Recent Labs  Lab 01/10/23 1309  AST 36  ALT 22  ALKPHOS 137*  BILITOT 2.0*  PROT 6.0*  ALBUMIN 2.7*   No results for input(s): "LIPASE", "AMYLASE" in the last 168 hours. Recent Labs  Lab 01/10/23 1309  AMMONIA 130*  Urine analysis:    Component Value Date/Time   COLORURINE YELLOW 01/10/2023 1453   APPEARANCEUR CLEAR 01/10/2023 1453   APPEARANCEUR Clear 09/15/2022 1144   LABSPEC 1.013 01/10/2023 1453   PHURINE 6.0 01/10/2023 1453   GLUCOSEU NEGATIVE 01/10/2023 1453   HGBUR SMALL (A) 01/10/2023 1453   BILIRUBINUR NEGATIVE 01/10/2023 1453   BILIRUBINUR Negative 09/15/2022 1144   KETONESUR NEGATIVE 01/10/2023 1453   PROTEINUR NEGATIVE 01/10/2023 1453   UROBILINOGEN 0.2 07/19/2014 0510   NITRITE NEGATIVE 01/10/2023 1453    LEUKOCYTESUR NEGATIVE 01/10/2023 1453    Radiological Exams on Admission: CT Head Wo Contrast  Result Date: 01/10/2023 CLINICAL DATA:  Delirium EXAM: CT HEAD WITHOUT CONTRAST TECHNIQUE: Contiguous axial images were obtained from the base of the skull through the vertex without  intravenous contrast. RADIATION DOSE REDUCTION: This exam was performed according to the departmental dose-optimization program which includes automated exposure control, adjustment of the mA and/or kV according to patient size and/or use of iterative reconstruction technique. COMPARISON:  CT head 08/25/22 FINDINGS: Brain: No hemorrhage. No hydrocephalus. No extra-axial fluid collection. No CT evidence of an acute cortical infarct. No mass effect. No mass lesion. Vascular: No hyperdense vessel or unexpected calcification. Skull: Normal. Negative for fracture or focal lesion. Sinuses/Orbits: No middle ear or mastoid effusion. Paranasal sinuses are clear. Right lens replacement. Orbits are otherwise unremarkable. Other: None. IMPRESSION: No acute intracranial abnormality. Electronically Signed   By: Lorenza Cambridge M.D.   On: 01/10/2023 16:06    EKG: None.  Assessment/Plan Principal Problem:   Acute hepatic encephalopathy (HCC) Active Problems:   Acute kidney injury superimposed on stage 4 chronic kidney disease (HCC)   Ascites   Liver cirrhosis secondary to NASH (HCC)   History of thrombocytopenia   Type 2 diabetes mellitus with diabetic chronic kidney disease (HCC)   Hypertension with renal disease   Gout   Assessment and Plan: * Acute hepatic encephalopathy (HCC) No prior history of hepatic encephalopathy.  Ammonia level elevated at 130.  Head CT negative for acute abnormality.  She also appears dehydrated.  Need to rule out SBP. -30 g lactulose given in ED, patient refused subsequent dose, will switch to lactulose enema, continue 3 times daily for now switch to oral when able -Follow-up ascites fluid analysis -IV  ceftriaxone -Hydrate  Ascites Abdomen markedly distended, tender palpation.  Afebrile.  WBC 7.  Tenderness may be secondary to distention versus SBP. -IV ceftriaxone 2 g daily for now - US guided paracentesis with fluid analysis in a.m.  Acute kidney injury superimposed on stage 4 chronic kidney disease (HCC) Cr 2.67, recent baseline 1.8-2.  Likely prerenal- multiple episodes of vomiting overnight, poor oral intake.  Also need to come consider hepatorenal considering- bilateral lower extremity edema and ascites. No significant urine output thus far,.  Prior history of urinary retention. -Bladder scan - N/s + 20 KCL 100cc/hr x 15hrs -Hold home torsemide for now  History of thrombocytopenia Platelets 58, at baseline.  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  Nadolol and propranolol both on home med list. -Resume nadolol.  Hypertension with renal disease Stable.  Nadolol and propranolol on home med list.  Type 2 diabetes mellitus with diabetic chronic kidney disease (HCC) Diabetes mellitus with hyperglycemia. - SSI- M - HgbA1c -Hold home NPH 12 units, and Novolin   DVT prophylaxis: SCDS Code Status: DNR-confirmed with patient, and family at bedside. Family Communication: Family at bedside, patient's son present- [name] is HCPOA.  Daughter [name], son Loistine Chance and grandson also present. Disposition Plan: ~ 2 days Consults called: None Admission status: Inpt Tele I certify that at the point of admission it is my clinical judgment that the patient will require inpatient hospital care spanning beyond 2 midnights from the point of admission due to high intensity of service, high risk for further deterioration and high frequency of surveillance required.  Author: Onnie Boer, MD 01/10/2023 9:45 PM  For on  call review [email or web address].

## 2023-01-10 NOTE — Assessment & Plan Note (Deleted)
Resume allopurinol

## 2023-01-10 NOTE — Assessment & Plan Note (Signed)
Diabetes mellitus with hyperglycemia. - SSI- M - HgbA1c -Hold home NPH 12 units, and Novolin

## 2023-01-10 NOTE — ED Provider Notes (Signed)
[place] EMERGENCY DEPARTMENT AT [facility] Provider Note  CSN: [id number] Arrival date & time: 01/10/23 1230  Chief Complaint(s) Altered Mental Status  HPI [name] is a 72 y.o. female with PMH NASH cirrhosis, T2DM, HTN, hypothyroidism, CKD 3 who presents emergency room for evaluation of altered mental status.  Lives with family who states that she has been progressively more confused over the last 48 hours.  Scheduled for paracentesis tomorrow.  Currently somnolent and not answering questions appropriately.  Additional history unable to be obtained secondary to patient's altered mental status.   Past Medical History Past Medical History:  Diagnosis Date   Alkaline phosphatase elevation    Anemia    Anemia in stage 4 chronic kidney disease (HCC) 10/15/2022   ASD (atrial septal defect)    Cataract    both eyes hx of   Chest pain    03-17-2013 last chest pain   Chronic kidney disease    Stage III kidney disease   Cirrhosis (HCC)    Depression    Diabetes mellitus type II    Family history of adverse reaction to anesthesia    Son hard to wake up   Fatigue    GERD (gastroesophageal reflux disease)    Gout    Headache(784.0)    occasional   Hyperlipidemia    Hypertension    Hypothyroidism    Neuropathy    Osteopenia    Peripheral neuropathy    Presence of pessary    Retinopathy    Patient Active Problem List   Diagnosis Date Noted   Anemia in stage 4 chronic kidney disease (HCC) 10/15/2022   Complete prolapse of vaginal vault 09/15/2022   Urinary retention 09/15/2022   Vaginal pessary present 09/15/2022   Elevated brain natriuretic peptide (BNP) level 09/03/2022   Hypoalbuminemia due to protein-calorie malnutrition (HCC) 09/03/2022   Prolonged QT interval 08/25/2022   Vaginal prolapse 08/25/2022   Acute renal failure superimposed on stage 3b chronic kidney disease (HCC) 08/25/2022   Obesity 06/16/2022   Cirrhosis, non-alcoholic (HCC) 12/04/2020    Vitamin D deficiency 11/26/2020   Mixed hyperlipidemia 11/26/2020   Gout 11/26/2020   History of thrombocytopenia 11/26/2020   Thrombocytopenia (HCC) 07/30/2017   CKD stage 3 due to type 2 diabetes mellitus (HCC) 06/30/2017   Liver cirrhosis secondary to NASH (HCC) 06/30/2017   Hypertension with renal disease 03/19/2017   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/19/2017   Tremor 08/06/2016   Vitreous hemorrhage of left eye (HCC) 01/29/2015   Diabetic macular edema (HCC) 12/25/2014   Hypertensive retinopathy of both eyes 12/25/2014   Proliferative diabetic retinopathy (HCC) 08/04/2011   Bilateral nondiabetic proliferative retinopathy 04/03/2011   Type 2 diabetes mellitus with diabetic chronic kidney disease (HCC) 02/05/2010   Acquired hypothyroidism 03/20/2008   RENAL DISEASE, CHRONIC, STAGE II 03/20/2008   Hereditary and idiopathic peripheral neuropathy 02/21/2008   Pseudophakia of both eyes 02/21/2008   GERD 02/21/2008   Home Medication(s) Prior to Admission medications   Medication Sig Start Date End Date Taking? Authorizing Provider  acetaminophen (TYLENOL) 325 MG tablet Take 2 tablets (650 mg total) by mouth every 6 (six) hours as needed for mild pain (or Fever >/= 101). 09/10/22   Shon Hale, MD  allopurinol (ZYLOPRIM) 100 MG tablet Take 100 mg by mouth every evening.     [provider]  insulin regular (NOVOLIN R) 100 units/mL injection Inject 5-8 Units into the skin 3 (three) times daily before meals. Sliding  scale 0-250=0 251-300=5 301-350=6 351-400=7units 401-450 8 units    [provider]  levothyroxine (SYNTHROID) 125 MCG tablet 1 tablet in the morning on an empty stomach Orally Once a day for 30 day(s) 09/03/22   [provider]  NOVOLIN N RELION 100 UNIT/ML injection Inject 14 Units into the skin 2 (two) times daily. 09/14/19   [provider]  nystatin (MYCOSTATIN/NYSTOP) powder Apply 1 Application topically 3 (three)  times daily. 09/29/22   [provider]  ondansetron (ZOFRAN-ODT) 4 MG disintegrating tablet Take 4 mg by mouth every 8 (eight) hours as needed. 09/24/22   [provider]  [facility] ULTRA test strip  12/19/18   [provider]  propranolol (INDERAL) 40 MG tablet Take 0.5 tablets (20 mg total) by mouth daily. 09/10/22   Shon Hale, MD  sodium bicarbonate 650 MG tablet Take 2 tablets (1,300 mg total) by mouth 2 (two) times daily. 09/10/22 09/10/23  Shon Hale, MD  Vitamin D, Ergocalciferol, (DRISDOL) 1.25 MG (50000 UT) CAPS capsule Take 50,000 Units by mouth every Saturday. 11/16/18   [provider]                                                                                                                                    Past Surgical History Past Surgical History:  Procedure Laterality Date   APPENDECTOMY     CATARACT EXTRACTION     CERVICAL SPINE SURGERY  2006   CHOLECYSTECTOMY N/A 04/07/2013   Procedure: LAPAROSCOPIC CHOLECYSTECTOMY WITH INTRAOPERATIVE CHOLANGIOGRAM;  Surgeon: Robyne Askew, MD;  Location: [facility];  Service: General;  Laterality: N/A;   COMBINED HYSTERECTOMY VAGINAL / OOPHORECTOMY / A&P REPAIR  1987   Unilateral oophorectomy, h/o uterine prolapse has right ovary   ERCP N/A 04/06/2013   Procedure: ENDOSCOPIC RETROGRADE CHOLANGIOPANCREATOGRAPHY (ERCP);  Surgeon: Theda Belfast, MD;  Location: [facility];  Service: Endoscopy;  Laterality: N/A;   ESOPHAGEAL BANDING N/A 09/26/2021   Procedure: ESOPHAGEAL BANDING;  Surgeon: Jeani Hawking, MD;  Location: [facility];  Service: Gastroenterology;  Laterality: N/A;   ESOPHAGEAL BANDING N/A 10/31/2021   Procedure: ESOPHAGEAL BANDING;  Surgeon: Jeani Hawking, MD;  Location: [facility];  Service: Gastroenterology;  Laterality: N/A;   ESOPHAGOGASTRODUODENOSCOPY (EGD) WITH PROPOFOL N/A 08/16/2020   Procedure: ESOPHAGOGASTRODUODENOSCOPY (EGD) WITH PROPOFOL;  Surgeon: Jeani Hawking, MD;   Location: [facility];  Service: Endoscopy;  Laterality: N/A;   ESOPHAGOGASTRODUODENOSCOPY (EGD) WITH PROPOFOL N/A 09/26/2021   Procedure: ESOPHAGOGASTRODUODENOSCOPY (EGD) WITH PROPOFOL;  Surgeon: Jeani Hawking, MD;  Location: [facility];  Service: Gastroenterology;  Laterality: N/A;   ESOPHAGOGASTRODUODENOSCOPY (EGD) WITH PROPOFOL N/A 10/31/2021   Procedure: ESOPHAGOGASTRODUODENOSCOPY (EGD) WITH PROPOFOL;  Surgeon: Jeani Hawking, MD;  Location: [facility];  Service: Gastroenterology;  Laterality: N/A;   EUS N/A 03/31/2013   Procedure: UPPER ENDOSCOPIC ULTRASOUND (EUS) LINEAR;  Surgeon: Theda Belfast, MD;  Location: [facility];  Service: Endoscopy;  Laterality: N/A;  REFRACTIVE SURGERY     SPINAL FUSION     c4-c7   TONSILLECTOMY  age 36   Family History Family History  Problem Relation Age of Onset   COPD Mother    Lung cancer Father    Diabetes Sister    Cataracts Sister    Insulin resistance Daughter    Insulin resistance Son     Social History Social History   Tobacco Use   Smoking status: Never   Smokeless tobacco: Never  Vaping Use   Vaping status: Never Used  Substance Use Topics   Alcohol use: No   Drug use: No   Allergies Sulfamethoxazole-trimethoprim, Atorvastatin, Ciprofloxacin, Codeine, Colesevelam, Ezetimibe, Lisinopril, Losartan potassium, Lovastatin, Pravastatin, Rosuvastatin, Statins, Dilaudid [hydromorphone hcl], and Hydromorphone  Review of Systems Review of Systems  Unable to perform ROS: Mental status change   *** Physical Exam Vital Signs  I have reviewed the triage vital signs Wt 96.2 kg   SpO2 99%   BMI 34.22 kg/m  *** Physical Exam Vitals and nursing note reviewed.  Constitutional:      General: She is not in acute distress.    Appearance: She is well-developed. She is ill-appearing.  HENT:     Head: Normocephalic and atraumatic.  Eyes:     General: Scleral icterus present.     Conjunctiva/sclera: Conjunctivae normal.   Cardiovascular:     Rate and Rhythm: Normal rate and regular rhythm.     Heart sounds: No murmur heard. Pulmonary:     Effort: Pulmonary effort is normal. No respiratory distress.     Breath sounds: Normal breath sounds.  Abdominal:     General: There is distension.     Palpations: Abdomen is soft.     Tenderness: There is no abdominal tenderness.  Musculoskeletal:        General: No swelling.     Cervical back: Neck supple.  Skin:    General: Skin is warm and dry.     Capillary Refill: Capillary refill takes less than 2 seconds.  Neurological:     Mental Status: She is alert.  Psychiatric:        Mood and Affect: Mood normal.     ED Results and Treatments Labs (all labs ordered are listed, but only abnormal results are displayed) Labs Reviewed  COMPREHENSIVE METABOLIC PANEL  CBC WITH DIFFERENTIAL/PLATELET  AMMONIA  URINALYSIS, ROUTINE W REFLEX MICROSCOPIC  TROPONIN I (HIGH SENSITIVITY)                                                                                                                          Radiology No results found.  Pertinent labs & imaging results that were available during my care of the patient were reviewed by me and considered in my medical decision making (see MDM for details).  Medications Ordered in ED Medications - No data to display  Procedures Procedures  (including critical care time)  Medical Decision Making / ED Course   This patient presents to the ED for concern of ***, this involves an extensive number of treatment options, and is a complaint that carries with it a high risk of complications and morbidity.  The differential diagnosis includes ***  MDM: ***   Additional history obtained: -Additional history obtained from *** -External records from outside source obtained and reviewed including:  Chart review including previous notes, labs, imaging, consultation notes   Lab Tests: -I ordered, reviewed, and interpreted labs.   The pertinent results include:   Labs Reviewed  COMPREHENSIVE METABOLIC PANEL  CBC WITH DIFFERENTIAL/PLATELET  AMMONIA  URINALYSIS, ROUTINE W REFLEX MICROSCOPIC  TROPONIN I (HIGH SENSITIVITY)      EKG ***  EKG Interpretation Date/Time:    Ventricular Rate:    PR Interval:    QRS Duration:    QT Interval:    QTC Calculation:   R Axis:      Text Interpretation:           Imaging Studies ordered: I ordered imaging studies including *** I independently visualized and interpreted imaging. I agree with the radiologist interpretation   Medicines ordered and prescription drug management: No orders of the defined types were placed in this encounter.   -I have reviewed the patients home medicines and have made adjustments as needed  Critical interventions ***  Consultations Obtained: I requested consultation with the ***,  and discussed lab and imaging findings as well as pertinent plan - they recommend: ***   Cardiac Monitoring: The patient was maintained on a cardiac monitor.  I personally viewed and interpreted the cardiac monitored which showed an underlying rhythm of: ***  Social Determinants of Health:  Factors impacting patients care include: ***   Reevaluation: After the interventions noted above, I reevaluated the patient and found that they have :{resolved/improved/worsened:23923::"improved"}  Co morbidities that complicate the patient evaluation  Past Medical History:  Diagnosis Date   Alkaline phosphatase elevation    Anemia    Anemia in stage 4 chronic kidney disease (HCC) 10/15/2022   ASD (atrial septal defect)    Cataract    both eyes hx of   Chest pain    03-17-2013 last chest pain   Chronic kidney disease    Stage III kidney disease   Cirrhosis (HCC)    Depression    Diabetes mellitus type II    Family  history of adverse reaction to anesthesia    Son hard to wake up   Fatigue    GERD (gastroesophageal reflux disease)    Gout    Headache(784.0)    occasional   Hyperlipidemia    Hypertension    Hypothyroidism    Neuropathy    Osteopenia    Peripheral neuropathy    Presence of pessary    Retinopathy       Dispostion: I considered admission for this patient, ***     Final Clinical Impression(s) / ED Diagnoses Final diagnoses:  None     @PCDICTATION @

## 2023-01-10 NOTE — ED Notes (Signed)
ED TO INPATIENT HANDOFF REPORT  ED Nurse Name and Phone #: Wandra Mannan, Paramedic  S Name/Age/Gender Val Eagle 72 y.o. female Room/Bed: APA03/APA03  Code Status   Code Status: Prior  Home/SNF/Other Home Patient oriented to: self Is this baseline? No   Triage Complete: Triage complete  Chief Complaint Acute hepatic encephalopathy [facility]) [K76.82]  Triage Note BIB [facility] from home for AMS, and NV. Yellow bile like emesis on gown. Sx ongoing for ~ 2d. Scheduled for paracentesis tomorrow at [facility], h/o cirrhosis. Abd and BLE edema noted, L>R. NSL 20g L FA. Alert, NAD, calm, confused, does not follow commands, and answers "Alexius" to many questions. VSS. BS 291.    Allergies Allergies  Allergen Reactions   Bactrim [Sulfamethoxazole-Trimethoprim] Nausea And Vomiting and Other (See Comments)    Dizziness   Cipro [Ciprofloxacin Hcl] Other (See Comments)    Severe yeast infection   Codeine Other (See Comments)    Unknown reaction   Cozaar [Losartan Potassium] Cough   Lipitor [Atorvastatin] Other (See Comments)    Arthralgias Myalgias   Mevacor [Lovastatin] Other (See Comments)    Arthralgias  Myalgias   Pravachol [Pravastatin] Other (See Comments)    Arthralgias  Myalgias    Rosuvastatin Diarrhea and Other (See Comments)    Arthralgias Myalgias   Statins Other (See Comments)    Arthralgias Myalgias   Welchol [Colesevelam] Other (See Comments)    Arthralgias Myalgias    Zestril [Lisinopril] Cough   Zetia [Ezetimibe] Other (See Comments)    Arthralgias Myalgias    Dilaudid [Hydromorphone] Nausea And Vomiting and Other (See Comments)    GI Intolerance     Level of Care/Admitting Diagnosis ED Disposition     ED Disposition  Admit   Condition  --   Comment  Hospital Area: [facility] [[id number]]  Level of Care: Telemetry [5]  Covid Evaluation: Asymptomatic - no recent exposure (last 10 days) testing not required  Diagnosis: Acute hepatic  encephalopathy [facility]) [[id number]]  Admitting Physician: Cresenciano Lick  Attending Physician: Onnie Boer Xenia.Douglas  Certification:: I certify this patient will need inpatient services for at least 2 midnights  Expected Medical Readiness: 01/12/2023          B Medical/Surgery History Past Medical History:  Diagnosis Date   Alkaline phosphatase elevation    Anemia    Anemia in stage 4 chronic kidney disease (HCC) 10/15/2022   ASD (atrial septal defect)    Cataract    both eyes hx of   Chest pain    03-17-2013 last chest pain   Chronic kidney disease    Stage III kidney disease   Cirrhosis (HCC)    Depression    Diabetes mellitus type II    Family history of adverse reaction to anesthesia    Son hard to wake up   Fatigue    GERD (gastroesophageal reflux disease)    Gout    Headache(784.0)    occasional   Hyperlipidemia    Hypertension    Hypothyroidism    Neuropathy    Osteopenia    Peripheral neuropathy    Presence of pessary    Retinopathy    Past Surgical History:  Procedure Laterality Date   APPENDECTOMY     CATARACT EXTRACTION     CERVICAL SPINE SURGERY  2006   CHOLECYSTECTOMY N/A 04/07/2013   Procedure: LAPAROSCOPIC CHOLECYSTECTOMY WITH INTRAOPERATIVE CHOLANGIOGRAM;  Surgeon: Robyne Askew, MD;  Location: [facility];  Service: General;  Laterality: N/A;   COMBINED HYSTERECTOMY VAGINAL / OOPHORECTOMY / A&P REPAIR  1987   Unilateral oophorectomy, h/o uterine prolapse has right ovary   ERCP N/A 04/06/2013   Procedure: ENDOSCOPIC RETROGRADE CHOLANGIOPANCREATOGRAPHY (ERCP);  Surgeon: Theda Belfast, MD;  Location: [facility];  Service: Endoscopy;  Laterality: N/A;   ESOPHAGEAL BANDING N/A 09/26/2021   Procedure: ESOPHAGEAL BANDING;  Surgeon: Jeani Hawking, MD;  Location: [facility];  Service: Gastroenterology;  Laterality: N/A;   ESOPHAGEAL BANDING N/A 10/31/2021   Procedure: ESOPHAGEAL BANDING;  Surgeon: Jeani Hawking, MD;  Location: [facility]  END[facility];  Service: Gastroenterology;  Laterality: N/A;   ESOPHAGOGASTRODUODENOSCOPY (EGD) WITH PROPOFOL N/A 08/16/2020   Procedure: ESOPHAGOGASTRODUODENOSCOPY (EGD) WITH PROPOFOL;  Surgeon: Jeani Hawking, MD;  Location: [facility];  Service: Endoscopy;  Laterality: N/A;   ESOPHAGOGASTRODUODENOSCOPY (EGD) WITH PROPOFOL N/A 09/26/2021   Procedure: ESOPHAGOGASTRODUODENOSCOPY (EGD) WITH PROPOFOL;  Surgeon: Jeani Hawking, MD;  Location: [facility];  Service: Gastroenterology;  Laterality: N/A;   ESOPHAGOGASTRODUODENOSCOPY (EGD) WITH PROPOFOL N/A 10/31/2021   Procedure: ESOPHAGOGASTRODUODENOSCOPY (EGD) WITH PROPOFOL;  Surgeon: Jeani Hawking, MD;  Location: [facility];  Service: Gastroenterology;  Laterality: N/A;   EUS N/A 03/31/2013   Procedure: UPPER ENDOSCOPIC ULTRASOUND (EUS) LINEAR;  Surgeon: Theda Belfast, MD;  Location: [facility];  Service: Endoscopy;  Laterality: N/A;   REFRACTIVE SURGERY     SPINAL FUSION     c4-c7   TONSILLECTOMY  age 65     A IV Location/Drains/Wounds Patient Lines/Drains/Airways Status     Active Line/Drains/Airways     Name Placement date Placement time Site Days   Peripheral IV 12/23/22 22 G Distal;Posterior;Right Forearm 12/23/22  1031  Forearm  18   Peripheral IV 01/10/23 20 G 1" Left;Posterior Forearm 01/10/23  1238  Forearm  less than 1   Wound / Incision (Open or Dehisced) 08/25/22 Non-pressure wound Buttocks Right;Lower Small dime sized open area noted pinkish red in color no drainage noted. 08/25/22  1700  Buttocks  138            Intake/Output Last 24 hours No intake or output data in the 24 hours ending 01/10/23 1640  Labs/Imaging Results for orders placed or performed during the hospital encounter of 01/10/23 (from the past 48 hour(s))  Comprehensive metabolic panel     Status: Abnormal   Collection Time: 01/10/23  1:09 PM  Result Value Ref Range   Sodium 135 135 - 145 mmol/L   Potassium 3.2 (L) 3.5 - 5.1 mmol/L   Chloride 103 98  - 111 mmol/L   CO2 22 22 - 32 mmol/L   Glucose, Bld 277 (H) 70 - 99 mg/dL    Comment: Glucose reference range applies only to samples taken after fasting for at least 8 hours.   BUN 72 (H) 8 - 23 mg/dL   Creatinine, Ser 1.61 (H) 0.44 - 1.00 mg/dL   Calcium 8.1 (L) 8.9 - 10.3 mg/dL   Total Protein 6.0 (L) 6.5 - 8.1 g/dL   Albumin 2.7 (L) 3.5 - 5.0 g/dL   AST 36 15 - 41 U/L   ALT 22 0 - 44 U/L   Alkaline Phosphatase 137 (H) 38 - 126 U/L   Total Bilirubin 2.0 (H) 0.3 - 1.2 mg/dL   GFR, Estimated 19 (L) >60 mL/min    Comment: (NOTE) Calculated using the CKD-EPI Creatinine Equation (2021)    Anion gap 10 5 - 15    Comment: Performed at [facility], [address] [place], [place] [address]  Troponin I (High Sensitivity)     Status: None   Collection Time: 01/10/23  1:09 PM  Result Value Ref Range   Troponin I (High Sensitivity) 9 <18 ng/L    Comment: (NOTE) Elevated high sensitivity troponin I (hsTnI) values and significant  changes across serial measurements may suggest ACS but many other  chronic and acute conditions are known to elevate hsTnI results.  Refer to the "Links" section for chest pain algorithms and additional  guidance. Performed at [facility], [address] [place], [place] [address]   CBC with Differential     Status: Abnormal   Collection Time: 01/10/23  1:09 PM  Result Value Ref Range   WBC 7.0 4.0 - 10.5 K/uL   RBC 3.55 (L) 3.87 - 5.11 MIL/uL   Hemoglobin 11.4 (L) 12.0 - 15.0 g/dL   HCT 21.3 (L) 08.6 - 57.8 %   MCV 100.8 (H) 80.0 - 100.0 fL   MCH 32.1 26.0 - 34.0 pg   MCHC 31.8 30.0 - 36.0 g/dL   RDW 46.9 (H) 62.9 - 52.8 %   Platelets 58 (L) 150 - 400 K/uL    Comment: SPECIMEN CHECKED FOR CLOTS Immature Platelet Fraction may be clinically indicated, consider ordering this additional test [id number] REPEATED TO VERIFY PLATELET COUNT CONFIRMED BY SMEAR    nRBC 0.0 0.0 - 0.2 %   Neutrophils Relative % 92 %   Neutro Abs 6.4 1.7 - 7.7 K/uL    Lymphocytes Relative 7 %   Lymphs Abs 0.5 (L) 0.7 - 4.0 K/uL   Monocytes Relative 0 %   Monocytes Absolute 0.0 (L) 0.1 - 1.0 K/uL   Eosinophils Relative 0 %   Eosinophils Absolute 0.0 0.0 - 0.5 K/uL   Basophils Relative 0 %   Basophils Absolute 0.0 0.0 - 0.1 K/uL   WBC Morphology Mild Left Shift (1-5% metas, occ myelo)    RBC Morphology MORPHOLOGY UNREMARKABLE    Smear Review PLATELET COUNT CONFIRMED BY SMEAR    Metamyelocytes Relative 1 %   Abs Immature Granulocytes 0.10 (H) 0.00 - 0.07 K/uL    Comment: Performed at [facility], [address] [place] [address] [place], [place] [address]  Ammonia     Status: Abnormal   Collection Time: 01/10/23  1:09 PM  Result Value Ref Range   Ammonia 130 (H) 9 - 35 umol/L    Comment: Performed at [facility], [address], [place], [place] [address]  Blood gas, venous (at [facility] and [facility])     Status: Abnormal   Collection Time: 01/10/23  1:09 PM  Result Value Ref Range   pH, Ven 7.42 7.25 - 7.43   pCO2, Ven 35 (L) 44 - 60 mmHg   pO2, Ven <31 (LL) 32 - 45 mmHg    Comment: CRITICAL RESULT CALLED TO, READ BACK BY AND VERIFIED WITH:  ABBY PETERMEN 01/10/23 1323 NN    Bicarbonate 23.6 20.0 - 28.0 mmol/L   Acid-base deficit 1.2 0.0 - 2.0 mmol/L   O2 Saturation 35.7 %   Patient temperature 34.6    Collection site BLOOD RIGHT ARM    Drawn by [phone]     Comment: Performed at [facility], [address] [place], [place] [address]  Urinalysis, Routine w reflex microscopic -Urine, Clean Catch     Status: Abnormal   Collection Time: 01/10/23  2:53 PM  Result Value Ref Range   Color, Urine YELLOW YELLOW   APPearance CLEAR CLEAR   Specific Gravity, Urine 1.013 1.005 - 1.030  pH 6.0 5.0 - 8.0   Glucose, UA NEGATIVE NEGATIVE mg/dL   Hgb urine dipstick SMALL (A) NEGATIVE   Bilirubin Urine NEGATIVE NEGATIVE   Ketones, ur NEGATIVE NEGATIVE mg/dL   Protein, ur NEGATIVE NEGATIVE mg/dL   Nitrite NEGATIVE NEGATIVE   Leukocytes,Ua NEGATIVE NEGATIVE   RBC / HPF 0-5 0 - 5  RBC/hpf   WBC, UA 0-5 0 - 5 WBC/hpf   Bacteria, UA RARE (A) NONE SEEN   Squamous Epithelial / HPF 0-5 0 - 5 /HPF   Mucus PRESENT    Hyaline Casts, UA PRESENT    Amorphous Crystal PRESENT     Comment: Performed at [facility], [address], [place], [place] [address]  Troponin I (High Sensitivity)     Status: None   Collection Time: 01/10/23  3:00 PM  Result Value Ref Range   Troponin I (High Sensitivity) 9 <18 ng/L    Comment: (NOTE) Elevated high sensitivity troponin I (hsTnI) values and significant  changes across serial measurements may suggest ACS but many other  chronic and acute conditions are known to elevate hsTnI results.  Refer to the "Links" section for chest pain algorithms and additional  guidance. Performed at [facility], [address], [place], [place] [address]    CT Head Wo Contrast  Result Date: 01/10/2023 CLINICAL DATA:  Delirium EXAM: CT HEAD WITHOUT CONTRAST TECHNIQUE: Contiguous axial images were obtained from the base of the skull through the vertex without intravenous contrast. RADIATION DOSE REDUCTION: This exam was performed according to the departmental dose-optimization program which includes automated exposure control, adjustment of the mA and/or kV according to patient size and/or use of iterative reconstruction technique. COMPARISON:  CT head 08/25/22 FINDINGS: Brain: No hemorrhage. No hydrocephalus. No extra-axial fluid collection. No CT evidence of an acute cortical infarct. No mass effect. No mass lesion. Vascular: No hyperdense vessel or unexpected calcification. Skull: Normal. Negative for fracture or focal lesion. Sinuses/Orbits: No middle ear or mastoid effusion. Paranasal sinuses are clear. Right lens replacement. Orbits are otherwise unremarkable. Other: None. IMPRESSION: No acute intracranial abnormality. Electronically Signed   By: Lorenza Cambridge M.D.   On: 01/10/2023 16:06    Pending Labs Unresulted Labs (From admission, onward)    None        Vitals/Pain Today's Vitals   01/10/23 1244 01/10/23 1300 01/10/23 1316 01/10/23 1600  BP:  (!) 104/53  130/62  Pulse:  69  85  Resp:  18  (!) 24  Temp:   (!) 94.3 F (34.6 C)   TempSrc:   Rectal   SpO2:  97%  95%  Weight: 212 lb (96.2 kg)       Isolation Precautions No active isolations  Medications Medications  lactulose (CHRONULAC) 10 GM/15ML solution 30 g (30 g Oral Given 01/10/23 1439)    Mobility walks with device     Focused Assessments Neuro Assessment Handoff:  Swallow screen pass? Yes  Cardiac Rhythm: Normal sinus rhythm       Neuro Assessment:   Neuro Checks:      Has TPA been given? No If patient is a Neuro Trauma and patient is going to OR before floor call report to 4N Charge nurse: [phone] or [phone]   R Recommendations: See Admitting Provider Note  Report given to:   Additional Notes: Bair- Hugger. Incontinent. Respond

## 2023-01-10 NOTE — Assessment & Plan Note (Addendum)
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  Nadolol and propranolol both on home med list. -Resume nadolol.

## 2023-01-10 NOTE — Assessment & Plan Note (Signed)
No prior history of hepatic encephalopathy.  Ammonia level elevated at 130.  Head CT negative for acute abnormality.  She also appears dehydrated.  Need to rule out SBP. -30 g lactulose given in ED, patient refused subsequent dose, will switch to lactulose enema, continue 3 times daily for now switch to oral when able -Follow-up ascites fluid analysis -IV ceftriaxone -Hydrate

## 2023-01-10 NOTE — Assessment & Plan Note (Signed)
Abdomen markedly distended, tender palpation.  Afebrile.  WBC 7.  Tenderness may be secondary to distention versus SBP. -IV ceftriaxone 2 g daily for now - US guided paracentesis with fluid analysis in a.m.

## 2023-01-10 NOTE — Assessment & Plan Note (Signed)
Platelets 58, at baseline.  Likely secondary to NASH liver cirrhosis

## 2023-01-11 ENCOUNTER — Inpatient Hospital Stay (HOSPITAL_COMMUNITY): Payer: Medicare Other

## 2023-01-11 ENCOUNTER — Encounter (HOSPITAL_COMMUNITY): Payer: Self-pay | Admitting: Internal Medicine

## 2023-01-11 ENCOUNTER — Ambulatory Visit (HOSPITAL_COMMUNITY): Payer: Medicare Other

## 2023-01-11 DIAGNOSIS — K7460 Unspecified cirrhosis of liver: Secondary | ICD-10-CM

## 2023-01-11 DIAGNOSIS — K7581 Nonalcoholic steatohepatitis (NASH): Secondary | ICD-10-CM

## 2023-01-11 DIAGNOSIS — K7682 Hepatic encephalopathy: Secondary | ICD-10-CM | POA: Diagnosis not present

## 2023-01-11 DIAGNOSIS — N179 Acute kidney failure, unspecified: Secondary | ICD-10-CM | POA: Diagnosis not present

## 2023-01-11 DIAGNOSIS — Z862 Personal history of diseases of the blood and blood-forming organs and certain disorders involving the immune mechanism: Secondary | ICD-10-CM | POA: Diagnosis not present

## 2023-01-11 LAB — BODY FLUID CELL COUNT WITH DIFFERENTIAL
Eos, Fluid: 0 %
Lymphs, Fluid: 1 %
Monocyte-Macrophage-Serous Fluid: 4 % — ABNORMAL LOW (ref 50–90)
Neutrophil Count, Fluid: 95 % — ABNORMAL HIGH (ref 0–25)
Total Nucleated Cell Count, Fluid: 11303 uL — ABNORMAL HIGH (ref 0–1000)

## 2023-01-11 LAB — GRAM STAIN

## 2023-01-11 LAB — GLUCOSE, CAPILLARY
Glucose-Capillary: 118 mg/dL — ABNORMAL HIGH (ref 70–99)
Glucose-Capillary: 145 mg/dL — ABNORMAL HIGH (ref 70–99)
Glucose-Capillary: 147 mg/dL — ABNORMAL HIGH (ref 70–99)
Glucose-Capillary: 163 mg/dL — ABNORMAL HIGH (ref 70–99)
Glucose-Capillary: 166 mg/dL — ABNORMAL HIGH (ref 70–99)
Glucose-Capillary: 204 mg/dL — ABNORMAL HIGH (ref 70–99)
Glucose-Capillary: 233 mg/dL — ABNORMAL HIGH (ref 70–99)

## 2023-01-11 LAB — PROTEIN, PLEURAL OR PERITONEAL FLUID: Total protein, fluid: <3 g/dL

## 2023-01-11 LAB — CBC
HCT: 28.8 % — ABNORMAL LOW (ref 36.0–46.0)
Hemoglobin: 9.4 g/dL — ABNORMAL LOW (ref 12.0–15.0)
MCH: 33 pg (ref 26.0–34.0)
MCHC: 32.6 g/dL (ref 30.0–36.0)
MCV: 101.1 fL — ABNORMAL HIGH (ref 80.0–100.0)
Platelets: 45 10*3/uL — ABNORMAL LOW (ref 150–400)
RBC: 2.85 MIL/uL — ABNORMAL LOW (ref 3.87–5.11)
RDW: 15.9 % — ABNORMAL HIGH (ref 11.5–15.5)
WBC: 11.4 10*3/uL — ABNORMAL HIGH (ref 4.0–10.5)
nRBC: 0 % (ref 0.0–0.2)

## 2023-01-11 LAB — AMMONIA: Ammonia: 43 umol/L — ABNORMAL HIGH (ref 9–35)

## 2023-01-11 LAB — HEMOGLOBIN A1C
Hgb A1c MFr Bld: 6.7 % — ABNORMAL HIGH (ref 4.8–5.6)
Mean Plasma Glucose: 145.59 mg/dL

## 2023-01-11 LAB — GLUCOSE, PLEURAL OR PERITONEAL FLUID: Glucose, Fluid: 130 mg/dL

## 2023-01-11 LAB — BASIC METABOLIC PANEL
Anion gap: 11 (ref 5–15)
BUN: 77 mg/dL — ABNORMAL HIGH (ref 8–23)
CO2: 20 mmol/L — ABNORMAL LOW (ref 22–32)
Calcium: 8.2 mg/dL — ABNORMAL LOW (ref 8.9–10.3)
Chloride: 109 mmol/L (ref 98–111)
Creatinine, Ser: 2.77 mg/dL — ABNORMAL HIGH (ref 0.44–1.00)
GFR, Estimated: 18 mL/min — ABNORMAL LOW (ref >60)
Glucose, Bld: 189 mg/dL — ABNORMAL HIGH (ref 70–99)
Potassium: 2.7 mmol/L — CL (ref 3.5–5.1)
Sodium: 140 mmol/L (ref 135–145)

## 2023-01-11 LAB — LACTATE DEHYDROGENASE, PLEURAL OR PERITONEAL FLUID: LD, Fluid: 59 U/L — ABNORMAL HIGH (ref 3–23)

## 2023-01-11 MED ORDER — SODIUM CHLORIDE 0.9 % IV BOLUS
500.0000 mL | Freq: Once | INTRAVENOUS | Status: AC
  Administered 2023-01-11: 500 mL via INTRAVENOUS

## 2023-01-11 MED ORDER — ALBUMIN HUMAN 25 % IV SOLN
25.0000 g | Freq: Once | INTRAVENOUS | Status: AC
  Administered 2023-01-11: 25 g via INTRAVENOUS
  Filled 2023-01-11: qty 100

## 2023-01-11 MED ORDER — MIDODRINE HCL 5 MG PO TABS
10.0000 mg | ORAL_TABLET | Freq: Three times a day (TID) | ORAL | Status: DC
  Administered 2023-01-12 – 2023-01-19 (×24): 10 mg via ORAL
  Filled 2023-01-11 (×24): qty 2

## 2023-01-11 MED ORDER — POTASSIUM CHLORIDE 10 MEQ/100ML IV SOLN
10.0000 meq | INTRAVENOUS | Status: AC
  Administered 2023-01-11 (×4): 10 meq via INTRAVENOUS
  Filled 2023-01-11 (×4): qty 100

## 2023-01-11 NOTE — Progress Notes (Signed)
PROGRESS NOTE   [name]  ZOX:[id number] DOB: 01/18/1951 DOA: 01/10/2023 PCP: Benita Stabile, MD   Chief Complaint  Patient presents with   Altered Mental Status   Level of care: Telemetry  Brief Admission History:  72 y.o. female with medical history significant for liver cirrhosis, diabetes mellitus, hypertension, CKD.  Patient was brought to the ED with reports of confusion, progressive abdominal and leg swelling, and vomiting, slow to respond.  Family is at bedside and provides the history.  At the time of my evaluation patient is awake, lethargic, not answering questions.  Family reports overnight patient had multiple episodes of vomiting, and has barely had any oral intake for the past couple of days.  No diarrhea.  She has never been on lactulose or rifaximin.  She is due for paracentesis 10/14, she gets this every 9 to 10 days.   ED Course: Temperature down to 94.3, improved with warming blanket.  Heart rate 69 to 90s.  Respirate rate 18-24.  Blood pressure systolic 102-130.  O2 sats greater than 95% on room air.  Ammonia 130.  WBC 7.  VBG shows pH of 7.4, pCO2 of 35.  Troponin 9 x 2.  UA not suggestive of UTI.  Head CT negative for acute abnormality.  Lactulose 30 g given in ED.  Hospitalist admit for acute hepatic encephalopathy.    Assessment and Plan:  Acute hepatic encephalopathy No prior history of hepatic encephalopathy.  Ammonia level elevated at 130.  Head CT negative for acute abnormality.  She also appears dehydrated.  Need to rule out SBP. -lactulose enemas ordered, fecal management system ordered  -IV ceftriaxone -ammonia down to 48   SBP  Abdomen markedly distended, tender palpation.  Afebrile.  WBC 7.  Tenderness may be secondary to distention versus SBP. -IV ceftriaxone 2 g daily x 5 days  - US guided paracentesis completed 10/14, followed by IV albumin 25 gm   History of thrombocytopenia Platelets 58, at baseline.  Likely secondary to NASH liver  cirrhosis  Acute kidney injury superimposed on stage 4 chronic kidney disease Cr 2.67, recent baseline 1.8-2.  Likely prerenal- multiple episodes of vomiting overnight, poor oral intake.  Also need to come consider hepatorenal considering- bilateral lower extremity edema and ascites. No significant urine output thus far,.  Prior history of urinary retention. 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  Nadolol and propranolol both on home med list. -Resume nadolol when able to take p.o.  -prognosis poor, if no meaningful improvement would ask for palliative consult -pt is DNR/DNI.  Hypertension with renal disease Stable.  Nadolol and propranolol on home med list.  Type 2 diabetes mellitus with diabetic chronic kidney disease (HCC) Diabetes mellitus with hyperglycemia. - SSI- M - HgbA1c -Hold home NPH 12 units, and Novolin CBG (last 3)  Recent Labs    01/11/23 0400 01/11/23 0725 01/11/23 1107  GLUCAP 204* 147* 118*   DVT prophylaxis: SCDs Code Status: DNR  Family Communication: bedside update  Disposition: TBD    Consultants:   Procedures:  Paracentesis 10/14, albumin IV given Antimicrobials:  Ceftriaxone 2 gm IV 10/13>>  Subjective: Pt obtunded.   Objective: Vitals:   01/11/23 1200 01/11/23 1210 01/11/23 1220 01/11/23 1311  BP: (!) 145/54 126/61 (!) 125/58 109/60  Pulse: 85 84 87 80  Resp: 18 16 18  18  Temp:    97.8 F (36.6 C)  TempSrc:    Oral  SpO2: 96% 96% 96% 100%  Weight:      Height:        Intake/Output Summary (Last 24 hours) at 01/11/2023 1614 Last data filed at 01/11/2023 1501 Gross per 24 hour  Intake 1967.27 ml  Output --  Net 1967.27 ml   Filed Weights   01/10/23 1244 01/10/23 1808  Weight: 96.2 kg 96.2 kg    Examination:  General exam: pt appears lethargic, chronically ill  Respiratory system: Clear to auscultation. Respiratory effort normal. Cardiovascular system: normal S1 & S2 heard. No JVD, murmurs, rubs, gallops or clicks. No pedal edema. Gastrointestinal system: Abdomen is distended with ascites, tense to palpate. Normal bowel sounds heard. Central nervous system: obtunded. No focal neurological deficits. Extremities: moving extremities spontaneously. Skin: No rashes, lesions or ulcers. Psychiatry: Judgement and insight UTD. Mood & affect UTD.   Data Reviewed: I have personally reviewed following labs and imaging studies  CBC: Recent Labs  Lab 01/07/23 1305 01/10/23 1309 01/11/23 0407  WBC 8.5 7.0 11.4*  NEUTROABS  --  6.4  --   HGB 10.4* 11.4* 9.4*  HCT 31.9* 35.8* 28.8*  MCV 101.3* 100.8* 101.1*  PLT 60* 58* 45*    Basic Metabolic Panel: Recent Labs  Lab 01/10/23 1309 01/11/23 0407  NA 135 140  K 3.2* 2.7*  CL 103 109  CO2 22 20*  GLUCOSE 277* 189*  BUN 72* 77*  CREATININE 2.67* 2.77*  CALCIUM 8.1* 8.2*    CBG: Recent Labs  Lab 01/10/23 2106 01/11/23 0055 01/11/23 0400 01/11/23 0725 01/11/23 1107  GLUCAP 230* 233* 204* 147* 118*    Recent Results (from the past 240 hour(s))  Gram stain     Status: None   Collection Time: 01/11/23 11:45 AM   Specimen: Ascitic  Result Value Ref Range Status   Specimen Description ASCITIC  Final   Special Requests NONE  Final   Gram Stain   Final    WBC PRESENT, PREDOMINANTLY PMN NO ORGANISMS SEEN CYTOSPIN SMEAR Performed at [facility], [address], [place], [place] [address]    Report Status 01/11/2023 FINAL  Final     Radiology Studies: US Paracentesis  Result Date: 01/11/2023 INDICATION: Abdominal ascites. EXAM: ULTRASOUND GUIDED  PARACENTESIS MEDICATIONS: None. COMPLICATIONS: None immediate. PROCEDURE: Informed written consent was obtained from the patient after a discussion of the risks, benefits  and alternatives to treatment. A timeout was performed prior to the initiation of the procedure. Initial ultrasound scanning demonstrates a large amount of ascites within the left lower abdominal quadrant. The left lower abdomen was prepped and draped in the usual sterile fashion. 1% lidocaine was used for local anesthesia. Following this, a 19 gauge, 10-cm, Yueh catheter was introduced. Initial attempt with the patient in the left lateral decubitus position just below the umbilicus was unsuccessful. We then prepped more to the left and superior. An ultrasound image was saved for documentation purposes. The paracentesis was performed. The catheter was removed and a dressing was applied. The patient tolerated the procedure well without immediate post procedural complication. Patient received post-procedure intravenous albumin; see nursing notes for details. FINDINGS: A total of approximately 5.5 L of yellow fluid was removed. Samples were sent to the laboratory as requested by the clinical team. IMPRESSION: Successful ultrasound-guided paracentesis yielding 5.5 liters of peritoneal fluid. Electronically Signed   By: Marin Roberts M.D.   On: 01/11/2023 13:33  CT Head Wo Contrast  Result Date: 01/10/2023 CLINICAL DATA:  Delirium EXAM: CT HEAD WITHOUT CONTRAST TECHNIQUE: Contiguous axial images were obtained from the base of the skull through the vertex without intravenous contrast. RADIATION DOSE REDUCTION: This exam was performed according to the departmental dose-optimization program which includes automated exposure control, adjustment of the mA and/or kV according to patient size and/or use of iterative reconstruction technique. COMPARISON:  CT head 08/25/22 FINDINGS: Brain: No hemorrhage. No hydrocephalus. No extra-axial fluid collection. No CT evidence of an acute cortical infarct. No mass effect. No mass lesion. Vascular: No hyperdense vessel or unexpected calcification. Skull: Normal. Negative for  fracture or focal lesion. Sinuses/Orbits: No middle ear or mastoid effusion. Paranasal sinuses are clear. Right lens replacement. Orbits are otherwise unremarkable. Other: None. IMPRESSION: No acute intracranial abnormality. Electronically Signed   By: Lorenza Cambridge M.D.   On: 01/10/2023 16:06    Scheduled Meds:  insulin aspart  0-15 Units Subcutaneous Q4H   lactulose  30 g Oral Once   lactulose  300 mL Rectal BID   levothyroxine  125 mcg Oral Q0600   nadolol  40 mg Oral Daily   sodium bicarbonate  1,300 mg Oral BID   Continuous Infusions:  cefTRIAXone (ROCEPHIN)  IV Stopped (01/11/23 0030)     LOS: 1 day   Time spent: 55 mins  Kaleth Koy Laural Benes, MD How to contact the [facility] Attending or Consulting provider 7A - 7P or covering provider during after hours 7P -7A, for this patient?  Check the care team in [facility] and look for a) attending/consulting [facility] provider listed and b) the [facility] team listed Log into [email or web address] to find provider on call.  Locate the [facility] provider you are looking for under [facility] and page to a number that you can be directly reached. If you still have difficulty reaching the provider, please page the [facility] (Director on Call) for the Hospitalists listed on amion for assistance.  01/11/2023, 4:14 PM

## 2023-01-11 NOTE — Hospital Course (Addendum)
72 y.o. female with medical history significant for liver cirrhosis, diabetes mellitus, hypertension, CKD.  Patient was brought to the ED with reports of confusion, progressive abdominal and leg swelling, and vomiting, slow to respond.  Family is at bedside and provides the history.  At the time of my evaluation patient is awake, lethargic, not answering questions.  Family reports overnight patient had multiple episodes of vomiting, and has barely had any oral intake for the past couple of days.  No diarrhea.  She has never been on lactulose or rifaximin.  She is due for paracentesis 10/14, she gets this every 9 to 10 days.   ED Course: Temperature down to 94.3, improved with warming blanket.  Heart rate 69 to 90s.  Respirate rate 18-24.  Blood pressure systolic 102-130.  O2 sats greater than 95% on room air.  Ammonia 130.  WBC 7.  VBG shows pH of 7.4, pCO2 of 35.  Troponin 9 x 2.  UA not suggestive of UTI.  Head CT negative for acute abnormality.  Lactulose 30 g given in ED.  Hospitalist admit for acute hepatic encephalopathy.

## 2023-01-11 NOTE — Progress Notes (Signed)
Patient tolerated left sided paracentesis procedure well today and 5.5 Liters of clear yellow ascites removed with labs collected and sent for processing. Consent obtained by Dr. Alfredo Batty and son [name]) at bedside due to patient's acute confusion. Son verbalized understanding of post procedure instructions. Patient transported back to inpatient bed assignment at this time via hospital bed with no acute distress noted.

## 2023-01-11 NOTE — Progress Notes (Signed)
Date and time results received: 01/11/23 0613 (use smartphrase ".now" to insert current time)  Test: K+  Critical Value: 2.7  Name of Provider Notified: Zierle-Ghosh  Orders Received? Or Actions Taken?: awaiting orders

## 2023-01-11 NOTE — Progress Notes (Signed)
Night shift nurse reported patient had several loose stools last night, noted patient had loose bowel movement while doing skin assessment. Patient cleaned up. Patient having discomfort and irritation to sacrum. MD Laural Benes made aware. New order placed. Psychologist, prison and probation services and this Human resources officer. Noted lactulose enema just running out of tube, patient unable to retain enema. MD Laural Benes made aware. No new orders. Reported to MD that patient has not been able to take PO medications this morning due to mentation.

## 2023-01-11 NOTE — Progress Notes (Signed)
Patient more alert after paracentesis, still having some episodes of confusion. Flexiseal intact and having output. Applied cream to sacrum due to discomfort..Patient repositioned in bed during shift and placed pillows between legs for comfort.

## 2023-01-11 NOTE — TOC Initial Note (Signed)
Transition of Care [facility]) - Initial/Assessment Note    Patient Details  Name: [name] MRN: [id number] Date of Birth: Jun 03, 1950  Transition of Care [facility]) CM/SW Contact:    Karn Cassis, LCSW Phone Number: 01/11/2023, 8:53 AM  Clinical Narrative:  Pt admitted for acute hepatic encephalopathy. Assessment completed due to high risk readmission score. Pt's daughter reports pt lives with her granddaughter who is with her 24/7. She requires assist with ADLs. Pt recently finished home health with [facility] and daughter would like to have HHPT, OT, aide again. Morrie Sheldon with [facility] accepts. TOC will follow.                    Expected Discharge Plan: Home w Home Health Services Barriers to Discharge: Continued Medical Work up   Patient Goals and CMS Choice Patient states their goals for this hospitalization and ongoing recovery are:: return home   Choice offered to / list presented to : Adult Children [place] ownership interest in [facility].provided to::  (n/a)    Expected Discharge Plan and Services In-house Referral: Clinical Social Work   Post Acute Care Choice: Home Health Living arrangements for the past 2 months: Single Family Home                           HH Arranged: PT, OT, Nurse's Aide HH Agency: [facility] Date HH Agency Contacted: 01/11/23 Time HH Agency Contacted: [phone] Representative spoke with at [facility] Agency: Morrie Sheldon  Prior Living Arrangements/Services Living arrangements for the past 2 months: Single Family Home Lives with:: Relatives Patient language and need for interpreter reviewed:: Yes Do you feel safe going back to the place where you live?: Yes      Need for Family Participation in Patient Care: Yes (Comment) Care giver support system in place?: Yes (comment) Current home services: DME (ramp, rollator, wheelchair, shower chair) Criminal Activity/Legal Involvement Pertinent to Current Situation/Hospitalization: No -  Comment as needed  Activities of Daily Living   ADL Screening (condition at time of admission) Independently performs ADLs?: No Does the patient have a NEW difficulty with bathing/dressing/toileting/self-feeding that is expected to last >3 days?: No Does the patient have a NEW difficulty with getting in/out of bed, walking, or climbing stairs that is expected to last >3 days?: No Does the patient have a NEW difficulty with communication that is expected to last >3 days?: No Is the patient deaf or have difficulty hearing?: No Does the patient have difficulty seeing, even when wearing glasses/contacts?: No Does the patient have difficulty concentrating, remembering, or making decisions?: No  Permission Sought/Granted                  Emotional Assessment       Orientation: : Oriented to Self Alcohol / Substance Use: Not Applicable Psych Involvement: No (comment)  Admission diagnosis:  Hepatic encephalopathy syndrome (HCC) [K76.82] Thrombocytopenia (HCC) [D69.6] AKI (acute kidney injury) (HCC) [N17.9] 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, K74.60] Acute hepatic encephalopathy (HCC) [K76.82] Acute alteration in mental status [R41.82] Patient Active Problem List   Diagnosis Date Noted   Acute hepatic encephalopathy (HCC) 01/10/2023   Ascites 01/10/2023   Anemia in stage 4 chronic kidney disease (HCC) 10/15/2022   Complete prolapse of vaginal vault 09/15/2022   Urinary retention 09/15/2022   Vaginal pessary present 09/15/2022   Elevated brain natriuretic peptide (BNP) level 09/03/2022   Hypoalbuminemia due to protein-calorie malnutrition (  HCC) 09/03/2022   Prolonged QT interval 08/25/2022   Vaginal prolapse 08/25/2022   Acute kidney injury superimposed on stage 4 chronic kidney disease (HCC) 08/25/2022   Obesity 06/16/2022   Cirrhosis, non-alcoholic (HCC) 12/04/2020   Vitamin D deficiency 11/26/2020   Mixed hyperlipidemia 11/26/2020   Gout 11/26/2020   History  of thrombocytopenia 11/26/2020   Thrombocytopenia (HCC) 07/30/2017   CKD stage 3 due to type 2 diabetes mellitus (HCC) 06/30/2017   Liver cirrhosis secondary to NASH (HCC) 06/30/2017   Hypertension with renal disease 03/19/2017   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/19/2017   Tremor 08/06/2016   Vitreous hemorrhage of left eye (HCC) 01/29/2015   Diabetic macular edema (HCC) 12/25/2014   Hypertensive retinopathy of both eyes 12/25/2014   Proliferative diabetic retinopathy (HCC) 08/04/2011   Bilateral nondiabetic proliferative retinopathy 04/03/2011   Type 2 diabetes mellitus with diabetic chronic kidney disease (HCC) 02/05/2010   Acquired hypothyroidism 03/20/2008   RENAL DISEASE, CHRONIC, STAGE II 03/20/2008   Hereditary and idiopathic peripheral neuropathy 02/21/2008   Pseudophakia of both eyes 02/21/2008   GERD 02/21/2008   PCP:  Benita Stabile, MD Pharmacy:   [facility] [address] [place] - [address] [place] [address] [place] [address] Phone: [phone] Fax: [phone]     Social Determinants of Health (SDOH) Social History: SDOH Screenings   Food Insecurity: No Food Insecurity (01/10/2023)  Housing: Low Risk  (01/10/2023)  Transportation Needs: No Transportation Needs (01/10/2023)  Utilities: Not At Risk (01/10/2023)  Alcohol Screen: Low Risk  (10/30/2019)  Depression (PHQ2-9): Medium Risk (10/30/2019)  Financial Resource Strain: Medium Risk (10/30/2019)  Physical Activity: Inactive (10/30/2019)  Social Connections: Moderately Integrated (10/30/2019)  Stress: No Stress Concern Present (10/30/2019)  Tobacco Use: Low Risk  (01/10/2023)   SDOH Interventions: Utilities Interventions: Intervention Not Indicated   Readmission Risk Interventions    01/11/2023    8:51 AM 09/04/2022   12:38 PM  Readmission Risk Prevention Plan  Transportation Screening Complete Complete  HRI or Home Care Consult Complete Complete  Social Work Consult for  Recovery Care Planning/Counseling Complete Complete  Palliative Care Screening Not Applicable Complete  Medication Review Oceanographer) Complete Complete

## 2023-01-12 ENCOUNTER — Ambulatory Visit: Payer: Medicare Other | Admitting: Obstetrics & Gynecology

## 2023-01-12 DIAGNOSIS — K652 Spontaneous bacterial peritonitis: Secondary | ICD-10-CM | POA: Diagnosis not present

## 2023-01-12 DIAGNOSIS — K7581 Nonalcoholic steatohepatitis (NASH): Secondary | ICD-10-CM | POA: Diagnosis not present

## 2023-01-12 DIAGNOSIS — N179 Acute kidney failure, unspecified: Secondary | ICD-10-CM | POA: Diagnosis not present

## 2023-01-12 DIAGNOSIS — K7682 Hepatic encephalopathy: Secondary | ICD-10-CM | POA: Diagnosis not present

## 2023-01-12 DIAGNOSIS — Z862 Personal history of diseases of the blood and blood-forming organs and certain disorders involving the immune mechanism: Secondary | ICD-10-CM | POA: Diagnosis not present

## 2023-01-12 LAB — COMPREHENSIVE METABOLIC PANEL
ALT: 18 U/L (ref 0–44)
AST: 34 U/L (ref 15–41)
Albumin: 2.2 g/dL — ABNORMAL LOW (ref 3.5–5.0)
Alkaline Phosphatase: 74 U/L (ref 38–126)
Anion gap: 9 (ref 5–15)
BUN: 71 mg/dL — ABNORMAL HIGH (ref 8–23)
CO2: 20 mmol/L — ABNORMAL LOW (ref 22–32)
Calcium: 7.7 mg/dL — ABNORMAL LOW (ref 8.9–10.3)
Chloride: 112 mmol/L — ABNORMAL HIGH (ref 98–111)
Creatinine, Ser: 2.34 mg/dL — ABNORMAL HIGH (ref 0.44–1.00)
GFR, Estimated: 22 mL/min — ABNORMAL LOW (ref >60)
Glucose, Bld: 118 mg/dL — ABNORMAL HIGH (ref 70–99)
Potassium: 2.9 mmol/L — ABNORMAL LOW (ref 3.5–5.1)
Sodium: 141 mmol/L (ref 135–145)
Total Bilirubin: 1.2 mg/dL (ref 0.3–1.2)
Total Protein: 4.3 g/dL — ABNORMAL LOW (ref 6.5–8.1)

## 2023-01-12 LAB — GLUCOSE, CAPILLARY
Glucose-Capillary: 111 mg/dL — ABNORMAL HIGH (ref 70–99)
Glucose-Capillary: 127 mg/dL — ABNORMAL HIGH (ref 70–99)
Glucose-Capillary: 182 mg/dL — ABNORMAL HIGH (ref 70–99)
Glucose-Capillary: 168 mg/dL — ABNORMAL HIGH (ref 70–99)
Glucose-Capillary: 202 mg/dL — ABNORMAL HIGH (ref 70–99)

## 2023-01-12 LAB — AMMONIA: Ammonia: 47 umol/L — ABNORMAL HIGH (ref 9–35)

## 2023-01-12 MED ORDER — ALLOPURINOL 100 MG PO TABS
100.0000 mg | ORAL_TABLET | Freq: Every evening | ORAL | Status: DC
  Administered 2023-01-12 – 2023-01-18 (×7): 100 mg via ORAL
  Filled 2023-01-12 (×7): qty 1

## 2023-01-12 MED ORDER — PROPRANOLOL HCL 20 MG PO TABS
20.0000 mg | ORAL_TABLET | Freq: Every day | ORAL | Status: DC
  Administered 2023-01-12 – 2023-01-19 (×8): 20 mg via ORAL
  Filled 2023-01-12 (×8): qty 1

## 2023-01-12 MED ORDER — ALBUMIN HUMAN 25 % IV SOLN
12.5000 g | Freq: Four times a day (QID) | INTRAVENOUS | Status: AC
  Administered 2023-01-12 – 2023-01-13 (×4): 12.5 g via INTRAVENOUS
  Filled 2023-01-12 (×4): qty 50

## 2023-01-12 MED ORDER — LACTULOSE 10 GM/15ML PO SOLN
20.0000 g | Freq: Two times a day (BID) | ORAL | Status: DC
  Administered 2023-01-12 – 2023-01-13 (×3): 20 g via ORAL
  Filled 2023-01-12 (×3): qty 30

## 2023-01-12 MED ORDER — SODIUM CHLORIDE 0.9 % IV BOLUS
250.0000 mL | Freq: Once | INTRAVENOUS | Status: AC
  Administered 2023-01-12: 250 mL via INTRAVENOUS

## 2023-01-12 MED ORDER — RIFAXIMIN 550 MG PO TABS
550.0000 mg | ORAL_TABLET | Freq: Two times a day (BID) | ORAL | Status: DC
  Administered 2023-01-12 – 2023-01-19 (×15): 550 mg via ORAL
  Filled 2023-01-12 (×15): qty 1

## 2023-01-12 MED ORDER — NYSTATIN 100000 UNIT/GM EX POWD
1.0000 | Freq: Three times a day (TID) | CUTANEOUS | Status: DC
  Administered 2023-01-12 – 2023-01-19 (×21): 1 via TOPICAL
  Filled 2023-01-12 (×3): qty 15

## 2023-01-12 NOTE — Plan of Care (Signed)
  Problem: Education: Goal: Knowledge of General Education information will improve Description Including pain rating scale, medication(s)/side effects and non-pharmacologic comfort measures Outcome: Progressing   Problem: Health Behavior/Discharge Planning: Goal: Ability to manage health-related needs will improve Outcome: Progressing   

## 2023-01-12 NOTE — Consult Note (Addendum)
@LOGO @   Referring Provider: [facility]  Primary Care Physician:  Benita Stabile, MD Primary Gastroenterologist:  Dr. Loreta Ave  Date of Admission: 01/10/23 Date of Consultation: 01/12/23  Reason for Consultation:  SBP  HPI:  [name] is a 72 y.o. year old female with history of NASH cirrhosis, esohpageal varices s/p banding, ascites requiring paracentesis, GERD,  diabetes mellitus, hypertension, HLD, CKD, chronic anemia, who presented to the ER 10/13 for confusion, progressive abdominal and leg swelling, vomiting.  ED Course: Temperature down to 94.3, improved with warming blanket.  Heart rate 69 to 90s.  Respirate rate 18-24.  Blood pressure systolic 102-130.  O2 sats greater than 95% on room air.  Ammonia 130.  WBC 7.  VBG shows pH of 7.4, pCO2 of 35.  Troponin 9 x 2.  UA not suggestive of UTI.  Head CT negative for acute abnormality.  Lactulose 30 g given in ED.  Hospitalist admit for acute hepatic encephalopathy.   Upon admission, she started on lactulose enemas, IV ceftriaxone empirically, and plan to rule out SBP.  Paracentesis completed 10/14 with PMNs 10,737.  Gram stain negative.  Culture with no growth so far.   Today:  Son is at bedside who helps provide history today.  Reports she was in her usual state of health until Sunday when she began having nausea and vomiting.  Son states he received a call from his niece stating that his mom had been vomiting all night on Sunday into Monday and had become very confused.  She was alert, but not communicating.  States she was just staring off.  Initially thought that she was having a stroke.  They called EMS.  Patient does not remember much of any of this.  Clinically, patient is feeling much better today.  No further nausea or vomiting.  Mental status is also much improved.  Feels close to baseline, but still slightly "off".  Son states they have noticed some intermittent confusion over this last year, but has never been on anything  for hepatic encephalopathy such as lactulose or Xifaxan.  She follows with Dr. Loreta Ave in [place] for cirrhosis.  She last saw Dr. Loreta Ave a few months ago.  States that she has to have paracentesis every 9 to 14 days.  She for started having paracentesis in June.  No history of SBP.  She does take torsemide every other day.  She is limited on her diuretics due to chronic kidney disease.  She had also been noticing some lower extremity edema prior to admission.    Since she started getting paracentesis, she has had some intermittent sharp abdominal pain on the right side, but nothing has ever been persistent.  No specific triggers.  She did not notice any change with this pain recently.  Denies any chronic problems with nausea or vomiting though she does have a poor appetite.  No BRBPR, melena, hematemesis.  On propranolol for esophageal varices. Hasn't been able to get to repeat EGD that was to be earlier this year.  Reports she has not been able to tolerate drinking colon prep for repeat colonoscopy.  No NSAIDs.   Past Medical History:  Diagnosis Date   Alkaline phosphatase elevation    Anemia    Anemia in stage 4 chronic kidney disease (HCC) 10/15/2022   ASD (atrial septal defect)    Cataract    both eyes hx of   Chest pain    03-17-2013 last chest pain   Chronic kidney disease  Stage III kidney disease   Cirrhosis (HCC)    Depression    Diabetes mellitus type II    Family history of adverse reaction to anesthesia    Son hard to wake up   Fatigue    GERD (gastroesophageal reflux disease)    Gout    Headache(784.0)    occasional   Hyperlipidemia    Hypertension    Hypothyroidism    Neuropathy    Osteopenia    Peripheral neuropathy    Presence of pessary    Retinopathy     Past Surgical History:  Procedure Laterality Date   APPENDECTOMY     CATARACT EXTRACTION     CERVICAL SPINE SURGERY  2006   CHOLECYSTECTOMY N/A 04/07/2013   Procedure: LAPAROSCOPIC CHOLECYSTECTOMY  WITH INTRAOPERATIVE CHOLANGIOGRAM;  Surgeon: Robyne Askew, MD;  Location: [facility];  Service: General;  Laterality: N/A;   COMBINED HYSTERECTOMY VAGINAL / OOPHORECTOMY / A&P REPAIR  1987   Unilateral oophorectomy, h/o uterine prolapse has right ovary   ERCP N/A 04/06/2013   Procedure: ENDOSCOPIC RETROGRADE CHOLANGIOPANCREATOGRAPHY (ERCP);  Surgeon: Theda Belfast, MD;  Location: [facility];  Service: Endoscopy;  Laterality: N/A;   ESOPHAGEAL BANDING N/A 09/26/2021   Procedure: ESOPHAGEAL BANDING;  Surgeon: Jeani Hawking, MD;  Location: [facility];  Service: Gastroenterology;  Laterality: N/A;   ESOPHAGEAL BANDING N/A 10/31/2021   Procedure: ESOPHAGEAL BANDING;  Surgeon: Jeani Hawking, MD;  Location: [facility];  Service: Gastroenterology;  Laterality: N/A;   ESOPHAGOGASTRODUODENOSCOPY (EGD) WITH PROPOFOL N/A 08/16/2020   Procedure: ESOPHAGOGASTRODUODENOSCOPY (EGD) WITH PROPOFOL;  Surgeon: Jeani Hawking, MD;  Location: [facility];  Service: Endoscopy;  Laterality: N/A;   ESOPHAGOGASTRODUODENOSCOPY (EGD) WITH PROPOFOL N/A 09/26/2021   Procedure: ESOPHAGOGASTRODUODENOSCOPY (EGD) WITH PROPOFOL;  Surgeon: Jeani Hawking, MD;  Location: [facility];  Service: Gastroenterology;  Laterality: N/A;   ESOPHAGOGASTRODUODENOSCOPY (EGD) WITH PROPOFOL N/A 10/31/2021   Procedure: ESOPHAGOGASTRODUODENOSCOPY (EGD) WITH PROPOFOL;  Surgeon: Jeani Hawking, MD;  Location: [facility];  Service: Gastroenterology;  Laterality: N/A;   EUS N/A 03/31/2013   Procedure: UPPER ENDOSCOPIC ULTRASOUND (EUS) LINEAR;  Surgeon: Theda Belfast, MD;  Location: [facility];  Service: Endoscopy;  Laterality: N/A;   REFRACTIVE SURGERY     SPINAL FUSION     c4-c7   TONSILLECTOMY  age 18    Prior to Admission medications   Medication Sig Start Date End Date Taking? Authorizing Provider  acetaminophen (TYLENOL) 325 MG tablet Take 2 tablets (650 mg total) by mouth every 6 (six) hours as needed for mild pain (or Fever >/=  101). 09/10/22  Yes Emokpae, Courage, MD  allopurinol (ZYLOPRIM) 100 MG tablet Take 100 mg by mouth every evening.    Yes [provider]  benzonatate (TESSALON) 200 MG capsule Take 200 mg by mouth 3 (three) times daily as needed for cough.   Yes [provider]  insulin NPH Human (NOVOLIN N RELION) 100 UNIT/ML injection Inject 16 Units into the skin in the morning and at bedtime.   Yes [provider]  insulin regular (NOVOLIN R RELION) 100 units/mL injection Inject 5 Units into the skin 3 (three) times daily before meals.   Yes [provider]  levothyroxine (SYNTHROID) 125 MCG tablet Take 125 mcg by mouth daily before breakfast. 09/03/22  Yes [provider]  nadolol (CORGARD) 40 MG tablet Take 40 mg by mouth daily.   Yes [provider]  nystatin (MYCOSTATIN/NYSTOP) powder Apply 1 Application topically 3 (three) times daily. 09/29/22  Yes  [provider]  ondansetron (ZOFRAN-ODT) 4 MG disintegrating tablet Take 4 mg by mouth every 8 (eight) hours as needed. 09/24/22  Yes [provider]  propranolol (INDERAL) 40 MG tablet Take 0.5 tablets (20 mg total) by mouth daily. 09/10/22  Yes Emokpae, Courage, MD  sodium bicarbonate 650 MG tablet Take 2 tablets (1,300 mg total) by mouth 2 (two) times daily. 09/10/22 09/10/23 Yes Emokpae, Courage, MD  torsemide (DEMADEX) 20 MG tablet Take 20 mg by mouth every other day.   Yes [provider]  Vitamin D, Ergocalciferol, (DRISDOL) 1.25 MG (50000 UT) CAPS capsule Take 50,000 Units by mouth every Wednesday. 11/16/18  Yes [provider]    Current Facility-Administered Medications  Medication Dose Route Frequency Provider Last Rate Last Admin   acetaminophen (TYLENOL) tablet 650 mg  650 mg Oral Q6H PRN Emokpae, Ejiroghene E, MD       Or   acetaminophen (TYLENOL) suppository 650 mg  650 mg Rectal Q6H PRN Emokpae, Ejiroghene E, MD       albumin human 25 % solution 12.5 g  12.5 g  Intravenous Q6H Ermalinda Memos S, PA-C 60 mL/hr at 01/12/23 1211 12.5 g at 01/12/23 1211   allopurinol (ZYLOPRIM) tablet 100 mg  100 mg Oral QPM Johnson, Clanford L, MD       cefTRIAXone (ROCEPHIN) 2 g in sodium chloride 0.9 % 100 mL IVPB  2 g Intravenous Q24H Johnson, Clanford L, MD   Stopped at 01/11/23 2346   insulin aspart (novoLOG) injection 0-15 Units  0-15 Units Subcutaneous Q4H Emokpae, Ejiroghene E, MD   3 Units at 01/12/23 1201   lactulose (CHRONULAC) 10 GM/15ML solution 20 g  20 g Oral BID Johnson, Clanford L, MD   20 g at 01/12/23 0859   levothyroxine (SYNTHROID) tablet 125 mcg  125 mcg Oral Q0600 Emokpae, Ejiroghene E, MD   125 mcg at 01/12/23 0458   midodrine (PROAMATINE) tablet 10 mg  10 mg Oral TID WC Zierle-Ghosh, Asia B, DO   10 mg at 01/12/23 1200   nystatin (MYCOSTATIN/NYSTOP) topical powder 1 Application  1 Application Topical TID Laural Benes, Clanford L, MD   1 Application at 01/12/23 0900   promethazine (PHENERGAN) tablet 12.5 mg  12.5 mg Oral Q6H PRN Emokpae, Ejiroghene E, MD       propranolol (INDERAL) tablet 20 mg  20 mg Oral Daily Johnson, Clanford L, MD   20 mg at 01/12/23 0900   rifaximin (XIFAXAN) tablet 550 mg  550 mg Oral BID Ermalinda Memos S, PA-C   550 mg at 01/12/23 1200   sodium bicarbonate tablet 1,300 mg  1,300 mg Oral BID Emokpae, Ejiroghene E, MD   1,300 mg at 01/12/23 0900    Allergies as of 01/10/2023 - Review Complete 01/10/2023  Allergen Reaction Noted   Bactrim [sulfamethoxazole-trimethoprim] Nausea And Vomiting and Other (See Comments) 09/03/2022   Cipro [ciprofloxacin hcl] Other (See Comments)    Codeine Other (See Comments) 10/19/2018   Cozaar [losartan potassium] Cough 11/25/2020   Lipitor [atorvastatin] Other (See Comments) 11/25/2020   Mevacor [lovastatin] Other (See Comments) 11/25/2020   Pravachol [pravastatin] Other (See Comments) 09/18/2022   Rosuvastatin Diarrhea and Other (See Comments) 11/25/2020   Statins Other (See Comments) 11/25/2020    Welchol [colesevelam] Other (See Comments) 11/25/2020   Zestril [lisinopril] Cough 11/25/2020   Zetia [ezetimibe] Other (See Comments) 11/25/2020   Dilaudid [hydromorphone] Nausea And Vomiting and Other (See Comments) 03/31/2013    Family History  Problem Relation Age of  Onset   COPD Mother    Lung cancer Father    Diabetes Sister    Cataracts Sister    Insulin resistance Daughter    Insulin resistance Son     Social History   Socioeconomic History   Marital status: Divorced    Spouse name: Not on file   Number of children: 3   Years of education: 12   Highest education level: Not on file  Occupational History   Occupation: [employer]-Retired    Associate Professor: [employer]  Tobacco Use   Smoking status: Never   Smokeless tobacco: Never  Vaping Use   Vaping status: Never Used  Substance and Sexual Activity   Alcohol use: No   Drug use: No   Sexual activity: Not Currently    Birth control/protection: Surgical    Comment: hyst  Other Topics Concern   Not on file  Social History Narrative   Divorced   Lives alone. Reports that her son recently moved out after living with her for a long time. She reports that she is happy to be living alone and feels like she was enabling his behavior. Reports that he had a history of drug use.   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    No pets.   Eats all food groups.    Wears seat belt.    Lives in house.    Smoke detectors.    Social Determinants of Health   Financial Resource Strain: Medium Risk (10/30/2019)   Overall Financial Resource Strain (CARDIA)    Difficulty of Paying Living Expenses: Somewhat hard  Food Insecurity: No Food Insecurity (01/10/2023)   Hunger Vital Sign    Worried About Running Out of Food in the Last Year: Never true    Ran Out of Food in the Last Year: Never true  Transportation Needs: No Transportation Needs (01/10/2023)   PRAPARE -  Administrator, Civil Service (Medical): No    Lack of Transportation (Non-Medical): No  Physical Activity: Inactive (10/30/2019)   Exercise Vital Sign    Days of Exercise per Week: 0 days    Minutes of Exercise per Session: 0 min  Stress: No Stress Concern Present (10/30/2019)   [employer] of Occupational Health - Occupational Stress Questionnaire    Feeling of Stress : Not at all  Social Connections: Moderately Integrated (10/30/2019)   Social Connection and Isolation Panel [NHANES]    Frequency of Communication with Friends and Family: More than three times a week    Frequency of Social Gatherings with Friends and Family: Three times a week    Attends Religious Services: More than 4 times per year    Active Member of Clubs or Organizations: Yes    Attends Banker Meetings: 1 to 4 times per year    Marital Status: Divorced  Intimate Partner Violence: Not At Risk (01/10/2023)   Humiliation, Afraid, Rape, and Kick questionnaire    Fear of Current or Ex-Partner: No    Emotionally Abused: No    Physically Abused: No    Sexually Abused: No    Review of Systems: Gen: Denies fever, chills, cold or flulike symptoms, presyncope, syncope. CV: Denies chest pain, heart palpitations. Resp: Denies shortness of breath, cough. GI: See HPI GU : Denies urinary burning, urinary frequency, urinary incontinence.  MS: Denies joint pain. Derm: Denies rash. Psych: Denies depression, anxiety.  Heme: See HPI  Physical Exam: Vital signs in last 24 hours: Temp:  [97.7 F (36.5 C)-98.4 F (36.9 C)] 98.4 F (36.9 C) (10/15 1315) Pulse Rate:  [61-83] 61 (10/15 1315) Resp:  [16-20] 16 (10/15 1315) BP: (98-142)/(31-57) 142/57 (10/15 1315) SpO2:  [98 %-100 %] 98 % (10/15 1315) Last BM Date : 01/12/23 General:   Alert,  Well-developed, well-nourished, pleasant and cooperative in NAD Head:  Normocephalic and atraumatic. Eyes:  Sclera clear, no icterus.   Conjunctiva  pink. Ears:  Normal auditory acuity. Lungs:  Clear throughout to auscultation.   No wheezes, crackles, or rhonchi. No acute distress. Heart:  Regular rate and rhythm; no murmurs, clicks, rubs,  or gallops. Abdomen:  Soft, and nondistended. Mild TTP in left abdomen at paracentesis site and in epigastric area. No masses, hepatosplenomegaly or hernias noted. Normal bowel sounds, without guarding, and without rebound.   Rectal:  Deferred  Msk:  Symmetrical without gross deformities. Normal posture. Extremities:  Without edema. Neurologic:  Alert and  oriented x3.  Able to tell me name, date of birth, location, situation.  States that it is 2021 and reports, Eldridge Dace is president.  No asterixis. Skin:  Intact without significant lesions or rashes. Psych: Normal mood and affect.  Intake/Output from previous day: 10/14 0701 - 10/15 0700 In: 2287.3 [P.O.:340; I.V.:1274.6; IV Piggyback:672.7] Out: 1100 [Urine:400; Stool:700] Intake/Output this shift: Total I/O In: 120 [P.O.:120] Out: -   Lab Results: Recent Labs    01/10/23 1309 01/11/23 0407 01/12/23 0424  WBC 7.0 11.4* 6.2  HGB 11.4* 9.4* 8.2*  HCT 35.8* 28.8* 25.3*  PLT 58* 45* 32*   BMET Recent Labs    01/10/23 1309 01/11/23 0407 01/12/23 0424  NA 135 140 141  K 3.2* 2.7* 2.9*  CL 103 109 112*  CO2 22 20* 20*  GLUCOSE 277* 189* 118*  BUN 72* 77* 71*  CREATININE 2.67* 2.77* 2.34*  CALCIUM 8.1* 8.2* 7.7*   LFT Recent Labs    01/10/23 1309 01/12/23 0424  PROT 6.0* 4.3*  ALBUMIN 2.7* 2.2*  AST 36 34  ALT 22 18  ALKPHOS 137* 74  BILITOT 2.0* 1.2   Studies/Results: US Paracentesis  Result Date: 01/11/2023 INDICATION: Abdominal ascites. EXAM: ULTRASOUND GUIDED  PARACENTESIS MEDICATIONS: None. COMPLICATIONS: None immediate. PROCEDURE: Informed written consent was obtained from the patient after a discussion of the risks, benefits and alternatives to treatment. A timeout was performed prior to the initiation of  the procedure. Initial ultrasound scanning demonstrates a large amount of ascites within the left lower abdominal quadrant. The left lower abdomen was prepped and draped in the usual sterile fashion. 1% lidocaine was used for local anesthesia. Following this, a 19 gauge, 10-cm, Yueh catheter was introduced. Initial attempt with the patient in the left lateral decubitus position just below the umbilicus was unsuccessful. We then prepped more to the left and superior. An ultrasound image was saved for documentation purposes. The paracentesis was performed. The catheter was removed and a dressing was applied. The patient tolerated the procedure well without immediate post procedural complication. Patient received post-procedure intravenous albumin; see nursing notes for details. FINDINGS: A total of approximately 5.5 L of yellow fluid was removed. Samples were sent to the laboratory as requested by the clinical team. IMPRESSION: Successful ultrasound-guided paracentesis yielding 5.5 liters of peritoneal fluid. Electronically Signed   By: Marin Roberts M.D.   On: 01/11/2023 13:33   CT Head Wo Contrast  Result Date: 01/10/2023 CLINICAL DATA:  Delirium  EXAM: CT HEAD WITHOUT CONTRAST TECHNIQUE: Contiguous axial images were obtained from the base of the skull through the vertex without intravenous contrast. RADIATION DOSE REDUCTION: This exam was performed according to the departmental dose-optimization program which includes automated exposure control, adjustment of the mA and/or kV according to patient size and/or use of iterative reconstruction technique. COMPARISON:  CT head 08/25/22 FINDINGS: Brain: No hemorrhage. No hydrocephalus. No extra-axial fluid collection. No CT evidence of an acute cortical infarct. No mass effect. No mass lesion. Vascular: No hyperdense vessel or unexpected calcification. Skull: Normal. Negative for fracture or focal lesion. Sinuses/Orbits: No middle ear or mastoid effusion.  Paranasal sinuses are clear. Right lens replacement. Orbits are otherwise unremarkable. Other: None. IMPRESSION: No acute intracranial abnormality. Electronically Signed   By: Lorenza Cambridge M.D.   On: 01/10/2023 16:06    Impression: 72 y.o. year old female with history of NASH cirrhosis, esohpageal varices s/p banding, ascites requiring paracentesis, colon polyps overdue for surveillance, GERD,  diabetes mellitus, hypertension, HLD, CKD, chronic anemia, who presented to the ER 10/13 for confusion, progressive abdominal and leg swelling, vomiting, now admitted with SBP and hepatic encephalopathy.   SBP: Paracentesis 10/14 yielding 5.5 L.  PMNs 10,737.  Gram stain negative.  Culture with no growth so far.  Started on IV Rocephin 2 g daily on 10/14 which will need to be continued for 5 days.  I will also give IV albumin today and she should have it again on day 3 in light of CKD.   Clinically, she is feeling well without any significant abdominal pain. No prior history of SBP.  She will need to be on prophylaxis moving forward.  Notably, Bactrim and Cipro are on allergy list. Will discuss with Dr. Levon Hedger. Will also need to stop BB.  Hepatic encephalopathy: Son notes intermittent confusion over the last year, likely related to mild hepatic encephalopathy.  Now with acute worsening of symptoms likely driven by SBP.  Clinically improved with lactulose the patient reports she is not quite back to baseline.  No asterixis on exam today.  I will continue current dose of lactulose and add Xifaxan.  Cirrhosis:  Complicated by esophageal varices s/p multiple banding's previously maintained on nonselective beta-blocker, but this would need to be discontinued due to SBP.  Also with ascites requiring frequent paracentesis as diuretics are limited in the setting of CKD.  Also with coagulopathy in the setting of thrombocytopenia, platelets 32K today.  No recent INR on file to calculate MELD.  She follows with Dr.  Loreta Ave in [place].  She was to undergo surveillance EGD earlier this year, but it has not been completed.  Anemia:  Multifactorial in setting of iron deficiency, anemia of chronic disease in setting of cirrhosis with portal hypertensive gastropathy and CKD, following with hematology outpatient.  She has received IV iron and started on Retacrit in July.  Hemoglobin is at baseline/somewhat improved to 11.4-day of admission which may have been hemoconcentrated in the setting of nausea and vomiting.  Hemoglobin declined to 8.2 today, but no overt GI bleeding.  This may be in part due to hemodilution as she has received IV fluids and albumin.  Recommend continuing to monitor H&H and for overt GI bleeding.  Needs to follow-up with primary GI in the outpatient setting for surveillance EGD and colonoscopy as had been previously recommended.  No indication for inpatient procedures at this time.    Plan: Albumin 25g q6 hours x 4 doses. Will need repeat albumin on day  3 (10/17).  Continue Rocephin 2g daily for 5 days (Started evening of 10/14).  Continue Lactulose 20 g BID.  Start Xifaxan 550 mg BID Stop propranolol in the setting of SBP.  Will check INR along with CMP in the am for MELD calculation.  Continue to monitor H/H and for overt GI bleeding.  Will need ongoing SBP prophylaxis.  Will discuss recommended regimen with Dr. Levon Hedger as Bactrim and Cipro are on allergy list. Needs outpatient follow-up with Dr. Loreta Ave for cirrhosis care and scheduling surveillance EGD/colonoscopy.    LOS: 2 days    01/12/2023, 2:54 PM   Ermalinda Memos, [facility]

## 2023-01-12 NOTE — Progress Notes (Signed)
   01/12/23 0000  Vitals  BP (!) 103/36  MAP (mmHg) (!) 54  Pulse Rate 77  MEWS COLOR  MEWS Score Color Green  MEWS Score  MEWS Temp 0  MEWS Systolic 0  MEWS Pulse 0  MEWS RR 0  MEWS LOC 0  MEWS Score 0   Dr. Carren Rang aware of BP, no new orders at this time.

## 2023-01-12 NOTE — Progress Notes (Signed)
PROGRESS NOTE   [name]  NFA:[id number] DOB: Dec 26, 1950 DOA: 01/10/2023 PCP: Benita Stabile, MD   Chief Complaint  Patient presents with   Altered Mental Status   Level of care: Telemetry  Brief Admission History:  72 y.o. female with medical history significant for liver cirrhosis, diabetes mellitus, hypertension, CKD.  Patient was brought to the ED with reports of confusion, progressive abdominal and leg swelling, and vomiting, slow to respond.  Family is at bedside and provides the history.  At the time of my evaluation patient is awake, lethargic, not answering questions.  Family reports overnight patient had multiple episodes of vomiting, and has barely had any oral intake for the past couple of days.  No diarrhea.  She has never been on lactulose or rifaximin.  She is due for paracentesis 10/14, she gets this every 9 to 10 days.   ED Course: Temperature down to 94.3, improved with warming blanket.  Heart rate 69 to 90s.  Respirate rate 18-24.  Blood pressure systolic 102-130.  O2 sats greater than 95% on room air.  Ammonia 130.  WBC 7.  VBG shows pH of 7.4, pCO2 of 35.  Troponin 9 x 2.  UA not suggestive of UTI.  Head CT negative for acute abnormality.  Lactulose 30 g given in ED.  Hospitalist admit for acute hepatic encephalopathy.    Assessment and Plan:  Acute hepatic encephalopathy No prior history of hepatic encephalopathy.  Ammonia level elevated at 130.  Head CT negative for acute abnormality.  She also appears dehydrated.  Need to rule out SBP. -lactulose enemas ordered, fecal management system ordered  -IV ceftriaxone -ammonia down to 47  -mentation immediately improved after paracentesis on 10/14 5.5L cloudy ascitic fluid removed -lactulose ordered BID to maintain lower ammonia levels (pt says she had not been on this before)  SBP  Abdomen markedly distended, tender palpation.  Afebrile.  WBC 7.  Tenderness may be secondary to distention versus SBP. -IV ceftriaxone 2  g daily x 5 days  - US guided paracentesis completed 10/14, followed by IV albumin 25 gm   History of thrombocytopenia Platelets 58, at baseline.  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  Likely prerenal- multiple episodes of vomiting overnight, poor oral intake.  Also need to come consider hepatorenal considering- bilateral lower extremity edema and ascites. No significant urine output thus far,.  Prior history of urinary retention. 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  Nadolol and propranolol both on home med list. -Resumed home nadolol  -pt is DNR/DNI.  Hypertension with renal disease BP stable, following  Type 2 diabetes mellitus with diabetic chronic kidney disease (HCC) Diabetes mellitus with hyperglycemia. - SSI- M - HgbA1c -Held home NPH 12 units, and Novolin CBG (last 3)  Recent Labs    01/12/23 0404 01/12/23 0716 01/12/23 1107  GLUCAP 111* 127* 182*   DVT prophylaxis: SCDs Code Status: DNR  Family Communication: bedside update son 10/14, 10/15 Disposition: anticipate home in 1-2 days   Consultants:  GI  Procedures:  Paracentesis 10/14, albumin IV given  Antimicrobials:  Ceftriaxone 2 gm IV 10/13>>   Subjective: Pt is awake, alert, oriented today;  felt immediately better after para on 10/14   Objective:  Vitals:   01/12/23 0224 01/12/23 0228 01/12/23 0401 01/12/23 0559  BP: (!) 98/31 (!) 109/43 (!) 101/39 (!) 122/52  Pulse: 75 72 72   Resp:   16   Temp:    97.7 F (36.5 C)  TempSrc:    Oral  SpO2:   99%   Weight:      Height:        Intake/Output Summary (Last 24 hours) at 01/12/2023 1237 Last data  filed at 01/12/2023 0900 Gross per 24 hour  Intake 2407.27 ml  Output 1100 ml  Net 1307.27 ml   Filed Weights   01/10/23 1244 01/10/23 1808  Weight: 96.2 kg 96.2 kg   Examination:  General exam: pt appears lethargic, chronically ill  Respiratory system: Clear to auscultation. Respiratory effort normal. Cardiovascular system: normal S1 & S2 heard. No JVD, murmurs, rubs, gallops or clicks. No pedal edema. Gastrointestinal system: Abdomen is distended with ascites, tense to palpate. Normal bowel sounds heard. Central nervous system: obtunded. No focal neurological deficits. Extremities: moving extremities spontaneously. Skin: No rashes, lesions or ulcers. Psychiatry: Judgement and insight UTD. Mood & affect UTD.   Data Reviewed: I have personally reviewed following labs and imaging studies  CBC: Recent Labs  Lab 01/07/23 1305 01/10/23 1309 01/11/23 0407 01/12/23 0424  WBC 8.5 7.0 11.4* 6.2  NEUTROABS  --  6.4  --  4.9  HGB 10.4* 11.4* 9.4* 8.2*  HCT 31.9* 35.8* 28.8* 25.3*  MCV 101.3* 100.8* 101.1* 100.4*  PLT 60* 58* 45* 32*    Basic Metabolic Panel: Recent Labs  Lab 01/10/23 1309 01/11/23 0407 01/12/23 0424  NA 135 140 141  K 3.2* 2.7* 2.9*  CL 103 109 112*  CO2 22 20* 20*  GLUCOSE 277* 189* 118*  BUN 72* 77* 71*  CREATININE 2.67* 2.77* 2.34*  CALCIUM 8.1* 8.2* 7.7*    CBG: Recent Labs  Lab 01/11/23 2034 01/11/23 2335 01/12/23 0404 01/12/23 0716 01/12/23 1107  GLUCAP 166* 163* 111* 127* 182*    Recent Results (from the past 240 hour(s))  Gram stain     Status: None   Collection Time: 01/11/23 11:45 AM   Specimen: Ascitic  Result Value Ref Range Status   Specimen Description ASCITIC  Final   Special Requests NONE  Final   Gram Stain   Final    WBC PRESENT, PREDOMINANTLY PMN NO ORGANISMS SEEN CYTOSPIN SMEAR Performed at [facility], [address], [place], [place] [address]    Report Status 01/11/2023 FINAL  Final  Culture, body fluid w  Gram Stain-bottle     Status: None (Preliminary result)   Collection Time: 01/11/23 11:45 AM   Specimen: Peritoneal Washings  Result Value Ref Range Status   Specimen Description PERITONEAL ASCITIC  Final   Special Requests NONE  Final   Culture   Final    NO GROWTH < 24 HOURS Performed at [facility], [address], [place], [place] [address]    Report Status PENDING  Incomplete     Radiology Studies: US Paracentesis  Result Date: 01/11/2023 INDICATION: Abdominal ascites. EXAM: ULTRASOUND GUIDED  PARACENTESIS MEDICATIONS: None. COMPLICATIONS: None immediate. PROCEDURE: Informed written consent was obtained from the patient after a discussion of the risks, benefits and alternatives to treatment. A timeout was performed prior to the initiation of the procedure. Initial ultrasound scanning demonstrates a large amount of ascites within the left lower abdominal quadrant. The left lower abdomen was prepped and draped in the usual sterile fashion. 1% lidocaine was  used for local anesthesia. Following this, a 19 gauge, 10-cm, Yueh catheter was introduced. Initial attempt with the patient in the left lateral decubitus position just below the umbilicus was unsuccessful. We then prepped more to the left and superior. An ultrasound image was saved for documentation purposes. The paracentesis was performed. The catheter was removed and a dressing was applied. The patient tolerated the procedure well without immediate post procedural complication. Patient received post-procedure intravenous albumin; see nursing notes for details. FINDINGS: A total of approximately 5.5 L of yellow fluid was removed. Samples were sent to the laboratory as requested by the clinical team. IMPRESSION: Successful ultrasound-guided paracentesis yielding 5.5 liters of peritoneal fluid. Electronically Signed   By: Marin Roberts M.D.   On: 01/11/2023 13:33   CT Head Wo Contrast  Result Date: 01/10/2023 CLINICAL DATA:   Delirium EXAM: CT HEAD WITHOUT CONTRAST TECHNIQUE: Contiguous axial images were obtained from the base of the skull through the vertex without intravenous contrast. RADIATION DOSE REDUCTION: This exam was performed according to the departmental dose-optimization program which includes automated exposure control, adjustment of the mA and/or kV according to patient size and/or use of iterative reconstruction technique. COMPARISON:  CT head 08/25/22 FINDINGS: Brain: No hemorrhage. No hydrocephalus. No extra-axial fluid collection. No CT evidence of an acute cortical infarct. No mass effect. No mass lesion. Vascular: No hyperdense vessel or unexpected calcification. Skull: Normal. Negative for fracture or focal lesion. Sinuses/Orbits: No middle ear or mastoid effusion. Paranasal sinuses are clear. Right lens replacement. Orbits are otherwise unremarkable. Other: None. IMPRESSION: No acute intracranial abnormality. Electronically Signed   By: Lorenza Cambridge M.D.   On: 01/10/2023 16:06    Scheduled Meds:  allopurinol  100 mg Oral QPM   insulin aspart  0-15 Units Subcutaneous Q4H   lactulose  20 g Oral BID   levothyroxine  125 mcg Oral Q0600   midodrine  10 mg Oral TID WC   nystatin  1 Application Topical TID   propranolol  20 mg Oral Daily   rifaximin  550 mg Oral BID   sodium bicarbonate  1,300 mg Oral BID   Continuous Infusions:  albumin human 12.5 g (01/12/23 1211)   cefTRIAXone (ROCEPHIN)  IV Stopped (01/11/23 2346)     LOS: 2 days   Time spent: 55 mins  Nahmir Zeidman Laural Benes, MD How to contact the [facility] Attending or Consulting provider 7A - 7P or covering provider during after hours 7P -7A, for this patient?  Check the care team in [facility] and look for a) attending/consulting TRH provider listed and b) the [facility] team listed Log into [email or web address] to find provider on call.  Locate the [facility] provider you are looking for under [facility] and page to a number that you can be directly reached. If you  still have difficulty reaching the provider, please page the [facility] (Director on Call) for the Hospitalists listed on amion for assistance.  01/12/2023, 12:37 PM

## 2023-01-12 NOTE — Progress Notes (Signed)
   01/12/23 0228  Vitals  BP (!) 109/43  MAP (mmHg) (!) 63  BP Method Automatic  Pulse Rate 72  MEWS COLOR  MEWS Score Color Green  MEWS Score  MEWS Temp 0  MEWS Systolic 0  MEWS Pulse 0  MEWS RR 0  MEWS LOC 0  MEWS Score 0     01/12/23 0228  Vitals  BP (!) 109/43  MAP (mmHg) (!) 63  BP Method Automatic  Pulse Rate 72  MEWS COLOR  MEWS Score Color Green  MEWS Score  MEWS Temp 0  MEWS Systolic 0  MEWS Pulse 0  MEWS RR 0  MEWS LOC 0  MEWS Score 0   Dr. Carren Rang made aware of new BP and that patient is seeing people who are not present in room.

## 2023-01-13 DIAGNOSIS — K7682 Hepatic encephalopathy: Secondary | ICD-10-CM | POA: Diagnosis not present

## 2023-01-13 LAB — CBC WITH DIFFERENTIAL/PLATELET
Abs Immature Granulocytes: 0.02 10*3/uL (ref 0.00–0.07)
Basophils Absolute: 0 10*3/uL (ref 0.0–0.1)
Basophils Relative: 0 %
Eosinophils Absolute: 0.2 10*3/uL (ref 0.0–0.5)
Eosinophils Relative: 3 %
HCT: 26.6 % — ABNORMAL LOW (ref 36.0–46.0)
Hemoglobin: 8.7 g/dL — ABNORMAL LOW (ref 12.0–15.0)
Immature Granulocytes: 0 %
Lymphocytes Relative: 17 %
Lymphs Abs: 1.1 10*3/uL (ref 0.7–4.0)
MCH: 33.2 pg (ref 26.0–34.0)
MCHC: 32.7 g/dL (ref 30.0–36.0)
MCV: 101.5 fL — ABNORMAL HIGH (ref 80.0–100.0)
Monocytes Absolute: 0.4 10*3/uL (ref 0.1–1.0)
Monocytes Relative: 7 %
Neutro Abs: 4.7 10*3/uL (ref 1.7–7.7)
Neutrophils Relative %: 73 %
Platelets: 36 10*3/uL — ABNORMAL LOW (ref 150–400)
RBC: 2.62 MIL/uL — ABNORMAL LOW (ref 3.87–5.11)
RDW: 16.2 % — ABNORMAL HIGH (ref 11.5–15.5)
WBC: 6.5 10*3/uL (ref 4.0–10.5)
nRBC: 0 % (ref 0.0–0.2)

## 2023-01-13 LAB — COMPREHENSIVE METABOLIC PANEL
ALT: 18 U/L (ref 0–44)
AST: 24 U/L (ref 15–41)
Albumin: 2.5 g/dL — ABNORMAL LOW (ref 3.5–5.0)
Alkaline Phosphatase: 76 U/L (ref 38–126)
Anion gap: 7 (ref 5–15)
BUN: 71 mg/dL — ABNORMAL HIGH (ref 8–23)
CO2: 22 mmol/L (ref 22–32)
Calcium: 7.9 mg/dL — ABNORMAL LOW (ref 8.9–10.3)
Chloride: 113 mmol/L — ABNORMAL HIGH (ref 98–111)
Creatinine, Ser: 2.1 mg/dL — ABNORMAL HIGH (ref 0.44–1.00)
GFR, Estimated: 25 mL/min — ABNORMAL LOW (ref >60)
Glucose, Bld: 162 mg/dL — ABNORMAL HIGH (ref 70–99)
Potassium: 2.8 mmol/L — ABNORMAL LOW (ref 3.5–5.1)
Sodium: 142 mmol/L (ref 135–145)
Total Bilirubin: 1 mg/dL (ref 0.3–1.2)
Total Protein: 4.7 g/dL — ABNORMAL LOW (ref 6.5–8.1)

## 2023-01-13 LAB — PROTIME-INR
INR: 2 — ABNORMAL HIGH (ref 0.8–1.2)
Prothrombin Time: 23.1 s — ABNORMAL HIGH (ref 11.4–15.2)

## 2023-01-13 LAB — GLUCOSE, CAPILLARY
Glucose-Capillary: 112 mg/dL — ABNORMAL HIGH (ref 70–99)
Glucose-Capillary: 159 mg/dL — ABNORMAL HIGH (ref 70–99)
Glucose-Capillary: 193 mg/dL — ABNORMAL HIGH (ref 70–99)
Glucose-Capillary: 211 mg/dL — ABNORMAL HIGH (ref 70–99)
Glucose-Capillary: 215 mg/dL — ABNORMAL HIGH (ref 70–99)
Glucose-Capillary: 245 mg/dL — ABNORMAL HIGH (ref 70–99)

## 2023-01-13 LAB — CYTOLOGY - NON PAP

## 2023-01-13 MED ORDER — POTASSIUM CHLORIDE CRYS ER 20 MEQ PO TBCR
40.0000 meq | EXTENDED_RELEASE_TABLET | ORAL | Status: AC
  Administered 2023-01-13 (×2): 40 meq via ORAL
  Filled 2023-01-13 (×2): qty 2

## 2023-01-13 MED ORDER — LACTULOSE 10 GM/15ML PO SOLN
10.0000 g | Freq: Two times a day (BID) | ORAL | Status: DC
  Administered 2023-01-13 – 2023-01-17 (×8): 10 g via ORAL
  Filled 2023-01-13 (×8): qty 30

## 2023-01-13 NOTE — Plan of Care (Signed)

## 2023-01-13 NOTE — Progress Notes (Signed)
PROGRESS NOTE   [name]  WUJ:[id number] DOB: 01/22/1951 DOA: 01/10/2023 PCP: Benita Stabile, MD   Chief Complaint  Patient presents with   Altered Mental Status   Level of care: Telemetry  Brief Admission History:  72 y.o. female with medical history significant for liver cirrhosis, diabetes mellitus, hypertension, CKD.  Patient was brought to the ED with reports of confusion, progressive abdominal and leg swelling, and vomiting, slow to respond.  Family is at bedside and provides the history.  At the time of my evaluation patient is awake, lethargic, not answering questions.  Family reports overnight patient had multiple episodes of vomiting, and has barely had any oral intake for the past couple of days.  No diarrhea.  She has never been on lactulose or rifaximin.  She is due for paracentesis 10/14, she gets this every 9 to 10 days.   ED Course: Temperature down to 94.3, improved with warming blanket.  Heart rate 69 to 90s.  Respirate rate 18-24.  Blood pressure systolic 102-130.  O2 sats greater than 95% on room air.  Ammonia 130.  WBC 7.  VBG shows pH of 7.4, pCO2 of 35.  Troponin 9 x 2.  UA not suggestive of UTI.  Head CT negative for acute abnormality.  Lactulose 30 g given in ED.  Hospitalist admit for acute hepatic encephalopathy.    Assessment and Plan:  Acute hepatic encephalopathy No prior history of hepatic encephalopathy.  Ammonia level elevated at 130.  Head CT negative for acute abnormality.  She also appears dehydrated.  Need to rule out SBP. -lactulose enemas ordered, fecal management system ordered  -IV ceftriaxone -ammonia down to 47  -mentation immediately improved after paracentesis on 10/14 5.5L cloudy ascitic fluid removed -lactulose ordered BID to maintain lower ammonia levels (pt says she had not been on this before) -01/13/23 -As per patient's son [name] mentation is better but not quite back to baseline  SBP  -Ascitic fluid cell count from paracentesis on  01/11/2023 greater than 11,000 -Creatinine and culture negative --IV ceftriaxone 2 g daily x 5 days started on 01/11/2023 - US guided paracentesis completed 10/14, followed by IV albumin 25 gm  --GI team recommends IV albumin 25 g every 6 hours x 4 doses on 01/14/2023  History of thrombocytopenia Platelets 58, at baseline.  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  Likely prerenal- multiple episodes of vomiting overnight, poor oral intake.  Also need to come consider hepatorenal considering- bilateral lower extremity edema and ascites. No significant urine output thus far,.  Prior history of urinary retention. 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  Nadolol and propranolol both on home med list. -Resumed home nadolol  -GI team recommends IV albumin 25 g every 6 hours x 4 doses on 01/14/2023 -Recommends Rocephin 2 g x 5 days for SBP prophylaxis started on 01/11/2023 -Lactulose reduced to 10 g twice daily-goal of 2-3 soft BM per day -Propranolol on hold -Continue rifaximin  Hypertension with renal disease BP stable, following  Type 2 diabetes mellitus with diabetic chronic kidney disease (HCC) Diabetes mellitus with hyperglycemia. .Use Novolog/Humalog Sliding scale insulin with Accu-Cheks/Fingersticks as ordered  -Held home NPH 12 units, and Novolin  DVT prophylaxis: SCDs Code Status: DNR  Family Communication: bedside update son 10/14, 10/15, 01/13/23 Disposition: anticipate home in 1-2 days   Consultants:  GI  Procedures:  Paracentesis 10/14, albumin IV given  Antimicrobials:  Ceftriaxone 2 gm IV 10/13>>   Subjective: -Patient's  son [name] is at bedside, questions answered -As per patient's son [name] mentation is better but not quite back to baseline  Objective: Vitals:   01/12/23 1315 01/12/23 2025 01/13/23 0434 01/13/23 1457  BP: (!) 142/57 (!) 135/45 (!) 120/44 (!) 136/42  Pulse: 61 (!) 52 (!) 59 (!) 49  Resp: 16 20 16 16   Temp: 98.4 F (36.9 C) 98.4 F (36.9 C) 98.4 F (36.9 C) (!) 97.5 F (36.4 C)  TempSrc: Oral Oral  Oral  SpO2: 98% 98% 99% 99%  Weight:      Height:        Intake/Output Summary (Last 24 hours) at 01/13/2023 1855 Last data filed at 01/13/2023 0442 Gross per 24 hour  Intake 378.66 ml  Output 900 ml  Net -521.34 ml   Filed Weights   01/10/23 1244 01/10/23 1808  Weight: 96.2 kg 96.2 kg   Examination:  General exam: More awake, more coherent, cooperative  respiratory system: Clear to auscultation. Respiratory effort normal. Cardiovascular system: normal S1 & S2 heard. No JVD, murmurs, rubs, gallops or clicks. No pedal edema. Gastrointestinal system: Abdomen is distended with ascites, tense to palpate. Normal bowel sounds heard. Central nervous system: obtunded. No focal neurological deficits. Extremities: moving extremities spontaneously. Skin: No rashes, lesions or ulcers. Psychiatry: Awake alert oriented x 3,  coherent, Rectum--Rectal tube with liquid stool  Data Reviewed: I have personally reviewed following labs and imaging studies  CBC: Recent Labs  Lab 01/07/23 1305 01/10/23 1309 01/11/23 0407 01/12/23 0424 01/13/23 0407  WBC 8.5 7.0 11.4* 6.2 6.5  NEUTROABS  --  6.4  --  4.9 4.7  HGB 10.4* 11.4* 9.4* 8.2* 8.7*  HCT 31.9* 35.8* 28.8* 25.3* 26.6*  MCV 101.3* 100.8* 101.1* 100.4* 101.5*  PLT 60* 58* 45* 32* 36*    Basic Metabolic Panel: Recent Labs  Lab 01/10/23 1309 01/11/23 0407 01/12/23 0424 01/13/23 0407  NA 135 140 141 142  K 3.2* 2.7* 2.9* 2.8*  CL 103 109 112* 113*  CO2 22 20* 20* 22  GLUCOSE 277* 189* 118* 162*  BUN 72* 77* 71* 71*   CREATININE 2.67* 2.77* 2.34* 2.10*  CALCIUM 8.1* 8.2* 7.7* 7.9*    CBG: Recent Labs  Lab 01/13/23 0010 01/13/23 0435 01/13/23 0747 01/13/23 1137 01/13/23 1656  GLUCAP 193* 159* 112* 245* 215*    Recent Results (from the past 240 hour(s))  Gram stain     Status: None   Collection Time: 01/11/23 11:45 AM   Specimen: Ascitic  Result Value Ref Range Status   Specimen Description ASCITIC  Final   Special Requests NONE  Final   Gram Stain   Final    WBC PRESENT, PREDOMINANTLY PMN NO ORGANISMS SEEN CYTOSPIN SMEAR Performed at [facility], [address], [place], [place] [address]    Report Status 01/11/2023 FINAL  Final  Culture, body fluid w Gram Stain-bottle     Status: None (Preliminary result)   Collection Time: 01/11/23 11:45 AM   Specimen: Peritoneal Washings  Result Value Ref Range Status   Specimen Description PERITONEAL ASCITIC  Final   Special Requests NONE  Final   Culture   Final    NO GROWTH 2 DAYS Performed at [facility], [address], [place], [place] [address]  Report Status PENDING  Incomplete    Scheduled Meds:  allopurinol  100 mg Oral QPM   insulin aspart  0-15 Units Subcutaneous Q4H   lactulose  10 g Oral BID   levothyroxine  125 mcg Oral Q0600   midodrine  10 mg Oral TID WC   nystatin  1 Application Topical TID   propranolol  20 mg Oral Daily   rifaximin  550 mg Oral BID   sodium bicarbonate  1,300 mg Oral BID   Continuous Infusions:  cefTRIAXone (ROCEPHIN)  IV Stopped (01/12/23 2316)    LOS: 3 days   Shon Hale, MD How to contact the [facility] Attending or Consulting provider 7A - 7P or covering provider during after hours 7P -7A, for this patient?  Check the care team in [facility] and look for a) attending/consulting TRH provider listed and b) the [facility] team listed Log into [email or web address] to find provider on call.  Locate the [facility] provider you are looking for under [facility] and page to a number that you can be directly reached. If  you still have difficulty reaching the provider, please page the [facility] (Director on Call) for the Hospitalists listed on amion for assistance.  01/13/2023, 6:55 PM

## 2023-01-13 NOTE — Plan of Care (Signed)

## 2023-01-13 NOTE — Progress Notes (Signed)
Patient having bowel movements, her mentation is much better today , she is alert and oriented x 4 and able to make needs known , peri-care provided to patient and barrier cream applied to perineal area, buttocks and labia for redness and protection. Sacral foam changed to stage II on buttock/sacral area, patient appetite fair this shift

## 2023-01-13 NOTE — Progress Notes (Signed)
Subjective: Feeling tired this morning. She states she had a lot of diarrhea overnight and has had some this morning as well. No abdomina pain but a lot of gurgling in her stomach. No nausea or vomiting. She is alert and oriented x4. Son at bedside states she seems much more coherent today.   Objective: Vital signs in last 24 hours: Temp:  [98.4 F (36.9 C)] 98.4 F (36.9 C) (10/16 0434) Pulse Rate:  [52-61] 59 (10/16 0434) Resp:  [16-20] 16 (10/16 0434) BP: (120-142)/(44-57) 120/44 (10/16 0434) SpO2:  [98 %-99 %] 99 % (10/16 0434) Last BM Date : 01/13/23 General:   Alert and oriented, pleasant Head:  Normocephalic and atraumatic. Eyes:  No icterus, sclera clear. Conjuctiva pink.  Heart:  S1, S2 present, no murmurs noted.  Lungs: Clear to auscultation bilaterally, without wheezing, rales, or rhonchi.  Abdomen:  Bowel sounds present, soft, non-tender, non-distended. No HSM or hernias noted. No rebound or guarding. No masses appreciated  Msk:  Symmetrical without gross deformities. Normal posture. Extremities:  mild edema present to LEs. Neurologic:  Alert and  oriented x4;  grossly normal neurologically. No asterixis  Skin:  Warm and dry, intact without significant lesions.  Psych:  Alert and cooperative. Normal mood and affect.  Intake/Output from previous day: 10/15 0701 - 10/16 0700 In: 668 [P.O.:480; IV Piggyback:188] Out: 1500 [Urine:1500] Intake/Output this shift: No intake/output data recorded.  Lab Results: Recent Labs    01/11/23 0407 01/12/23 0424 01/13/23 0407  WBC 11.4* 6.2 6.5  HGB 9.4* 8.2* 8.7*  HCT 28.8* 25.3* 26.6*  PLT 45* 32* 36*   BMET Recent Labs    01/11/23 0407 01/12/23 0424 01/13/23 0407  NA 140 141 142  K 2.7* 2.9* 2.8*  CL 109 112* 113*  CO2 20* 20* 22  GLUCOSE 189* 118* 162*  BUN 77* 71* 71*  CREATININE 2.77* 2.34* 2.10*  CALCIUM 8.2* 7.7* 7.9*   LFT Recent Labs    01/10/23 1309 01/12/23 0424 01/13/23 0407  PROT 6.0* 4.3*  4.7*  ALBUMIN 2.7* 2.2* 2.5*  AST 36 34 24  ALT 22 18 18   ALKPHOS 137* 74 76  BILITOT 2.0* 1.2 1.0   PT/INR Recent Labs    01/13/23 0407  LABPROT 23.1*  INR 2.0*    Studies/Results: US Paracentesis  Result Date: 01/11/2023 INDICATION: Abdominal ascites. EXAM: ULTRASOUND GUIDED  PARACENTESIS MEDICATIONS: None. COMPLICATIONS: None immediate. PROCEDURE: Informed written consent was obtained from the patient after a discussion of the risks, benefits and alternatives to treatment. A timeout was performed prior to the initiation of the procedure. Initial ultrasound scanning demonstrates a large amount of ascites within the left lower abdominal quadrant. The left lower abdomen was prepped and draped in the usual sterile fashion. 1% lidocaine was used for local anesthesia. Following this, a 19 gauge, 10-cm, Yueh catheter was introduced. Initial attempt with the patient in the left lateral decubitus position just below the umbilicus was unsuccessful. We then prepped more to the left and superior. An ultrasound image was saved for documentation purposes. The paracentesis was performed. The catheter was removed and a dressing was applied. The patient tolerated the procedure well without immediate post procedural complication. Patient received post-procedure intravenous albumin; see nursing notes for details. FINDINGS: A total of approximately 5.5 L of yellow fluid was removed. Samples were sent to the laboratory as requested by the clinical team. IMPRESSION: Successful ultrasound-guided paracentesis yielding 5.5 liters of peritoneal fluid. Electronically Signed   By: Marin Roberts  M.D.   On: 01/11/2023 13:33    Assessment: [name] is a 72 year old female with history of NASH cirrhosis, EVs s/p banding, ascites requiring paracentesis, GERD, DM, hypertension, HLD, CKD, chronic anemia, who was admitted to the hospital after presenting with worsening abdominal pain, vomiting and altered mental  status.   SBP: Paracentesis 10/14 yielding 5.5 L.  PMNs 10,737.  Gram stain negative.  Culture with no growth.  Started on IV Rocephin 2 g daily on 10/14 which will need to be continued x5 days. Given IV albumin yesterday and will need to be repeated again on day 3.   No previous SBP, will need to be on SBP prophylaxis moving forward, allergic to cipro and bactrim though cipro caused a yeast infection so not a true allergy, will likely start her on this given her renal function.  HE: family reported intermittent confusion for the last year, likely related to mild HE. On admission had acute worsening of symptoms likely driven by SBP. Clinically improved with lactulose, a/ox4 this morning without asterixis, though have some diarrhea now, will need to titrate lactulose to 2-3 soft BMs/day, continue wtih xifaxan  Cirrhosis: Complicated by EVs s/p multiple banding's previously maintained on nonselective beta-blocker, but this would need to be discontinued due to SBP.  Also with ascites requiring frequent paracentesis as diuretics are limited due to her CKD.  Also with coagulopathy in the setting of thrombocytopenia, plt count 36k, INR 2.  She follows with Dr. Loreta Ave in [place].  She was to undergo surveillance EGD earlier this year, but it has not yet been completed. MELD 3.0 today is 21  Anemia: Multifactorial in setting of IDA, anemia of chronic disease in setting of cirrhosis with portal hypertensive gastropathy and CKD, following with hematology outpatient.  She has received IV iron and started on Retacrit in July.  No overt GI bleeding.  Hemoglobin was 11.4 on day of admission. Hgb 8.7 today, could be some aspect of hemodilution as she has received IV fluids and albumin.  She should follow with her primary GI in outpatient setting for surveillance EGD and colonoscopy as previously recommended.  No indication for inpatient endoscopic procedures at this time.  Plan: Repeat albumin on 10/17-25g Q6h x4  doses Continue rocephing 2g x5 days (started evening of 10/14) Continue lactulose, titrate to 2-3 soft BMs per day  Continue xifaxan 550mg  BID Hold propranolol in setting of SBP MELD labs daily Trned h&H, monitor for overt GI bleeding Ongoing SBP prophylaxis, likely with cipro(allergy listed but not a true allergy) Outpatient EGD/Colonoscopy with Dr. Loreta Ave    LOS: 3 days    01/13/2023, 9:00 AM   Lenya Sterne L. Jeanmarie Hubert, MSN, APRN, AGNP-C Adult-Gerontology Nurse Practitioner [facility] at [facility]

## 2023-01-14 ENCOUNTER — Other Ambulatory Visit: Payer: Self-pay

## 2023-01-14 DIAGNOSIS — K7682 Hepatic encephalopathy: Secondary | ICD-10-CM

## 2023-01-14 DIAGNOSIS — K7581 Nonalcoholic steatohepatitis (NASH): Secondary | ICD-10-CM | POA: Diagnosis not present

## 2023-01-14 DIAGNOSIS — K7460 Unspecified cirrhosis of liver: Secondary | ICD-10-CM | POA: Diagnosis not present

## 2023-01-14 DIAGNOSIS — K652 Spontaneous bacterial peritonitis: Secondary | ICD-10-CM

## 2023-01-14 LAB — CBC WITH DIFFERENTIAL/PLATELET
Abs Immature Granulocytes: 0.03 10*3/uL (ref 0.00–0.07)
Basophils Absolute: 0 10*3/uL (ref 0.0–0.1)
Basophils Relative: 1 %
Eosinophils Absolute: 0.4 10*3/uL (ref 0.0–0.5)
Eosinophils Relative: 5 %
HCT: 28.8 % — ABNORMAL LOW (ref 36.0–46.0)
Hemoglobin: 9.1 g/dL — ABNORMAL LOW (ref 12.0–15.0)
Immature Granulocytes: 0 %
Lymphocytes Relative: 14 %
Lymphs Abs: 1.1 10*3/uL (ref 0.7–4.0)
MCH: 32.5 pg (ref 26.0–34.0)
MCHC: 31.6 g/dL (ref 30.0–36.0)
MCV: 102.9 fL — ABNORMAL HIGH (ref 80.0–100.0)
Monocytes Absolute: 0.7 10*3/uL (ref 0.1–1.0)
Monocytes Relative: 9 %
Neutro Abs: 5.5 10*3/uL (ref 1.7–7.7)
Neutrophils Relative %: 71 %
Platelets: 42 10*3/uL — ABNORMAL LOW (ref 150–400)
RBC: 2.8 MIL/uL — ABNORMAL LOW (ref 3.87–5.11)
RDW: 16.1 % — ABNORMAL HIGH (ref 11.5–15.5)
WBC: 7.8 10*3/uL (ref 4.0–10.5)
nRBC: 0 % (ref 0.0–0.2)

## 2023-01-14 LAB — RENAL FUNCTION PANEL
Albumin: 2.6 g/dL — ABNORMAL LOW (ref 3.5–5.0)
Anion gap: 9 (ref 5–15)
BUN: 66 mg/dL — ABNORMAL HIGH (ref 8–23)
CO2: 21 mmol/L — ABNORMAL LOW (ref 22–32)
Calcium: 7.8 mg/dL — ABNORMAL LOW (ref 8.9–10.3)
Chloride: 110 mmol/L (ref 98–111)
Creatinine, Ser: 1.92 mg/dL — ABNORMAL HIGH (ref 0.44–1.00)
GFR, Estimated: 28 mL/min — ABNORMAL LOW (ref >60)
Glucose, Bld: 85 mg/dL (ref 70–99)
Phosphorus: 2.5 mg/dL (ref 2.5–4.6)
Potassium: 3.4 mmol/L — ABNORMAL LOW (ref 3.5–5.1)
Sodium: 140 mmol/L (ref 135–145)

## 2023-01-14 LAB — GLUCOSE, CAPILLARY
Glucose-Capillary: 128 mg/dL — ABNORMAL HIGH (ref 70–99)
Glucose-Capillary: 136 mg/dL — ABNORMAL HIGH (ref 70–99)
Glucose-Capillary: 138 mg/dL — ABNORMAL HIGH (ref 70–99)
Glucose-Capillary: 178 mg/dL — ABNORMAL HIGH (ref 70–99)
Glucose-Capillary: 225 mg/dL — ABNORMAL HIGH (ref 70–99)
Glucose-Capillary: 280 mg/dL — ABNORMAL HIGH (ref 70–99)
Glucose-Capillary: 79 mg/dL (ref 70–99)

## 2023-01-14 LAB — PH, BODY FLUID: pH, Body Fluid: 7

## 2023-01-14 LAB — MAGNESIUM: Magnesium: 2 mg/dL (ref 1.7–2.4)

## 2023-01-14 MED ORDER — ALBUMIN HUMAN 25 % IV SOLN
25.0000 g | Freq: Four times a day (QID) | INTRAVENOUS | Status: AC
  Administered 2023-01-14 – 2023-01-15 (×4): 25 g via INTRAVENOUS
  Filled 2023-01-14 (×4): qty 100

## 2023-01-14 MED ORDER — K PHOS MONO-SOD PHOS DI & MONO 155-852-130 MG PO TABS
250.0000 mg | ORAL_TABLET | Freq: Three times a day (TID) | ORAL | Status: AC
  Administered 2023-01-14 – 2023-01-15 (×4): 250 mg via ORAL
  Filled 2023-01-14 (×4): qty 1

## 2023-01-14 MED ORDER — POTASSIUM CHLORIDE CRYS ER 20 MEQ PO TBCR
40.0000 meq | EXTENDED_RELEASE_TABLET | ORAL | Status: AC
  Administered 2023-01-14 (×2): 40 meq via ORAL
  Filled 2023-01-14: qty 2

## 2023-01-14 NOTE — TOC Progression Note (Addendum)
Transition of Care [facility]) - Progression Note    Patient Details  Name: [name] MRN: [id number] Date of Birth: 1950/05/03  [facility]) CM/SW Contact  Karn Cassis, [place] Phone Number: 01/14/2023, 12:39 PM  Clinical Narrative: PT evaluated pt and recommend SNF. LCSW discussed with pt and daughter who are agreeable. Reviewed Medicare.gov ratings. Daughter requests [facility] or [facility] SNF. Will initiate bed search. Morrie Sheldon with [facility] updated on plan. CMA starting authorization as pt is [facility] Medicare primary.    Expected Discharge Plan: Home w Home Health Services Barriers to Discharge: Continued Medical Work up  Expected Discharge Plan and Services In-house Referral: Clinical Social Work   Post Acute Care Choice: Home Health Living arrangements for the past 2 months: Single Family Home                           HH Arranged: PT, OT, Nurse's Aide HH Agency: [facility] Date HH Agency Contacted: 01/11/23 Time HH Agency Contacted: [phone] Representative spoke with at [facility] Agency: Morrie Sheldon   Social Determinants of Health (SDOH) Interventions SDOH Screenings   Food Insecurity: No Food Insecurity (01/10/2023)  Housing: Low Risk  (01/10/2023)  Transportation Needs: No Transportation Needs (01/10/2023)  Utilities: Not At Risk (01/10/2023)  Alcohol Screen: Low Risk  (10/30/2019)  Depression (PHQ2-9): Medium Risk (10/30/2019)  Financial Resource Strain: Medium Risk (10/30/2019)  Physical Activity: Inactive (10/30/2019)  Social Connections: Moderately Integrated (10/30/2019)  Stress: No Stress Concern Present (10/30/2019)  Tobacco Use: Low Risk  (01/11/2023)    Readmission Risk Interventions    01/11/2023    8:51 AM 09/04/2022   12:38 PM  Readmission Risk Prevention Plan  Transportation Screening Complete Complete  HRI or Home Care Consult Complete Complete  Social Work Consult for Recovery Care Planning/Counseling Complete Complete  Palliative Care  Screening Not Applicable Complete  Medication Review Oceanographer) Complete Complete

## 2023-01-14 NOTE — Evaluation (Signed)
Physical Therapy Evaluation Patient Details Name: KHADAJAH EACHUS MRN: [id number] DOB: 09-Apr-1950 Today's Date: 01/14/2023  History of Present Illness  ASHLEYNICOLE SYLVIA is a 72 y.o. female with medical history significant for liver cirrhosis, diabetes mellitus, hypertension, CKD.  Patient was brought to the ED with reports of confusion, progressive abdominal and leg swelling, and vomiting, slow to respond.  Family is at bedside and provides the history.  At the time of my evaluation patient is awake, lethargic, not answering questions.  Family reports overnight patient had multiple episodes of vomiting, and has barely had any oral intake for the past couple of days.  No diarrhea.  She has never been on lactulose or rifaximin.  She is due for paracentesis tomorrow, she gets this every 9 to 10 days.   Clinical Impression  Patient demonstrates slow labored movement for sitting up at bedside having mostl difficulty propping up on elbows to hands, once seated c/o pain over buttocks possibly due to mild maceration of skin from frequent bowel movements (using flexiseal), had difficulty completing sit to stands due to BLE weakness and limited to a few steps at bedside due to fatigue and generalized weakness.  Patient tolerated sitting up in chair after therapy with her son present.  Patient will benefit from continued skilled physical therapy in hospital and recommended venue below to increase strength, balance, endurance for safe ADLs and gait.          If plan is discharge home, recommend the following: A lot of help with bathing/dressing/bathroom;A lot of help with walking and/or transfers;Help with stairs or ramp for entrance;Assistance with cooking/housework   Can travel by private vehicle   No    Equipment Recommendations None recommended by PT  Recommendations for Other Services       Functional Status Assessment Patient has had a recent decline in their functional status and demonstrates the  ability to make significant improvements in function in a reasonable and predictable amount of time.     Precautions / Restrictions Precautions Precautions: Fall Restrictions Weight Bearing Restrictions: No      Mobility  Bed Mobility Overal bed mobility: Needs Assistance Bed Mobility: Supine to Sit     Supine to sit: Mod assist     General bed mobility comments: slow labored movement    Transfers Overall transfer level: Needs assistance Equipment used: Rolling walker (2 wheels) Transfers: Sit to/from Stand, Bed to chair/wheelchair/BSC Sit to Stand: Min assist, Mod assist   Step pivot transfers: Mod assist       General transfer comment: unsteady  labored movement, with c/o buttock pain when sitting in chair    Ambulation/Gait Ambulation/Gait assistance: Min assist, Mod assist Gait Distance (Feet): 10 Feet Assistive device: Rolling walker (2 wheels) Gait Pattern/deviations: Decreased step length - right, Decreased step length - left, Decreased stride length, Trunk flexed Gait velocity: slow     General Gait Details: limited to a few steps forward/backwards at bedside mostly due to c/o fatigue and incontinent of stool  Stairs            Wheelchair Mobility     Tilt Bed    Modified Rankin (Stroke Patients Only)       Balance Overall balance assessment: Needs assistance Sitting-balance support: Feet supported, No upper extremity supported Sitting balance-Leahy Scale: Fair Sitting balance - Comments: seated at EOB   Standing balance support: Reliant on assistive device for balance, Bilateral upper extremity supported, During functional activity Standing balance-Leahy Scale: Poor Standing balance  comment: fair/poor using RW                             Pertinent Vitals/Pain Pain Assessment Pain Assessment: 0-10 Pain Score: 7  Pain Location: left lower stomach, over buttocks Pain Descriptors / Indicators: Sore, Discomfort, Sharp Pain  Intervention(s): Limited activity within patient's tolerance, Monitored during session, Repositioned    Home Living Family/patient expects to be discharged to:: Private residence Living Arrangements: Children Available Help at Discharge: Family;Available PRN/intermittently Type of Home: Mobile home Home Access: Stairs to enter;Ramped entrance       Home Layout: One level Home Equipment: Agricultural consultant (2 wheels);Rollator (4 wheels);BSC/3in1;Shower seat      Prior Function Prior Level of Function : Needs assist       Physical Assist : Mobility (physical);ADLs (physical) Mobility (physical): Bed mobility;Transfers;Gait;Stairs   Mobility Comments: household and short distanced Orthoptist ADLs Comments: Assisted by family     Extremity/Trunk Assessment   Upper Extremity Assessment Upper Extremity Assessment: Generalized weakness    Lower Extremity Assessment Lower Extremity Assessment: Generalized weakness    Cervical / Trunk Assessment Cervical / Trunk Assessment: Kyphotic  Communication   Communication Communication: No apparent difficulties  Cognition Arousal: Alert Behavior During Therapy: WFL for tasks assessed/performed Overall Cognitive Status: Within Functional Limits for tasks assessed                                          General Comments      Exercises     Assessment/Plan    PT Assessment Patient needs continued PT services  PT Problem List Decreased strength;Decreased activity tolerance;Decreased balance;Decreased mobility       PT Treatment Interventions DME instruction;Gait training;Stair training;Functional mobility training;Therapeutic activities;Therapeutic exercise;Balance training;Patient/family education    PT Goals (Current goals can be found in the Care Plan section)  Acute Rehab PT Goals Patient Stated Goal: return home with family to assist PT Goal Formulation: With patient/family Time  For Goal Achievement: 01/21/23 Potential to Achieve Goals: Good    Frequency Min 3X/week     Co-evaluation               AM-PAC PT "6 Clicks" Mobility  Outcome Measure Help needed turning from your back to your side while in a flat bed without using bedrails?: A Little Help needed moving from lying on your back to sitting on the side of a flat bed without using bedrails?: A Lot Help needed moving to and from a bed to a chair (including a wheelchair)?: A Little Help needed standing up from a chair using your arms (e.g., wheelchair or bedside chair)?: A Lot Help needed to walk in hospital room?: A Lot Help needed climbing 3-5 steps with a railing? : A Lot 6 Click Score: 14    End of Session   Activity Tolerance: Patient tolerated treatment well;Patient limited by fatigue Patient left: in chair;with call bell/phone within reach;with family/visitor present Nurse Communication: Mobility status PT Visit Diagnosis: Unsteadiness on feet (R26.81);Other abnormalities of gait and mobility (R26.89);Muscle weakness (generalized) (M62.81)    Time: 4696-2952 PT Time Calculation (min) (ACUTE ONLY): 31 min   Charges:   PT Evaluation $PT Eval Moderate Complexity: 1 Mod PT Treatments $Therapeutic Activity: 23-37 mins PT General Charges $$ ACUTE PT VISIT: 1 Visit  3:02 PM, 01/14/23 Ocie Bob, MPT Physical Therapist with [facility] [phone] office [phone] mobile phone

## 2023-01-14 NOTE — Progress Notes (Signed)
Gastroenterology Progress Note   Referring Provider: No ref. provider found Primary Care Physician:  Benita Stabile, MD Patient ID: [name]; [id number]; 11-27-1950   Subjective:    Feeling better. Good appetite. No abdominal pain except for where she gets insulin shots. Alert and oriented.3-4 stools in the past 24 hours. No melena, brbpr.  Objective:   Vital signs in last 24 hours: Temp:  [97.5 F (36.4 C)-97.8 F (36.6 C)] 97.6 F (36.4 C) (10/17 1316) Pulse Rate:  [48-56] 48 (10/17 1316) Resp:  [16-20] 18 (10/17 1316) BP: (104-141)/(42-74) 104/57 (10/17 1316) SpO2:  [99 %-100 %] 99 % (10/17 1316) Last BM Date : 01/14/23 General:   Alert,  Well-developed, well-nourished, pleasant and cooperative in NAD Head:  Normocephalic and atraumatic. Eyes:  Sclera clear, no icterus.   Abdomen:  Soft, nontender, distended but soft.Normal bowel sounds, without guarding, and without rebound.   Extremities:  Without clubbing, deformity or edema. Neurologic:  Alert and  oriented x4;  grossly normal neurologically. No asterixis Psych:  Alert and cooperative. Normal mood and affect.  Intake/Output from previous day: 10/16 0701 - 10/17 0700 In: 820 [P.O.:720; IV Piggyback:100] Out: 1150 [Urine:450; Stool:700] Intake/Output this shift: Total I/O In: 240 [P.O.:240] Out: -   Lab Results: CBC Recent Labs    01/12/23 0424 01/13/23 0407 01/14/23 0448  WBC 6.2 6.5 7.8  HGB 8.2* 8.7* 9.1*  HCT 25.3* 26.6* 28.8*  MCV 100.4* 101.5* 102.9*  PLT 32* 36* 42*   BMET Recent Labs    01/12/23 0424 01/13/23 0407 01/14/23 0448  NA 141 142 140  K 2.9* 2.8* 3.4*  CL 112* 113* 110  CO2 20* 22 21*  GLUCOSE 118* 162* 85  BUN 71* 71* 66*  CREATININE 2.34* 2.10* 1.92*  CALCIUM 7.7* 7.9* 7.8*   LFTs Recent Labs    01/12/23 0424 01/13/23 0407 01/14/23 0448  BILITOT 1.2 1.0  --   ALKPHOS 74 76  --   AST 34 24  --   ALT 18 18  --   PROT 4.3* 4.7*  --   ALBUMIN 2.2* 2.5* 2.6*   No  results for input(s): "LIPASE" in the last 72 hours. PT/INR Recent Labs    01/13/23 0407  LABPROT 23.1*  INR 2.0*         Imaging Studies: US Paracentesis  Result Date: 01/11/2023 INDICATION: Abdominal ascites. EXAM: ULTRASOUND GUIDED  PARACENTESIS MEDICATIONS: None. COMPLICATIONS: None immediate. PROCEDURE: Informed written consent was obtained from the patient after a discussion of the risks, benefits and alternatives to treatment. A timeout was performed prior to the initiation of the procedure. Initial ultrasound scanning demonstrates a large amount of ascites within the left lower abdominal quadrant. The left lower abdomen was prepped and draped in the usual sterile fashion. 1% lidocaine was used for local anesthesia. Following this, a 19 gauge, 10-cm, Yueh catheter was introduced. Initial attempt with the patient in the left lateral decubitus position just below the umbilicus was unsuccessful. We then prepped more to the left and superior. An ultrasound image was saved for documentation purposes. The paracentesis was performed. The catheter was removed and a dressing was applied. The patient tolerated the procedure well without immediate post procedural complication. Patient received post-procedure intravenous albumin; see nursing notes for details. FINDINGS: A total of approximately 5.5 L of yellow fluid was removed. Samples were sent to the laboratory as requested by the clinical team. IMPRESSION: Successful ultrasound-guided paracentesis yielding 5.5 liters of peritoneal fluid. Electronically Signed  By: Marin Roberts M.D.   On: 01/11/2023 13:33   CT Head Wo Contrast  Result Date: 01/10/2023 CLINICAL DATA:  Delirium EXAM: CT HEAD WITHOUT CONTRAST TECHNIQUE: Contiguous axial images were obtained from the base of the skull through the vertex without intravenous contrast. RADIATION DOSE REDUCTION: This exam was performed according to the departmental dose-optimization program which  includes automated exposure control, adjustment of the mA and/or kV according to patient size and/or use of iterative reconstruction technique. COMPARISON:  CT head 08/25/22 FINDINGS: Brain: No hemorrhage. No hydrocephalus. No extra-axial fluid collection. No CT evidence of an acute cortical infarct. No mass effect. No mass lesion. Vascular: No hyperdense vessel or unexpected calcification. Skull: Normal. Negative for fracture or focal lesion. Sinuses/Orbits: No middle ear or mastoid effusion. Paranasal sinuses are clear. Right lens replacement. Orbits are otherwise unremarkable. Other: None. IMPRESSION: No acute intracranial abnormality. Electronically Signed   By: Lorenza Cambridge M.D.   On: 01/10/2023 16:06   US Paracentesis  Result Date: 01/01/2023 INDICATION: Ascites. EXAM: ULTRASOUND GUIDED therapeutic PARACENTESIS MEDICATIONS: None. COMPLICATIONS: None immediate. PROCEDURE: Informed written consent was obtained from the patient after a discussion of the risks, benefits and alternatives to treatment. A timeout was performed prior to the initiation of the procedure. Initial ultrasound scanning demonstrates a moderate amount of ascites within the left lower abdominal quadrant. The right lower abdomen was prepped and draped in the usual sterile fashion. 1% lidocaine was used for local anesthesia. Following this, a paracentesis catheter was introduced. An ultrasound image was saved for documentation purposes. The paracentesis was performed. The catheter was removed and a dressing was applied. The patient tolerated the procedure well without immediate post procedural complication. FINDINGS: A total of approximately 4 L of serous fluid was removed. IMPRESSION: Successful ultrasound-guided paracentesis yielding 4 liters of peritoneal fluid. Electronically Signed   By: Lupita Raider M.D.   On: 01/01/2023 11:10   US Paracentesis  Result Date: 12/23/2022 INDICATION: Patient with history of NASH cirrhosis,  recurrent ascites. Request for diagnostic and therapeutic paracentesis. EXAM: ULTRASOUND GUIDED DIAGNOSTIC AND THERAPEUTIC PARACENTESIS MEDICATIONS: 7 mL 1% lidocaine. COMPLICATIONS: None immediate. PROCEDURE: Informed written consent was obtained from the patient after a discussion of the risks, benefits and alternatives to treatment. A timeout was performed prior to the initiation of the procedure. Initial ultrasound scanning demonstrates a large amount of ascites within the left lower abdominal quadrant. The left lower abdomen was prepped and draped in the usual sterile fashion. 1% lidocaine was used for local anesthesia. Following this, a 19 gauge, 10-cm, Yueh catheter was introduced. An ultrasound image was saved for documentation purposes. The paracentesis was performed. The catheter was removed and a dressing was applied. The patient tolerated the procedure well without immediate post procedural complication. Patient received post-procedure intravenous albumin; see nursing notes for details. FINDINGS: A total of approximately 4.0 L of clear yellow fluid was removed. Samples were sent to the laboratory as requested by the clinical team. IMPRESSION: Successful ultrasound-guided paracentesis yielding 4.0 liters of peritoneal fluid. Performed by Lynnette Caffey, PA-C PLAN: The patient has required >/=2 paracenteses in a 30 day period and a formal evaluation by the [facility] has been arranged. Electronically Signed   By: Acquanetta Belling M.D.   On: 12/23/2022 11:48  [2 weeks]  Assessment:    Lakeesha Campolo is a 72 year old female with history of NASH cirrhosis, EVs s/p banding, ascites requiring paracentesis, GERD, DM, hypertension, HLD, CKD, chronic anemia,  who was admitted to the hospital after presenting with worsening abdominal pain, vomiting and altered mental status.    SBP: -10/14 para with 5.5 liters. PMNs 10,737. Culture with no growth -IV  Rocephin 2g daily for 5 days. -Given IV albumin 10/15 and for repeat today -no prior SBP -will need SBP prophylaxis moving forward, she refuses Bactrim due to prior reaction leading to hospitalization. She has used cipro without any significant reactions in more recent years. Will need to be careful due to renal insufficency.  HE: -suspect mild HE intermittent this past one year -likely worsened in setting of SBP -titrate lactulose to 2-3 soft stools daily -continue xofaxan 550mg  TID  Decompensated cirrhosis: -SBP and HE as outlined above -esophageal varices s/p banding's previously maintained on nonselective beta-blocker, will need to be discontinued in the setting of SBP -Frequent paracenteses has been required due to diuretics limited in setting of CKD -Thrombocytopenia with platelets of 36,000 -MELD 3.0, 21  Anemia: Multifactorial in the setting of IDA, anemia of chronic disease following with hematology. -Baseline appears to be in the 9-10 range, initially some decline in the setting of hemodilution, currently stable at 9.1. -She should continue to follow-up with primary GI in the outpatient setting for surveillance EGD and colonoscopy as previously recommended.  No indication for inpatient endoscopic procedures at this time.  Plan:   Albumin today, 100mg  total. Complete Rocephin 2g daily for five days. Hold propranolol in the setting of SBP Continue lactulose, titrate to 3 soft stools daily Continue Xifaxan 550 mg twice daily. She will need ongoing SBP prophylaxis, likely Cipro Outpatient follow-up with Dr. Loreta Ave for EGD and colonoscopy as previously recommended. Continue 2 gram sodium diet.   LOS: 4 days   Leanna Battles. [facility] [phone] 10/17/20241:35 PM

## 2023-01-14 NOTE — Plan of Care (Signed)

## 2023-01-14 NOTE — NC FL2 (Signed)
[place] MEDICAID [id number] LEVEL OF CARE FORM     IDENTIFICATION  Patient Name: [name] Birthdate: 03/08/1951 Sex: female Admission Date (Current Location): 01/10/2023  [facility] and [place] Number:  [employer] and Address:  [facility],  [address], [place] [address]      Provider Number: [id number]  Attending Physician Name and Address:  Shon Hale, MD  Relative Name and Phone Number:       Current Level of Care: Hospital Recommended Level of Care: Skilled Nursing Facility Prior Approval Number:    Date Approved/Denied:   PASRR Number: [id number]  Discharge Plan: SNF    Current Diagnoses: Patient Active Problem List   Diagnosis Date Noted   Acute hepatic encephalopathy (HCC) 01/10/2023   Ascites 01/10/2023   Anemia in stage 4 chronic kidney disease (HCC) 10/15/2022   Complete prolapse of vaginal vault 09/15/2022   Urinary retention 09/15/2022   Vaginal pessary present 09/15/2022   Elevated brain natriuretic peptide (BNP) level 09/03/2022   Hypoalbuminemia due to protein-calorie malnutrition (HCC) 09/03/2022   Prolonged QT interval 08/25/2022   Vaginal prolapse 08/25/2022   Acute kidney injury superimposed on stage 4 chronic kidney disease (HCC) 08/25/2022   Obesity 06/16/2022   Cirrhosis, non-alcoholic (HCC) 12/04/2020   Vitamin D deficiency 11/26/2020   Mixed hyperlipidemia 11/26/2020   Gout 11/26/2020   History of thrombocytopenia 11/26/2020   Thrombocytopenia (HCC) 07/30/2017   CKD stage 3 due to type 2 diabetes mellitus (HCC) 06/30/2017   Liver cirrhosis secondary to NASH (HCC) 06/30/2017   Hypertension with renal disease 03/19/2017   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/19/2017   Tremor 08/06/2016   Vitreous hemorrhage of left eye (HCC) 01/29/2015   Diabetic macular edema (HCC) 12/25/2014   Hypertensive retinopathy of both eyes 12/25/2014   Proliferative diabetic retinopathy (HCC) 08/04/2011    Bilateral nondiabetic proliferative retinopathy 04/03/2011   Type 2 diabetes mellitus with diabetic chronic kidney disease (HCC) 02/05/2010   Acquired hypothyroidism 03/20/2008   RENAL DISEASE, CHRONIC, STAGE II 03/20/2008   Hereditary and idiopathic peripheral neuropathy 02/21/2008   Pseudophakia of both eyes 02/21/2008   GERD 02/21/2008    Orientation RESPIRATION BLADDER Height & Weight     Self, Time, Situation, Place  Normal Incontinent Weight: 212 lb 1.3 oz (96.2 kg) Height:  5\' 10"  (177.8 cm)  BEHAVIORAL SYMPTOMS/MOOD NEUROLOGICAL BOWEL NUTRITION STATUS      Incontinent Diet (See d/c summary)  AMBULATORY STATUS COMMUNICATION OF NEEDS Skin   Extensive Assist Verbally Bruising, Other (Comment) (Redness to abdomen and groin. Stage II to right buttocks with foam dressing)                       Personal Care Assistance Level of Assistance  Bathing, Feeding, Dressing Bathing Assistance: Maximum assistance Feeding assistance: Limited assistance Dressing Assistance: Maximum assistance     Functional Limitations Info  Sight, Hearing, Speech Sight Info: Impaired Hearing Info: Impaired Speech Info: Adequate    SPECIAL CARE FACTORS FREQUENCY  PT (By licensed PT)     PT Frequency: 5x weekly              Contractures      Additional Factors Info  Code Status, Allergies Code Status Info: DNR- pre-arrest interventions desired Allergies Info: Bactrim (Sulfamethoxazole-trimethoprim), Cipro (Ciprofloxacin Hcl), Codeine, Cozaar (Losartan Potassium), Lipitor (Atorvastatin), Mevacor (Lovastatin), Pravachol (Pravastatin), Rosuvastatin, Statins, Welchol (Colesevelam), Zestril (Lisinopril), Zetia (Ezetimibe), Dilaudid (Hydromorphone)  Current Medications (01/14/2023):  This is the current hospital active medication list Current Facility-Administered Medications  Medication Dose Route Frequency Provider Last Rate Last Admin   acetaminophen (TYLENOL) tablet 650 mg   650 mg Oral Q6H PRN Emokpae, Ejiroghene E, MD       Or   acetaminophen (TYLENOL) suppository 650 mg  650 mg Rectal Q6H PRN Emokpae, Ejiroghene E, MD       allopurinol (ZYLOPRIM) tablet 100 mg  100 mg Oral QPM Johnson, Clanford L, MD   100 mg at 01/13/23 1740   cefTRIAXone (ROCEPHIN) 2 g in sodium chloride 0.9 % 100 mL IVPB  2 g Intravenous Q24H Johnson, Clanford L, MD 200 mL/hr at 01/13/23 2208 2 g at 01/13/23 2208   insulin aspart (novoLOG) injection 0-15 Units  0-15 Units Subcutaneous Q4H Emokpae, Ejiroghene E, MD   2 Units at 01/14/23 0810   lactulose (CHRONULAC) 10 GM/15ML solution 10 g  10 g Oral BID Shon Hale, MD   10 g at 01/13/23 2208   levothyroxine (SYNTHROID) tablet 125 mcg  125 mcg Oral Q0600 Emokpae, Ejiroghene E, MD   125 mcg at 01/14/23 0635   midodrine (PROAMATINE) tablet 10 mg  10 mg Oral TID WC Zierle-Ghosh, Asia B, DO   10 mg at 01/14/23 0804   nystatin (MYCOSTATIN/NYSTOP) topical powder 1 Application  1 Application Topical TID Standley Dakins L, MD   1 Application at 01/13/23 2208   phosphorus (K PHOS NEUTRAL) tablet 250 mg  250 mg Oral TID Emokpae, Courage, MD       potassium chloride SA (KLOR-CON M) CR tablet 40 mEq  40 mEq Oral Q3H Emokpae, Courage, MD       promethazine (PHENERGAN) tablet 12.5 mg  12.5 mg Oral Q6H PRN Emokpae, Ejiroghene E, MD       propranolol (INDERAL) tablet 20 mg  20 mg Oral Daily Johnson, Clanford L, MD   20 mg at 01/14/23 0819   rifaximin (XIFAXAN) tablet 550 mg  550 mg Oral BID Ermalinda Memos S, PA-C   550 mg at 01/14/23 0818   sodium bicarbonate tablet 1,300 mg  1,300 mg Oral BID Emokpae, Ejiroghene E, MD   1,300 mg at 01/14/23 0818     Discharge Medications: Please see discharge summary for a list of discharge medications.  Relevant Imaging Results:  Relevant Lab Results:   Additional Information SSN: [id number]  Karn Cassis, LCSW

## 2023-01-14 NOTE — Plan of Care (Signed)
  Problem: Acute Rehab PT Goals(only PT should resolve) Goal: Pt Will Go Supine/Side To Sit Outcome: Progressing Flowsheets (Taken 01/14/2023 1504) Pt will go Supine/Side to Sit:  with minimal assist  with moderate assist Goal: Patient Will Transfer Sit To/From Stand Outcome: Progressing Flowsheets (Taken 01/14/2023 1504) Patient will transfer sit to/from stand: with minimal assist Goal: Pt Will Transfer Bed To Chair/Chair To Bed Outcome: Progressing Flowsheets (Taken 01/14/2023 1504) Pt will Transfer Bed to Chair/Chair to Bed: with min assist Goal: Pt Will Ambulate Outcome: Progressing Flowsheets (Taken 01/14/2023 1504) Pt will Ambulate:  50 feet  with minimal assist  with rolling walker   3:05 PM, 01/14/23 Ocie Bob, MPT Physical Therapist with [facility] [phone] office [phone] mobile phone

## 2023-01-14 NOTE — Progress Notes (Signed)
PROGRESS NOTE  [name]  JWJ:[id number] DOB: [birth year] DOA: 01/10/2023 PCP: Benita Stabile, MD   Chief Complaint  Patient presents with   Altered Mental Status   Level of care: Telemetry  Brief Admission History:  72 y.o. female with medical history significant for liver cirrhosis, diabetes mellitus, hypertension, CKD.  Patient was brought to the ED with reports of confusion, progressive abdominal and leg swelling, and vomiting, slow to respond.  Family is at bedside and provides the history.  At the time of my evaluation patient is awake, lethargic, not answering questions.  Family reports overnight patient had multiple episodes of vomiting, and has barely had any oral intake for the past couple of days.  No diarrhea.  She has never been on lactulose or rifaximin.  She is due for paracentesis 10/14, she gets this every 9 to 10 days.   ED Course: Temperature down to 94.3, improved with warming blanket.  Heart rate 69 to 90s.  Respirate rate 18-24.  Blood pressure systolic 102-130.  O2 sats greater than 95% on room air.  Ammonia 130.  WBC 7.  VBG shows pH of 7.4, pCO2 of 35.  Troponin 9 x 2.  UA not suggestive of UTI.  Head CT negative for acute abnormality.  Lactulose 30 g given in ED.  Hospitalist admit for acute hepatic encephalopathy.    Assessment and Plan: Acute hepatic encephalopathy No prior history of hepatic encephalopathy.  Ammonia level elevated at 130.  Head CT negative for acute abnormality.  She also appears dehydrated.  Need to rule out SBP. -Improved with lactulose enema transition to oral lactulose -IV ceftriaxone x 5 days for SBP -ammonia down to 47  -Status post paracentesis on 01/11/2023 with 5.5L cloudy ascitic fluid removed -lactulose ordered BID to maintain lower ammonia levels (pt says she had not been on this before) -01/14/23 -As per patient's son [name] mentation is pretty much close to baseline at this time -Lactulose recently adjusted -Rectal tube  removed  2)SBP ---Ascitic fluid cell count from paracentesis on 01/11/2023 greater than 11,000 -Creatinine and culture negative --IV ceftriaxone 2 g daily x 5 days started on 01/11/2023 - US guided paracentesis completed 10/14, followed by IV albumin 25 gm  --GI team recommends IV albumin --patient received IV albumin on 01/12/2023 and again on 01/14/2023  (25 g every 6 hours x 4 doses)  3)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  Likely prerenal- multiple episodes of vomiting overnight, poor oral intake.  Also need to come consider hepatorenal considering- bilateral lower extremity edema and ascites. No significant urine output thus far,.  Prior history of urinary retention. 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  Nadolol and propranolol both on home med list. -Resumed home nadolol  -GI team recommends IV albumin 25 g every 6 hours x 4 doses on 01/14/2023 -Recommends Rocephin 2 g x 5 days for SBP prophylaxis started on 01/11/2023 -Lactulose reduced to 10 g twice daily-goal of 2-3 soft BM per day -Propranolol on hold -Continue rifaximin  6)Hypertension with renal disease BP stable, following  7)Type 2 diabetes mellitus with diabetic chronic kidney disease (HCC) Diabetes mellitus with hyperglycemia. .Use Novolog/Humalog Sliding scale insulin with Accu-Cheks/Fingersticks as  ordered  -Held home NPH 12 units, and Novolin  AKI----acute kidney injury on CKD IV -Creatinine peaked at 2.77 this admission -Currently down to 1.92 -Baseline creatinine was 1.87 on 12/09/2022 - renally adjust  medications, avoid nephrotoxic agents / dehydration  / hypotension  Generalized weakness and deconditioning--- physical therapy eval appreciated --recommends SNF rehab  DVT prophylaxis: SCDs Code Status: DNR  Family Communication: bedside update son 10/14, 10/15, 01/13/23 Disposition: anticipate home in 1-2 days   Consultants:  GI  Procedures:  Paracentesis 10/14, albumin IV given  Antimicrobials:  Ceftriaxone 2 gm IV 10/13>>   Subjective: -- Eating and drinking -Had couple of mushy stools -Son [name] is at bedside --- as per son patient is more coherent and interactive   Objective: Vitals:   01/13/23 0434 01/13/23 1457 01/13/23 1950 01/14/23 1316  BP: (!) 120/44 (!) 136/42 (!) 141/74 (!) 104/57  Pulse: (!) 59 (!) 49 (!) 56 (!) 48  Resp: 16 16 20 18   Temp: 98.4 F (36.9 C) (!) 97.5 F (36.4 C) 97.8 F (36.6 C) 97.6 F (36.4 C)  TempSrc:  Oral  Oral  SpO2: 99% 99% 100% 99%  Weight:      Height:        Intake/Output Summary (Last 24 hours) at 01/14/2023 1640 Last data filed at 01/14/2023 1300 Gross per 24 hour  Intake 1060 ml  Output 1150 ml  Net -90 ml   Filed Weights   01/10/23 1244 01/10/23 1808  Weight: 96.2 kg 96.2 kg    Physical Exam Gen:- Awake Alert, in no acute distress  HEENT:- [place].AT,  +ve sclera icterus Neck-Supple Neck,No JVD,.  Lungs-  CTAB , fair air movement bilaterally  CV- S1, S2 normal, RRR Abd-  +ve B.Sounds, Abd Soft, No significant tenderness,  +ve Ascites Extremity- good pedal pulses  Psych-affect is appropriate, oriented x3, a lot more coherent Neuro-generalized weakness, no new focal deficits, no tremors  Data Reviewed: I have personally reviewed following labs and imaging studies  CBC: Recent Labs  Lab 01/10/23 1309 01/11/23 0407 01/12/23 0424 01/13/23 0407 01/14/23 0448  WBC 7.0 11.4* 6.2 6.5 7.8  NEUTROABS 6.4  --  4.9 4.7 5.5  HGB 11.4* 9.4* 8.2* 8.7* 9.1*  HCT 35.8* 28.8* 25.3* 26.6* 28.8*  MCV 100.8* 101.1*  100.4* 101.5* 102.9*  PLT 58* 45* 32* 36* 42*    Basic Metabolic Panel: Recent Labs  Lab 01/10/23 1309 01/11/23 0407 01/12/23 0424 01/13/23 0407 01/14/23 0448  NA 135 140 141 142 140  K 3.2* 2.7* 2.9* 2.8* 3.4*  CL 103 109 112* 113* 110  CO2 22 20* 20* 22 21*  GLUCOSE 277* 189* 118* 162* 85  BUN 72* 77* 71* 71* 66*  CREATININE 2.67* 2.77* 2.34* 2.10* 1.92*  CALCIUM 8.1* 8.2* 7.7* 7.9* 7.8*  MG  --   --   --   --  2.0  PHOS  --   --   --   --  2.5    CBG: Recent Labs  Lab 01/13/23 1948 01/14/23 0005 01/14/23 0337 01/14/23 0720 01/14/23 1128  GLUCAP 211* 128* 79 136* 178*    Recent Results (from the past 240 hour(s))  Gram stain     Status: None   Collection Time: 01/11/23 11:45 AM   Specimen: Ascitic  Result Value Ref Range Status   Specimen Description ASCITIC  Final   Special Requests NONE  Final   Gram Stain   Final    WBC PRESENT, PREDOMINANTLY PMN NO ORGANISMS  SEEN CYTOSPIN SMEAR Performed at [facility], [address], [place], [place] [address]    Report Status 01/11/2023 FINAL  Final  Culture, body fluid w Gram Stain-bottle     Status: None (Preliminary result)   Collection Time: 01/11/23 11:45 AM   Specimen: Peritoneal Washings  Result Value Ref Range Status   Specimen Description PERITONEAL ASCITIC  Final   Special Requests NONE  Final   Culture   Final    NO GROWTH 3 DAYS Performed at [facility], [address] [place], [place] [address]    Report Status PENDING  Incomplete    Scheduled Meds:  allopurinol  100 mg Oral QPM   insulin aspart  0-15 Units Subcutaneous Q4H   lactulose  10 g Oral BID   levothyroxine  125 mcg Oral Q0600   midodrine  10 mg Oral TID WC   nystatin  1 Application Topical TID   phosphorus  250 mg Oral TID   propranolol  20 mg Oral Daily   rifaximin  550 mg Oral BID   sodium bicarbonate  1,300 mg Oral BID   Continuous Infusions:  albumin human     cefTRIAXone (ROCEPHIN)  IV 2 g (01/13/23 2208)    LOS: 4 days    Shon Hale, MD How to contact the [facility] Attending or Consulting provider 7A - 7P or covering provider during after hours 7P -7A, for this patient?  Check the care team in [facility] and look for a) attending/consulting [facility] provider listed and b) the [facility] team listed Log into [email or web address] to find provider on call.  Locate the [facility] provider you are looking for under [facility] and page to a number that you can be directly reached. If you still have difficulty reaching the provider, please page the [facility] (Director on Call) for the Hospitalists listed on amion for assistance.  01/14/2023, 4:40 PM

## 2023-01-15 DIAGNOSIS — L8990 Pressure ulcer of unspecified site, unspecified stage: Secondary | ICD-10-CM | POA: Insufficient documentation

## 2023-01-15 DIAGNOSIS — K7581 Nonalcoholic steatohepatitis (NASH): Secondary | ICD-10-CM | POA: Diagnosis not present

## 2023-01-15 DIAGNOSIS — K7682 Hepatic encephalopathy: Secondary | ICD-10-CM | POA: Diagnosis not present

## 2023-01-15 DIAGNOSIS — R188 Other ascites: Secondary | ICD-10-CM | POA: Diagnosis not present

## 2023-01-15 DIAGNOSIS — K652 Spontaneous bacterial peritonitis: Secondary | ICD-10-CM | POA: Diagnosis not present

## 2023-01-15 LAB — GLUCOSE, CAPILLARY
Glucose-Capillary: 113 mg/dL — ABNORMAL HIGH (ref 70–99)
Glucose-Capillary: 140 mg/dL — ABNORMAL HIGH (ref 70–99)
Glucose-Capillary: 297 mg/dL — ABNORMAL HIGH (ref 70–99)
Glucose-Capillary: 216 mg/dL — ABNORMAL HIGH (ref 70–99)
Glucose-Capillary: 137 mg/dL — ABNORMAL HIGH (ref 70–99)

## 2023-01-15 LAB — RENAL FUNCTION PANEL
Albumin: 3.2 g/dL — ABNORMAL LOW (ref 3.5–5.0)
Anion gap: 9 (ref 5–15)
BUN: 62 mg/dL — ABNORMAL HIGH (ref 8–23)
CO2: 20 mmol/L — ABNORMAL LOW (ref 22–32)
Calcium: 8.3 mg/dL — ABNORMAL LOW (ref 8.9–10.3)
Chloride: 111 mmol/L (ref 98–111)
Creatinine, Ser: 1.89 mg/dL — ABNORMAL HIGH (ref 0.44–1.00)
GFR, Estimated: 28 mL/min — ABNORMAL LOW (ref >60)
Glucose, Bld: 123 mg/dL — ABNORMAL HIGH (ref 70–99)
Phosphorus: 3.1 mg/dL (ref 2.5–4.6)
Potassium: 4 mmol/L (ref 3.5–5.1)
Sodium: 140 mmol/L (ref 135–145)

## 2023-01-15 LAB — CBC WITH DIFFERENTIAL/PLATELET
Abs Immature Granulocytes: 0.02 10*3/uL (ref 0.00–0.07)
Abs Immature Granulocytes: 0.04 10*3/uL (ref 0.00–0.07)
Basophils Absolute: 0 10*3/uL (ref 0.0–0.1)
Basophils Absolute: 0 10*3/uL (ref 0.0–0.1)
Basophils Relative: 0 %
Basophils Relative: 1 %
Eosinophils Absolute: 0.1 10*3/uL (ref 0.0–0.5)
Eosinophils Absolute: 0.3 10*3/uL (ref 0.0–0.5)
Eosinophils Relative: 2 %
Eosinophils Relative: 5 %
HCT: 25.3 % — ABNORMAL LOW (ref 36.0–46.0)
HCT: 28.5 % — ABNORMAL LOW (ref 36.0–46.0)
Hemoglobin: 8.2 g/dL — ABNORMAL LOW (ref 12.0–15.0)
Hemoglobin: 9.2 g/dL — ABNORMAL LOW (ref 12.0–15.0)
Immature Granulocytes: 0 %
Immature Granulocytes: 1 %
Lymphocytes Relative: 13 %
Lymphocytes Relative: 16 %
Lymphs Abs: 0.8 10*3/uL (ref 0.7–4.0)
Lymphs Abs: 1.1 10*3/uL (ref 0.7–4.0)
MCH: 32.5 pg (ref 26.0–34.0)
MCH: 33 pg (ref 26.0–34.0)
MCHC: 32.4 g/dL (ref 30.0–36.0)
MCHC: 32.3 g/dL (ref 30.0–36.0)
MCV: 100.4 fL — ABNORMAL HIGH (ref 80.0–100.0)
MCV: 102.2 fL — ABNORMAL HIGH (ref 80.0–100.0)
Monocytes Absolute: 0.4 10*3/uL (ref 0.1–1.0)
Monocytes Absolute: 0.6 10*3/uL (ref 0.1–1.0)
Monocytes Relative: 6 %
Monocytes Relative: 9 %
Neutro Abs: 4.9 10*3/uL (ref 1.7–7.7)
Neutro Abs: 4.5 10*3/uL (ref 1.7–7.7)
Neutrophils Relative %: 79 %
Neutrophils Relative %: 68 %
Platelets: 32 10*3/uL — ABNORMAL LOW (ref 150–400)
Platelets: 39 10*3/uL — ABNORMAL LOW (ref 150–400)
RBC: 2.52 MIL/uL — ABNORMAL LOW (ref 3.87–5.11)
RBC: 2.79 MIL/uL — ABNORMAL LOW (ref 3.87–5.11)
RDW: 16.1 % — ABNORMAL HIGH (ref 11.5–15.5)
RDW: 16.1 % — ABNORMAL HIGH (ref 11.5–15.5)
WBC: 6.2 10*3/uL (ref 4.0–10.5)
WBC: 6.4 10*3/uL (ref 4.0–10.5)
nRBC: 0 % (ref 0.0–0.2)
nRBC: 0 % (ref 0.0–0.2)

## 2023-01-15 NOTE — Care Management Important Message (Signed)
Important Message  Patient Details  Name: [name] MRN: [id number] Date of Birth: 07-11-1950   Important Message Given:  Yes - Medicare IM     Corey Harold 01/15/2023, 11:27 AM

## 2023-01-15 NOTE — Progress Notes (Signed)
Gastroenterology Progress Note   Referring Provider: No ref. provider found Primary Care Physician:  Benita Stabile, MD Primary Gastroenterologist:  Dr. Loreta Ave  Patient ID: [name]; [id number]; [birth year]    Subjective   Denies any abdominal pain or confusion.  Bowel movement early this morning and small 1 just after breakfast.  She states she had 2-3, possibly 4 bowel movements yesterday.  Still remains slightly sore from insulin injections.  Denies any melena or BRBPR.  Ate good for breakfast.  Objective   Vital signs in last 24 hours Temp:  [97.6 F (36.4 C)-98.2 F (36.8 C)] 98.2 F (36.8 C) (10/18 0426) Pulse Rate:  [46-64] 64 (10/18 0426) Resp:  [18-20] 20 (10/18 0426) BP: (104-148)/(45-57) 111/45 (10/18 0426) SpO2:  [99 %-100 %] 99 % (10/18 0426) Last BM Date : 01/14/23  Physical Exam General:   Alert and oriented, pleasant Head:  Normocephalic and atraumatic. Eyes:  No icterus, sclera clear. Conjuctiva pink.  Mouth:  Without lesions, mucosa pink and moist.  Abdomen:  Bowel sounds present, soft..  Mild distention but soft.  Non tender. No hernias noted. No rebound or guarding Extremities:  Without clubbing or edema. Psych:  Alert and cooperative. Normal mood and affect.  Intake/Output from previous day: 10/17 0701 - 10/18 0700 In: 701.3 [P.O.:480; IV Piggyback:221.3] Out: -  Intake/Output this shift: Total I/O In: 240 [P.O.:240] Out: -   Lab Results  Recent Labs    01/13/23 0407 01/14/23 0448 01/15/23 0453  WBC 6.5 7.8 6.4  HGB 8.7* 9.1* 9.2*  HCT 26.6* 28.8* 28.5*  PLT 36* 42* 39*   BMET Recent Labs    01/13/23 0407 01/14/23 0448 01/15/23 0453  NA 142 140 140  K 2.8* 3.4* 4.0  CL 113* 110 111  CO2 22 21* 20*  GLUCOSE 162* 85 123*  BUN 71* 66* 62*  CREATININE 2.10* 1.92* 1.89*  CALCIUM 7.9* 7.8* 8.3*   LFT Recent Labs    01/13/23 0407 01/14/23 0448 01/15/23 0453  PROT 4.7*  --   --   ALBUMIN 2.5* 2.6* 3.2*  AST 24  --   --    ALT 18  --   --   ALKPHOS 76  --   --   BILITOT 1.0  --   --    PT/INR Recent Labs    01/13/23 0407  LABPROT 23.1*  INR 2.0*   Hepatitis Panel No results for input(s): "HEPBSAG", "HCVAB", "HEPAIGM", "HEPBIGM" in the last 72 hours.  Studies/Results US Paracentesis  Result Date: 01/11/2023 INDICATION: Abdominal ascites. EXAM: ULTRASOUND GUIDED  PARACENTESIS MEDICATIONS: None. COMPLICATIONS: None immediate. PROCEDURE: Informed written consent was obtained from the patient after a discussion of the risks, benefits and alternatives to treatment. A timeout was performed prior to the initiation of the procedure. Initial ultrasound scanning demonstrates a large amount of ascites within the left lower abdominal quadrant. The left lower abdomen was prepped and draped in the usual sterile fashion. 1% lidocaine was used for local anesthesia. Following this, a 19 gauge, 10-cm, Yueh catheter was introduced. Initial attempt with the patient in the left lateral decubitus position just below the umbilicus was unsuccessful. We then prepped more to the left and superior. An ultrasound image was saved for documentation purposes. The paracentesis was performed. The catheter was removed and a dressing was applied. The patient tolerated the procedure well without immediate post procedural complication. Patient received post-procedure intravenous albumin; see nursing notes for details. FINDINGS: A total of approximately 5.5  L of yellow fluid was removed. Samples were sent to the laboratory as requested by the clinical team. IMPRESSION: Successful ultrasound-guided paracentesis yielding 5.5 liters of peritoneal fluid. Electronically Signed   By: Marin Roberts M.D.   On: 01/11/2023 13:33   CT Head Wo Contrast  Result Date: 01/10/2023 CLINICAL DATA:  Delirium EXAM: CT HEAD WITHOUT CONTRAST TECHNIQUE: Contiguous axial images were obtained from the base of the skull through the vertex without intravenous  contrast. RADIATION DOSE REDUCTION: This exam was performed according to the departmental dose-optimization program which includes automated exposure control, adjustment of the mA and/or kV according to patient size and/or use of iterative reconstruction technique. COMPARISON:  CT head 08/25/22 FINDINGS: Brain: No hemorrhage. No hydrocephalus. No extra-axial fluid collection. No CT evidence of an acute cortical infarct. No mass effect. No mass lesion. Vascular: No hyperdense vessel or unexpected calcification. Skull: Normal. Negative for fracture or focal lesion. Sinuses/Orbits: No middle ear or mastoid effusion. Paranasal sinuses are clear. Right lens replacement. Orbits are otherwise unremarkable. Other: None. IMPRESSION: No acute intracranial abnormality. Electronically Signed   By: Lorenza Cambridge M.D.   On: 01/10/2023 16:06   US Paracentesis  Result Date: 01/01/2023 INDICATION: Ascites. EXAM: ULTRASOUND GUIDED therapeutic PARACENTESIS MEDICATIONS: None. COMPLICATIONS: None immediate. PROCEDURE: Informed written consent was obtained from the patient after a discussion of the risks, benefits and alternatives to treatment. A timeout was performed prior to the initiation of the procedure. Initial ultrasound scanning demonstrates a moderate amount of ascites within the left lower abdominal quadrant. The right lower abdomen was prepped and draped in the usual sterile fashion. 1% lidocaine was used for local anesthesia. Following this, a paracentesis catheter was introduced. An ultrasound image was saved for documentation purposes. The paracentesis was performed. The catheter was removed and a dressing was applied. The patient tolerated the procedure well without immediate post procedural complication. FINDINGS: A total of approximately 4 L of serous fluid was removed. IMPRESSION: Successful ultrasound-guided paracentesis yielding 4 liters of peritoneal fluid. Electronically Signed   By: Lupita Raider M.D.   On:  01/01/2023 11:10   US Paracentesis  Result Date: 12/23/2022 INDICATION: Patient with history of NASH cirrhosis, recurrent ascites. Request for diagnostic and therapeutic paracentesis. EXAM: ULTRASOUND GUIDED DIAGNOSTIC AND THERAPEUTIC PARACENTESIS MEDICATIONS: 7 mL 1% lidocaine. COMPLICATIONS: None immediate. PROCEDURE: Informed written consent was obtained from the patient after a discussion of the risks, benefits and alternatives to treatment. A timeout was performed prior to the initiation of the procedure. Initial ultrasound scanning demonstrates a large amount of ascites within the left lower abdominal quadrant. The left lower abdomen was prepped and draped in the usual sterile fashion. 1% lidocaine was used for local anesthesia. Following this, a 19 gauge, 10-cm, Yueh catheter was introduced. An ultrasound image was saved for documentation purposes. The paracentesis was performed. The catheter was removed and a dressing was applied. The patient tolerated the procedure well without immediate post procedural complication. Patient received post-procedure intravenous albumin; see nursing notes for details. FINDINGS: A total of approximately 4.0 L of clear yellow fluid was removed. Samples were sent to the laboratory as requested by the clinical team. IMPRESSION: Successful ultrasound-guided paracentesis yielding 4.0 liters of peritoneal fluid. Performed by Lynnette Caffey, PA-C PLAN: The patient has required >/=2 paracenteses in a 30 day period and a formal evaluation by the [facility] has been arranged. Electronically Signed   By: Acquanetta Belling M.D.   On: 12/23/2022 11:48  Assessment  72 y.o. female with a history of   Anemia: -Multifactorial in setting of IDA and chronic disease.  Follows with hematology -Baseline hemoglobin appears to be 9-10 range.  Suspect some mild decline with hemodilution initially.  Hemoglobin currently stable -Recommend  follow-up with primary GI for EGD and colonoscopy as previously recommended  Decompensated Cirrhosis: -Course complicated by SBP and HE -Course also complicated by esophageal varices s/p banding.  Previously on NSBB, but discontinued given SBP -Has required frequent paracenteses given limited diuretic use in the setting of CKD -Ongoing thrombocytopenia (plts 39) - MELD 3.0: 21  Hepatic Encephalopathy: -Mild intermittent hepatic encephalopathy over the last year -Suspect worsening secondary to SBP -Continue Xifaxan 550 mg 3 times daily -Currently on lactulose, continue to titrate for 2-3 soft bowel movements daily.  SBP:  -Paracentesis 10/14 with removal of 5.5 L.  PMNs 10,737.  Culture with no growth. -IV Rocephin 2 g daily ordered, continue for 5 days total -Albumin day 1 and day 5 completed.  Repeated albumin yesterday -No prior SBP however given current SBP will need ongoing prophylaxis -patient has declined Bactrim therefore will need to consider Cipro (listed allergy however has received in the past without any significant reaction, will need to monitor kidney function closely)  Plan / Recommendations  Rocephin 2g daily for 5 days D/c on Cipro with close renal function monitoring (pt refused Bactrim) Hold propranolol given SBP Continue lactulose, titrate for 2-3 BM daily Continue Xifaxin 550 mg BID 2g sodium diet Outpatient f/u with primary GI for EGD and colonoscopy     LOS: 5 days   01/15/2023, 10:55 AM  Brooke Bonito, MSN, FNP-BC, AGACNP-BC [facility]

## 2023-01-15 NOTE — Progress Notes (Signed)
PROGRESS NOTE  [name]  MVH:[id number] DOB: 1951-03-10 DOA: 01/10/2023 PCP: Benita Stabile, MD   Chief Complaint  Patient presents with   Altered Mental Status   Level of care: Med-Surg  Brief Admission History:  72 y.o. female with medical history significant for liver cirrhosis, diabetes mellitus, hypertension, CKD.  Patient was brought to the ED with reports of confusion, progressive abdominal and leg swelling, and vomiting, slow to respond.  Family is at bedside and provides the history.  At the time of my evaluation patient is awake, lethargic, not answering questions.  Family reports overnight patient had multiple episodes of vomiting, and has barely had any oral intake for the past couple of days.  No diarrhea.  She has never been on lactulose or rifaximin.  She is due for paracentesis 10/14, she gets this every 9 to 10 days.   ED Course: Temperature down to 94.3, improved with warming blanket.  Heart rate 69 to 90s.  Respirate rate 18-24.  Blood pressure systolic 102-130.  O2 sats greater than 95% on room air.  Ammonia 130.  WBC 7.  VBG shows pH of 7.4, pCO2 of 35.  Troponin 9 x 2.  UA not suggestive of UTI.  Head CT negative for acute abnormality.  Lactulose 30 g given in ED.  Hospitalist admit for acute hepatic encephalopathy.   01/15/23 -Patient is medically stable for discharge to SNF rehab when insurance authorization can be obtained  Assessment and Plan: Acute hepatic encephalopathy No prior history of hepatic encephalopathy.  Ammonia level elevated at 130.  Head CT negative for acute abnormality.  She also appears dehydrated.  Need to rule out SBP. -Improved with lactulose enema transition to oral lactulose -IV ceftriaxone x 5 days for SBP -ammonia down to 47  -Status post paracentesis on 01/11/2023 with 5.5L cloudy ascitic fluid removed -lactulose ordered BID to maintain lower ammonia levels (pt says she had not been on this before) -01/15/23 -Mentation improved  significantly and now remains at her prior baseline -Lactulose recently adjusted    2)SBP ---Ascitic fluid cell count from paracentesis on 01/11/2023 greater than 11,000 -Creatinine and culture negative --IV ceftriaxone 2 g daily x 5 days started on 01/11/2023 (last dose 01/15/2023) - US guided paracentesis completed 10/14, followed by IV albumin 25 gm  --GI team recommended IV albumin --patient received IV albumin on 01/12/2023 and again on 01/14/2023  (25 g every 6 hours x 4 doses) -  3)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   -Likely prerenal- multiple episodes of vomiting overnight, poor oral intake.  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  Nadolol and propranolol both on home med list. -Resumed home nadolol  -GI team recommends IV albumin 25 g every 6 hours x 4 doses on 01/14/2023 -Recommends Rocephin 2 g x 5 days for SBP prophylaxis started on 01/11/2023 -Lactulose reduced to 10 g twice daily-goal of 2-3 soft BM per day -c/n Propranolol   -Continue Rifaximin -Continue midodrine  6)Hypertension with renal disease BP stable, following  7)Type 2 diabetes mellitus with diabetic chronic kidney disease (HCC) -A1c 6.7 reflecting good diabetic control PTA -Use Novolog/Humalog Sliding scale insulin  with Accu-Cheks/Fingersticks as ordered   8)AKI----acute kidney injury on CKD IV -Creatinine peaked at 2.77 this admission -Currently is back down to recent baseline -Baseline creatinine was 1.87 on 12/09/2022 - renally adjust medications,  avoid nephrotoxic agents / dehydration  / hypotension  9)Generalized Weakness and Deconditioning--- physical therapy eval appreciated --recommends SNF rehab  DVT prophylaxis: SCDs Code Status: DNR  Family Communication:  updated son Disposition: -01/15/23---Patient is medically stable for discharge to SNF rehab when insurance authorization can be obtained   Consultants:  GI  Procedures:  Paracentesis 10/14, albumin IV given  Antimicrobials:  Ceftriaxone 2 gm IV 10/13>>   Subjective: -- -Sitting up in the chair -Tolerating oral intake well -Had soft BM  01/15/23 -Patient is medically stable for discharge to SNF rehab when insurance authorization can be obtained  Objective: Vitals:   01/14/23 1316 01/14/23 2025 01/15/23 0426 01/15/23 1415  BP: (!) 104/57 (!) 148/50 (!) 111/45 (!) 122/46  Pulse: (!) 48 (!) 46 64 63  Resp: 18 20 20 16   Temp: 97.6 F (36.4 C) 98.2 F (36.8 C) 98.2 F (36.8 C) 97.7 F (36.5 C)  TempSrc: Oral     SpO2: 99% 100% 99% 100%  Weight:      Height:        Intake/Output Summary (Last 24 hours) at 01/15/2023 1554 Last data filed at 01/15/2023 0900 Gross per 24 hour  Intake 701.25 ml  Output --  Net 701.25 ml   Filed Weights   01/10/23 1244 01/10/23 1808  Weight: 96.2 kg 96.2 kg    Physical Exam Gen:- Awake Alert, in no acute distress  HEENT:- [place].AT,  +ve sclera icterus Neck-Supple Neck,No JVD,.  Lungs-  CTAB , fair air movement bilaterally  CV- S1, S2 normal, RRR Abd-  +ve B.Sounds, Abd Soft, No significant tenderness,  +ve Ascites Extremity- good pedal pulses  Psych-affect is appropriate, oriented x3, a lot more coherent Neuro-generalized weakness, no new focal deficits, no tremors  Data Reviewed: I have personally reviewed following labs and imaging studies  CBC: Recent Labs  Lab 01/10/23 1309 01/11/23 0407 01/12/23 0424 01/13/23 0407 01/14/23 0448 01/15/23 0453  WBC 7.0 11.4* 6.2 6.5 7.8 6.4  NEUTROABS 6.4  --  4.9  4.7 5.5 4.5  HGB 11.4* 9.4* 8.2* 8.7* 9.1* 9.2*  HCT 35.8* 28.8* 25.3* 26.6* 28.8* 28.5*  MCV 100.8* 101.1* 100.4* 101.5* 102.9* 102.2*  PLT 58* 45* 32* 36* 42* 39*   Basic Metabolic Panel: Recent Labs  Lab 01/11/23 0407 01/12/23 0424 01/13/23 0407 01/14/23 0448 01/15/23 0453  NA 140 141 142 140 140  K 2.7* 2.9* 2.8* 3.4* 4.0  CL 109 112* 113* 110 111  CO2 20* 20* 22 21* 20*  GLUCOSE 189* 118* 162* 85 123*  BUN 77* 71* 71* 66* 62*  CREATININE 2.77* 2.34* 2.10* 1.92* 1.89*  CALCIUM 8.2* 7.7* 7.9* 7.8* 8.3*  MG  --   --   --  2.0  --   PHOS  --   --   --  2.5 3.1   CBG: Recent Labs  Lab 01/14/23 2027 01/14/23 2339 01/15/23 0418 01/15/23 0726 01/15/23 1108  GLUCAP 225* 138* 113* 140* 297*    Recent Results (from the past 240 hour(s))  Gram stain     Status: None   Collection Time: 01/11/23 11:45 AM   Specimen: Ascitic  Result Value Ref Range Status   Specimen Description ASCITIC  Final   Special Requests NONE  Final   Gram Stain  Final    WBC PRESENT, PREDOMINANTLY PMN NO ORGANISMS SEEN CYTOSPIN SMEAR Performed at [facility], [address], [place], [place] [address]    Report Status 01/11/2023 FINAL  Final  Culture, body fluid w Gram Stain-bottle     Status: None (Preliminary result)   Collection Time: 01/11/23 11:45 AM   Specimen: Peritoneal Washings  Result Value Ref Range Status   Specimen Description PERITONEAL ASCITIC  Final   Special Requests NONE  Final   Culture   Final    NO GROWTH 4 DAYS Performed at [facility], [address] [place], [place] [address]    Report Status PENDING  Incomplete    Scheduled Meds:  allopurinol  100 mg Oral QPM   insulin aspart  0-15 Units Subcutaneous Q4H   lactulose  10 g Oral BID   levothyroxine  125 mcg Oral Q0600   midodrine  10 mg Oral TID WC   nystatin  1 Application Topical TID   propranolol  20 mg Oral Daily   rifaximin  550 mg Oral BID   sodium bicarbonate  1,300 mg Oral BID   Continuous  Infusions:   LOS: 5 days   Shon Hale, MD How to contact the [facility] Attending or Consulting provider 7A - 7P or covering provider during after hours 7P -7A, for this patient?  Check the care team in [facility] and look for a) attending/consulting TRH provider listed and b) the [facility] team listed Log into [email or web address] to find provider on call.  Locate the [facility] provider you are looking for under [facility] and page to a number that you can be directly reached. If you still have difficulty reaching the provider, please page the [facility] (Director on Call) for the Hospitalists listed on amion for assistance.  01/15/2023, 3:54 PM

## 2023-01-15 NOTE — TOC Progression Note (Signed)
Transition of Care [facility]) - Progression Note    Patient Details  Name: [name] MRN: [id number] Date of Birth: 09/06/1950  Transition [facility]) CM/SW Contact  Leighla Chestnutt A Dariana Garbett, RN Phone Number: 01/15/2023, 6:17 PM  Clinical Narrative:    Spoke with Son [name] and discussed SNF options. First preference [facility], [facility], and [facility] declined. [facility] accepted but son is not agreeable for pt to go there. Reached out to [facility] and they are not able to take anyone until Monday. Explained to son that those are the only two choices but son is adamant to look further out. TOC will search further out per request.   Expected Discharge Plan: Home w Home Health Services Barriers to Discharge: Continued Medical Work up  Expected Discharge Plan and Services In-house Referral: Clinical Social Work   Post Acute Care Choice: Home Health Living arrangements for the past 2 months: Single Family Home                           HH Arranged: PT, OT, Nurse's Aide HH Agency: [facility] Date HH Agency Contacted: 01/11/23 Time HH Agency Contacted: [phone] Representative spoke with at [facility] Agency: Morrie Sheldon   Social Determinants of Health (SDOH) Interventions SDOH Screenings   Food Insecurity: No Food Insecurity (01/10/2023)  Housing: Low Risk  (01/10/2023)  Transportation Needs: No Transportation Needs (01/10/2023)  Utilities: Not At Risk (01/10/2023)  Alcohol Screen: Low Risk  (10/30/2019)  Depression (PHQ2-9): Medium Risk (10/30/2019)  Financial Resource Strain: Medium Risk (10/30/2019)  Physical Activity: Inactive (10/30/2019)  Social Connections: Moderately Integrated (10/30/2019)  Stress: No Stress Concern Present (10/30/2019)  Tobacco Use: Low Risk  (01/11/2023)    Readmission Risk Interventions    01/11/2023    8:51 AM 09/04/2022   12:38 PM  Readmission Risk Prevention Plan  Transportation Screening Complete Complete  HRI or  Home Care Consult Complete Complete  Social Work Consult for Recovery Care Planning/Counseling Complete Complete  Palliative Care Screening Not Applicable Complete  Medication Review Oceanographer) Complete Complete

## 2023-01-15 NOTE — Plan of Care (Signed)
[facility] Tonsillectomy/Adenoidectomy, Postoperative PEDS care plan entered in error.

## 2023-01-16 DIAGNOSIS — K7460 Unspecified cirrhosis of liver: Secondary | ICD-10-CM | POA: Diagnosis not present

## 2023-01-16 DIAGNOSIS — K652 Spontaneous bacterial peritonitis: Secondary | ICD-10-CM | POA: Diagnosis not present

## 2023-01-16 DIAGNOSIS — K7581 Nonalcoholic steatohepatitis (NASH): Secondary | ICD-10-CM | POA: Diagnosis not present

## 2023-01-16 DIAGNOSIS — K7682 Hepatic encephalopathy: Secondary | ICD-10-CM | POA: Diagnosis not present

## 2023-01-16 LAB — CBC WITH DIFFERENTIAL/PLATELET
Abs Immature Granulocytes: 0.04 10*3/uL (ref 0.00–0.07)
Basophils Absolute: 0 10*3/uL (ref 0.0–0.1)
Basophils Relative: 0 %
Eosinophils Absolute: 0.3 10*3/uL (ref 0.0–0.5)
Eosinophils Relative: 5 %
HCT: 28.7 % — ABNORMAL LOW (ref 36.0–46.0)
Hemoglobin: 8.9 g/dL — ABNORMAL LOW (ref 12.0–15.0)
Immature Granulocytes: 1 %
Lymphocytes Relative: 15 %
Lymphs Abs: 1 10*3/uL (ref 0.7–4.0)
MCH: 32.5 pg (ref 26.0–34.0)
MCHC: 31 g/dL (ref 30.0–36.0)
MCV: 104.7 fL — ABNORMAL HIGH (ref 80.0–100.0)
Monocytes Absolute: 0.5 10*3/uL (ref 0.1–1.0)
Monocytes Relative: 8 %
Neutro Abs: 4.7 10*3/uL (ref 1.7–7.7)
Neutrophils Relative %: 71 %
Platelets: 34 10*3/uL — ABNORMAL LOW (ref 150–400)
RBC: 2.74 MIL/uL — ABNORMAL LOW (ref 3.87–5.11)
RDW: 16.6 % — ABNORMAL HIGH (ref 11.5–15.5)
WBC: 6.5 10*3/uL (ref 4.0–10.5)
nRBC: 0 % (ref 0.0–0.2)

## 2023-01-16 LAB — GLUCOSE, CAPILLARY
Glucose-Capillary: 116 mg/dL — ABNORMAL HIGH (ref 70–99)
Glucose-Capillary: 135 mg/dL — ABNORMAL HIGH (ref 70–99)
Glucose-Capillary: 167 mg/dL — ABNORMAL HIGH (ref 70–99)
Glucose-Capillary: 176 mg/dL — ABNORMAL HIGH (ref 70–99)
Glucose-Capillary: 186 mg/dL — ABNORMAL HIGH (ref 70–99)
Glucose-Capillary: 202 mg/dL — ABNORMAL HIGH (ref 70–99)

## 2023-01-16 LAB — CULTURE, BODY FLUID W GRAM STAIN -BOTTLE: Culture: NO GROWTH

## 2023-01-16 MED ORDER — CIPROFLOXACIN HCL 250 MG PO TABS
500.0000 mg | ORAL_TABLET | Freq: Every day | ORAL | Status: DC
  Administered 2023-01-16 – 2023-01-19 (×4): 500 mg via ORAL
  Filled 2023-01-16 (×4): qty 2

## 2023-01-16 NOTE — TOC Progression Note (Signed)
Transition of Care [facility]) - Progression Note    Patient Details  Name: [name] MRN: [id number] Date of Birth: June 29, 1950  [facility]) CM/SW Contact  Kethan Papadopoulos A Nuri Branca, RN Phone Number: 01/16/2023, 5:33 PM  Clinical Narrative:    Spoke with son. Explained to son that request for SNF further out are pending. Also explained that orders for discharge are not in place but TOC is working on smooth transition to facility when that time comes. Explained to the son that due to weekend, a response to SNF requests may not be available until Monday and at that time he will need to make a decision with the patient of the facility of choice. Son understands. TOC will continue to follow and monitor SNF requests as available.   Expected Discharge Plan: Home w Home Health Services Barriers to Discharge: Continued Medical Work up  Expected Discharge Plan and Services In-house Referral: Clinical Social Work   Post Acute Care Choice: Home Health Living arrangements for the past 2 months: Single Family Home                           HH Arranged: PT, OT, Nurse's Aide HH Agency: [facility] Date HH Agency Contacted: 01/11/23 Time HH Agency Contacted: [phone] Representative spoke with at [facility] Agency: Morrie Sheldon   Social Determinants of Health (SDOH) Interventions SDOH Screenings   Food Insecurity: No Food Insecurity (01/10/2023)  Housing: Low Risk  (01/10/2023)  Transportation Needs: No Transportation Needs (01/10/2023)  Utilities: Not At Risk (01/10/2023)  Alcohol Screen: Low Risk  (10/30/2019)  Depression (PHQ2-9): Medium Risk (10/30/2019)  Financial Resource Strain: Medium Risk (10/30/2019)  Physical Activity: Inactive (10/30/2019)  Social Connections: Moderately Integrated (10/30/2019)  Stress: No Stress Concern Present (10/30/2019)  Tobacco Use: Low Risk  (01/11/2023)    Readmission Risk Interventions    01/11/2023    8:51 AM 09/04/2022   12:38 PM  Readmission Risk  Prevention Plan  Transportation Screening Complete Complete  HRI or Home Care Consult Complete Complete  Social Work Consult for Recovery Care Planning/Counseling Complete Complete  Palliative Care Screening Not Applicable Complete  Medication Review Oceanographer) Complete Complete

## 2023-01-16 NOTE — Progress Notes (Signed)
[name], M.D. Gastroenterology & Hepatology   Interval History:  Patient reports feeling well and denies having any complaints.  Has been eating well.  No nausea, vomiting, fever or chills.  Not presenting any worsening abdominal distention. She finished her antibiotic course on 01/14/2023.  Inpatient Medications:  Current Facility-Administered Medications:    acetaminophen (TYLENOL) tablet 650 mg, 650 mg, Oral, Q6H PRN **OR** acetaminophen (TYLENOL) suppository 650 mg, 650 mg, Rectal, Q6H PRN, Emokpae, Ejiroghene E, MD   allopurinol (ZYLOPRIM) tablet 100 mg, 100 mg, Oral, QPM, Johnson, Clanford L, MD, 100 mg at 01/15/23 1654   insulin aspart (novoLOG) injection 0-15 Units, 0-15 Units, Subcutaneous, Q4H, Emokpae, Ejiroghene E, MD, 2 Units at 01/16/23 0457   lactulose (CHRONULAC) 10 GM/15ML solution 10 g, 10 g, Oral, BID, Emokpae, Courage, MD, 10 g at 01/16/23 0826   levothyroxine (SYNTHROID) tablet 125 mcg, 125 mcg, Oral, Q0600, Emokpae, Ejiroghene E, MD, 125 mcg at 01/16/23 0457   midodrine (PROAMATINE) tablet 10 mg, 10 mg, Oral, TID WC, Zierle-Ghosh, Asia B, DO, 10 mg at 01/16/23 1610   nystatin (MYCOSTATIN/NYSTOP) topical powder 1 Application, 1 Application, Topical, TID, Laural Benes, Clanford L, MD, 1 Application at 01/16/23 0827   promethazine (PHENERGAN) tablet 12.5 mg, 12.5 mg, Oral, Q6H PRN, Emokpae, Ejiroghene E, MD   propranolol (INDERAL) tablet 20 mg, 20 mg, Oral, Daily, Johnson, Clanford L, MD, 20 mg at 01/16/23 0823   rifaximin (XIFAXAN) tablet 550 mg, 550 mg, Oral, BID, Ermalinda Memos S, PA-C, 550 mg at 01/15/23 2051   sodium bicarbonate tablet 1,300 mg, 1,300 mg, Oral, BID, Emokpae, Ejiroghene E, MD, 1,300 mg at 01/16/23 0826   I/O    Intake/Output Summary (Last 24 hours) at 01/16/2023 0857 Last data filed at 01/15/2023 0900 Gross per 24 hour  Intake 240 ml  Output --  Net 240 ml     Physical Exam: Temp:  [97.7 F (36.5 C)-97.9 F (36.6 C)] 97.7 F (36.5 C)  (10/19 0427) Pulse Rate:  [58-63] 58 (10/19 0823) Resp:  [16] 16 (10/19 0427) BP: (115-122)/(41-48) 115/41 (10/19 0823) SpO2:  [99 %-100 %] 99 % (10/19 0427)  Temp (24hrs), Avg:97.8 F (36.6 C), Min:97.7 F (36.5 C), Max:97.9 F (36.6 C) GENERAL: The patient is AO x3, in no acute distress. HEENT: Head is normocephalic and atraumatic. EOMI are intact. Mouth is well hydrated and without lesions. NECK: Supple. No masses LUNGS: Clear to auscultation. No presence of rhonchi/wheezing/rales. Adequate chest expansion HEART: RRR, normal s1 and s2. ABDOMEN: Soft, nontender, no guarding, no peritoneal signs, very mild amount of ascites. BS +. No masses. EXTREMITIES: Without any cyanosis, clubbing, rash, lesions or edema.  No asterixis. NEUROLOGIC: AOx3, no focal motor deficit. SKIN: no jaundice, no rashes  Laboratory Data: CBC:     Component Value Date/Time   WBC 6.5 01/16/2023 0452   RBC 2.74 (L) 01/16/2023 0452   HGB 8.9 (L) 01/16/2023 0452   HCT 28.7 (L) 01/16/2023 0452   PLT 34 (L) 01/16/2023 0452   MCV 104.7 (H) 01/16/2023 0452   MCH 32.5 01/16/2023 0452   MCHC 31.0 01/16/2023 0452   RDW 16.6 (H) 01/16/2023 0452   LYMPHSABS 1.0 01/16/2023 0452   MONOABS 0.5 01/16/2023 0452   EOSABS 0.3 01/16/2023 0452   BASOSABS 0.0 01/16/2023 0452   COAG:  Lab Results  Component Value Date   INR 2.0 (H) 01/13/2023   INR 1.04 01/20/2009    BMP:     Latest Ref Rng & Units 01/15/2023  4:53 AM 01/14/2023    4:48 AM 01/13/2023    4:07 AM  BMP  Glucose 70 - 99 mg/dL 161  85  096   BUN 8 - 23 mg/dL 62  66  71   Creatinine 0.44 - 1.00 mg/dL 0.45  4.09  8.11   Sodium 135 - 145 mmol/L 140  140  142   Potassium 3.5 - 5.1 mmol/L 4.0  3.4  2.8   Chloride 98 - 111 mmol/L 111  110  113   CO2 22 - 32 mmol/L 20  21  22    Calcium 8.9 - 10.3 mg/dL 8.3  7.8  7.9     HEPATIC:     Latest Ref Rng & Units 01/15/2023    4:53 AM 01/14/2023    4:48 AM 01/13/2023    4:07 AM  Hepatic Function  Total  Protein 6.5 - 8.1 g/dL   4.7   Albumin 3.5 - 5.0 g/dL 3.2  2.6  2.5   AST 15 - 41 U/L   24   ALT 0 - 44 U/L   18   Alk Phosphatase 38 - 126 U/L   76   Total Bilirubin 0.3 - 1.2 mg/dL   1.0     CARDIAC:  Lab Results  Component Value Date   CKTOTAL 93 01/20/2009   CKMB 1.4 01/20/2009   TROPONINI <0.03 12/02/2015      Imaging: I personally reviewed and interpreted the available labs, imaging and endoscopic files.   Assessment/Plan: [name] is a 72 y.o. year old female with history of NASH cirrhosis, esohpageal varices s/p banding, ascites requiring paracentesis, GERD, diabetes mellitus, hypertension, HLD, CKD, chronic anemia, who was admitted to the hospital after presenting worsening abdominal pain, vomiting and altered mental status.  Patient was found to have SBP on fluid analysis as she had a total of 10,137 PMNs after paracentesis of 5.5 L.  Due to this, she was given antibiotic course for 5 days which she finished on 01/14/2023, as well as albumin per protocol.  She presented significant improvement of her symptoms after finishing this course and is currently asymptomatic.  Patient she will be started on ciprofloxacin prophylaxis, renally dose.  This should be given on a daily basis.  Regular doses 500 mg daily.  She will follow-up with her primary gastroenterologist upon discharge as well.  Regarding her episode of hepatic encephalopathy, this has been controlled after infection has been treated.  She should continue with lactulose to achieve 2-3 bowel movements per day.  While hospitalized she should continue with Xifaxan twice daily but she should not be discharged on this.  -Advance diet as tolerated -Start ciprofloxacin prophylaxis daily, discussed with pharmacy for renal adjustment (regular dose is 500 mg every day) -Continue lactulose to achieve 2-3 bowel movements per day -Continue Xifaxan 550 mg twice a day -Follow-up with primary gastroenterologist upon  discharge  Brandy Blazing, MD Gastroenterology and Hepatology [facility]astroenterology

## 2023-01-16 NOTE — Progress Notes (Signed)
PROGRESS NOTE  [name]  EXB:[id number] DOB: Jun 20, 1950 DOA: 01/10/2023 PCP: Benita Stabile, MD   Chief Complaint  Patient presents with   Altered Mental Status   Level of care: Med-Surg  Brief Admission History:  72 y.o. female with medical history significant for liver cirrhosis, diabetes mellitus, hypertension, CKD.  Patient was brought to the ED with reports of confusion, progressive abdominal and leg swelling, and vomiting, slow to respond.  Family is at bedside and provides the history.  At the time of my evaluation patient is awake, lethargic, not answering questions.  Family reports overnight patient had multiple episodes of vomiting, and has barely had any oral intake for the past couple of days.  No diarrhea.  She has never been on lactulose or rifaximin.  She is due for paracentesis 10/14, she gets this every 9 to 10 days.   ED Course: Temperature down to 94.3, improved with warming blanket.  Heart rate 69 to 90s.  Respirate rate 18-24.  Blood pressure systolic 102-130.  O2 sats greater than 95% on room air.  Ammonia 130.  WBC 7.  VBG shows pH of 7.4, pCO2 of 35.  Troponin 9 x 2.  UA not suggestive of UTI.  Head CT negative for acute abnormality.  Lactulose 30 g given in ED.  Hospitalist admit for acute hepatic encephalopathy.   01/16/23 -Patient is medically stable for discharge to SNF rehab when insurance authorization can be obtained  Assessment and Plan: Acute hepatic encephalopathy No prior history of hepatic encephalopathy.  Ammonia level elevated at 130.  Head CT negative for acute abnormality.  She also appears dehydrated.  Need to rule out SBP. -Improved with lactulose enema transition to oral lactulose -IV ceftriaxone x 5 days for SBP -ammonia down to 47  -Status post paracentesis on 01/11/2023 with 5.5L cloudy ascitic fluid removed -lactulose ordered BID to maintain lower ammonia levels (pt says she had not been on this before) -01/15/23 -Mentation improved  significantly and now remains at her prior baseline -Lactulose recently adjusted    2)SBP ---Ascitic fluid cell count from paracentesis on 01/11/2023 greater than 11,000 -Creatinine and culture negative -- Completed IV ceftriaxone 2 g daily x 5 days started on 01/10/2023 (last dose 01/14/2023) - US guided paracentesis completed 10/14, followed by IV albumin 25 gm  --GI team recommended IV albumin --patient received IV albumin on 01/12/2023 and again on 01/14/2023  (25 g every 6 hours x 4 doses) -- GI team recommends ciprofloxacin renally adjusted dose for SBP prophylaxis -  3)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   -Likely prerenal- multiple episodes of vomiting overnight, poor oral intake.  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  Nadolol and propranolol both on home med list. -Resumed home nadolol  -GI team recommends IV albumin 25 g every 6 hours x 4 doses on 01/14/2023 -Recommends Rocephin 2 g x 5 days for SBP prophylaxis started on 01/11/2023 -Lactulose reduced to 10 g twice daily-goal of 2-3 soft BM per day -c/n Propranolol   -Continue Rifaximin while in the hospital however per GI team do nNot discharge patient on rifaximin -Continue midodrine  -Advance diet as  tolerated -- GI team recommends ciprofloxacin renally adjusted dose for SBP prophylaxis -  6)Hypertension with renal disease BP stable, following  7)Type 2 diabetes mellitus with diabetic chronic kidney disease (HCC) -A1c 6.7 reflecting good diabetic control PTA -Use  Novolog/Humalog Sliding scale insulin with Accu-Cheks/Fingersticks as ordered   8)AKI----acute kidney injury on CKD IV -Creatinine peaked at 2.77 this admission -Currently is back down to recent baseline -Baseline creatinine was 1.87 on 12/09/2022 - renally adjust medications, avoid nephrotoxic agents / dehydration  / hypotension  9)Generalized Weakness and Deconditioning--- physical therapy eval appreciated --recommends SNF rehab  DVT prophylaxis: SCDs Code Status: DNR  Family Communication:  updated son Disposition: -01/15/23---Patient is medically stable for discharge to SNF rehab when insurance authorization can be obtained   Consultants:  GI  Procedures:  Paracentesis 10/14, albumin IV given  Antimicrobials:  Ceftriaxone 2 gm IV 10/13>>   Subjective: -- -Cooperative and coherent -Oral intake is fair -Having soft/loose stools  01/16/23 -Patient is medically stable for discharge to SNF rehab when insurance authorization can be obtained  Objective: Vitals:   01/15/23 2139 01/16/23 0427 01/16/23 0823 01/16/23 1428  BP: (!) 116/43 (!) 119/48 (!) 115/41 (!) 134/48  Pulse: 61 60 (!) 58 (!) 53  Resp: 16 16  20   Temp: 97.9 F (36.6 C) 97.7 F (36.5 C)  98.3 F (36.8 C)  TempSrc: Oral Oral  Oral  SpO2: 100% 99%  100%  Weight:      Height:        Intake/Output Summary (Last 24 hours) at 01/16/2023 1829 Last data filed at 01/16/2023 1305 Gross per 24 hour  Intake 480 ml  Output --  Net 480 ml   Filed Weights   01/10/23 1244 01/10/23 1808  Weight: 96.2 kg 96.2 kg    Physical Exam Gen:- Awake Alert, in no acute distress  HEENT:- [place].AT,  +ve sclera icterus Neck-Supple Neck,No JVD,.  Lungs-  CTAB , fair air movement bilaterally  CV- S1, S2 normal, RRR Abd-  +ve B.Sounds, Abd Soft, No significant tenderness,  +ve Ascites Extremity- good pedal pulses  Psych-affect is appropriate, oriented x3, a lot more coherent Neuro-generalized weakness, no new focal  deficits, no tremors  Data Reviewed: I have personally reviewed following labs and imaging studies  CBC: Recent Labs  Lab 01/12/23 0424 01/13/23 0407 01/14/23 0448 01/15/23 0453 01/16/23 0452  WBC 6.2 6.5 7.8 6.4 6.5  NEUTROABS 4.9 4.7 5.5 4.5 4.7  HGB 8.2* 8.7* 9.1* 9.2* 8.9*  HCT 25.3* 26.6* 28.8* 28.5* 28.7*  MCV 100.4* 101.5* 102.9* 102.2* 104.7*  PLT 32* 36* 42* 39* 34*   Basic Metabolic Panel: Recent Labs  Lab 01/11/23 0407 01/12/23 0424 01/13/23 0407 01/14/23 0448 01/15/23 0453  NA 140 141 142 140 140  K 2.7* 2.9* 2.8* 3.4* 4.0  CL 109 112* 113* 110 111  CO2 20* 20* 22 21* 20*  GLUCOSE 189* 118* 162* 85 123*  BUN 77* 71* 71* 66* 62*  CREATININE 2.77* 2.34* 2.10* 1.92* 1.89*  CALCIUM 8.2* 7.7* 7.9* 7.8* 8.3*  MG  --   --   --  2.0  --   PHOS  --   --   --  2.5 3.1   CBG: Recent Labs  Lab 01/16/23 0020 01/16/23 0432 01/16/23 0750 01/16/23 1129 01/16/23 1639  GLUCAP 167* 135* 116* 186* 202*    Recent Results (from the past 240 hour(s))  Gram stain     Status: None   Collection Time: 01/11/23 11:45 AM  Specimen: Ascitic  Result Value Ref Range Status   Specimen Description ASCITIC  Final   Special Requests NONE  Final   Gram Stain   Final    WBC PRESENT, PREDOMINANTLY PMN NO ORGANISMS SEEN CYTOSPIN SMEAR Performed at [facility], [address], [place], [place] [address]    Report Status 01/11/2023 FINAL  Final  Culture, body fluid w Gram Stain-bottle     Status: None   Collection Time: 01/11/23 11:45 AM   Specimen: Peritoneal Washings  Result Value Ref Range Status   Specimen Description PERITONEAL ASCITIC  Final   Special Requests NONE  Final   Culture   Final    NO GROWTH 5 DAYS Performed at [facility], [address], [place], [place] [address]    Report Status 01/16/2023 FINAL  Final    Scheduled Meds:  allopurinol  100 mg Oral QPM   ciprofloxacin  500 mg Oral Q breakfast   insulin aspart  0-15 Units Subcutaneous Q4H    lactulose  10 g Oral BID   levothyroxine  125 mcg Oral Q0600   midodrine  10 mg Oral TID WC   nystatin  1 Application Topical TID   propranolol  20 mg Oral Daily   rifaximin  550 mg Oral BID   sodium bicarbonate  1,300 mg Oral BID   Continuous Infusions:   LOS: 6 days   Shon Hale, MD How to contact the [facility] Attending or Consulting provider 7A - 7P or covering provider during after hours 7P -7A, for this patient?  Check the care team in [facility] and look for a) attending/consulting TRH provider listed and b) the [facility] team listed Log into [email or web address] to find provider on call.  Locate the [facility] provider you are looking for under [facility] and page to a number that you can be directly reached. If you still have difficulty reaching the provider, please page the [facility] (Director on Call) for the Hospitalists listed on amion for assistance.  01/16/2023, 6:29 PM

## 2023-01-17 DIAGNOSIS — K7581 Nonalcoholic steatohepatitis (NASH): Secondary | ICD-10-CM | POA: Diagnosis not present

## 2023-01-17 DIAGNOSIS — K7460 Unspecified cirrhosis of liver: Secondary | ICD-10-CM | POA: Diagnosis not present

## 2023-01-17 DIAGNOSIS — K7682 Hepatic encephalopathy: Secondary | ICD-10-CM

## 2023-01-17 DIAGNOSIS — K652 Spontaneous bacterial peritonitis: Secondary | ICD-10-CM | POA: Diagnosis not present

## 2023-01-17 LAB — GLUCOSE, CAPILLARY
Glucose-Capillary: 121 mg/dL — ABNORMAL HIGH (ref 70–99)
Glucose-Capillary: 144 mg/dL — ABNORMAL HIGH (ref 70–99)
Glucose-Capillary: 185 mg/dL — ABNORMAL HIGH (ref 70–99)
Glucose-Capillary: 206 mg/dL — ABNORMAL HIGH (ref 70–99)
Glucose-Capillary: 235 mg/dL — ABNORMAL HIGH (ref 70–99)
Glucose-Capillary: 94 mg/dL (ref 70–99)

## 2023-01-17 MED ORDER — LACTULOSE 10 GM/15ML PO SOLN
10.0000 g | Freq: Three times a day (TID) | ORAL | Status: DC
  Administered 2023-01-17 – 2023-01-19 (×6): 10 g via ORAL
  Filled 2023-01-17 (×6): qty 30

## 2023-01-17 MED ORDER — INSULIN ASPART 100 UNIT/ML IJ SOLN
0.0000 [IU] | Freq: Three times a day (TID) | INTRAMUSCULAR | Status: DC
  Administered 2023-01-18: 3 [IU] via SUBCUTANEOUS
  Administered 2023-01-18: 2 [IU] via SUBCUTANEOUS
  Administered 2023-01-18: 5 [IU] via SUBCUTANEOUS
  Administered 2023-01-18 – 2023-01-19 (×2): 3 [IU] via SUBCUTANEOUS
  Administered 2023-01-19: 5 [IU] via SUBCUTANEOUS

## 2023-01-17 MED ORDER — TORSEMIDE 20 MG PO TABS
20.0000 mg | ORAL_TABLET | Freq: Every day | ORAL | Status: DC
  Administered 2023-01-17 – 2023-01-19 (×3): 20 mg via ORAL
  Filled 2023-01-17 (×3): qty 1

## 2023-01-17 NOTE — Progress Notes (Addendum)
PROGRESS NOTE  [name]  XBJ:[id number] DOB: 06-17-1950 DOA: 01/10/2023 PCP: Brandy Stabile, MD   Chief Complaint  Patient presents with   Altered Mental Status   Level of care: Med-Surg  Brief Admission History:  72 y.o. female with medical history significant for liver cirrhosis, diabetes mellitus, hypertension, CKD.  Patient was brought to the ED with reports of confusion, progressive abdominal and leg swelling, and vomiting, slow to respond.  Family is at bedside and provides the history.  At the time of my evaluation patient is awake, lethargic, not answering questions.  Family reports overnight patient had multiple episodes of vomiting, and has barely had any oral intake for the past couple of days.  No diarrhea.  She has never been on lactulose or rifaximin.  She is due for paracentesis 10/14, she gets this every 9 to 10 days.   ED Course: Temperature down to 94.3, improved with warming blanket.  Heart rate 69 to 90s.  Respirate rate 18-24.  Blood pressure systolic 102-130.  O2 sats greater than 95% on room air.  Ammonia 130.  WBC 7.  VBG shows pH of 7.4, pCO2 of 35.  Troponin 9 x 2.  UA not suggestive of UTI.  Head CT negative for acute abnormality.  Lactulose 30 g given in ED.  Hospitalist admit for acute hepatic encephalopathy.   01/17/23 -Patient remains medically stable for discharge to SNF rehab when insurance authorization can be obtained -Plan is for palliative paracentesis on 01/18/2023  Assessment and Plan: Acute hepatic encephalopathy No prior history of hepatic encephalopathy.  Ammonia level elevated at 130.  Head CT negative for acute abnormality.  She also appears dehydrated.  Need to rule out SBP. -Improved with lactulose enema transition to oral lactulose -IV ceftriaxone x 5 days for SBP -ammonia down to 47  -Status post paracentesis on 01/11/2023 with 5.5L cloudy ascitic fluid removed -lactulose ordered BID to maintain lower ammonia levels (pt says she had not  been on this before) -01/17/23 -Mentation improved significantly and now remains at her prior baseline -    2)SBP ---Ascitic fluid cell count from paracentesis on 01/11/2023 greater than 11,000 -Creatinine and culture negative -- Completed IV ceftriaxone 2 g daily x 5 days started on 01/10/2023 (last dose 01/14/2023) - US guided paracentesis completed 10/14, followed by IV albumin 25 gm  --GI team recommended IV albumin --patient received IV albumin on 01/12/2023 and again on 01/14/2023  (25 g every 6 hours x 4 doses) -- GI team recommends ciprofloxacin renally adjusted dose for SBP prophylaxis - -plans for palliative paracentesis on 01/18/2023  3)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   -Likely prerenal- multiple episodes of vomiting overnight, poor oral intake.  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  Nadolol and propranolol both on home med list. -Resumed home nadolol  -GI team recommends IV albumin 25 g every 6 hours x 4 doses on 01/14/2023 -Recommends Rocephin 2 g x 5 days for SBP prophylaxis started on 01/11/2023 -Lactulose reduced to 10 g twice daily-goal of 2-3 soft BM per day -c/n Propranolol   -Continue Rifaximin while in the hospital however per GI team do  nNot discharge patient on rifaximin -Continue midodrine  -Advance diet as tolerated -- GI team recommends ciprofloxacin renally adjusted dose for SBP prophylaxis -  6)Hypertension with renal disease BP stable, following  7)Type 2 diabetes mellitus with diabetic chronic  kidney disease (HCC) -A1c 6.7 reflecting good diabetic control PTA -Use Novolog/Humalog Sliding scale insulin with Accu-Cheks/Fingersticks as ordered   8)AKI----acute kidney injury on CKD IV -Creatinine peaked at 2.77 this admission -Currently is back down to recent baseline -Baseline creatinine was 1.87 on 12/09/2022 - renally adjust medications, avoid nephrotoxic agents / dehydration  / hypotension  9)Generalized Weakness and Deconditioning--- physical therapy eval appreciated --recommends SNF rehab  DVT prophylaxis: SCDs Code Status: DNR  Family Communication:  updated son Disposition: ---Patient is medically stable for discharge to SNF rehab when insurance authorization can be obtained   Consultants:  GI  Procedures:  Paracentesis 10/14, albumin IV given  Antimicrobials:  Ceftriaxone 2 gm IV 10/13>>   Subjective: -- 01/17/23 -Patient remains medically stable for discharge to SNF rehab when insurance authorization can be obtained -Oral intake is fair -Having BM - I called and updated patient's son [name] at patient's request  Objective: Vitals:   01/16/23 1428 01/16/23 2118 01/17/23 0418 01/17/23 0830  BP: (!) 134/48 (!) 128/51 (!) 105/46 (!) 96/37  Pulse: (!) 53 (!) 58 63 (!) 58  Resp: 20 18 18    Temp: 98.3 F (36.8 C) 98.1 F (36.7 C) 97.9 F (36.6 C)   TempSrc: Oral Oral Oral   SpO2: 100% 100% 100%   Weight:      Height:        Intake/Output Summary (Last 24 hours) at 01/17/2023 1311 Last data filed at 01/17/2023 1049 Gross per 24 hour  Intake 240 ml  Output --  Net 240 ml   Filed Weights   01/10/23 1244 01/10/23 1808  Weight: 96.2 kg 96.2 kg   Physical Exam Gen:- Awake Alert, in no acute distress  HEENT:- [place].AT,  +ve sclera icterus Neck-Supple Neck,No JVD,.  Lungs-  CTAB , fair air movement bilaterally  CV- S1, S2 normal, RRR Abd-  +ve B.Sounds, Abd Soft, No significant tenderness,  +ve Ascites Extremity- good pedal pulses  Psych-affect is  appropriate, oriented x3, a lot more coherent Neuro-generalized weakness, no new focal deficits, no tremors  Data Reviewed: I have personally reviewed following labs and imaging studies  CBC: Recent Labs  Lab 01/12/23 0424 01/13/23 0407 01/14/23 0448 01/15/23 0453 01/16/23 0452  WBC 6.2 6.5 7.8 6.4 6.5  NEUTROABS 4.9 4.7 5.5 4.5 4.7  HGB 8.2* 8.7* 9.1* 9.2* 8.9*  HCT 25.3* 26.6* 28.8* 28.5* 28.7*  MCV 100.4* 101.5* 102.9* 102.2* 104.7*  PLT 32* 36* 42* 39* 34*   Basic Metabolic Panel: Recent Labs  Lab 01/11/23 0407 01/12/23 0424 01/13/23 0407 01/14/23 0448 01/15/23 0453  NA 140 141 142 140 140  K 2.7* 2.9* 2.8* 3.4* 4.0  CL 109 112* 113* 110 111  CO2 20* 20* 22 21* 20*  GLUCOSE 189* 118* 162* 85 123*  BUN 77* 71* 71* 66* 62*  CREATININE 2.77* 2.34* 2.10* 1.92* 1.89*  CALCIUM 8.2* 7.7* 7.9* 7.8* 8.3*  MG  --   --   --  2.0  --   PHOS  --   --   --  2.5 3.1   CBG: Recent Labs  Lab 01/16/23 2004 01/17/23 0006 01/17/23 0416 01/17/23 0747 01/17/23 1147  GLUCAP 176* 144* 121* 94 185*    Recent Results (from the past 240  hour(s))  Gram stain     Status: None   Collection Time: 01/11/23 11:45 AM   Specimen: Ascitic  Result Value Ref Range Status   Specimen Description ASCITIC  Final   Special Requests NONE  Final   Gram Stain   Final    WBC PRESENT, PREDOMINANTLY PMN NO ORGANISMS SEEN CYTOSPIN SMEAR Performed at [facility], [address], [place], [place] [address]    Report Status 01/11/2023 FINAL  Final  Culture, body fluid w Gram Stain-bottle     Status: None   Collection Time: 01/11/23 11:45 AM   Specimen: Peritoneal Washings  Result Value Ref Range Status   Specimen Description PERITONEAL ASCITIC  Final   Special Requests NONE  Final   Culture   Final    NO GROWTH 5 DAYS Performed at [facility], [address], [place], [place] [address]    Report Status 01/16/2023 FINAL  Final    Scheduled Meds:  allopurinol  100 mg Oral QPM    ciprofloxacin  500 mg Oral Q breakfast   insulin aspart  0-15 Units Subcutaneous Q4H   lactulose  10 g Oral TID   levothyroxine  125 mcg Oral Q0600   midodrine  10 mg Oral TID WC   nystatin  1 Application Topical TID   propranolol  20 mg Oral Daily   rifaximin  550 mg Oral BID   sodium bicarbonate  1,300 mg Oral BID   torsemide  20 mg Oral Daily   Continuous Infusions:   LOS: 7 days   Shon Hale, MD How to contact the [facility] Attending or Consulting provider 7A - 7P or covering provider during after hours 7P -7A, for this patient?  Check the care team in [facility] and look for a) attending/consulting TRH provider listed and b) the [facility] team listed Log into [email or web address] to find provider on call.  Locate the [facility] provider you are looking for under [facility] and page to a number that you can be directly reached. If you still have difficulty reaching the provider, please page the [facility] (Director on Call) for the Hospitalists listed on amion for assistance.  01/17/2023, 1:11 PM

## 2023-01-17 NOTE — Progress Notes (Signed)
[name], M.D. Gastroenterology & Hepatology   Interval History:  The patient has presented worsening abdominal distention, with discomfort in the left side of her abdomen.  Denies any nausea, vomiting, fever, chills, melena or hematochezia.  Inpatient Medications:  Current Facility-Administered Medications:    acetaminophen (TYLENOL) tablet 650 mg, 650 mg, Oral, Q6H PRN **OR** acetaminophen (TYLENOL) suppository 650 mg, 650 mg, Rectal, Q6H PRN, Emokpae, Ejiroghene E, MD   allopurinol (ZYLOPRIM) tablet 100 mg, 100 mg, Oral, QPM, Johnson, Clanford L, MD, 100 mg at 01/16/23 1622   ciprofloxacin (CIPRO) tablet 500 mg, 500 mg, Oral, Q breakfast, Marguerita Merles, Rodrickus Min, MD, 500 mg at 01/17/23 0829   insulin aspart (novoLOG) injection 0-15 Units, 0-15 Units, Subcutaneous, Q4H, Emokpae, Ejiroghene E, MD, 2 Units at 01/17/23 0440   lactulose (CHRONULAC) 10 GM/15ML solution 10 g, 10 g, Oral, BID, Emokpae, Courage, MD, 10 g at 01/17/23 0830   levothyroxine (SYNTHROID) tablet 125 mcg, 125 mcg, Oral, Q0600, Emokpae, Ejiroghene E, MD, 125 mcg at 01/17/23 0440   midodrine (PROAMATINE) tablet 10 mg, 10 mg, Oral, TID WC, Zierle-Ghosh, Asia B, DO, 10 mg at 01/17/23 0830   nystatin (MYCOSTATIN/NYSTOP) topical powder 1 Application, 1 Application, Topical, TID, Laural Benes, Clanford L, MD, 1 Application at 01/17/23 0831   promethazine (PHENERGAN) tablet 12.5 mg, 12.5 mg, Oral, Q6H PRN, Emokpae, Ejiroghene E, MD   propranolol (INDERAL) tablet 20 mg, 20 mg, Oral, Daily, Johnson, Clanford L, MD, 20 mg at 01/17/23 0830   rifaximin (XIFAXAN) tablet 550 mg, 550 mg, Oral, BID, Harper, Kristen S, PA-C, 550 mg at 01/17/23 0865   sodium bicarbonate tablet 1,300 mg, 1,300 mg, Oral, BID, Emokpae, Ejiroghene E, MD, 1,300 mg at 01/17/23 0830   I/O    Intake/Output Summary (Last 24 hours) at 01/17/2023 1121 Last data filed at 01/17/2023 1049 Gross per 24 hour  Intake 480 ml  Output --  Net 480 ml     Physical  Exam: Temp:  [97.9 F (36.6 C)-98.3 F (36.8 C)] 97.9 F (36.6 C) (10/20 0418) Pulse Rate:  [53-63] 58 (10/20 0830) Resp:  [18-20] 18 (10/20 0418) BP: (96-134)/(37-51) 96/37 (10/20 0830) SpO2:  [100 %] 100 % (10/20 0418)  GENERAL: The patient is AO x3, in no acute distress. HEENT: Head is normocephalic and atraumatic. EOMI are intact. Mouth is well hydrated and without lesions. NECK: Supple. No masses LUNGS: Clear to auscultation. No presence of rhonchi/wheezing/rales. Adequate chest expansion HEART: RRR, normal s1 and s2. ABDOMEN: Soft, nontender, no guarding, no peritoneal signs, moderately distended due to ascites but not tense. BS +. No masses. EXTREMITIES: Without any cyanosis, clubbing, rash, lesions or edema.  No asterixis. NEUROLOGIC: AOx3, no focal motor deficit. SKIN: no jaundice, no rashes  Laboratory Data: CBC:     Component Value Date/Time   WBC 6.5 01/16/2023 0452   RBC 2.74 (L) 01/16/2023 0452   HGB 8.9 (L) 01/16/2023 0452   HCT 28.7 (L) 01/16/2023 0452   PLT 34 (L) 01/16/2023 0452   MCV 104.7 (H) 01/16/2023 0452   MCH 32.5 01/16/2023 0452   MCHC 31.0 01/16/2023 0452   RDW 16.6 (H) 01/16/2023 0452   LYMPHSABS 1.0 01/16/2023 0452   MONOABS 0.5 01/16/2023 0452   EOSABS 0.3 01/16/2023 0452   BASOSABS 0.0 01/16/2023 0452   COAG:  Lab Results  Component Value Date   INR 2.0 (H) 01/13/2023   INR 1.04 01/20/2009    BMP:     Latest Ref Rng & Units 01/15/2023  4:53 AM 01/14/2023    4:48 AM 01/13/2023    4:07 AM  BMP  Glucose 70 - 99 mg/dL 578  85  469   BUN 8 - 23 mg/dL 62  66  71   Creatinine 0.44 - 1.00 mg/dL 6.29  5.28  4.13   Sodium 135 - 145 mmol/L 140  140  142   Potassium 3.5 - 5.1 mmol/L 4.0  3.4  2.8   Chloride 98 - 111 mmol/L 111  110  113   CO2 22 - 32 mmol/L 20  21  22    Calcium 8.9 - 10.3 mg/dL 8.3  7.8  7.9     HEPATIC:     Latest Ref Rng & Units 01/15/2023    4:53 AM 01/14/2023    4:48 AM 01/13/2023    4:07 AM  Hepatic Function   Total Protein 6.5 - 8.1 g/dL   4.7   Albumin 3.5 - 5.0 g/dL 3.2  2.6  2.5   AST 15 - 41 U/L   24   ALT 0 - 44 U/L   18   Alk Phosphatase 38 - 126 U/L   76   Total Bilirubin 0.3 - 1.2 mg/dL   1.0     CARDIAC:  Lab Results  Component Value Date   CKTOTAL 93 01/20/2009   CKMB 1.4 01/20/2009   TROPONINI <0.03 12/02/2015      Imaging: I personally reviewed and interpreted the available labs, imaging and endoscopic files.   Assessment/Plan: [name] is a 72 y.o. year old female with history of NASH cirrhosis, esohpageal varices s/p banding, ascites requiring paracentesis, GERD, diabetes mellitus, hypertension, HLD, CKD, chronic anemia, who was admitted to the hospital after presenting worsening abdominal pain, vomiting and altered mental status.  Patient was found to have SBP on fluid analysis as she had a total of 10,137 PMNs after paracentesis of 5.5 L.  Due to this, she was given antibiotic course for 5 days which she finished on 01/14/2023, as well as albumin per protocol.  She presented significant improvement of her symptoms after finishing this course.  Notably, has presented recurrent abdominal distention due to ascites reaccumulation.  Her diuretics were held in the setting of AKI.  As renal function has improved we will restart her on her home dose of torsemide 20 mg every day but she should undergo therapeutic paracentesis tomorrow.  Will recommend obtaining cell count, Gram stain and culture at the same time to make sure she had adequate response to antibiotics.   Patient was started on ciprofloxacin prophylaxis, renally dose.  This should be given on a daily basis.  Regular doses 500 mg daily.  She will follow-up with her primary gastroenterologist upon discharge as well.   Regarding her episode of hepatic encephalopathy, this has been controlled after infection has been treated.  She should continue with lactulose to achieve 2-3 bowel movements per day.  While hospitalized she  should continue with Xifaxan twice daily but she should not be discharged on this.   -Advance diet as tolerated -Continue ciprofloxacin prophylaxis daily, discussed with pharmacy for renal adjustment (regular dose is 500 mg every day) -Continue lactulose to achieve 2-3 bowel movements per day -Continue Xifaxan 550 mg twice a day -Continue with propranolol 20 mg every day -Restart torsemide 20 mg every day -Proceed with therapeutic paracentesis tomorrow with max removal of fluid of 5 L, will need to check cell count, Gram stain and culture as well -Follow-up with primary  gastroenterologist upon discharge   Brandy Blazing, MD Gastroenterology and Hepatology [facility]

## 2023-01-18 ENCOUNTER — Inpatient Hospital Stay (HOSPITAL_COMMUNITY): Admit: 2023-01-18 | Payer: Medicare Other

## 2023-01-18 ENCOUNTER — Inpatient Hospital Stay (HOSPITAL_COMMUNITY): Payer: Medicare Other

## 2023-01-18 ENCOUNTER — Encounter (HOSPITAL_COMMUNITY): Payer: Self-pay | Admitting: Internal Medicine

## 2023-01-18 DIAGNOSIS — N179 Acute kidney failure, unspecified: Secondary | ICD-10-CM | POA: Diagnosis not present

## 2023-01-18 DIAGNOSIS — K652 Spontaneous bacterial peritonitis: Secondary | ICD-10-CM | POA: Diagnosis not present

## 2023-01-18 DIAGNOSIS — K7682 Hepatic encephalopathy: Secondary | ICD-10-CM | POA: Diagnosis not present

## 2023-01-18 LAB — COMPREHENSIVE METABOLIC PANEL
ALT: 14 U/L (ref 0–44)
AST: 18 U/L (ref 15–41)
Albumin: 2.8 g/dL — ABNORMAL LOW (ref 3.5–5.0)
Alkaline Phosphatase: 78 U/L (ref 38–126)
Anion gap: 8 (ref 5–15)
BUN: 60 mg/dL — ABNORMAL HIGH (ref 8–23)
CO2: 20 mmol/L — ABNORMAL LOW (ref 22–32)
Calcium: 8.1 mg/dL — ABNORMAL LOW (ref 8.9–10.3)
Chloride: 108 mmol/L (ref 98–111)
Creatinine, Ser: 1.97 mg/dL — ABNORMAL HIGH (ref 0.44–1.00)
GFR, Estimated: 27 mL/min — ABNORMAL LOW (ref >60)
Glucose, Bld: 117 mg/dL — ABNORMAL HIGH (ref 70–99)
Potassium: 3.8 mmol/L (ref 3.5–5.1)
Sodium: 136 mmol/L (ref 135–145)
Total Bilirubin: 1.7 mg/dL — ABNORMAL HIGH (ref 0.3–1.2)
Total Protein: 5 g/dL — ABNORMAL LOW (ref 6.5–8.1)

## 2023-01-18 LAB — PROTIME-INR
INR: 1.8 — ABNORMAL HIGH (ref 0.8–1.2)
Prothrombin Time: 21.2 s — ABNORMAL HIGH (ref 11.4–15.2)

## 2023-01-18 LAB — BODY FLUID CELL COUNT WITH DIFFERENTIAL
Eos, Fluid: 0 %
Lymphs, Fluid: 35 %
Monocyte-Macrophage-Serous Fluid: 14 % — ABNORMAL LOW (ref 50–90)
Neutrophil Count, Fluid: 51 % — ABNORMAL HIGH (ref 0–25)
Total Nucleated Cell Count, Fluid: 313 uL (ref 0–1000)

## 2023-01-18 LAB — GRAM STAIN: Gram Stain: NONE SEEN

## 2023-01-18 LAB — GLUCOSE, CAPILLARY
Glucose-Capillary: 133 mg/dL — ABNORMAL HIGH (ref 70–99)
Glucose-Capillary: 239 mg/dL — ABNORMAL HIGH (ref 70–99)
Glucose-Capillary: 151 mg/dL — ABNORMAL HIGH (ref 70–99)
Glucose-Capillary: 185 mg/dL — ABNORMAL HIGH (ref 70–99)

## 2023-01-18 LAB — CBC
HCT: 27.5 % — ABNORMAL LOW (ref 36.0–46.0)
Hemoglobin: 8.6 g/dL — ABNORMAL LOW (ref 12.0–15.0)
MCH: 32.6 pg (ref 26.0–34.0)
MCHC: 31.3 g/dL (ref 30.0–36.0)
MCV: 104.2 fL — ABNORMAL HIGH (ref 80.0–100.0)
Platelets: 38 10*3/uL — ABNORMAL LOW (ref 150–400)
RBC: 2.64 MIL/uL — ABNORMAL LOW (ref 3.87–5.11)
RDW: 16.6 % — ABNORMAL HIGH (ref 11.5–15.5)
WBC: 7.6 10*3/uL (ref 4.0–10.5)
nRBC: 0 % (ref 0.0–0.2)

## 2023-01-18 MED ORDER — ALBUMIN HUMAN 25 % IV SOLN
50.0000 g | Freq: Once | INTRAVENOUS | Status: AC
  Administered 2023-01-18: 50 g via INTRAVENOUS

## 2023-01-18 MED ORDER — LIDOCAINE HCL (PF) 2 % IJ SOLN
10.0000 mL | Freq: Once | INTRAMUSCULAR | Status: AC
  Administered 2023-01-18: 10 mL

## 2023-01-18 MED ORDER — ALBUMIN HUMAN 25 % IV SOLN
INTRAVENOUS | Status: AC
  Filled 2023-01-18: qty 200

## 2023-01-18 NOTE — Progress Notes (Signed)
Patient tolerated left sided paracentesis procedure and 50G of IV albumin well today and 5 Liters of clear yellow ascites removed with labs collected and sent for processing. Patient verbalized understanding of post procedure instructions and transported back to inpatient bed assignment at this time with no acute distress noted. Inpatient nurse [place] given report at this time.

## 2023-01-18 NOTE — Procedures (Signed)
Pre Procedural Dx: Symptomatic Ascites Post Procedural Dx: Same  Successful US guided paracentesis yielding 5 L of serous ascitic fluid. Sample sent to laboratory as requested.  EBL: None  Complications: None immediate  Jay Matty Vanroekel, MD Pager #: [phone]   

## 2023-01-18 NOTE — Progress Notes (Addendum)
Gastroenterology Progress Note    Patient ID: APPIE DAMM; [id number]; [birth year]    Subjective   No overt GI bleeding. Denies abdominal pain, N/V. Sitting up in chair. Feels abdomen is distended and needs a para. Eager to be discharged. Denies any mental status changes or confusion.    Objective   Vital signs in last 24 hours Temp:  [97.8 F (36.6 C)-98.1 F (36.7 C)] 97.9 F (36.6 C) (10/21 0456) Pulse Rate:  [55-60] 55 (10/21 0839) Resp:  [18-20] 20 (10/21 0456) BP: (102-133)/(38-50) 102/38 (10/21 0839) SpO2:  [99 %-100 %] 100 % (10/21 0456) Last BM Date : 01/17/23  Physical Exam General:   Alert and oriented, pleasant, sitting up in chair Head:  Normocephalic and atraumatic. Eyes:  No icterus, sclera clear. Conjuctiva pink.  Abdomen:  Bowel sounds present, sitting up in chair so exam limited but abdomen feels tense upper portions Extremities:  With 1+ lower extremity edema. Neurologic:  Alert and  oriented x4; negative asterixis  Psych:  Alert and cooperative. Normal mood and affect.  Intake/Output from previous day: 10/20 0701 - 10/21 0700 In: 240 [P.O.:240] Out: -  Intake/Output this shift: No intake/output data recorded.  Lab Results  Recent Labs    01/16/23 0452 01/18/23 0429  WBC 6.5 7.6  HGB 8.9* 8.6*  HCT 28.7* 27.5*  PLT 34* 38*   BMET Recent Labs    01/18/23 0429  NA 136  K 3.8  CL 108  CO2 20*  GLUCOSE 117*  BUN 60*  CREATININE 1.97*  CALCIUM 8.1*   LFT Recent Labs    01/18/23 0429  PROT 5.0*  ALBUMIN 2.8*  AST 18  ALT 14  ALKPHOS 78  BILITOT 1.7*   PT/INR Recent Labs    01/18/23 0429  LABPROT 21.2*  INR 1.8*    Studies/Results US Paracentesis  Result Date: 01/11/2023 INDICATION: Abdominal ascites. EXAM: ULTRASOUND GUIDED  PARACENTESIS MEDICATIONS: None. COMPLICATIONS: None immediate. PROCEDURE: Informed written consent was obtained from the patient after a discussion of the risks, benefits and alternatives to  treatment. A timeout was performed prior to the initiation of the procedure. Initial ultrasound scanning demonstrates a large amount of ascites within the left lower abdominal quadrant. The left lower abdomen was prepped and draped in the usual sterile fashion. 1% lidocaine was used for local anesthesia. Following this, a 19 gauge, 10-cm, Yueh catheter was introduced. Initial attempt with the patient in the left lateral decubitus position just below the umbilicus was unsuccessful. We then prepped more to the left and superior. An ultrasound image was saved for documentation purposes. The paracentesis was performed. The catheter was removed and a dressing was applied. The patient tolerated the procedure well without immediate post procedural complication. Patient received post-procedure intravenous albumin; see nursing notes for details. FINDINGS: A total of approximately 5.5 L of yellow fluid was removed. Samples were sent to the laboratory as requested by the clinical team. IMPRESSION: Successful ultrasound-guided paracentesis yielding 5.5 liters of peritoneal fluid. Electronically Signed   By: Marin Roberts M.D.   On: 01/11/2023 13:33   CT Head Wo Contrast  Result Date: 01/10/2023 CLINICAL DATA:  Delirium EXAM: CT HEAD WITHOUT CONTRAST TECHNIQUE: Contiguous axial images were obtained from the base of the skull through the vertex without intravenous contrast. RADIATION DOSE REDUCTION: This exam was performed according to the departmental dose-optimization program which includes automated exposure control, adjustment of the mA and/or kV according to patient size and/or use of iterative reconstruction technique. COMPARISON:  CT head 08/25/22 FINDINGS: Brain: No hemorrhage. No hydrocephalus. No extra-axial fluid collection. No CT evidence of an acute cortical infarct. No mass effect. No mass lesion. Vascular: No hyperdense vessel or unexpected calcification. Skull: Normal. Negative for fracture or focal  lesion. Sinuses/Orbits: No middle ear or mastoid effusion. Paranasal sinuses are clear. Right lens replacement. Orbits are otherwise unremarkable. Other: None. IMPRESSION: No acute intracranial abnormality. Electronically Signed   By: Lorenza Cambridge M.D.   On: 01/10/2023 16:06   US Paracentesis  Result Date: 01/01/2023 INDICATION: Ascites. EXAM: ULTRASOUND GUIDED therapeutic PARACENTESIS MEDICATIONS: None. COMPLICATIONS: None immediate. PROCEDURE: Informed written consent was obtained from the patient after a discussion of the risks, benefits and alternatives to treatment. A timeout was performed prior to the initiation of the procedure. Initial ultrasound scanning demonstrates a moderate amount of ascites within the left lower abdominal quadrant. The right lower abdomen was prepped and draped in the usual sterile fashion. 1% lidocaine was used for local anesthesia. Following this, a paracentesis catheter was introduced. An ultrasound image was saved for documentation purposes. The paracentesis was performed. The catheter was removed and a dressing was applied. The patient tolerated the procedure well without immediate post procedural complication. FINDINGS: A total of approximately 4 L of serous fluid was removed. IMPRESSION: Successful ultrasound-guided paracentesis yielding 4 liters of peritoneal fluid. Electronically Signed   By: Lupita Raider M.D.   On: 01/01/2023 11:10   US Paracentesis  Result Date: 12/23/2022 INDICATION: Patient with history of NASH cirrhosis, recurrent ascites. Request for diagnostic and therapeutic paracentesis. EXAM: ULTRASOUND GUIDED DIAGNOSTIC AND THERAPEUTIC PARACENTESIS MEDICATIONS: 7 mL 1% lidocaine. COMPLICATIONS: None immediate. PROCEDURE: Informed written consent was obtained from the patient after a discussion of the risks, benefits and alternatives to treatment. A timeout was performed prior to the initiation of the procedure. Initial ultrasound scanning demonstrates a  large amount of ascites within the left lower abdominal quadrant. The left lower abdomen was prepped and draped in the usual sterile fashion. 1% lidocaine was used for local anesthesia. Following this, a 19 gauge, 10-cm, Yueh catheter was introduced. An ultrasound image was saved for documentation purposes. The paracentesis was performed. The catheter was removed and a dressing was applied. The patient tolerated the procedure well without immediate post procedural complication. Patient received post-procedure intravenous albumin; see nursing notes for details. FINDINGS: A total of approximately 4.0 L of clear yellow fluid was removed. Samples were sent to the laboratory as requested by the clinical team. IMPRESSION: Successful ultrasound-guided paracentesis yielding 4.0 liters of peritoneal fluid. Performed by Lynnette Caffey, PA-C PLAN: The patient has required >/=2 paracenteses in a 30 day period and a formal evaluation by the [facility] has been arranged. Electronically Signed   By: Acquanetta Belling M.D.   On: 12/23/2022 11:48    Assessment  72 y.o. female with a history of decompensated MASH cirrhosis admitted with abdominal pain and encephalopathy in setting of SBP. MELD 3.0 is 24 today.   She received 5 days antibiotic course and albumin per protocol with significant improvement in symptoms. Due to AKI, diuretics ahd to be held.   She will need repeat para today, and we will obtain cell count, culture, cytology. She will also need ongoing SBP prophylaxis as outpatient.  Encephalopathy has resolved. Will need to continue lactulose dosing as outpatient with goal of 2-3 BMs daily.    Plan / Recommendations  Para today with max of 5 liters. Fluid analysis requested along with IV  albumin to be given. Continue Cipro 500 mg daily as outpatient Lactulose TID with goal of 2-3 BMs daily Tosemide 20 mg daily  Would recommend discontinuing propanolol due  to increased risk of mortality in light of SBP Recommend updating EGD as outpatient due to decompensating event and needing to be off non-selective beta blocker Needs to follow-up as outpatient with primary GI Hopeful discharge to SNF today     LOS: 8 days    01/18/2023, 8:41 AM  Gelene Mink, PhD, ANP-BC [facility]

## 2023-01-18 NOTE — Progress Notes (Signed)
Physical Therapy Treatment Patient Details Name: [name] MRN: [id number] DOB: [birth year] Today's Date: 01/18/2023   History of Present Illness [name] is a 72 y.o. female with medical history significant for liver cirrhosis, diabetes mellitus, hypertension, CKD.  Patient was brought to the ED with reports of confusion, progressive abdominal and leg swelling, and vomiting, slow to respond.  Family is at bedside and provides the history.  At the time of my evaluation patient is awake, lethargic, not answering questions.  Family reports overnight patient had multiple episodes of vomiting, and has barely had any oral intake for the past couple of days.  No diarrhea.  She has never been on lactulose or rifaximin.  She is due for paracentesis tomorrow, she gets this every 9 to 10 days.    PT Comments  Patient presents seated in chair (assisted by nursing staff) and agreeable for therapy. Patient had difficulty completing seated marching in place due to c/o increasing BLE hip pain/soreness, demonstrates increased endurance/distance for gait training with slightly unsteady movement without loss of balance and limited for ambulation mostly due to c/o fatigue.  Patient will benefit from continued skilled physical therapy in hospital and recommended venue below to increase strength, balance, endurance for safe ADLs and gait.     If plan is discharge home, recommend the following: A lot of help with bathing/dressing/bathroom;Help with stairs or ramp for entrance;Assistance with cooking/housework;A lot of help with walking and/or transfers   Can travel by private vehicle     No  Equipment Recommendations  None recommended by PT    Recommendations for Other Services       Precautions / Restrictions Precautions Precautions: Fall Restrictions Weight Bearing Restrictions: No     Mobility  Bed Mobility               General bed mobility comments: Presents seated in chair (assisted by  nursing staff)    Transfers Overall transfer level: Needs assistance Equipment used: Rolling walker (2 wheels) Transfers: Sit to/from Stand, Bed to chair/wheelchair/BSC Sit to Stand: Min assist   Step pivot transfers: Min assist       General transfer comment: increased BLE strength for completing sit to stands, transfers    Ambulation/Gait Ambulation/Gait assistance: Min assist Gait Distance (Feet): 40 Feet Assistive device: Rolling walker (2 wheels) Gait Pattern/deviations: Decreased step length - left, Decreased stance time - right, Decreased stride length Gait velocity: decreased     General Gait Details: increased endurance/distance for taking steps in room with slow labored cadence and limited mostly due to c/o fatigue   Stairs             Wheelchair Mobility     Tilt Bed    Modified Rankin (Stroke Patients Only)       Balance Overall balance assessment: Needs assistance Sitting-balance support: Feet supported, No upper extremity supported Sitting balance-Leahy Scale: Fair Sitting balance - Comments: seated in chair   Standing balance support: Reliant on assistive device for balance, During functional activity, Bilateral upper extremity supported Standing balance-Leahy Scale: Fair Standing balance comment: using RW                            Cognition Arousal: Alert Behavior During Therapy: WFL for tasks assessed/performed Overall Cognitive Status: Within Functional Limits for tasks assessed  Exercises General Exercises - Lower Extremity Long Arc Quad: Seated, AROM, Strengthening, Both, 10 reps Hip Flexion/Marching: Seated, Strengthening, Both, 10 reps, AAROM Toe Raises: Seated, AROM, Strengthening, Both, 10 reps Heel Raises: Seated, AROM, Strengthening, Both, 10 reps    General Comments        Pertinent Vitals/Pain Pain Assessment Pain Assessment: Faces Faces Pain  Scale: Hurts little more Pain Location: bilateral hips when completing seated marching Pain Descriptors / Indicators: Sore, Grimacing, Discomfort Pain Intervention(s): Limited activity within patient's tolerance, Monitored during session, Repositioned    Home Living                          Prior Function            PT Goals (current goals can now be found in the care plan section) Acute Rehab PT Goals Patient Stated Goal: return home with family to assist PT Goal Formulation: With patient/family Time For Goal Achievement: 01/21/23 Potential to Achieve Goals: Good Progress towards PT goals: Progressing toward goals    Frequency    Min 3X/week      PT Plan      Co-evaluation              AM-PAC PT "6 Clicks" Mobility   Outcome Measure  Help needed turning from your back to your side while in a flat bed without using bedrails?: A Little Help needed moving from lying on your back to sitting on the side of a flat bed without using bedrails?: A Lot Help needed moving to and from a bed to a chair (including a wheelchair)?: A Little Help needed standing up from a chair using your arms (e.g., wheelchair or bedside chair)?: A Little Help needed to walk in hospital room?: A Lot Help needed climbing 3-5 steps with a railing? : A Lot 6 Click Score: 15    End of Session   Activity Tolerance: Patient tolerated treatment well;Patient limited by fatigue Patient left: in chair;with bed alarm set Nurse Communication: Mobility status PT Visit Diagnosis: Unsteadiness on feet (R26.81);Other abnormalities of gait and mobility (R26.89);Muscle weakness (generalized) (M62.81)     Time: 0981-1914 PT Time Calculation (min) (ACUTE ONLY): 17 min  Charges:    $Therapeutic Activity: 8-22 mins PT General Charges $$ ACUTE PT VISIT: 1 Visit                     2:11 PM, 01/18/23 Ocie Bob, MPT Physical Therapist with [facility] [phone]  office [phone] mobile phone

## 2023-01-18 NOTE — Progress Notes (Signed)
PROGRESS NOTE  [name]  ZOX:[id number] DOB: 1950/05/23 DOA: 01/10/2023 PCP: Benita Stabile, MD   Chief Complaint  Patient presents with   Altered Mental Status   Level of care: Med-Surg  Brief Admission History:  72 y.o. female with medical history significant for liver cirrhosis, diabetes mellitus, hypertension, CKD.  Patient was brought to the ED with reports of confusion, progressive abdominal and leg swelling, and vomiting, slow to respond.  Family is at bedside and provides the history.  At the time of my evaluation patient is awake, lethargic, not answering questions.  Family reports overnight patient had multiple episodes of vomiting, and has barely had any oral intake for the past couple of days.  No diarrhea.  She has never been on lactulose or rifaximin.  She is due for paracentesis 10/14, she gets this every 9 to 10 days.   ED Course: Temperature down to 94.3, improved with warming blanket.  Heart rate 69 to 90s.  Respirate rate 18-24.  Blood pressure systolic 102-130.  O2 sats greater than 95% on room air.  Ammonia 130.  WBC 7.  VBG shows pH of 7.4, pCO2 of 35.  Troponin 9 x 2.  UA not suggestive of UTI.  Head CT negative for acute abnormality.  Lactulose 30 g given in ED.  Hospitalist admit for acute hepatic encephalopathy.   01/18/23 -Patient remains medically stable for discharge to SNF rehab when insurance authorization can be obtained -Oral intake is fair -Having BM - Patient tolerated palliative paracentesis with removal of 5 L of ascitic fluid out on 01/18/2023  Assessment and Plan: Acute hepatic encephalopathy No prior history of hepatic encephalopathy.  Ammonia level elevated at 130.  Head CT negative for acute abnormality.  She also appears dehydrated.  Need to rule out SBP. -Improved with lactulose enema transition to oral lactulose -IV ceftriaxone x 5 days for SBP -ammonia down to 47  -Status post paracentesis on 01/11/2023 with 5.5L cloudy ascitic fluid  removed -lactulose ordered BID to maintain lower ammonia levels (pt says she had not been on this before) -01/18/23 -Mentation improved significantly and now remains at her prior baseline -    2)SBP ---Ascitic fluid cell count from paracentesis on 01/11/2023 greater than 11,000 -Ascitic fluid culture negative -- Completed IV ceftriaxone 2 g daily x 5 days started on 01/10/2023 (last dose 01/14/2023) - US guided paracentesis completed 10/14, followed by IV albumin 25 gm  --GI team recommended IV albumin --patient received IV albumin on 01/12/2023 and again on 01/14/2023  (25 g every 6 hours x 4 doses) -- GI team recommends ciprofloxacin renally adjusted dose for SBP prophylaxis - -had palliative paracentesis on 01/18/2023 -Cell count from ascitic fluid on 01/18/2023-paracentesis is down to just 313 from 11,320 previously back on 01/11/2023  3)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   -Likely prerenal- multiple episodes of vomiting overnight, poor oral intake.  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  Nadolol and propranolol both on home med list. -Resumed home nadolol  -GI team recommends IV albumin 25 g every 6 hours x 4 doses on 01/14/2023 -Recommends Rocephin 2 g x 5 days for  SBP prophylaxis started on 01/11/2023 -Lactulose reduced to 10 g twice daily-goal of 2-3 soft BM per day -c/n Propranolol   -Continue Rifaximin while in the hospital however per GI team do nNot discharge patient on rifaximin -Continue midodrine  -Advance diet as  tolerated -- GI team recommends ciprofloxacin renally adjusted dose for SBP prophylaxis -  6)Hypertension with renal disease BP stable, following  7)Type 2 diabetes mellitus with diabetic chronic kidney disease (HCC) -A1c 6.7 reflecting good diabetic control PTA -Use Novolog/Humalog Sliding scale insulin with Accu-Cheks/Fingersticks as ordered   8)AKI----acute kidney injury on CKD IV -Creatinine peaked at 2.77 this admission -Currently is back down to recent baseline -Baseline creatinine was 1.87 on 12/09/2022 - renally adjust medications, avoid nephrotoxic agents / dehydration  / hypotension  9)Generalized Weakness and Deconditioning--- physical therapy eval appreciated --recommends SNF rehab  DVT prophylaxis: SCDs Code Status: DNR  Family Communication:  updated son Disposition: ---Patient is medically stable for discharge to SNF rehab when insurance authorization can be obtained   Consultants:  GI  Procedures:  Paracentesis 10/14, albumin IV given  Antimicrobials:  Ceftriaxone 2 gm IV 10/13>>   Subjective: -- 01/18/23 -Patient remains medically stable for discharge to SNF rehab when insurance authorization can be obtained -Oral intake is fair -Having BM - Patient tolerated palliative paracentesis with removal of 5 L of ascitic fluid out on 01/18/2023  Objective: Vitals:   01/18/23 1420 01/18/23 1430 01/18/23 1450 01/18/23 1604  BP: 114/68 (!) 132/59 133/69 (!) 123/47  Pulse: (!) 58 60 60 67  Resp: 18 18 16 20   Temp: 98 F (36.7 C)   98.2 F (36.8 C)  TempSrc: Oral   Oral  SpO2: 100% 100% 99% 100%  Weight:      Height:       No intake or output data in the 24 hours ending 01/18/23 1930  Filed Weights   01/10/23 1244 01/10/23 1808  Weight: 96.2 kg 96.2 kg   Physical Exam Gen:- Awake Alert, in no acute distress  HEENT:- [place].AT,  +ve sclera icterus Neck-Supple Neck,No JVD,.  Lungs-  CTAB , fair air movement bilaterally  CV- S1, S2 normal, RRR Abd-  +ve  B.Sounds, Abd Soft, No significant tenderness, improved ascites after paracentesis1 Extremity- good pedal pulses  Psych-affect is appropriate, oriented x3, a lot more coherent Neuro-generalized weakness, no new focal deficits, no tremors  Data Reviewed: I have personally reviewed following labs and imaging studies  CBC: Recent Labs  Lab 01/12/23 0424 01/13/23 0407 01/14/23 0448 01/15/23 0453 01/16/23 0452 01/18/23 0429  WBC 6.2 6.5 7.8 6.4 6.5 7.6  NEUTROABS 4.9 4.7 5.5 4.5 4.7  --   HGB 8.2* 8.7* 9.1* 9.2* 8.9* 8.6*  HCT 25.3* 26.6* 28.8* 28.5* 28.7* 27.5*  MCV 100.4* 101.5* 102.9* 102.2* 104.7* 104.2*  PLT 32* 36* 42* 39* 34* 38*   Basic Metabolic Panel: Recent Labs  Lab 01/12/23 0424 01/13/23 0407 01/14/23 0448 01/15/23 0453 01/18/23 0429  NA 141 142 140 140 136  K 2.9* 2.8* 3.4* 4.0 3.8  CL 112* 113* 110 111 108  CO2 20* 22 21* 20* 20*  GLUCOSE 118* 162* 85 123* 117*  BUN 71* 71* 66* 62* 60*  CREATININE 2.34* 2.10* 1.92* 1.89* 1.97*  CALCIUM 7.7* 7.9* 7.8* 8.3* 8.1*  MG  --   --  2.0  --   --   PHOS  --   --  2.5 3.1  --    CBG: Recent Labs  Lab 01/17/23 1636 01/17/23  2020 01/18/23 0756 01/18/23 1201 01/18/23 1629  GLUCAP 235* 206* 133* 239* 151*    Recent Results (from the past 240 hour(s))  Gram stain     Status: None   Collection Time: 01/11/23 11:45 AM   Specimen: Ascitic  Result Value Ref Range Status   Specimen Description ASCITIC  Final   Special Requests NONE  Final   Gram Stain   Final    WBC PRESENT, PREDOMINANTLY PMN NO ORGANISMS SEEN CYTOSPIN SMEAR Performed at [facility], [address], [place], [place] [address]    Report Status 01/11/2023 FINAL  Final  Culture, body fluid w Gram Stain-bottle     Status: None   Collection Time: 01/11/23 11:45 AM   Specimen: Peritoneal Washings  Result Value Ref Range Status   Specimen Description PERITONEAL ASCITIC  Final   Special Requests NONE  Final   Culture   Final    NO GROWTH 5  DAYS Performed at [facility], [address], [place], [place] [address]    Report Status 01/16/2023 FINAL  Final  Gram stain     Status: None   Collection Time: 01/18/23  2:30 PM   Specimen: Ascitic  Result Value Ref Range Status   Specimen Description ASCITIC  Final   Special Requests NONE  Final   Gram Stain   Final    NO ORGANISMS SEEN CYTOSPIN SMEAR WBC PRESENT,BOTH PMN AND MONONUCLEAR Performed at [facility], [address], [place], [place] [address]    Report Status 01/18/2023 FINAL  Final    Scheduled Meds:  allopurinol  100 mg Oral QPM   ciprofloxacin  500 mg Oral Q breakfast   insulin aspart  0-15 Units Subcutaneous TID WC & HS   lactulose  10 g Oral TID   levothyroxine  125 mcg Oral Q0600   midodrine  10 mg Oral TID WC   nystatin  1 Application Topical TID   propranolol  20 mg Oral Daily   rifaximin  550 mg Oral BID   sodium bicarbonate  1,300 mg Oral BID   torsemide  20 mg Oral Daily   Continuous Infusions:   LOS: 8 days   Shon Hale, MD How to contact the [facility] Attending or Consulting provider 7A - 7P or covering provider during after hours 7P -7A, for this patient?  Check the care team in [facility] and look for a) attending/consulting [facility] provider listed and b) the [facility] team listed Log into [email or web address] to find provider on call.  Locate the [facility] provider you are looking for under [facility] and page to a number that you can be directly reached. If you still have difficulty reaching the provider, please page the [facility] (Director on Call) for the Hospitalists listed on amion for assistance.  01/18/2023, 7:30 PM

## 2023-01-18 NOTE — TOC Progression Note (Signed)
Transition of Care [facility]) - Progression Note    Patient Details  Name: [name] MRN: [id number] Date of Birth: 1950-08-03  Transition of Care [facility]) CM/SW Contact  Dahir Ayer A Maecyn Panning, RN Phone Number: 01/18/2023, 3:45 PM  Clinical Narrative:    Son accepts bed at [facility]. Updated admissions. They can accept once Auth is in. Auth is pending at this time. TOC to follow.   Expected Discharge Plan: Home w Home Health Services Barriers to Discharge: Continued Medical Work up  Expected Discharge Plan and Services In-house Referral: Clinical Social Work   Post Acute Care Choice: Home Health Living arrangements for the past 2 months: Single Family Home                 DME Arranged: N/A DME Agency: NA       HH Arranged: NA HH Agency: NA Date HH Agency Contacted: 01/11/23 Time HH Agency Contacted: [phone] Representative spoke with at [facility] Agency: Morrie Sheldon   Social Determinants of Health (SDOH) Interventions SDOH Screenings   Food Insecurity: No Food Insecurity (01/10/2023)  Housing: Low Risk  (01/10/2023)  Transportation Needs: No Transportation Needs (01/10/2023)  Utilities: Not At Risk (01/10/2023)  Alcohol Screen: Low Risk  (10/30/2019)  Depression (PHQ2-9): Medium Risk (10/30/2019)  Financial Resource Strain: Medium Risk (10/30/2019)  Physical Activity: Inactive (10/30/2019)  Social Connections: Moderately Integrated (10/30/2019)  Stress: No Stress Concern Present (10/30/2019)  Tobacco Use: Low Risk  (01/18/2023)    Readmission Risk Interventions    01/11/2023    8:51 AM 09/04/2022   12:38 PM  Readmission Risk Prevention Plan  Transportation Screening Complete Complete  HRI or Home Care Consult Complete Complete  Social Work Consult for Recovery Care Planning/Counseling Complete Complete  Palliative Care Screening Not Applicable Complete  Medication Review Oceanographer) Complete Complete

## 2023-01-18 NOTE — TOC Transition Note (Addendum)
Transition of Care [facility]) - CM/SW Discharge Note   Patient Details  Name: [name] MRN: [id number] Date of Birth: [birth year]  Transition of Care [facility]) CM/SW Contact:  Ancelmo Hunt A Barth Trella, RN Phone Number: 01/18/2023, 5:51 PM   Clinical Narrative:    Authorization for SNF approved. Amended note: Was informed that patient is being followed by [facility] with a community RNCM. They also have a PAC-RN that follows patients at [facility] where the patient will discharged to..   Final next level of care: Skilled Nursing Facility Barriers to Discharge: Continued Medical Work up   Patient Goals and CMS Choice CMS Medicare.gov Compare Post Acute Care list provided to:: Patient Choice offered to / list presented to : Adult Children  Discharge Placement                         Discharge Plan and Services Additional resources added to the After Visit Summary for   In-house Referral: Clinical Social Work   Post Acute Care Choice: Home Health          DME Arranged: N/A DME Agency: NA       HH Arranged: NA HH Agency: NA Date HH Agency Contacted: 01/11/23 Time HH Agency Contacted: [phone] Representative spoke with at [facility] Agency: Morrie Sheldon  Social Determinants of Health (SDOH) Interventions SDOH Screenings   Food Insecurity: No Food Insecurity (01/10/2023)  Housing: Low Risk  (01/10/2023)  Transportation Needs: No Transportation Needs (01/10/2023)  Utilities: Not At Risk (01/10/2023)  Alcohol Screen: Low Risk  (10/30/2019)  Depression (PHQ2-9): Medium Risk (10/30/2019)  Financial Resource Strain: Medium Risk (10/30/2019)  Physical Activity: Inactive (10/30/2019)  Social Connections: Moderately Integrated (10/30/2019)  Stress: No Stress Concern Present (10/30/2019)  Tobacco Use: Low Risk  (01/18/2023)     Readmission Risk Interventions    01/11/2023    8:51 AM 09/04/2022   12:38 PM  Readmission Risk Prevention Plan  Transportation Screening Complete Complete  HRI  or Home Care Consult Complete Complete  Social Work Consult for Recovery Care Planning/Counseling Complete Complete  Palliative Care Screening Not Applicable Complete  Medication Review Oceanographer) Complete Complete

## 2023-01-18 NOTE — Consult Note (Signed)
[facility] CM Inpatient Consult   01/18/2023  NANINE DYSINGER 1951/02/02 [id number]  Primary Care Provider:  Nita Sells   Patient is currently active with Care Management for chronic disease management services.  Patient has been engaged by a Charity fundraiser.  Our community based plan of care has focused on disease management and community resource support.   Patient will receive a post hospital call and will be evaluated for assessments and disease process education.   Plan: Pt pending discharge to SNF facility. Will alert PAC-RN and active RNCM of pt's discharge disposition pending her discharge.  Inpatient Transition Of Care [TOC] team member to make aware that Care Management following.  Of note, Care Management services does not replace or interfere with any services that are needed or arranged by inpatient [facility] care management team.   For additional questions or referrals please contact:  Elliot Cousin, RN, [facility] Liaison [place]   [facility], Population Health Office Hours MTWF  8:00 am-6:00 pm Direct Dial: [phone] mobile [phone] [Office toll free line] Office Hours are M-F 8:30 - 5 pm [email or web address]

## 2023-01-19 DIAGNOSIS — D696 Thrombocytopenia, unspecified: Secondary | ICD-10-CM | POA: Diagnosis not present

## 2023-01-19 DIAGNOSIS — I8500 Esophageal varices without bleeding: Secondary | ICD-10-CM | POA: Diagnosis not present

## 2023-01-19 DIAGNOSIS — R2689 Other abnormalities of gait and mobility: Secondary | ICD-10-CM | POA: Diagnosis not present

## 2023-01-19 DIAGNOSIS — K652 Spontaneous bacterial peritonitis: Secondary | ICD-10-CM | POA: Diagnosis not present

## 2023-01-19 DIAGNOSIS — R188 Other ascites: Secondary | ICD-10-CM | POA: Diagnosis not present

## 2023-01-19 DIAGNOSIS — E538 Deficiency of other specified B group vitamins: Secondary | ICD-10-CM | POA: Diagnosis not present

## 2023-01-19 DIAGNOSIS — I129 Hypertensive chronic kidney disease with stage 1 through stage 4 chronic kidney disease, or unspecified chronic kidney disease: Secondary | ICD-10-CM | POA: Diagnosis not present

## 2023-01-19 DIAGNOSIS — K7460 Unspecified cirrhosis of liver: Secondary | ICD-10-CM | POA: Diagnosis not present

## 2023-01-19 DIAGNOSIS — D631 Anemia in chronic kidney disease: Secondary | ICD-10-CM | POA: Diagnosis not present

## 2023-01-19 DIAGNOSIS — M6281 Muscle weakness (generalized): Secondary | ICD-10-CM | POA: Diagnosis not present

## 2023-01-19 DIAGNOSIS — I10 Essential (primary) hypertension: Secondary | ICD-10-CM | POA: Diagnosis not present

## 2023-01-19 DIAGNOSIS — K7682 Hepatic encephalopathy: Secondary | ICD-10-CM | POA: Diagnosis not present

## 2023-01-19 DIAGNOSIS — E1122 Type 2 diabetes mellitus with diabetic chronic kidney disease: Secondary | ICD-10-CM | POA: Diagnosis not present

## 2023-01-19 DIAGNOSIS — N184 Chronic kidney disease, stage 4 (severe): Secondary | ICD-10-CM | POA: Diagnosis not present

## 2023-01-19 DIAGNOSIS — D649 Anemia, unspecified: Secondary | ICD-10-CM | POA: Diagnosis not present

## 2023-01-19 DIAGNOSIS — D509 Iron deficiency anemia, unspecified: Secondary | ICD-10-CM | POA: Diagnosis not present

## 2023-01-19 DIAGNOSIS — R278 Other lack of coordination: Secondary | ICD-10-CM | POA: Diagnosis not present

## 2023-01-19 DIAGNOSIS — K7581 Nonalcoholic steatohepatitis (NASH): Secondary | ICD-10-CM | POA: Diagnosis not present

## 2023-01-19 LAB — GLUCOSE, CAPILLARY
Glucose-Capillary: 155 mg/dL — ABNORMAL HIGH (ref 70–99)
Glucose-Capillary: 211 mg/dL — ABNORMAL HIGH (ref 70–99)

## 2023-01-19 MED ORDER — NYSTATIN 100000 UNIT/GM EX POWD: 1.0000 | Freq: Three times a day (TID) | CUTANEOUS | 1 refills | Status: DC

## 2023-01-19 MED ORDER — TORSEMIDE 20 MG PO TABS: 20.0000 mg | ORAL_TABLET | Freq: Every day | ORAL | 2 refills | Status: DC

## 2023-01-19 MED ORDER — LACTULOSE 10 GM/15ML PO SOLN: 10.0000 g | Freq: Three times a day (TID) | ORAL | 4 refills | Status: DC

## 2023-01-19 MED ORDER — CIPROFLOXACIN HCL 500 MG PO TABS: 500.0000 mg | ORAL_TABLET | Freq: Every day | ORAL | 0 refills | Status: DC

## 2023-01-19 NOTE — Progress Notes (Signed)
Mobility Specialist Progress Note:    01/19/23 0945  Mobility  Activity Ambulated with assistance in room;Ambulated with assistance to bathroom  Level of Assistance Moderate assist, patient does 50-74%  Assistive Device Front wheel walker  Distance Ambulated (ft) 40 ft  Range of Motion/Exercises Active;All extremities  Activity Response Tolerated well  Mobility Referral Yes  $Mobility charge 1 Mobility  Mobility Specialist Start Time (ACUTE ONLY) 0945  Mobility Specialist Stop Time (ACUTE ONLY) 1005  Mobility Specialist Time Calculation (min) (ACUTE ONLY) 20 min   Pt received in chair, agreeable to mobility. Required ModA to stand and ambulate with RW. Tolerated well, asx throughout. Returned pt to chair, call bell in rreach. All needs met.   Lawerance Bach Mobility Specialist Please contact via Special educational needs teacher or  Rehab office at [phone]

## 2023-01-19 NOTE — TOC Transition Note (Signed)
Transition of Care [facility]) - CM/SW Discharge Note   Patient Details  Name: [name] MRN: [id number] Date of Birth: 25-Jun-1950  Transition of Care [facility]) CM/SW Contact:  Karn Cassis, LCSW Phone Number: 01/19/2023, 2:42 PM   Clinical Narrative:  Pt d/c today to [facility]. Pt, son, and facility aware and agreeable. D/C summary sent to SNF. Authorization received. Discussed transportation with son who was agreeable to [facility]. LCSW also discussed with pt in room and provided options. Pt states not family is available to transport her. She understands that she does not qualify for EMS transport since she ambulated 5' and would possibly receive a bill. Pt agrees to [facility] after discussion and signed rider waiver which was put on chart. Transportation arranged with Juel Burrow. RN given number to call report.      Final next level of care: Skilled Nursing Facility Barriers to Discharge: Barriers Resolved   Patient Goals and CMS Choice CMS Medicare.gov Compare Post Acute Care list provided to:: Patient Choice offered to / list presented to : Adult Children  Discharge Placement                  Patient to be transferred to facility by: [facility] Name of family member notified: [name]- son Patient and family notified of of transfer: 01/19/23  Discharge Plan and Services Additional resources added to the After Visit Summary for   In-house Referral: Clinical Social Work   Post Acute Care Choice: Home Health          DME Arranged: N/A DME Agency: NA       HH Arranged: NA HH Agency: NA Date HH Agency Contacted: 01/11/23 Time HH Agency Contacted: [phone] Representative spoke with at [facility] Agency: Morrie Sheldon  Social Determinants of Health (SDOH) Interventions SDOH Screenings   Food Insecurity: No Food Insecurity (01/10/2023)  Housing: Low Risk  (01/10/2023)  Transportation Needs: No Transportation Needs (01/10/2023)  Utilities: Not At Risk (01/10/2023)  Alcohol Screen: Low Risk   (10/30/2019)  Depression (PHQ2-9): Medium Risk (10/30/2019)  Financial Resource Strain: Medium Risk (10/30/2019)  Physical Activity: Inactive (10/30/2019)  Social Connections: Moderately Integrated (10/30/2019)  Stress: No Stress Concern Present (10/30/2019)  Tobacco Use: Low Risk  (01/18/2023)     Readmission Risk Interventions    01/11/2023    8:51 AM 09/04/2022   12:38 PM  Readmission Risk Prevention Plan  Transportation Screening Complete Complete  HRI or Home Care Consult Complete Complete  Social Work Consult for Recovery Care Planning/Counseling Complete Complete  Palliative Care Screening Not Applicable Complete  Medication Review Oceanographer) Complete Complete

## 2023-01-19 NOTE — Plan of Care (Signed)
  Problem: Safety: Goal: Ability to remain free from injury will improve Outcome: Progressing   Problem: Coping: Goal: Ability to adjust to condition or change in health will improve Outcome: Progressing   Problem: Skin Integrity: Goal: Risk for impaired skin integrity will decrease Outcome: Progressing

## 2023-01-19 NOTE — Discharge Summary (Signed)
[name], is a 72 y.o. female  DOB 07-22-1950  MRN [id number].  Admission date:  01/10/2023  Admitting Physician  Onnie Boer, MD  Discharge Date:  01/19/2023   Primary MD  Benita Stabile, MD  Recommendations for primary care physician for things to follow:  1)Titrate Lactulose to have 2-3 mushy stools per day 2)Please Follow up with Gastroenterologist Dr. Levon Hedger-- address [address], [address], [place] [address],,AOZHY Number [phone]   in 2 to 3 weeks for recheck and reevaluation may need repeat EGD in the near future 3)Repeat CBC, CMP and PT/INR blood tests in 1 week 4) May need to repeat palliative paracentensis from time to time  Admission Diagnosis  Hepatic encephalopathy syndrome (HCC) [K76.82] Thrombocytopenia (HCC) [D69.6] AKI (acute kidney injury) (HCC) [N17.9] 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, K74.60] Acute hepatic encephalopathy (HCC) [K76.82] Acute alteration in mental status [R41.82]   Discharge Diagnosis  Hepatic encephalopathy syndrome (HCC) [K76.82] Thrombocytopenia (HCC) [D69.6] AKI (acute kidney injury) (HCC) [N17.9] 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, K74.60] Acute hepatic encephalopathy (HCC) [K76.82] Acute alteration in mental status [R41.82]    Principal Problem:   Acute hepatic encephalopathy (HCC) Active Problems:   Acute kidney injury superimposed on stage 4 chronic kidney disease (HCC)   Ascites   Liver cirrhosis secondary to NASH (HCC)   History of thrombocytopenia   Type 2 diabetes mellitus with diabetic chronic kidney disease (HCC)   Hypertension with renal disease   SBP (spontaneous bacterial peritonitis) (HCC)   Encephalopathy, hepatic (HCC)   Pressure injury of skin   Hepatic encephalopathy syndrome (HCC)      Past Medical History:  Diagnosis Date   Alkaline phosphatase elevation    Anemia    Anemia  in stage 4 chronic kidney disease (HCC) 10/15/2022   ASD (atrial septal defect)    Cataract    both eyes hx of   Chest pain    03-17-2013 last chest pain   Chronic kidney disease    Stage III kidney disease   Cirrhosis (HCC)    Depression    Diabetes mellitus type II    Family history of adverse reaction to anesthesia    Son hard to wake up   Fatigue    GERD (gastroesophageal reflux disease)    Gout    Headache(784.0)    occasional   Hyperlipidemia    Hypertension    Hypothyroidism    Neuropathy    Osteopenia    Peripheral neuropathy    Presence of pessary    Retinopathy     Past Surgical History:  Procedure Laterality Date   APPENDECTOMY     CATARACT EXTRACTION     CERVICAL SPINE SURGERY  2006   CHOLECYSTECTOMY N/A 04/07/2013   Procedure: LAPAROSCOPIC CHOLECYSTECTOMY WITH INTRAOPERATIVE CHOLANGIOGRAM;  Surgeon: Robyne Askew, MD;  Location: [facility];  Service: General;  Laterality: N/A;   COMBINED HYSTERECTOMY VAGINAL / OOPHORECTOMY / A&P REPAIR  1987   Unilateral oophorectomy, h/o uterine prolapse has  right ovary   ERCP N/A 04/06/2013   Procedure: ENDOSCOPIC RETROGRADE CHOLANGIOPANCREATOGRAPHY (ERCP);  Surgeon: Theda Belfast, MD;  Location: [facility];  Service: Endoscopy;  Laterality: N/A;   ESOPHAGEAL BANDING N/A 09/26/2021   Procedure: ESOPHAGEAL BANDING;  Surgeon: Jeani Hawking, MD;  Location: [facility];  Service: Gastroenterology;  Laterality: N/A;   ESOPHAGEAL BANDING N/A 10/31/2021   Procedure: ESOPHAGEAL BANDING;  Surgeon: Jeani Hawking, MD;  Location: [facility];  Service: Gastroenterology;  Laterality: N/A;   ESOPHAGOGASTRODUODENOSCOPY (EGD) WITH PROPOFOL N/A 08/16/2020   Procedure: ESOPHAGOGASTRODUODENOSCOPY (EGD) WITH PROPOFOL;  Surgeon: Jeani Hawking, MD;  Location: [facility];  Service: Endoscopy;  Laterality: N/A;   ESOPHAGOGASTRODUODENOSCOPY (EGD) WITH PROPOFOL N/A 09/26/2021   Procedure: ESOPHAGOGASTRODUODENOSCOPY (EGD) WITH PROPOFOL;   Surgeon: Jeani Hawking, MD;  Location: [facility];  Service: Gastroenterology;  Laterality: N/A;   ESOPHAGOGASTRODUODENOSCOPY (EGD) WITH PROPOFOL N/A 10/31/2021   Procedure: ESOPHAGOGASTRODUODENOSCOPY (EGD) WITH PROPOFOL;  Surgeon: Jeani Hawking, MD;  Location: [facility];  Service: Gastroenterology;  Laterality: N/A;   EUS N/A 03/31/2013   Procedure: UPPER ENDOSCOPIC ULTRASOUND (EUS) LINEAR;  Surgeon: Theda Belfast, MD;  Location: [facility];  Service: Endoscopy;  Laterality: N/A;   REFRACTIVE SURGERY     SPINAL FUSION     c4-c7   TONSILLECTOMY  age 32    HPI  from the history and physical done on the day of admission:    Chief Complaint: AMS   HPI: [name] is a 72 y.o. female with medical history significant for liver cirrhosis, diabetes mellitus, hypertension, CKD. Patient was brought to the ED with reports of confusion, progressive abdominal and leg swelling, and vomiting, slow to respond.  Family is at bedside and provides the history.  At the time of my evaluation patient is awake, lethargic, not answering questions.  Family reports overnight patient had multiple episodes of vomiting, and has barely had any oral intake for the past couple of days.  No diarrhea.  She has never been on lactulose or rifaximin.  She is due for paracentesis tomorrow, she gets this every 9 to 10 days.   ED Course: Temperature down to 94.3, improved with warming blanket.  Heart rate 69 to 90s.  Respirate rate 18-24.  Blood pressure systolic 102-130.  O2 sats greater than 95% on room air.  Ammonia 130.  WBC 7.  VBG shows pH of 7.4, pCO2 of 35. Troponin 9 x 2. UA not suggestive of UTI. Head CT negative for acute abnormality. Lactulose 30 g given in ED.  Hospitalist admit for acute hepatic encephalopathy.   Review of Systems: Limited Due to altered mental status.[name]   Hospital Course:   1)Acute Hepatic Encephalopathy No prior history of hepatic encephalopathy.  Ammonia level elevated at 130.  Head  CT negative for acute abnormality.  She also appears dehydrated.   -Improved with lactulose enema transitioned to oral lactulose -Completed IV ceftriaxone x 5 days for SBP -ammonia down to 47  -Status post paracentesis on 01/11/2023 with 5.5L cloudy ascitic fluid removed -lactulose ordered BID to maintain lower ammonia levels (pt says she had not been on this before) -Mentation improved significantly and now remains at her prior baseline -Continue lactulose with goal of 2-3 mushy stools per day    2)SBP ---Ascitic fluid cell count from paracentesis on 01/11/2023 greater than 11,000 -Ascitic fluid culture negative -- Completed IV ceftriaxone 2 g daily x 5 days started on 01/10/2023 (last dose 01/14/2023) - US guided paracentesis completed 10/14, followed by IV albumin  25 gm  --GI team recommended IV albumin --patient received IV albumin on 01/12/2023 and again on 01/14/2023  (25 g every 6 hours x 4 doses) -- GI team recommends ciprofloxacin renally adjusted dose for SBP prophylaxis - -had palliative paracentesis on 01/18/2023 -Cell count from ascitic fluid on 01/18/2023-paracentesis is down to just 313 from 11,320 previously back on 01/11/2023 -SBP appears to have resolved after IV Rocephin for 5 days -GI team recommends discharge on prophylactic Cipro 500 daily   3)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  -Continue bicarb tablets -Repeat CMP in a week   5)Liver cirrhosis secondary to NASH NASH liver cirrhosis  complicated by hepatic encephalopathy, thrombocytopenia, and recurrent ascites requiring frequent paracentesis.  Nadolol and propranolol both on home med list. -GI team recommends IV albumin 25 g every 6 hours x 4 doses on 01/14/2023 -Recommends Rocephin 2 g x 5 days for SBP prophylaxis started on 01/11/2023 -Lactulose reduced to 10 g TID-goal of 2-3 soft BM per day -Continue Rifaximin while in the hospital however per GI team do nNot discharge patient on rifaximin -Continue midodrine  -Advance diet as tolerated -- GI team recommends ciprofloxacin renally adjusted dose for SBP prophylaxis - -@GI  team recommends discontinue beta-blocker   6)Hypertension with renal disease BP stable, following   7)Type 2 diabetes mellitus with diabetic chronic kidney disease (HCC) -A1c 6.7 reflecting good diabetic control PTA -Use Novolog/Humalog Sliding scale insulin with Accu-Cheks/Fingersticks as ordered    8)Generalized Weakness and Deconditioning--- physical therapy eval appreciated --recommends SNF rehab   Code Status: DNR  Family Communication: Son [name] is primary contact  disposition: --- SNF rehab Consultants:  GI   Procedures:  Paracentesis 10/14, albumin IV given Paracentesis on 01/18/2023 with 5 L removed   Discharge Condition: Stable  Follow UP   Contact information for follow-up providers     [place] Follow up.   Why: Will contact you to schedule home health visits. Contact information: [address] [place] [address] [phone]         Dolores Frame, MD. Schedule an appointment as soon as possible for a visit in 2 week(s).   Specialty: Gastroenterology Contact information: [address] [place] [address] [phone]              Contact information for after-discharge care     Destination     [facility] Preferred SNF .   Service: Skilled Nursing Contact  information: [address] [place] [address] [phone]                     Consults obtained -Gi  Diet and Activity recommendation:  As advised  Discharge Instructions    Discharge Instructions     Call MD for:  difficulty breathing, headache or visual disturbances   Complete by: As directed    Call MD for:  persistant dizziness or light-headedness   Complete by: As directed    Call MD for:  persistant nausea and vomiting  Complete by: As directed    Call MD for:  temperature >100.4   Complete by: As directed    Diet - low sodium heart healthy   Complete by: As directed    Discharge instructions   Complete by: As directed    1)Titrate Lactulose to have 2-3 mushy stools per day 2)Please Follow up with Gastroenterologist Dr. Levon Hedger-- address [address], [place] [address],,KVQQV Number [phone]   in 2 to 3 weeks for recheck and reevaluation may need repeat EGD in the near future 3)Repeat CBC, CMP and PT/INR blood tests in 1 week 4) May need to repeat palliative paracentensis from time to time   Discharge wound care:   Complete by: As directed    As above   Increase activity slowly   Complete by: As directed        Discharge Medications     Allergies as of 01/19/2023       Reactions   Bactrim [sulfamethoxazole-trimethoprim] Nausea And Vomiting, Other (See Comments)   Dizziness   Cipro [ciprofloxacin Hcl] Other (See Comments)   Severe yeast infection   Codeine Other (See Comments)   Unknown reaction   Cozaar [losartan Potassium] Cough   Lipitor [atorvastatin] Other (See Comments)   Arthralgias Myalgias   Mevacor [lovastatin] Other (See Comments)   Arthralgias  Myalgias   Pravachol [pravastatin] Other (See Comments)   Arthralgias  Myalgias    Rosuvastatin Diarrhea, Other (See Comments)   Arthralgias Myalgias   Statins Other (See Comments)   Arthralgias Myalgias   Welchol [colesevelam] Other  (See Comments)   Arthralgias Myalgias    Zestril [lisinopril] Cough   Zetia [ezetimibe] Other (See Comments)   Arthralgias Myalgias    Dilaudid [hydromorphone] Nausea And Vomiting, Other (See Comments)   GI Intolerance        Medication List     STOP taking these medications    acetaminophen 325 MG tablet Commonly known as: TYLENOL   allopurinol 100 MG tablet Commonly known as: ZYLOPRIM   benzonatate 200 MG capsule Commonly known as: TESSALON   nadolol 40 MG tablet Commonly known as: CORGARD   propranolol 40 MG tablet Commonly known as: INDERAL       TAKE these medications    ciprofloxacin 500 MG tablet Commonly known as: CIPRO Take 1 tablet (500 mg total) by mouth daily with breakfast. Start taking on: January 20, 2023   lactulose 10 GM/15ML solution Commonly known as: CHRONULAC Take 15 mLs (10 g total) by mouth 3 (three) times daily. Titrate to have 2-3 mushy stools per day   levothyroxine 125 MCG tablet Commonly known as: SYNTHROID Take 125 mcg by mouth daily before breakfast.   NovoLIN N ReliOn 100 UNIT/ML injection Generic drug: insulin NPH Human Inject 16 Units into the skin in the morning and at bedtime.   NovoLIN R ReliOn 100 units/mL injection Generic drug: insulin regular Inject 5 Units into the skin 3 (three) times daily before meals.   nystatin powder Commonly known as: MYCOSTATIN/NYSTOP Apply 1 Application topically 3 (three) times daily.   ondansetron 4 MG disintegrating tablet Commonly known as: ZOFRAN-ODT Take 4 mg by mouth every 8 (eight) hours as needed.   sodium bicarbonate 650 MG tablet Take 2 tablets (1,300 mg total) by mouth 2 (two) times daily.   torsemide 20 MG tablet Commonly known as: DEMADEX Take 1 tablet (20 mg total) by mouth daily. What changed: when to take this   Vitamin D (Ergocalciferol) 1.25  MG (50000 UNIT) Caps capsule Commonly known as: DRISDOL Take 50,000 Units by mouth every Wednesday.                Discharge Care Instructions  (From admission, onward)           Start     Ordered   01/19/23 0000  Discharge wound care:       Comments: As above   01/19/23 1205           Major procedures and Radiology Reports - PLEASE review detailed and final reports for all details, in brief -   US Paracentesis  Result Date: 01/18/2023 INDICATION: History of cirrhosis, now with recurrent symptomatic ascites. Please perform ultrasound-guided paracentesis for diagnostic and therapeutic purposes. Maximum volume is 5 L. EXAM: ULTRASOUND-GUIDED PARACENTESIS COMPARISON:  Multiple previous ultrasound-guided paracenteses, most recently on 01/11/2023 yielding 5.5 L of peritoneal fluid. MEDICATIONS: None. COMPLICATIONS: None immediate. TECHNIQUE: Informed written consent was obtained from the patient after a discussion of the risks, benefits and alternatives to treatment. A timeout was performed prior to the initiation of the procedure. Initial ultrasound scanning demonstrates a moderate-to-large amount of ascites within the left lower abdomen which was subsequently prepped and draped in the usual sterile fashion. 1% lidocaine with epinephrine was used for local anesthesia. An ultrasound image was saved for documentation purposed. An 8 Fr Safe-T-Centesis catheter was introduced. The paracentesis was performed. The catheter was removed and a dressing was applied. The patient tolerated the procedure well without immediate post procedural complication. FINDINGS: A total of approximately 5 liters of serous fluid was removed. Samples were sent to the laboratory as requested by the clinical team. IMPRESSION: Successful ultrasound-guided paracentesis yielding 5 liters of peritoneal fluid. Electronically Signed   By: Simonne Come M.D.   On: 01/18/2023 17:07   US Paracentesis  Result Date: 01/11/2023 INDICATION: Abdominal ascites. EXAM: ULTRASOUND GUIDED  PARACENTESIS MEDICATIONS: None. COMPLICATIONS: None  immediate. PROCEDURE: Informed written consent was obtained from the patient after a discussion of the risks, benefits and alternatives to treatment. A timeout was performed prior to the initiation of the procedure. Initial ultrasound scanning demonstrates a large amount of ascites within the left lower abdominal quadrant. The left lower abdomen was prepped and draped in the usual sterile fashion. 1% lidocaine was used for local anesthesia. Following this, a 19 gauge, 10-cm, Yueh catheter was introduced. Initial attempt with the patient in the left lateral decubitus position just below the umbilicus was unsuccessful. We then prepped more to the left and superior. An ultrasound image was saved for documentation purposes. The paracentesis was performed. The catheter was removed and a dressing was applied. The patient tolerated the procedure well without immediate post procedural complication. Patient received post-procedure intravenous albumin; see nursing notes for details. FINDINGS: A total of approximately 5.5 L of yellow fluid was removed. Samples were sent to the laboratory as requested by the clinical team. IMPRESSION: Successful ultrasound-guided paracentesis yielding 5.5 liters of peritoneal fluid. Electronically Signed   By: Marin Roberts M.D.   On: 01/11/2023 13:33   CT Head Wo Contrast  Result Date: 01/10/2023 CLINICAL DATA:  Delirium EXAM: CT HEAD WITHOUT CONTRAST TECHNIQUE: Contiguous axial images were obtained from the base of the skull through the vertex without intravenous contrast. RADIATION DOSE REDUCTION: This exam was performed according to the departmental dose-optimization program which includes automated exposure control, adjustment of the mA and/or kV according to patient size and/or use of iterative reconstruction technique. COMPARISON:  CT head 08/25/22  FINDINGS: Brain: No hemorrhage. No hydrocephalus. No extra-axial fluid collection. No CT evidence of an acute cortical infarct. No  mass effect. No mass lesion. Vascular: No hyperdense vessel or unexpected calcification. Skull: Normal. Negative for fracture or focal lesion. Sinuses/Orbits: No middle ear or mastoid effusion. Paranasal sinuses are clear. Right lens replacement. Orbits are otherwise unremarkable. Other: None. IMPRESSION: No acute intracranial abnormality. Electronically Signed   By: Lorenza Cambridge M.D.   On: 01/10/2023 16:06   US Paracentesis  Result Date: 01/01/2023 INDICATION: Ascites. EXAM: ULTRASOUND GUIDED therapeutic PARACENTESIS MEDICATIONS: None. COMPLICATIONS: None immediate. PROCEDURE: Informed written consent was obtained from the patient after a discussion of the risks, benefits and alternatives to treatment. A timeout was performed prior to the initiation of the procedure. Initial ultrasound scanning demonstrates a moderate amount of ascites within the left lower abdominal quadrant. The right lower abdomen was prepped and draped in the usual sterile fashion. 1% lidocaine was used for local anesthesia. Following this, a paracentesis catheter was introduced. An ultrasound image was saved for documentation purposes. The paracentesis was performed. The catheter was removed and a dressing was applied. The patient tolerated the procedure well without immediate post procedural complication. FINDINGS: A total of approximately 4 L of serous fluid was removed. IMPRESSION: Successful ultrasound-guided paracentesis yielding 4 liters of peritoneal fluid. Electronically Signed   By: Lupita Raider M.D.   On: 01/01/2023 11:10   US Paracentesis  Result Date: 12/23/2022 INDICATION: Patient with history of NASH cirrhosis, recurrent ascites. Request for diagnostic and therapeutic paracentesis. EXAM: ULTRASOUND GUIDED DIAGNOSTIC AND THERAPEUTIC PARACENTESIS MEDICATIONS: 7 mL 1% lidocaine. COMPLICATIONS: None immediate. PROCEDURE: Informed written consent was obtained from the patient after a discussion of the risks, benefits and  alternatives to treatment. A timeout was performed prior to the initiation of the procedure. Initial ultrasound scanning demonstrates a large amount of ascites within the left lower abdominal quadrant. The left lower abdomen was prepped and draped in the usual sterile fashion. 1% lidocaine was used for local anesthesia. Following this, a 19 gauge, 10-cm, Yueh catheter was introduced. An ultrasound image was saved for documentation purposes. The paracentesis was performed. The catheter was removed and a dressing was applied. The patient tolerated the procedure well without immediate post procedural complication. Patient received post-procedure intravenous albumin; see nursing notes for details. FINDINGS: A total of approximately 4.0 L of clear yellow fluid was removed. Samples were sent to the laboratory as requested by the clinical team. IMPRESSION: Successful ultrasound-guided paracentesis yielding 4.0 liters of peritoneal fluid. Performed by Lynnette Caffey, PA-C PLAN: The patient has required >/=2 paracenteses in a 30 day period and a formal evaluation by the [facility] has been arranged. Electronically Signed   By: Acquanetta Belling M.D.   On: 12/23/2022 11:48   Micro Results   Recent Results (from the past 240 hour(s))  Gram stain     Status: None   Collection Time: 01/11/23 11:45 AM   Specimen: Ascitic  Result Value Ref Range Status   Specimen Description ASCITIC  Final   Special Requests NONE  Final   Gram Stain   Final    WBC PRESENT, PREDOMINANTLY PMN NO ORGANISMS SEEN CYTOSPIN SMEAR Performed at [facility], [address], [place], [place] [address]    Report Status 01/11/2023 FINAL  Final  Culture, body fluid w Gram Stain-bottle     Status: None   Collection Time: 01/11/23 11:45 AM   Specimen: Peritoneal Washings  Result Value Ref  Range Status   Specimen Description PERITONEAL ASCITIC  Final   Special Requests NONE  Final   Culture    Final    NO GROWTH 5 DAYS Performed at [facility] [address] [place], [place] [address]    Report Status 01/16/2023 FINAL  Final  Gram stain     Status: None   Collection Time: 01/18/23  2:30 PM   Specimen: Ascitic  Result Value Ref Range Status   Specimen Description ASCITIC  Final   Special Requests NONE  Final   Gram Stain   Final    NO ORGANISMS SEEN CYTOSPIN SMEAR WBC PRESENT,BOTH PMN AND MONONUCLEAR Performed at [facility], [address], [place], [place] [address]    Report Status 01/18/2023 FINAL  Final  Culture, body fluid w Gram Stain-bottle     Status: None (Preliminary result)   Collection Time: 01/18/23  2:30 PM   Specimen: Ascitic  Result Value Ref Range Status   Specimen Description ASCITIC  Final   Special Requests BOTTLES DRAWN AEROBIC AND ANAEROBIC 10CC  Final   Culture   Final    NO GROWTH < 24 HOURS Performed at [facility], [address], [place], [place] [address]    Report Status PENDING  Incomplete   Today   Subjective    Wanita Belin today has no new complaints -Having BMs -Eating and drinking well -Patient states she feels much better overall -Mentation appears to be at baseline      Patient has been seen and examined prior to discharge   Objective   Blood pressure (!) 118/58, pulse (!) 57, temperature (!) 97.3 F (36.3 C), resp. rate 18, height 5\' 10"  (1.778 m), weight 96.2 kg, SpO2 100%.   Intake/Output Summary (Last 24 hours) at 01/19/2023 1218 Last data filed at 01/19/2023 0300 Gross per 24 hour  Intake 480 ml  Output --  Net 480 ml   Exam Gen:- Awake Alert, in no acute distress  HEENT:- .AT,  +ve sclera icterus Neck-Supple Neck,No JVD,.  Lungs-  CTAB , fair air movement bilaterally  CV- S1, S2 normal, RRR Abd-  +ve B.Sounds, Abd Soft, No significant tenderness, improved ascites after paracentesis  Extremity- good pedal pulses  Psych-affect is appropriate, oriented x3, a lot more coherent Neuro-generalized  weakness, no new focal deficits, no tremors   Data Review   CBC w Diff:  Lab Results  Component Value Date   WBC 7.6 01/18/2023   HGB 8.6 (L) 01/18/2023   HCT 27.5 (L) 01/18/2023   PLT 38 (L) 01/18/2023   LYMPHOPCT 15 01/16/2023   MONOPCT 8 01/16/2023   EOSPCT 5 01/16/2023   BASOPCT 0 01/16/2023   CMP:  Lab Results  Component Value Date   NA 136 01/18/2023   K 3.8 01/18/2023   CL 108 01/18/2023   CO2 20 (L) 01/18/2023   BUN 60 (H) 01/18/2023   CREATININE 1.97 (H) 01/18/2023   PROT 5.0 (L) 01/18/2023   ALBUMIN 2.8 (L) 01/18/2023   BILITOT 1.7 (H) 01/18/2023   ALKPHOS 78 01/18/2023   AST 18 01/18/2023   ALT 14 01/18/2023   Total Discharge time is about 33 minutes  Shon Hale M.D on 01/19/2023 at 12:18 PM  Go to [email or web address] -  for contact info  [facility] - Office  [phone]

## 2023-01-19 NOTE — Discharge Instructions (Signed)
1)Titrate Lactulose to have 2-3 mushy stools per day 2)Please Follow up with Gastroenterologist Dr. Levon Hedger-- address [address], [address], [place] [address],,YWVPX Number [phone]   in 2 to 3 weeks for recheck and reevaluation may need repeat EGD in the near future 3)Repeat CBC, CMP and PT/INR blood tests in 1 week 4) May need to repeat palliative paracentensis from time to time

## 2023-01-19 NOTE — Care Management Important Message (Signed)
Important Message  Patient Details  Name: [name] MRN: [id number] Date of Birth: 15-Dec-1950   Important Message Given:  Yes - Medicare IM     Corey Harold 01/19/2023, 10:54 AM

## 2023-01-20 ENCOUNTER — Inpatient Hospital Stay: Payer: Medicare Other

## 2023-01-20 ENCOUNTER — Other Ambulatory Visit (HOSPITAL_COMMUNITY): Payer: Self-pay | Admitting: Gastroenterology

## 2023-01-20 ENCOUNTER — Ambulatory Visit (HOSPITAL_COMMUNITY): Payer: Medicare Other

## 2023-01-20 VITALS — BP 118/52 | HR 61 | Temp 96.7°F | Resp 18

## 2023-01-20 DIAGNOSIS — N184 Chronic kidney disease, stage 4 (severe): Secondary | ICD-10-CM | POA: Diagnosis not present

## 2023-01-20 DIAGNOSIS — D631 Anemia in chronic kidney disease: Secondary | ICD-10-CM | POA: Diagnosis not present

## 2023-01-20 DIAGNOSIS — R188 Other ascites: Secondary | ICD-10-CM

## 2023-01-20 DIAGNOSIS — E538 Deficiency of other specified B group vitamins: Secondary | ICD-10-CM | POA: Diagnosis not present

## 2023-01-20 DIAGNOSIS — D696 Thrombocytopenia, unspecified: Secondary | ICD-10-CM | POA: Diagnosis not present

## 2023-01-20 DIAGNOSIS — I10 Essential (primary) hypertension: Secondary | ICD-10-CM | POA: Diagnosis not present

## 2023-01-20 DIAGNOSIS — K7682 Hepatic encephalopathy: Secondary | ICD-10-CM | POA: Diagnosis not present

## 2023-01-20 DIAGNOSIS — D500 Iron deficiency anemia secondary to blood loss (chronic): Secondary | ICD-10-CM

## 2023-01-20 DIAGNOSIS — D649 Anemia, unspecified: Secondary | ICD-10-CM | POA: Diagnosis not present

## 2023-01-20 DIAGNOSIS — M6281 Muscle weakness (generalized): Secondary | ICD-10-CM | POA: Diagnosis not present

## 2023-01-20 DIAGNOSIS — K7460 Unspecified cirrhosis of liver: Secondary | ICD-10-CM | POA: Diagnosis not present

## 2023-01-20 DIAGNOSIS — I129 Hypertensive chronic kidney disease with stage 1 through stage 4 chronic kidney disease, or unspecified chronic kidney disease: Secondary | ICD-10-CM | POA: Diagnosis not present

## 2023-01-20 DIAGNOSIS — E1122 Type 2 diabetes mellitus with diabetic chronic kidney disease: Secondary | ICD-10-CM | POA: Diagnosis not present

## 2023-01-20 DIAGNOSIS — K652 Spontaneous bacterial peritonitis: Secondary | ICD-10-CM | POA: Diagnosis not present

## 2023-01-20 DIAGNOSIS — D509 Iron deficiency anemia, unspecified: Secondary | ICD-10-CM | POA: Diagnosis not present

## 2023-01-20 LAB — CBC
HCT: 29.7 % — ABNORMAL LOW (ref 36.0–46.0)
Hemoglobin: 9.3 g/dL — ABNORMAL LOW (ref 12.0–15.0)
MCH: 32.5 pg (ref 26.0–34.0)
MCHC: 31.3 g/dL (ref 30.0–36.0)
MCV: 103.8 fL — ABNORMAL HIGH (ref 80.0–100.0)
Platelets: 39 10*3/uL — ABNORMAL LOW (ref 150–400)
RBC: 2.86 MIL/uL — ABNORMAL LOW (ref 3.87–5.11)
RDW: 16.8 % — ABNORMAL HIGH (ref 11.5–15.5)
WBC: 6.1 10*3/uL (ref 4.0–10.5)
nRBC: 0 % (ref 0.0–0.2)

## 2023-01-20 LAB — ACID FAST SMEAR (AFB, MYCOBACTERIA): Acid Fast Smear: NEGATIVE

## 2023-01-20 LAB — CYTOLOGY - NON PAP

## 2023-01-20 MED ORDER — EPOETIN ALFA-EPBX 10000 UNIT/ML IJ SOLN
10000.0000 [IU] | Freq: Once | INTRAMUSCULAR | Status: AC
  Administered 2023-01-20: 10000 [IU] via SUBCUTANEOUS
  Filled 2023-01-20: qty 1

## 2023-01-20 NOTE — Progress Notes (Signed)
Val Eagle presents today for Retacrit injection per the provider's orders.  Stable during administration without incident; injection site WNL; see MAR for injection details.  Patient tolerated procedure well and without incident.  No questions or complaints noted at this time.

## 2023-01-20 NOTE — Patient Instructions (Signed)
[facility]  Discharge Instructions: Thank you for choosing [facility] to provide your oncology and hematology care.  If you have a lab appointment with the Cancer Center - please note that after April 8th, 2024, all labs will be drawn in the cancer center.  You do not have to check in or register with the main entrance as you have in the past but will complete your check-in in the cancer center.  Wear comfortable clothing and clothing appropriate for easy access to any Portacath or PICC line.   We strive to give you quality time with your provider. You may need to reschedule your appointment if you arrive late (15 or more minutes).  Arriving late affects you and other patients whose appointments are after yours.  Also, if you miss three or more appointments without notifying the office, you may be dismissed from the clinic at the provider's discretion.      For prescription refill requests, have your pharmacy contact our office and allow 72 hours for refills to be completed.    Today you received the following chemotherapy and/or immunotherapy agents Retacrit      To help prevent nausea and vomiting after your treatment, we encourage you to take your nausea medication as directed.  BELOW ARE SYMPTOMS THAT SHOULD BE REPORTED IMMEDIATELY: *FEVER GREATER THAN 100.4 F (38 C) OR HIGHER *CHILLS OR SWEATING *NAUSEA AND VOMITING THAT IS NOT CONTROLLED WITH YOUR NAUSEA MEDICATION *UNUSUAL SHORTNESS OF BREATH *UNUSUAL BRUISING OR BLEEDING *URINARY PROBLEMS (pain or burning when urinating, or frequent urination) *BOWEL PROBLEMS (unusual diarrhea, constipation, pain near the anus) TENDERNESS IN MOUTH AND THROAT WITH OR WITHOUT PRESENCE OF ULCERS (sore throat, sores in mouth, or a toothache) UNUSUAL RASH, SWELLING OR PAIN  UNUSUAL VAGINAL DISCHARGE OR ITCHING   Items with * indicate a potential emergency and should be followed up as soon as possible or go to the  Emergency Department if any problems should occur.  Please show the CHEMOTHERAPY ALERT CARD or IMMUNOTHERAPY ALERT CARD at check-in to the Emergency Department and triage nurse.  Should you have questions after your visit or need to cancel or reschedule your appointment, please contact [facility] [phone]  and follow the prompts.  Office hours are 8:00 a.m. to 4:30 p.m. Monday - Friday. Please note that voicemails left after 4:00 p.m. may not be returned until the following business day.  We are closed weekends and major holidays. You have access to a nurse at all times for urgent questions. Please call the main number to the clinic [phone] and follow the prompts.  For any non-urgent questions, you may also contact your provider using MyChart. We now offer e-Visits for anyone 18 and older to request care online for non-urgent symptoms. For details visit [email or web address].   Also download the MyChart app! Go to the app store, search "MyChart", open the app, select , and log in with your MyChart username and password.   

## 2023-01-21 ENCOUNTER — Inpatient Hospital Stay: Payer: Medicare Other

## 2023-01-21 DIAGNOSIS — I10 Essential (primary) hypertension: Secondary | ICD-10-CM | POA: Diagnosis not present

## 2023-01-21 DIAGNOSIS — K652 Spontaneous bacterial peritonitis: Secondary | ICD-10-CM | POA: Diagnosis not present

## 2023-01-21 DIAGNOSIS — D696 Thrombocytopenia, unspecified: Secondary | ICD-10-CM | POA: Diagnosis not present

## 2023-01-21 DIAGNOSIS — D649 Anemia, unspecified: Secondary | ICD-10-CM | POA: Diagnosis not present

## 2023-01-21 DIAGNOSIS — K7460 Unspecified cirrhosis of liver: Secondary | ICD-10-CM | POA: Diagnosis not present

## 2023-01-21 DIAGNOSIS — M6281 Muscle weakness (generalized): Secondary | ICD-10-CM | POA: Diagnosis not present

## 2023-01-21 DIAGNOSIS — K7682 Hepatic encephalopathy: Secondary | ICD-10-CM | POA: Diagnosis not present

## 2023-01-21 DIAGNOSIS — E1122 Type 2 diabetes mellitus with diabetic chronic kidney disease: Secondary | ICD-10-CM | POA: Diagnosis not present

## 2023-01-22 DIAGNOSIS — K7682 Hepatic encephalopathy: Secondary | ICD-10-CM | POA: Diagnosis not present

## 2023-01-22 DIAGNOSIS — E1122 Type 2 diabetes mellitus with diabetic chronic kidney disease: Secondary | ICD-10-CM | POA: Diagnosis not present

## 2023-01-22 DIAGNOSIS — D696 Thrombocytopenia, unspecified: Secondary | ICD-10-CM | POA: Diagnosis not present

## 2023-01-22 DIAGNOSIS — D649 Anemia, unspecified: Secondary | ICD-10-CM | POA: Diagnosis not present

## 2023-01-22 DIAGNOSIS — K7460 Unspecified cirrhosis of liver: Secondary | ICD-10-CM | POA: Diagnosis not present

## 2023-01-22 DIAGNOSIS — M6281 Muscle weakness (generalized): Secondary | ICD-10-CM | POA: Diagnosis not present

## 2023-01-22 DIAGNOSIS — K652 Spontaneous bacterial peritonitis: Secondary | ICD-10-CM | POA: Diagnosis not present

## 2023-01-22 DIAGNOSIS — I10 Essential (primary) hypertension: Secondary | ICD-10-CM | POA: Diagnosis not present

## 2023-01-23 LAB — CULTURE, BODY FLUID W GRAM STAIN -BOTTLE: Culture: NO GROWTH

## 2023-01-25 ENCOUNTER — Ambulatory Visit (HOSPITAL_COMMUNITY)
Admission: RE | Admit: 2023-01-25 | Discharge: 2023-01-25 | Disposition: A | Discharge status: Home / Self Care | Payer: Medicare Other | Source: Ambulatory Visit | Attending: Gastroenterology | Admitting: Gastroenterology

## 2023-01-25 ENCOUNTER — Encounter (HOSPITAL_COMMUNITY): Payer: Self-pay

## 2023-01-25 ENCOUNTER — Telehealth: Payer: Self-pay | Admitting: *Deleted

## 2023-01-25 ENCOUNTER — Ambulatory Visit (HOSPITAL_COMMUNITY): Payer: Medicare Other

## 2023-01-25 ENCOUNTER — Telehealth (INDEPENDENT_AMBULATORY_CARE_PROVIDER_SITE_OTHER): Payer: Self-pay | Admitting: *Deleted

## 2023-01-25 DIAGNOSIS — R188 Other ascites: Secondary | ICD-10-CM | POA: Insufficient documentation

## 2023-01-25 MED ORDER — ALBUMIN HUMAN 25 % IV SOLN
25.0000 g | Freq: Once | INTRAVENOUS | Status: AC
  Administered 2023-01-25: 25 g via INTRAVENOUS

## 2023-01-25 MED ORDER — ALBUMIN HUMAN 25 % IV SOLN
INTRAVENOUS | Status: AC
  Filled 2023-01-25: qty 100

## 2023-01-25 NOTE — Telephone Encounter (Signed)
Patient's son Shirl Finnicum [phone]) called in  You saw [name] as inpatient and your note had for patient to follow up with primary GI - discharge summary has patient needs to f/u up with you in 2 weeks - her son called so schedule apt and I advised I would have to check with you before scheduling - please advise  Also she was put on Lactulose 3 times daily while inpatient and now she has bad diarrhea - he had nursing facility stop it and wanting to know what to do

## 2023-01-25 NOTE — Progress Notes (Signed)
Care Coordination Note  01/25/2023 Name: [name] MRN: [id number] DOB: October 11, 1950  [name] is a 72 y.o. year old female who is a primary care patient of Margo Aye, Kathleene Hazel, MD and is actively engaged with the care management team. I reached out to Val Eagle by phone today to assist with re-scheduling a follow up visit with the RN Case Manager  Follow up plan: Unsuccessful telephone outreach attempt made. A HIPAA compliant phone message was left for the patient providing contact information and requesting a return call.   [facility]  Care Coordination Care Guide  Direct Dial: [phone]

## 2023-01-25 NOTE — Progress Notes (Signed)
Patient tolerated left sided paracentesis and 25G of IV albumin well today and 4 Liters of clear yellow ascites removed. Patient verbalized understanding of discharge instructions and left via wheelchair with no acute distress noted.

## 2023-01-25 NOTE — Telephone Encounter (Signed)
Spoke to patient's son - he will call primary GI to schedule apt for his mom

## 2023-01-25 NOTE — Telephone Encounter (Signed)
She is a patient of Dr. Loreta Ave, should call her office to schedule appointment, unless she wants to change her GI provider

## 2023-01-26 ENCOUNTER — Ambulatory Visit (HOSPITAL_COMMUNITY): Payer: Medicare Other

## 2023-01-26 DIAGNOSIS — D649 Anemia, unspecified: Secondary | ICD-10-CM | POA: Diagnosis not present

## 2023-01-26 DIAGNOSIS — K7682 Hepatic encephalopathy: Secondary | ICD-10-CM | POA: Diagnosis not present

## 2023-01-26 DIAGNOSIS — K7460 Unspecified cirrhosis of liver: Secondary | ICD-10-CM | POA: Diagnosis not present

## 2023-01-26 DIAGNOSIS — D696 Thrombocytopenia, unspecified: Secondary | ICD-10-CM | POA: Diagnosis not present

## 2023-01-26 DIAGNOSIS — E1122 Type 2 diabetes mellitus with diabetic chronic kidney disease: Secondary | ICD-10-CM | POA: Diagnosis not present

## 2023-01-26 DIAGNOSIS — I10 Essential (primary) hypertension: Secondary | ICD-10-CM | POA: Diagnosis not present

## 2023-01-26 DIAGNOSIS — M6281 Muscle weakness (generalized): Secondary | ICD-10-CM | POA: Diagnosis not present

## 2023-01-26 DIAGNOSIS — K652 Spontaneous bacterial peritonitis: Secondary | ICD-10-CM | POA: Diagnosis not present

## 2023-01-26 NOTE — Telephone Encounter (Signed)
Ms Mcilhenny's son called back in, he talked to his mom and they prefer to swtich to Dr Levon Hedger as her GI doctor - apt has been schd for 01/28/23

## 2023-01-27 DIAGNOSIS — K7682 Hepatic encephalopathy: Secondary | ICD-10-CM | POA: Diagnosis not present

## 2023-01-27 DIAGNOSIS — D649 Anemia, unspecified: Secondary | ICD-10-CM | POA: Diagnosis not present

## 2023-01-27 DIAGNOSIS — D696 Thrombocytopenia, unspecified: Secondary | ICD-10-CM | POA: Diagnosis not present

## 2023-01-27 DIAGNOSIS — K7460 Unspecified cirrhosis of liver: Secondary | ICD-10-CM | POA: Diagnosis not present

## 2023-01-27 DIAGNOSIS — I10 Essential (primary) hypertension: Secondary | ICD-10-CM | POA: Diagnosis not present

## 2023-01-27 DIAGNOSIS — K652 Spontaneous bacterial peritonitis: Secondary | ICD-10-CM | POA: Diagnosis not present

## 2023-01-27 DIAGNOSIS — E1122 Type 2 diabetes mellitus with diabetic chronic kidney disease: Secondary | ICD-10-CM | POA: Diagnosis not present

## 2023-01-27 DIAGNOSIS — M6281 Muscle weakness (generalized): Secondary | ICD-10-CM | POA: Diagnosis not present

## 2023-01-28 ENCOUNTER — Ambulatory Visit (INDEPENDENT_AMBULATORY_CARE_PROVIDER_SITE_OTHER): Payer: Medicare Other | Admitting: Gastroenterology

## 2023-01-28 ENCOUNTER — Encounter (INDEPENDENT_AMBULATORY_CARE_PROVIDER_SITE_OTHER): Payer: Self-pay | Admitting: Gastroenterology

## 2023-01-28 VITALS — BP 118/78 | HR 89 | Temp 97.5°F | Ht 66.0 in | Wt 194.6 lb

## 2023-01-28 DIAGNOSIS — I8510 Secondary esophageal varices without bleeding: Secondary | ICD-10-CM

## 2023-01-28 DIAGNOSIS — K7682 Hepatic encephalopathy: Secondary | ICD-10-CM | POA: Diagnosis not present

## 2023-01-28 DIAGNOSIS — D649 Anemia, unspecified: Secondary | ICD-10-CM | POA: Diagnosis not present

## 2023-01-28 DIAGNOSIS — M6281 Muscle weakness (generalized): Secondary | ICD-10-CM | POA: Diagnosis not present

## 2023-01-28 DIAGNOSIS — I8500 Esophageal varices without bleeding: Secondary | ICD-10-CM | POA: Diagnosis not present

## 2023-01-28 DIAGNOSIS — I10 Essential (primary) hypertension: Secondary | ICD-10-CM | POA: Diagnosis not present

## 2023-01-28 DIAGNOSIS — R188 Other ascites: Secondary | ICD-10-CM

## 2023-01-28 DIAGNOSIS — Z1159 Encounter for screening for other viral diseases: Secondary | ICD-10-CM

## 2023-01-28 DIAGNOSIS — K7581 Nonalcoholic steatohepatitis (NASH): Secondary | ICD-10-CM | POA: Diagnosis not present

## 2023-01-28 DIAGNOSIS — K652 Spontaneous bacterial peritonitis: Secondary | ICD-10-CM

## 2023-01-28 DIAGNOSIS — E1122 Type 2 diabetes mellitus with diabetic chronic kidney disease: Secondary | ICD-10-CM | POA: Diagnosis not present

## 2023-01-28 DIAGNOSIS — K7460 Unspecified cirrhosis of liver: Secondary | ICD-10-CM

## 2023-01-28 DIAGNOSIS — D696 Thrombocytopenia, unspecified: Secondary | ICD-10-CM | POA: Diagnosis not present

## 2023-01-28 MED ORDER — PROPRANOLOL HCL 20 MG PO TABS: 20.0000 mg | ORAL_TABLET | Freq: Every day | ORAL | 1 refills | Status: DC

## 2023-01-28 NOTE — Progress Notes (Signed)
Katrinka Blazing, M.D. Gastroenterology & Hepatology [facility] Gastroenterology [address] [place], [place] [address]  Primary Care Physician: Benita Stabile, MD [address] [place] [address]  I will communicate my assessment and recommendations to the referring MD via EMR.  Problems: NASH cirrhosis Non-bleeding esophageal varices Ascites requiring repeat paracentesis SBP  History of Present Illness: [name] is a 72 y.o. female with history of NASH cirrhosis, esophageal varices s/p banding, ascites requiring paracentesis, GERD, diabetes mellitus, hypertension, HLD, CKD, chronic anemia,  who presents for follow up after recent hospitalization for SBP.  Patient was admitted to [facility] in mid October after presenting worsening mental status and abdominal distention. Patient was found to have 10,137 PMNs after paracentesis of 5.5 L. She received IV antibiotics for 5 days with resolution pf symptoms. Was discharged on ciprofloxacin for PPX, and was restarted on torsemide 20 mg qday. Patient was a prior patient of Dr. Loreta Ave, but states she wanted to follow up with our group as "Dr. Loreta Ave had a recent accident and won't be available to see patients in the short term".  Patient was discharged on lactulose 3 times daily with a goal of 2-3 bowel movements per day.  Was also advised to continue ciprofloxacin 500 mg every day.  She was also continued on torsemide 20 mg daily in the light of CKD.  Comes to the office with son. Patient reports that she left rehab facility in the morning. She has been nauseated and her appetite has not been the best. States the food in rehab facility was not the best. She feels her abdomen is getting distended again, states she has gained 4 lb in the last 3 days after paracentesis. States her abdomen feels uncomfortable and bloating. States feeling nauseated but not vomiting.  She is having signfiicant swelling of her legs.  The  patient denies having any fever, chills, hematochezia, melena, hematemesis, abdominal distention, abdominal pain, diarrhea, jaundice, pruritus or weight loss.  Last TTE 09/04/22 1. Left ventricular ejection fraction, by estimation, is 60 to 65%. The  left ventricle has normal function. The left ventricle has no regional  wall motion abnormalities. There is mild left ventricular hypertrophy.  Left ventricular diastolic parameters  are indeterminate.   2. Right ventricular systolic function is normal. The right ventricular  size is normal.   3. Left atrial size was mild to moderately dilated.   4. The mitral valve is abnormal. Trivial mitral valve regurgitation. No  evidence of mitral stenosis. Severe mitral annular calcification.   5. The aortic valve has an indeterminant number of cusps. There is  moderate calcification of the aortic valve. Aortic valve regurgitation is  not visualized. No aortic stenosis is present.   6. The inferior vena cava is dilated but collapsibility is not well  visualized.    Cirrhosis related questions: Hematemesis/coffee ground emesis: No Abdominal pain: No Abdominal distention/worsening ascitesYes Fever/chills: No Episodes of confusion/disorientation: Not recently Number of daily bowel movements:4-5 times per day with lactulose TID Taking diuretics?: torsemine 20 mg qday History of variceal bleeding: No Prior history of banding?: Yes Prior episodes of SBP: yes, on ciprofloxacin Last time liver imaging was performed:12/18/20- [place], no masses Last AFP: none available MELD 3.0 score: 12/29/22  - 24 Currently consuming alcohol: No Hepatitis A and B vaccination status: unknown status   Last EGD: 10/31/21 Dr. Elnoria Howard - Large ( > 5 mm) esophageal varices. Incompletely eradicated. Banded. - Portal hypertensive gastropathy. - Normal  examined duodenum.  Last Colonoscopy: 01/18/2017 Dr. Loreta Ave One 8 mm polyp in [facility].  Recommended repeat colonoscopy in 5 years   Past  Medical History: Past Medical History:  Diagnosis Date   Alkaline phosphatase elevation    Anemia    Anemia in stage 4 chronic kidney disease (HCC) 10/15/2022   ASD (atrial septal defect)    Cataract    both eyes hx of   Chest pain    03-17-2013 last chest pain   Chronic kidney disease    Stage III kidney disease   Cirrhosis (HCC)    Depression    Diabetes mellitus type II    Family history of adverse reaction to anesthesia    Son hard to wake up   Fatigue    GERD (gastroesophageal reflux disease)    Gout    Headache(784.0)    occasional   Hyperlipidemia    Hypertension    Hypothyroidism    Neuropathy    Osteopenia    Peripheral neuropathy    Presence of pessary    Retinopathy     Past Surgical History: Past Surgical History:  Procedure Laterality Date   APPENDECTOMY     CATARACT EXTRACTION     CERVICAL SPINE SURGERY  2006   CHOLECYSTECTOMY N/A 04/07/2013   Procedure: LAPAROSCOPIC CHOLECYSTECTOMY WITH INTRAOPERATIVE CHOLANGIOGRAM;  Surgeon: Robyne Askew, MD;  Location: [facility];  Service: General;  Laterality: N/A;   COMBINED HYSTERECTOMY VAGINAL / OOPHORECTOMY / A&P REPAIR  1987   Unilateral oophorectomy, h/o uterine prolapse has right ovary   ERCP N/A 04/06/2013   Procedure: ENDOSCOPIC RETROGRADE CHOLANGIOPANCREATOGRAPHY (ERCP);  Surgeon: Theda Belfast, MD;  Location: [facility];  Service: Endoscopy;  Laterality: N/A;   ESOPHAGEAL BANDING N/A 09/26/2021   Procedure: ESOPHAGEAL BANDING;  Surgeon: Jeani Hawking, MD;  Location: [facility];  Service: Gastroenterology;  Laterality: N/A;   ESOPHAGEAL BANDING N/A 10/31/2021   Procedure: ESOPHAGEAL BANDING;  Surgeon: Jeani Hawking, MD;  Location: [facility];  Service: Gastroenterology;  Laterality: N/A;   ESOPHAGOGASTRODUODENOSCOPY (EGD) WITH PROPOFOL N/A 08/16/2020   Procedure: ESOPHAGOGASTRODUODENOSCOPY (EGD) WITH PROPOFOL;  Surgeon: Jeani Hawking, MD;  Location: [facility];  Service: Endoscopy;  Laterality:  N/A;   ESOPHAGOGASTRODUODENOSCOPY (EGD) WITH PROPOFOL N/A 09/26/2021   Procedure: ESOPHAGOGASTRODUODENOSCOPY (EGD) WITH PROPOFOL;  Surgeon: Jeani Hawking, MD;  Location: [facility];  Service: Gastroenterology;  Laterality: N/A;   ESOPHAGOGASTRODUODENOSCOPY (EGD) WITH PROPOFOL N/A 10/31/2021   Procedure: ESOPHAGOGASTRODUODENOSCOPY (EGD) WITH PROPOFOL;  Surgeon: Jeani Hawking, MD;  Location: [facility];  Service: Gastroenterology;  Laterality: N/A;   EUS N/A 03/31/2013   Procedure: UPPER ENDOSCOPIC ULTRASOUND (EUS) LINEAR;  Surgeon: Theda Belfast, MD;  Location: [facility];  Service: Endoscopy;  Laterality: N/A;   REFRACTIVE SURGERY     SPINAL FUSION     c4-c7   TONSILLECTOMY  age 22    Family History: Family History  Problem Relation Age of Onset   COPD Mother    Lung cancer Father    Diabetes Sister    Cataracts Sister    Insulin resistance Daughter    Insulin resistance Son     Social History: Social History   Tobacco Use  Smoking Status Never  Smokeless Tobacco Never   Social History   Substance and Sexual Activity  Alcohol Use No   Social History   Substance and Sexual Activity  Drug Use No    Allergies: Allergies  Allergen Reactions   Bactrim [Sulfamethoxazole-Trimethoprim] Nausea And Vomiting and Other (See Comments)  Dizziness   Cipro [Ciprofloxacin Hcl] Other (See Comments)    Severe yeast infection   Codeine Other (See Comments)    Unknown reaction   Cozaar [Losartan Potassium] Cough   Lipitor [Atorvastatin] Other (See Comments)    Arthralgias Myalgias   Mevacor [Lovastatin] Other (See Comments)    Arthralgias  Myalgias   Pravachol [Pravastatin] Other (See Comments)    Arthralgias  Myalgias    Rosuvastatin Diarrhea and Other (See Comments)    Arthralgias Myalgias   Statins Other (See Comments)    Arthralgias Myalgias   Welchol [Colesevelam] Other (See Comments)    Arthralgias Myalgias    Zestril [Lisinopril] Cough   Zetia  [Ezetimibe] Other (See Comments)    Arthralgias Myalgias    Dilaudid [Hydromorphone] Nausea And Vomiting and Other (See Comments)    GI Intolerance     Medications: Current Outpatient Medications  Medication Sig Dispense Refill   ciprofloxacin (CIPRO) 500 MG tablet Take 1 tablet (500 mg total) by mouth daily with breakfast. 30 tablet 0   insulin NPH Human (NOVOLIN N RELION) 100 UNIT/ML injection Inject 16 Units into the skin in the morning and at bedtime.     insulin regular (NOVOLIN R RELION) 100 units/mL injection Inject 5 Units into the skin 3 (three) times daily before meals.     lactose free nutrition (BOOST) LIQD Take 237 mLs by mouth 2 (two) times daily between meals.     lactulose (CHRONULAC) 10 GM/15ML solution Take 15 mLs (10 g total) by mouth 3 (three) times daily. Titrate to have 2-3 mushy stools per day 236 mL 4   levothyroxine (SYNTHROID) 125 MCG tablet Take 125 mcg by mouth daily before breakfast.     nystatin (MYCOSTATIN/NYSTOP) powder Apply 1 Application topically 3 (three) times daily. 30 g 1   ondansetron (ZOFRAN-ODT) 4 MG disintegrating tablet Take 4 mg by mouth every 8 (eight) hours as needed.     sodium bicarbonate 650 MG tablet Take 2 tablets (1,300 mg total) by mouth 2 (two) times daily. 120 tablet 4   torsemide (DEMADEX) 20 MG tablet Take 1 tablet (20 mg total) by mouth daily. 30 tablet 2   Vitamin D, Ergocalciferol, (DRISDOL) 1.25 MG (50000 UT) CAPS capsule Take 50,000 Units by mouth every Wednesday.     No current facility-administered medications for this visit.    Review of Systems: GENERAL: negative for malaise, night sweats HEENT: No changes in hearing or vision, no nose bleeds or other nasal problems. NECK: Negative for lumps, goiter, pain and significant neck swelling RESPIRATORY: Negative for cough, wheezing CARDIOVASCULAR: Negative for chest pain, leg swelling, palpitations, orthopnea GI: SEE HPI MUSCULOSKELETAL: Negative for joint pain or swelling,  back pain, and muscle pain. SKIN: Negative for lesions, rash PSYCH: Negative for sleep disturbance, mood disorder and recent psychosocial stressors. HEMATOLOGY Negative for prolonged bleeding, bruising easily, and swollen nodes. ENDOCRINE: Negative for cold or heat intolerance, polyuria, polydipsia and goiter. NEURO: negative for tremor, gait imbalance, syncope and seizures. The remainder of the review of systems is noncontributory.   Physical Exam: BP 118/78 (BP Location: Left Arm, Patient Position: Sitting, Cuff Size: Large)   Pulse 89   Temp (!) 97.5 F (36.4 C) (Temporal)   Ht 5\' 6"  (1.676 m)   Wt 194 lb 9.6 oz (88.3 kg)   BMI 31.41 kg/m  GENERAL: The patient is AO x3, in no acute distress. Sitting in wheelchair. HEENT: Head is normocephalic and atraumatic. EOMI are intact. Mouth is well hydrated and  without lesions. NECK: Supple. No masses LUNGS: Clear to auscultation. No presence of rhonchi/wheezing/rales. Adequate chest expansion HEART: RRR, normal s1 and s2. ABDOMEN: Soft, nontender, no guarding, no peritoneal signs, and nondistended. BS +. No masses. EXTREMITIES: Without any cyanosis, clubbing, rash, lesions. Has +2 edema up to both knees. NEUROLOGIC: AOx3, no focal motor deficit. SKIN: no jaundice, no rashes  Imaging/Labs: as above  I personally reviewed and interpreted the available labs, imaging and endoscopic files.  Impression and Plan: [name] is a 72 y.o. female with history of NASH cirrhosis, esophageal varices s/p banding, ascites requiring paracentesis, GERD, diabetes mellitus, hypertension, HLD, CKD, chronic anemia,  who presents for follow up after recent hospitalization for SBP.  Patient has recovered after recent episode of SBP. She is currently tolerating ciprofloxacin daily, will continue at same dosage for PPX, but may need to readjust if renal function worsens. Son and patient were advised about the importance of medication adherence.  Unfortunately, she has presented recurrent ascites, which has been managed with diuretics that have been limited by her renal function. She should continue with intermittent paracentesis for now.  Will recheck surveillance labs and do imaging/AFP for HCC screening. Eill also check Hep A/B immunity.  Regarding her EV, she is due for surveillance with repeat EGD, especially as she had a decompensating event. Will schedule this now. As she has had adequate BP, she will be restarted on BB for now.  As she had HE recently, I advised to continue lactulose. She had a clear trigger for this, which was the SBP event, which has resolved. Should continue this medication for a goal of 2-3 Bms per day.  Finally, will discuss colonoscopy in upcoming appointment once we have optimized her volume status.  - Will reassess repeat colonoscopy in follow up appointment - Check MELD labs, AFP, hepatitis A/B ab - Schedule liver US - Schedule EGD - Restart propanolol at 20 mg qday - Continue torsemide 20 mg qday for now - Continue with weekly paracentesis for now - Continue ciprofloxacin 500 mg every day, may need to readjust depending on renal function - Decrease lactulose to once a day for a goal of 2-3 bowel movements per day - can increase to twice a day if needed - Reduce salt intake to <2 g per day - Can take Tylenol max of 2 g per day (650 mg q8h) for pain - Avoid NSAIDs for pain - Avoid eating raw oysters/shellfish - Protein shake (Ensure or Boost) every night before going to sleep  All questions were answered.      Katrinka Blazing, MD Gastroenterology and Hepatology [facility]astroenterology

## 2023-01-28 NOTE — Patient Instructions (Addendum)
-   Check blood workup - Schedule liver US - Schedule EGD - Restart propanolol at 20 mg qday - Continue torsemide 20 mg qday for now - Continue with weekly paracentesis for now - Continue ciprofloxacin 500 mg every day, may need to readjust depending on renal function - Decrease lactulose to once a day for a goal of 2-3 bowel movements per day - can increase to twice a day if needed - Reduce salt intake to <2 g per day - Can take Tylenol max of 2 g per day (650 mg q8h) for pain - Avoid NSAIDs for pain - Avoid eating raw oysters/shellfish - Protein shake (Ensure or Boost) every night before going to sleep

## 2023-01-29 ENCOUNTER — Telehealth (INDEPENDENT_AMBULATORY_CARE_PROVIDER_SITE_OTHER): Payer: Self-pay | Admitting: Gastroenterology

## 2023-01-29 DIAGNOSIS — K7581 Nonalcoholic steatohepatitis (NASH): Secondary | ICD-10-CM | POA: Diagnosis not present

## 2023-01-29 DIAGNOSIS — Z794 Long term (current) use of insulin: Secondary | ICD-10-CM | POA: Diagnosis not present

## 2023-01-29 DIAGNOSIS — M8580 Other specified disorders of bone density and structure, unspecified site: Secondary | ICD-10-CM | POA: Diagnosis not present

## 2023-01-29 DIAGNOSIS — K7682 Hepatic encephalopathy: Secondary | ICD-10-CM | POA: Diagnosis not present

## 2023-01-29 DIAGNOSIS — R188 Other ascites: Secondary | ICD-10-CM | POA: Diagnosis not present

## 2023-01-29 DIAGNOSIS — D696 Thrombocytopenia, unspecified: Secondary | ICD-10-CM | POA: Diagnosis not present

## 2023-01-29 DIAGNOSIS — E1122 Type 2 diabetes mellitus with diabetic chronic kidney disease: Secondary | ICD-10-CM | POA: Diagnosis not present

## 2023-01-29 DIAGNOSIS — E039 Hypothyroidism, unspecified: Secondary | ICD-10-CM | POA: Diagnosis not present

## 2023-01-29 DIAGNOSIS — K7460 Unspecified cirrhosis of liver: Secondary | ICD-10-CM | POA: Diagnosis not present

## 2023-01-29 DIAGNOSIS — E1140 Type 2 diabetes mellitus with diabetic neuropathy, unspecified: Secondary | ICD-10-CM | POA: Diagnosis not present

## 2023-01-29 DIAGNOSIS — I129 Hypertensive chronic kidney disease with stage 1 through stage 4 chronic kidney disease, or unspecified chronic kidney disease: Secondary | ICD-10-CM | POA: Diagnosis not present

## 2023-01-29 DIAGNOSIS — Z9889 Other specified postprocedural states: Secondary | ICD-10-CM | POA: Diagnosis not present

## 2023-01-29 DIAGNOSIS — K652 Spontaneous bacterial peritonitis: Secondary | ICD-10-CM | POA: Diagnosis not present

## 2023-01-29 DIAGNOSIS — E785 Hyperlipidemia, unspecified: Secondary | ICD-10-CM | POA: Diagnosis not present

## 2023-01-29 DIAGNOSIS — Z515 Encounter for palliative care: Secondary | ICD-10-CM | POA: Diagnosis not present

## 2023-01-29 DIAGNOSIS — N184 Chronic kidney disease, stage 4 (severe): Secondary | ICD-10-CM | POA: Diagnosis not present

## 2023-01-29 DIAGNOSIS — D631 Anemia in chronic kidney disease: Secondary | ICD-10-CM | POA: Diagnosis not present

## 2023-01-29 DIAGNOSIS — E11319 Type 2 diabetes mellitus with unspecified diabetic retinopathy without macular edema: Secondary | ICD-10-CM | POA: Diagnosis not present

## 2023-01-29 NOTE — Telephone Encounter (Signed)
Left message to return call to schedule EGD and get dates pt can go for [place]

## 2023-02-01 ENCOUNTER — Encounter (HOSPITAL_COMMUNITY): Payer: Self-pay

## 2023-02-01 ENCOUNTER — Ambulatory Visit (HOSPITAL_COMMUNITY)
Admission: RE | Admit: 2023-02-01 | Discharge: 2023-02-01 | Disposition: A | Discharge status: Home / Self Care | Payer: Medicare Other | Source: Ambulatory Visit | Attending: Gastroenterology | Admitting: Gastroenterology

## 2023-02-01 ENCOUNTER — Ambulatory Visit (HOSPITAL_COMMUNITY): Payer: Medicare Other

## 2023-02-01 DIAGNOSIS — K7460 Unspecified cirrhosis of liver: Secondary | ICD-10-CM | POA: Diagnosis not present

## 2023-02-01 DIAGNOSIS — E1140 Type 2 diabetes mellitus with diabetic neuropathy, unspecified: Secondary | ICD-10-CM | POA: Diagnosis not present

## 2023-02-01 DIAGNOSIS — E113519 Type 2 diabetes mellitus with proliferative diabetic retinopathy with macular edema, unspecified eye: Secondary | ICD-10-CM | POA: Diagnosis not present

## 2023-02-01 DIAGNOSIS — K766 Portal hypertension: Secondary | ICD-10-CM | POA: Diagnosis not present

## 2023-02-01 DIAGNOSIS — R188 Other ascites: Secondary | ICD-10-CM | POA: Insufficient documentation

## 2023-02-01 DIAGNOSIS — N1832 Chronic kidney disease, stage 3b: Secondary | ICD-10-CM | POA: Diagnosis not present

## 2023-02-01 DIAGNOSIS — D689 Coagulation defect, unspecified: Secondary | ICD-10-CM | POA: Diagnosis not present

## 2023-02-01 DIAGNOSIS — R571 Hypovolemic shock: Secondary | ICD-10-CM | POA: Diagnosis not present

## 2023-02-01 DIAGNOSIS — I129 Hypertensive chronic kidney disease with stage 1 through stage 4 chronic kidney disease, or unspecified chronic kidney disease: Secondary | ICD-10-CM | POA: Diagnosis not present

## 2023-02-01 DIAGNOSIS — E11319 Type 2 diabetes mellitus with unspecified diabetic retinopathy without macular edema: Secondary | ICD-10-CM | POA: Diagnosis not present

## 2023-02-01 DIAGNOSIS — M8580 Other specified disorders of bone density and structure, unspecified site: Secondary | ICD-10-CM | POA: Diagnosis not present

## 2023-02-01 DIAGNOSIS — E785 Hyperlipidemia, unspecified: Secondary | ICD-10-CM | POA: Diagnosis not present

## 2023-02-01 DIAGNOSIS — Z95828 Presence of other vascular implants and grafts: Secondary | ICD-10-CM | POA: Diagnosis not present

## 2023-02-01 DIAGNOSIS — N390 Urinary tract infection, site not specified: Secondary | ICD-10-CM | POA: Diagnosis not present

## 2023-02-01 DIAGNOSIS — Z1159 Encounter for screening for other viral diseases: Secondary | ICD-10-CM | POA: Diagnosis not present

## 2023-02-01 DIAGNOSIS — Z794 Long term (current) use of insulin: Secondary | ICD-10-CM | POA: Diagnosis not present

## 2023-02-01 DIAGNOSIS — Z515 Encounter for palliative care: Secondary | ICD-10-CM | POA: Diagnosis not present

## 2023-02-01 DIAGNOSIS — I8510 Secondary esophageal varices without bleeding: Secondary | ICD-10-CM | POA: Diagnosis not present

## 2023-02-01 DIAGNOSIS — Z66 Do not resuscitate: Secondary | ICD-10-CM | POA: Diagnosis not present

## 2023-02-01 DIAGNOSIS — D696 Thrombocytopenia, unspecified: Secondary | ICD-10-CM | POA: Diagnosis not present

## 2023-02-01 DIAGNOSIS — E1122 Type 2 diabetes mellitus with diabetic chronic kidney disease: Secondary | ICD-10-CM | POA: Diagnosis not present

## 2023-02-01 DIAGNOSIS — K767 Hepatorenal syndrome: Secondary | ICD-10-CM | POA: Diagnosis not present

## 2023-02-01 DIAGNOSIS — N179 Acute kidney failure, unspecified: Secondary | ICD-10-CM | POA: Diagnosis not present

## 2023-02-01 DIAGNOSIS — G9341 Metabolic encephalopathy: Secondary | ICD-10-CM | POA: Diagnosis not present

## 2023-02-01 DIAGNOSIS — K7581 Nonalcoholic steatohepatitis (NASH): Secondary | ICD-10-CM | POA: Diagnosis not present

## 2023-02-01 DIAGNOSIS — D631 Anemia in chronic kidney disease: Secondary | ICD-10-CM | POA: Diagnosis not present

## 2023-02-01 DIAGNOSIS — Z79899 Other long term (current) drug therapy: Secondary | ICD-10-CM | POA: Diagnosis not present

## 2023-02-01 DIAGNOSIS — K652 Spontaneous bacterial peritonitis: Secondary | ICD-10-CM | POA: Diagnosis not present

## 2023-02-01 DIAGNOSIS — E039 Hypothyroidism, unspecified: Secondary | ICD-10-CM | POA: Diagnosis not present

## 2023-02-01 DIAGNOSIS — E871 Hypo-osmolality and hyponatremia: Secondary | ICD-10-CM | POA: Diagnosis not present

## 2023-02-01 DIAGNOSIS — E872 Acidosis, unspecified: Secondary | ICD-10-CM | POA: Diagnosis not present

## 2023-02-01 DIAGNOSIS — L89316 Pressure-induced deep tissue damage of right buttock: Secondary | ICD-10-CM | POA: Diagnosis not present

## 2023-02-01 DIAGNOSIS — K7682 Hepatic encephalopathy: Secondary | ICD-10-CM | POA: Diagnosis not present

## 2023-02-01 DIAGNOSIS — N170 Acute kidney failure with tubular necrosis: Secondary | ICD-10-CM | POA: Diagnosis not present

## 2023-02-01 DIAGNOSIS — N184 Chronic kidney disease, stage 4 (severe): Secondary | ICD-10-CM | POA: Diagnosis not present

## 2023-02-01 MED ORDER — LIDOCAINE HCL (PF) 2 % IJ SOLN
INTRAMUSCULAR | Status: AC
  Filled 2023-02-01: qty 10

## 2023-02-01 MED ORDER — LIDOCAINE HCL (PF) 2 % IJ SOLN
10.0000 mL | Freq: Once | INTRAMUSCULAR | Status: AC
  Administered 2023-02-01: 10 mL

## 2023-02-01 MED ORDER — ALBUMIN HUMAN 25 % IV SOLN
25.0000 g | Freq: Once | INTRAVENOUS | Status: AC
  Administered 2023-02-01: 25 g via INTRAVENOUS

## 2023-02-01 MED ORDER — ALBUMIN HUMAN 25 % IV SOLN
INTRAVENOUS | Status: AC
  Filled 2023-02-01: qty 100

## 2023-02-01 NOTE — Progress Notes (Signed)
Patient tolerated left sided paracentesis and 25 G of IV albumin well today and 4 Liters of clear yellow ascites removed. Patient verbalized understanding of discharge instructions and left with granddaughter via wheelchair with no acute distress noted.

## 2023-02-01 NOTE — Telephone Encounter (Signed)
Pt son left voicemail to get procedures scheduled.  Returned call to pt son [name] and scheduled EGD for 02/23/23. Instructions will be sent via my chart. Will send pre op appt via my chart  Per ZOX:[id number] or Prior Authorization is not required for the requested services You are not required to submit a notification/prior authorization based on the information provided. If you have general questions about the prior authorization requirements, visit [email or web address] > Clinician Resources > Advance and Admission Notification Requirements. The number above acknowledges your notification. Please write this reference number down for future reference. If you would like to request an organization determination, please call us at [phone]. Decision ID #: [id number]  [place] scheduled for 02/23/23 at 12:30pm

## 2023-02-02 ENCOUNTER — Emergency Department (HOSPITAL_COMMUNITY): Payer: Medicare Other

## 2023-02-02 ENCOUNTER — Encounter (INDEPENDENT_AMBULATORY_CARE_PROVIDER_SITE_OTHER): Payer: Self-pay

## 2023-02-02 ENCOUNTER — Encounter (HOSPITAL_COMMUNITY): Payer: Self-pay | Admitting: *Deleted

## 2023-02-02 ENCOUNTER — Other Ambulatory Visit: Payer: Self-pay

## 2023-02-02 ENCOUNTER — Telehealth (INDEPENDENT_AMBULATORY_CARE_PROVIDER_SITE_OTHER): Payer: Self-pay | Admitting: Gastroenterology

## 2023-02-02 ENCOUNTER — Inpatient Hospital Stay (HOSPITAL_COMMUNITY)
Admission: EM | Admit: 2023-02-02 | Discharge: 2023-02-14 | DRG: 682 | Disposition: A | Discharge status: Hospice | Payer: Medicare Other | Attending: Student | Admitting: Student

## 2023-02-02 DIAGNOSIS — I10 Essential (primary) hypertension: Secondary | ICD-10-CM | POA: Diagnosis not present

## 2023-02-02 DIAGNOSIS — R791 Abnormal coagulation profile: Secondary | ICD-10-CM | POA: Diagnosis not present

## 2023-02-02 DIAGNOSIS — E039 Hypothyroidism, unspecified: Secondary | ICD-10-CM | POA: Diagnosis not present

## 2023-02-02 DIAGNOSIS — D649 Anemia, unspecified: Secondary | ICD-10-CM | POA: Diagnosis not present

## 2023-02-02 DIAGNOSIS — E8770 Fluid overload, unspecified: Secondary | ICD-10-CM | POA: Diagnosis not present

## 2023-02-02 DIAGNOSIS — N1832 Chronic kidney disease, stage 3b: Secondary | ICD-10-CM | POA: Diagnosis not present

## 2023-02-02 DIAGNOSIS — E113519 Type 2 diabetes mellitus with proliferative diabetic retinopathy with macular edema, unspecified eye: Secondary | ICD-10-CM | POA: Diagnosis present

## 2023-02-02 DIAGNOSIS — Z95828 Presence of other vascular implants and grafts: Secondary | ICD-10-CM | POA: Diagnosis not present

## 2023-02-02 DIAGNOSIS — K766 Portal hypertension: Secondary | ICD-10-CM | POA: Diagnosis present

## 2023-02-02 DIAGNOSIS — Z452 Encounter for adjustment and management of vascular access device: Secondary | ICD-10-CM | POA: Diagnosis not present

## 2023-02-02 DIAGNOSIS — Z66 Do not resuscitate: Secondary | ICD-10-CM | POA: Diagnosis not present

## 2023-02-02 DIAGNOSIS — Z79890 Hormone replacement therapy (postmenopausal): Secondary | ICD-10-CM

## 2023-02-02 DIAGNOSIS — E871 Hypo-osmolality and hyponatremia: Secondary | ICD-10-CM | POA: Diagnosis present

## 2023-02-02 DIAGNOSIS — Z794 Long term (current) use of insulin: Secondary | ICD-10-CM

## 2023-02-02 DIAGNOSIS — D6959 Other secondary thrombocytopenia: Secondary | ICD-10-CM | POA: Diagnosis present

## 2023-02-02 DIAGNOSIS — F32A Depression, unspecified: Secondary | ICD-10-CM | POA: Diagnosis present

## 2023-02-02 DIAGNOSIS — K3189 Other diseases of stomach and duodenum: Secondary | ICD-10-CM | POA: Diagnosis present

## 2023-02-02 DIAGNOSIS — Z8249 Family history of ischemic heart disease and other diseases of the circulatory system: Secondary | ICD-10-CM

## 2023-02-02 DIAGNOSIS — K7469 Other cirrhosis of liver: Secondary | ICD-10-CM | POA: Diagnosis not present

## 2023-02-02 DIAGNOSIS — I517 Cardiomegaly: Secondary | ICD-10-CM | POA: Diagnosis not present

## 2023-02-02 DIAGNOSIS — Z6835 Body mass index (BMI) 35.0-35.9, adult: Secondary | ICD-10-CM

## 2023-02-02 DIAGNOSIS — N179 Acute kidney failure, unspecified: Secondary | ICD-10-CM | POA: Diagnosis not present

## 2023-02-02 DIAGNOSIS — K767 Hepatorenal syndrome: Secondary | ICD-10-CM | POA: Diagnosis present

## 2023-02-02 DIAGNOSIS — R630 Anorexia: Secondary | ICD-10-CM | POA: Diagnosis not present

## 2023-02-02 DIAGNOSIS — I8510 Secondary esophageal varices without bleeding: Secondary | ICD-10-CM | POA: Diagnosis present

## 2023-02-02 DIAGNOSIS — Z833 Family history of diabetes mellitus: Secondary | ICD-10-CM

## 2023-02-02 DIAGNOSIS — R54 Age-related physical debility: Secondary | ICD-10-CM | POA: Diagnosis present

## 2023-02-02 DIAGNOSIS — R188 Other ascites: Secondary | ICD-10-CM | POA: Diagnosis present

## 2023-02-02 DIAGNOSIS — R7989 Other specified abnormal findings of blood chemistry: Secondary | ICD-10-CM | POA: Diagnosis not present

## 2023-02-02 DIAGNOSIS — I129 Hypertensive chronic kidney disease with stage 1 through stage 4 chronic kidney disease, or unspecified chronic kidney disease: Secondary | ICD-10-CM | POA: Diagnosis not present

## 2023-02-02 DIAGNOSIS — E559 Vitamin D deficiency, unspecified: Secondary | ICD-10-CM | POA: Diagnosis present

## 2023-02-02 DIAGNOSIS — M8580 Other specified disorders of bone density and structure, unspecified site: Secondary | ICD-10-CM | POA: Diagnosis present

## 2023-02-02 DIAGNOSIS — Z7189 Other specified counseling: Secondary | ICD-10-CM | POA: Diagnosis not present

## 2023-02-02 DIAGNOSIS — G609 Hereditary and idiopathic neuropathy, unspecified: Secondary | ICD-10-CM | POA: Diagnosis present

## 2023-02-02 DIAGNOSIS — R0989 Other specified symptoms and signs involving the circulatory and respiratory systems: Secondary | ICD-10-CM | POA: Diagnosis not present

## 2023-02-02 DIAGNOSIS — L89316 Pressure-induced deep tissue damage of right buttock: Secondary | ICD-10-CM | POA: Diagnosis present

## 2023-02-02 DIAGNOSIS — Z515 Encounter for palliative care: Secondary | ICD-10-CM | POA: Diagnosis not present

## 2023-02-02 DIAGNOSIS — K7460 Unspecified cirrhosis of liver: Secondary | ICD-10-CM | POA: Diagnosis not present

## 2023-02-02 DIAGNOSIS — R571 Hypovolemic shock: Secondary | ICD-10-CM | POA: Diagnosis present

## 2023-02-02 DIAGNOSIS — Z881 Allergy status to other antibiotic agents status: Secondary | ICD-10-CM

## 2023-02-02 DIAGNOSIS — K7682 Hepatic encephalopathy: Secondary | ICD-10-CM | POA: Diagnosis present

## 2023-02-02 DIAGNOSIS — N170 Acute kidney failure with tubular necrosis: Secondary | ICD-10-CM | POA: Diagnosis present

## 2023-02-02 DIAGNOSIS — D631 Anemia in chronic kidney disease: Secondary | ICD-10-CM | POA: Diagnosis not present

## 2023-02-02 DIAGNOSIS — Z961 Presence of intraocular lens: Secondary | ICD-10-CM | POA: Diagnosis present

## 2023-02-02 DIAGNOSIS — Z79899 Other long term (current) drug therapy: Secondary | ICD-10-CM

## 2023-02-02 DIAGNOSIS — E861 Hypovolemia: Secondary | ICD-10-CM | POA: Diagnosis not present

## 2023-02-02 DIAGNOSIS — G9341 Metabolic encephalopathy: Secondary | ICD-10-CM | POA: Diagnosis present

## 2023-02-02 DIAGNOSIS — K7581 Nonalcoholic steatohepatitis (NASH): Secondary | ICD-10-CM | POA: Diagnosis present

## 2023-02-02 DIAGNOSIS — Z5986 Financial insecurity: Secondary | ICD-10-CM

## 2023-02-02 DIAGNOSIS — E66811 Obesity, class 1: Secondary | ICD-10-CM | POA: Diagnosis present

## 2023-02-02 DIAGNOSIS — L89326 Pressure-induced deep tissue damage of left buttock: Secondary | ICD-10-CM | POA: Diagnosis present

## 2023-02-02 DIAGNOSIS — N390 Urinary tract infection, site not specified: Secondary | ICD-10-CM | POA: Diagnosis present

## 2023-02-02 DIAGNOSIS — R932 Abnormal findings on diagnostic imaging of liver and biliary tract: Secondary | ICD-10-CM | POA: Diagnosis not present

## 2023-02-02 DIAGNOSIS — E1110 Type 2 diabetes mellitus with ketoacidosis without coma: Secondary | ICD-10-CM | POA: Diagnosis not present

## 2023-02-02 DIAGNOSIS — D696 Thrombocytopenia, unspecified: Secondary | ICD-10-CM | POA: Diagnosis present

## 2023-02-02 DIAGNOSIS — E1122 Type 2 diabetes mellitus with diabetic chronic kidney disease: Secondary | ICD-10-CM | POA: Diagnosis not present

## 2023-02-02 DIAGNOSIS — D539 Nutritional anemia, unspecified: Secondary | ICD-10-CM | POA: Diagnosis present

## 2023-02-02 DIAGNOSIS — H35033 Hypertensive retinopathy, bilateral: Secondary | ICD-10-CM | POA: Diagnosis present

## 2023-02-02 DIAGNOSIS — M1A0790 Idiopathic chronic gout, unspecified ankle and foot, without tophus (tophi): Secondary | ICD-10-CM | POA: Diagnosis present

## 2023-02-02 DIAGNOSIS — E782 Mixed hyperlipidemia: Secondary | ICD-10-CM | POA: Diagnosis present

## 2023-02-02 DIAGNOSIS — K219 Gastro-esophageal reflux disease without esophagitis: Secondary | ICD-10-CM | POA: Diagnosis present

## 2023-02-02 DIAGNOSIS — D689 Coagulation defect, unspecified: Secondary | ICD-10-CM | POA: Diagnosis present

## 2023-02-02 DIAGNOSIS — Z882 Allergy status to sulfonamides status: Secondary | ICD-10-CM

## 2023-02-02 DIAGNOSIS — Z981 Arthrodesis status: Secondary | ICD-10-CM

## 2023-02-02 DIAGNOSIS — Z888 Allergy status to other drugs, medicaments and biological substances status: Secondary | ICD-10-CM

## 2023-02-02 DIAGNOSIS — E872 Acidosis, unspecified: Secondary | ICD-10-CM | POA: Diagnosis present

## 2023-02-02 DIAGNOSIS — Z90710 Acquired absence of both cervix and uterus: Secondary | ICD-10-CM

## 2023-02-02 DIAGNOSIS — Z90721 Acquired absence of ovaries, unilateral: Secondary | ICD-10-CM

## 2023-02-02 DIAGNOSIS — N1831 Chronic kidney disease, stage 3a: Secondary | ICD-10-CM | POA: Diagnosis not present

## 2023-02-02 DIAGNOSIS — Z885 Allergy status to narcotic agent status: Secondary | ICD-10-CM

## 2023-02-02 DIAGNOSIS — N185 Chronic kidney disease, stage 5: Secondary | ICD-10-CM | POA: Diagnosis not present

## 2023-02-02 DIAGNOSIS — N189 Chronic kidney disease, unspecified: Secondary | ICD-10-CM | POA: Diagnosis not present

## 2023-02-02 DIAGNOSIS — D509 Iron deficiency anemia, unspecified: Secondary | ICD-10-CM | POA: Diagnosis not present

## 2023-02-02 DIAGNOSIS — I959 Hypotension, unspecified: Secondary | ICD-10-CM | POA: Diagnosis present

## 2023-02-02 LAB — CBC WITH DIFFERENTIAL/PLATELET
Abs Immature Granulocytes: 0.01 10*3/uL (ref 0.00–0.07)
Basophils Absolute: 0 10*3/uL (ref 0.0–0.2)
Basophils Absolute: 0 10*3/uL (ref 0.0–0.1)
Basophils Relative: 0 %
Basos: 1 %
EOS (ABSOLUTE): 0.3 10*3/uL (ref 0.0–0.4)
Eos: 7 %
Eosinophils Absolute: 0.4 10*3/uL (ref 0.0–0.5)
Eosinophils Relative: 9 %
HCT: 26.8 % — ABNORMAL LOW (ref 36.0–46.0)
Hematocrit: 26.2 % — ABNORMAL LOW (ref 34.0–46.6)
Hemoglobin: 8.7 g/dL — ABNORMAL LOW (ref 11.1–15.9)
Hemoglobin: 8.7 g/dL — ABNORMAL LOW (ref 12.0–15.0)
Immature Grans (Abs): 0 10*3/uL (ref 0.0–0.1)
Immature Granulocytes: 0 %
Immature Granulocytes: 0 %
Lymphocytes Absolute: 0.7 10*3/uL (ref 0.7–3.1)
Lymphocytes Relative: 15 %
Lymphs Abs: 0.7 10*3/uL (ref 0.7–4.0)
Lymphs: 15 %
MCH: 33.6 pg — ABNORMAL HIGH (ref 26.6–33.0)
MCH: 33 pg (ref 26.0–34.0)
MCHC: 33.2 g/dL (ref 31.5–35.7)
MCHC: 32.5 g/dL (ref 30.0–36.0)
MCV: 101 fL — ABNORMAL HIGH (ref 79–97)
MCV: 101.5 fL — ABNORMAL HIGH (ref 80.0–100.0)
Monocytes Absolute: 0.5 10*3/uL (ref 0.1–0.9)
Monocytes Absolute: 0.4 10*3/uL (ref 0.1–1.0)
Monocytes Relative: 8 %
Monocytes: 10 %
Neutro Abs: 3.1 10*3/uL (ref 1.7–7.7)
Neutrophils Absolute: 3.3 10*3/uL (ref 1.4–7.0)
Neutrophils Relative %: 68 %
Neutrophils: 67 %
Platelets: 43 10*3/uL — CL (ref 150–450)
RBC: 2.59 x10E6/uL — CL (ref 3.77–5.28)
RBC: 2.64 MIL/uL — ABNORMAL LOW (ref 3.87–5.11)
RDW: 16.9 % — ABNORMAL HIGH (ref 11.7–15.4)
RDW: 16.2 % — ABNORMAL HIGH (ref 11.5–15.5)
WBC: 4.9 10*3/uL (ref 3.4–10.8)
WBC: 4.6 10*3/uL (ref 4.0–10.5)
nRBC: 0 % (ref 0.0–0.2)

## 2023-02-02 LAB — GLUCOSE, CAPILLARY: Glucose-Capillary: 110 mg/dL — ABNORMAL HIGH (ref 70–99)

## 2023-02-02 LAB — COMPREHENSIVE METABOLIC PANEL
ALT: 14 [IU]/L (ref 0–32)
ALT: 18 U/L (ref 0–44)
AST: 28 [IU]/L (ref 0–40)
AST: 29 U/L (ref 15–41)
Albumin: 3.3 g/dL — ABNORMAL LOW (ref 3.8–4.8)
Albumin: 2.9 g/dL — ABNORMAL LOW (ref 3.5–5.0)
Alkaline Phosphatase: 87 [IU]/L (ref 44–121)
Alkaline Phosphatase: 67 U/L (ref 38–126)
Anion gap: 13 (ref 5–15)
BUN/Creatinine Ratio: 12 (ref 12–28)
BUN: 86 mg/dL (ref 8–27)
BUN: 98 mg/dL — ABNORMAL HIGH (ref 8–23)
Bilirubin Total: 0.9 mg/dL (ref 0.0–1.2)
CO2: 17 mmol/L — ABNORMAL LOW (ref 20–29)
CO2: 20 mmol/L — ABNORMAL LOW (ref 22–32)
Calcium: 7.6 mg/dL — ABNORMAL LOW (ref 8.7–10.3)
Calcium: 7.5 mg/dL — ABNORMAL LOW (ref 8.9–10.3)
Chloride: 100 mmol/L (ref 96–106)
Chloride: 104 mmol/L (ref 98–111)
Creatinine, Ser: 7.26 mg/dL — ABNORMAL HIGH (ref 0.57–1.00)
Creatinine, Ser: 8.55 mg/dL — ABNORMAL HIGH (ref 0.44–1.00)
GFR, Estimated: 5 mL/min — ABNORMAL LOW (ref >60)
Globulin, Total: 2 g/dL (ref 1.5–4.5)
Glucose, Bld: 95 mg/dL (ref 70–99)
Glucose: 203 mg/dL — ABNORMAL HIGH (ref 70–99)
Potassium: 4.4 mmol/L (ref 3.5–5.2)
Potassium: 4 mmol/L (ref 3.5–5.1)
Sodium: 140 mmol/L (ref 134–144)
Sodium: 137 mmol/L (ref 135–145)
Total Bilirubin: 1.1 mg/dL (ref <1.2)
Total Protein: 5.3 g/dL — ABNORMAL LOW (ref 6.0–8.5)
Total Protein: 5.2 g/dL — ABNORMAL LOW (ref 6.5–8.1)
eGFR: 6 mL/min/{1.73_m2} — ABNORMAL LOW (ref >59)

## 2023-02-02 LAB — URINALYSIS, ROUTINE W REFLEX MICROSCOPIC
Bilirubin Urine: NEGATIVE
Glucose, UA: NEGATIVE mg/dL
Hgb urine dipstick: NEGATIVE
Ketones, ur: NEGATIVE mg/dL
Nitrite: NEGATIVE
Protein, ur: NEGATIVE mg/dL
Specific Gravity, Urine: 1.014 (ref 1.005–1.030)
WBC, UA: >50 WBC/hpf (ref 0–5)
pH: 5 (ref 5.0–8.0)

## 2023-02-02 LAB — PROTIME-INR
INR: 1.2 (ref 0.9–1.2)
Prothrombin Time: 13.4 s — ABNORMAL HIGH (ref 9.1–12.0)

## 2023-02-02 LAB — LACTIC ACID, PLASMA
Lactic Acid, Venous: 2.1 mmol/L (ref 0.5–1.9)
Lactic Acid, Venous: 1.8 mmol/L (ref 0.5–1.9)

## 2023-02-02 LAB — HEPATITIS B SURFACE ANTIBODY,QUALITATIVE: Hep B Surface Ab, Qual: NONREACTIVE

## 2023-02-02 LAB — HEPATITIS A ANTIBODY, TOTAL: hep A Total Ab: POSITIVE — AB

## 2023-02-02 LAB — AFP TUMOR MARKER: AFP, Serum, Tumor Marker: <1.8 ng/mL (ref 0.0–9.2)

## 2023-02-02 MED ORDER — INSULIN DETEMIR 100 UNIT/ML ~~LOC~~ SOLN
8.0000 [IU] | Freq: Every day | SUBCUTANEOUS | Status: DC
  Administered 2023-02-03 – 2023-02-04 (×2): 8 [IU] via SUBCUTANEOUS
  Filled 2023-02-02 (×4): qty 0.08

## 2023-02-02 MED ORDER — ONDANSETRON HCL 4 MG/2ML IJ SOLN
4.0000 mg | Freq: Four times a day (QID) | INTRAMUSCULAR | Status: DC | PRN
  Administered 2023-02-14: 4 mg via INTRAVENOUS
  Filled 2023-02-02 (×2): qty 2

## 2023-02-02 MED ORDER — ACETAMINOPHEN 325 MG PO TABS
650.0000 mg | ORAL_TABLET | Freq: Four times a day (QID) | ORAL | Status: DC | PRN
  Administered 2023-02-04 – 2023-02-10 (×3): 650 mg via ORAL
  Filled 2023-02-02 (×3): qty 2

## 2023-02-02 MED ORDER — SODIUM CHLORIDE 0.9 % IV SOLN: INTRAVENOUS | Status: DC

## 2023-02-02 MED ORDER — INSULIN ASPART 100 UNIT/ML IJ SOLN: 0.0000 [IU] | Freq: Every day | INTRAMUSCULAR | Status: DC

## 2023-02-02 MED ORDER — LACTATED RINGERS IV BOLUS
2000.0000 mL | Freq: Once | INTRAVENOUS | Status: AC
  Administered 2023-02-02: 2000 mL via INTRAVENOUS

## 2023-02-02 MED ORDER — INSULIN DETEMIR 100 UNIT/ML ~~LOC~~ SOLN
SUBCUTANEOUS | Status: AC
  Filled 2023-02-02: qty 1

## 2023-02-02 MED ORDER — NYSTATIN 100000 UNIT/GM EX POWD
1.0000 | Freq: Three times a day (TID) | CUTANEOUS | Status: DC
  Administered 2023-02-02 – 2023-02-14 (×34): 1 via TOPICAL
  Filled 2023-02-02 (×2): qty 15

## 2023-02-02 MED ORDER — SODIUM CHLORIDE 0.9 % IV SOLN
1.0000 g | Freq: Once | INTRAVENOUS | Status: AC
  Administered 2023-02-02: 1 g via INTRAVENOUS
  Filled 2023-02-02: qty 10

## 2023-02-02 MED ORDER — LACTATED RINGERS IV BOLUS: 20.0000 mL/kg | Freq: Once | INTRAVENOUS | Status: DC

## 2023-02-02 MED ORDER — SODIUM CHLORIDE 0.9 % IV SOLN
1.0000 g | INTRAVENOUS | Status: AC
  Administered 2023-02-03 – 2023-02-04 (×2): 1 g via INTRAVENOUS
  Filled 2023-02-02 (×2): qty 10

## 2023-02-02 MED ORDER — LACTATED RINGERS IV BOLUS
1000.0000 mL | Freq: Once | INTRAVENOUS | Status: AC
  Administered 2023-02-02: 1000 mL via INTRAVENOUS

## 2023-02-02 MED ORDER — LACTULOSE 10 GM/15ML PO SOLN: 10.0000 g | Freq: Every day | ORAL | Status: DC | PRN

## 2023-02-02 MED ORDER — LEVOTHYROXINE SODIUM 25 MCG PO TABS
125.0000 ug | ORAL_TABLET | Freq: Every day | ORAL | Status: DC
Start: 2023-02-03 — End: 2023-02-11
  Administered 2023-02-03 – 2023-02-11 (×9): 125 ug via ORAL
  Filled 2023-02-02 (×9): qty 1

## 2023-02-02 MED ORDER — OXYCODONE HCL 5 MG PO TABS
5.0000 mg | ORAL_TABLET | ORAL | Status: DC | PRN
  Administered 2023-02-02 – 2023-02-05 (×2): 5 mg via ORAL
  Filled 2023-02-02 (×2): qty 1

## 2023-02-02 MED ORDER — ONDANSETRON HCL 4 MG PO TABS
4.0000 mg | ORAL_TABLET | Freq: Four times a day (QID) | ORAL | Status: DC | PRN
  Administered 2023-02-06: 4 mg via ORAL
  Filled 2023-02-02: qty 1

## 2023-02-02 MED ORDER — LACTATED RINGERS IV BOLUS: 2000.0000 mL/kg | Freq: Once | INTRAVENOUS | Status: DC

## 2023-02-02 MED ORDER — LACTATED RINGERS IV BOLUS: 1000.0000 mL | Freq: Once | INTRAVENOUS | Status: DC

## 2023-02-02 MED ORDER — INSULIN ASPART 100 UNIT/ML IJ SOLN
0.0000 [IU] | Freq: Three times a day (TID) | INTRAMUSCULAR | Status: DC
  Administered 2023-02-04: 2 [IU] via SUBCUTANEOUS
  Administered 2023-02-04: 3 [IU] via SUBCUTANEOUS
  Administered 2023-02-05 – 2023-02-07 (×3): 2 [IU] via SUBCUTANEOUS
  Administered 2023-02-08 (×2): 3 [IU] via SUBCUTANEOUS
  Administered 2023-02-09 – 2023-02-11 (×5): 2 [IU] via SUBCUTANEOUS

## 2023-02-02 MED ORDER — SODIUM BICARBONATE 650 MG PO TABS
1300.0000 mg | ORAL_TABLET | Freq: Two times a day (BID) | ORAL | Status: DC
Start: 2023-02-02 — End: 2023-02-09
  Administered 2023-02-02 – 2023-02-09 (×14): 1300 mg via ORAL
  Filled 2023-02-02 (×15): qty 2

## 2023-02-02 MED ORDER — ACETAMINOPHEN 650 MG RE SUPP: 650.0000 mg | Freq: Four times a day (QID) | RECTAL | Status: DC | PRN

## 2023-02-02 NOTE — Telephone Encounter (Signed)
I spoke with the patient's daughter Cala Bradford regarding the most recent results showing severe AKI.  I advised them to bring her to the ER as soon as possible to have further evaluation of her AKI.  Etiology of her elevation is unclear to me at the moment.  They will bring the patient to the to the ER.

## 2023-02-02 NOTE — Assessment & Plan Note (Signed)
-   History of thrombocytopenia - Last platelet count was 43 - Hold pharmaceutical DVT prophylaxis - Continue to monitor

## 2023-02-02 NOTE — H&P (Signed)
History and Physical    Patient: [name] UJW:[id number] DOB: 1951/01/12 DOA: 02/02/2023 DOS: the patient was seen and examined on 02/02/2023 PCP: Benita Stabile, MD  Patient coming from: Home  Chief Complaint:  Chief Complaint  Patient presents with   Abnormal Lab   HPI: [name] is a 72 y.o. female with medical history significant of anemia, CKD, is mellitus type II, depression, GERD, hyperlipidemia, hypertension, hypothyroidism, retinopathy, and more presents the ED with a chief complaint of abnormal labs.  Patient reports she woke up feeling like her normal self, and everybody was telling her that she needed to go to the ER.  Apparently patient had recently been admitted for hepatic encephalopathy.  She was discharged on October 22.  She had a follow-up appointment with GI where they drew routine labs.  They called her and told her that she needed to come into the ER due to her BUN and creatinine.  Patient reports she is in no pain at the time of my exam.  She reports she has been having diarrhea but that is normal for her.  I would expect her to have diarrhea because she is on lactulose.  She denies any dizziness, chest pain, palpitations, shortness of breath.  Patient reports she is not sure why she is here.  On chart review patient recently discharged after a 9-day stay in the hospital.  She was found to have hepatic encephalopathy at that time.  She had no history of hepatic encephalopathy.  Her ammonia was as high as 130.  She also appeared to be dehydrated.  Her symptoms improved with a lactulose enema.  She completed IV ceftriaxone x 5 days for spontaneous bacterial peritonitis.  Her ammonia was down to 47 at discharge.  She did have a paracentesis on October 14 with 5.5 L of cloudy ascitic fluid removed.  Patient was sent home on lactulose to be titrated to achieve 2-3 mushy stools per day per discharge summary.  Patient had an acute kidney injury at that admission as well.  Her  creatinine peaked at 2.77 at that time.  She was discharged on ciprofloxacin from that admission.  Patient has no other complaints at this time.  Patient does not smoke and does not drink.  Patient reports she would want to be full code.  Son at bedside reports that she had a DNR in the past, but revoked it.  Patient is full code.    Review of Systems: As mentioned in the history of present illness. All other systems reviewed and are negative. Past Medical History:  Diagnosis Date   Alkaline phosphatase elevation    Anemia    Anemia in stage 4 chronic kidney disease (HCC) 10/15/2022   ASD (atrial septal defect)    Cataract    both eyes hx of   Chest pain    03-17-2013 last chest pain   Chronic kidney disease    Stage III kidney disease   Cirrhosis (HCC)    Depression    Diabetes mellitus type II    Family history of adverse reaction to anesthesia    Son hard to wake up   Fatigue    GERD (gastroesophageal reflux disease)    Gout    Headache(784.0)    occasional   Hyperlipidemia    Hypertension    Hypothyroidism    Neuropathy    Osteopenia    Peripheral neuropathy    Presence of pessary    Retinopathy  Past Surgical History:  Procedure Laterality Date   APPENDECTOMY     CATARACT EXTRACTION     CERVICAL SPINE SURGERY  2006   CHOLECYSTECTOMY N/A 04/07/2013   Procedure: LAPAROSCOPIC CHOLECYSTECTOMY WITH INTRAOPERATIVE CHOLANGIOGRAM;  Surgeon: Robyne Askew, MD;  Location: [facility];  Service: General;  Laterality: N/A;   COMBINED HYSTERECTOMY VAGINAL / OOPHORECTOMY / A&P REPAIR  1987   Unilateral oophorectomy, h/o uterine prolapse has right ovary   ERCP N/A 04/06/2013   Procedure: ENDOSCOPIC RETROGRADE CHOLANGIOPANCREATOGRAPHY (ERCP);  Surgeon: Theda Belfast, MD;  Location: [facility];  Service: Endoscopy;  Laterality: N/A;   ESOPHAGEAL BANDING N/A 09/26/2021   Procedure: ESOPHAGEAL BANDING;  Surgeon: Jeani Hawking, MD;  Location: [facility];  Service:  Gastroenterology;  Laterality: N/A;   ESOPHAGEAL BANDING N/A 10/31/2021   Procedure: ESOPHAGEAL BANDING;  Surgeon: Jeani Hawking, MD;  Location: [facility];  Service: Gastroenterology;  Laterality: N/A;   ESOPHAGOGASTRODUODENOSCOPY (EGD) WITH PROPOFOL N/A 08/16/2020   Procedure: ESOPHAGOGASTRODUODENOSCOPY (EGD) WITH PROPOFOL;  Surgeon: Jeani Hawking, MD;  Location: [facility];  Service: Endoscopy;  Laterality: N/A;   ESOPHAGOGASTRODUODENOSCOPY (EGD) WITH PROPOFOL N/A 09/26/2021   Procedure: ESOPHAGOGASTRODUODENOSCOPY (EGD) WITH PROPOFOL;  Surgeon: Jeani Hawking, MD;  Location: [facility];  Service: Gastroenterology;  Laterality: N/A;   ESOPHAGOGASTRODUODENOSCOPY (EGD) WITH PROPOFOL N/A 10/31/2021   Procedure: ESOPHAGOGASTRODUODENOSCOPY (EGD) WITH PROPOFOL;  Surgeon: Jeani Hawking, MD;  Location: [facility];  Service: Gastroenterology;  Laterality: N/A;   EUS N/A 03/31/2013   Procedure: UPPER ENDOSCOPIC ULTRASOUND (EUS) LINEAR;  Surgeon: Theda Belfast, MD;  Location: [facility];  Service: Endoscopy;  Laterality: N/A;   REFRACTIVE SURGERY     SPINAL FUSION     c4-c7   TONSILLECTOMY  age 31   Social History:  reports that she has never smoked. She has never used smokeless tobacco. She reports that she does not drink alcohol and does not use drugs.  Allergies  Allergen Reactions   Bactrim [Sulfamethoxazole-Trimethoprim] Nausea And Vomiting and Other (See Comments)    Dizziness   Cipro [Ciprofloxacin Hcl] Other (See Comments)    Severe yeast infection   Codeine Other (See Comments)    Unknown reaction   Cozaar [Losartan Potassium] Cough   Lipitor [Atorvastatin] Other (See Comments)    Arthralgias Myalgias   Mevacor [Lovastatin] Other (See Comments)    Arthralgias  Myalgias   Pravachol [Pravastatin] Other (See Comments)    Arthralgias  Myalgias    Rosuvastatin Diarrhea and Other (See Comments)    Arthralgias Myalgias   Statins Other (See Comments)    Arthralgias Myalgias    Welchol [Colesevelam] Other (See Comments)    Arthralgias Myalgias    Zestril [Lisinopril] Cough   Zetia [Ezetimibe] Other (See Comments)    Arthralgias Myalgias    Dilaudid [Hydromorphone] Nausea And Vomiting and Other (See Comments)    GI Intolerance     Family History  Problem Relation Age of Onset   COPD Mother    Lung cancer Father    Diabetes Sister    Cataracts Sister    Insulin resistance Daughter    Insulin resistance Son     Prior to Admission medications   Medication Sig Start Date End Date Taking? Authorizing Provider  acetaminophen (TYLENOL) 500 MG tablet Take 500 mg by mouth every 6 (six) hours as needed for mild pain (pain score 1-3).   Yes [provider]  ciprofloxacin (CIPRO) 500 MG tablet Take 1 tablet (500 mg total) by mouth daily with breakfast. 01/20/23  Yes Emokpae, Courage, MD  insulin NPH Human (NOVOLIN N RELION) 100 UNIT/ML injection Inject 16 Units into the skin in the morning and at bedtime.   Yes [provider]  insulin regular (NOVOLIN R RELION) 100 units/mL injection Inject 5 Units into the skin 3 (three) times daily before meals.   Yes [provider]  lactose free nutrition (BOOST) LIQD Take 237 mLs by mouth daily.   Yes [provider]  lactulose (CHRONULAC) 10 GM/15ML solution Take 15 mLs (10 g total) by mouth 3 (three) times daily. Titrate to have 2-3 mushy stools per day Patient taking differently: Take 10 g by mouth daily as needed for mild constipation. 01/19/23  Yes Shon Hale, MD  levothyroxine (SYNTHROID) 125 MCG tablet Take 125 mcg by mouth daily before breakfast. 09/03/22  Yes [provider]  nystatin (MYCOSTATIN/NYSTOP) powder Apply 1 Application topically 3 (three) times daily. Patient taking differently: Apply 1 Application topically daily. 01/19/23  Yes Emokpae, Courage, MD  propranolol (INDERAL) 20 MG tablet Take 1 tablet (20 mg total) by mouth daily. 01/28/23  Yes Dolores Frame, MD  sodium bicarbonate 650 MG tablet Take 2 tablets (1,300 mg total) by mouth 2 (two) times daily. 09/10/22 09/10/23 Yes Emokpae, Courage, MD  torsemide (DEMADEX) 20 MG tablet Take 1 tablet (20 mg total) by mouth daily. 01/19/23  Yes Emokpae, Courage, MD  Vitamin D, Ergocalciferol, (DRISDOL) 1.25 MG (50000 UT) CAPS capsule Take 50,000 Units by mouth every Wednesday. 11/16/18  Yes [provider]    Physical Exam: Vitals:   02/02/23 1656 02/02/23 1700 02/02/23 1815 02/02/23 1930  BP:  (!) 103/51 (!) 109/50 (!) 95/45  Pulse:  64 66 64  Resp:  12 13 13   Temp: 97.6 F (36.4 C)     TempSrc: Oral     SpO2:  100% 98% 95%   1.  General: Patient lying supine in bed,  no acute distress   2. Psychiatric: Alert and oriented x 3, mood and behavior normal for situation, pleasant and cooperative with exam   3. Neurologic: Speech and language are normal, face is symmetric, moves all 4 extremities voluntarily, at baseline without acute deficits on limited exam   4. HEENMT:  Head is atraumatic, normocephalic, pupils reactive to light, neck is supple, trachea is midline, mucous membranes are moist   5. Respiratory : Lungs are clear to auscultation bilaterally without wheezing, rhonchi, rales, no cyanosis, no increase in work of breathing or accessory muscle use   6. Cardiovascular : S1, S2 normal, regular rhythm and normal rate   7. Gastrointestinal:  Abdomen is soft, slightly distended, nontender to palpation bowel sounds active, no masses or organomegaly palpated   8. Skin:  Skin is warm, dry and intact without rashes, acute lesions, or ulcers on limited exam   9.Musculoskeletal:  No acute deformities or trauma, no asymmetry in tone, trace peripheral edema, peripheral pulses palpated, no tenderness to palpation in the extremities  Data Reviewed: In the ED. Patient is borderline leukopenic at 4.6, she was hypotensive when she initially got there at 85/36, after  fluids her blood pressure was up to 138/64 Patient has an AKI with a BUN of 98 and a creatinine of 8.55 UA is indicative of UTI CT abdomen pelvis shows no calculi or obstructive uropathy Admission was requested due to significant AKI  Assessment and Plan: * AKI (acute kidney injury) (HCC) - Creatinine at baseline is around 2 with a GFR 27 - Creatinine today 8.55  with a GFR of 5 - Lactic acidosis at 2.1 - Continue home bicarb - Likely related to hypovolemia with recent diarrhea and currently on diuretic - Avoid nephrotoxic agents when possible - Trend in the a.m.  Liver cirrhosis secondary to NASH (HCC) - Reducing lactulose to as needed for mild constipation given patient's hypovolemic status and AKI - Check ammonia in the a.m. - CT did redemonstrate cirrhosis and portal venous hypertension as well as ascites - Continue to monitor  Type 2 diabetes mellitus with diabetic chronic kidney disease (HCC) - Sliding scale coverage - Monitor CBG  Hypotension - Blood pressure as low as 85/36 at presentation - Family reports that blood pressure does run low at home - After 3 L of fluid her systolic is still in the low 100s - Monitor on stepdown - Continue IV fluid - Hold Inderal and torsemide - Continue to monitor  UTI (urinary tract infection) - UA indicative of UTI - Urine culture pending - Appears that she was recently on Cipro outpatient - Rocephin started in the ED - Continue Rocephin - CT abdomen pelvis did not show any obstructive uropathy or calculi present - Continue to monitor  Anemia due to chronic kidney disease - Hemoglobin stable at 8.7 - Likely related to chronic disease - Trend in the a.m. - No active bleeding reported  Thrombocytopenia (HCC) - History of thrombocytopenia - Last platelet count was 43 - Hold pharmaceutical DVT prophylaxis - Continue to monitor  Acquired hypothyroidism - Continue Synthroid      Advance Care Planning:   Code Status: Full  Code  Consults: Nephrology  Family Communication: Son at bedside  Severity of Illness: The appropriate patient status for this patient is INPATIENT. Inpatient status is judged to be reasonable and necessary in order to provide the required intensity of service to ensure the patient's safety. The patient's presenting symptoms, physical exam findings, and initial radiographic and laboratory data in the context of their chronic comorbidities is felt to place them at high risk for further clinical deterioration. Furthermore, it is not anticipated that the patient will be medically stable for discharge from the hospital within 2 midnights of admission.   * I certify that at the point of admission it is my clinical judgment that the patient will require inpatient hospital care spanning beyond 2 midnights from the point of admission due to high intensity of service, high risk for further deterioration and high frequency of surveillance required.*  Author: Lilyan Gilford, DO 02/02/2023 7:53 PM  For on call review [email or web address].

## 2023-02-02 NOTE — ED Notes (Signed)
Pt arrived to room by wheelchair. No apparent distress. Paracentesis to left side abdomen yesterday. No drainage seen or odor smelled. Pale in appearance. Denies any pain or SHOB. Complains of dizziness with movement. Pt able to stand and pivot to bed from wheelchair.

## 2023-02-02 NOTE — Assessment & Plan Note (Signed)
-   Hemoglobin stable at 8.7 - Likely related to chronic disease - Trend in the a.m. - No active bleeding reported

## 2023-02-02 NOTE — Assessment & Plan Note (Signed)
-   UA indicative of UTI - Urine culture pending - Appears that she was recently on Cipro outpatient - Rocephin started in the ED - Continue Rocephin - CT abdomen pelvis did not show any obstructive uropathy or calculi present - Continue to monitor

## 2023-02-02 NOTE — Assessment & Plan Note (Signed)
-   Creatinine at baseline is around 2 with a GFR 27 - Creatinine today 8.55 with a GFR of 5 - Lactic acidosis at 2.1 - Continue home bicarb - Likely related to hypovolemia with recent diarrhea and currently on diuretic - Avoid nephrotoxic agents when possible - Trend in the a.m.

## 2023-02-02 NOTE — ED Provider Notes (Signed)
[facility] EM[facility] AT [facility] Provider Note   CSN: [id number] Arrival date & time: 02/02/23  1139     History  Chief Complaint  Patient presents with   Abnormal Lab    [name] is a 72 y.o. female.  She has past medical history of diabetes, GERD, stage III CKD, thrombocytopenia, QT prolongation, anemia, ascites, cirrhosis.  Presents the ER today for abnormal labs, GI drew labs yesterday and had called her with significant AKI and told to come to the ER.  She has been feeling somewhat dizzy, reports decreased urinary output over the past several days, reports she has been eating and drinking okay.  Denies fever chills abdominal pain or dysuria.   Abnormal Lab      Home Medications Prior to Admission medications   Medication Sig Start Date End Date Taking? Authorizing Provider  acetaminophen (TYLENOL) 500 MG tablet Take 500 mg by mouth every 6 (six) hours as needed for mild pain (pain score 1-3).   Yes [provider]  ciprofloxacin (CIPRO) 500 MG tablet Take 1 tablet (500 mg total) by mouth daily with breakfast. 01/20/23  Yes Emokpae, Courage, MD  insulin NPH Human (NOVOLIN N RELION) 100 UNIT/ML injection Inject 16 Units into the skin in the morning and at bedtime.   Yes [provider]  insulin regular (NOVOLIN R RELION) 100 units/mL injection Inject 5 Units into the skin 3 (three) times daily before meals.   Yes [provider]  lactose free nutrition (BOOST) LIQD Take 237 mLs by mouth daily.   Yes [provider]  lactulose (CHRONULAC) 10 GM/15ML solution Take 15 mLs (10 g total) by mouth 3 (three) times daily. Titrate to have 2-3 mushy stools per day Patient taking differently: Take 10 g by mouth daily as needed for mild constipation. 01/19/23  Yes Shon Hale, MD  levothyroxine (SYNTHROID) 125 MCG tablet Take 125 mcg by mouth daily before breakfast. 09/03/22  Yes [provider]  nystatin  (MYCOSTATIN/NYSTOP) powder Apply 1 Application topically 3 (three) times daily. Patient taking differently: Apply 1 Application topically daily. 01/19/23  Yes Emokpae, Courage, MD  propranolol (INDERAL) 20 MG tablet Take 1 tablet (20 mg total) by mouth daily. 01/28/23  Yes Dolores Frame, MD  sodium bicarbonate 650 MG tablet Take 2 tablets (1,300 mg total) by mouth 2 (two) times daily. 09/10/22 09/10/23 Yes Emokpae, Courage, MD  torsemide (DEMADEX) 20 MG tablet Take 1 tablet (20 mg total) by mouth daily. 01/19/23  Yes Emokpae, Courage, MD  Vitamin D, Ergocalciferol, (DRISDOL) 1.25 MG (50000 UT) CAPS capsule Take 50,000 Units by mouth every Wednesday. 11/16/18  Yes [provider]      Allergies    Bactrim [sulfamethoxazole-trimethoprim], Cipro [ciprofloxacin hcl], Codeine, Cozaar [losartan potassium], Lipitor [atorvastatin], Mevacor [lovastatin], Pravachol [pravastatin], Rosuvastatin, Statins, Welchol [colesevelam], Zestril [lisinopril], Zetia [ezetimibe], and Dilaudid [hydromorphone]    Review of Systems   Review of Systems  Physical Exam Updated Vital Signs BP (!) 103/51   Pulse 64   Temp 97.6 F (36.4 C) (Oral)   Resp 12   SpO2 100%  Physical Exam Vitals and nursing note reviewed.  Constitutional:      General: She is not in acute distress.    Appearance: She is well-developed.  HENT:     Head: Normocephalic and atraumatic.     Mouth/Throat:     Mouth: Mucous membranes are moist.  Eyes:     Extraocular Movements: Extraocular movements intact.  Conjunctiva/sclera: Conjunctivae normal.     Pupils: Pupils are equal, round, and reactive to light.  Cardiovascular:     Rate and Rhythm: Normal rate and regular rhythm.     Heart sounds: No murmur heard. Pulmonary:     Effort: Pulmonary effort is normal. No respiratory distress.     Breath sounds: Normal breath sounds.  Abdominal:     Palpations: Abdomen is soft.     Tenderness: There is no abdominal  tenderness.  Musculoskeletal:        General: No swelling. Normal range of motion.     Cervical back: Normal range of motion and neck supple.  Skin:    General: Skin is warm and dry.     Capillary Refill: Capillary refill takes less than 2 seconds.  Neurological:     General: No focal deficit present.     Mental Status: She is alert and oriented to person, place, and time.  Psychiatric:        Mood and Affect: Mood normal.     ED Results / Procedures / Treatments   Labs (all labs ordered are listed, but only abnormal results are displayed) Labs Reviewed  CBC WITH DIFFERENTIAL/PLATELET - Abnormal; Notable for the following components:      Result Value   RBC 2.64 (*)    Hemoglobin 8.7 (*)    HCT 26.8 (*)    MCV 101.5 (*)    RDW 16.2 (*)    All other components within normal limits  COMPREHENSIVE METABOLIC PANEL - Abnormal; Notable for the following components:   CO2 20 (*)    BUN 98 (*)    Creatinine, Ser 8.55 (*)    Calcium 7.5 (*)    Total Protein 5.2 (*)    Albumin 2.9 (*)    GFR, Estimated 5 (*)    All other components within normal limits  URINALYSIS, ROUTINE W REFLEX MICROSCOPIC - Abnormal; Notable for the following components:   APPearance CLOUDY (*)    Leukocytes,Ua MODERATE (*)    Bacteria, UA RARE (*)    Non Squamous Epithelial 0-5 (*)    All other components within normal limits  URINE CULTURE  LACTIC ACID, PLASMA  LACTIC ACID, PLASMA    EKG None    Procedures Procedures    Medications Ordered in ED Medications  lactated ringers bolus 2,000 mL (0 mLs Intravenous Stopped 02/02/23 1655)  cefTRIAXone (ROCEPHIN) 1 g in sodium chloride 0.9 % 100 mL IVPB (1 g Intravenous New Bag/Given 02/02/23 1655)  lactated ringers bolus 1,000 mL (1,000 mLs Intravenous New Bag/Given 02/02/23 1737)    ED Course/ Medical Decision Making/ A&P                                 Medical Decision Making This patient presents to the ED for concern of labs, severe AKI, this  involves an extensive number of treatment options, and is a complaint that carries with it a high risk of complications and morbidity.  The differential diagnosis includes hydration, electrolyte abnormality, renal failure, struct of uropathy, other Co morbidities that complicate the patient evaluation :   CKD, DM, cirrhosis      Additional history obtained:  Additional history obtained from EMR External records from outside source obtained and reviewed including notes, labs   Lab Tests:  I Ordered, and personally interpreted labs.  The pertinent results include: Open stable at 8.7, BUN elevated 98, creatinine  elevated to 8.55   Imaging Studies ordered:  I ordered imaging studies including CT abdomen pelvis which shows ascites, no obstructive uropathy I independently visualized and interpreted imaging within scope of identifying emergent findings  I agree with the radiologist interpretation   Cardiac Monitoring: / EKG:  The patient was maintained on a cardiac monitor.  I personally viewed and interpreted the cardiac monitored which showed an underlying rhythm of: Sinus rhythm   Consultations Obtained:  I requested consultation with the hospitalist,  and discussed lab and imaging findings as well as pertinent plan - they recommend: Admission   Problem List / ED Course / Critical interventions / Medication management   Severe AKI-patient has stage IV CKD, was having routine follow-up with GI for her cirrhosis and ascites and had labs drawn yesterday, noted, creatinine.  Sent to the ER today.  Family states she drinks very little water because she is worried about ascites worsening, she has been eating well with a states she has gotten dehydrated before due to intentionally not drinking enough water to try to prevent buildup of ascitic fluid.  Patient has been feeling her usual self, no fever, no nausea vomiting no abdominal pain.  Reports some mild low back pain over the past week.   Initially hypotensive, given IV fluids, has maintain MAP greater than 65 patient's family states that blood pressure of about 100/60 is her usual.  Patient had complained of some dizziness but she states this is her baseline and is unchanged.  No syncope, she is not febrile, no leukocytosis, no tachycardia or tachypnea to suggest sepsis.  Discussed with hospitalist as above for further evaluation and admission.  I have reviewed the patients home medicines and have made adjustments as needed      Amount and/or Complexity of Data Reviewed Labs: ordered. Radiology: ordered.  Risk Decision regarding hospitalization.           Final Clinical Impression(s) / ED Diagnoses Final diagnoses:  AKI (acute kidney injury) [facility])    Rx / DC Orders ED Discharge Orders     None         Josem Kaufmann 02/02/23 1749    Glendora Score, MD 02/02/23 1933

## 2023-02-02 NOTE — Assessment & Plan Note (Signed)
-   Reducing lactulose to as needed for mild constipation given patient's hypovolemic status and AKI - Check ammonia in the a.m. - CT did redemonstrate cirrhosis and portal venous hypertension as well as ascites - Continue to monitor

## 2023-02-02 NOTE — Assessment & Plan Note (Signed)
Continue Synthroid °

## 2023-02-02 NOTE — ED Notes (Addendum)
Report attempted to be given, nurse told by ICU nurse that pt can come up after 7pm due to staffing.

## 2023-02-02 NOTE — ED Triage Notes (Signed)
Pt instructed to go to ED for kidney function abnormal. Drawn yesterday. + dizziness.

## 2023-02-02 NOTE — ED Provider Triage Note (Signed)
Emergency Medicine Provider Triage Evaluation Note  BUELAH RENNIE , a 72 y.o. female  was evaluated in triage.  Pt complains of normal labs-had labs drawn by GI yesterday that showed severe AKI and advised to come to the ER.  Patient states she has had decreased urinary output and feels slightly dizzy.  No syncope, also complaining of some mid back pain bilaterally..  Review of Systems  Positive: Back pain, decreased urine output, dizziness Negative: Abdominal pain, chest pain, dysuria, vomiting, syncope  Physical Exam  BP (!) 96/36   Pulse 64   Temp 97.8 F (36.6 C) (Oral)   Resp 16   SpO2 98%  Gen:   Awake, no distress   Resp:  Normal effort  MSK:   Moves extremities without difficulty  Other:    Medical Decision Making  Medically screening exam initiated at 12:29 PM.  Appropriate orders placed.  KESHARA KIGER was informed that the remainder of the evaluation will be completed by another provider, this initial triage assessment does not replace that evaluation, and the importance of remaining in the ED until their evaluation is complete.     Ma Rings, [place] 02/02/23 1231

## 2023-02-02 NOTE — Progress Notes (Signed)
  Care Coordination Note  02/02/2023 Name: ABAGAEL KRAMM MRN: [id number] DOB: 08/13/1950  [name] is a 72 y.o. year old female who is a primary care patient of Margo Aye, Kathleene Hazel, MD and is actively engaged with the care management team. I reached out to Val Eagle by phone today to assist with scheduling a follow up visit with the RN Case Manager  Follow up plan: Unsuccessful telephone outreach attempt made. A HIPAA compliant phone message was left for the patient providing contact information and requesting a return call.   [facility]  Care Coordination Care Guide  Direct Dial: [phone]

## 2023-02-02 NOTE — Assessment & Plan Note (Signed)
-   Sliding scale coverage - Monitor CBG

## 2023-02-02 NOTE — ED Notes (Signed)
Pt lungs were clear. Has received over half of fluids.

## 2023-02-02 NOTE — Assessment & Plan Note (Signed)
-   Blood pressure as low as 85/36 at presentation - Family reports that blood pressure does run low at home - After 3 L of fluid her systolic is still in the low 100s - Monitor on stepdown - Continue IV fluid - Hold Inderal and torsemide - Continue to monitor

## 2023-02-02 NOTE — ED Notes (Signed)
Pt back from CT. No apparent distress at this time.

## 2023-02-02 NOTE — ED Notes (Addendum)
1L of fluid being administered currently. Pt has hx of CHF. Lungs sounds were clear prior to administration.

## 2023-02-03 ENCOUNTER — Inpatient Hospital Stay (HOSPITAL_COMMUNITY): Payer: Medicare Other

## 2023-02-03 ENCOUNTER — Inpatient Hospital Stay: Payer: Medicare Other

## 2023-02-03 ENCOUNTER — Other Ambulatory Visit: Payer: Self-pay

## 2023-02-03 DIAGNOSIS — N179 Acute kidney failure, unspecified: Secondary | ICD-10-CM | POA: Diagnosis not present

## 2023-02-03 DIAGNOSIS — K7460 Unspecified cirrhosis of liver: Secondary | ICD-10-CM

## 2023-02-03 DIAGNOSIS — K7581 Nonalcoholic steatohepatitis (NASH): Secondary | ICD-10-CM

## 2023-02-03 DIAGNOSIS — E1122 Type 2 diabetes mellitus with diabetic chronic kidney disease: Secondary | ICD-10-CM

## 2023-02-03 DIAGNOSIS — N390 Urinary tract infection, site not specified: Secondary | ICD-10-CM

## 2023-02-03 DIAGNOSIS — Z794 Long term (current) use of insulin: Secondary | ICD-10-CM

## 2023-02-03 DIAGNOSIS — N1831 Chronic kidney disease, stage 3a: Secondary | ICD-10-CM

## 2023-02-03 LAB — CBC WITH DIFFERENTIAL/PLATELET
Abs Immature Granulocytes: 0.02 10*3/uL (ref 0.00–0.07)
Basophils Absolute: 0 10*3/uL (ref 0.0–0.1)
Basophils Relative: 0 %
Eosinophils Absolute: 0.3 10*3/uL (ref 0.0–0.5)
Eosinophils Relative: 8 %
HCT: 26.2 % — ABNORMAL LOW (ref 36.0–46.0)
Hemoglobin: 8.3 g/dL — ABNORMAL LOW (ref 12.0–15.0)
Immature Granulocytes: 1 %
Lymphocytes Relative: 15 %
Lymphs Abs: 0.6 10*3/uL — ABNORMAL LOW (ref 0.7–4.0)
MCH: 32.3 pg (ref 26.0–34.0)
MCHC: 31.7 g/dL (ref 30.0–36.0)
MCV: 101.9 fL — ABNORMAL HIGH (ref 80.0–100.0)
Monocytes Absolute: 0.3 10*3/uL (ref 0.1–1.0)
Monocytes Relative: 7 %
Neutro Abs: 2.9 10*3/uL (ref 1.7–7.7)
Neutrophils Relative %: 69 %
Platelets: 26 10*3/uL — CL (ref 150–400)
RBC: 2.57 MIL/uL — ABNORMAL LOW (ref 3.87–5.11)
RDW: 16.1 % — ABNORMAL HIGH (ref 11.5–15.5)
WBC: 4.1 10*3/uL (ref 4.0–10.5)
nRBC: 0 % (ref 0.0–0.2)

## 2023-02-03 LAB — URINE CULTURE: Culture: NO GROWTH

## 2023-02-03 LAB — COMPREHENSIVE METABOLIC PANEL
ALT: 18 U/L (ref 0–44)
AST: 34 U/L (ref 15–41)
Albumin: 2.6 g/dL — ABNORMAL LOW (ref 3.5–5.0)
Alkaline Phosphatase: 64 U/L (ref 38–126)
Anion gap: 15 (ref 5–15)
BUN: 89 mg/dL — ABNORMAL HIGH (ref 8–23)
CO2: 18 mmol/L — ABNORMAL LOW (ref 22–32)
Calcium: 7.2 mg/dL — ABNORMAL LOW (ref 8.9–10.3)
Chloride: 104 mmol/L (ref 98–111)
Creatinine, Ser: 8.22 mg/dL — ABNORMAL HIGH (ref 0.44–1.00)
GFR, Estimated: 5 mL/min — ABNORMAL LOW (ref >60)
Glucose, Bld: 110 mg/dL — ABNORMAL HIGH (ref 70–99)
Potassium: 4.3 mmol/L (ref 3.5–5.1)
Sodium: 137 mmol/L (ref 135–145)
Total Bilirubin: 1.2 mg/dL — ABNORMAL HIGH (ref <1.2)
Total Protein: 4.6 g/dL — ABNORMAL LOW (ref 6.5–8.1)

## 2023-02-03 LAB — CREATININE, URINE, RANDOM: Creatinine, Urine: 111 mg/dL

## 2023-02-03 LAB — MRSA NEXT GEN BY PCR, NASAL: MRSA by PCR Next Gen: NOT DETECTED

## 2023-02-03 LAB — GLUCOSE, CAPILLARY
Glucose-Capillary: 97 mg/dL (ref 70–99)
Glucose-Capillary: 116 mg/dL — ABNORMAL HIGH (ref 70–99)
Glucose-Capillary: 119 mg/dL — ABNORMAL HIGH (ref 70–99)
Glucose-Capillary: 149 mg/dL — ABNORMAL HIGH (ref 70–99)

## 2023-02-03 LAB — MAGNESIUM: Magnesium: 2.1 mg/dL (ref 1.7–2.4)

## 2023-02-03 LAB — SODIUM, URINE, RANDOM: Sodium, Ur: 31 mmol/L

## 2023-02-03 LAB — TSH: TSH: 3.548 u[IU]/mL (ref 0.350–4.500)

## 2023-02-03 LAB — AMMONIA: Ammonia: 19 umol/L (ref 9–35)

## 2023-02-03 MED ORDER — SODIUM CHLORIDE 0.9 % IV SOLN
250.0000 mL | INTRAVENOUS | Status: AC
  Administered 2023-02-03: 1000 mL via INTRAVENOUS

## 2023-02-03 MED ORDER — NOREPINEPHRINE 4 MG/250ML-% IV SOLN
2.0000 ug/min | INTRAVENOUS | Status: DC
  Administered 2023-02-03: 2 ug/min via INTRAVENOUS
  Administered 2023-02-04: 3 ug/min via INTRAVENOUS
  Administered 2023-02-04: 5 ug/min via INTRAVENOUS
  Administered 2023-02-05: 4 ug/min via INTRAVENOUS
  Administered 2023-02-06: 1 ug/min via INTRAVENOUS
  Administered 2023-02-08: 3 ug/min via INTRAVENOUS
  Administered 2023-02-09: 2 ug/min via INTRAVENOUS
  Filled 2023-02-03 (×7): qty 250

## 2023-02-03 MED ORDER — CHLORHEXIDINE GLUCONATE CLOTH 2 % EX PADS
6.0000 | MEDICATED_PAD | Freq: Every day | CUTANEOUS | Status: DC
  Administered 2023-02-03 – 2023-02-14 (×13): 6 via TOPICAL

## 2023-02-03 MED ORDER — ALBUMIN HUMAN 25 % IV SOLN
25.0000 g | Freq: Four times a day (QID) | INTRAVENOUS | Status: AC
  Administered 2023-02-03 (×2): 25 g via INTRAVENOUS
  Administered 2023-02-03: 12.5 g via INTRAVENOUS
  Administered 2023-02-04 (×3): 25 g via INTRAVENOUS
  Filled 2023-02-03 (×6): qty 100

## 2023-02-03 MED ORDER — LACTATED RINGERS IV BOLUS
500.0000 mL | Freq: Once | INTRAVENOUS | Status: AC
  Administered 2023-02-03: 500 mL via INTRAVENOUS

## 2023-02-03 MED ORDER — SODIUM CHLORIDE 0.9 % IV SOLN
50.0000 ug/h | INTRAVENOUS | Status: DC
  Administered 2023-02-03 – 2023-02-10 (×17): 50 ug/h via INTRAVENOUS
  Filled 2023-02-03 (×22): qty 1

## 2023-02-03 NOTE — Consult Note (Signed)
Nephrology Consult   Requesting provider: [facility] Service requesting consult: Hospitalist Reason for consult: AKI on CKD 3b   Assessment/Recommendations: [name] is a/an 72 y.o. female with a past medical history CKD 3B, DM2, GERD, NASH, HLD who presents with severe anuric AKI  Severe Anuric AKI on CKD3b: Baseline Crt ~2. 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  HRS may have been going on for a couple days and she could have ATN at this point although urinalysis not obvious for this.  Given the severity we will plan to be aggressive about improving her MAP.  Fortunately not uremic and does not need dialysis at this time -Start peripheral low-dose norepinephrine.  Goal MAP around at least 65 and ideally >70 -Stop IVFs and start albumin -Consider diagnostic para to rule out SBP (although seems unlikely based on presentation -Start octreotide -obtain urine Na -Continue to monitor daily Cr, Dose meds for GFR -Monitor Daily I/Os, Daily weight  -Maintain MAP>65 for optimal renal perfusion.  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  Consider hepatology involvement  DM2: Management per primary team  Anemia: Likely multifactorial.  Consider transfusion for hemoglobin less than 7.  Thrombocytopenia: Related to liver disease.  Management per primary team  Metabolic acidosis: Fairly mild with bicarb of 18.  Continue home sodium bicarbonate  UTI: Concern for this based on urinalysis.  However, urinalysis contained a lot of squamous epithelial cells.  Management per primary team    Recommendations conveyed to primary service.    Darnell Level [facility] 02/03/2023 9:58  AM   _____________________________________________________________________________________ CC: abnormal lab  History of Present Illness: [name] is a/an 72 y.o. female with a past medical history of CKD 3B, DM2, GERD, NASH, HLD who presents with concerns about an abnormal lab.  The patient states that she has been living in a facility.  She feels like they work are really hard but otherwise no significant complaints.  She was recently hospitalized for encephalopathy and was started on lactulose.  This was a couple weeks ago.  She states that she has had diarrhea since that time but no nausea or vomiting.  She went to a follow-up appointment with GI where they did labs and found her creatinine to be 8 and recommended she come back to the emergency department.  She last had an LVP on Monday where they removed 4 L and did give her some albumin as well.  She states that she was surprised to hear the lab was abnormal.  She was not feeling any differently.  Denied fevers, chills, abdominal pain, nausea, vomiting, dysuria, hematuria, bloody stools, NSAID use.  She has noted for the last day or so that she has not made much urine at all.  Appetite has been okay.  No significant confusion.  During her previous hospitalization for hepatic encephalopathy she was dehydrated as well.  She did receive ceftriaxone for concern of SBP.  In the emergency department creatinine was again found to be elevated.  She was given a lot of IV fluids and has not produced any significant urine.  She denies shortness of breath or chest pain at this time.  Blood pressure low on admission and continues to be fairly low.  Has improved somewhat with fluids  Medications:  Current Facility-Administered Medications  Medication Dose Route Frequency Provider Last Rate Last Admin   0.9 %  sodium chloride infusion   Intravenous Continuous Zierle-Ghosh, Asia B, DO 125 mL/hr at 02/03/23 0600 Infusion Verify at 02/03/23 0600    acetaminophen (TYLENOL) tablet 650 mg  650 mg Oral Q6H PRN Zierle-Ghosh, Asia B, DO       Or   acetaminophen (TYLENOL) suppository 650 mg  650 mg Rectal Q6H PRN Zierle-Ghosh, Asia B, DO       cefTRIAXone (ROCEPHIN) 1 g in sodium chloride 0.9 % 100 mL IVPB  1 g Intravenous Q24H Zierle-Ghosh, Asia B, DO       Chlorhexidine Gluconate Cloth 2 % PADS 6 each  6 each Topical Daily Zierle-Ghosh, Asia B, DO   6 each at 02/03/23 0915   insulin aspart (novoLOG) injection 0-15 Units  0-15 Units Subcutaneous TID WC Zierle-Ghosh, Asia B, DO       insulin aspart (novoLOG) injection 0-5 Units  0-5 Units Subcutaneous QHS Zierle-Ghosh, Asia B, DO       insulin detemir (LEVEMIR) injection 8 Units  8 Units Subcutaneous QHS Zierle-Ghosh, Asia B, DO       lactulose (CHRONULAC) 10 GM/15ML solution 10 g  10 g Oral Daily PRN Zierle-Ghosh, Asia B, DO       levothyroxine (SYNTHROID) tablet 125 mcg  125 mcg Oral Q0600 Zierle-Ghosh, Asia B, DO   125 mcg at 02/03/23 0433   nystatin (MYCOSTATIN/NYSTOP) topical powder 1 Application  1 Application Topical TID Zierle-Ghosh, Asia B, DO   1 Application at 02/03/23 0914   ondansetron (ZOFRAN) tablet 4 mg  4 mg Oral Q6H PRN Zierle-Ghosh, Asia B, DO       Or   ondansetron (ZOFRAN) injection 4 mg  4 mg Intravenous Q6H PRN Zierle-Ghosh, Asia B, DO       oxyCODONE (Oxy IR/ROXICODONE) immediate release tablet 5 mg  5 mg Oral Q4H PRN Zierle-Ghosh, Asia B, DO   5 mg at 02/02/23 2202   sodium bicarbonate tablet 1,300 mg  1,300 mg Oral BID Zierle-Ghosh, Asia B, DO   1,300 mg at 02/03/23 1610     ALLERGIES Bactrim [sulfamethoxazole-trimethoprim], Cipro [ciprofloxacin hcl], Codeine, Cozaar [losartan potassium], Lipitor [atorvastatin], Mevacor [lovastatin], Pravachol [pravastatin], Rosuvastatin, Statins, Welchol [colesevelam], Zestril [lisinopril], Zetia [ezetimibe], and Dilaudid [hydromorphone]  MEDICAL HISTORY Past Medical History:  Diagnosis Date   Alkaline phosphatase elevation     Anemia    Anemia in stage 4 chronic kidney disease (HCC) 10/15/2022   ASD (atrial septal defect)    Cataract    both eyes hx of   Chest pain    03-17-2013 last chest pain   Chronic kidney disease    Stage III kidney disease   Cirrhosis (HCC)    Depression    Diabetes mellitus type II    Family history of adverse reaction to anesthesia    Son hard to wake up   Fatigue    GERD (gastroesophageal reflux disease)    Gout    Headache(784.0)    occasional   Hyperlipidemia    Hypertension    Hypothyroidism    Neuropathy    Osteopenia    Peripheral neuropathy    Presence of pessary    Retinopathy      SOCIAL HISTORY Social History   Socioeconomic History   Marital status: Divorced    Spouse name: Not on file   Number of children: 3   Years of education: 48  Highest education level: Not on file  Occupational History   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 Reports that her son recently moved out after living with her for a long time. She reports that she is happy to be living alone and feels like she was enabling his behavior. Reports that he had a history of drug use.   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    No pets.   Eats all food groups.    Wears seat belt.    Lives in house.    Smoke detectors.    Social Determinants of Health   Financial Resource Strain: Medium Risk (10/30/2019)   Overall Financial Resource Strain (CARDIA)    Difficulty of Paying Living Expenses: Somewhat hard  Food Insecurity: No Food Insecurity (01/10/2023)    Hunger Vital Sign    Worried About Running Out of Food in the Last Year: Never true    Ran Out of Food in the Last Year: Never true  Transportation Needs: No Transportation Needs (01/10/2023)   PRAPARE - Administrator, Civil Service (Medical): No    Lack of Transportation (Non-Medical): No  Physical Activity: Inactive (10/30/2019)   Exercise Vital Sign    Days of Exercise per Week: 0 days    Minutes of Exercise per Session: 0 min  Stress: No Stress Concern Present (10/30/2019)   [employer] of Occupational Health - Occupational Stress Questionnaire    Feeling of Stress : Not at all  Social Connections: Moderately Integrated (10/30/2019)   Social Connection and Isolation Panel [NHANES]    Frequency of Communication with Friends and Family: More than three times a week    Frequency of Social Gatherings with Friends and Family: Three times a week    Attends Religious Services: More than 4 times per year    Active Member of Clubs or Organizations: Yes    Attends Banker Meetings: 1 to 4 times per year    Marital Status: Divorced  Intimate Partner Violence: Not At Risk (01/10/2023)   Humiliation, Afraid, Rape, and Kick questionnaire    Fear of Current or Ex-Partner: No    Emotionally Abused: No    Physically Abused: No    Sexually Abused: No     FAMILY HISTORY Family History  Problem Relation Age of Onset   COPD Mother    Lung cancer Father    Diabetes Sister    Cataracts Sister    Insulin resistance Daughter    Insulin resistance Son       Review of Systems: 12 systems reviewed Otherwise as per HPI, all other systems reviewed and negative  Physical Exam: Vitals:   02/03/23 0931 02/03/23 0932  BP:    Pulse: 71 72  Resp: 15 14  Temp:    SpO2: 99% 98%   No intake/output data recorded.  Intake/Output Summary (Last 24 hours) at 02/03/2023 0958 Last data filed at 02/03/2023 0600 Gross per 24  hour  Intake 3773.93 ml  Output 15 ml  Net 3758.93  ml   General: well-appearing, no acute distress HEENT: anicteric sclera, oropharynx clear without lesions CV: normal rate, no murmur Lungs: clear to auscultation bilaterally, normal work of breathing Abd: soft, non-tender, mild distention Skin: no visible lesions or rashes Psych: alert, engaged, appropriate mood and affect Musculoskeletal: trace ankle edema, no obvious deformities Neuro: normal speech, no gross focal deficits   Test Results Reviewed Lab Results  Component Value Date   NA 137 02/03/2023   K 4.3 02/03/2023   CL 104 02/03/2023   CO2 18 (L) 02/03/2023   BUN 89 (H) 02/03/2023   CREATININE 8.22 (H) 02/03/2023   CALCIUM 7.2 (L) 02/03/2023   ALBUMIN 2.6 (L) 02/03/2023   PHOS 3.1 01/15/2023    CBC Recent Labs  Lab 02/01/23 1234 02/02/23 1227 02/03/23 0323  WBC 4.9 4.6 4.1  NEUTROABS 3.3 3.1 2.9  HGB 8.7* 8.7* 8.3*  HCT 26.2* 26.8* 26.2*  MCV 101* 101.5* 101.9*  PLT 43* DCLMP 26*    I have reviewed all relevant outside healthcare records related to the patient's current hospitalization   The patient is critically ill with severe anuric aki and NASH and which includes my role to primarily manage severe anuric aki.  This requires high complexity decision making.  Total critical care time: 43 minutes    Critical care time was exclusive of treating other patients.   Critical care was necessary to treat or prevent imminent or life-threatening deterioration.   Critical care was time spent personally by me on the following activities:   development of treatment plan with patient and/or surrogate as well as nursing,   discussions with other provider evaluation of patient's response to treatment  examination of patient  obtaining history from patient or surrogate  ordering and performing treatments and interventions  ordering and review of laboratory studies  ordering and review of radiographic studies

## 2023-02-03 NOTE — TOC Initial Note (Signed)
Transition of Care [facility]) - Initial/Assessment Note    Patient Details  Name: [name] MRN: [id number] Date of Birth: Apr 10, 1950  [facility] CM/SW Contact:    Villa Herb, LCSWA Phone Number: 02/03/2023, 2:35 PM  Clinical Narrative:                 Pt is high risk for readmission. CSW spoke with pts son to complete assessment. Pt recently discharged back home from [facility] last Thursday. Pts son states HH with [facility] was set up and they have been coming into the home to work with pt. Pt has a walker, rollator, wheelchair, BSC and hospital bed to use in the home when needed. TOC to follow.   Expected Discharge Plan: Home w Home Health Services Barriers to Discharge: Continued Medical Work up   Patient Goals and CMS Choice Patient states their goals for this hospitalization and ongoing recovery are:: return home CMS Medicare.gov Compare Post Acute Care list provided to:: Patient Represenative (must comment) Choice offered to / list presented to : Patient, Adult Children [place] ownership interest in [facility].provided to:: Adult Children    Expected Discharge Plan and Services In-house Referral: Clinical Social Work Discharge Planning Services: CM Consult Post Acute Care Choice: Home Health Living arrangements for the past 2 months: Single Family Home                                      Prior Living Arrangements/Services Living arrangements for the past 2 months: Single Family Home Lives with:: Relatives Patient language and need for interpreter reviewed:: Yes Do you feel safe going back to the place where you live?: Yes      Need for Family Participation in Patient Care: Yes (Comment) Care giver support system in place?: Yes (comment) Current home services: DME Criminal Activity/Legal Involvement Pertinent to Current Situation/Hospitalization: No - Comment as needed  Activities of Daily Living   ADL Screening (condition  at time of admission) Independently performs ADLs?: No Does the patient have a NEW difficulty with bathing/dressing/toileting/self-feeding that is expected to last >3 days?: No Does the patient have a NEW difficulty with getting in/out of bed, walking, or climbing stairs that is expected to last >3 days?: No Does the patient have a NEW difficulty with communication that is expected to last >3 days?: No Is the patient deaf or have difficulty hearing?: Yes Does the patient have difficulty seeing, even when wearing glasses/contacts?: No Does the patient have difficulty concentrating, remembering, or making decisions?: No  Permission Sought/Granted                  Emotional Assessment Appearance:: Appears stated age Attitude/Demeanor/Rapport: Engaged Affect (typically observed): Accepting   Alcohol / Substance Use: Not Applicable Psych Involvement: No (comment)  Admission diagnosis:  AKI (acute kidney injury) (HCC) [N17.9] Patient Active Problem List   Diagnosis Date Noted   AKI (acute kidney injury) (HCC) 02/02/2023   Anemia due to chronic kidney disease 02/02/2023   UTI (urinary tract infection) 02/02/2023   Hypotension 02/02/2023   Esophageal varices (HCC) 01/28/2023   Hepatic encephalopathy syndrome (HCC) 01/17/2023   Pressure injury of skin 01/15/2023   SBP (spontaneous bacterial peritonitis) (HCC) 01/14/2023   Encephalopathy, hepatic (HCC) 01/14/2023   Acute hepatic encephalopathy (HCC) 01/10/2023   Ascites 01/10/2023   Anemia in stage 4 chronic kidney disease (HCC) 10/15/2022  Complete prolapse of vaginal vault 09/15/2022   Urinary retention 09/15/2022   Vaginal pessary present 09/15/2022   Elevated brain natriuretic peptide (BNP) level 09/03/2022   Hypoalbuminemia due to protein-calorie malnutrition (HCC) 09/03/2022   Prolonged QT interval 08/25/2022   Vaginal prolapse 08/25/2022   Acute kidney injury superimposed on stage 4 chronic kidney disease (HCC) 08/25/2022    Obesity 06/16/2022   Cirrhosis, non-alcoholic (HCC) 12/04/2020   Vitamin D deficiency 11/26/2020   Mixed hyperlipidemia 11/26/2020   Gout 11/26/2020   History of thrombocytopenia 11/26/2020   Thrombocytopenia (HCC) 07/30/2017   CKD stage 3 due to type 2 diabetes mellitus (HCC) 06/30/2017   Liver cirrhosis secondary to NASH (HCC) 06/30/2017   Hypertension with renal disease 03/19/2017   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/19/2017   Tremor 08/06/2016   Vitreous hemorrhage of left eye (HCC) 01/29/2015   Diabetic macular edema (HCC) 12/25/2014   Hypertensive retinopathy of both eyes 12/25/2014   Proliferative diabetic retinopathy (HCC) 08/04/2011   Bilateral nondiabetic proliferative retinopathy 04/03/2011   Type 2 diabetes mellitus with diabetic chronic kidney disease (HCC) 02/05/2010   Acquired hypothyroidism 03/20/2008   RENAL DISEASE, CHRONIC, STAGE II 03/20/2008   Hereditary and idiopathic peripheral neuropathy 02/21/2008   Pseudophakia of both eyes 02/21/2008   GERD 02/21/2008   PCP:  Benita Stabile, MD Pharmacy:   [facility] [address], [place] - [address] [place] [address] [place] [address] Phone: [phone] Fax: [phone]     Social Determinants of Health (SDOH) Social History: SDOH Screenings   Food Insecurity: No Food Insecurity (01/10/2023)  Housing: Low Risk  (01/10/2023)  Transportation Needs: No Transportation Needs (01/10/2023)  Utilities: Not At Risk (01/10/2023)  Alcohol Screen: Low Risk  (10/30/2019)  Depression (PHQ2-9): Medium Risk (10/30/2019)  Financial Resource Strain: Medium Risk (10/30/2019)  Physical Activity: Inactive (10/30/2019)  Social Connections: Moderately Integrated (10/30/2019)  Stress: No Stress Concern Present (10/30/2019)  Tobacco Use: Low Risk  (02/02/2023)   SDOH Interventions:     Readmission Risk Interventions    02/03/2023    2:33 PM 01/11/2023    8:51 AM 09/04/2022   12:38 PM  Readmission  Risk Prevention Plan  Transportation Screening Complete Complete Complete  HRI or Home Care Consult Complete Complete Complete  Social Work Consult for Recovery Care Planning/Counseling Complete Complete Complete  Palliative Care Screening Not Applicable Not Applicable Complete  Medication Review Oceanographer) Complete Complete Complete

## 2023-02-03 NOTE — Progress Notes (Signed)
PROGRESS NOTE   [name]  WUJ:[id number] DOB: 05-22-1950 DOA: 02/02/2023 PCP: Benita Stabile, MD   Chief Complaint  Patient presents with   Abnormal Lab   Level of care: ICU  Brief Admission History:   72 y.o. female with medical history significant of anemia, CKD, is mellitus type II, depression, GERD, hyperlipidemia, hypertension, hypothyroidism, retinopathy, and more presents the ED with a chief complaint of abnormal labs.  Patient reports she woke up feeling like her normal self, and everybody was telling her that she needed to go to the ER.  Apparently patient had recently been admitted for hepatic encephalopathy.  She was discharged on October 22.  She had a follow-up appointment with GI where they drew routine labs.  They called her and told her that she needed to come into the ER due to her BUN and creatinine.  Patient reports she is in no pain at the time of my exam.  She reports she has been having diarrhea but that is normal for her.  I would expect her to have diarrhea because she is on lactulose.  She denies any dizziness, chest pain, palpitations, shortness of breath.  Patient reports she is not sure why she is here.  On chart review patient recently discharged after a 9-day stay in the hospital.  She was found to have hepatic encephalopathy at that time.  She had no history of hepatic encephalopathy.  Her ammonia was as high as 130.  She also appeared to be dehydrated.  Her symptoms improved with a lactulose enema.  She completed IV ceftriaxone x 5 days for spontaneous bacterial peritonitis.  Her ammonia was down to 47 at discharge.  She did have a paracentesis on October 14 with 5.5 L of cloudy ascitic fluid removed.  Patient was sent home on lactulose to be titrated to achieve 2-3 mushy stools per day per discharge summary.  Patient had an acute kidney injury at that admission as well.  Her creatinine peaked at 2.77 at that time.  She was discharged on ciprofloxacin from that  admission.  Patient has no other complaints at this time.   Patient does not smoke and does not drink.  Patient reports she would want to be full code.  Son at bedside reports that she had a DNR in the past, but revoked it.  Patient is full code.     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 Appreciate Dr. Valentino Nose recommendation - Continue home bicarb - Likely related to hypovolemia with recent diarrhea and currently on diuretic - Avoid nephrotoxic agents when possible - follow renal function panel   Hypotension - Blood pressure as low as 85/36 at presentation - Family reports that blood pressure does run low at home - After 3 L of fluid her systolic is still in the low 100s - Monitor on stepdown - Continue IV fluid - Hold Inderal and torsemide - Nephrology started on IV albumin and IV norepinephrine infusion for goal MAP>65   UTI (urinary tract infection) - UA indicative of UTI - Urine culture pending - Appears that she was recently on Cipro outpatient - Rocephin started in the ED - Continue Rocephin x 3 doses  - CT abdomen pelvis did not show any obstructive uropathy or calculi present - could be a contaminated specimen  given squamous cells  Anemia due to chronic kidney disease - Hemoglobin stable at 8.7 - Likely related to chronic disease - Trend in the a.m. - No active bleeding reported  Thrombocytopenia - severe  - History of thrombocytopenia - Platelet count down to 26 - Hold pharmaceutical DVT prophylaxis - Continue to monitor - due to chronic liver disease   Liver cirrhosis secondary to NASH - Reducing lactulose to as needed for mild constipation given patient's hypovolemic status and AKI - CT did redemonstrate cirrhosis and portal venous hypertension as well as ascites - Continue to  monitor  Type 2 diabetes mellitus with diabetic chronic kidney disease  - Sliding scale coverage (renal sensitive) - Monitoring CBG CBG (last 3)  Recent Labs    02/03/23 0741 02/03/23 1140 02/03/23 1609  GLUCAP 97 116* 119*   Acquired hypothyroidism - Continue Synthroid  DVT prophylaxis: SCDs Code Status: Full  Family Communication: son at bedside 11/6  Disposition: TBD    Consultants:  Nephrology  Procedures:   Antimicrobials:    Subjective: Pt reporting that she has no current pain or discomfort, no CP or SOB.  She is NOT urinating which is worrying her.    Objective: Vitals:   02/03/23 1500 02/03/23 1600 02/03/23 1615 02/03/23 1630  BP: (!) 107/35 (!) 126/90 (!) 128/45 (!) 124/48  Pulse: 73 73 77 79  Resp: 15 18 19 20   Temp:      TempSrc:      SpO2: 98% 98% 99% 96%    Intake/Output Summary (Last 24 hours) at 02/03/2023 1718 Last data filed at 02/03/2023 1654 Gross per 24 hour  Intake 4007 ml  Output 30 ml  Net 3977 ml   There were no vitals filed for this visit. Examination:  General exam: chronically ill appearing female, awake, Appears calm and comfortable  Respiratory system: Clear to auscultation. Respiratory effort normal. Cardiovascular system: normal S1 & S2 heard. No JVD, murmurs, rubs, gallops or clicks. No pedal edema. Gastrointestinal system: Abdomen is mildly distended, soft and nontender. No fluid wave. No masses felt. Normal bowel sounds heard. Central nervous system: Alert and oriented. No focal neurological deficits. Extremities: Symmetric 5 x 5 power. Skin: No rashes, lesions or ulcers. Psychiatry: Judgement and insight appear normal. Mood & affect appropriate.   Data Reviewed: I have personally reviewed following labs and imaging studies  CBC: Recent Labs  Lab 02/01/23 1234 02/02/23 1227 02/03/23 0323  WBC 4.9 4.6 4.1  NEUTROABS 3.3 3.1 2.9  HGB 8.7* 8.7* 8.3*  HCT 26.2* 26.8* 26.2*  MCV 101* 101.5* 101.9*  PLT 43* DCLMP 26*     Basic Metabolic Panel: Recent Labs  Lab 02/01/23 1234 02/02/23 1227 02/03/23 0323  NA 140 137 137  K 4.4 4.0 4.3  CL 100 104 104  CO2 17* 20* 18*  GLUCOSE 203* 95 110*  BUN 86* 98* 89*  CREATININE 7.26* 8.55* 8.22*  CALCIUM 7.6* 7.5* 7.2*  MG  --   --  2.1    CBG: Recent Labs  Lab 02/02/23 2050 02/03/23 0741 02/03/23 1140 02/03/23 1609  GLUCAP 110* 97 116* 119*    Recent Results (from the past 240 hour(s))  Urine Culture     Status: None   Collection Time: 02/02/23  2:55 PM   Specimen: Urine, Clean Catch  Result Value Ref Range Status   Specimen Description   Final    URINE, CLEAN CATCH Performed at [facility], [address], [place], [place] [address]  Special Requests   Final    NONE Performed at [facility], [address] [place], [place] [address]    Culture   Final    NO GROWTH Performed at [facility], [address] [place], [place] [address]    Report Status 02/03/2023 FINAL  Final  MRSA Next Gen by PCR, Nasal     Status: None   Collection Time: 02/02/23  8:08 PM   Specimen: Nasal Mucosa; Nasal Swab  Result Value Ref Range Status   MRSA by PCR Next Gen NOT DETECTED NOT DETECTED Final    Comment: (NOTE) The GeneXpert MRSA Assay (FDA approved for NASAL specimens only), is one component of a comprehensive MRSA colonization surveillance program. It is not intended to diagnose MRSA infection nor to guide or monitor treatment for MRSA infections. Test performance is not FDA approved in patients less than 26 years old. Performed at [facility], [address], [place], [place] [address]      Radiology Studies: [place] EKG SITE RITE  Result Date: 02/03/2023 If Site Rite image not attached, placement could not be confirmed due to current cardiac rhythm.  US RENAL  Result Date: 02/03/2023 CLINICAL DATA:  Acute kidney injury EXAM: RENAL / URINARY TRACT ULTRASOUND COMPLETE COMPARISON:  None Available. FINDINGS: Right Kidney: Renal  measurements: 9.1 x 4.5 x 4.5 cm = volume: 96.2 mL. Echogenicity within normal limits. No mass or hydronephrosis visualized. Left Kidney: Renal measurements: 8.6 x 4.7 x 4.2 cm = volume: 89.4 mL. Echogenicity within normal limits. No mass or hydronephrosis visualized. Bladder: Appears normal for degree of bladder distention. Other: Cirrhotic liver morphology abdominal ascites. IMPRESSION: No hydronephrosis. Electronically Signed   By: Allegra Lai M.D.   On: 02/03/2023 12:06   CT ABDOMEN PELVIS WO CONTRAST  Result Date: 02/02/2023 CLINICAL DATA:  Dizziness. Abnormal renal function. Abdominal/flank pain. EXAM: CT ABDOMEN AND PELVIS WITHOUT CONTRAST TECHNIQUE: Multidetector CT imaging of the abdomen and pelvis was performed following the standard protocol without IV contrast. RADIATION DOSE REDUCTION: This exam was performed according to the departmental dose-optimization program which includes automated exposure control, adjustment of the mA and/or kV according to patient size and/or use of iterative reconstruction technique. COMPARISON:  09/04/2022 FINDINGS: Lower chest: Left lower lobe subpleural scarring or atelectasis. Mild cardiomegaly with right coronary artery calcification. Hepatobiliary: Advanced cirrhosis. Cholecystectomy, without biliary ductal dilatation. Pancreas: Fatty replaced pancreas. Spleen: Splenomegaly is mild at 14 cm. Adrenals/Urinary Tract: Normal right adrenal gland. Left adrenal not well visualized secondary to left upper quadrant varices. Bilateral renal cortical thinning. Left renal vascular calcifications. No renal calculi or hydronephrosis. No hydroureter or ureteric calculi. Decompressed urinary bladder, without bladder calculi. A calcification along the left bladder wall is favored to be pericystic and vascular. Stomach/Bowel: The stomach is primarily underdistended. Normal colon and terminal ileum. Normal small bowel. Vascular/Lymphatic: Aortic atherosclerosis. Portal venous  hypertension, with left upper quadrant portosystemic collaterals. No abdominopelvic adenopathy. Reproductive: Hysterectomy.  No adnexal mass. Other: Pessary in place. Residual pelvic floor laxity. Moderate volume abdominopelvic ascites is similar. No free intraperitoneal air. Anasarca Musculoskeletal: Osteopenia. Mild superior endplate compression deformity at L4 is unchanged. IMPRESSION: 1. No urinary tract calculi or obstructive uropathy. Otherwise low sensitivity exam secondary to stone study technique. 2. Cirrhosis and portal venous hypertension with moderate volume abdominopelvic ascites. 3. Aortic Atherosclerosis (ICD10-I70.0). Coronary artery atherosclerosis. Electronically Signed   By: Jeronimo Greaves M.D.   On: 02/02/2023 16:19    Scheduled Meds:  Chlorhexidine Gluconate Cloth  6  each Topical Daily   insulin aspart  0-15 Units Subcutaneous TID WC   insulin aspart  0-5 Units Subcutaneous QHS   insulin detemir  8 Units Subcutaneous QHS   levothyroxine  125 mcg Oral Q0600   nystatin  1 Application Topical TID   sodium bicarbonate  1,300 mg Oral BID   Continuous Infusions:  sodium chloride 10 mL/hr at 02/03/23 1654   albumin human Stopped (02/03/23 1354)   cefTRIAXone (ROCEPHIN)  IV     norepinephrine (LEVOPHED) Adult infusion 1 mcg/min (02/03/23 1654)   octreotide (SANDOSTATIN) 500 mcg in sodium chloride 0.9 % 250 mL (2 mcg/mL) infusion 50 mcg/hr (02/03/23 1654)     LOS: 1 day   Critical Care Procedure Note Authorized and Performed by: Maryln Manuel MD  Total Critical Care time:  62 mins Due to a high probability of clinically significant, life threatening deterioration, the patient required my highest level of preparedness to intervene emergently and I personally spent this critical care time directly and personally managing the patient.  This critical care time included obtaining a history; examining the patient, pulse oximetry; ordering and review of studies; arranging urgent treatment  with development of a management plan; evaluation of patient's response of treatment; frequent reassessment; and discussions with other providers.  This critical care time was performed to assess and manage the high probability of imminent and life threatening deterioration that could result in multi-organ failure.  It was exclusive of separately billable procedures and treating other patients and teaching time.    Standley Dakins, MD How to contact the [facility] Attending or Consulting provider 7A - 7P or covering provider during after hours 7P -7A, for this patient?  Check the care team in [facility] and look for a) attending/consulting [facility] provider listed and b) the [facility] team listed Log into [email or web address] to find provider on call.  Locate the [facility] provider you are looking for under [facility] and page to a number that you can be directly reached. If you still have difficulty reaching the provider, please page the [facility] (Director on Call) for the Hospitalists listed on amion for assistance.  02/03/2023, 5:18 PM

## 2023-02-03 NOTE — Progress Notes (Signed)
Critical low PLT 26, Dr. Thomes Dinning notified.  Also updated on lower BPs with MAP 60.  Previous LR 500 bolus given with no improvement earlier in night.  No new orders given. Continue to monitor.

## 2023-02-03 NOTE — Hospital Course (Signed)
72 y.o. female with medical history significant of anemia, CKD, is mellitus type II, depression, GERD, hyperlipidemia, hypertension, hypothyroidism, retinopathy, and more presents the ED with a chief complaint of abnormal labs.  Patient reports she woke up feeling like her normal self, and everybody was telling her that she needed to go to the ER.  Apparently patient had recently been admitted for hepatic encephalopathy.  She was discharged on October 22.  She had a follow-up appointment with GI where they drew routine labs.  They called her and told her that she needed to come into the ER due to her BUN and creatinine.  Patient reports she is in no pain at the time of my exam.  She reports she has been having diarrhea but that is normal for her.  I would expect her to have diarrhea because she is on lactulose.  She denies any dizziness, chest pain, palpitations, shortness of breath.  Patient reports she is not sure why she is here.  On chart review patient recently discharged after a 9-day stay in the hospital.  She was found to have hepatic encephalopathy at that time.  She had no history of hepatic encephalopathy.  Her ammonia was as high as 130.  She also appeared to be dehydrated.  Her symptoms improved with a lactulose enema.  She completed IV ceftriaxone x 5 days for spontaneous bacterial peritonitis.  Her ammonia was down to 47 at discharge.  She did have a paracentesis on October 14 with 5.5 L of cloudy ascitic fluid removed.  Patient was sent home on lactulose to be titrated to achieve 2-3 mushy stools per day per discharge summary.  Patient had an acute kidney injury at that admission as well.  Her creatinine peaked at 2.77 at that time.  She was discharged on ciprofloxacin from that admission.  Patient has no other complaints at this time.   Patient does not smoke and does not drink.  Patient reports she would want to be full code.  Son at bedside reports that she had a DNR in the past, but revoked  it.  Patient is full code.

## 2023-02-04 ENCOUNTER — Inpatient Hospital Stay: Payer: Medicare Other

## 2023-02-04 DIAGNOSIS — E861 Hypovolemia: Secondary | ICD-10-CM | POA: Diagnosis not present

## 2023-02-04 DIAGNOSIS — N179 Acute kidney failure, unspecified: Secondary | ICD-10-CM | POA: Diagnosis not present

## 2023-02-04 DIAGNOSIS — N185 Chronic kidney disease, stage 5: Secondary | ICD-10-CM | POA: Diagnosis not present

## 2023-02-04 DIAGNOSIS — K767 Hepatorenal syndrome: Secondary | ICD-10-CM

## 2023-02-04 DIAGNOSIS — D696 Thrombocytopenia, unspecified: Secondary | ICD-10-CM

## 2023-02-04 DIAGNOSIS — R791 Abnormal coagulation profile: Secondary | ICD-10-CM

## 2023-02-04 DIAGNOSIS — K7581 Nonalcoholic steatohepatitis (NASH): Secondary | ICD-10-CM | POA: Diagnosis not present

## 2023-02-04 DIAGNOSIS — D631 Anemia in chronic kidney disease: Secondary | ICD-10-CM

## 2023-02-04 DIAGNOSIS — N390 Urinary tract infection, site not specified: Secondary | ICD-10-CM | POA: Diagnosis not present

## 2023-02-04 LAB — RENAL FUNCTION PANEL
Albumin: 3.6 g/dL (ref 3.5–5.0)
Anion gap: 16 — ABNORMAL HIGH (ref 5–15)
BUN: 91 mg/dL — ABNORMAL HIGH (ref 8–23)
CO2: 18 mmol/L — ABNORMAL LOW (ref 22–32)
Calcium: 7.4 mg/dL — ABNORMAL LOW (ref 8.9–10.3)
Chloride: 103 mmol/L (ref 98–111)
Creatinine, Ser: 8.45 mg/dL — ABNORMAL HIGH (ref 0.44–1.00)
GFR, Estimated: 5 mL/min — ABNORMAL LOW (ref >60)
Glucose, Bld: 128 mg/dL — ABNORMAL HIGH (ref 70–99)
Phosphorus: 9.3 mg/dL — ABNORMAL HIGH (ref 2.5–4.6)
Potassium: 4.3 mmol/L (ref 3.5–5.1)
Sodium: 137 mmol/L (ref 135–145)

## 2023-02-04 LAB — CBC
HCT: 23.7 % — ABNORMAL LOW (ref 36.0–46.0)
Hemoglobin: 7.6 g/dL — ABNORMAL LOW (ref 12.0–15.0)
MCH: 32.6 pg (ref 26.0–34.0)
MCHC: 32.1 g/dL (ref 30.0–36.0)
MCV: 101.7 fL — ABNORMAL HIGH (ref 80.0–100.0)
Platelets: 34 10*3/uL — ABNORMAL LOW (ref 150–400)
RBC: 2.33 MIL/uL — ABNORMAL LOW (ref 3.87–5.11)
RDW: 16.2 % — ABNORMAL HIGH (ref 11.5–15.5)
WBC: 5.4 10*3/uL (ref 4.0–10.5)
nRBC: 0 % (ref 0.0–0.2)

## 2023-02-04 LAB — HEPATIC FUNCTION PANEL
ALT: 16 U/L (ref 0–44)
AST: 22 U/L (ref 15–41)
Albumin: 3.7 g/dL (ref 3.5–5.0)
Alkaline Phosphatase: 59 U/L (ref 38–126)
Bilirubin, Direct: 0.4 mg/dL — ABNORMAL HIGH (ref 0.0–0.2)
Indirect Bilirubin: 0.9 mg/dL (ref 0.3–0.9)
Total Bilirubin: 1.3 mg/dL — ABNORMAL HIGH (ref <1.2)
Total Protein: 5.3 g/dL — ABNORMAL LOW (ref 6.5–8.1)

## 2023-02-04 LAB — GLUCOSE, CAPILLARY
Glucose-Capillary: 135 mg/dL — ABNORMAL HIGH (ref 70–99)
Glucose-Capillary: 109 mg/dL — ABNORMAL HIGH (ref 70–99)
Glucose-Capillary: 181 mg/dL — ABNORMAL HIGH (ref 70–99)
Glucose-Capillary: 163 mg/dL — ABNORMAL HIGH (ref 70–99)

## 2023-02-04 LAB — PROTIME-INR
INR: 1.7 — ABNORMAL HIGH (ref 0.8–1.2)
Prothrombin Time: 20.1 s — ABNORMAL HIGH (ref 11.4–15.2)

## 2023-02-04 LAB — MAGNESIUM: Magnesium: 2.1 mg/dL (ref 1.7–2.4)

## 2023-02-04 MED ORDER — SEVELAMER CARBONATE 800 MG PO TABS
800.0000 mg | ORAL_TABLET | Freq: Three times a day (TID) | ORAL | Status: DC
  Administered 2023-02-04 – 2023-02-09 (×13): 800 mg via ORAL
  Filled 2023-02-04 (×15): qty 1

## 2023-02-04 MED ORDER — ZINC OXIDE 40 % EX OINT
TOPICAL_OINTMENT | Freq: Three times a day (TID) | CUTANEOUS | Status: DC
  Administered 2023-02-09: 1 via TOPICAL
  Filled 2023-02-04: qty 57

## 2023-02-04 MED ORDER — LIDOCAINE 5 % EX PTCH
1.0000 | MEDICATED_PATCH | CUTANEOUS | Status: DC
  Administered 2023-02-04 – 2023-02-13 (×9): 1 via TRANSDERMAL
  Filled 2023-02-04 (×11): qty 1

## 2023-02-04 MED ORDER — VITAMIN K1 10 MG/ML IJ SOLN
10.0000 mg | Freq: Every day | INTRAVENOUS | Status: AC
  Administered 2023-02-04 – 2023-02-06 (×3): 10 mg via INTRAVENOUS
  Filled 2023-02-04 (×3): qty 1

## 2023-02-04 NOTE — Consult Note (Addendum)
Gastroenterology Consult   Referring Provider: No ref. provider found Primary Care Physician:  Benita Stabile, MD Primary Gastroenterologist:  Dr. Loreta Ave, transitioned to Dr. 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 female with history of [name] cirrhosis, nonbleeding esophageal varices status post banding, SBP, ascites requiring repeated paracenteses, diabetes, hypertension, CKD, chronic anemia recently seen in the GI office on October 31 for follow-up of hospitalization for SBP and hepatic encephalopathy.  Previously followed with Dr. Loreta Ave but according to the patient she is not available in the short-term.  Recent admission for SBP, received 5 days of IV antibiotics and discharged on Cipro for prophylaxis.  Also with hepatic encephalopathy triggered by SBP, started on lactulose. At time of her discharge, her creatinine was 1.97 at baseline. Discharged home Restarted on torsemide 20 mg daily.  Discharged home with instructions to stop her beta blocker. started lactulose 3 times daily and continued torsemide 20mg  daily.  Outpatient labs on November 4 with significant change in creatinine over the past 2 weeks going from 1.97-7.26.  BUN 86.  Advised to go to the ED, admitted November 5.  In the ED creatinine was 8.55, BUN 98.  Lactic acid 2.1.  CT abdomen pelvis without contrast in the ED no evidence of urinary tract calculi or obstructive uropathy.  Moderate volume abdominal pelvic ascites.  Renal ultrasound with no hydronephrosis.    Patient reports anuria for couple of days prior to admission. Since admission nephrology on board. She has been receiving IV albumin 25g every 6 hours, Levophed,  octreotide. She denies any abdominal pain. Appetite fair. No heartburn. Having a lot of diarrhea, lactulose currently prn only. Patient states at home, taking one tablespoon daily she was having 2-3 stools daily. No melena, brbpr.   Notably since her last discharge, she has had two paracenteses on 10/28 and 11/4, 4 liters removed each time. Since September requiring weekly paras.   Today her creatinine is 8.45, BUN 91, potassium 4.3, hemoglobin 7.6, MCV 101.7, platelets 34,000, magnesium 2.1, INR 1.7, albumin 3.7, total bilirubin 1.3, direct bilirubin 0.4, alk phos 59, AST 22, ALT 16.   Cirrhosis related questions: Hematemesis/coffee ground emesis: No Abdominal pain: No Abdominal distention/worsening ascitesYes Fever/chills: No Episodes of confusion/disorientation: Not recently Number of daily bowel movements:per nursing "a lot" but not recorded Taking diuretics?: torsemine 20 mg qday (on hold) History of variceal bleeding: No Prior history of banding?: Yes Prior episodes of SBP: yes, on ciprofloxacin Last time liver imaging was performed:12/18/20- [place], no masses Last AFP: 02/01/23, <1.8 MELD 3.0 score: 02/04/23  - 26 (up from 24 last month) Currently consuming alcohol: No Hepatitis A and B vaccination status: Hep A immune (02/01/23), Hep B nonimmune (02/01/23)     Last EGD: 10/31/21 Dr. Elnoria Howard - Large ( > 5 mm) esophageal varices. Incompletely eradicated. Banded. - Portal hypertensive gastropathy. - Normal examined duodenum.  Last Colonoscopy: 01/18/2017 Dr. Loreta Ave One 8 mm polyp in [facility].  Recommended repeat colonoscopy in 5 years     Prior to Admission medications   Medication Sig Start Date End Date Taking? Authorizing Provider  acetaminophen (TYLENOL) 500 MG tablet Take 500 mg by mouth every 6 (six) hours as needed for  mild pain (pain score 1-3).   Yes [provider]  ciprofloxacin (CIPRO) 500 MG tablet Take 1 tablet (500 mg total) by mouth daily with breakfast. 01/20/23  Yes  Emokpae, Courage, MD  insulin NPH Human (NOVOLIN N RELION) 100 UNIT/ML injection Inject 16 Units into the skin in the morning and at bedtime.   Yes [provider]  insulin regular (NOVOLIN R RELION) 100 units/mL injection Inject 5 Units into the skin 3 (three) times daily before meals.   Yes [provider]  lactose free nutrition (BOOST) LIQD Take 237 mLs by mouth daily.   Yes [provider]  lactulose (CHRONULAC) 10 GM/15ML solution Take 15 mLs (10 g total) by mouth 3 (three) times daily. Titrate to have 2-3 mushy stools per day Patient taking differently: Take 10 g by mouth daily as needed for mild constipation. 01/19/23  Yes Shon Hale, MD  levothyroxine (SYNTHROID) 125 MCG tablet Take 125 mcg by mouth daily before breakfast. 09/03/22  Yes [provider]  nystatin (MYCOSTATIN/NYSTOP) powder Apply 1 Application topically 3 (three) times daily. Patient taking differently: Apply 1 Application topically daily. 01/19/23  Yes Emokpae, Courage, MD  propranolol (INDERAL) 20 MG tablet Take 1 tablet (20 mg total) by mouth daily. 01/28/23  Yes Dolores Frame, MD  sodium bicarbonate 650 MG tablet Take 2 tablets (1,300 mg total) by mouth 2 (two) times daily. 09/10/22 09/10/23 Yes Emokpae, Courage, MD  torsemide (DEMADEX) 20 MG tablet Take 1 tablet (20 mg total) by mouth daily. 01/19/23  Yes Emokpae, Courage, MD  Vitamin D, Ergocalciferol, (DRISDOL) 1.25 MG (50000 UT) CAPS capsule Take 50,000 Units by mouth every Wednesday. 11/16/18  Yes [provider]    Current Facility-Administered Medications  Medication Dose Route Frequency Provider Last Rate Last Admin   0.9 %  sodium chloride infusion  250 mL Intravenous Continuous Darnell Level, MD   Stopped at 02/04/23 0747   acetaminophen (TYLENOL) tablet 650 mg  650 mg Oral Q6H PRN Zierle-Ghosh, Asia B, DO   650 mg at 02/04/23 1021   Or   acetaminophen (TYLENOL) suppository 650 mg  650 mg Rectal  Q6H PRN Zierle-Ghosh, Asia B, DO       albumin human 25 % solution 25 g  25 g Intravenous Q6H Darnell Level, MD 60 mL/hr at 02/04/23 1014 25 g at 02/04/23 1014   cefTRIAXone (ROCEPHIN) 1 g in sodium chloride 0.9 % 100 mL IVPB  1 g Intravenous Q24H Johnson, Clanford L, MD 200 mL/hr at 02/04/23 3244 Infusion Verify at 02/04/23 0102   Chlorhexidine Gluconate Cloth 2 % PADS 6 each  6 each Topical Daily Zierle-Ghosh, Asia B, DO   6 each at 02/04/23 0917   insulin aspart (novoLOG) injection 0-15 Units  0-15 Units Subcutaneous TID WC Zierle-Ghosh, Asia B, DO   2 Units at 02/04/23 0819   insulin aspart (novoLOG) injection 0-5 Units  0-5 Units Subcutaneous QHS Zierle-Ghosh, Asia B, DO       insulin detemir (LEVEMIR) injection 8 Units  8 Units Subcutaneous QHS Zierle-Ghosh, Asia B, DO   8 Units at 02/03/23 2036   lactulose (CHRONULAC) 10 GM/15ML solution 10 g  10 g Oral Daily PRN Zierle-Ghosh, Asia B, DO       levothyroxine (SYNTHROID) tablet 125 mcg  125 mcg Oral Q0600 Zierle-Ghosh, Asia B, DO   125 mcg at 02/04/23 0633   lidocaine (LIDODERM) 5 % 1 patch  1 patch Transdermal Q24H Cleora Fleet, MD  liver oil-zinc oxide (DESITIN) 40 % ointment   Topical TID Johnson, Clanford L, MD       norepinephrine (LEVOPHED) 4mg  in (0.016 mg/mL) premix infusion  2-10 mcg/min Intravenous Titrated Darnell Level, MD 18.75 mL/hr at 02/04/23 0822 5 mcg/min at 02/04/23 1610   nystatin (MYCOSTATIN/NYSTOP) topical powder 1 Application  1 Application Topical TID Zierle-Ghosh, Asia B, DO   1 Application at 02/04/23 0820   octreotide (SANDOSTATIN) 500 mcg in sodium chloride 0.9 % 250 mL (2 mcg/mL) infusion  50 mcg/hr Intravenous Continuous Darnell Level, MD 25 mL/hr at 02/04/23 0822 50 mcg/hr at 02/04/23 0822   ondansetron (ZOFRAN) tablet 4 mg  4 mg Oral Q6H PRN Zierle-Ghosh, Asia B, DO       Or   ondansetron (ZOFRAN) injection 4 mg  4 mg Intravenous Q6H PRN Zierle-Ghosh, Asia B, DO       oxyCODONE (Oxy  IR/ROXICODONE) immediate release tablet 5 mg  5 mg Oral Q4H PRN Zierle-Ghosh, Asia B, DO   5 mg at 02/02/23 2202   sevelamer carbonate (RENVELA) tablet 800 mg  800 mg Oral TID WC Darnell Level, MD       sodium bicarbonate tablet 1,300 mg  1,300 mg Oral BID Zierle-Ghosh, Asia B, DO   1,300 mg at 02/04/23 0917    Allergies as of 02/02/2023 - Review Complete 02/02/2023  Allergen Reaction Noted   Bactrim [sulfamethoxazole-trimethoprim] Nausea And Vomiting and Other (See Comments) 09/03/2022   Cipro [ciprofloxacin hcl] Other (See Comments)    Codeine Other (See Comments) 10/19/2018   Cozaar [losartan potassium] Cough 11/25/2020   Lipitor [atorvastatin] Other (See Comments) 11/25/2020   Mevacor [lovastatin] Other (See Comments) 11/25/2020   Pravachol [pravastatin] Other (See Comments) 09/18/2022   Rosuvastatin Diarrhea and Other (See Comments) 11/25/2020   Statins Other (See Comments) 11/25/2020   Welchol [colesevelam] Other (See Comments) 11/25/2020   Zestril [lisinopril] Cough 11/25/2020   Zetia [ezetimibe] Other (See Comments) 11/25/2020   Dilaudid [hydromorphone] Nausea And Vomiting and Other (See Comments) 03/31/2013    Past Medical History:  Diagnosis Date   Alkaline phosphatase elevation    Anemia    Anemia in stage 4 chronic kidney disease (HCC) 10/15/2022   ASD (atrial septal defect)    Cataract    both eyes hx of   Chest pain    03-17-2013 last chest pain   Chronic kidney disease    Stage III kidney disease   Cirrhosis (HCC)    Depression    Diabetes mellitus type II    Family history of adverse reaction to anesthesia    Son hard to wake up   Fatigue    GERD (gastroesophageal reflux disease)    Gout    Headache(784.0)    occasional   Hyperlipidemia    Hypertension    Hypothyroidism    Neuropathy    Osteopenia    Peripheral neuropathy    Presence of pessary    Retinopathy     Past Surgical History:  Procedure Laterality Date   APPENDECTOMY     CATARACT  EXTRACTION     CERVICAL SPINE SURGERY  2006   CHOLECYSTECTOMY N/A 04/07/2013   Procedure: LAPAROSCOPIC CHOLECYSTECTOMY WITH INTRAOPERATIVE CHOLANGIOGRAM;  Surgeon: Robyne Askew, MD;  Location: [facility];  Service: General;  Laterality: N/A;   COMBINED HYSTERECTOMY VAGINAL / OOPHORECTOMY / A&P REPAIR  1987   Unilateral oophorectomy, h/o uterine prolapse has right ovary   ERCP N/A 04/06/2013   Procedure: ENDOSCOPIC  RETROGRADE CHOLANGIOPANCREATOGRAPHY (ERCP);  Surgeon: Theda Belfast, MD;  Location: [facility];  Service: Endoscopy;  Laterality: N/A;   ESOPHAGEAL BANDING N/A 09/26/2021   Procedure: ESOPHAGEAL BANDING;  Surgeon: Jeani Hawking, MD;  Location: [facility];  Service: Gastroenterology;  Laterality: N/A;   ESOPHAGEAL BANDING N/A 10/31/2021   Procedure: ESOPHAGEAL BANDING;  Surgeon: Jeani Hawking, MD;  Location: [facility];  Service: Gastroenterology;  Laterality: N/A;   ESOPHAGOGASTRODUODENOSCOPY (EGD) WITH PROPOFOL N/A 08/16/2020   Procedure: ESOPHAGOGASTRODUODENOSCOPY (EGD) WITH PROPOFOL;  Surgeon: Jeani Hawking, MD;  Location: [facility];  Service: Endoscopy;  Laterality: N/A;   ESOPHAGOGASTRODUODENOSCOPY (EGD) WITH PROPOFOL N/A 09/26/2021   Procedure: ESOPHAGOGASTRODUODENOSCOPY (EGD) WITH PROPOFOL;  Surgeon: Jeani Hawking, MD;  Location: [facility];  Service: Gastroenterology;  Laterality: N/A;   ESOPHAGOGASTRODUODENOSCOPY (EGD) WITH PROPOFOL N/A 10/31/2021   Procedure: ESOPHAGOGASTRODUODENOSCOPY (EGD) WITH PROPOFOL;  Surgeon: Jeani Hawking, MD;  Location: [facility];  Service: Gastroenterology;  Laterality: N/A;   EUS N/A 03/31/2013   Procedure: UPPER ENDOSCOPIC ULTRASOUND (EUS) LINEAR;  Surgeon: Theda Belfast, MD;  Location: [facility];  Service: Endoscopy;  Laterality: N/A;   REFRACTIVE SURGERY     SPINAL FUSION     c4-c7   TONSILLECTOMY  age 67    Family History  Problem Relation Age of Onset   COPD Mother    Lung cancer Father    Diabetes Sister     Cataracts Sister    Insulin resistance Daughter    Insulin resistance Son     Social History   Socioeconomic History   Marital status: Divorced    Spouse name: Not on file   Number of children: 3   Years of education: 12   Highest education level: Not on file  Occupational History   Occupation: [employer]-Retired    Associate Professor: [employer]  Tobacco Use   Smoking status: Never   Smokeless tobacco: Never  Vaping Use   Vaping status: Never Used  Substance and Sexual Activity   Alcohol use: No   Drug use: No   Sexual activity: Not Currently    Birth control/protection: Surgical    Comment: hyst  Other Topics Concern   Not on file  Social History Narrative   Divorced   Lives alone. Reports that her son recently moved out after living with her for a long time. She reports that she is happy to be living alone and feels like she was enabling his behavior. Reports that he had a history of drug use.   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    No pets.   Eats all food groups.    Wears seat belt.    Lives in house.    Smoke detectors.    Social Determinants of Health   Financial Resource Strain: Medium Risk (10/30/2019)   Overall Financial Resource Strain (CARDIA)    Difficulty of Paying Living Expenses: Somewhat hard  Food Insecurity: No Food Insecurity (01/10/2023)   Hunger Vital Sign    Worried About Running Out of Food in the Last Year: Never true    Ran Out of Food in the Last Year: Never true  Transportation Needs: No Transportation Needs (01/10/2023)   PRAPARE - Administrator, Civil Service (Medical): No    Lack of Transportation (Non-Medical): No  Physical Activity: Inactive (10/30/2019)   Exercise Vital Sign    Days of Exercise per Week: 0  days    Minutes of Exercise per Session: 0 min  Stress: No Stress Concern Present (10/30/2019)   [employer] of Occupational Health - Occupational Stress  Questionnaire    Feeling of Stress : Not at all  Social Connections: Moderately Integrated (10/30/2019)   Social Connection and Isolation Panel [NHANES]    Frequency of Communication with Friends and Family: More than three times a week    Frequency of Social Gatherings with Friends and Family: Three times a week    Attends Religious Services: More than 4 times per year    Active Member of Clubs or Organizations: Yes    Attends Banker Meetings: 1 to 4 times per year    Marital Status: Divorced  Intimate Partner Violence: Not At Risk (01/10/2023)   Humiliation, Afraid, Rape, and Kick questionnaire    Fear of Current or Ex-Partner: No    Emotionally Abused: No    Physically Abused: No    Sexually Abused: No     Review of System:   General: Negative for anorexia, weight loss, fever, chills, fatigue,+ weakness. Eyes: Negative for vision changes.  ENT: Negative for hoarseness, difficulty swallowing , nasal congestion. CV: Negative for chest pain, angina, palpitations, dyspnea on exertion, +peripheral edema.  Respiratory: Negative for dyspnea at rest, dyspnea on exertion, cough, sputum, wheezing.  GI: See history of present illness. GU:  anuria MS: Negative for joint pain, low back pain.  Derm: Negative for rash or itching.  Neuro: Negative for weakness, abnormal sensation, seizure, frequent headaches, memory loss, confusion.  Psych: Negative for anxiety, depression, suicidal ideation, hallucinations.  Endo: Negative for unusual weight change.  Heme: Negative for bruising or bleeding. Allergy: Negative for rash or hives.      Physical Examination:   Vital signs in last 24 hours: Temp:  [97.9 F (36.6 C)-98.3 F (36.8 C)] 98.3 F (36.8 C) (11/07 0722) Pulse Rate:  [63-82] 66 (11/07 0945) Resp:  [10-22] 12 (11/07 0945) BP: (92-131)/(31-99) 127/42 (11/07 0945) SpO2:  [93 %-100 %] 100 % (11/07 0945) Last BM Date : 02/03/23  General: chronically ill appearing female  with no acute distress.  Head: Normocephalic, atraumatic.   Eyes: Conjunctiva pale, no icterus. Mouth: Oropharyngeal mucosa moist and pink , no lesions erythema or exudate. Neck: Supple without thyromegaly, masses, or lymphadenopathy.  Lungs: Clear to auscultation bilaterally.  Heart: Regular rate and rhythm, no murmurs rubs or gallops.  Abdomen: Bowel sounds are normal, nontender, nondistended, no hepatosplenomegaly or masses, no abdominal bruits or hernia , no rebound or guarding. Large abdomen but very soft, no tense ascites Rectal: not performed Extremities: trace - 1+ bilateral lower extremity edema, clubbing, deformity.  Neuro: Alert and oriented x 4 , grossly normal neurologically.  Skin: Warm and dry, no rash or jaundice.   Psych: Alert and cooperative, normal mood and affect.        Intake/Output from previous day: 11/06 0701 - 11/07 0700 In: 2689.9 [P.O.:780; I.V.:1438.5; IV Piggyback:471.4] Out: 31 [Urine:30; Stool:1] Intake/Output this shift: Total I/O In: 279.5 [P.O.:200; I.V.:79.5] Out: -   Lab Results:   CBC Recent Labs    02/02/23 1227 02/03/23 0323 02/04/23 0416  WBC 4.6 4.1 5.4  HGB 8.7* 8.3* 7.6*  HCT 26.8* 26.2* 23.7*  MCV 101.5* 101.9* 101.7*  PLT DCLMP 26* 34*   BMET Recent Labs    02/02/23 1227 02/03/23 0323 02/04/23 0416  NA 137 137 137  K 4.0 4.3 4.3  CL 104 104 103  CO2  20* 18* 18*  GLUCOSE 95 110* 128*  BUN 98* 89* 91*  CREATININE 8.55* 8.22* 8.45*  CALCIUM 7.5* 7.2* 7.4*   LFT Recent Labs    02/01/23 1234 02/02/23 1227 02/03/23 0323 02/04/23 0416  BILITOT 0.9 1.1 1.2*  --   ALKPHOS 87 67 64  --   AST 28 29 34  --   ALT 14 18 18   --   PROT 5.3* 5.2* 4.6*  --   ALBUMIN 3.3* 2.9* 2.6* 3.6    Lipase No results for input(s): "LIPASE" in the last 72 hours.  PT/INR Recent Labs    02/01/23 1234 02/04/23 0416  LABPROT 13.4* 20.1*  INR 1.2 1.7*     Hepatitis Panel No results for input(s): "HEPBSAG", "HCVAB", "HEPAIGM",  "HEPBIGM" in the last 72 hours.   Imaging Studies:   DG CHEST PORT 1 VIEW  Result Date: 02/03/2023 CLINICAL DATA:  Central line placement. EXAM: PORTABLE CHEST 1 VIEW COMPARISON:  Chest radiograph dated 09/11/2022. FINDINGS: Right-sided central venous catheter with tip over central SVC. There is mild cardiomegaly and mild vascular congestion. No focal consolidation, pleural effusion or pneumothorax. No acute osseous pathology. Cervical fusion hardware. IMPRESSION: Right-sided central venous catheter with tip over central SVC. No pneumothorax. Electronically Signed   By: Elgie Collard M.D.   On: 02/03/2023 20:35   [place] EKG SITE RITE  Result Date: 02/03/2023 If Site Rite image not attached, placement could not be confirmed due to current cardiac rhythm.  US RENAL  Result Date: 02/03/2023 CLINICAL DATA:  Acute kidney injury EXAM: RENAL / URINARY TRACT ULTRASOUND COMPLETE COMPARISON:  None Available. FINDINGS: Right Kidney: Renal measurements: 9.1 x 4.5 x 4.5 cm = volume: 96.2 mL. Echogenicity within normal limits. No mass or hydronephrosis visualized. Left Kidney: Renal measurements: 8.6 x 4.7 x 4.2 cm = volume: 89.4 mL. Echogenicity within normal limits. No mass or hydronephrosis visualized. Bladder: Appears normal for degree of bladder distention. Other: Cirrhotic liver morphology abdominal ascites. IMPRESSION: No hydronephrosis. Electronically Signed   By: Allegra Lai M.D.   On: 02/03/2023 12:06   CT ABDOMEN PELVIS WO CONTRAST  Result Date: 02/02/2023 CLINICAL DATA:  Dizziness. Abnormal renal function. Abdominal/flank pain. EXAM: CT ABDOMEN AND PELVIS WITHOUT CONTRAST TECHNIQUE: Multidetector CT imaging of the abdomen and pelvis was performed following the standard protocol without IV contrast. RADIATION DOSE REDUCTION: This exam was performed according to the departmental dose-optimization program which includes automated exposure control, adjustment of the mA and/or kV according to patient  size and/or use of iterative reconstruction technique. COMPARISON:  09/04/2022 FINDINGS: Lower chest: Left lower lobe subpleural scarring or atelectasis. Mild cardiomegaly with right coronary artery calcification. Hepatobiliary: Advanced cirrhosis. Cholecystectomy, without biliary ductal dilatation. Pancreas: Fatty replaced pancreas. Spleen: Splenomegaly is mild at 14 cm. Adrenals/Urinary Tract: Normal right adrenal gland. Left adrenal not well visualized secondary to left upper quadrant varices. Bilateral renal cortical thinning. Left renal vascular calcifications. No renal calculi or hydronephrosis. No hydroureter or ureteric calculi. Decompressed urinary bladder, without bladder calculi. A calcification along the left bladder wall is favored to be pericystic and vascular. Stomach/Bowel: The stomach is primarily underdistended. Normal colon and terminal ileum. Normal small bowel. Vascular/Lymphatic: Aortic atherosclerosis. Portal venous hypertension, with left upper quadrant portosystemic collaterals. No abdominopelvic adenopathy. Reproductive: Hysterectomy.  No adnexal mass. Other: Pessary in place. Residual pelvic floor laxity. Moderate volume abdominopelvic ascites is similar. No free intraperitoneal air. Anasarca Musculoskeletal: Osteopenia. Mild superior endplate compression deformity at L4 is unchanged. IMPRESSION: 1. No urinary tract  calculi or obstructive uropathy. Otherwise low sensitivity exam secondary to stone study technique. 2. Cirrhosis and portal venous hypertension with moderate volume abdominopelvic ascites. 3. Aortic Atherosclerosis (ICD10-I70.0). Coronary artery atherosclerosis. Electronically Signed   By: Jeronimo Greaves M.D.   On: 02/02/2023 16:19   US Paracentesis  Result Date: 02/01/2023 INDICATION: Recurrent symptomatic ascites. EXAM: ULTRASOUND-GUIDED PARACENTESIS COMPARISON:  Multiple previous ultrasound-guided paracenteses, most recently on 01/17/2023 yielding 4 L of ascitic fluid  MEDICATIONS: None. COMPLICATIONS: None immediate. TECHNIQUE: Informed written consent was obtained from the patient after a discussion of the risks, benefits and alternatives to treatment. A timeout was performed prior to the initiation of the procedure. Initial ultrasound scanning demonstrates a moderate-to-large amount of ascites within the left lower abdomen which was subsequently prepped and draped in the usual sterile fashion. 1% lidocaine with epinephrine was used for local anesthesia. An ultrasound image was saved for documentation purposed. An 8 Fr Safe-T-Centesis catheter was introduced. The paracentesis was performed. The catheter was removed and a dressing was applied. The patient tolerated the procedure well without immediate post procedural complication. FINDINGS: A total of approximately 4 liters of serous fluid was removed. IMPRESSION: Successful ultrasound-guided paracentesis yielding 4 liters of peritoneal fluid. Electronically Signed   By: Simonne Come M.D.   On: 02/01/2023 15:22   US Paracentesis  Addendum Date: 01/25/2023   ADDENDUM REPORT: 01/25/2023 14:19 ADDENDUM: No samples were sent to the laboratory. The paracentesis was performed in the left lower quadrant. Electronically Signed   By: Marin Roberts M.D.   On: 01/25/2023 14:19   Result Date: 01/25/2023 INDICATION: Ascites.  Liver disease. EXAM: ULTRASOUND GUIDED right lower quadrant PARACENTESIS MEDICATIONS: None COMPLICATIONS: None immediate. PROCEDURE: Informed written consent was obtained from the patient after a discussion of the risks, benefits and alternatives to treatment. A timeout was performed prior to the initiation of the procedure. Initial ultrasound scanning demonstrates a large amount of ascites within the right lower abdominal quadrant. The right lower abdomen was prepped and draped in the usual sterile fashion. 1% lidocaine was used for local anesthesia. Following this, a 19 gauge, 7-cm, Yueh catheter was  introduced. An ultrasound image was saved for documentation purposes. The paracentesis was performed. The catheter was removed and a dressing was applied. The patient tolerated the procedure well without immediate post procedural complication. Patient received post-procedure intravenous albumin; see nursing notes for details. FINDINGS: A total of approximately 4 L of yellow fluid was removed. Samples were sent to the laboratory as requested by the clinical team. IMPRESSION: Successful ultrasound-guided paracentesis yielding 4 liters of peritoneal fluid. Electronically Signed: By: Marin Roberts M.D. On: 01/25/2023 14:12   US Paracentesis  Result Date: 01/18/2023 INDICATION: History of cirrhosis, now with recurrent symptomatic ascites. Please perform ultrasound-guided paracentesis for diagnostic and therapeutic purposes. Maximum volume is 5 L. EXAM: ULTRASOUND-GUIDED PARACENTESIS COMPARISON:  Multiple previous ultrasound-guided paracenteses, most recently on 01/11/2023 yielding 5.5 L of peritoneal fluid. MEDICATIONS: None. COMPLICATIONS: None immediate. TECHNIQUE: Informed written consent was obtained from the patient after a discussion of the risks, benefits and alternatives to treatment. A timeout was performed prior to the initiation of the procedure. Initial ultrasound scanning demonstrates a moderate-to-large amount of ascites within the left lower abdomen which was subsequently prepped and draped in the usual sterile fashion. 1% lidocaine with epinephrine was used for local anesthesia. An ultrasound image was saved for documentation purposed. An 8 Fr Safe-T-Centesis catheter was introduced. The paracentesis was performed. The catheter was removed and a dressing was applied.  The patient tolerated the procedure well without immediate post procedural complication. FINDINGS: A total of approximately 5 liters of serous fluid was removed. Samples were sent to the laboratory as requested by the clinical  team. IMPRESSION: Successful ultrasound-guided paracentesis yielding 5 liters of peritoneal fluid. Electronically Signed   By: Simonne Come M.D.   On: 01/18/2023 17:07   US Paracentesis  Result Date: 01/11/2023 INDICATION: Abdominal ascites. EXAM: ULTRASOUND GUIDED  PARACENTESIS MEDICATIONS: None. COMPLICATIONS: None immediate. PROCEDURE: Informed written consent was obtained from the patient after a discussion of the risks, benefits and alternatives to treatment. A timeout was performed prior to the initiation of the procedure. Initial ultrasound scanning demonstrates a large amount of ascites within the left lower abdominal quadrant. The left lower abdomen was prepped and draped in the usual sterile fashion. 1% lidocaine was used for local anesthesia. Following this, a 19 gauge, 10-cm, Yueh catheter was introduced. Initial attempt with the patient in the left lateral decubitus position just below the umbilicus was unsuccessful. We then prepped more to the left and superior. An ultrasound image was saved for documentation purposes. The paracentesis was performed. The catheter was removed and a dressing was applied. The patient tolerated the procedure well without immediate post procedural complication. Patient received post-procedure intravenous albumin; see nursing notes for details. FINDINGS: A total of approximately 5.5 L of yellow fluid was removed. Samples were sent to the laboratory as requested by the clinical team. IMPRESSION: Successful ultrasound-guided paracentesis yielding 5.5 liters of peritoneal fluid. Electronically Signed   By: Marin Roberts M.D.   On: 01/11/2023 13:33   CT Head Wo Contrast  Result Date: 01/10/2023 CLINICAL DATA:  Delirium EXAM: CT HEAD WITHOUT CONTRAST TECHNIQUE: Contiguous axial images were obtained from the base of the skull through the vertex without intravenous contrast. RADIATION DOSE REDUCTION: This exam was performed according to the departmental  dose-optimization program which includes automated exposure control, adjustment of the mA and/or kV according to patient size and/or use of iterative reconstruction technique. COMPARISON:  CT head 08/25/22 FINDINGS: Brain: No hemorrhage. No hydrocephalus. No extra-axial fluid collection. No CT evidence of an acute cortical infarct. No mass effect. No mass lesion. Vascular: No hyperdense vessel or unexpected calcification. Skull: Normal. Negative for fracture or focal lesion. Sinuses/Orbits: No middle ear or mastoid effusion. Paranasal sinuses are clear. Right lens replacement. Orbits are otherwise unremarkable. Other: None. IMPRESSION: No acute intracranial abnormality. Electronically Signed   By: Lorenza Cambridge M.D.   On: 01/10/2023 16:06  [4 week]  Assessment:   Very pleasant 72 year old female with history of Mash cirrhosis, esophageal varices status post banding, ascites requiring frequent paracenteses, GERD, diabetes, hypertension, hyperlipidemia, CKD, chronic anemia, recent admission for SBP/hepatic encephalopathy presenting to the ED due to significant elevation of her creatinine.  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  Query trigger due to recent SBP.  Recent admission for SBP complicated by hepatic encephalopathy seemingly recovered.  Outpatient labs recently with noted significant bump in creatinine over the last 2 weeks as outlined.  Patient reports couple of days of anuria prior to presentation.  Currently followed by nephrology. On Levophed, octreotide, albumin.  Recurrent ascites requiring weekly paracenteses.  Typically having anywhere from 4 to 5 L removed each time.  Denies any abdominal pain.  Consider cell count with differential with next paracentesis.  Continue 2 g sodium diet.  SBP: Discharged on Cipro 500 mg daily SBP prophylaxis.  Based on clinical course, dosage will likely need to  be adjusted due to renal function.  Would consider  holding beta-blocker due to prior SBP.  With recent hepatic encephalopathy, she has been on lactulose with goal of 2-3 stools daily.  Currently on hold per nursing staff because of significant diarrhea although does not appear to be consistently recorded.  Anemia: multifactorial in setting of IDA, anemia of chronic disease following with hematology. No overt GI bleeding.  Thrombocytopenia: chronic, stable. No evidence of overt bleeding  Elevated INR: consider vitamin K to correct any potential nutritional deficiency as source of coagulopathy.   She is due for surveillance EGD especially in light of recent decompensated event, this is on hold for now until she is clinically stable.  Plans for outpatient.  Plans for hepatoma screening via ultrasound later this month.  More recent imaging via CT has been noncontrast.    Plan:   Continue management of suspected hepatorenal syndrome as per nephrology, levophed, octreotide, albumin Consider cell count with next paracentesis, currently without clinical signs of SBP. On Rocephin as well.  Will need to be discharged on SBP prophylaxis, Cipro dosage will need to be adjusted according to renal function.  Goal of 2-3 soft stools daily, titrate lactulose accordingly.  Recheck CBC, PT/INR, CMET am. Consider vitamin K.     LOS: 2 days   We would like to thank you for the opportunity to participate in the care of Val Eagle.  Leanna Battles. Dixon Boos [facility] [phone] 11/7/202410:45 AM  Discussed with Dr. Tasia Catchings, plan for diagnostic tap to rule out SBP. Patient is on Rocephin, plan for tap tomorrow. Vit K IV 10mg  daily for 3 days.   Leanna Battles. Dixon Boos [facility] [phone] 11/7/20244:52 PM

## 2023-02-04 NOTE — Progress Notes (Signed)
Nephrology Follow-Up Consult note   Assessment/Recommendations: [name] is a/an 72 y.o. female with a past medical history significant for CKD 3B, DM2, GERD, NASH, HLD who presents with severe anuric AKI   Severe AKI on CKD3b: Baseline Crt ~2. 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  She likely has some degree of ATN on top of her HRS physiology. Labs stable today and she says she is making more urine but this has been difficult to collect (prolapsed bladder has made foley difficult as it often won't drain unless she is standing and purewick has been difficult to apply). Fortunately continues to have no uremic symptoms or signs of respiratory compromise  Discussed that there is some hope she could have recovery, particularly if she is making more urine.  We discussed that her long-term dialysis candidacy is poor but we would consider short-term dialysis if needed. -Continue norepi; currently at .  Goal MAP around at least 65 and ideally >70 -Continue albumin -continue oxtreotide -Continue to monitor daily Cr, Dose meds for GFR -Monitor Daily I/Os, Daily weight  -Maintain MAP>65 for optimal renal perfusion.  -Avoid nephrotoxic medications including NSAIDs -Use synthetic opioids (Fentanyl/Dilaudid) if needed -Check Renal U/S to rule out obstruction -Currently no indication for HD but we discussed this is a possibility   Cirrhosis secondary to NASH: Home lactulose.  Consider hepatology involvement   DM2: Management per primary team   Anemia: Likely multifactorial.  Consider transfusion for hemoglobin less than 7.   Thrombocytopenia: Related to liver disease.  Management per primary team   Metabolic acidosis: Fairly mild with bicarb of 18.  Continue home sodium bicarbonate   UTI: Concern for this based on urinalysis.  However, urinalysis contained a lot of squamous epithelial cells.  Management per primary team      Recommendations conveyed to primary service.    Darnell Level [facility] 02/04/2023 10:01 AM  ___________________________________________________________  CC: Abnormal lab  Interval History/Subjective: Discussed the case with the patient and her son. Patient says she feels okay today.  Feels like she is urinating a good bit more but has not been able to get it collected.  Often urinating with bowel movements.  Denies any confusion, nausea, vomiting, dysuria.  No shortness of breath.   Medications:  Current Facility-Administered Medications  Medication Dose Route Frequency Provider Last Rate Last Admin   0.9 %  sodium chloride infusion  250 mL Intravenous Continuous Darnell Level, MD   Stopped at 02/04/23 0747   acetaminophen (TYLENOL) tablet 650 mg  650 mg Oral Q6H PRN Zierle-Ghosh, Asia B, DO       Or   acetaminophen (TYLENOL) suppository 650 mg  650 mg Rectal Q6H PRN Zierle-Ghosh, Asia B, DO       albumin human 25 % solution 25 g  25 g Intravenous Q6H Darnell Level, MD   Stopped at 02/04/23 0631   cefTRIAXone (ROCEPHIN) 1 g in sodium chloride 0.9 % 100 mL IVPB  1 g Intravenous Q24H Zierle-Ghosh, Asia B, DO 200 mL/hr at 02/04/23 1610 Infusion Verify at 02/04/23 9604   Chlorhexidine Gluconate Cloth 2 % PADS 6 each  6 each Topical Daily Zierle-Ghosh, Asia B, DO   6 each at 02/04/23 0917   insulin aspart (novoLOG) injection 0-15 Units  0-15 Units Subcutaneous TID WC Zierle-Ghosh, Asia B, DO   2 Units at 02/04/23 0819   insulin aspart (novoLOG) injection 0-5 Units  0-5 Units Subcutaneous  QHS Zierle-Ghosh, Asia B, DO       insulin detemir (LEVEMIR) injection 8 Units  8 Units Subcutaneous QHS Zierle-Ghosh, Asia B, DO   8 Units at 02/03/23 2036   lactulose (CHRONULAC) 10 GM/15ML solution 10 g  10 g Oral Daily PRN Zierle-Ghosh, Asia B, DO       levothyroxine (SYNTHROID) tablet 125 mcg  125 mcg Oral Q0600 Zierle-Ghosh, Asia B, DO   125 mcg at 02/04/23 5784    liver oil-zinc oxide (DESITIN) 40 % ointment   Topical TID Johnson, Clanford L, MD       norepinephrine (LEVOPHED) 4mg  in (0.016 mg/mL) premix infusion  2-10 mcg/min Intravenous Titrated Darnell Level, MD 18.75 mL/hr at 02/04/23 0822 5 mcg/min at 02/04/23 6962   nystatin (MYCOSTATIN/NYSTOP) topical powder 1 Application  1 Application Topical TID Zierle-Ghosh, Asia B, DO   1 Application at 02/04/23 0820   octreotide (SANDOSTATIN) 500 mcg in sodium chloride 0.9 % 250 mL (2 mcg/mL) infusion  50 mcg/hr Intravenous Continuous Darnell Level, MD 25 mL/hr at 02/04/23 0822 50 mcg/hr at 02/04/23 0822   ondansetron (ZOFRAN) tablet 4 mg  4 mg Oral Q6H PRN Zierle-Ghosh, Asia B, DO       Or   ondansetron (ZOFRAN) injection 4 mg  4 mg Intravenous Q6H PRN Zierle-Ghosh, Asia B, DO       oxyCODONE (Oxy IR/ROXICODONE) immediate release tablet 5 mg  5 mg Oral Q4H PRN Zierle-Ghosh, Asia B, DO   5 mg at 02/02/23 2202   sodium bicarbonate tablet 1,300 mg  1,300 mg Oral BID Zierle-Ghosh, Asia B, DO   1,300 mg at 02/04/23 9528      Review of Systems: 10 systems reviewed and negative except per interval history/subjective  Physical Exam: Vitals:   02/04/23 0930 02/04/23 0945  BP: (!) 126/45 (!) 127/42  Pulse: 70 66  Resp: 15 12  Temp:    SpO2: 100% 100%   Total I/O In: 279.5 [P.O.:200; I.V.:79.5] Out: -   Intake/Output Summary (Last 24 hours) at 02/04/2023 1001 Last data filed at 02/04/2023 4132 Gross per 24 hour  Intake 2969.42 ml  Output 31 ml  Net 2938.42 ml   Constitutional: well-appearing, no acute distress ENMT: ears and nose without scars or lesions, MMM CV: normal rate, 1+ edema at the bilateral shins Respiratory: clear to auscultation, normal work of breathing Gastrointestinal: soft, non-tender, minimally distended Skin: no visible lesions or rashes Psych: alert, judgement/insight appropriate, appropriate mood and affect   Test Results I personally reviewed new and old  clinical labs and radiology tests Lab Results  Component Value Date   NA 137 02/04/2023   K 4.3 02/04/2023   CL 103 02/04/2023   CO2 18 (L) 02/04/2023   BUN 91 (H) 02/04/2023   CREATININE 8.45 (H) 02/04/2023   CALCIUM 7.4 (L) 02/04/2023   ALBUMIN 3.6 02/04/2023   PHOS 9.3 (H) 02/04/2023    CBC Recent Labs  Lab 02/01/23 1234 02/02/23 1227 02/03/23 0323 02/04/23 0416  WBC 4.9 4.6 4.1 5.4  NEUTROABS 3.3 3.1 2.9  --   HGB 8.7* 8.7* 8.3* 7.6*  HCT 26.2* 26.8* 26.2* 23.7*  MCV 101* 101.5* 101.9* 101.7*  PLT 43* DCLMP 26* 34*

## 2023-02-04 NOTE — Progress Notes (Signed)
PROGRESS NOTE   [name]  QIH:[id number] DOB: 01-04-1951 DOA: 02/02/2023 PCP: Benita Stabile, MD   Chief Complaint  Patient presents with   Abnormal Lab   Level of care: ICU  Brief Admission History:   72 y.o. female with medical history significant of anemia, CKD, is mellitus type II, depression, GERD, hyperlipidemia, hypertension, hypothyroidism, retinopathy, and more presents the ED with a chief complaint of abnormal labs.  Patient reports she woke up feeling like her normal self, and everybody was telling her that she needed to go to the ER.  Apparently patient had recently been admitted for hepatic encephalopathy.  She was discharged on October 22.  She had a follow-up appointment with GI where they drew routine labs.  They called her and told her that she needed to come into the ER due to her BUN and creatinine.  Patient reports she is in no pain at the time of my exam.  She reports she has been having diarrhea but that is normal for her.  I would expect her to have diarrhea because she is on lactulose.  She denies any dizziness, chest pain, palpitations, shortness of breath.  Patient reports she is not sure why she is here.  On chart review patient recently discharged after a 9-day stay in the hospital.  She was found to have hepatic encephalopathy at that time.  She had no history of hepatic encephalopathy.  Her ammonia was as high as 130.  She also appeared to be dehydrated.  Her symptoms improved with a lactulose enema.  She completed IV ceftriaxone x 5 days for spontaneous bacterial peritonitis.  Her ammonia was down to 47 at discharge.  She did have a paracentesis on October 14 with 5.5 L of cloudy ascitic fluid removed.  Patient was sent home on lactulose to be titrated to achieve 2-3 mushy stools per day per discharge summary.  Patient had an acute kidney injury at that admission as well.  Her creatinine peaked at 2.77 at that time.  She was discharged on ciprofloxacin from that  admission.  Patient has no other complaints at this time.   Patient does not smoke and does not drink.  Patient reports she would want to be full code.  Son at bedside reports that she had a DNR in the past, but revoked it.  Patient is full code.     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 Appreciate Dr. Valentino Nose recommendation and GI consultation  - Continue home bicarb - central line placed on 11/6  - Avoid nephrotoxic agents when possible - follow renal function panel   Hypotension - Blood pressure as low as 85/36 at presentation - Family reports that blood pressure does run low at home - After 3 L of fluid her systolic is still in the low 100s - Monitor on stepdown ICU - Hold Inderal and torsemide - Nephrology started on IV albumin and IV norepinephrine infusion for goal MAP>65  and central line placed 11/6   UTI (urinary tract infection) - UA indicative of UTI - Urine culture pending - Appears that she was recently on Cipro outpatient - Rocephin started in the ED - Continue Rocephin x 3 doses  - CT abdomen pelvis did not show any obstructive uropathy or calculi present - could be a  contaminated specimen given squamous cells  Anemia due to chronic kidney disease - Hemoglobin stable at 8.7 - Likely related to chronic disease - Trend in the a.m. - No active bleeding reported  Thrombocytopenia - severe  - History of thrombocytopenia - Platelet count down to 26 - Hold pharmaceutical DVT prophylaxis - Continue to monitor - due to chronic liver disease   Liver cirrhosis secondary to NASH - Reducing lactulose to as needed for mild constipation given patient's hypovolemic status and AKI - CT did redemonstrate cirrhosis and portal venous hypertension as well as ascites - Continue to monitor  Type  2 diabetes mellitus with diabetic chronic kidney disease  - Sliding scale coverage (renal sensitive) - Monitoring CBG CBG (last 3)  Recent Labs    02/04/23 0721 02/04/23 1135 02/04/23 1529  GLUCAP 135* 109* 181*   Acquired hypothyroidism - Continue Synthroid  DVT prophylaxis: SCDs Code Status: Full  Family Communication: son at bedside 11/6  Disposition: TBD    Consultants:  Nephrology  Procedures:   Antimicrobials:    Subjective: Pt having small amounts of urine output.    Objective: Vitals:   02/04/23 1515 02/04/23 1530 02/04/23 1545 02/04/23 1600  BP: (!) 128/46 (!) 128/47 (!) 132/44 (!) 147/42  Pulse: (!) 58 65 64 63  Resp: 16 20 19 19   Temp:    98.1 F (36.7 C)  TempSrc:    Oral  SpO2: 98% 99% 97% 98%    Intake/Output Summary (Last 24 hours) at 02/04/2023 1623 Last data filed at 02/04/2023 1128 Gross per 24 hour  Intake 2025.89 ml  Output 1 ml  Net 2024.89 ml   There were no vitals filed for this visit. Examination:  General exam: chronically ill appearing female, awake, Appears calm and comfortable  Respiratory system: Clear to auscultation. Respiratory effort normal. Cardiovascular system: normal S1 & S2 heard. No JVD, murmurs, rubs, gallops or clicks. No pedal edema. Gastrointestinal system: Abdomen is mildly distended, soft and nontender. No fluid wave. No masses felt. Normal bowel sounds heard. Central nervous system: Alert and oriented. No focal neurological deficits. Extremities: Symmetric 5 x 5 power. Skin: No rashes, lesions or ulcers. Psychiatry: Judgement and insight appear normal. Mood & affect appropriate.   Data Reviewed: I have personally reviewed following labs and imaging studies  CBC: Recent Labs  Lab 02/01/23 1234 02/02/23 1227 02/03/23 0323 02/04/23 0416  WBC 4.9 4.6 4.1 5.4  NEUTROABS 3.3 3.1 2.9  --   HGB 8.7* 8.7* 8.3* 7.6*  HCT 26.2* 26.8* 26.2* 23.7*  MCV 101* 101.5* 101.9* 101.7*  PLT 43* DCLMP 26* 34*    Basic  Metabolic Panel: Recent Labs  Lab 02/01/23 1234 02/02/23 1227 02/03/23 0323 02/04/23 0416  NA 140 137 137 137  K 4.4 4.0 4.3 4.3  CL 100 104 104 103  CO2 17* 20* 18* 18*  GLUCOSE 203* 95 110* 128*  BUN 86* 98* 89* 91*  CREATININE 7.26* 8.55* 8.22* 8.45*  CALCIUM 7.6* 7.5* 7.2* 7.4*  MG  --   --  2.1 2.1  PHOS  --   --   --  9.3*    CBG: Recent Labs  Lab 02/03/23 1609 02/03/23 2002 02/04/23 0721 02/04/23 1135 02/04/23 1529  GLUCAP 119* 149* 135* 109* 181*    Recent Results (from the past 240 hour(s))  Urine Culture     Status: None   Collection Time: 02/02/23  2:55 PM   Specimen: Urine, Clean Catch  Result Value Ref Range Status  Specimen Description   Final    URINE, CLEAN CATCH Performed at [facility], [address], [place], [place] [address]    Special Requests   Final    NONE Performed at [facility], [address], [place], [place] [address]    Culture   Final    NO GROWTH Performed at [facility], [address] [place], [place] [address]    Report Status 02/03/2023 FINAL  Final  MRSA Next Gen by PCR, Nasal     Status: None   Collection Time: 02/02/23  8:08 PM   Specimen: Nasal Mucosa; Nasal Swab  Result Value Ref Range Status   MRSA by PCR Next Gen NOT DETECTED NOT DETECTED Final    Comment: (NOTE) The GeneXpert MRSA Assay (FDA approved for NASAL specimens only), is one component of a comprehensive MRSA colonization surveillance program. It is not intended to diagnose MRSA infection nor to guide or monitor treatment for MRSA infections. Test performance is not FDA approved in patients less than 41 years old. Performed at [facility], [address], [place], [place] [address]      Radiology Studies: DG CHEST PORT 1 VIEW  Result Date: 02/03/2023 CLINICAL DATA:  Central line placement. EXAM: PORTABLE CHEST 1 VIEW COMPARISON:  Chest radiograph dated 09/11/2022. FINDINGS: Right-sided central venous catheter with tip over central SVC.  There is mild cardiomegaly and mild vascular congestion. No focal consolidation, pleural effusion or pneumothorax. No acute osseous pathology. Cervical fusion hardware. IMPRESSION: Right-sided central venous catheter with tip over central SVC. No pneumothorax. Electronically Signed   By: Elgie Collard M.D.   On: 02/03/2023 20:35   [place] EKG SITE RITE  Result Date: 02/03/2023 If Site Rite image not attached, placement could not be confirmed due to current cardiac rhythm.  US RENAL  Result Date: 02/03/2023 CLINICAL DATA:  Acute kidney injury EXAM: RENAL / URINARY TRACT ULTRASOUND COMPLETE COMPARISON:  None Available. FINDINGS: Right Kidney: Renal measurements: 9.1 x 4.5 x 4.5 cm = volume: 96.2 mL. Echogenicity within normal limits. No mass or hydronephrosis visualized. Left Kidney: Renal measurements: 8.6 x 4.7 x 4.2 cm = volume: 89.4 mL. Echogenicity within normal limits. No mass or hydronephrosis visualized. Bladder: Appears normal for degree of bladder distention. Other: Cirrhotic liver morphology abdominal ascites. IMPRESSION: No hydronephrosis. Electronically Signed   By: Allegra Lai M.D.   On: 02/03/2023 12:06    Scheduled Meds:  Chlorhexidine Gluconate Cloth  6 each Topical Daily   insulin aspart  0-15 Units Subcutaneous TID WC   insulin aspart  0-5 Units Subcutaneous QHS   insulin detemir  8 Units Subcutaneous QHS   levothyroxine  125 mcg Oral Q0600   lidocaine  1 patch Transdermal Q24H   liver oil-zinc oxide   Topical TID   nystatin  1 Application Topical TID   sevelamer carbonate  800 mg Oral TID WC   sodium bicarbonate  1,300 mg Oral BID   Continuous Infusions:  albumin human 25 g (02/04/23 1014)   cefTRIAXone (ROCEPHIN)  IV 200 mL/hr at 02/04/23 2956   norepinephrine (LEVOPHED) Adult infusion 5 mcg/min (02/04/23 0822)   octreotide (SANDOSTATIN) 500 mcg in sodium chloride 0.9 % 250 mL (2 mcg/mL) infusion 50 mcg/hr (02/04/23 1128)     LOS: 2 days   Critical Care Procedure  Note Authorized and Performed by: Maryln Manuel MD  Total Critical Care time:  55 mins Due to a high probability of clinically significant, life threatening deterioration, the patient required  my highest level of preparedness to intervene emergently and I personally spent this critical care time directly and personally managing the patient.  This critical care time included obtaining a history; examining the patient, pulse oximetry; ordering and review of studies; arranging urgent treatment with development of a management plan; evaluation of patient's response of treatment; frequent reassessment; and discussions with other providers.  This critical care time was performed to assess and manage the high probability of imminent and life threatening deterioration that could result in multi-organ failure.  It was exclusive of separately billable procedures and treating other patients and teaching time.    Standley Dakins, MD How to contact the [facility] Attending or Consulting provider 7A - 7P or covering provider during after hours 7P -7A, for this patient?  Check the care team in [facility] and look for a) attending/consulting [facility] provider listed and b) the [facility] team listed Log into [email or web address] to find provider on call.  Locate the [facility] provider you are looking for under [facility] and page to a number that you can be directly reached. If you still have difficulty reaching the provider, please page the [facility] (Director on Call) for the Hospitalists listed on amion for assistance.  02/04/2023, 4:23 PM

## 2023-02-05 ENCOUNTER — Inpatient Hospital Stay (HOSPITAL_COMMUNITY): Payer: Medicare Other

## 2023-02-05 DIAGNOSIS — N390 Urinary tract infection, site not specified: Secondary | ICD-10-CM | POA: Diagnosis not present

## 2023-02-05 DIAGNOSIS — K7460 Unspecified cirrhosis of liver: Secondary | ICD-10-CM | POA: Diagnosis not present

## 2023-02-05 DIAGNOSIS — E039 Hypothyroidism, unspecified: Secondary | ICD-10-CM

## 2023-02-05 DIAGNOSIS — D696 Thrombocytopenia, unspecified: Secondary | ICD-10-CM

## 2023-02-05 DIAGNOSIS — N189 Chronic kidney disease, unspecified: Secondary | ICD-10-CM

## 2023-02-05 DIAGNOSIS — D509 Iron deficiency anemia, unspecified: Secondary | ICD-10-CM

## 2023-02-05 DIAGNOSIS — N179 Acute kidney failure, unspecified: Secondary | ICD-10-CM | POA: Diagnosis not present

## 2023-02-05 DIAGNOSIS — K7581 Nonalcoholic steatohepatitis (NASH): Secondary | ICD-10-CM | POA: Diagnosis not present

## 2023-02-05 LAB — HEMOGLOBIN AND HEMATOCRIT, BLOOD
HCT: 25.2 % — ABNORMAL LOW (ref 36.0–46.0)
Hemoglobin: 8.4 g/dL — ABNORMAL LOW (ref 12.0–15.0)

## 2023-02-05 LAB — COMPREHENSIVE METABOLIC PANEL
ALT: 13 U/L (ref 0–44)
AST: 17 U/L (ref 15–41)
Albumin: 3.6 g/dL (ref 3.5–5.0)
Alkaline Phosphatase: 49 U/L (ref 38–126)
Anion gap: 14 (ref 5–15)
BUN: 86 mg/dL — ABNORMAL HIGH (ref 8–23)
CO2: 17 mmol/L — ABNORMAL LOW (ref 22–32)
Calcium: 7 mg/dL — ABNORMAL LOW (ref 8.9–10.3)
Chloride: 105 mmol/L (ref 98–111)
Creatinine, Ser: 8.14 mg/dL — ABNORMAL HIGH (ref 0.44–1.00)
GFR, Estimated: 5 mL/min — ABNORMAL LOW (ref >60)
Glucose, Bld: 158 mg/dL — ABNORMAL HIGH (ref 70–99)
Potassium: 4 mmol/L (ref 3.5–5.1)
Sodium: 136 mmol/L (ref 135–145)
Total Bilirubin: 1.2 mg/dL — ABNORMAL HIGH (ref <1.2)
Total Protein: 5 g/dL — ABNORMAL LOW (ref 6.5–8.1)

## 2023-02-05 LAB — CBC WITH DIFFERENTIAL/PLATELET
Abs Immature Granulocytes: 0.02 10*3/uL (ref 0.00–0.07)
Basophils Absolute: 0 10*3/uL (ref 0.0–0.1)
Basophils Relative: 0 %
Eosinophils Absolute: 0.4 10*3/uL (ref 0.0–0.5)
Eosinophils Relative: 8 %
HCT: 22.1 % — ABNORMAL LOW (ref 36.0–46.0)
Hemoglobin: 7.3 g/dL — ABNORMAL LOW (ref 12.0–15.0)
Immature Granulocytes: 0 %
Lymphocytes Relative: 12 %
Lymphs Abs: 0.6 10*3/uL — ABNORMAL LOW (ref 0.7–4.0)
MCH: 33.2 pg (ref 26.0–34.0)
MCHC: 33 g/dL (ref 30.0–36.0)
MCV: 100.5 fL — ABNORMAL HIGH (ref 80.0–100.0)
Monocytes Absolute: 0.5 10*3/uL (ref 0.1–1.0)
Monocytes Relative: 9 %
Neutro Abs: 4 10*3/uL (ref 1.7–7.7)
Neutrophils Relative %: 71 %
Platelets: 27 10*3/uL — CL (ref 150–400)
RBC: 2.2 MIL/uL — ABNORMAL LOW (ref 3.87–5.11)
RDW: 15.9 % — ABNORMAL HIGH (ref 11.5–15.5)
WBC: 5.5 10*3/uL (ref 4.0–10.5)
nRBC: 0 % (ref 0.0–0.2)

## 2023-02-05 LAB — BODY FLUID CELL COUNT WITH DIFFERENTIAL
Eos, Fluid: 1 %
Lymphs, Fluid: 45 %
Monocyte-Macrophage-Serous Fluid: 22 % — ABNORMAL LOW (ref 50–90)
Neutrophil Count, Fluid: 32 % — ABNORMAL HIGH (ref 0–25)
Total Nucleated Cell Count, Fluid: 92 uL (ref 0–1000)

## 2023-02-05 LAB — GRAM STAIN: Gram Stain: NONE SEEN

## 2023-02-05 LAB — GLUCOSE, CAPILLARY
Glucose-Capillary: 119 mg/dL — ABNORMAL HIGH (ref 70–99)
Glucose-Capillary: 148 mg/dL — ABNORMAL HIGH (ref 70–99)
Glucose-Capillary: 141 mg/dL — ABNORMAL HIGH (ref 70–99)
Glucose-Capillary: 153 mg/dL — ABNORMAL HIGH (ref 70–99)

## 2023-02-05 LAB — PHOSPHORUS: Phosphorus: 8.6 mg/dL — ABNORMAL HIGH (ref 2.5–4.6)

## 2023-02-05 LAB — PATHOLOGIST SMEAR REVIEW

## 2023-02-05 LAB — PROTIME-INR
INR: 1.9 — ABNORMAL HIGH (ref 0.8–1.2)
Prothrombin Time: 21.9 s — ABNORMAL HIGH (ref 11.4–15.2)

## 2023-02-05 LAB — ABO/RH: ABO/RH(D): A POS

## 2023-02-05 LAB — FOLATE: Folate: 5.6 ng/mL — ABNORMAL LOW (ref >5.9)

## 2023-02-05 LAB — PREPARE RBC (CROSSMATCH)

## 2023-02-05 MED ORDER — OXYCODONE HCL 5 MG PO TABS
2.5000 mg | ORAL_TABLET | ORAL | Status: DC | PRN
  Administered 2023-02-06 – 2023-02-14 (×14): 2.5 mg via ORAL
  Filled 2023-02-05 (×14): qty 1

## 2023-02-05 MED ORDER — ALBUMIN HUMAN 25 % IV SOLN
50.0000 g | Freq: Every day | INTRAVENOUS | Status: DC
  Administered 2023-02-05 – 2023-02-09 (×5): 50 g via INTRAVENOUS
  Filled 2023-02-05 (×5): qty 200

## 2023-02-05 MED ORDER — SODIUM CHLORIDE 0.9% IV SOLUTION: Freq: Once | INTRAVENOUS | Status: AC

## 2023-02-05 MED ORDER — FOLIC ACID 1 MG PO TABS
1.0000 mg | ORAL_TABLET | Freq: Every day | ORAL | Status: DC
  Administered 2023-02-05 – 2023-02-11 (×7): 1 mg via ORAL
  Filled 2023-02-05 (×7): qty 1

## 2023-02-05 MED ORDER — LACTULOSE 10 GM/15ML PO SOLN
10.0000 g | Freq: Two times a day (BID) | ORAL | Status: DC
  Administered 2023-02-05 – 2023-02-12 (×8): 10 g via ORAL
  Filled 2023-02-05 (×3): qty 30
  Filled 2023-02-05 (×5): qty 15
  Filled 2023-02-05 (×2): qty 30
  Filled 2023-02-05 (×2): qty 15
  Filled 2023-02-05: qty 30
  Filled 2023-02-05 (×3): qty 15

## 2023-02-05 MED ORDER — INSULIN DETEMIR 100 UNIT/ML ~~LOC~~ SOLN
6.0000 [IU] | Freq: Every day | SUBCUTANEOUS | Status: DC
  Administered 2023-02-05 – 2023-02-10 (×6): 6 [IU] via SUBCUTANEOUS
  Filled 2023-02-05 (×7): qty 0.06

## 2023-02-05 NOTE — Progress Notes (Signed)
Nephrology Follow-Up note   Assessment/Recommendations: [name] is a/an 72 y.o. female with a past medical history significant for CKD 3B, DM2, GERD, NASH, HLD who presents with severe anuric AKI   Severe AKI on CKD3b: Baseline Crt ~2. 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  She likely has some degree of ATN on top of her HRS physiology. Labs stable but not improving.  Renal US without hydro and with normal echogenicity.  Note also prolapsed bladder has made foley difficult as it often won't drain unless she is standing and purewick has been difficult to apply. Fortunately continues to have no uremic symptoms or signs of respiratory compromise however not making much urine.   - We discussed that her long-term dialysis candidacy is poor but we would consider short-term dialysis if needed.  There is currently no emergent indication for dialysis but we discussed that I am concerned that she would progress to the need for dialysis if this is in line with her goals of care.  Her son is meeting with her later today so that she and her son can discuss goals of care.  We all spoke (her son joined via speakerphone) and he is worried about her chance of a good outcome from all of this.  Note that GI's note indicates possible referral to a transplant center   -Continue norepi.  Goal MAP ideally >70 for optimal renal perfusion. (I have spoken with nurse and checked order) - Continue albumin 50 grams daily while on therapy for hepatorenal syndrome  - Giving PRBC's x 1 unit  -octreotide per GI discretion - Obtain post void residual bladder scan (may have confounding factors but don't want to miss obstruction) and in/out cath if over 300 mL urine retained.  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  GI is consulted   DM2: Management per primary team   Macrocytic Anemia: Likely  multifactorial.  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 Note platelets 39 in 12/2022.  Management per primary team   Metabolic acidosis:  Continue home sodium bicarbonate   UTI: Concern for this based on urinalysis.  However, urinalysis contained a lot of squamous epithelial cells.  Management per primary team   Disposition - please reach out to nephrology over the weekend if issues or concerns.  We will review her chart and attempt to see her over the weekend as able  ___________________________________________  CC: Abnormal lab  Interval History/Subjective:  She had one unmeasured urine void over 11/7.  Per charting difficult to capture urine output and in the past had been urinating with bowel movements.  Her levo had been reduced to 4 mcg/min - RN increased on my exam.  I met the patient this AM and we called her son on speakerphone.  He is concerned about her long-term prognosis and states has read her progress notes - he is coming to see her this afternoon and they are going to discuss what her goals of care are related to dialysis.  I have told them no emergent need though this seems imminent.  She states that she normally is at home and watches TV and she uses her rollator to go to and from the bathroom.  She states that she has been dealing with liver disease for "several years".   She and I discussed risks/benefits/indications for blood transfusion  and she does consent to blood transfusion.   Review of systems:  Denies shortness of breath or chest pain  No urine output in the last day or two per patient    Medications:  Current Facility-Administered Medications  Medication Dose Route Frequency Provider Last Rate Last Admin   acetaminophen (TYLENOL) tablet 650 mg  650 mg Oral Q6H PRN Zierle-Ghosh, Asia B, DO   650 mg at 02/04/23 1021   Or   acetaminophen (TYLENOL) suppository 650 mg  650 mg Rectal Q6H PRN Zierle-Ghosh, Asia B, DO        Chlorhexidine Gluconate Cloth 2 % PADS 6 each  6 each Topical Daily Zierle-Ghosh, Asia B, DO   6 each at 02/05/23 0623   insulin aspart (novoLOG) injection 0-15 Units  0-15 Units Subcutaneous TID WC Zierle-Ghosh, Asia B, DO   2 Units at 02/05/23 1105   insulin detemir (LEVEMIR) injection 6 Units  6 Units Subcutaneous QHS Johnson, Clanford L, MD       lactulose (CHRONULAC) 10 GM/15ML solution 10 g  10 g Oral Daily PRN Zierle-Ghosh, Asia B, DO       levothyroxine (SYNTHROID) tablet 125 mcg  125 mcg Oral Q0600 Zierle-Ghosh, Asia B, DO   125 mcg at 02/05/23 0602   lidocaine (LIDODERM) 5 % 1 patch  1 patch Transdermal Q24H Johnson, Clanford L, MD   1 patch at 02/04/23 1246   liver oil-zinc oxide (DESITIN) 40 % ointment   Topical TID Cleora Fleet, MD   Given at 02/05/23 1011   norepinephrine (LEVOPHED) 4mg  in (0.016 mg/mL) premix infusion  2-10 mcg/min Intravenous Titrated Darnell Level, MD 15 mL/hr at 02/05/23 0856 4 mcg/min at 02/05/23 0856   nystatin (MYCOSTATIN/NYSTOP) topical powder 1 Application  1 Application Topical TID Zierle-Ghosh, Asia B, DO   1 Application at 02/05/23 1011   octreotide (SANDOSTATIN) 500 mcg in sodium chloride 0.9 % 250 mL (2 mcg/mL) infusion  50 mcg/hr Intravenous Continuous Darnell Level, MD 25 mL/hr at 02/05/23 0856 50 mcg/hr at 02/05/23 0856   ondansetron (ZOFRAN) tablet 4 mg  4 mg Oral Q6H PRN Zierle-Ghosh, Asia B, DO       Or   ondansetron (ZOFRAN) injection 4 mg  4 mg Intravenous Q6H PRN Zierle-Ghosh, Asia B, DO       oxyCODONE (Oxy IR/ROXICODONE) immediate release tablet 2.5 mg  2.5 mg Oral Q4H PRN Johnson, Clanford L, MD       phytonadione (VITAMIN K) 10 mg in dextrose 5 % 50 mL IVPB  10 mg Intravenous Daily Tiffany Kocher, PA-C 51 mL/hr at 02/05/23 1010 10 mg at 02/05/23 1010   sevelamer carbonate (RENVELA) tablet 800 mg  800 mg Oral TID WC Darnell Level, MD   800 mg at 02/05/23 1011   sodium bicarbonate tablet 1,300 mg  1,300 mg Oral BID  Zierle-Ghosh, Asia B, DO   1,300 mg at 02/05/23 1011      Physical Exam: Vitals:   02/05/23 1053 02/05/23 1100  BP:  (!) 127/40  Pulse:  66  Resp:  16  Temp: (!) 97.5 F (36.4 C)   SpO2:  98%   Total I/O In: 213.8 [P.O.:60; I.V.:153.8] Out: -   Intake/Output Summary (Last 24 hours) at 02/05/2023 1149 Last data filed at 02/05/2023 0859 Gross per 24 hour  Intake 1560.59 ml  Output --  Net 1560.59 ml   General adult female in bed in no acute distress HEENT normocephalic atraumatic extraocular movements  intact sclera anicteric Neck supple trachea midline Lungs clear to auscultation bilaterally normal work of breathing at rest; on 2.5 liters oxygen Heart S1S2 no rub Abdomen soft nontender nondistended Extremities 2+ edema  Psych normal mood and affect Neuro awake and alert; conversant; follows commands Gu no foley in place   Test Results I personally reviewed new and old clinical labs and radiology tests Lab Results  Component Value Date   NA 136 02/05/2023   K 4.0 02/05/2023   CL 105 02/05/2023   CO2 17 (L) 02/05/2023   BUN 86 (H) 02/05/2023   CREATININE 8.14 (H) 02/05/2023   CALCIUM 7.0 (L) 02/05/2023   ALBUMIN 3.6 02/05/2023   PHOS 8.6 (H) 02/05/2023    CBC Recent Labs  Lab 02/02/23 1227 02/03/23 0323 02/04/23 0416 02/05/23 0554  WBC 4.6 4.1 5.4 5.5  NEUTROABS 3.1 2.9  --  4.0  HGB 8.7* 8.3* 7.6* 7.3*  HCT 26.8* 26.2* 23.7* 22.1*  MCV 101.5* 101.9* 101.7* 100.5*  PLT DCLMP 26* 34* 27*    Estanislado Emms, MD 12:38 PM 02/05/2023

## 2023-02-05 NOTE — Progress Notes (Signed)
PROGRESS NOTE   [name]  UEA:[id number] DOB: 11/10/1950 DOA: 02/02/2023 PCP: Benita Stabile, MD   Chief Complaint  Patient presents with   Abnormal Lab   Level of care: ICU  Brief Admission History:   72 y.o. female with medical history significant of anemia, CKD, is mellitus type II, depression, GERD, hyperlipidemia, hypertension, hypothyroidism, retinopathy, and more presents the ED with a chief complaint of abnormal labs.  Patient reports she woke up feeling like her normal self, and everybody was telling her that she needed to go to the ER.  Apparently patient had recently been admitted for hepatic encephalopathy.  She was discharged on October 22.  She had a follow-up appointment with GI where they drew routine labs.  They called her and told her that she needed to come into the ER due to her BUN and creatinine.  Patient reports she is in no pain at the time of my exam.  She reports she has been having diarrhea but that is normal for her.  I would expect her to have diarrhea because she is on lactulose.  She denies any dizziness, chest pain, palpitations, shortness of breath.  Patient reports she is not sure why she is here.  On chart review patient recently discharged after a 9-day stay in the hospital.  She was found to have hepatic encephalopathy at that time.  She had no history of hepatic encephalopathy.  Her ammonia was as high as 130.  She also appeared to be dehydrated.  Her symptoms improved with a lactulose enema.  She completed IV ceftriaxone x 5 days for spontaneous bacterial peritonitis.  Her ammonia was down to 47 at discharge.  She did have a paracentesis on October 14 with 5.5 L of cloudy ascitic fluid removed.  Patient was sent home on lactulose to be titrated to achieve 2-3 mushy stools per day per discharge summary.  Patient had an acute kidney injury at that admission as well.  Her creatinine peaked at 2.77 at that time.  She was discharged on ciprofloxacin from that  admission.  Patient has no other complaints at this time.   Patient does not smoke and does not drink.  Patient reports she would want to be full code.  Son at bedside reports that she had a DNR in the past, but revoked it.  Patient is full code.     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 Appreciate Dr. Valentino Nose recommendation and GI consultation  - Continue home bicarb - central line placed on 11/6  - Avoid nephrotoxic agents when possible - follow renal function panel   Intake/Output Summary (Last 24 hours) at 02/05/2023 1012 Last data filed at 02/05/2023 0859 Gross per 24 hour  Intake 1780.59 ml  Output --  Net 1780.59 ml    Hypotension - Blood pressure as low as 85/36 at presentation - Family reports that blood pressure does run low at home - After 3 L of fluid her systolic is still in the low 100s - Monitor on stepdown ICU - Hold Inderal and torsemide - Nephrology started on IV albumin and IV norepinephrine infusion for goal MAP>65  and central line placed 11/6   UTI (urinary tract infection) - UA indicative of UTI - Urine culture pending - Appears that she was recently on  Cipro outpatient - Rocephin started in the ED - Continue Rocephin x 3 doses completed - CT abdomen pelvis did not show any obstructive uropathy or calculi present - could be a contaminated specimen given squamous cells  Anemia due to chronic kidney disease - Hemoglobin stable at 8.7 - Likely related to chronic disease - Trend in the a.m. - No active bleeding reported  Thrombocytopenia - severe  - History of thrombocytopenia - Platelet count down to 26 - Hold pharmaceutical DVT prophylaxis - Continue to monitor - due to chronic liver disease   Liver cirrhosis secondary to NASH - Reducing lactulose to as needed for  mild constipation given patient's hypovolemic status and AKI - CT did redemonstrate cirrhosis and portal venous hypertension as well as ascites - Continue to monitor  Type 2 diabetes mellitus with diabetic chronic kidney disease  - Sliding scale coverage (renal sensitive) - Monitoring CBG CBG (last 3)  Recent Labs    02/04/23 1529 02/04/23 2122 02/05/23 0728  GLUCAP 181* 163* 119*   Acquired hypothyroidism - Continue Synthroid  DVT prophylaxis: SCDs Code Status: Full  Family Communication: son at bedside 11/6  Disposition: TBD    Consultants:  Nephrology  Procedures:   Antimicrobials:    Subjective: Pt reports no recent urination unfortunately, she says she is sleepy from pain medication given early this morning   Objective: Vitals:   02/05/23 0726 02/05/23 0800 02/05/23 0815 02/05/23 0900  BP:  (!) 124/37 (!) 129/40 (!) 109/49  Pulse:  62 66 71  Resp:  16 16 19   Temp: 98.2 F (36.8 C)     TempSrc: Oral     SpO2:  99% 99% 99%  Weight:      Height:        Intake/Output Summary (Last 24 hours) at 02/05/2023 1010 Last data filed at 02/05/2023 0859 Gross per 24 hour  Intake 1780.59 ml  Output --  Net 1780.59 ml   Filed Weights   02/05/23 0600  Weight: 96.9 kg   Examination:  General exam: chronically ill appearing female, awake, Appears calm and comfortable  Respiratory system: Clear to auscultation. Respiratory effort normal. Cardiovascular system: normal S1 & S2 heard. No JVD, murmurs, rubs, gallops or clicks. No pedal edema. Gastrointestinal system: Abdomen is mildly distended, soft and nontender. No fluid wave. No masses felt. Normal bowel sounds heard. Central nervous system: Alert and oriented. No focal neurological deficits. Extremities: Symmetric 5 x 5 power. Skin: No rashes, lesions or ulcers. Psychiatry: Judgement and insight appear normal. Mood & affect appropriate.   Data Reviewed: I have personally reviewed following labs and imaging  studies  CBC: Recent Labs  Lab 02/01/23 1234 02/02/23 1227 02/03/23 0323 02/04/23 0416 02/05/23 0554  WBC 4.9 4.6 4.1 5.4 5.5  NEUTROABS 3.3 3.1 2.9  --  4.0  HGB 8.7* 8.7* 8.3* 7.6* 7.3*  HCT 26.2* 26.8* 26.2* 23.7* 22.1*  MCV 101* 101.5* 101.9* 101.7* 100.5*  PLT 43* DCLMP 26* 34* 27*    Basic Metabolic Panel: Recent Labs  Lab 02/01/23 1234 02/02/23 1227 02/03/23 0323 02/04/23 0416 02/05/23 0554  NA 140 137 137 137 136  K 4.4 4.0 4.3 4.3 4.0  CL 100 104 104 103 105  CO2 17* 20* 18* 18* 17*  GLUCOSE 203* 95 110* 128* 158*  BUN 86* 98* 89* 91* 86*  CREATININE 7.26* 8.55* 8.22* 8.45* 8.14*  CALCIUM 7.6* 7.5* 7.2* 7.4* 7.0*  MG  --   --  2.1 2.1  --  PHOS  --   --   --  9.3* 8.6*    CBG: Recent Labs  Lab 02/04/23 0721 02/04/23 1135 02/04/23 1529 02/04/23 2122 02/05/23 0728  GLUCAP 135* 109* 181* 163* 119*    Recent Results (from the past 240 hour(s))  Urine Culture     Status: None   Collection Time: 02/02/23  2:55 PM   Specimen: Urine, Clean Catch  Result Value Ref Range Status   Specimen Description   Final    URINE, CLEAN CATCH Performed at [facility], [address] [place], [place] [address]    Special Requests   Final    NONE Performed at [facility], [address], [place], [place] [address]    Culture   Final    NO GROWTH Performed at [facility], [address] [place], [place] [address]    Report Status 02/03/2023 FINAL  Final  MRSA Next Gen by PCR, Nasal     Status: None   Collection Time: 02/02/23  8:08 PM   Specimen: Nasal Mucosa; Nasal Swab  Result Value Ref Range Status   MRSA by PCR Next Gen NOT DETECTED NOT DETECTED Final    Comment: (NOTE) The GeneXpert MRSA Assay (FDA approved for NASAL specimens only), is one component of a comprehensive MRSA colonization surveillance program. It is not intended to diagnose MRSA infection nor to guide or monitor treatment for MRSA infections. Test performance is not FDA  approved in patients less than 79 years old. Performed at [facility], [address], [place], [place] [address]      Radiology Studies: DG CHEST PORT 1 VIEW  Result Date: 02/03/2023 CLINICAL DATA:  Central line placement. EXAM: PORTABLE CHEST 1 VIEW COMPARISON:  Chest radiograph dated 09/11/2022. FINDINGS: Right-sided central venous catheter with tip over central SVC. There is mild cardiomegaly and mild vascular congestion. No focal consolidation, pleural effusion or pneumothorax. No acute osseous pathology. Cervical fusion hardware. IMPRESSION: Right-sided central venous catheter with tip over central SVC. No pneumothorax. Electronically Signed   By: Elgie Collard M.D.   On: 02/03/2023 20:35   [place] EKG SITE RITE  Result Date: 02/03/2023 If Site Rite image not attached, placement could not be confirmed due to current cardiac rhythm.  US RENAL  Result Date: 02/03/2023 CLINICAL DATA:  Acute kidney injury EXAM: RENAL / URINARY TRACT ULTRASOUND COMPLETE COMPARISON:  None Available. FINDINGS: Right Kidney: Renal measurements: 9.1 x 4.5 x 4.5 cm = volume: 96.2 mL. Echogenicity within normal limits. No mass or hydronephrosis visualized. Left Kidney: Renal measurements: 8.6 x 4.7 x 4.2 cm = volume: 89.4 mL. Echogenicity within normal limits. No mass or hydronephrosis visualized. Bladder: Appears normal for degree of bladder distention. Other: Cirrhotic liver morphology abdominal ascites. IMPRESSION: No hydronephrosis. Electronically Signed   By: Allegra Lai M.D.   On: 02/03/2023 12:06    Scheduled Meds:  Chlorhexidine Gluconate Cloth  6 each Topical Daily   insulin aspart  0-15 Units Subcutaneous TID WC   insulin aspart  0-5 Units Subcutaneous QHS   insulin detemir  8 Units Subcutaneous QHS   levothyroxine  125 mcg Oral Q0600   lidocaine  1 patch Transdermal Q24H   liver oil-zinc oxide   Topical TID   nystatin  1 Application Topical TID   sevelamer carbonate  800 mg Oral TID WC   sodium  bicarbonate  1,300 mg Oral BID   Continuous Infusions:  norepinephrine (LEVOPHED) Adult infusion 4 mcg/min (02/05/23 0856)   octreotide (  SANDOSTATIN) 500 mcg in sodium chloride 0.9 % 250 mL (2 mcg/mL) infusion 50 mcg/hr (02/05/23 0856)   phytonadione (VITAMIN K) 10 mg in dextrose 5 % 50 mL IVPB Stopped (02/04/23 2356)     LOS: 3 days   Critical Care Procedure Note Authorized and Performed by: Maryln Manuel MD  Total Critical Care time:  52 mins Due to a high probability of clinically significant, life threatening deterioration, the patient required my highest level of preparedness to intervene emergently and I personally spent this critical care time directly and personally managing the patient.  This critical care time included obtaining a history; examining the patient, pulse oximetry; ordering and review of studies; arranging urgent treatment with development of a management plan; evaluation of patient's response of treatment; frequent reassessment; and discussions with other providers.  This critical care time was performed to assess and manage the high probability of imminent and life threatening deterioration that could result in multi-organ failure.  It was exclusive of separately billable procedures and treating other patients and teaching time.    Standley Dakins, MD How to contact the [facility] Attending or Consulting provider 7A - 7P or covering provider during after hours 7P -7A, for this patient?  Check the care team in [facility] and look for a) attending/consulting [facility] provider listed and b) the [facility] team listed Log into [email or web address] to find provider on call.  Locate the [facility] provider you are looking for under [facility] and page to a number that you can be directly reached. If you still have difficulty reaching the provider, please page the [facility] (Director on Call) for the Hospitalists listed on amion for assistance.  02/05/2023, 10:10 AM

## 2023-02-05 NOTE — Progress Notes (Signed)
Pt transported from ICU to [place] suite, procedure explained, consent obtained. Prepped and draped in sterile fashion. Access obtained at L abdomen, samples obtained, sentto lab. Bandage secured to site, no bleeding or hematoma. Escorted back to ICU in no acute distress.

## 2023-02-05 NOTE — Procedures (Addendum)
PROCEDURE SUMMARY:  Successful image-guided paracentesis from the left lateral abdomen.  Yielded 100 milliliters of clear yellow fluid (diagnostic only requested).  No immediate complications.  EBL: zero Patient tolerated well.   Specimen was sent for labs.  Please see imaging section of Epic for full dictation.  Villa Herb PA-C 02/05/2023 11:55 AM

## 2023-02-05 NOTE — Progress Notes (Addendum)
Gastroenterology Progress Note   Referring Provider: No ref. provider found Primary Care Physician:  Benita Stabile, MD Primary Gastroenterologist: Dolores Frame, MD  Patient ID: Val Eagle; [id number]; 08/01/1950    Subjective   Feeling okay today. No BM today, states she thinks lactulose has been prn and not scheduled.   Objective   Vital signs in last 24 hours Temp:  [98 F (36.7 C)-98.3 F (36.8 C)] 98.2 F (36.8 C) (11/08 0726) Pulse Rate:  [58-74] 66 (11/08 1000) Resp:  [12-26] 23 (11/08 1000) BP: (107-147)/(33-115) 116/33 (11/08 1000) SpO2:  [96 %-100 %] 99 % (11/08 1000) Weight:  [96.9 kg] 96.9 kg (11/08 0600) Last BM Date : 02/04/23  Physical Exam General:   Alert and oriented, pleasant Head:  Normocephalic and atraumatic. Eyes:  No icterus, sclera clear. Conjuctiva pink.  Neck:  Supple, without thyromegaly or masses.  Lungs: Clear to auscultation bilaterally, without wheezing, rales, or rhonchi.  Abdomen:  Bowel sounds present, soft, distended, non-tender. No masses appreciated  Msk:  Symmetrical without gross deformities. Normal posture. Extremities:  + 2 edema bilaterally.  Neurologic:  Alert and  oriented x4;  grossly normal neurologically. No asterixis.  Skin:  Warm and dry, intact without significant lesions.  Psych:  Alert and cooperative. Normal mood and affect.  Intake/Output from previous day: 11/07 0701 - 11/08 0700 In: 1846.3 [P.O.:660; I.V.:914.7; IV Piggyback:271.5] Out: -  Intake/Output this shift: Total I/O In: 213.8 [P.O.:60; I.V.:153.8] Out: -   Lab Results  Recent Labs    02/03/23 0323 02/04/23 0416 02/05/23 0554  WBC 4.1 5.4 5.5  HGB 8.3* 7.6* 7.3*  HCT 26.2* 23.7* 22.1*  PLT 26* 34* 27*   BMET Recent Labs    02/03/23 0323 02/04/23 0416 02/05/23 0554  NA 137 137 136  K 4.3 4.3 4.0  CL 104 103 105  CO2 18* 18* 17*  GLUCOSE 110* 128* 158*  BUN 89* 91* 86*  CREATININE 8.22* 8.45* 8.14*  CALCIUM 7.2* 7.4*  7.0*   LFT Recent Labs    02/03/23 0323 02/04/23 0416 02/05/23 0554  PROT 4.6* 5.3* 5.0*  ALBUMIN 2.6* 3.7  3.6 3.6  AST 34 22 17  ALT 18 16 13   ALKPHOS 64 59 49  BILITOT 1.2* 1.3* 1.2*  BILIDIR  --  0.4*  --   IBILI  --  0.9  --    PT/INR Recent Labs    02/04/23 0416 02/05/23 0554  LABPROT 20.1* 21.9*  INR 1.7* 1.9*   Hepatitis Panel No results for input(s): "HEPBSAG", "HCVAB", "HEPAIGM", "HEPBIGM" in the last 72 hours.  Studies/Results DG CHEST PORT 1 VIEW  Result Date: 02/03/2023 CLINICAL DATA:  Central line placement. EXAM: PORTABLE CHEST 1 VIEW COMPARISON:  Chest radiograph dated 09/11/2022. FINDINGS: Right-sided central venous catheter with tip over central SVC. There is mild cardiomegaly and mild vascular congestion. No focal consolidation, pleural effusion or pneumothorax. No acute osseous pathology. Cervical fusion hardware. IMPRESSION: Right-sided central venous catheter with tip over central SVC. No pneumothorax. Electronically Signed   By: Elgie Collard M.D.   On: 02/03/2023 20:35   [place] EKG SITE RITE  Result Date: 02/03/2023 If Site Rite image not attached, placement could not be confirmed due to current cardiac rhythm.  US RENAL  Result Date: 02/03/2023 CLINICAL DATA:  Acute kidney injury EXAM: RENAL / URINARY TRACT ULTRASOUND COMPLETE COMPARISON:  None Available. FINDINGS: Right Kidney: Renal measurements: 9.1 x 4.5 x 4.5 cm = volume: 96.2 mL. Echogenicity within normal  limits. No mass or hydronephrosis visualized. Left Kidney: Renal measurements: 8.6 x 4.7 x 4.2 cm = volume: 89.4 mL. Echogenicity within normal limits. No mass or hydronephrosis visualized. Bladder: Appears normal for degree of bladder distention. Other: Cirrhotic liver morphology abdominal ascites. IMPRESSION: No hydronephrosis. Electronically Signed   By: Allegra Lai M.D.   On: 02/03/2023 12:06   CT ABDOMEN PELVIS WO CONTRAST  Result Date: 02/02/2023 CLINICAL DATA:  Dizziness.  Abnormal renal function. Abdominal/flank pain. EXAM: CT ABDOMEN AND PELVIS WITHOUT CONTRAST TECHNIQUE: Multidetector CT imaging of the abdomen and pelvis was performed following the standard protocol without IV contrast. RADIATION DOSE REDUCTION: This exam was performed according to the departmental dose-optimization program which includes automated exposure control, adjustment of the mA and/or kV according to patient size and/or use of iterative reconstruction technique. COMPARISON:  09/04/2022 FINDINGS: Lower chest: Left lower lobe subpleural scarring or atelectasis. Mild cardiomegaly with right coronary artery calcification. Hepatobiliary: Advanced cirrhosis. Cholecystectomy, without biliary ductal dilatation. Pancreas: Fatty replaced pancreas. Spleen: Splenomegaly is mild at 14 cm. Adrenals/Urinary Tract: Normal right adrenal gland. Left adrenal not well visualized secondary to left upper quadrant varices. Bilateral renal cortical thinning. Left renal vascular calcifications. No renal calculi or hydronephrosis. No hydroureter or ureteric calculi. Decompressed urinary bladder, without bladder calculi. A calcification along the left bladder wall is favored to be pericystic and vascular. Stomach/Bowel: The stomach is primarily underdistended. Normal colon and terminal ileum. Normal small bowel. Vascular/Lymphatic: Aortic atherosclerosis. Portal venous hypertension, with left upper quadrant portosystemic collaterals. No abdominopelvic adenopathy. Reproductive: Hysterectomy.  No adnexal mass. Other: Pessary in place. Residual pelvic floor laxity. Moderate volume abdominopelvic ascites is similar. No free intraperitoneal air. Anasarca Musculoskeletal: Osteopenia. Mild superior endplate compression deformity at L4 is unchanged. IMPRESSION: 1. No urinary tract calculi or obstructive uropathy. Otherwise low sensitivity exam secondary to stone study technique. 2. Cirrhosis and portal venous hypertension with moderate  volume abdominopelvic ascites. 3. Aortic Atherosclerosis (ICD10-I70.0). Coronary artery atherosclerosis. Electronically Signed   By: Jeronimo Greaves M.D.   On: 02/02/2023 16:19   US Paracentesis  Result Date: 02/01/2023 INDICATION: Recurrent symptomatic ascites. EXAM: ULTRASOUND-GUIDED PARACENTESIS COMPARISON:  Multiple previous ultrasound-guided paracenteses, most recently on 01/17/2023 yielding 4 L of ascitic fluid MEDICATIONS: None. COMPLICATIONS: None immediate. TECHNIQUE: Informed written consent was obtained from the patient after a discussion of the risks, benefits and alternatives to treatment. A timeout was performed prior to the initiation of the procedure. Initial ultrasound scanning demonstrates a moderate-to-large amount of ascites within the left lower abdomen which was subsequently prepped and draped in the usual sterile fashion. 1% lidocaine with epinephrine was used for local anesthesia. An ultrasound image was saved for documentation purposed. An 8 Fr Safe-T-Centesis catheter was introduced. The paracentesis was performed. The catheter was removed and a dressing was applied. The patient tolerated the procedure well without immediate post procedural complication. FINDINGS: A total of approximately 4 liters of serous fluid was removed. IMPRESSION: Successful ultrasound-guided paracentesis yielding 4 liters of peritoneal fluid. Electronically Signed   By: Simonne Come M.D.   On: 02/01/2023 15:22   US Paracentesis  Addendum Date: 01/25/2023   ADDENDUM REPORT: 01/25/2023 14:19 ADDENDUM: No samples were sent to the laboratory. The paracentesis was performed in the left lower quadrant. Electronically Signed   By: Marin Roberts M.D.   On: 01/25/2023 14:19   Result Date: 01/25/2023 INDICATION: Ascites.  Liver disease. EXAM: ULTRASOUND GUIDED right lower quadrant PARACENTESIS MEDICATIONS: None COMPLICATIONS: None immediate. PROCEDURE: Informed written consent was obtained from  the patient  after a discussion of the risks, benefits and alternatives to treatment. A timeout was performed prior to the initiation of the procedure. Initial ultrasound scanning demonstrates a large amount of ascites within the right lower abdominal quadrant. The right lower abdomen was prepped and draped in the usual sterile fashion. 1% lidocaine was used for local anesthesia. Following this, a 19 gauge, 7-cm, Yueh catheter was introduced. An ultrasound image was saved for documentation purposes. The paracentesis was performed. The catheter was removed and a dressing was applied. The patient tolerated the procedure well without immediate post procedural complication. Patient received post-procedure intravenous albumin; see nursing notes for details. FINDINGS: A total of approximately 4 L of yellow fluid was removed. Samples were sent to the laboratory as requested by the clinical team. IMPRESSION: Successful ultrasound-guided paracentesis yielding 4 liters of peritoneal fluid. Electronically Signed: By: Marin Roberts M.D. On: 01/25/2023 14:12   US Paracentesis  Result Date: 01/18/2023 INDICATION: History of cirrhosis, now with recurrent symptomatic ascites. Please perform ultrasound-guided paracentesis for diagnostic and therapeutic purposes. Maximum volume is 5 L. EXAM: ULTRASOUND-GUIDED PARACENTESIS COMPARISON:  Multiple previous ultrasound-guided paracenteses, most recently on 01/11/2023 yielding 5.5 L of peritoneal fluid. MEDICATIONS: None. COMPLICATIONS: None immediate. TECHNIQUE: Informed written consent was obtained from the patient after a discussion of the risks, benefits and alternatives to treatment. A timeout was performed prior to the initiation of the procedure. Initial ultrasound scanning demonstrates a moderate-to-large amount of ascites within the left lower abdomen which was subsequently prepped and draped in the usual sterile fashion. 1% lidocaine with epinephrine was used for local anesthesia.  An ultrasound image was saved for documentation purposed. An 8 Fr Safe-T-Centesis catheter was introduced. The paracentesis was performed. The catheter was removed and a dressing was applied. The patient tolerated the procedure well without immediate post procedural complication. FINDINGS: A total of approximately 5 liters of serous fluid was removed. Samples were sent to the laboratory as requested by the clinical team. IMPRESSION: Successful ultrasound-guided paracentesis yielding 5 liters of peritoneal fluid. Electronically Signed   By: Simonne Come M.D.   On: 01/18/2023 17:07   US Paracentesis  Result Date: 01/11/2023 INDICATION: Abdominal ascites. EXAM: ULTRASOUND GUIDED  PARACENTESIS MEDICATIONS: None. COMPLICATIONS: None immediate. PROCEDURE: Informed written consent was obtained from the patient after a discussion of the risks, benefits and alternatives to treatment. A timeout was performed prior to the initiation of the procedure. Initial ultrasound scanning demonstrates a large amount of ascites within the left lower abdominal quadrant. The left lower abdomen was prepped and draped in the usual sterile fashion. 1% lidocaine was used for local anesthesia. Following this, a 19 gauge, 10-cm, Yueh catheter was introduced. Initial attempt with the patient in the left lateral decubitus position just below the umbilicus was unsuccessful. We then prepped more to the left and superior. An ultrasound image was saved for documentation purposes. The paracentesis was performed. The catheter was removed and a dressing was applied. The patient tolerated the procedure well without immediate post procedural complication. Patient received post-procedure intravenous albumin; see nursing notes for details. FINDINGS: A total of approximately 5.5 L of yellow fluid was removed. Samples were sent to the laboratory as requested by the clinical team. IMPRESSION: Successful ultrasound-guided paracentesis yielding 5.5 liters of  peritoneal fluid. Electronically Signed   By: Marin Roberts M.D.   On: 01/11/2023 13:33   CT Head Wo Contrast  Result Date: 01/10/2023 CLINICAL DATA:  Delirium EXAM: CT HEAD WITHOUT CONTRAST TECHNIQUE: Contiguous axial  images were obtained from the base of the skull through the vertex without intravenous contrast. RADIATION DOSE REDUCTION: This exam was performed according to the departmental dose-optimization program which includes automated exposure control, adjustment of the mA and/or kV according to patient size and/or use of iterative reconstruction technique. COMPARISON:  CT head 08/25/22 FINDINGS: Brain: No hemorrhage. No hydrocephalus. No extra-axial fluid collection. No CT evidence of an acute cortical infarct. No mass effect. No mass lesion. Vascular: No hyperdense vessel or unexpected calcification. Skull: Normal. Negative for fracture or focal lesion. Sinuses/Orbits: No middle ear or mastoid effusion. Paranasal sinuses are clear. Right lens replacement. Orbits are otherwise unremarkable. Other: None. IMPRESSION: No acute intracranial abnormality. Electronically Signed   By: Lorenza Cambridge M.D.   On: 01/10/2023 16:06    Assessment  72 y.o. female with a history of with history of [name] cirrhosis, nonbleeding esophageal varices status post banding, SBP, ascites requiring repeated paracenteses, diabetes, hypertension, CKD, chronic anemia recently seen in the GI office on October 31 for follow-up of hospitalization for SBP and hepatic encephalopathy. Previously followed with Dr. Loreta Ave but according to the patient she is not available in the short-term therefore she has opted to follow with Dr. Levon Hedger.   Decompensated MASH Cirrhosis: As outlined further below, she is experiencing symptoms consistent with hepatorenal syndrome, possibly triggered by recent SBP.  Also with some recent hepatic encephalopathy which appears to be improved.  Encouraged to come to the ED as recommended by Dr. Hortencia Pilar  given outpatient labs and creatinine. MELD score significant elevated given elevated Creatinine.  Course is uncomplicated by SBP as noted below and recurrent ascites requiring weekly paracenteses, averaging 4-5 L each visit.  Has reportedly been following a 2 g sodium diet.  Chronic thrombocytopenia but stable.  INR remains elevated, could consider Vitamin K.  Given history of EGD, recommend continuing her lactulose and titrate for 2-3 soft bowel movements daily, will make scheduled instead of prn.   Due for hepatoma screening outpatient later this month and also in need of EGD for EV surveillance which is scheduled for 11/26.  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  On norepinephrine, octreotide, and received initial guideline recommended dose of albumin which nephrology has continued daily albumin.  Creatinine elevated to 7.26 on outpatient labs and on presentation to the hospital it was elevated 8.55.  Despite IV hydration she has not had any significant improvement in her creatinine, it is slightly improved today to 8.14 from 8.45.  Diagnostic paracentesis today with normal cell count, negative for SBP.  If no improvement in renal function and decline in hemodynamics then she may need transfer to tertiary center/transplant facility for CRRT if she desires that within her goals of care.   Discussed with patient and family at bedside potential need for dialysis and time will tell if this is more permanent kidney failure vs acute. She will have this discussion with her family including her son who is currently at bedside.   SBP: Recent admission for SBP and treated with IV ceftriaxone, transitioned to Cipro outpatient for prophylaxis. NSBB held with active SBP, Dr. Levon Hedger recommended restarting outpatient. NSBB currently on hold to allow for adequate kidney perfusion. No evidence of SBP based on total cell count with diagnostic para today.   Anemia: Multifactorial in the setting  of chronic disease and iron deficiency. No overt GI bleeding. Folate low at 5.6, recommend daily replacement.    Plan / Recommendations  Management of HRS per nephrology.  Continue cipro  at d/c for SBP prophylaxis Titrate lactulose for 2-3 BM daily Consider Vitamin K Folic acid supplementation Daily CBC, CMP, INR Outpatient hepatoma screening due later this month Outpatient EGD as already scheduled.  Daily folic acid supplementation    LOS: 3 days   02/05/2023, 10:52 AM  Brooke Bonito, MSN, FNP-BC, AGACNP-BC [facility]

## 2023-02-05 NOTE — Progress Notes (Signed)
CRITICAL VALUE STICKER  CRITICAL VALUE:  Platelets 27  RECEIVER (on-site recipient of call): Gertie Exon RN  DATE & TIME NOTIFIED: 02/05/23 @ 0713  MD NOTIFIED: Standley Dakins, MD  TIME OF NOTIFICATION: 02/05/23 @ 3151  RESPONSE:  No new orders

## 2023-02-06 DIAGNOSIS — N179 Acute kidney failure, unspecified: Secondary | ICD-10-CM | POA: Diagnosis not present

## 2023-02-06 DIAGNOSIS — E1122 Type 2 diabetes mellitus with diabetic chronic kidney disease: Secondary | ICD-10-CM | POA: Diagnosis not present

## 2023-02-06 DIAGNOSIS — R7989 Other specified abnormal findings of blood chemistry: Secondary | ICD-10-CM

## 2023-02-06 DIAGNOSIS — K7581 Nonalcoholic steatohepatitis (NASH): Secondary | ICD-10-CM | POA: Diagnosis not present

## 2023-02-06 DIAGNOSIS — E861 Hypovolemia: Secondary | ICD-10-CM

## 2023-02-06 DIAGNOSIS — D696 Thrombocytopenia, unspecified: Secondary | ICD-10-CM | POA: Diagnosis not present

## 2023-02-06 DIAGNOSIS — K767 Hepatorenal syndrome: Secondary | ICD-10-CM | POA: Diagnosis not present

## 2023-02-06 DIAGNOSIS — N189 Chronic kidney disease, unspecified: Secondary | ICD-10-CM | POA: Diagnosis not present

## 2023-02-06 LAB — PHOSPHORUS: Phosphorus: 8.9 mg/dL — ABNORMAL HIGH (ref 2.5–4.6)

## 2023-02-06 LAB — BPAM RBC
Blood Product Expiration Date: 202411252359
ISSUE DATE / TIME: 202411081510
Unit Type and Rh: 6200

## 2023-02-06 LAB — COMPREHENSIVE METABOLIC PANEL WITH GFR
ALT: 11 U/L (ref 0–44)
AST: 13 U/L — ABNORMAL LOW (ref 15–41)
Albumin: 3.7 g/dL (ref 3.5–5.0)
Alkaline Phosphatase: 46 U/L (ref 38–126)
Anion gap: 13 (ref 5–15)
BUN: 90 mg/dL — ABNORMAL HIGH (ref 8–23)
CO2: 18 mmol/L — ABNORMAL LOW (ref 22–32)
Calcium: 7.2 mg/dL — ABNORMAL LOW (ref 8.9–10.3)
Chloride: 103 mmol/L (ref 98–111)
Creatinine, Ser: 8.39 mg/dL — ABNORMAL HIGH (ref 0.44–1.00)
GFR, Estimated: 5 mL/min — ABNORMAL LOW
Glucose, Bld: 175 mg/dL — ABNORMAL HIGH (ref 70–99)
Potassium: 3.9 mmol/L (ref 3.5–5.1)
Sodium: 134 mmol/L — ABNORMAL LOW (ref 135–145)
Total Bilirubin: 1.9 mg/dL — ABNORMAL HIGH
Total Protein: 5.1 g/dL — ABNORMAL LOW (ref 6.5–8.1)

## 2023-02-06 LAB — GLUCOSE, CAPILLARY
Glucose-Capillary: 105 mg/dL — ABNORMAL HIGH (ref 70–99)
Glucose-Capillary: 104 mg/dL — ABNORMAL HIGH (ref 70–99)
Glucose-Capillary: 128 mg/dL — ABNORMAL HIGH (ref 70–99)
Glucose-Capillary: 139 mg/dL — ABNORMAL HIGH (ref 70–99)

## 2023-02-06 LAB — TYPE AND SCREEN
ABO/RH(D): A POS
Antibody Screen: NEGATIVE
Unit division: 0

## 2023-02-06 LAB — PROTIME-INR
INR: 2 — ABNORMAL HIGH (ref 0.8–1.2)
Prothrombin Time: 23 s — ABNORMAL HIGH (ref 11.4–15.2)

## 2023-02-06 LAB — CBC
HCT: 23.7 % — ABNORMAL LOW (ref 36.0–46.0)
Hemoglobin: 7.7 g/dL — ABNORMAL LOW (ref 12.0–15.0)
MCH: 32.4 pg (ref 26.0–34.0)
MCHC: 32.5 g/dL (ref 30.0–36.0)
MCV: 99.6 fL (ref 80.0–100.0)
Platelets: 21 10*3/uL — CL (ref 150–400)
RBC: 2.38 MIL/uL — ABNORMAL LOW (ref 3.87–5.11)
RDW: 16.1 % — ABNORMAL HIGH (ref 11.5–15.5)
WBC: 5.5 10*3/uL (ref 4.0–10.5)
nRBC: 0 % (ref 0.0–0.2)

## 2023-02-06 MED ORDER — CIPROFLOXACIN HCL 250 MG PO TABS
250.0000 mg | ORAL_TABLET | ORAL | Status: DC
  Administered 2023-02-06: 250 mg via ORAL
  Filled 2023-02-06: qty 1

## 2023-02-06 NOTE — Plan of Care (Signed)

## 2023-02-06 NOTE — Progress Notes (Signed)
PROGRESS NOTE   [name]  XBM:[id number] DOB: 04-Apr-1950 DOA: 02/02/2023 PCP: Benita Stabile, MD   Chief Complaint  Patient presents with   Abnormal Lab   Level of care: ICU  Brief Admission History:   72 y.o. female with medical history significant of anemia, CKD, is mellitus type II, depression, GERD, hyperlipidemia, hypertension, hypothyroidism, retinopathy, and more presents the ED with a chief complaint of abnormal labs.  Patient reports she woke up feeling like her normal self, and everybody was telling her that she needed to go to the ER.  Apparently patient had recently been admitted for hepatic encephalopathy.  She was discharged on October 22.  She had a follow-up appointment with GI where they drew routine labs.  They called her and told her that she needed to come into the ER due to her BUN and creatinine.  Patient reports she is in no pain at the time of my exam.  She reports she has been having diarrhea but that is normal for her.  I would expect her to have diarrhea because she is on lactulose.  She denies any dizziness, chest pain, palpitations, shortness of breath.  Patient reports she is not sure why she is here.  On chart review patient recently discharged after a 9-day stay in the hospital.  She was found to have hepatic encephalopathy at that time.  She had no history of hepatic encephalopathy.  Her ammonia was as high as 130.  She also appeared to be dehydrated.  Her symptoms improved with a lactulose enema.  She completed IV ceftriaxone x 5 days for spontaneous bacterial peritonitis.  Her ammonia was down to 47 at discharge.  She did have a paracentesis on October 14 with 5.5 L of cloudy ascitic fluid removed.  Patient was sent home on lactulose to be titrated to achieve 2-3 mushy stools per day per discharge summary.  Patient had an acute kidney injury at that admission as well.  Her creatinine peaked at 2.77 at that time.  She was discharged on ciprofloxacin from that  admission.  Patient has no other complaints at this time.   Patient does not smoke and does not drink.  Patient reports she would want to be full code.  Son at bedside reports that she had a DNR in the past, but revoked it.  Patient is full code.     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 Appreciate Dr. Valentino Nose recommendation and GI consultation  - Continue home bicarb - central line placed on 11/6  - Avoid nephrotoxic agents when possible - follow renal function panel   Intake/Output Summary (Last 24 hours) at 02/06/2023 1215 Last data filed at 02/06/2023 0431 Gross per 24 hour  Intake 1371.45 ml  Output --  Net 1371.45 ml   Hypotension - Blood pressure as low as 85/36 at presentation - Family reports that blood pressure does run low at home - After 3 L of fluid her systolic is still in the low 100s - Monitor on stepdown ICU - Hold Inderal and torsemide - Nephrology started on IV albumin and IV norepinephrine infusion for goal MAP>70  and central line placed 11/6   UTI (urinary tract infection) - Ruled Out   - UA suspicious for UTI - Urine culture no growth  -  Rocephin started in the ED - Continue Rocephin x 3 doses completed - CT abdomen pelvis did not show any obstructive uropathy or calculi present - could be a contaminated specimen given squamous cells  Anemia due to chronic kidney disease - Likely related to chronic disease and acute renal failure  - Trend - No active bleeding reported  Thrombocytopenia - severe  - History of thrombocytopenia - Platelet count down to 26 - Hold pharmaceutical DVT prophylaxis - Continue to monitor - due to chronic liver disease   Liver cirrhosis secondary to NASH - Reducing lactulose to as needed for mild constipation given patient's hypovolemic  status and AKI - CT did redemonstrate cirrhosis and portal venous hypertension as well as ascites - Dr Levon Hedger recommended restarting cipro for SBP prophylaxis renal dosed, spoke with pharm D and orders started 11/9  Type 2 diabetes mellitus with diabetic chronic kidney disease  - Sliding scale coverage (renal sensitive) - Monitoring CBG CBG (last 3)  Recent Labs    02/05/23 2153 02/06/23 0751 02/06/23 1151  GLUCAP 153* 105* 104*   Acquired hypothyroidism - Continue Synthroid  DVT prophylaxis: SCDs Code Status: Full  Family Communication: son at bedside 11/6  Disposition: TBD    Consultants:  Nephrology  Procedures:   Antimicrobials:    Subjective: Pt urinating a bit more today.   Objective: Vitals:   02/06/23 0700 02/06/23 0715 02/06/23 0817 02/06/23 0930  BP: (!) 116/91 (!) 129/59  (!) 126/50  Pulse: 67 70  76  Resp: 20 (!) 21  20  Temp:   98.4 F (36.9 C)   TempSrc:   Oral   SpO2: 96% 93%  91%  Weight:      Height:        Intake/Output Summary (Last 24 hours) at 02/06/2023 1215 Last data filed at 02/06/2023 0431 Gross per 24 hour  Intake 1371.45 ml  Output --  Net 1371.45 ml   Filed Weights   02/05/23 0600  Weight: 96.9 kg   Examination:  General exam: chronically ill appearing female, awake, Appears calm and comfortable  Respiratory system: Clear to auscultation. Respiratory effort normal. Cardiovascular system: normal S1 & S2 heard. No JVD, murmurs, rubs, gallops or clicks. No pedal edema. Gastrointestinal system: Abdomen is mildly distended, soft and nontender. No fluid wave. No masses felt. Normal bowel sounds heard. Central nervous system: Alert and oriented. No focal neurological deficits. Extremities: Symmetric 5 x 5 power. Skin: No rashes, lesions or ulcers. Psychiatry: Judgement and insight appear normal. Mood & affect appropriate.   Data Reviewed: I have personally reviewed following labs and imaging studies  CBC: Recent Labs  Lab  02/01/23 1234 02/02/23 1227 02/02/23 1227 02/03/23 0323 02/04/23 0416 02/05/23 0554 02/05/23 2015 02/06/23 0510  WBC 4.9 4.6  --  4.1 5.4 5.5  --  5.5  NEUTROABS 3.3 3.1  --  2.9  --  4.0  --   --   HGB 8.7* 8.7*   < > 8.3* 7.6* 7.3* 8.4* 7.7*  HCT 26.2* 26.8*   < > 26.2* 23.7* 22.1* 25.2* 23.7*  MCV 101* 101.5*  --  101.9* 101.7* 100.5*  --  99.6  PLT 43* DCLMP  --  26* 34* 27*  --  21*   < > = values in this interval not displayed.    Basic Metabolic Panel: Recent Labs  Lab 02/02/23 1227 02/03/23 0323 02/04/23 0416 02/05/23 0554 02/06/23 0510  NA 137 137 137 136 134*  K 4.0 4.3 4.3 4.0  3.9  CL 104 104 103 105 103  CO2 20* 18* 18* 17* 18*  GLUCOSE 95 110* 128* 158* 175*  BUN 98* 89* 91* 86* 90*  CREATININE 8.55* 8.22* 8.45* 8.14* 8.39*  CALCIUM 7.5* 7.2* 7.4* 7.0* 7.2*  MG  --  2.1 2.1  --   --   PHOS  --   --  9.3* 8.6* 8.9*    CBG: Recent Labs  Lab 02/05/23 1052 02/05/23 1518 02/05/23 2153 02/06/23 0751 02/06/23 1151  GLUCAP 148* 141* 153* 105* 104*    Recent Results (from the past 240 hour(s))  Urine Culture     Status: None   Collection Time: 02/02/23  2:55 PM   Specimen: Urine, Clean Catch  Result Value Ref Range Status   Specimen Description   Final    URINE, CLEAN CATCH Performed at [facility], [address], [place], [place] [address]    Special Requests   Final    NONE Performed at [facility], [address] [place], [place] [address]    Culture   Final    NO GROWTH Performed at [facility], [address], [place], [place] [address]    Report Status 02/03/2023 FINAL  Final  MRSA Next Gen by PCR, Nasal     Status: None   Collection Time: 02/02/23  8:08 PM   Specimen: Nasal Mucosa; Nasal Swab  Result Value Ref Range Status   MRSA by PCR Next Gen NOT DETECTED NOT DETECTED Final    Comment: (NOTE) The GeneXpert MRSA Assay (FDA approved for NASAL specimens only), is one component of a comprehensive MRSA colonization  surveillance program. It is not intended to diagnose MRSA infection nor to guide or monitor treatment for MRSA infections. Test performance is not FDA approved in patients less than 58 years old. Performed at [facility], [address], [place], [place] [address]   Culture, body fluid w Gram Stain-bottle     Status: None (Preliminary result)   Collection Time: 02/05/23 11:55 AM   Specimen: Ascitic  Result Value Ref Range Status   Specimen Description ASCITIC  Final   Special Requests NONE  Final   Culture   Final    NO GROWTH < 24 HOURS Performed at [facility], [address] [place], [place] [address]    Report Status PENDING  Incomplete  Gram stain     Status: None   Collection Time: 02/05/23 11:55 AM   Specimen: Ascitic  Result Value Ref Range Status   Specimen Description ASCITIC  Final   Special Requests NONE  Final   Gram Stain   Final    NO ORGANISMS SEEN WBC PRESENT,BOTH PMN AND MONONUCLEAR CYTOSPIN SMEAR Performed at [facility], [address], [place], [place] [address]    Report Status 02/05/2023 FINAL  Final     Radiology Studies: US Paracentesis  Result Date: 02/05/2023 INDICATION: Patient with history of NASH cirrhosis, recurrent ascites currently admitted with AKI and concern for SBP. Request for diagnostic only paracentesis. EXAM: ULTRASOUND GUIDED DIAGNOSTIC PARACENTESIS MEDICATIONS: 8 mL 1% lidocaine COMPLICATIONS: None immediate. PROCEDURE: Informed written consent was obtained from the patient after a discussion of the risks, benefits and alternatives to treatment. A timeout was performed prior to the initiation of the procedure. Initial ultrasound scanning demonstrates a large amount of ascites within the left lower abdominal quadrant. The left lower abdomen was prepped and draped in the usual sterile fashion. 1% lidocaine was used for local anesthesia. Following this,  a 19 gauge, 7-cm, Yueh catheter was introduced. An ultrasound image was saved for  documentation purposes. The paracentesis was performed. The catheter was removed and a dressing was applied. The patient tolerated the procedure well without immediate post procedural complication. FINDINGS: A total of approximately 100 mL of clear yellow fluid was removed for diagnostic purposes only. Samples were sent to the laboratory as requested by the clinical team. IMPRESSION: Successful ultrasound-guided paracentesis yielding 100 milliliters of peritoneal fluid. Performed by Lynnette Caffey, PA-C Electronically Signed   By: Acquanetta Belling M.D.   On: 02/05/2023 12:12    Scheduled Meds:  Chlorhexidine Gluconate Cloth  6 each Topical Daily   ciprofloxacin  250 mg Oral Q48H   folic acid  1 mg Oral Daily   insulin aspart  0-15 Units Subcutaneous TID WC   insulin detemir  6 Units Subcutaneous QHS   lactulose  10 g Oral BID   levothyroxine  125 mcg Oral Q0600   lidocaine  1 patch Transdermal Q24H   liver oil-zinc oxide   Topical TID   nystatin  1 Application Topical TID   sevelamer carbonate  800 mg Oral TID WC   sodium bicarbonate  1,300 mg Oral BID   Continuous Infusions:  albumin human 50 g (02/06/23 0816)   norepinephrine (LEVOPHED) Adult infusion 3 mcg/min (02/06/23 0431)   octreotide (SANDOSTATIN) 500 mcg in sodium chloride 0.9 % 250 mL (2 mcg/mL) infusion 50 mcg/hr (02/06/23 0459)     LOS: 4 days   Critical Care Procedure Note Authorized and Performed by: Maryln Manuel MD  Total Critical Care time:  53 mins Due to a high probability of clinically significant, life threatening deterioration, the patient required my highest level of preparedness to intervene emergently and I personally spent this critical care time directly and personally managing the patient.  This critical care time included obtaining a history; examining the patient, pulse oximetry; ordering and review of studies; arranging urgent treatment with development of a management plan; evaluation of patient's response of  treatment; frequent reassessment; and discussions with other providers.  This critical care time was performed to assess and manage the high probability of imminent and life threatening deterioration that could result in multi-organ failure.  It was exclusive of separately billable procedures and treating other patients and teaching time.    Standley Dakins, MD How to contact the [facility] Attending or Consulting provider 7A - 7P or covering provider during after hours 7P -7A, for this patient?  Check the care team in [facility] and look for a) attending/consulting TR[facility] provider listed and b) the [facility] team listed Log into [email or web address] to find provider on call.  Locate the [facility] provider you are looking for under [facility] and page to a number that you can be directly reached. If you still have difficulty reaching the provider, please page the [facility] (Director on Call) for the Hospitalists listed on amion for assistance.  02/06/2023, 12:15 PM

## 2023-02-06 NOTE — Progress Notes (Signed)
[name], M.D. Gastroenterology & Hepatology   Interval History:  Patient reports that she has presented pain in her right groin and right shoulder after she was moved on her side today in the morning.  Has also presented some nausea since then.  She was feeling well prior to this and denied having any abdominal pain, fever, chills, worsening abdominal distention. She states that she was able to urinate small amount. No melena or hematochezia. Per nursing staff, they have been decreasing her Levophed drip to achieve MAPs more than 65, but still on drip.  Inpatient Medications:  Current Facility-Administered Medications:    acetaminophen (TYLENOL) tablet 650 mg, 650 mg, Oral, Q6H PRN, 650 mg at 02/04/23 1021 **OR** acetaminophen (TYLENOL) suppository 650 mg, 650 mg, Rectal, Q6H PRN, Zierle-Ghosh, Asia B, DO   albumin human 25 % solution 50 g, 50 g, Intravenous, Daily, Estanislado Emms, MD, Last Rate: 60 mL/hr at 02/06/23 0816, 50 g at 02/06/23 0816   Chlorhexidine Gluconate Cloth 2 % PADS 6 each, 6 each, Topical, Daily, Zierle-Ghosh, Asia B, DO, 6 each at 02/06/23 0630   ciprofloxacin (CIPRO) tablet 250 mg, 250 mg, Oral, Daily, Johnson, Clanford L, MD   folic acid (FOLVITE) tablet 1 mg, 1 mg, Oral, Daily, Mahon, Courtney L, NP, 1 mg at 02/06/23 0809   insulin aspart (novoLOG) injection 0-15 Units, 0-15 Units, Subcutaneous, TID WC, Zierle-Ghosh, Asia B, DO, 2 Units at 02/05/23 1543   insulin detemir (LEVEMIR) injection 6 Units, 6 Units, Subcutaneous, QHS, Johnson, Clanford L, MD, 6 Units at 02/05/23 2108   lactulose (CHRONULAC) 10 GM/15ML solution 10 g, 10 g, Oral, BID, Marguerita Merles, Malinda Mayden, MD, 10 g at 02/06/23 0808   levothyroxine (SYNTHROID) tablet 125 mcg, 125 mcg, Oral, Q0600, Zierle-Ghosh, Asia B, DO, 125 mcg at 02/06/23 0521   lidocaine (LIDODERM) 5 % 1 patch, 1 patch, Transdermal, Q24H, Johnson, Clanford L, MD, 1 patch at 02/06/23 0932   liver oil-zinc oxide (DESITIN) 40 %  ointment, , Topical, TID, Johnson, Clanford L, MD, Given at 02/06/23 0810   norepinephrine (LEVOPHED) 4mg  in (0.016 mg/mL) premix infusion, 2-10 mcg/min, Intravenous, Titrated, Darnell Level, MD, Last Rate: 11.25 mL/hr at 02/06/23 0431, 3 mcg/min at 02/06/23 0431   nystatin (MYCOSTATIN/NYSTOP) topical powder 1 Application, 1 Application, Topical, TID, Zierle-Ghosh, Asia B, DO, 1 Application at 02/06/23 0810   octreotide (SANDOSTATIN) 500 mcg in sodium chloride 0.9 % 250 mL (2 mcg/mL) infusion, 50 mcg/hr, Intravenous, Continuous, Peeples, Bernestine Amass, MD, Last Rate: 25 mL/hr at 02/06/23 0459, 50 mcg/hr at 02/06/23 0459   ondansetron (ZOFRAN) tablet 4 mg, 4 mg, Oral, Q6H PRN, 4 mg at 02/06/23 0931 **OR** ondansetron (ZOFRAN) injection 4 mg, 4 mg, Intravenous, Q6H PRN, Zierle-Ghosh, Asia B, DO   oxyCODONE (Oxy IR/ROXICODONE) immediate release tablet 2.5 mg, 2.5 mg, Oral, Q4H PRN, Johnson, Clanford L, MD, 2.5 mg at 02/06/23 0931   phytonadione (VITAMIN K) 10 mg in dextrose 5 % 50 mL IVPB, 10 mg, Intravenous, Daily, Tiffany Kocher, PA-C, Stopped at 02/05/23 1110   sevelamer carbonate (RENVELA) tablet 800 mg, 800 mg, Oral, TID WC, Darnell Level, MD, 800 mg at 02/06/23 0809   sodium bicarbonate tablet 1,300 mg, 1,300 mg, Oral, BID, Zierle-Ghosh, Asia B, DO, 1,300 mg at 02/06/23 0809   I/O    Intake/Output Summary (Last 24 hours) at 02/06/2023 1017 Last data filed at 02/06/2023 0431 Gross per 24 hour  Intake 1371.45 ml  Output --  Net 1371.45 ml  Physical Exam: Temp:  [97.5 F (36.4 C)-99.4 F (37.4 C)] 98.4 F (36.9 C) (11/09 0817) Pulse Rate:  [64-89] 76 (11/09 0930) Resp:  [13-24] 20 (11/09 0930) BP: (103-164)/(26-130) 126/50 (11/09 0930) SpO2:  [91 %-100 %] 91 % (11/09 0930)  Temp (24hrs), Avg:98.5 F (36.9 C), Min:97.5 F (36.4 C), Max:99.4 F (37.4 C) GENERAL: The patient is AO x3, in mild distress due to nausea. HEENT: Head is normocephalic and atraumatic. EOMI are  intact. Mouth is well hydrated and without lesions. NECK: Supple. No masses LUNGS: Clear to auscultation. No presence of rhonchi/wheezing/rales. Adequate chest expansion HEART: RRR, normal s1 and s2. ABDOMEN: Soft, nontender, no guarding, no peritoneal signs, mildly distended. BS +. No masses. No pain upon palpation of her hip. EXTREMITIES: Without any cyanosis, clubbing, rash, lesions or edema. NEUROLOGIC: AOx3, no focal motor deficit. SKIN: no jaundice, no rashes  Laboratory Data: CBC:     Component Value Date/Time   WBC 5.5 02/06/2023 0510   RBC 2.38 (L) 02/06/2023 0510   HGB 7.7 (L) 02/06/2023 0510   HGB 8.7 (L) 02/01/2023 1234   HCT 23.7 (L) 02/06/2023 0510   HCT 26.2 (L) 02/01/2023 1234   PLT 21 (LL) 02/06/2023 0510   PLT 43 (LL) 02/01/2023 1234   MCV 99.6 02/06/2023 0510   MCV 101 (H) 02/01/2023 1234   MCH 32.4 02/06/2023 0510   MCHC 32.5 02/06/2023 0510   RDW 16.1 (H) 02/06/2023 0510   RDW 16.9 (H) 02/01/2023 1234   LYMPHSABS 0.6 (L) 02/05/2023 0554   LYMPHSABS 0.7 02/01/2023 1234   MONOABS 0.5 02/05/2023 0554   EOSABS 0.4 02/05/2023 0554   EOSABS 0.3 02/01/2023 1234   BASOSABS 0.0 02/05/2023 0554   BASOSABS 0.0 02/01/2023 1234   COAG:  Lab Results  Component Value Date   INR 2.0 (H) 02/06/2023   INR 1.9 (H) 02/05/2023   INR 1.7 (H) 02/04/2023    BMP:     Latest Ref Rng & Units 02/06/2023    5:10 AM 02/05/2023    5:54 AM 02/04/2023    4:16 AM  BMP  Glucose 70 - 99 mg/dL 952  841  324   BUN 8 - 23 mg/dL 90  86  91   Creatinine 0.44 - 1.00 mg/dL 4.01  0.27  2.53   Sodium 135 - 145 mmol/L 134  136  137   Potassium 3.5 - 5.1 mmol/L 3.9  4.0  4.3   Chloride 98 - 111 mmol/L 103  105  103   CO2 22 - 32 mmol/L 18  17  18    Calcium 8.9 - 10.3 mg/dL 7.2  7.0  7.4     HEPATIC:     Latest Ref Rng & Units 02/06/2023    5:10 AM 02/05/2023    5:54 AM 02/04/2023    4:16 AM  Hepatic Function  Total Protein 6.5 - 8.1 g/dL 5.1  5.0  5.3   Albumin 3.5 - 5.0 g/dL 3.7   3.6  3.6    3.7   AST 15 - 41 U/L 13  17  22    ALT 0 - 44 U/L 11  13  16    Alk Phosphatase 38 - 126 U/L 46  49  59   Total Bilirubin <1.2 mg/dL 1.9  1.2  1.3   Bilirubin, Direct 0.0 - 0.2 mg/dL   0.4     CARDIAC:  Lab Results  Component Value Date   CKTOTAL 93 01/20/2009   CKMB 1.4 01/20/2009  TROPONINI <0.03 12/02/2015      Imaging: I personally reviewed and interpreted the available labs, imaging and endoscopic files.   Assessment/Plan: [name] is a 72 y.o. female with history of NASH cirrhosis, esophageal varices s/p banding, ascites requiring paracentesis and SBP, GERD, diabetes mellitus, hypertension, HLD, CKD, chronic anemia, who was advised to come to the hospital after presenting severe AKI on routine blood workup as outpatient.  Patient had significant increase of her creatinine recently.  She was on low-dose torsemide and presented significant spike in her creatinine.  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  Unfortunately, her clinical condition has deteriorated after recent episode of SBP and she has progressively declined, now presenting with significant alteration in her kidney function. MELD  3.0 today was 31, mostly driven by her elevated creatinine.  Being followed by nephrology, who did not recommend any urgent dialysis but she is currently undergoing discussions regarding the need for dialysis in the future.  She should continue with Levophed drip and octreotide 3 times daily for HRS.  I discussed yesterday with pharmacy the use of thoroughly pressing but this is restricted to nephrology use only .  I discussed with the patient that she is at the borderline age in which she may qualify for liver transplant but this will need to be discussed with the transplant center if she is interested in this (possibly [facility]).  At this point, she has not decided if she would like to have an evaluation by them so we  will hold off on reaching a transplant center.  She would like to discuss with her family further.  It will be important to have discussion of goals of care with palliative care as well.  Her liver numbers look adequate without any significant elevation otherwise.  Patient had recent SBP.  Recent repeat paracentesis was negative for SBP.  She should be restarted on ciprofloxacin 250 mg every day.   Management of HRS per nephrology.  Continue with octreotide drip and Levophed drip Continue ciprofloxacin  250 mg daily Titrate lactulose for 2-3 BM daily Discussion of goals of care with palliative care Patient will discuss with family regarding willingness to proceed with liver transplant evaluation Folic acid supplementation Daily CBC, CMP, INR Outpatient hepatoma screening due later this month Outpatient EGD as already scheduled.  Daily folic acid supplementation  Brandy Blazing, MD Gastroenterology and Hepatology [facility] Gastroenterology

## 2023-02-06 NOTE — Plan of Care (Signed)
  Problem: Activity: Goal: Risk for activity intolerance will decrease Outcome: Not Progressing   Problem: Nutrition: Goal: Adequate nutrition will be maintained Outcome: Not Progressing   Problem: Skin Integrity: Goal: Risk for impaired skin integrity will decrease Outcome: Not Progressing   

## 2023-02-06 NOTE — Progress Notes (Signed)
CRITICAL VALUE STICKER  CRITICAL VALUE: Platelets 21  RECEIVER (on-site recipient of call): Lockie Mola, RN  MD NOTIFIED: Dr. Laural Benes  TIME OF NOTIFICATION:0728AM  RESPONSE:  awaiting orders

## 2023-02-07 DIAGNOSIS — K767 Hepatorenal syndrome: Secondary | ICD-10-CM | POA: Diagnosis present

## 2023-02-07 DIAGNOSIS — N185 Chronic kidney disease, stage 5: Secondary | ICD-10-CM | POA: Diagnosis not present

## 2023-02-07 DIAGNOSIS — N189 Chronic kidney disease, unspecified: Secondary | ICD-10-CM | POA: Diagnosis not present

## 2023-02-07 DIAGNOSIS — K7581 Nonalcoholic steatohepatitis (NASH): Secondary | ICD-10-CM | POA: Diagnosis not present

## 2023-02-07 DIAGNOSIS — N179 Acute kidney failure, unspecified: Secondary | ICD-10-CM | POA: Diagnosis not present

## 2023-02-07 DIAGNOSIS — D631 Anemia in chronic kidney disease: Secondary | ICD-10-CM

## 2023-02-07 DIAGNOSIS — D696 Thrombocytopenia, unspecified: Secondary | ICD-10-CM | POA: Diagnosis not present

## 2023-02-07 DIAGNOSIS — R630 Anorexia: Secondary | ICD-10-CM | POA: Diagnosis present

## 2023-02-07 LAB — RENAL FUNCTION PANEL
Albumin: 4 g/dL (ref 3.5–5.0)
Anion gap: 15 (ref 5–15)
BUN: 94 mg/dL — ABNORMAL HIGH (ref 8–23)
CO2: 18 mmol/L — ABNORMAL LOW (ref 22–32)
Calcium: 7.8 mg/dL — ABNORMAL LOW (ref 8.9–10.3)
Chloride: 103 mmol/L (ref 98–111)
Creatinine, Ser: 8.95 mg/dL — ABNORMAL HIGH (ref 0.44–1.00)
GFR, Estimated: 4 mL/min — ABNORMAL LOW (ref >60)
Glucose, Bld: 113 mg/dL — ABNORMAL HIGH (ref 70–99)
Phosphorus: 9.4 mg/dL — ABNORMAL HIGH (ref 2.5–4.6)
Potassium: 4.2 mmol/L (ref 3.5–5.1)
Sodium: 136 mmol/L (ref 135–145)

## 2023-02-07 LAB — HEPATIC FUNCTION PANEL
ALT: 10 U/L (ref 0–44)
AST: 12 U/L — ABNORMAL LOW (ref 15–41)
Albumin: 4.9 g/dL (ref 3.5–5.0)
Alkaline Phosphatase: 41 U/L (ref 38–126)
Bilirubin, Direct: 0.7 mg/dL — ABNORMAL HIGH (ref 0.0–0.2)
Indirect Bilirubin: 1.3 mg/dL — ABNORMAL HIGH (ref 0.3–0.9)
Total Bilirubin: 2 mg/dL — ABNORMAL HIGH (ref <1.2)
Total Protein: 6 g/dL — ABNORMAL LOW (ref 6.5–8.1)

## 2023-02-07 LAB — GLUCOSE, CAPILLARY
Glucose-Capillary: 102 mg/dL — ABNORMAL HIGH (ref 70–99)
Glucose-Capillary: 87 mg/dL (ref 70–99)
Glucose-Capillary: 138 mg/dL — ABNORMAL HIGH (ref 70–99)
Glucose-Capillary: 198 mg/dL — ABNORMAL HIGH (ref 70–99)

## 2023-02-07 LAB — PROTIME-INR
INR: 2.2 — ABNORMAL HIGH (ref 0.8–1.2)
Prothrombin Time: 24.9 s — ABNORMAL HIGH (ref 11.4–15.2)

## 2023-02-07 LAB — CBC
HCT: 25.1 % — ABNORMAL LOW (ref 36.0–46.0)
Hemoglobin: 8.3 g/dL — ABNORMAL LOW (ref 12.0–15.0)
MCH: 32.9 pg (ref 26.0–34.0)
MCHC: 33.1 g/dL (ref 30.0–36.0)
MCV: 99.6 fL (ref 80.0–100.0)
Platelets: 23 10*3/uL — CL (ref 150–400)
RBC: 2.52 MIL/uL — ABNORMAL LOW (ref 3.87–5.11)
RDW: 15.8 % — ABNORMAL HIGH (ref 11.5–15.5)
WBC: 5.4 10*3/uL (ref 4.0–10.5)
nRBC: 0 % (ref 0.0–0.2)

## 2023-02-07 MED ORDER — CIPROFLOXACIN HCL 500 MG PO TABS
250.0000 mg | ORAL_TABLET | Freq: Every day | ORAL | Status: DC
  Administered 2023-02-08 – 2023-02-09 (×2): 250 mg via ORAL
  Filled 2023-02-07 (×2): qty 1

## 2023-02-07 MED ORDER — NYSTATIN 100000 UNIT/ML MT SUSP
5.0000 mL | Freq: Four times a day (QID) | OROMUCOSAL | Status: DC | PRN
  Administered 2023-02-07: 500000 [IU] via ORAL
  Filled 2023-02-07: qty 5

## 2023-02-07 MED ORDER — NYSTATIN 100000 UNIT/ML MT SUSP: 5.0000 mL | Freq: Four times a day (QID) | OROMUCOSAL | Status: DC

## 2023-02-07 MED ORDER — OCTREOTIDE ACETATE 500 MCG/ML IJ SOLN
INTRAMUSCULAR | Status: AC
  Filled 2023-02-07: qty 1

## 2023-02-07 NOTE — Progress Notes (Signed)
Patient ID: [name], female   DOB: 1950/08/02, 72 y.o.   MRN: [id number]  [facility] Progress Note   Assessment/ Plan:   1. Acute kidney Injury on chronic kidney disease stage IIIb: Baseline creatinine has been around 2.0-2.2.  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  She does not have any acute indications for dialysis at this time however, has progressive azotemia with no recorded/poor urine output.  She had a lengthy discussion with her family members based on their conversation with Dr. Malen Gauze on Friday and she is willing to undertake dialysis realizing that this may be for the rest of her life (and entails treatments 3 times a week lasting about 4 hours at a time).  She does realize that she can stop dialysis at any point if this has a negative bearing on her quality of life.  She has significant thrombocytopenia that will likely require platelet transfusion prior to dialysis catheter placement.  I will reach out to Dr. Robyne Peers with the surgery service to assess their comfort level with doing this here at [facility] versus being done by IR (alternatively, may simply start with a temporary dialysis catheter).   2.  NASH cirrhosis: Ongoing close management by gastroenterology and has been started on ciprofloxacin for SBP prophylaxis along with lactulose to avoid hepatic encephalopathy.  Discussions underway to assess for transplant candidacy and parallel discussions with the palliative care service. 3.  Anemia/thrombocytopenia: Thrombocytopenia is likely related to her cirrhosis/portal hypertension and splenic sequestration with anemia secondary to chronic disease including CKD/cirrhosis.  No indication at this time for PRBC transfusion and she remains on folic acid supplementation.  No overt bleeding noted. 4.  Metabolic acidosis: Continue oral sodium bicarbonate with plans to  discontinue this when dialysis is started.  Subjective:   Reports to be feeling fair except for increased abdominal distention.  Expresses gratitude for the care that she has received.   Objective:   BP (!) 134/41   Pulse 65   Temp 98.1 F (36.7 C) (Oral)   Resp 14   Ht 5\' 6"  (1.676 m)   Wt 96.9 kg   SpO2 96%   BMI 34.48 kg/m   Intake/Output Summary (Last 24 hours) at 02/07/2023 1204 Last data filed at 02/07/2023 1053 Gross per 24 hour  Intake 1616.45 ml  Output --  Net 1616.45 ml   Weight change:   Physical Exam: Gen: Comfortably sitting up in bed, eating lunch CVS: Pulse regular rhythm, normal rate, ESM over outflow track audible.  Right IJ port in place Resp: Poor inspiratory effort with decreased breath sounds over bases, no rales/rhonchi Abd: Soft, distended, nontender, bowel sounds normal Ext: No lower extremity edema.  Imaging: No results found.  Labs: BMET Recent Labs  Lab 02/01/23 1234 02/02/23 1227 02/03/23 0323 02/04/23 0416 02/05/23 0554 02/06/23 0510 02/07/23 0335  NA 140 137 137 137 136 134* 136  K 4.4 4.0 4.3 4.3 4.0 3.9 4.2  CL 100 104 104 103 105 103 103  CO2 17* 20* 18* 18* 17* 18* 18*  GLUCOSE 203* 95 110* 128* 158* 175* 113*  BUN 86* 98* 89* 91* 86* 90* 94*  CREATININE 7.26* 8.55* 8.22* 8.45* 8.14* 8.39* 8.95*  CALCIUM 7.6* 7.5* 7.2* 7.4* 7.0* 7.2* 7.8*  PHOS  --   --   --  9.3* 8.6* 8.9* 9.4*   CBC Recent Labs  Lab 02/01/23 1234  02/02/23 1227 02/02/23 1227 02/03/23 0323 02/04/23 0416 02/05/23 0554 02/05/23 2015 02/06/23 0510 02/07/23 0335  WBC 4.9 4.6   < > 4.1 5.4 5.5  --  5.5 5.4  NEUTROABS 3.3 3.1  --  2.9  --  4.0  --   --   --   HGB 8.7* 8.7*   < > 8.3* 7.6* 7.3* 8.4* 7.7* 8.3*  HCT 26.2* 26.8*   < > 26.2* 23.7* 22.1* 25.2* 23.7* 25.1*  MCV 101* 101.5*   < > 101.9* 101.7* 100.5*  --  99.6 99.6  PLT 43* DCLMP   < > 26* 34* 27*  --  21* 23*   < > = values in this interval not displayed.    Medications:      Chlorhexidine Gluconate Cloth  6 each Topical Daily   ciprofloxacin  250 mg Oral Q48H   folic acid  1 mg Oral Daily   insulin aspart  0-15 Units Subcutaneous TID WC   insulin detemir  6 Units Subcutaneous QHS   lactulose  10 g Oral BID   levothyroxine  125 mcg Oral Q0600   lidocaine  1 patch Transdermal Q24H   liver oil-zinc oxide   Topical TID   nystatin  1 Application Topical TID   sevelamer carbonate  800 mg Oral TID WC   sodium bicarbonate  1,300 mg Oral BID    Zetta Bills, MD 02/07/2023, 12:04 PM

## 2023-02-07 NOTE — Plan of Care (Signed)

## 2023-02-07 NOTE — Progress Notes (Signed)
Case discussed with on call physician - transplant hepatologist at [facility]. Main limitation may be functionality of patient - will need to have full PT evaluation of mobility and frailty. Spoke with hospitalist, who will request consult.

## 2023-02-07 NOTE — Progress Notes (Signed)
PROGRESS NOTE   [name]  QIH:[id number] DOB: Feb 02, 1951 DOA: 02/02/2023 PCP: Benita Stabile, MD   Chief Complaint  Patient presents with   Abnormal Lab   Level of care: ICU  Brief Admission History:   72 y.o. female with medical history significant of anemia, CKD, is mellitus type II, depression, GERD, hyperlipidemia, hypertension, hypothyroidism, retinopathy, and more presents the ED with a chief complaint of abnormal labs.  Patient reports she woke up feeling like her normal self, and everybody was telling her that she needed to go to the ER.  Apparently patient had recently been admitted for hepatic encephalopathy.  She was discharged on October 22.  She had a follow-up appointment with GI where they drew routine labs.  They called her and told her that she needed to come into the ER due to her BUN and creatinine.  Patient reports she is in no pain at the time of my exam.  She reports she has been having diarrhea but that is normal for her.  I would expect her to have diarrhea because she is on lactulose.  She denies any dizziness, chest pain, palpitations, shortness of breath.  Patient reports she is not sure why she is here.  On chart review patient recently discharged after a 9-day stay in the hospital.  She was found to have hepatic encephalopathy at that time.  She had no history of hepatic encephalopathy.  Her ammonia was as high as 130.  She also appeared to be dehydrated.  Her symptoms improved with a lactulose enema.  She completed IV ceftriaxone x 5 days for spontaneous bacterial peritonitis.  Her ammonia was down to 47 at discharge.  She did have a paracentesis on October 14 with 5.5 L of cloudy ascitic fluid removed.  Patient was sent home on lactulose to be titrated to achieve 2-3 mushy stools per day per discharge summary.  Patient had an acute kidney injury at that admission as well.  Her creatinine peaked at 2.77 at that time.  She was discharged on ciprofloxacin from that  admission.  Patient has no other complaints at this time.   Patient does not smoke and does not drink.  Patient reports she would want to be full code.  Son at bedside reports that she had a DNR in the past, but revoked it.  Patient is full code.     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 Appreciate Dr. Valentino Nose recommendation and GI consultation  - Continue home bicarb - central line placed on 11/6  - Avoid nephrotoxic agents when possible - follow renal function panel  - no meaningful renal recovery so far, continue to follow - pt discussed with family and would be willing to have hemodialysis and liver transplant if accepted  Intake/Output Summary (Last 24 hours) at 02/07/2023 1216 Last data filed at 02/07/2023 1053 Gross per 24 hour  Intake 1616.45 ml  Output --  Net 1616.45 ml   Hypotension - Blood pressure as low as 85/36 at presentation - Family reports that blood pressure does run low at home - After 3 L of fluid her systolic was still in the low 875I, requiring pressor support - Monitor on stepdown ICU - Hold Inderal and torsemide - Nephrology started on IV albumin and IV norepinephrine infusion for goal  MAP>70  and central line placed 11/6   UTI (urinary tract infection) - Ruled Out   - UA suspicious for UTI - Urine culture no growth  - Rocephin started in the ED - Continue Rocephin x 3 doses completed - CT abdomen pelvis did not show any obstructive uropathy or calculi present - could be a contaminated specimen given squamous cells  Anemia due to chronic kidney disease - Likely related to chronic disease and acute renal failure  - Trend - No active bleeding reported  Thrombocytopenia - severe  - History of thrombocytopenia - Platelet count down to 26 - Hold pharmaceutical DVT  prophylaxis - Continue to monitor - due to chronic liver disease   Liver cirrhosis secondary to NASH - Reducing lactulose to as needed for mild constipation given patient's hypovolemic status and AKI - CT did redemonstrate cirrhosis and portal venous hypertension as well as ascites - Dr Levon Hedger recommended restarting cipro for SBP prophylaxis renal dosed, spoke with pharm D and orders started 11/9 - ciprofloxacin dosed per pharm D  Type 2 diabetes mellitus with diabetic chronic kidney disease  - Sliding scale coverage (renal sensitive) - Monitoring CBG CBG (last 3)  Recent Labs    02/06/23 2109 02/07/23 0735 02/07/23 1137  GLUCAP 139* 102* 87   Acquired hypothyroidism - Continue Synthroid  DVT prophylaxis: SCDs Code Status: Full  Family Communication: son at bedside 11/6  Disposition: TBD    Consultants:  Nephrology  Palliative care  Gastroenterology Procedures:   Antimicrobials:    Subjective: Pt reports abdomen getting bigger.  No urination today.    Objective: Vitals:   02/07/23 0800 02/07/23 0900 02/07/23 1000 02/07/23 1100  BP: (!) 111/51 (!) 120/37 (!) 122/40 (!) 134/41  Pulse: (!) 59 61 65 65  Resp: 20 11 18 14   Temp: 98.1 F (36.7 C)   98.1 F (36.7 C)  TempSrc: Oral   Oral  SpO2: 100% 98% 94% 96%  Weight:      Height:        Intake/Output Summary (Last 24 hours) at 02/07/2023 1216 Last data filed at 02/07/2023 1053 Gross per 24 hour  Intake 1616.45 ml  Output --  Net 1616.45 ml   Filed Weights   02/05/23 0600  Weight: 96.9 kg   Examination:  General exam: chronically ill appearing female, awake, Appears calm and comfortable  Respiratory system: Clear to auscultation. Respiratory effort normal. Cardiovascular system: normal S1 & S2 heard. No JVD, murmurs, rubs, gallops or clicks. No pedal edema. Gastrointestinal system: Abdomen is mildly distended, soft and nontender. No fluid wave. No masses felt. Normal bowel sounds heard. Central  nervous system: Alert and oriented. No focal neurological deficits. Extremities: Symmetric 5 x 5 power. Skin: No rashes, lesions or ulcers. Psychiatry: Judgement and insight appear normal. Mood & affect appropriate.   Data Reviewed: I have personally reviewed following labs and imaging studies  CBC: Recent Labs  Lab 02/01/23 1234 02/02/23 1227 02/02/23 1227 02/03/23 0323 02/04/23 0416 02/05/23 0554 02/05/23 2015 02/06/23 0510 02/07/23 0335  WBC 4.9 4.6   < > 4.1 5.4 5.5  --  5.5 5.4  NEUTROABS 3.3 3.1  --  2.9  --  4.0  --   --   --   HGB 8.7* 8.7*   < > 8.3* 7.6* 7.3* 8.4* 7.7* 8.3*  HCT 26.2* 26.8*   < > 26.2* 23.7* 22.1* 25.2* 23.7* 25.1*  MCV 101* 101.5*   < > 101.9* 101.7* 100.5*  --  99.6  99.6  PLT 43* DCLMP   < > 26* 34* 27*  --  21* 23*   < > = values in this interval not displayed.    Basic Metabolic Panel: Recent Labs  Lab 02/03/23 0323 02/04/23 0416 02/05/23 0554 02/06/23 0510 02/07/23 0335  NA 137 137 136 134* 136  K 4.3 4.3 4.0 3.9 4.2  CL 104 103 105 103 103  CO2 18* 18* 17* 18* 18*  GLUCOSE 110* 128* 158* 175* 113*  BUN 89* 91* 86* 90* 94*  CREATININE 8.22* 8.45* 8.14* 8.39* 8.95*  CALCIUM 7.2* 7.4* 7.0* 7.2* 7.8*  MG 2.1 2.1  --   --   --   PHOS  --  9.3* 8.6* 8.9* 9.4*    CBG: Recent Labs  Lab 02/06/23 1151 02/06/23 1650 02/06/23 2109 02/07/23 0735 02/07/23 1137  GLUCAP 104* 128* 139* 102* 87    Recent Results (from the past 240 hour(s))  Urine Culture     Status: None   Collection Time: 02/02/23  2:55 PM   Specimen: Urine, Clean Catch  Result Value Ref Range Status   Specimen Description   Final    URINE, CLEAN CATCH Performed at [facility], [address], [place], [place] [address]    Special Requests   Final    NONE Performed at [facility], [address] [place], [place] [address]    Culture   Final    NO GROWTH Performed at [facility], [address], [place], [place] [address]    Report Status 02/03/2023  FINAL  Final  MRSA Next Gen by PCR, Nasal     Status: None   Collection Time: 02/02/23  8:08 PM   Specimen: Nasal Mucosa; Nasal Swab  Result Value Ref Range Status   MRSA by PCR Next Gen NOT DETECTED NOT DETECTED Final    Comment: (NOTE) The GeneXpert MRSA Assay (FDA approved for NASAL specimens only), is one component of a comprehensive MRSA colonization surveillance program. It is not intended to diagnose MRSA infection nor to guide or monitor treatment for MRSA infections. Test performance is not FDA approved in patients less than 75 years old. Performed at [facility], [address] [place], [place] [address]   Culture, body fluid w Gram Stain-bottle     Status: None (Preliminary result)   Collection Time: 02/05/23 11:55 AM   Specimen: Ascitic  Result Value Ref Range Status   Specimen Description ASCITIC  Final   Special Requests NONE  Final   Culture   Final    NO GROWTH 2 DAYS Performed at [facility], [address], [place], [place] [address]    Report Status PENDING  Incomplete  Gram stain     Status: None   Collection Time: 02/05/23 11:55 AM   Specimen: Ascitic  Result Value Ref Range Status   Specimen Description ASCITIC  Final   Special Requests NONE  Final   Gram Stain   Final    NO ORGANISMS SEEN WBC PRESENT,BOTH PMN AND MONONUCLEAR CYTOSPIN SMEAR Performed at [facility], [address], [place], [place] [address]    Report Status 02/05/2023 FINAL  Final     Radiology Studies: No results found.  Scheduled Meds:  Chlorhexidine Gluconate Cloth  6 each Topical Daily   ciprofloxacin  250 mg Oral Q48H   folic acid  1 mg Oral Daily   insulin aspart  0-15 Units Subcutaneous TID WC   insulin detemir  6 Units Subcutaneous QHS  lactulose  10 g Oral BID   levothyroxine  125 mcg Oral Q0600   lidocaine  1 patch Transdermal Q24H   liver oil-zinc oxide   Topical TID   nystatin  1 Application Topical TID   sevelamer carbonate  800 mg Oral TID WC   sodium  bicarbonate  1,300 mg Oral BID   Continuous Infusions:  albumin human 50 g (02/07/23 0832)   norepinephrine (LEVOPHED) Adult infusion 2 mcg/min (02/07/23 1053)   octreotide (SANDOSTATIN) 500 mcg in sodium chloride 0.9 % 250 mL (2 mcg/mL) infusion 50 mcg/hr (02/07/23 1139)    LOS: 5 days   Critical Care Procedure Note Authorized and Performed by: Maryln Manuel MD  Total Critical Care time:  56 mins Due to a high probability of clinically significant, life threatening deterioration, the patient required my highest level of preparedness to intervene emergently and I personally spent this critical care time directly and personally managing the patient.  This critical care time included obtaining a history; examining the patient, pulse oximetry; ordering and review of studies; arranging urgent treatment with development of a management plan; evaluation of patient's response of treatment; frequent reassessment; and discussions with other providers.  This critical care time was performed to assess and manage the high probability of imminent and life threatening deterioration that could result in multi-organ failure.  It was exclusive of separately billable procedures and treating other patients and teaching time.    Standley Dakins, MD How to contact the [facility] Attending or Consulting provider 7A - 7P or covering provider during after hours 7P -7A, for this patient?  Check the care team in [facility] and look for a) attending/consulting [facility] provider listed and b) the [facility] team listed Log into [email or web address] to find provider on call.  Locate the [facility] provider you are looking for under [facility] and page to a number that you can be directly reached. If you still have difficulty reaching the provider, please page the [facility] (Director on Call) for the Hospitalists listed on amion for assistance.  02/07/2023, 12:16 PM

## 2023-02-07 NOTE — Progress Notes (Signed)
Critical Lab: Platelets 23  Dr. Carren Rang notified.

## 2023-02-07 NOTE — Progress Notes (Signed)
[name], M.D. Gastroenterology & Hepatology   Interval History:  No acute events overnight. Patient reports feeling okay, denies any abdominal pain or abdominal distention, although she noted that her abdomen has progressively enlarged.  No nausea or vomiting today.  No fever or chills. Still on Levophed drip. Has not produced any urine.  Inpatient Medications:  Current Facility-Administered Medications:    acetaminophen (TYLENOL) tablet 650 mg, 650 mg, Oral, Q6H PRN, 650 mg at 02/04/23 1021 **OR** acetaminophen (TYLENOL) suppository 650 mg, 650 mg, Rectal, Q6H PRN, Zierle-Ghosh, Asia B, DO   albumin human 25 % solution 50 g, 50 g, Intravenous, Daily, Estanislado Emms, MD, Last Rate: 60 mL/hr at 02/07/23 0832, 50 g at 02/07/23 3086   Chlorhexidine Gluconate Cloth 2 % PADS 6 each, 6 each, Topical, Daily, Zierle-Ghosh, Asia B, DO, 6 each at 02/07/23 0834   ciprofloxacin (CIPRO) tablet 250 mg, 250 mg, Oral, Q48H, Johnson, Clanford L, MD, 250 mg at 02/06/23 1157   folic acid (FOLVITE) tablet 1 mg, 1 mg, Oral, Daily, Mahon, Courtney L, NP, 1 mg at 02/07/23 0833   insulin aspart (novoLOG) injection 0-15 Units, 0-15 Units, Subcutaneous, TID WC, Zierle-Ghosh, Asia B, DO, 2 Units at 02/05/23 1543   insulin detemir (LEVEMIR) injection 6 Units, 6 Units, Subcutaneous, QHS, Johnson, Clanford L, MD, 6 Units at 02/06/23 2148   lactulose (CHRONULAC) 10 GM/15ML solution 10 g, 10 g, Oral, BID, Marguerita Merles, Erion Hermans, MD, 10 g at 02/07/23 5784   levothyroxine (SYNTHROID) tablet 125 mcg, 125 mcg, Oral, Q0600, Zierle-Ghosh, Asia B, DO, 125 mcg at 02/07/23 0455   lidocaine (LIDODERM) 5 % 1 patch, 1 patch, Transdermal, Q24H, Johnson, Clanford L, MD, 1 patch at 02/06/23 0932   liver oil-zinc oxide (DESITIN) 40 % ointment, , Topical, TID, Laural Benes, Clanford L, MD, Given at 02/07/23 0834   norepinephrine (LEVOPHED) 4mg  in (0.016 mg/mL) premix infusion, 2-10 mcg/min, Intravenous, Titrated, Darnell Level, MD, Last Rate: 7.5 mL/hr at 02/07/23 0220, 2 mcg/min at 02/07/23 0220   nystatin (MYCOSTATIN) 100000 UNIT/ML suspension 500,000 Units, 5 mL, Oral, QID PRN, Laural Benes, Clanford L, MD   nystatin (MYCOSTATIN/NYSTOP) topical powder 1 Application, 1 Application, Topical, TID, Zierle-Ghosh, Asia B, DO, 1 Application at 02/07/23 0834   octreotide (SANDOSTATIN) 500 mcg in sodium chloride 0.9 % 250 mL (2 mcg/mL) infusion, 50 mcg/hr, Intravenous, Continuous, Darnell Level, MD, Last Rate: 25 mL/hr at 02/07/23 0220, 50 mcg/hr at 02/07/23 0220   ondansetron (ZOFRAN) tablet 4 mg, 4 mg, Oral, Q6H PRN, 4 mg at 02/06/23 0931 **OR** ondansetron (ZOFRAN) injection 4 mg, 4 mg, Intravenous, Q6H PRN, Zierle-Ghosh, Asia B, DO   oxyCODONE (Oxy IR/ROXICODONE) immediate release tablet 2.5 mg, 2.5 mg, Oral, Q4H PRN, Laural Benes, Clanford L, MD, 2.5 mg at 02/06/23 2147   sevelamer carbonate (RENVELA) tablet 800 mg, 800 mg, Oral, TID WC, Darnell Level, MD, 800 mg at 02/07/23 6962   sodium bicarbonate tablet 1,300 mg, 1,300 mg, Oral, BID, Zierle-Ghosh, Asia B, DO, 1,300 mg at 02/07/23 9528   I/O    Intake/Output Summary (Last 24 hours) at 02/07/2023 1023 Last data filed at 02/07/2023 0400 Gross per 24 hour  Intake 1393.31 ml  Output --  Net 1393.31 ml     Physical Exam: Temp:  [97.8 F (36.6 C)-98.6 F (37 C)] 98.1 F (36.7 C) (11/10 0800) Pulse Rate:  [62-78] 68 (11/10 0600) Resp:  [13-26] 13 (11/10 0600) BP: (91-131)/(32-99) 122/38 (11/10 0600) SpO2:  [84 %-100 %] 99 % (  11/10 0600)  Temp (24hrs), Avg:98.2 F (36.8 C), Min:97.8 F (36.6 C), Max:98.6 F (37 C) GENERAL: The patient is AO x3, in mild distress due to nausea. HEENT: Head is normocephalic and atraumatic. EOMI are intact. Mouth is well hydrated and without lesions. NECK: Supple. No masses LUNGS: Clear to auscultation. No presence of rhonchi/wheezing/rales. Adequate chest expansion HEART: RRR, normal s1 and s2. ABDOMEN: Soft, nontender,  no guarding, no peritoneal signs, mildly distended. BS +. No masses. No pain upon palpation of her hip. EXTREMITIES: Without any cyanosis, clubbing, rash, lesions , has +2 lower extremity edema up to both knees NEUROLOGIC: AOx3, no focal motor deficit. SKIN: no jaundice, no rashes  Laboratory Data: CBC:     Component Value Date/Time   WBC 5.4 02/07/2023 0335   RBC 2.52 (L) 02/07/2023 0335   HGB 8.3 (L) 02/07/2023 0335   HGB 8.7 (L) 02/01/2023 1234   HCT 25.1 (L) 02/07/2023 0335   HCT 26.2 (L) 02/01/2023 1234   PLT 23 (LL) 02/07/2023 0335   PLT 43 (LL) 02/01/2023 1234   MCV 99.6 02/07/2023 0335   MCV 101 (H) 02/01/2023 1234   MCH 32.9 02/07/2023 0335   MCHC 33.1 02/07/2023 0335   RDW 15.8 (H) 02/07/2023 0335   RDW 16.9 (H) 02/01/2023 1234   LYMPHSABS 0.6 (L) 02/05/2023 0554   LYMPHSABS 0.7 02/01/2023 1234   MONOABS 0.5 02/05/2023 0554   EOSABS 0.4 02/05/2023 0554   EOSABS 0.3 02/01/2023 1234   BASOSABS 0.0 02/05/2023 0554   BASOSABS 0.0 02/01/2023 1234   COAG:  Lab Results  Component Value Date   INR 2.0 (H) 02/06/2023   INR 1.9 (H) 02/05/2023   INR 1.7 (H) 02/04/2023    BMP:     Latest Ref Rng & Units 02/07/2023    3:35 AM 02/06/2023    5:10 AM 02/05/2023    5:54 AM  BMP  Glucose 70 - 99 mg/dL 010  272  536   BUN 8 - 23 mg/dL 94  90  86   Creatinine 0.44 - 1.00 mg/dL 6.44  0.34  7.42   Sodium 135 - 145 mmol/L 136  134  136   Potassium 3.5 - 5.1 mmol/L 4.2  3.9  4.0   Chloride 98 - 111 mmol/L 103  103  105   CO2 22 - 32 mmol/L 18  18  17    Calcium 8.9 - 10.3 mg/dL 7.8  7.2  7.0     HEPATIC:     Latest Ref Rng & Units 02/07/2023    3:35 AM 02/06/2023    5:10 AM 02/05/2023    5:54 AM  Hepatic Function  Total Protein 6.5 - 8.1 g/dL  5.1  5.0   Albumin 3.5 - 5.0 g/dL 4.0  3.7  3.6   AST 15 - 41 U/L  13  17   ALT 0 - 44 U/L  11  13   Alk Phosphatase 38 - 126 U/L  46  49   Total Bilirubin <1.2 mg/dL  1.9  1.2     CARDIAC:  Lab Results  Component Value Date    CKTOTAL 93 01/20/2009   CKMB 1.4 01/20/2009   TROPONINI <0.03 12/02/2015      Imaging: I personally reviewed and interpreted the available labs, imaging and endoscopic files.   Assessment/Plan: [name] is a 72 y.o. female with history of NASH cirrhosis, esophageal varices s/p banding, ascites requiring paracentesis and SBP, GERD, diabetes mellitus, hypertension, HLD, CKD, chronic  anemia, who was advised to come to the hospital after presenting severe AKI on routine blood workup as outpatient.   Patient had significant increase of her creatinine recently.  She was on low-dose torsemide and presented significant spike in her creatinine.  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  Unfortunately, her clinical condition has deteriorated after recent episode of SBP and she has progressively declined, now presenting with significant alteration in her kidney function. MELD  3.0 yesterday was 31, mostly driven by her elevated creatinine (no MELD labs drawn today, reordered).  Being followed by nephrology, who did not recommend urgent dialysis but she is currently undergoing discussions regarding the need for dialysis in the future.  She should continue with Levophed drip and octreotide drip for HRS.  I discussed yesterday with pharmacy the use of thoroughly pressing but this is restricted to nephrology use only .   I discussed with the patient that she is at the borderline age in which she may qualify for liver transplant but this will need to be discussed with the transplant center if she is interested in this (possibly [facility]).  The patient discussed with her family yesterday and they will like to consider all options, even if it means an aggressive option such as liver transplant.  Will also recommend having discussion of goals of care with palliative care as well.    Patient had recent SBP.  Recent repeat paracentesis was negative for  SBP.  She was restarted ciprofloxacin 250 mg, renally dosed per pharmacy   Management of HRS per nephrology.  Continue with octreotide drip and Levophed drip Continue ciprofloxacin 250 mg, renally dosed Titrate lactulose for 2-3 BM daily Discussion of goals of care with palliative care Will reach [facility] transplant hepatology to discuss her case and potential candidacy for liver transplant evaluation Hold diuretics for now Folic acid supplementation Daily CBC, CMP, INR Outpatient hepatoma screening due later this month Outpatient EGD as already scheduled.  Daily folic acid supplementation   Brandy Blazing, MD Gastroenterology and Hepatology [facility] Gastroenterology

## 2023-02-08 ENCOUNTER — Inpatient Hospital Stay (HOSPITAL_COMMUNITY): Payer: Medicare Other

## 2023-02-08 ENCOUNTER — Ambulatory Visit (HOSPITAL_COMMUNITY): Admission: RE | Admit: 2023-02-08 | Payer: Medicare Other | Source: Ambulatory Visit

## 2023-02-08 ENCOUNTER — Encounter: Payer: Self-pay | Admitting: Hematology

## 2023-02-08 ENCOUNTER — Encounter (HOSPITAL_COMMUNITY): Payer: Self-pay | Admitting: Family Medicine

## 2023-02-08 ENCOUNTER — Inpatient Hospital Stay (HOSPITAL_COMMUNITY): Admit: 2023-02-08 | Payer: Medicare Other

## 2023-02-08 ENCOUNTER — Telehealth (INDEPENDENT_AMBULATORY_CARE_PROVIDER_SITE_OTHER): Payer: Self-pay | Admitting: *Deleted

## 2023-02-08 DIAGNOSIS — Z7189 Other specified counseling: Secondary | ICD-10-CM | POA: Diagnosis not present

## 2023-02-08 DIAGNOSIS — Z515 Encounter for palliative care: Secondary | ICD-10-CM

## 2023-02-08 DIAGNOSIS — N179 Acute kidney failure, unspecified: Secondary | ICD-10-CM | POA: Diagnosis not present

## 2023-02-08 DIAGNOSIS — K767 Hepatorenal syndrome: Secondary | ICD-10-CM | POA: Diagnosis not present

## 2023-02-08 DIAGNOSIS — N189 Chronic kidney disease, unspecified: Secondary | ICD-10-CM | POA: Diagnosis not present

## 2023-02-08 DIAGNOSIS — D696 Thrombocytopenia, unspecified: Secondary | ICD-10-CM | POA: Diagnosis not present

## 2023-02-08 DIAGNOSIS — K7581 Nonalcoholic steatohepatitis (NASH): Secondary | ICD-10-CM | POA: Diagnosis not present

## 2023-02-08 LAB — COMPREHENSIVE METABOLIC PANEL
ALT: 10 U/L (ref 0–44)
AST: 13 U/L — ABNORMAL LOW (ref 15–41)
Albumin: 3.8 g/dL (ref 3.5–5.0)
Alkaline Phosphatase: 41 U/L (ref 38–126)
Anion gap: 16 — ABNORMAL HIGH (ref 5–15)
BUN: 99 mg/dL — ABNORMAL HIGH (ref 8–23)
CO2: 16 mmol/L — ABNORMAL LOW (ref 22–32)
Calcium: 7.7 mg/dL — ABNORMAL LOW (ref 8.9–10.3)
Chloride: 104 mmol/L (ref 98–111)
Creatinine, Ser: 9.04 mg/dL — ABNORMAL HIGH (ref 0.44–1.00)
GFR, Estimated: 4 mL/min — ABNORMAL LOW (ref >60)
Glucose, Bld: 202 mg/dL — ABNORMAL HIGH (ref 70–99)
Potassium: 4.1 mmol/L (ref 3.5–5.1)
Sodium: 136 mmol/L (ref 135–145)
Total Bilirubin: 1.8 mg/dL — ABNORMAL HIGH (ref <1.2)
Total Protein: 5.5 g/dL — ABNORMAL LOW (ref 6.5–8.1)

## 2023-02-08 LAB — CBC
HCT: 24.9 % — ABNORMAL LOW (ref 36.0–46.0)
HCT: 23.1 % — ABNORMAL LOW (ref 36.0–46.0)
Hemoglobin: 8.1 g/dL — ABNORMAL LOW (ref 12.0–15.0)
Hemoglobin: 7.7 g/dL — ABNORMAL LOW (ref 12.0–15.0)
MCH: 32.7 pg (ref 26.0–34.0)
MCH: 33.6 pg (ref 26.0–34.0)
MCHC: 32.5 g/dL (ref 30.0–36.0)
MCHC: 33.3 g/dL (ref 30.0–36.0)
MCV: 100.4 fL — ABNORMAL HIGH (ref 80.0–100.0)
MCV: 100.9 fL — ABNORMAL HIGH (ref 80.0–100.0)
Platelets: 23 10*3/uL — CL (ref 150–400)
Platelets: 23 10*3/uL — CL (ref 150–400)
RBC: 2.48 MIL/uL — ABNORMAL LOW (ref 3.87–5.11)
RBC: 2.29 MIL/uL — ABNORMAL LOW (ref 3.87–5.11)
RDW: 15.6 % — ABNORMAL HIGH (ref 11.5–15.5)
RDW: 15.8 % — ABNORMAL HIGH (ref 11.5–15.5)
WBC: 6.4 10*3/uL (ref 4.0–10.5)
WBC: 6.5 10*3/uL (ref 4.0–10.5)
nRBC: 0 % (ref 0.0–0.2)
nRBC: 0 % (ref 0.0–0.2)

## 2023-02-08 LAB — GLUCOSE, CAPILLARY
Glucose-Capillary: 165 mg/dL — ABNORMAL HIGH (ref 70–99)
Glucose-Capillary: 120 mg/dL — ABNORMAL HIGH (ref 70–99)
Glucose-Capillary: 167 mg/dL — ABNORMAL HIGH (ref 70–99)
Glucose-Capillary: 156 mg/dL — ABNORMAL HIGH (ref 70–99)

## 2023-02-08 LAB — RENAL FUNCTION PANEL
Albumin: 4.1 g/dL (ref 3.5–5.0)
Anion gap: 16 — ABNORMAL HIGH (ref 5–15)
BUN: 102 mg/dL — ABNORMAL HIGH (ref 8–23)
CO2: 17 mmol/L — ABNORMAL LOW (ref 22–32)
Calcium: 7.6 mg/dL — ABNORMAL LOW (ref 8.9–10.3)
Chloride: 104 mmol/L (ref 98–111)
Creatinine, Ser: 8.72 mg/dL — ABNORMAL HIGH (ref 0.44–1.00)
GFR, Estimated: 4 mL/min — ABNORMAL LOW (ref >60)
Glucose, Bld: 210 mg/dL — ABNORMAL HIGH (ref 70–99)
Phosphorus: 8.4 mg/dL — ABNORMAL HIGH (ref 2.5–4.6)
Potassium: 4.2 mmol/L (ref 3.5–5.1)
Sodium: 137 mmol/L (ref 135–145)

## 2023-02-08 LAB — PROTIME-INR
INR: 2.1 — ABNORMAL HIGH (ref 0.8–1.2)
Prothrombin Time: 23.8 s — ABNORMAL HIGH (ref 11.4–15.2)

## 2023-02-08 LAB — HEPATITIS B SURFACE ANTIGEN: Hepatitis B Surface Ag: NONREACTIVE

## 2023-02-08 MED ORDER — CHLORHEXIDINE GLUCONATE CLOTH 2 % EX PADS: 6.0000 | MEDICATED_PAD | Freq: Every day | CUTANEOUS | Status: DC

## 2023-02-08 MED ORDER — LIDOCAINE 5 % EX PTCH
1.0000 | MEDICATED_PATCH | Freq: Every day | CUTANEOUS | Status: DC | PRN
  Administered 2023-02-11: 1 via TRANSDERMAL
  Filled 2023-02-08: qty 1

## 2023-02-08 MED ORDER — HEPARIN SODIUM (PORCINE) 1000 UNIT/ML DIALYSIS
1000.0000 [IU] | INTRAMUSCULAR | Status: DC | PRN
  Administered 2023-02-09: 2400 [IU]
  Filled 2023-02-08 (×3): qty 1

## 2023-02-08 MED ORDER — LORAZEPAM 2 MG/ML IJ SOLN
INTRAMUSCULAR | Status: AC
  Administered 2023-02-08: 1 mg via INTRAVENOUS
  Filled 2023-02-08: qty 1

## 2023-02-08 MED ORDER — LIDOCAINE HCL (PF) 1 % IJ SOLN
5.0000 mL | INTRAMUSCULAR | Status: DC | PRN
Start: 2023-02-08 — End: 2023-02-09

## 2023-02-08 MED ORDER — LIDOCAINE-PRILOCAINE 2.5-2.5 % EX CREA: 1.0000 | TOPICAL_CREAM | CUTANEOUS | Status: DC | PRN

## 2023-02-08 MED ORDER — ALTEPLASE 2 MG IJ SOLR: 2.0000 mg | Freq: Once | INTRAMUSCULAR | Status: DC | PRN

## 2023-02-08 MED ORDER — FAMOTIDINE 20 MG PO TABS
10.0000 mg | ORAL_TABLET | Freq: Every day | ORAL | Status: DC
  Administered 2023-02-09 – 2023-02-10 (×2): 10 mg via ORAL
  Filled 2023-02-08 (×3): qty 1

## 2023-02-08 MED ORDER — PENTAFLUOROPROP-TETRAFLUOROETH EX AERO: 1.0000 | INHALATION_SPRAY | CUTANEOUS | Status: DC | PRN

## 2023-02-08 MED ORDER — FENTANYL CITRATE PF 50 MCG/ML IJ SOSY
12.5000 ug | PREFILLED_SYRINGE | INTRAMUSCULAR | Status: DC | PRN
  Administered 2023-02-08 (×2): 12.5 ug via INTRAVENOUS
  Administered 2023-02-08: 25 ug via INTRAVENOUS
  Administered 2023-02-09 – 2023-02-11 (×3): 12.5 ug via INTRAVENOUS
  Filled 2023-02-08 (×6): qty 1

## 2023-02-08 MED ORDER — LORAZEPAM 2 MG/ML IJ SOLN: 1.0000 mg | Freq: Once | INTRAMUSCULAR | Status: AC

## 2023-02-08 MED ORDER — ANTICOAGULANT SODIUM CITRATE 4% (200MG/5ML) IV SOLN: 5.0000 mL | Status: DC | PRN

## 2023-02-08 NOTE — Evaluation (Signed)
Physical Therapy Evaluation Patient Details Name: [name] MRN: [id number] DOB: 1950-06-15 Today's Date: 02/08/2023  History of Present Illness  [name] is a 72 y.o. female with medical history significant of anemia, CKD, is mellitus type II, depression, GERD, hyperlipidemia, hypertension, hypothyroidism, retinopathy, and more presents the ED with a chief complaint of abnormal labs.  Patient reports she woke up feeling like her normal self, and everybody was telling her that she needed to go to the ER.  Apparently patient had recently been admitted for hepatic encephalopathy.  She was discharged on October 22.  She had a follow-up appointment with GI where they drew routine labs.  They called her and told her that she needed to come into the ER due to her BUN and creatinine.  Patient reports she is in no pain at the time of my exam.  She reports she has been having diarrhea but that is normal for her.  I would expect her to have diarrhea because she is on lactulose.  She denies any dizziness, chest pain, palpitations, shortness of breath.  Patient reports she is not sure why she is here.  On chart review patient recently discharged after a 9-day stay in the hospital.  She was found to have hepatic encephalopathy at that time.  She had no history of hepatic encephalopathy.  Her ammonia was as high as 130.  She also appeared to be dehydrated.  Her symptoms improved with a lactulose enema.  She completed IV ceftriaxone x 5 days for spontaneous bacterial peritonitis.  Her ammonia was down to 47 at discharge.  She did have a paracentesis on October 14 with 5.5 L of cloudy ascitic fluid removed.  Patient was sent home on lactulose to be titrated to achieve 2-3 mushy stools per day per discharge summary.  Patient had an acute kidney injury at that admission as well.  Her creatinine peaked at 2.77 at that time.  She was discharged on ciprofloxacin from that admission.  Patient has no other complaints at  this time.   Clinical Impression  Patient required HOB raised during supine to sitting, had to raise bed for completing sit to stands, unsafe to ambulate away from bedside due to weakness, fatigue with SpO2 dropping below 90% to 85% while on room air.  Patient put back on 3 LPM after therapy and tolerated sitting up in chair.  Patient will benefit from continued skilled physical therapy in hospital and recommended venue below to increase strength, balance, endurance for safe ADLs and gait.       If plan is discharge home, recommend the following: A lot of help with bathing/dressing/bathroom;Help with stairs or ramp for entrance;Assistance with cooking/housework;A lot of help with walking and/or transfers   Can travel by private vehicle   No    Equipment Recommendations None recommended by PT  Recommendations for Other Services       Functional Status Assessment Patient has had a recent decline in their functional status and demonstrates the ability to make significant improvements in function in a reasonable and predictable amount of time.     Precautions / Restrictions Precautions Precautions: Fall Restrictions Weight Bearing Restrictions: No      Mobility  Bed Mobility Overal bed mobility: Needs Assistance Bed Mobility: Supine to Sit     Supine to sit: Min assist, Contact guard, HOB elevated     General bed mobility comments: increased time, labored movement with HOB partially raised    Transfers Overall transfer level:  Needs assistance Equipment used: Rolling walker (2 wheels) Transfers: Sit to/from Stand, Bed to chair/wheelchair/BSC Sit to Stand: Min assist, Mod assist   Step pivot transfers: Min assist, Mod assist       General transfer comment: has difficulty completing sit to stands from chair, required bed raised for completing sit to stands    Ambulation/Gait Ambulation/Gait assistance: Min assist, Mod assist Gait Distance (Feet): 10 Feet Assistive  device: Rolling walker (2 wheels) Gait Pattern/deviations: Decreased step length - right, Decreased step length - left, Decreased stride length Gait velocity: slow     General Gait Details: limited to a few side steps at bedside due to c/o fatigue and generalized weakness  Stairs            Wheelchair Mobility     Tilt Bed    Modified Rankin (Stroke Patients Only)       Balance Overall balance assessment: Needs assistance Sitting-balance support: Feet supported, No upper extremity supported Sitting balance-Leahy Scale: Fair Sitting balance - Comments: fair/good seated at EOB   Standing balance support: Reliant on assistive device for balance, During functional activity, Bilateral upper extremity supported Standing balance-Leahy Scale: Poor Standing balance comment: fair/poor using RW                             Pertinent Vitals/Pain Pain Assessment Pain Assessment: No/denies pain    Home Living Family/patient expects to be discharged to:: Private residence Living Arrangements: Children Available Help at Discharge: Family;Available PRN/intermittently Type of Home: Mobile home Home Access: Stairs to enter;Ramped entrance Entrance Stairs-Rails: Right Entrance Stairs-Number of Steps: 2   Home Layout: One level Home Equipment: Agricultural consultant (2 wheels);Rollator (4 wheels);BSC/3in1;Shower seat      Prior Function Prior Level of Function : Needs assist       Physical Assist : Mobility (physical);ADLs (physical) Mobility (physical): Bed mobility;Transfers;Gait;Stairs   Mobility Comments: household and short distanced Orthoptist ADLs Comments: Assisted by family     Extremity/Trunk Assessment   Upper Extremity Assessment Upper Extremity Assessment: Generalized weakness    Lower Extremity Assessment Lower Extremity Assessment: Generalized weakness    Cervical / Trunk Assessment Cervical / Trunk Assessment: Kyphotic   Communication   Communication Communication: Hearing impairment Cueing Techniques: Verbal cues;Tactile cues  Cognition Arousal: Alert Behavior During Therapy: WFL for tasks assessed/performed Overall Cognitive Status: Within Functional Limits for tasks assessed                                          General Comments      Exercises     Assessment/Plan    PT Assessment Patient needs continued PT services  PT Problem List Decreased strength;Decreased activity tolerance;Decreased balance;Decreased mobility       PT Treatment Interventions DME instruction;Gait training;Stair training;Functional mobility training;Therapeutic activities;Therapeutic exercise;Balance training;Patient/family education    PT Goals (Current goals can be found in the Care Plan section)  Acute Rehab PT Goals Patient Stated Goal: return home with family to assist PT Goal Formulation: With patient Time For Goal Achievement: 02/22/23 Potential to Achieve Goals: Good    Frequency Min 3X/week     Co-evaluation               AM-PAC PT "6 Clicks" Mobility  Outcome Measure Help needed turning from your back to your side while in  a flat bed without using bedrails?: A Little Help needed moving from lying on your back to sitting on the side of a flat bed without using bedrails?: A Little Help needed moving to and from a bed to a chair (including a wheelchair)?: A Lot Help needed standing up from a chair using your arms (e.g., wheelchair or bedside chair)?: A Lot Help needed to walk in hospital room?: A Lot Help needed climbing 3-5 steps with a railing? : A Lot 6 Click Score: 14    End of Session Equipment Utilized During Treatment: Oxygen Activity Tolerance: Patient tolerated treatment well;Patient limited by fatigue;Patient limited by lethargy Patient left: in chair;with call bell/phone within reach Nurse Communication: Mobility status PT Visit Diagnosis: Unsteadiness on feet  (R26.81);Other abnormalities of gait and mobility (R26.89);Muscle weakness (generalized) (M62.81)    Time: 6962-9528 PT Time Calculation (min) (ACUTE ONLY): 30 min   Charges:   PT Evaluation $PT Eval Moderate Complexity: 1 Mod PT Treatments $Therapeutic Activity: 23-37 mins PT General Charges $$ ACUTE PT VISIT: 1 Visit         12:21 PM, 02/08/23 Ocie Bob, MPT Physical Therapist with [facility] 336 [phone] office [phone] mobile phone

## 2023-02-08 NOTE — Progress Notes (Signed)
[facility]  Initially consulted for possible dialysis line.  Reviewed chart and discussed with partners. Patient very high risk given hepatorenal syndrome, platelets 20s, INR 2.1. Plan for dialysis to start tomorrow per Dr. Thedore Mins.   Have discussed with hospitalist, nephrology, GI, and I think given her multiorgan, complex situation she would best be served at an institute with more resources.  Discussed that a TDC would not be possible at [facility]. If she needed a dialysis line today, I would be placing a femoral line given high risk of bleeding and hematoma with an internal jugular, potential respiratory compromise.    After discussion, the team has decided she will get transferred to [facility] for further management. Dr. Levon Hedger, GI has spoken to [facility] about her potential for transplant but that appears unlikely given her functional status. Will hold on dialysis line placement for now given transfer.   Algis Greenhouse, MD [facility] [address]ella [address] [place], [place] [address] [phone] (office)

## 2023-02-08 NOTE — Progress Notes (Addendum)
Patient ID: [name], female   DOB: 06-Dec-1950, 72 y.o.   MRN: [id number] [facility] Progress Note   Assessment/ Plan:   1. Acute kidney Injury on chronic kidney disease stage IIIb: Baseline creatinine has been around 2.0-2.2.  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   -kidney function continues to worsen despite treatment for HRS (on levophed and octreotide). Given process azotemia and declining urine output, then plan to start dialysis (has been discussed with her last Friday, yesterday, and today). Appreciate surgery's assistance for access placement. Her platelet count may be a limiting factor for [facility] therefore may just need to start via a temporary catheter. Tentatively planning on starting HD#1 (slow start protocol) by tomorrow once access in place  2.  NASH cirrhosis: Ongoing close management by gastroenterology and has been started on ciprofloxacin for SBP prophylaxis along with lactulose to avoid hepatic encephalopathy.  Current plan is to assess functional status for transplant candidacy--PT eval pending 3.  Anemia/thrombocytopenia: Thrombocytopenia is likely related to her cirrhosis/portal hypertension and splenic sequestration with anemia secondary to chronic disease including CKD/cirrhosis.  No indication at this time for PRBC transfusion and she remains on folic acid supplementation.  No overt bleeding noted. Transfuse prn for hgb <7. Checking Fe panel 4.  Metabolic acidosis: secondary to AKI and cirrhosis. Continue oral sodium bicarbonate with plans to discontinue this when dialysis is started.  Updated patient's son [name] over the phone this morning.  Addendum: informed that patient will be moved to [facility] given low ICU bed availability. Will plan on starting CRRT tomorrow and once access has been secured. Informed covering nephrologist.   Anthony Sar,  MD [facility]  Subjective:   Patient seen and examined bedside. Patient reports feeling sleepy, didn't sleep well last night. Does report ongoing abdominal distension, makes it hard for her to move around. +low appetite. Also reports decreasing urine output   Objective:   BP (!) 139/52   Pulse (!) 56   Temp 97.8 F (36.6 C) (Oral)   Resp 16   Ht 5\' 6"  (1.676 m)   Wt 100.9 kg   SpO2 99%   BMI 35.90 kg/m   Intake/Output Summary (Last 24 hours) at 02/08/2023 0744 Last data filed at 02/07/2023 1652 Gross per 24 hour  Intake 383.55 ml  Output --  Net 383.55 ml   Weight change:   Physical Exam: BP (!) 139/52   Pulse (!) 56   Temp 97.8 F (36.6 C) (Oral)   Resp 16   Ht 5\' 6"  (1.676 m)   Wt 100.9 kg   SpO2 99%   BMI 35.90 kg/m   Gen: NAD, tired appearing CVS: S1S2, RRR, Right IJ port in place Resp: normal wob, unlabored, decreased breath sounds bibasilar Abd: Soft, distended, nontender Ext: 2+ pitting edema b/l LEs Neuro: awake, alert  Imaging: No results found.  Labs: BMET Recent Labs  Lab 02/02/23 1227 02/03/23 0323 02/04/23 0416 02/05/23 0554 02/06/23 0510 02/07/23 0335 02/08/23 0353  NA 137 137 137 136 134* 136 136  K 4.0 4.3 4.3 4.0 3.9 4.2 4.1  CL 104 104 103 105 103 103 104  CO2 20* 18* 18* 17* 18* 18* 16*  GLUCOSE 95 110* 128* 158* 175* 113* 202*  BUN 98* 89* 91* 86* 90* 94* 99*  CREATININE 8.55* 8.22* 8.45* 8.14* 8.39* 8.95* 9.04*  CALCIUM 7.5* 7.2* 7.4* 7.0*  7.2* 7.8* 7.7*  PHOS  --   --  9.3* 8.6* 8.9* 9.4*  --    CBC Recent Labs  Lab 02/01/23 1234 02/02/23 1227 02/02/23 1227 02/03/23 0323 02/04/23 0416 02/05/23 0554 02/05/23 2015 02/06/23 0510 02/07/23 0335 02/08/23 0353  WBC 4.9 4.6   < > 4.1   < > 5.5  --  5.5 5.4 6.4  NEUTROABS 3.3 3.1  --  2.9  --  4.0  --   --   --   --   HGB 8.7* 8.7*   < > 8.3*   < > 7.3* 8.4* 7.7* 8.3* 8.1*  HCT 26.2* 26.8*   < > 26.2*   < > 22.1* 25.2* 23.7* 25.1* 24.9*  MCV 101*  101.5*   < > 101.9*   < > 100.5*  --  99.6 99.6 100.4*  PLT 43* DCLMP   < > 26*   < > 27*  --  21* 23* 23*   < > = values in this interval not displayed.    Medications:     Chlorhexidine Gluconate Cloth  6 each Topical Daily   ciprofloxacin  250 mg Oral Q breakfast   folic acid  1 mg Oral Daily   insulin aspart  0-15 Units Subcutaneous TID WC   insulin detemir  6 Units Subcutaneous QHS   lactulose  10 g Oral BID   levothyroxine  125 mcg Oral Q0600   lidocaine  1 patch Transdermal Q24H   liver oil-zinc oxide   Topical TID   nystatin  1 Application Topical TID   sevelamer carbonate  800 mg Oral TID WC   sodium bicarbonate  1,300 mg Oral BID    Anthony Sar, MD [facility] 02/08/2023, 7:44 AM

## 2023-02-08 NOTE — Progress Notes (Signed)
eLink Physician-Brief Progress Note Patient Name: [name] DOB: 04-Sep-1950 MRN: [id number]   Date of Service  02/08/2023  HPI/Events of Note  72 y.o. female with medical history significant of anemia, CKD, is mellitus type II, depression, GERD, hyperlipidemia, hypertension, anemia of CKD, hypothyroidism, retinopathy  admitted to [facility]  for Decompensated NASH-liver cirrhosis, s/p paracentesis, HRS, agreeing for liver transplant and for HD, so was transferred to [facility]. Planned HD in AM per notes.  Camera: On nasal o2, resting. VS stable. Sats 97%. SBP 134, HR 89. On levo at 3 mcg, octreotide drip.  Data: Reviewed all labs so far from AM; Last potassium at 4.1  Cr 9 Albumin at 3.8 TB 1.8 Hg at 8.1, platelets 23 K. INR 2.1    eICU Interventions  CCM ground team is at bed side per RN discussion, going to place HD cath.       Intervention Category Major Interventions: Other:;Acute renal failure - evaluation and management;Hypotension - evaluation and management Evaluation Type: New Patient Evaluation  Ranee Gosselin 02/08/2023, 7:48 PM

## 2023-02-08 NOTE — H&P (Signed)
NAME:  [name], MRN:  [id number], DOB:  Aug 03, 1950, LOS: 6 ADMISSION DATE:  02/02/2023, CONSULTATION DATE:  02/08/23 REFERRING MD:  [place] [facility] CHIEF COMPLAINT:  Multiorgan failure   History of Present Illness:  [name] is a 72 y.o. female with NASH cirrhosis, CKD-4, DM2, HTN, HLD, GERD, anemia of chronic illness, hypothyroidism, depression, and chronic thrombocytopenia.   She was admitted to [facility] 02/02/23 after she had abnormal labs at GI clinic (Cr 7.26 and worsening metabolic acidosis). She had a previous recent admission for hepatic encephalopathy and AKI 3 weeks ago, and was at routine GI outpatient follow up. She has multiple paracenteses, last one 02/01/2023.  She was found to have AoCKD with concern for hepatorenal syndrome, hypotension, UTI and was subsequently admitted for further workup. Finished the Rx for UTI.  Unfortunately despite appropriate supportive treatment for HRS with Albumin, IVF, Octreotide and Norepinephrine, her renal function continued to worsen along with ongoing anuria. It was felt that she should be transferred to [facility] or [facility] for initiation of CRRT vs HD. She has been seen by GI at [facility] who recommends Octreotide, Levophed, Albumin, Cipro and possible repeat paracentesis if she accumulates more fluid (she had a para on prior admit and was treated with CTX for SBP). On afternoon of 11/11, she was transferred to [facility] for ongoing management to include CRRT vs HD.  Pertinent  Medical History:  has Acquired hypothyroidism; Type 2 diabetes mellitus with diabetic chronic kidney disease (HCC); Hereditary and idiopathic peripheral neuropathy; Pseudophakia of both eyes; GERD; RENAL DISEASE, CHRONIC, STAGE II; Tremor; Hypertension with renal disease; Idiopathic chronic gout of foot without tophus; CKD stage 3 due to type 2 diabetes mellitus (HCC); Liver cirrhosis secondary to NASH (HCC); Bilateral nondiabetic proliferative retinopathy; Diabetic macular edema (HCC); Hypertensive  retinopathy of both eyes; Vitreous hemorrhage of left eye (HCC); Severe Thrombocytopenia; Prolonged QT interval; Vaginal prolapse; Elevated brain natriuretic peptide (BNP) level; Hypoalbuminemia due to protein-calorie malnutrition (HCC); Acute kidney injury superimposed on stage 4 chronic kidney disease (HCC); Cirrhosis, non-alcoholic (HCC); Vitamin D deficiency; Mixed hyperlipidemia; Gout; History of thrombocytopenia; Obesity; Proliferative diabetic retinopathy (HCC); Complete prolapse of vaginal vault; Urinary retention; Vaginal pessary present; Anemia in stage 4 chronic kidney disease (HCC); Acute hepatic encephalopathy (HCC); Ascites; SBP (spontaneous bacterial peritonitis) (HCC); Encephalopathy, hepatic (HCC); Pressure injury of skin; Hepatic encephalopathy syndrome (HCC); Esophageal varices (HCC); AKI (acute kidney injury) (HCC); Anemia due to chronic kidney disease; UTI (urinary tract infection); Hypotension; Hepatorenal syndrome (HCC); and Poor appetite on their problem list.   Significant Hospital Events: Including procedures, antibiotic start and stop dates in addition to other pertinent events   On Levophed 3 mcg/min and Octreotide 50 mcg/hr drips  Interim History / Subjective:    Objective:  Blood pressure (!) 149/76, pulse 100, temperature 97.7 F (36.5 C), temperature source Oral, resp. rate 14, height 5\' 6"  (1.676 m), weight 100.9 kg, SpO2 97%.        Intake/Output Summary (Last 24 hours) at 02/08/2023 1616 Last data filed at 02/08/2023 1451 Gross per 24 hour  Intake 1479.99 ml  Output --  Net 1479.99 ml   Filed Weights   02/05/23 0600 02/08/23 0221  Weight: 96.9 kg 100.9 kg    Examination: General: alert, oriented x4, and comfortable. On 3.5 L [place]. SpO2 96%. MAP 81 mmHg HENT: PERL, normal pharynx and dry oral mucosa. No LNE or thyromegaly. No JVD Lungs: symmetrical air entry bilaterally. No crackles or wheezing Cardiovascular: NL S1/S2. No m/g/r  Abdomen: no tenderness.  Mild ascites. No rebound or rigidity.  Extremities: +4 edema. Symmetrical  Neuro: nonfocal   Right chest IV access  Assessment & Plan:   AoCKD with development of HRS - worsening despite standard supportive therapies. - Nephrology was notified of the transfer here to [facility]. - Will need CRRT vs HD on 11/12. - Continue Octreotide, NE, Albumin, Cipro - Continue oral bicarb until gets RRT. - Follow BMP.  NASH cirrhosis with hx of hepatic encephalopathy and SBP - team at [facility] has reached out to [facility] to inquire about transfer for transplant evaluation. At current time, [facility] recommends PT eval to assess her functional status as first steps as she would likely meet criteria for simultaneous liver/kidney transplant. - Continue empiric prophylactic Ciprofloxacin per GI recs (pharmacy assistance with renal dosing appreciated). - Continue Lactulose. - Day team 11/12 to please notify GI of transfer here to [facility]. - Follow serial abdominal exams daily to assess for needs for paracentesis. - Follow PT eval after she has some volume etc removed to assess her functional status as part of transplant candidacy workup. - Will need f/u at [facility] if recovers from this.  Hypotension/shock - 2/2 above. Hx HTN, HLD. - Continue NE for goal MAP > 82 mmHg. - Hold PTA Inderal, Torsemide.   Hx Anemia and thrombocytopenia - 2/2 chronic illnesses above. - Transfuse for Hgb < 7. - Transfuse platelets for < 20 or if any bleeding. - Follow CBC.  Hx DM. - Continue SSI, Levemir.  Hx hypothyroidism. - Continue PTA synthroid.  Best practice (evaluated daily):  Diet/type: Regular consistency (see orders) DVT prophylaxis: SCD GI prophylaxis: N/A Lines: Central line Foley:  Yes, and it is still needed Code Status:  full code Last date of multidisciplinary goals of care discussion: None yet.  Labs   CBC: Recent Labs  Lab 02/02/23 1227 02/03/23 0323 02/04/23 0416 02/05/23 0554 02/05/23 2015 02/06/23 0510  02/07/23 0335 02/08/23 0353  WBC 4.6 4.1 5.4 5.5  --  5.5 5.4 6.4  NEUTROABS 3.1 2.9  --  4.0  --   --   --   --   HGB 8.7* 8.3* 7.6* 7.3* 8.4* 7.7* 8.3* 8.1*  HCT 26.8* 26.2* 23.7* 22.1* 25.2* 23.7* 25.1* 24.9*  MCV 101.5* 101.9* 101.7* 100.5*  --  99.6 99.6 100.4*  PLT DCLMP 26* 34* 27*  --  21* 23* 23*    Basic Metabolic Panel: Recent Labs  Lab 02/03/23 0323 02/04/23 0416 02/05/23 0554 02/06/23 0510 02/07/23 0335 02/08/23 0353  NA 137 137 136 134* 136 136  K 4.3 4.3 4.0 3.9 4.2 4.1  CL 104 103 105 103 103 104  CO2 18* 18* 17* 18* 18* 16*  GLUCOSE 110* 128* 158* 175* 113* 202*  BUN 89* 91* 86* 90* 94* 99*  CREATININE 8.22* 8.45* 8.14* 8.39* 8.95* 9.04*  CALCIUM 7.2* 7.4* 7.0* 7.2* 7.8* 7.7*  MG 2.1 2.1  --   --   --   --   PHOS  --  9.3* 8.6* 8.9* 9.4*  --    GFR: Estimated Creatinine Clearance: 6.8 mL/min (A) (by C-G formula based on SCr of 9.04 mg/dL (H)). Recent Labs  Lab 02/02/23 1809 02/02/23 2051 02/03/23 0323 02/05/23 0554 02/06/23 0510 02/07/23 0335 02/08/23 0353  WBC  --   --    < > 5.5 5.5 5.4 6.4  LATICACIDVEN 2.1* 1.8  --   --   --   --   --    < > =  values in this interval not displayed.    Liver Function Tests: Recent Labs  Lab 02/04/23 0416 02/05/23 0554 02/06/23 0510 02/07/23 0335 02/07/23 1047 02/08/23 0353  AST 22 17 13*  --  12* 13*  ALT 16 13 11   --  10 10  ALKPHOS 59 49 46  --  41 41  BILITOT 1.3* 1.2* 1.9*  --  2.0* 1.8*  PROT 5.3* 5.0* 5.1*  --  6.0* 5.5*  ALBUMIN 3.7  3.6 3.6 3.7 4.0 4.9 3.8   No results for input(s): "LIPASE", "AMYLASE" in the last 168 hours. Recent Labs  Lab 02/03/23 0323  AMMONIA 19    ABG    Component Value Date/Time   HCO3 23.6 01/10/2023 1309   TCO2 29 07/16/2022 2341   ACIDBASEDEF 1.2 01/10/2023 1309   O2SAT 35.7 01/10/2023 1309     Coagulation Profile: Recent Labs  Lab 02/04/23 0416 02/05/23 0554 02/06/23 0510 02/07/23 1047 02/08/23 0353  INR 1.7* 1.9* 2.0* 2.2* 2.1*     Cardiac Enzymes: No results for input(s): "CKTOTAL", "CKMB", "CKMBINDEX", "TROPONINI" in the last 168 hours.  HbA1C: Hgb A1c MFr Bld  Date/Time Value Ref Range Status  01/11/2023 04:07 AM 6.7 (H) 4.8 - 5.6 % Final    Comment:    (NOTE) Pre diabetes:          5.7%-6.4%  Diabetes:              >6.4%  Glycemic control for   <7.0% adults with diabetes   08/25/2022 11:54 AM 6.0 (H) 4.8 - 5.6 % Final    Comment:    (NOTE)         Prediabetes: 5.7 - 6.4         Diabetes: >6.4         Glycemic control for adults with diabetes: <7.0     CBG: Recent Labs  Lab 02/07/23 1621 02/07/23 2013 02/08/23 0727 02/08/23 1122 02/08/23 1533  GLUCAP 138* 198* 165* 120* 167*    Review of Systems:   Deferred due to the patient condition (shock)  Past Medical History:  She,  has a past medical history of Alkaline phosphatase elevation, Anemia, Anemia in stage 4 chronic kidney disease (HCC) (10/15/2022), ASD (atrial septal defect), Cataract, Chest pain, Chronic kidney disease, Cirrhosis (HCC), Depression, Diabetes mellitus type II, Family history of adverse reaction to anesthesia, Fatigue, GERD (gastroesophageal reflux disease), Gout, Headache(784.0), Hyperlipidemia, Hypertension, Hypothyroidism, Neuropathy, Osteopenia, Peripheral neuropathy, Presence of pessary, and Retinopathy.   Surgical History:   Past Surgical History:  Procedure Laterality Date   APPENDECTOMY     CATARACT EXTRACTION     CERVICAL SPINE SURGERY  2006   CHOLECYSTECTOMY N/A 04/07/2013   Procedure: LAPAROSCOPIC CHOLECYSTECTOMY WITH INTRAOPERATIVE CHOLANGIOGRAM;  Surgeon: Robyne Askew, MD;  Location: [facility];  Service: General;  Laterality: N/A;   COMBINED HYSTERECTOMY VAGINAL / OOPHORECTOMY / A&P REPAIR  1987   Unilateral oophorectomy, h/o uterine prolapse has right ovary   ERCP N/A 04/06/2013   Procedure: ENDOSCOPIC RETROGRADE CHOLANGIOPANCREATOGRAPHY (ERCP);  Surgeon: Theda Belfast, MD;  Location: [facility];   Service: Endoscopy;  Laterality: N/A;   ESOPHAGEAL BANDING N/A 09/26/2021   Procedure: ESOPHAGEAL BANDING;  Surgeon: Jeani Hawking, MD;  Location: [facility];  Service: Gastroenterology;  Laterality: N/A;   ESOPHAGEAL BANDING N/A 10/31/2021   Procedure: ESOPHAGEAL BANDING;  Surgeon: Jeani Hawking, MD;  Location: [facility];  Service: Gastroenterology;  Laterality: N/A;   ESOPHAGOGASTRODUODENOSCOPY (EGD) WITH PROPOFOL N/A 08/16/2020  Procedure: ESOPHAGOGASTRODUODENOSCOPY (EGD) WITH PROPOFOL;  Surgeon: Jeani Hawking, MD;  Location: [facility];  Service: Endoscopy;  Laterality: N/A;   ESOPHAGOGASTRODUODENOSCOPY (EGD) WITH PROPOFOL N/A 09/26/2021   Procedure: ESOPHAGOGASTRODUODENOSCOPY (EGD) WITH PROPOFOL;  Surgeon: Jeani Hawking, MD;  Location: [facility];  Service: Gastroenterology;  Laterality: N/A;   ESOPHAGOGASTRODUODENOSCOPY (EGD) WITH PROPOFOL N/A 10/31/2021   Procedure: ESOPHAGOGASTRODUODENOSCOPY (EGD) WITH PROPOFOL;  Surgeon: Jeani Hawking, MD;  Location: [facility];  Service: Gastroenterology;  Laterality: N/A;   EUS N/A 03/31/2013   Procedure: UPPER ENDOSCOPIC ULTRASOUND (EUS) LINEAR;  Surgeon: Theda Belfast, MD;  Location: [facility];  Service: Endoscopy;  Laterality: N/A;   REFRACTIVE SURGERY     SPINAL FUSION     c4-c7   TONSILLECTOMY  age 61     Social History:   reports that she has never smoked. She has never used smokeless tobacco. She reports that she does not drink alcohol and does not use drugs.   Family History:  Her family history includes COPD in her mother; Cataracts in her sister; Diabetes in her sister; Insulin resistance in her daughter and son; Lung cancer in her father.   Allergies Allergies  Allergen Reactions   Bactrim [Sulfamethoxazole-Trimethoprim] Nausea And Vomiting and Other (See Comments)    Dizziness   Cipro [Ciprofloxacin Hcl] Other (See Comments)    Severe yeast infection   Codeine Other (See Comments)    Unknown reaction   Cozaar  [Losartan Potassium] Cough   Lipitor [Atorvastatin] Other (See Comments)    Arthralgias Myalgias   Mevacor [Lovastatin] Other (See Comments)    Arthralgias  Myalgias   Pravachol [Pravastatin] Other (See Comments)    Arthralgias  Myalgias    Rosuvastatin Diarrhea and Other (See Comments)    Arthralgias Myalgias   Statins Other (See Comments)    Arthralgias Myalgias   Welchol [Colesevelam] Other (See Comments)    Arthralgias Myalgias    Zestril [Lisinopril] Cough   Zetia [Ezetimibe] Other (See Comments)    Arthralgias Myalgias    Dilaudid [Hydromorphone] Nausea And Vomiting and Other (See Comments)    GI Intolerance      Home Medications  Prior to Admission medications   Medication Sig Start Date End Date Taking? Authorizing Provider  acetaminophen (TYLENOL) 500 MG tablet Take 500 mg by mouth every 6 (six) hours as needed for mild pain (pain score 1-3).   Yes [provider]  ciprofloxacin (CIPRO) 500 MG tablet Take 1 tablet (500 mg total) by mouth daily with breakfast. 01/20/23  Yes Emokpae, Courage, MD  insulin NPH Human (NOVOLIN N RELION) 100 UNIT/ML injection Inject 16 Units into the skin in the morning and at bedtime.   Yes [provider]  insulin regular (NOVOLIN R RELION) 100 units/mL injection Inject 5 Units into the skin 3 (three) times daily before meals.   Yes [provider]  lactose free nutrition (BOOST) LIQD Take 237 mLs by mouth daily.   Yes [provider]  lactulose (CHRONULAC) 10 GM/15ML solution Take 15 mLs (10 g total) by mouth 3 (three) times daily. Titrate to have 2-3 mushy stools per day Patient taking differently: Take 10 g by mouth daily as needed for mild constipation. 01/19/23  Yes Shon Hale, MD  levothyroxine (SYNTHROID) 125 MCG tablet Take 125 mcg by mouth daily before breakfast. 09/03/22  Yes [provider]  nystatin (MYCOSTATIN/NYSTOP) powder Apply 1 Application topically 3 (three) times  daily. Patient taking differently: Apply 1 Application topically daily. 01/19/23  Yes Emokpae, Courage,  MD  propranolol (INDERAL) 20 MG tablet Take 1 tablet (20 mg total) by mouth daily. 01/28/23  Yes Dolores Frame, MD  sodium bicarbonate 650 MG tablet Take 2 tablets (1,300 mg total) by mouth 2 (two) times daily. 09/10/22 09/10/23 Yes Emokpae, Courage, MD  torsemide (DEMADEX) 20 MG tablet Take 1 tablet (20 mg total) by mouth daily. 01/19/23  Yes Emokpae, Courage, MD  Vitamin D, Ergocalciferol, (DRISDOL) 1.25 MG (50000 UT) CAPS capsule Take 50,000 Units by mouth every Wednesday. 11/16/18  Yes [provider]     Critical care time: 55 min   Brendolyn Patty, MD, [facility]

## 2023-02-08 NOTE — Progress Notes (Signed)
PROGRESS NOTE   [name]  YQM:[id number] DOB: [birth year] DOA: 02/02/2023 PCP: Benita Stabile, MD   Chief Complaint  Patient presents with   Abnormal Lab   Level of care: ICU  Brief Admission History:   72 y.o. female with medical history significant of anemia, CKD, is mellitus type II, depression, GERD, hyperlipidemia, hypertension, hypothyroidism, retinopathy, and more presents the ED with a chief complaint of abnormal labs.  Patient reports she woke up feeling like her normal self, and everybody was telling her that she needed to go to the ER.  Apparently patient had recently been admitted for hepatic encephalopathy.  She was discharged on October 22.  She had a follow-up appointment with GI where they drew routine labs.  They called her and told her that she needed to come into the ER due to her BUN and creatinine.  Patient reports she is in no pain at the time of my exam.  She reports she has been having diarrhea but that is normal for her.  I would expect her to have diarrhea because she is on lactulose.  She denies any dizziness, chest pain, palpitations, shortness of breath.  Patient reports she is not sure why she is here.  On chart review patient recently discharged after a 9-day stay in the hospital.  She was found to have hepatic encephalopathy at that time.  She had no history of hepatic encephalopathy.  Her ammonia was as high as 130.  She also appeared to be dehydrated.  Her symptoms improved with a lactulose enema.  She completed IV ceftriaxone x 5 days for spontaneous bacterial peritonitis.  Her ammonia was down to 47 at discharge.  She did have a paracentesis on October 14 with 5.5 L of cloudy ascitic fluid removed.  Patient was sent home on lactulose to be titrated to achieve 2-3 mushy stools per day per discharge summary.  Patient had an acute kidney injury at that admission as well.  Her creatinine peaked at 2.77 at that time.  She was discharged on ciprofloxacin from that  admission.  Patient has no other complaints at this time.   Patient does not smoke and does not drink.  Patient reports she would want to be full code.  Son at bedside reports that she had a DNR in the past, but revoked it.  Patient is full code.     Assessment and Plan:  AKI (acute kidney injury) - Hepatorenal Syndrome  - Creatinine at baseline is around 2 with a GFR 27 - Creatinine greater than 8 with a GFR of 5-7 - concerning for hepatorenal syndrome - nephrology consultation requested. Appreciate Dr. Valentino Nose recommendation and GI consultation  - Continue home bicarb - central line placed on 11/6  - Avoid nephrotoxic agents when possible - follow renal function panel  - no meaningful renal recovery so far, continue to follow - pt discussed with family and would be willing to have hemodialysis and liver transplant if accepted - nephrologist recommends transfer to [facility] to prepare for dialysis.  I have reached to PCCM, Dr. Wynona Neat consulted and accepted patient to PCCM service for transfer to [facility] on pressors.   Intake/Output Summary (Last 24 hours) at 02/08/2023 1232 Last data filed at 02/08/2023 1109 Gross per 24 hour  Intake 1152.63 ml  Output --  Net 1152.63 ml   Hypotension - Blood pressure as low as 85/36 at presentation - Family reports that blood pressure does run low at home - After 3 L  of fluid her systolic was still in the low 643P, requiring pressor support (IV norepinephrine) - Monitor on stepdown ICU - Hold Inderal and torsemide - Nephrology started on IV albumin and IV norepinephrine infusion for goal MAP>70  and central line placed 11/6   UTI (urinary tract infection) - Ruled Out   - UA suspicious for UTI - Urine culture no growth  - Rocephin started in the ED - Continue Rocephin x 3 doses completed - CT abdomen pelvis did not show any obstructive uropathy or calculi present - could be a contaminated specimen given squamous cells  Anemia due to chronic kidney  disease - Likely related to chronic disease and acute renal failure  - Trending - No active bleeding reported  Thrombocytopenia - severe  - History of thrombocytopenia - Platelet count down to 26 - Hold pharmaceutical DVT prophylaxis - Continue to monitor - due to chronic liver disease   Liver cirrhosis secondary to NASH - Reducing lactulose to as needed for mild constipation given patient's hypovolemic status and AKI - CT did redemonstrate cirrhosis and portal venous hypertension as well as ascites - Dr Levon Hedger recommended restarting cipro for SBP prophylaxis renal dosed, spoke with pharm D and orders started 11/9 - ciprofloxacin dosed per pharm D  Type 2 diabetes mellitus with diabetic chronic kidney disease  - Sliding scale coverage (renal sensitive) - Monitoring CBG CBG (last 3)  Recent Labs    02/07/23 2013 02/08/23 0727 02/08/23 1122  GLUCAP 198* 165* 120*   Acquired hypothyroidism - Continue Synthroid  DVT prophylaxis: SCDs Code Status: Full  Family Communication: son at bedside 11/6  Disposition: transfer to [facility] ICU PCCM Service: accepting MD Dr. Wynona Neat    Consultants:  Nephrology  Palliative care  Gastroenterology Procedures:   Antimicrobials:    Subjective: Reports very minimal urination, abdominal distension today    Objective: Vitals:   02/08/23 1000 02/08/23 1100 02/08/23 1123 02/08/23 1215  BP: (!) 113/45 (!) 162/55  (!) 127/52  Pulse: (!) 57 (!) 57  64  Resp: 16 16    Temp:   98.1 F (36.7 C)   TempSrc:   Oral   SpO2: 97% 98%  99%  Weight:      Height:        Intake/Output Summary (Last 24 hours) at 02/08/2023 1232 Last data filed at 02/08/2023 1109 Gross per 24 hour  Intake 1152.63 ml  Output --  Net 1152.63 ml   Filed Weights   02/05/23 0600 02/08/23 0221  Weight: 96.9 kg 100.9 kg   Examination:  General exam: chronically ill appearing female, awake, appears pale, Appears calm and comfortable  Respiratory system: Clear to  auscultation. Respiratory effort normal. Cardiovascular system: normal S1 & S2 heard. No JVD, murmurs, rubs, gallops or clicks. No pedal edema. Gastrointestinal system: Abdomen is mildly distended, soft and nontender. No fluid wave. No masses felt. Normal bowel sounds heard. Central nervous system: Alert and oriented. No focal neurological deficits. Extremities: Symmetric 5 x 5 power. Skin: No rashes, lesions or ulcers. Psychiatry: Judgement and insight appear normal. Mood & affect appropriate.   Data Reviewed: I have personally reviewed following labs and imaging studies  CBC: Recent Labs  Lab 02/01/23 1234 02/02/23 1227 02/02/23 1227 02/03/23 0323 02/04/23 0416 02/05/23 0554 02/05/23 2015 02/06/23 0510 02/07/23 0335 02/08/23 0353  WBC 4.9 4.6   < > 4.1 5.4 5.5  --  5.5 5.4 6.4  NEUTROABS 3.3 3.1  --  2.9  --  4.0  --   --   --   --  HGB 8.7* 8.7*   < > 8.3* 7.6* 7.3* 8.4* 7.7* 8.3* 8.1*  HCT 26.2* 26.8*   < > 26.2* 23.7* 22.1* 25.2* 23.7* 25.1* 24.9*  MCV 101* 101.5*   < > 101.9* 101.7* 100.5*  --  99.6 99.6 100.4*  PLT 43* DCLMP   < > 26* 34* 27*  --  21* 23* 23*   < > = values in this interval not displayed.    Basic Metabolic Panel: Recent Labs  Lab 02/03/23 0323 02/04/23 0416 02/05/23 0554 02/06/23 0510 02/07/23 0335 02/08/23 0353  NA 137 137 136 134* 136 136  K 4.3 4.3 4.0 3.9 4.2 4.1  CL 104 103 105 103 103 104  CO2 18* 18* 17* 18* 18* 16*  GLUCOSE 110* 128* 158* 175* 113* 202*  BUN 89* 91* 86* 90* 94* 99*  CREATININE 8.22* 8.45* 8.14* 8.39* 8.95* 9.04*  CALCIUM 7.2* 7.4* 7.0* 7.2* 7.8* 7.7*  MG 2.1 2.1  --   --   --   --   PHOS  --  9.3* 8.6* 8.9* 9.4*  --     CBG: Recent Labs  Lab 02/07/23 1137 02/07/23 1621 02/07/23 2013 02/08/23 0727 02/08/23 1122  GLUCAP 87 138* 198* 165* 120*    Recent Results (from the past 240 hour(s))  Urine Culture     Status: None   Collection Time: 02/02/23  2:55 PM   Specimen: Urine, Clean Catch  Result Value  Ref Range Status   Specimen Description   Final    URINE, CLEAN CATCH Performed at [facility], [address] [place], [place] [address]    Special Requests   Final    NONE Performed at [facility], [address] [place], [place] [address]    Culture   Final    NO GROWTH Performed at [facility], [address], [place], [place] [address]    Report Status 02/03/2023 FINAL  Final  MRSA Next Gen by PCR, Nasal     Status: None   Collection Time: 02/02/23  8:08 PM   Specimen: Nasal Mucosa; Nasal Swab  Result Value Ref Range Status   MRSA by PCR Next Gen NOT DETECTED NOT DETECTED Final    Comment: (NOTE) The GeneXpert MRSA Assay (FDA approved for NASAL specimens only), is one component of a comprehensive MRSA colonization surveillance program. It is not intended to diagnose MRSA infection nor to guide or monitor treatment for MRSA infections. Test performance is not FDA approved in patients less than 71 years old. Performed at [facility], [address] [place], [place] [address]   Culture, body fluid w Gram Stain-bottle     Status: None (Preliminary result)   Collection Time: 02/05/23 11:55 AM   Specimen: Ascitic  Result Value Ref Range Status   Specimen Description ASCITIC  Final   Special Requests NONE  Final   Culture   Final    NO GROWTH 3 DAYS Performed at [facility], [address] [place], [place] [address]    Report Status PENDING  Incomplete  Gram stain     Status: None   Collection Time: 02/05/23 11:55 AM   Specimen: Ascitic  Result Value Ref Range Status   Specimen Description ASCITIC  Final   Special Requests NONE  Final   Gram Stain   Final    NO ORGANISMS SEEN WBC PRESENT,BOTH PMN AND MONONUCLEAR CYTOSPIN SMEAR Performed at [facility], [address] [place], [place] [address]    Report  Status 02/05/2023 FINAL  Final     Radiology Studies: No results found.  Scheduled Meds:  Chlorhexidine Gluconate Cloth  6 each Topical Daily    Chlorhexidine Gluconate Cloth  6 each Topical Q0600   ciprofloxacin  250 mg Oral Q breakfast   folic acid  1 mg Oral Daily   insulin aspart  0-15 Units Subcutaneous TID WC   insulin detemir  6 Units Subcutaneous QHS   lactulose  10 g Oral BID   levothyroxine  125 mcg Oral Q0600   lidocaine  1 patch Transdermal Q24H   liver oil-zinc oxide   Topical TID   nystatin  1 Application Topical TID   sevelamer carbonate  800 mg Oral TID WC   sodium bicarbonate  1,300 mg Oral BID   Continuous Infusions:  albumin human 60 mL/hr at 02/08/23 1109   norepinephrine (LEVOPHED) Adult infusion 3 mcg/min (02/08/23 1109)   octreotide (SANDOSTATIN) 500 mcg in sodium chloride 0.9 % 250 mL (2 mcg/mL) infusion 50 mcg/hr (02/08/23 1109)    LOS: 6 days   Critical Care Procedure Note Authorized and Performed by: Maryln Manuel MD  Total Critical Care time:  63 mins Due to a high probability of clinically significant, life threatening deterioration, the patient required my highest level of preparedness to intervene emergently and I personally spent this critical care time directly and personally managing the patient.  This critical care time included obtaining a history; examining the patient, pulse oximetry; ordering and review of studies; arranging urgent treatment with development of a management plan; evaluation of patient's response of treatment; frequent reassessment; and discussions with other providers.  This critical care time was performed to assess and manage the high probability of imminent and life threatening deterioration that could result in multi-organ failure.  It was exclusive of separately billable procedures and treating other patients and teaching time.    Standley Dakins, MD How to contact the [facility] Attending or Consulting provider 7A - 7P or covering provider during after hours 7P -7A, for this patient?  Check the care team in [facility] and look for a) attending/consulting TRH provider listed and b) the  [facility] team listed Log into [email or web address] to find provider on call.  Locate the [facility] provider you are looking for under [facility] and page to a number that you can be directly reached. If you still have difficulty reaching the provider, please page the [facility] (Director on Call) for the Hospitalists listed on amion for assistance.  02/08/2023, 12:32 PM

## 2023-02-08 NOTE — Procedures (Signed)
Central Venous Catheter Insertion Procedure Note  JAID DEMEDEIROS  [id number]  1950-12-10  Date:02/08/23  Time:8:49 PM   Provider Performing:Ginni Eichler Allen Norris   Procedure: Insertion of Non-tunneled Central Venous Catheter(36556)with US guidance ([address])    Indication(s) Hemodialysis  Consent Risks of the procedure as well as the alternatives and risks of each were explained to the patient and/or caregiver.  Consent for the procedure was obtained and is signed in the bedside chart  Anesthesia Topical only with 1% lidocaine   Timeout Verified patient identification, verified procedure, site/side was marked, verified correct patient position, special equipment/implants available, medications/allergies/relevant history reviewed, required imaging and test results available.  Sterile Technique Maximal sterile technique including full sterile barrier drape, hand hygiene, sterile gown, sterile gloves, mask, hair covering, sterile ultrasound probe cover (if used).  Procedure Description Area of catheter insertion was cleaned with chlorhexidine and draped in sterile fashion.   With real-time ultrasound guidance a HD catheter was placed into the left internal jugular vein.  Nonpulsatile blood flow and easy flushing noted in all ports.  The catheter was sutured in place and sterile dressing applied.  Complications/Tolerance None; patient tolerated the procedure well. Chest X-ray is ordered to verify placement for internal jugular or subclavian cannulation.  Chest x-ray is not ordered for femoral cannulation.  EBL Minimal  Specimen(s) None

## 2023-02-08 NOTE — Progress Notes (Addendum)
Subjective: Reports she is tired. Abdomen starting to get a little firm, but not causing any discomfort at this time. No confusion/mental status changes. Reports 2-3 loose Bms daily. No brbpr or melena per nursing. No other complaints. States she thinks she will be starting dialysis soon. She is not making urine. Discussed the need for PT evaluation prior to considering liver transplant.   Objective: Vital signs in last 24 hours: Temp:  [97.8 F (36.6 C)-98.3 F (36.8 C)] 97.8 F (36.6 C) (11/11 0720) Pulse Rate:  [56-91] 56 (11/11 0700) Resp:  [10-24] 16 (11/11 0700) BP: (97-151)/(34-122) 139/52 (11/11 0700) SpO2:  [94 %-100 %] 99 % (11/11 0700) Weight:  [100.9 kg] 100.9 kg (11/11 0221) Last BM Date : 02/07/23 General:   Alert and oriented, pleasant, frail, chronically ill appearing, [place] in place.  Head:  Normocephalic and atraumatic. Eyes:  No icterus, sclera clear. Conjuctiva pink.  Abdomen:  Bowel sounds present, full, slightly firm in the upper abdomen. Non-tender. No rebound or guarding.  Msk:  Symmetrical without gross deformities. Normal posture. Extremities:  With 1-2 + LE edema.  Neurologic:  Alert and  oriented x4;  grossly normal neurologically. Skin:  Warm and dry, intact without significant lesions.  Psych: Normal mood and affect.  Intake/Output from previous day: 11/10 0701 - 11/11 0700 In: 383.6 [I.V.:223.1; IV Piggyback:160.4] Out: -  Intake/Output this shift: No intake/output data recorded.  Lab Results: Recent Labs    02/06/23 0510 02/07/23 0335 02/08/23 0353  WBC 5.5 5.4 6.4  HGB 7.7* 8.3* 8.1*  HCT 23.7* 25.1* 24.9*  PLT 21* 23* 23*   BMET Recent Labs    02/06/23 0510 02/07/23 0335 02/08/23 0353  NA 134* 136 136  K 3.9 4.2 4.1  CL 103 103 104  CO2 18* 18* 16*  GLUCOSE 175* 113* 202*  BUN 90* 94* 99*  CREATININE 8.39* 8.95* 9.04*  CALCIUM 7.2* 7.8* 7.7*   LFT Recent Labs    02/06/23 0510 02/07/23 0335 02/07/23 1047 02/08/23 0353   PROT 5.1*  --  6.0* 5.5*  ALBUMIN 3.7 4.0 4.9 3.8  AST 13*  --  12* 13*  ALT 11  --  10 10  ALKPHOS 46  --  41 41  BILITOT 1.9*  --  2.0* 1.8*  BILIDIR  --   --  0.7*  --   IBILI  --   --  1.3*  --    PT/INR Recent Labs    02/07/23 1047 02/08/23 0353  LABPROT 24.9* 23.8*  INR 2.2* 2.1*     Assessment: 72 y.o. female with history of NASH cirrhosis, esophageal varices s/p banding, ascites requiring frequent paracentesis, SBP, GERD, diabetes mellitus, hypertension, HLD, CKD, chronic anemia, who was advised to come to the hospital after presenting severe AKI on routine blood workup as outpatient.    Acute on chronic kidney disease:  Baseline Cr prior to admission was around 2. On admission (11/4), creatinine 7.26.  Creatinine has been slowly increasing this admission, up to 9.04 today, despite albumin, levophed, and octreotide). 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 Nephrology is planning to start dialysis, hopefully by tomorrow, once she gets access placed.   Dr. Levon Hedger discussed the case with on-call physician with transplant hepatology at [facility] yesterday who stated main limitation may be functionality of the patient and she would need to have full PT evaluation of mobility and frailty.PT evaluation is pending today.   NASH Cirrhosis:  MELD 3.0 is 30 today, largely  driven by worsening creatinine in the setting of HRS. INR also elevated at 2.1. No HE at this time, currently on lactulose. She does have increasing edema and ascites, but diuretics on hold due to kidney failure. Also holding on para for now in light of worsening renal function as she is not in any discomfort, but will likely need to consider paracentesis in the next few days.   Patient/family would like to consider liver transplant. Dr. Levon Hedger discussed with [facility] hepatology who has recommended PT evaluation first to assess functionality as this may be the main limitation. Palliative has also been consulted to  discuss goals of care.   History of SBP: Admitted in October with SBP. Diagnostic tap on 11/8 yielding 100 ml, negative for SBP. Currently on ciprofloxacin 250 mg daily, renally adjusted per pharmacy.    Plan: Management of HRS per nephrology.  Currently on albumin, octreotide drip, and Levophed drip. Continue ciprofloxacin 250 mg, renally dosed. Continue lactulose titrated for 2-3 Bms daily.  PT evaluation today to help determine candidacy for possible liver transplant evaluation.  Continue to hold diuretics.  Hold on paracentesis for now.  CBC, CMP, INR daily.  Daily folic acid supplementation.     LOS: 6 days    02/08/2023, 8:07 AM   Ermalinda Memos, [facility]

## 2023-02-08 NOTE — Telephone Encounter (Signed)
Per Dr. Levon Hedger, please cancel upcoming procedure given everything going on. Message sent to endo making aware.

## 2023-02-08 NOTE — TOC Progression Note (Signed)
Transition of Care [facility]) - Progression Note    Patient Details  Name: [name] MRN: [id number] Date of Birth: 04-19-1950  [facility] CM/SW Contact  [place], [place] Phone Number: 02/08/2023, 11:25 AM  Clinical Narrative:    CSW updated that PT is recommending SNF. CSW spoke with pts son who states depending on medical treatment they will likely be interested in SNF. CSW updated that pt may be transferred to [facility]. TOC to follow.   Expected Discharge Plan: Home w Home Health Services Barriers to Discharge: No Barriers Identified  Expected Discharge Plan and Services In-house Referral: Clinical Social Work Discharge Planning Services: CM Consult Post Acute Care Choice: Home Health Living arrangements for the past 2 months: Single Family Home                                       Social Determinants of Health (SDOH) Interventions SDOH Screenings   Food Insecurity: No Food Insecurity (02/06/2023)  Housing: Low Risk  (02/06/2023)  Transportation Needs: No Transportation Needs (02/06/2023)  Utilities: Not At Risk (02/06/2023)  Alcohol Screen: Low Risk  (10/30/2019)  Depression (PHQ2-9): Medium Risk (10/30/2019)  Financial Resource Strain: Medium Risk (10/30/2019)  Physical Activity: Inactive (10/30/2019)  Social Connections: Moderately Integrated (10/30/2019)  Stress: No Stress Concern Present (10/30/2019)  Tobacco Use: Low Risk  (02/02/2023)    Readmission Risk Interventions    02/03/2023    2:33 PM 01/11/2023    8:51 AM 09/04/2022   12:38 PM  Readmission Risk Prevention Plan  Transportation Screening Complete Complete Complete  HRI or Home Care Consult Complete Complete Complete  Social Work Consult for Recovery Care Planning/Counseling Complete Complete Complete  Palliative Care Screening Not Applicable Not Applicable Complete  Medication Review Oceanographer) Complete Complete Complete

## 2023-02-08 NOTE — Progress Notes (Signed)
Called report to [facility] ICU RN.  Called patient's son to notify of pending transfer to [facility] 1224.

## 2023-02-08 NOTE — Consult Note (Signed)
Consultation Note Date: 02/08/2023   Patient Name: [name]  DOB: [birth year]  MRN: [id number]  Age / Sex: 72 y.o., female  PCP: Benita Stabile, MD Referring Physician: Cleora Fleet, MD  Reason for Consultation: Establishing goals of care  HPI/Patient Profile: 72 y.o. female  with past medical history of [name] cirrhosis, GERD, anemia, CKD 3, DM2, HTN/HLD, hypothyroid, retinopathy, depression, recently admitted for hepatic encephalopathy and discharged October 22, gout, chronic vaginal/bladder prolapse with pessary admitted on 02/02/2023 with acute kidney injury/hepatorenal syndrome with a creatinine greater than 8.   Clinical Assessment and Goals of Care: I have reviewed medical records including EPIC notes, labs and imaging, received report from RN, assessed the patient.  [name] is sitting up in the [place] chair in her room.  She appears acutely/chronically ill and quite frail.  She is alert, making and mostly keeping eye contact.  She is oriented x 3, able to make her needs known.  There is no family at bedside at this time.  We meet at the bedside to discuss diagnosis prognosis, GOC, EOL wishes, disposition and options.  I introduced Palliative Medicine as specialized medical care for people living with serious illness. It focuses on providing relief from the symptoms and stress of a serious illness. The goal is to improve quality of life for both the patient and the family.  [name] was seen by this palliative nurse practitioner in June of this year.  We then focused on their current illness.  Overall, [name] is knowledgeable about her acute health concerns.  She shares that she understands she is to be evaluated by [facility] for possible liver transplant.  She tells me that she feels very weak at this time.  The natural disease trajectory and expectations at EOL were discussed.  Advanced directives,  concepts specific to code status, artifical feeding and hydration, and rehospitalization were considered and discussed.  During our June visit, [name] readily endorsed DNR.  She tells me that she has changed her mind since our conversation and would like attempted resuscitation.  I encouraged her to consider how long she would accept life support.  Discussed the importance of continued conversation with family and the medical providers regarding overall plan of care and treatment options, ensuring decisions are within the context of the patient's values and GOCs.  Questions and concerns were addressed.  The patient was encouraged to call with questions or concerns.  PMT will continue to support holistically.  Conference with attending, general surgery, nephrology, bedside nursing staff, transition of care team related to patient condition, needs, goals of care, disposition.    HCPOA  HCPOA -[name] states that she has made her daughter, [name], her healthcare power of attorney and her son [name] and her financial power of attorney.  She then tells me that she prefers that we speak to [place].    SUMMARY OF RECOMMENDATIONS   At this point full scope/full code Hopeful to go to [facility] for liver transplant evaluation   Code  Status/Advance Care Planning: Full code -verified with patient.  [name] endorses that she changed her mind after her hospital stay in June where she elected DNR.  Symptom Management:  Per hospitalist, no additional needs at this time.  Palliative Prophylaxis:  Frequent Pain Assessment and Oral Care  Additional Recommendations (Limitations, Scope, Preferences): Full Scope Treatment  Psycho-social/Spiritual:  Desire for further Chaplaincy support:no Additional Recommendations: Caregiving  Support/Resources and ICU Family Guide  Prognosis:  Unable to determine, based on outcomes.  Guarded at this point.  Discharge Planning: To Be Determined      Primary  Diagnoses: Present on Admission:  AKI (acute kidney injury) (HCC)  Anemia due to chronic kidney disease  UTI (urinary tract infection)  Hypotension  Liver cirrhosis secondary to NASH (HCC)  Severe Thrombocytopenia  Type 2 diabetes mellitus with diabetic chronic kidney disease (HCC)  Acquired hypothyroidism  Hepatorenal syndrome (HCC)  Poor appetite   I have reviewed the medical record, interviewed the patient and family, and examined the patient. The following aspects are pertinent.  Past Medical History:  Diagnosis Date   Alkaline phosphatase elevation    Anemia    Anemia in stage 4 chronic kidney disease (HCC) 10/15/2022   ASD (atrial septal defect)    Cataract    both eyes hx of   Chest pain    03-17-2013 last chest pain   Chronic kidney disease    Stage III kidney disease   Cirrhosis (HCC)    Depression    Diabetes mellitus type II    Family history of adverse reaction to anesthesia    Son hard to wake up   Fatigue    GERD (gastroesophageal reflux disease)    Gout    Headache(784.0)    occasional   Hyperlipidemia    Hypertension    Hypothyroidism    Neuropathy    Osteopenia    Peripheral neuropathy    Presence of pessary    Retinopathy    Social History   Socioeconomic History   Marital status: Divorced    Spouse name: Not on file   Number of children: 3   Years of education: 12   Highest education level: Not on file  Occupational History   Occupation: [employer]-Retired    Associate Professor: [employer]  Tobacco Use   Smoking status: Never   Smokeless tobacco: Never  Vaping Use   Vaping status: Never Used  Substance and Sexual Activity   Alcohol use: No   Drug use: No   Sexual activity: Not Currently    Birth control/protection: Surgical    Comment: hyst  Other Topics Concern   Not on file  Social History Narrative   Divorced   Lives alone. Reports that her son recently moved out after living with her for a long time. She reports that she is  happy to be living alone and feels like she was enabling his behavior. Reports that he had a history of drug use.   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    No pets.   Eats all food groups.    Wears seat belt.    Lives in house.    Smoke detectors.    Social Determinants of Health   Financial Resource Strain: Medium Risk (10/30/2019)   Overall Financial Resource Strain (CARDIA)    Difficulty of Paying Living Expenses: Somewhat hard  Food Insecurity: No Food Insecurity (  02/06/2023)   Hunger Vital Sign    Worried About Running Out of Food in the Last Year: Never true    Ran Out of Food in the Last Year: Never true  Transportation Needs: No Transportation Needs (02/06/2023)   PRAPARE - Administrator, Civil Service (Medical): No    Lack of Transportation (Non-Medical): No  Physical Activity: Inactive (10/30/2019)   Exercise Vital Sign    Days of Exercise per Week: 0 days    Minutes of Exercise per Session: 0 min  Stress: No Stress Concern Present (10/30/2019)   [employer] of Occupational Health - Occupational Stress Questionnaire    Feeling of Stress : Not at all  Social Connections: Moderately Integrated (10/30/2019)   Social Connection and Isolation Panel [NHANES]    Frequency of Communication with Friends and Family: More than three times a week    Frequency of Social Gatherings with Friends and Family: Three times a week    Attends Religious Services: More than 4 times per year    Active Member of Clubs or Organizations: Yes    Attends Banker Meetings: 1 to 4 times per year    Marital Status: Divorced   Family History  Problem Relation Age of Onset   COPD Mother    Lung cancer Father    Diabetes Sister    Cataracts Sister    Insulin resistance Daughter    Insulin resistance Son    Scheduled Meds:  Chlorhexidine Gluconate Cloth  6 each Topical Daily   Chlorhexidine Gluconate Cloth  6  each Topical Q0600   ciprofloxacin  250 mg Oral Q breakfast   folic acid  1 mg Oral Daily   insulin aspart  0-15 Units Subcutaneous TID WC   insulin detemir  6 Units Subcutaneous QHS   lactulose  10 g Oral BID   levothyroxine  125 mcg Oral Q0600   lidocaine  1 patch Transdermal Q24H   liver oil-zinc oxide   Topical TID   nystatin  1 Application Topical TID   sevelamer carbonate  800 mg Oral TID WC   sodium bicarbonate  1,300 mg Oral BID   Continuous Infusions:  albumin human 60 mL/hr at 02/08/23 1109   norepinephrine (LEVOPHED) Adult infusion 3 mcg/min (02/08/23 1109)   octreotide (SANDOSTATIN) 500 mcg in sodium chloride 0.9 % 250 mL (2 mcg/mL) infusion 50 mcg/hr (02/08/23 1109)   PRN Meds:.acetaminophen **OR** acetaminophen, nystatin, ondansetron **OR** ondansetron (ZOFRAN) IV, oxyCODONE Medications Prior to Admission:  Prior to Admission medications   Medication Sig Start Date End Date Taking? Authorizing Provider  acetaminophen (TYLENOL) 500 MG tablet Take 500 mg by mouth every 6 (six) hours as needed for mild pain (pain score 1-3).   Yes [provider]  ciprofloxacin (CIPRO) 500 MG tablet Take 1 tablet (500 mg total) by mouth daily with breakfast. 01/20/23  Yes Emokpae, Courage, MD  insulin NPH Human (NOVOLIN N RELION) 100 UNIT/ML injection Inject 16 Units into the skin in the morning and at bedtime.   Yes [provider]  insulin regular (NOVOLIN R RELION) 100 units/mL injection Inject 5 Units into the skin 3 (three) times daily before meals.   Yes [provider]  lactose free nutrition (BOOST) LIQD Take 237 mLs by mouth daily.   Yes [provider]  lactulose (CHRONULAC) 10 GM/15ML solution Take 15 mLs (10 g total) by mouth 3 (three) times daily. Titrate to have 2-3 mushy stools per day Patient taking differently:  Take 10 g by mouth daily as needed for mild constipation. 01/19/23  Yes Shon Hale, MD  levothyroxine (SYNTHROID) 125 MCG  tablet Take 125 mcg by mouth daily before breakfast. 09/03/22  Yes [provider]  nystatin (MYCOSTATIN/NYSTOP) powder Apply 1 Application topically 3 (three) times daily. Patient taking differently: Apply 1 Application topically daily. 01/19/23  Yes Emokpae, Courage, MD  propranolol (INDERAL) 20 MG tablet Take 1 tablet (20 mg total) by mouth daily. 01/28/23  Yes Dolores Frame, MD  sodium bicarbonate 650 MG tablet Take 2 tablets (1,300 mg total) by mouth 2 (two) times daily. 09/10/22 09/10/23 Yes Emokpae, Courage, MD  torsemide (DEMADEX) 20 MG tablet Take 1 tablet (20 mg total) by mouth daily. 01/19/23  Yes Emokpae, Courage, MD  Vitamin D, Ergocalciferol, (DRISDOL) 1.25 MG (50000 UT) CAPS capsule Take 50,000 Units by mouth every Wednesday. 11/16/18  Yes [provider]   Allergies  Allergen Reactions   Bactrim [Sulfamethoxazole-Trimethoprim] Nausea And Vomiting and Other (See Comments)    Dizziness   Cipro [Ciprofloxacin Hcl] Other (See Comments)    Severe yeast infection   Codeine Other (See Comments)    Unknown reaction   Cozaar [Losartan Potassium] Cough   Lipitor [Atorvastatin] Other (See Comments)    Arthralgias Myalgias   Mevacor [Lovastatin] Other (See Comments)    Arthralgias  Myalgias   Pravachol [Pravastatin] Other (See Comments)    Arthralgias  Myalgias    Rosuvastatin Diarrhea and Other (See Comments)    Arthralgias Myalgias   Statins Other (See Comments)    Arthralgias Myalgias   Welchol [Colesevelam] Other (See Comments)    Arthralgias Myalgias    Zestril [Lisinopril] Cough   Zetia [Ezetimibe] Other (See Comments)    Arthralgias Myalgias    Dilaudid [Hydromorphone] Nausea And Vomiting and Other (See Comments)    GI Intolerance    Review of Systems  Unable to perform ROS: Acuity of condition    Physical Exam Vitals and nursing note reviewed.  Constitutional:      General: She is not in acute distress.    Appearance: She is  obese. She is ill-appearing.  HENT:     Mouth/Throat:     Mouth: Mucous membranes are dry.  Cardiovascular:     Rate and Rhythm: Normal rate.  Pulmonary:     Effort: Pulmonary effort is normal. No respiratory distress.  Skin:    General: Skin is warm and dry.  Neurological:     Mental Status: She is alert and oriented to person, place, and time.  Psychiatric:        Mood and Affect: Mood normal.        Behavior: Behavior normal.     Vital Signs: BP (!) 127/52   Pulse 64   Temp 98.1 F (36.7 C) (Oral)   Resp 16   Ht 5\' 6"  (1.676 m)   Wt 100.9 kg   SpO2 99%   BMI 35.90 kg/m  Pain Scale: 0-10 POSS *See Group Information*: S-Acceptable,Sleep, easy to arouse Pain Score: Asleep   SpO2: SpO2: 99 % O2 Device:SpO2: 99 % O2 Flow Rate: .O2 Flow Rate (L/min): 2 L/min  IO: Intake/output summary:  Intake/Output Summary (Last 24 hours) at 02/08/2023 1308 Last data filed at 02/08/2023 1109 Gross per 24 hour  Intake 1152.63 ml  Output --  Net 1152.63 ml    LBM: Last BM Date : 02/08/23 Baseline Weight: Weight: 96.9 kg Most recent weight: Weight: 100.9 kg     Palliative Assessment/Data:  Time In: 0930 Time Out: 1025 Time Total: 55 minutes  Greater than 50%  of this time was spent counseling and coordinating care related to the above assessment and plan.  Signed by: Katheran Awe, NP   Please contact Palliative Medicine Team phone at [phone] for questions and concerns.  For individual provider: See Loretha Stapler

## 2023-02-08 NOTE — Progress Notes (Signed)
[facility]  [facility] Coordination Care Guide  Direct Dial: [phone]

## 2023-02-08 NOTE — Plan of Care (Signed)
  Problem: Acute Rehab PT Goals(only PT should resolve) Goal: Pt Will Go Supine/Side To Sit Outcome: Progressing Flowsheets (Taken 02/08/2023 1223) Pt will go Supine/Side to Sit:  with contact guard assist  with supervision Goal: Patient Will Transfer Sit To/From Stand Outcome: Progressing Flowsheets (Taken 02/08/2023 1223) Patient will transfer sit to/from stand:  with contact guard assist  with minimal assist Goal: Pt Will Transfer Bed To Chair/Chair To Bed Outcome: Progressing Flowsheets (Taken 02/08/2023 1223) Pt will Transfer Bed to Chair/Chair to Bed:  with contact guard assist  with min assist Goal: Pt Will Ambulate Outcome: Progressing Flowsheets (Taken 02/08/2023 1223) Pt will Ambulate:  50 feet  with contact guard assist  with minimal assist  with rolling walker   12:24 PM, 02/08/23 Ocie Bob, MPT Physical Therapist with [facility] [phone] office [phone] mobile phone

## 2023-02-09 ENCOUNTER — Ambulatory Visit (HOSPITAL_COMMUNITY): Admission: RE | Admit: 2023-02-09 | Payer: Medicare Other | Source: Ambulatory Visit

## 2023-02-09 ENCOUNTER — Ambulatory Visit: Payer: Medicare Other | Admitting: Obstetrics & Gynecology

## 2023-02-09 DIAGNOSIS — K767 Hepatorenal syndrome: Secondary | ICD-10-CM | POA: Diagnosis not present

## 2023-02-09 DIAGNOSIS — K7469 Other cirrhosis of liver: Secondary | ICD-10-CM | POA: Diagnosis not present

## 2023-02-09 LAB — RENAL FUNCTION PANEL
Albumin: 4.3 g/dL (ref 3.5–5.0)
Anion gap: 13 (ref 5–15)
BUN: 83 mg/dL — ABNORMAL HIGH (ref 8–23)
CO2: 20 mmol/L — ABNORMAL LOW (ref 22–32)
Calcium: 7.8 mg/dL — ABNORMAL LOW (ref 8.9–10.3)
Chloride: 104 mmol/L (ref 98–111)
Creatinine, Ser: 7.1 mg/dL — ABNORMAL HIGH (ref 0.44–1.00)
GFR, Estimated: 6 mL/min — ABNORMAL LOW (ref >60)
Glucose, Bld: 152 mg/dL — ABNORMAL HIGH (ref 70–99)
Phosphorus: 6.3 mg/dL — ABNORMAL HIGH (ref 2.5–4.6)
Potassium: 4 mmol/L (ref 3.5–5.1)
Sodium: 137 mmol/L (ref 135–145)

## 2023-02-09 LAB — COMPREHENSIVE METABOLIC PANEL
ALT: 9 U/L (ref 0–44)
AST: 13 U/L — ABNORMAL LOW (ref 15–41)
Albumin: 3.9 g/dL (ref 3.5–5.0)
Alkaline Phosphatase: 41 U/L (ref 38–126)
Anion gap: 16 — ABNORMAL HIGH (ref 5–15)
BUN: 101 mg/dL — ABNORMAL HIGH (ref 8–23)
CO2: 17 mmol/L — ABNORMAL LOW (ref 22–32)
Calcium: 7.6 mg/dL — ABNORMAL LOW (ref 8.9–10.3)
Chloride: 104 mmol/L (ref 98–111)
Creatinine, Ser: 8.84 mg/dL — ABNORMAL HIGH (ref 0.44–1.00)
GFR, Estimated: 4 mL/min — ABNORMAL LOW (ref >60)
Glucose, Bld: 187 mg/dL — ABNORMAL HIGH (ref 70–99)
Potassium: 4.3 mmol/L (ref 3.5–5.1)
Sodium: 137 mmol/L (ref 135–145)
Total Bilirubin: 2 mg/dL — ABNORMAL HIGH (ref <1.2)
Total Protein: 5.3 g/dL — ABNORMAL LOW (ref 6.5–8.1)

## 2023-02-09 LAB — GLUCOSE, CAPILLARY
Glucose-Capillary: 148 mg/dL — ABNORMAL HIGH (ref 70–99)
Glucose-Capillary: 106 mg/dL — ABNORMAL HIGH (ref 70–99)
Glucose-Capillary: 127 mg/dL — ABNORMAL HIGH (ref 70–99)
Glucose-Capillary: 134 mg/dL — ABNORMAL HIGH (ref 70–99)

## 2023-02-09 LAB — PROTIME-INR
INR: 2.2 — ABNORMAL HIGH (ref 0.8–1.2)
Prothrombin Time: 25.1 s — ABNORMAL HIGH (ref 11.4–15.2)

## 2023-02-09 LAB — HEPATITIS B SURFACE ANTIBODY, QUANTITATIVE: Hep B S AB Quant (Post): <3.5 m[IU]/mL — ABNORMAL LOW

## 2023-02-09 MED ORDER — HEPARIN SODIUM (PORCINE) 1000 UNIT/ML DIALYSIS
1000.0000 [IU] | INTRAMUSCULAR | Status: DC | PRN
  Administered 2023-02-11: 3000 [IU] via INTRAVENOUS_CENTRAL
  Filled 2023-02-09: qty 6
  Filled 2023-02-09: qty 3

## 2023-02-09 MED ORDER — CIPROFLOXACIN HCL 500 MG PO TABS
250.0000 mg | ORAL_TABLET | Freq: Two times a day (BID) | ORAL | Status: DC
  Administered 2023-02-09 – 2023-02-11 (×4): 250 mg via ORAL
  Filled 2023-02-09 (×4): qty 1

## 2023-02-09 MED ORDER — VITAMIN K1 10 MG/ML IJ SOLN
10.0000 mg | Freq: Once | INTRAVENOUS | Status: AC
  Administered 2023-02-09: 10 mg via INTRAVENOUS
  Filled 2023-02-09: qty 1

## 2023-02-09 MED ORDER — PRISMASOL BGK 4/2.5 32-4-2.5 MEQ/L REPLACEMENT SOLN: Status: DC

## 2023-02-09 MED ORDER — ALBUMIN HUMAN 25 % IV SOLN
100.0000 g | Freq: Every day | INTRAVENOUS | Status: DC
  Administered 2023-02-10 – 2023-02-11 (×2): 100 g via INTRAVENOUS
  Filled 2023-02-09 (×2): qty 400

## 2023-02-09 MED ORDER — PRISMASOL BGK 4/2.5 32-4-2.5 MEQ/L EC SOLN: Status: DC

## 2023-02-09 MED ORDER — BOOST / RESOURCE BREEZE PO LIQD CUSTOM
1.0000 | Freq: Two times a day (BID) | ORAL | Status: DC
  Administered 2023-02-09 – 2023-02-10 (×2): 1 via ORAL

## 2023-02-09 NOTE — Progress Notes (Signed)
PHARMACY NOTE:  ANTIMICROBIAL RENAL DOSAGE ADJUSTMENT  Current antimicrobial regimen includes a mismatch between antimicrobial dosage and estimated renal function.  As per policy approved by the Pharmacy & Therapeutics and Medical Executive Committees, the antimicrobial dosage will be adjusted accordingly.  Current antimicrobial dosage: ciprofloxacin 250 mg PO daily  Indication: SBP prophylaxis  Renal Function:  Estimated Creatinine Clearance: 7.1 mL/min (A) (by C-G formula based on SCr of 8.72 mg/dL (H)). []      On intermittent HD, scheduled: [x]      On CRRT    Antimicrobial dosage has been changed to: ciprofloxacin 250 mg PO BID  Additional comments: If pt comes off CRRT or transitions to HD, would change back to ciprofloxacin 250 mg daily.    Thank you for allowing pharmacy to be a part of this patient's care.  Pricilla Riffle, PharmD, BCPS Clinical Pharmacist 02/09/2023 1:04 PM

## 2023-02-09 NOTE — Progress Notes (Signed)
NAME:  [name], MRN:  [id number], DOB:  12/18/1950, LOS: 7 ADMISSION DATE:  02/02/2023, CONSULTATION DATE:  02/08/2023 REFERRING MD:  [place] [facility] CHIEF COMPLAINT:  Multiorgan failure   History of Present Illness:  72 year old woman who presented to [facility] 11/11 as a transfer from [facility] for multisystem organ failure. PMHx significant for HTN, HLD, T2DM, GERD, anemia of chronic disease, chronic thrombocytopenia, hypothyroidism, depression, CKD stage IV, NASH cirrhosis with portal HTN as evidenced nonbleeding EV and ascites requiring frequent paracenteses (c/b SBP 12/2022).  Patient was initially admitted to [facility] 02/02/23 after she was noted to have abnormal labs at GI clinic (Cr 7.26 and worsening metabolic acidosis). Recent admission for hepatic encephalopathy and AKI 3 weeks PTA. Patient has required multiple paracenteses, last 02/01/2023. She was found to have AKI on CKD with concern for hepatorenal syndrome, hypotension, UTI and was subsequently admitted for further workup. Completed antibiotic course for UTI. Unfortunately, despite appropriate supportive treatment for HRS with Albumin, IVF, Octreotide and Norepinephrine, her renal function continued to worsen along with ongoing anuria. It was felt that she should be transferred to [facility] or [facility] for initiation of CRRT vs HD. She has been seen by GI at [facility] who recommends Octreotide, Levophed, Albumin, Cipro and possible repeat paracentesis if she accumulates more fluid (she had a paracentesis on prior admission and was treated with CTX for SBP).  Transferred to [facility] 11/11 for ongoing management to include CRRT vs HD.  Pertinent Medical History:   Past Medical History:  Diagnosis Date   Alkaline phosphatase elevation    Anemia    Anemia in stage 4 chronic kidney disease (HCC) 10/15/2022   ASD (atrial septal defect)    Cataract    both eyes hx of   Chest pain    03-17-2013 last chest pain   Chronic kidney disease    Stage III kidney disease    Cirrhosis (HCC)    Depression    Diabetes mellitus type II    Family history of adverse reaction to anesthesia    Son hard to wake up   Fatigue    GERD (gastroesophageal reflux disease)    Gout    Headache(784.0)    occasional   Hyperlipidemia    Hypertension    Hypothyroidism    Neuropathy    Osteopenia    Peripheral neuropathy    Presence of pessary    Retinopathy    Significant Hospital Events: Including procedures, antibiotic start and stop dates in addition to other pertinent events   11/5 - Presented to [facility] after abnormal labs noted at GI clinic visit (Cr 7, metabolic acidosis). Admitted for multi-organ system dysfunction. Started on treatment for presumed HRS. 11/11 - Transferred to [facility] for higher level of care/CRRT initiation. Nephro consulted. L internal jugular Trialysis catheter placed for dialysis access. Remains on NE, octreotide.  Interim History / Subjective:  No significant events overnight L internal jugular Trialysis catheter placed for CRRT initiation Feeling ok overall, c/o back pain ?Mild ICU delirium, easily reoriented/answering questions appropriately PT/OT ordered to evaluate functional status in the setting of possible transplant eval  Objective:  Blood pressure (!) 136/52, pulse 66, temperature (!) 97.5 F (36.4 C), resp. rate 13, height 5\' 6"  (1.676 m), weight 100.9 kg, SpO2 97%.        Intake/Output Summary (Last 24 hours) at 02/09/2023 0750 Last data filed at 02/09/2023 0552 Gross per 24 hour  Intake 1862.66 ml  Output 45 ml  Net 1817.66 ml  Filed Weights   02/05/23 0600 02/08/23 0221  Weight: 96.9 kg 100.9 kg   Physical Examination: General: Acute-on-chronically ill-appearing older woman in NAD. Pleasant and conversant. HEENT: [place]/AT, anicteric sclera, PERRL, moist mucous membranes. Neck: L internal jugular Trialysis in place. Neuro: Awake, oriented x 4. Responds to verbal stimuli. Following commands consistently. Moves all 4  extremities spontaneously. Strength 4/5 in all 4 extremities. CV: RRR, few PVCs, no m/g/r. PULM: Breathing even and unlabored on 3LNC. Lung fields CTAB. GI: Soft, nontender, mildly distended. Hypoactive bowel sounds. +Small umbilical hernia, reducible. Extremities: Bilateral symmetric 2+ pitting LE edema noted. Skin: Warm/dry, mild ecchymosis to RLE, small well-healing ulceration to dorsal surface of L third toe.  Assessment & Plan:   AKI on CKD with development of HRS - worsening despite standard supportive therapies. - Admit to ICU for further care - Nephrology following, appreciate recommendations - Trialysis catheter placed for CRRT initiation 11/12 - Continue octreotide, NE, albumin - Trend BMP - Replete electrolytes as indicated - Monitor I&Os - Avoid nephrotoxic agents as able - Ensure adequate renal perfusion  NASH cirrhosis with hx of hepatic encephalopathy and SBP Team at [facility] has reached out to [facility] to inquire about transfer for transplant evaluation. At current time, [facility] recommends PT eval to assess her functional status as first steps as she would likely meet criteria for simultaneous liver/kidney transplant. - Continue empiric prophylactic Cipro per GI recs (appreciate pharm help with renal dosing) - Continue Lactulose - GI consult 11/12 - Assess need for paracentesis daily - PT/OT evaluation ordered, f/u recommendations (part of transplant candidacy workup)  Hypotension/shock - 2/2 above Hx HTN, HLD - Goal MAP > 80 in the setting of HRS - Levophed titrated to goal MAP - Continue albumin - Trend WBC, fever curve - Hold PTA Inderal, torsemide  Anemia and thrombocytopenia - 2/2 chronic illnesses above - Trend H&H - Monitor for signs of active bleeding - Transfuse for Hgb < 7.0, Plt < 20K or hemodynamically significant bleeding  T2DM - Basal Levemir 6U QHS - SSI - CBGs Q4H - Goal CBG 140-180  Hypothyroidism - Continue Synthroid  Best practice (evaluated  daily):  Diet/type: Regular consistency (see orders) DVT prophylaxis: SCD GI prophylaxis: N/A Lines: Central line Foley:  Yes, and it is still needed Code Status:  full code Last date of multidisciplinary goals of care discussion: None yet.  Critical care time:   The patient is critically ill with multiple organ system failure and requires high complexity decision making for assessment and support, frequent evaluation and titration of therapies, advanced monitoring, review of radiographic studies and interpretation of complex data.   Critical Care Time devoted to patient care services, exclusive of separately billable procedures, described in this note is 36 minutes.  Tim Lair, PA-C [facility] 02/09/23 7:51 AM  Please see [email or web address] for pager details.  From 7A-7P if no response, please call [phone] After hours, please call ELink [phone]

## 2023-02-09 NOTE — Progress Notes (Signed)
Initial Nutrition Assessment  DOCUMENTATION CODES:   Obesity unspecified  INTERVENTION:  - Liberalize to Regular diet to avoid restricting intake given increased nutrient needs due to starting CRRT. - Add Boost Breeze po TID, each supplement provides 250 kcal and 9 grams of protein - Add Magic cup TID with meals, each supplement provides 290 kcal and 9 grams of protein - Encourage intake at all meals and of supplements.  - Add B Complex with Vitamin C.  - Monitor weight trends.   NUTRITION DIAGNOSIS:   Increased nutrient needs related to catabolic illness (hepato-renal syndrome; starting CRRT) as evidenced by estimated needs.  GOAL:   Patient will meet greater than or equal to 90% of their needs  MONITOR:   PO intake, Supplement acceptance, Weight trends  REASON FOR ASSESSMENT:   Consult Assessment of nutrition requirement/status  ASSESSMENT:   72 y.o. female with NASH cirrhosis, CKD-4, DM2, HTN, HLD, GERD, hypothyroidism, depression, and chronic thrombocytopenia.    Admitted to [facility] 02/02/23 after she had abnormal labs at GI clinic (Cr 7.26 and worsening metabolic acidosis). She has multiple paracenteses, last one 02/01/2023. Transferred to [facility] 11/11 for ongoing management to include CRRT vs HD.   Patient drowsy at time of visit, RN's setting up CRRT. She reports a UBW of 299# and denied any recent changes in weight.  Per EMR, weight has fluctuated between the 194-212# over the past year. Patient weighed initially at 213# this admission and now weighed at 222#. Suspect current weight elevated due to mild pitting generalized edema and deep pitting RLE/LLE edema.   She endorses trying to eat 3 meals a day PTA but admits her appetite has been "lousy". Does not consume any nutrition supplements at home.   Current appetite fair and patient documented to have consumed 50-75% of meals yesterday. She appeared to grimace when discussing Ensure but reports she likes ice cream and  strawberry flavor so will add Magic Cup and Boost Breeze to support intake.    Medications reviewed and include: 1mg  folic acid, Renvela, Lactulose, Octreotide  Levophed @ 69mcg/min  Labs reviewed:  Creatinine 8.84 Phosphorus 8.4 HA1C 6.7 Blood Glucose 120-167 x24 hours   NUTRITION - FOCUSED PHYSICAL EXAM:  Flowsheet Row Most Recent Value  Orbital Region Moderate depletion  Upper Arm Region No depletion  Thoracic and Lumbar Region No depletion  Buccal Region Moderate depletion  Temple Region Severe depletion  Clavicle Bone Region No depletion  Clavicle and Acromion Bone Region No depletion  Scapular Bone Region Unable to assess  Dorsal Hand No depletion  Patellar Region No depletion  Anterior Thigh Region No depletion  Posterior Calf Region No depletion  Edema (RD Assessment) None  Hair Reviewed  Eyes Reviewed  Mouth Reviewed  Skin Reviewed  Nails Reviewed       Diet Order:   Diet Order             Diet regular Room service appropriate? Yes; Fluid consistency: Thin  Diet effective now                   EDUCATION NEEDS:  Education needs have been addressed  Skin:  Skin Assessment: Skin Integrity Issues: Skin Integrity Issues:: DTI DTI: Right and Left Buttocks  Last BM:  11/11  Height:  Ht Readings from Last 1 Encounters:  02/08/23 5\' 6"  (1.676 m)   Weight:  Wt Readings from Last 1 Encounters:  02/08/23 100.9 kg   Ideal Body Weight:  59.09 kg  BMI:  Body mass index is 35.9 kg/m.  Estimated Nutritional Needs:  Kcal:  2000-2250 kcals Protein:  120-150 grams Fluid:  >/= 2L    Shelle Iron RD, LDN For contact information, refer to [facility].

## 2023-02-09 NOTE — Plan of Care (Signed)
  Problem: Nutrition: Goal: Adequate nutrition will be maintained Outcome: Progressing   Problem: Coping: Goal: Level of anxiety will decrease Outcome: Progressing   Problem: Pain Management: Goal: General experience of comfort will improve Outcome: Progressing   Problem: Safety: Goal: Ability to remain free from injury will improve Outcome: Progressing   Problem: Clinical Measurements: Goal: Respiratory complications will improve Outcome: Not Progressing Goal: Cardiovascular complication will be avoided Outcome: Not Progressing   Problem: Activity: Goal: Risk for activity intolerance will decrease Outcome: Not Progressing   Problem: Elimination: Goal: Will not experience complications related to urinary retention Outcome: Not Progressing

## 2023-02-09 NOTE — Evaluation (Signed)
Occupational Therapy Evaluation Patient Details Name: [name] MRN: [id number] DOB: 1951/03/02 Today's Date: 02/09/2023   History of Present Illness Patient is a 72 year old female who [facility] with multisystem organ failure. L internal jugular catheter placed for CRRT initiation with noted delirium. PMH: anemia, NASH cirrhosis, GERD, HLD, DM II, HTN, ascites requiring frequent paracentesis.   Clinical Impression   Patient is a 72 year old female who was admitted for above. Patient  was living at home with family support prior level per chart review. Patient was hallucinating today with increased fatigue during session limiting assessment to bed level. Patient reporting having had change in ability to use R shoulder [facility]. Patient was able to engage in grooming and self feeding tasks with set up and min A for grooming tasks. Patient was noted to have decreased functional activity tolernace, decreased ROM, decreased BUE strength, decreased endurance, decreased sitting balance, decreased standing balanced, decreased safety awareness, and decreased knowledge of AE/AD impacting participation in ADLs. Patient would continue to benefit from skilled OT services at this time while admitted and after d/c to address noted deficits in order to improve overall safety and independence in ADLs.        If plan is discharge home, recommend the following: Two people to help with walking and/or transfers;Two people to help with bathing/dressing/bathroom;Assistance with cooking/housework;Direct supervision/assist for medications management;Assist for transportation;Help with stairs or ramp for entrance;Direct supervision/assist for financial management    Functional Status Assessment  Patient has had a recent decline in their functional status and demonstrates the ability to make significant improvements in function in a reasonable  and predictable amount of time.  Equipment Recommendations  None recommended by OT       Precautions / Restrictions Precautions Precautions: Fall Precaution Comments: CRRT Restrictions Weight Bearing Restrictions: No             ADL either performed or assessed with clinical judgement   ADL Overall ADL's : Needs assistance/impaired Eating/Feeding: Set up;Supervision/ safety;Bed level Eating/Feeding Details (indicate cue type and reason): sitting in chair position in bed with increased time to eat her vanilla ice cream. Grooming: Set up;Minimal assistance;Bed level Grooming Details (indicate cue type and reason): washing face chair position in bed. noted to be increasingly fatigued with minimal activity. Upper Body Bathing: Total assistance;Bed level   Lower Body Bathing: Total assistance;Bed level   Upper Body Dressing : Bed level;Total assistance   Lower Body Dressing: Bed level;Total assistance     Toilet Transfer Details (indicate cue type and reason): unable to assess with patients current level of fatigue Toileting- Clothing Manipulation and Hygiene: Bed level;Total assistance               Vision   Additional Comments: having visual hallucinations during session seeing bugs on ceiling.            Pertinent Vitals/Pain Pain Assessment Pain Assessment: Faces Faces Pain Scale: Hurts a little bit Pain Location: with all movement and tounge with eating ice cream Pain Descriptors / Indicators: Discomfort, Sore Pain Intervention(s): Limited activity within patient's tolerance, Monitored during session, Repositioned     Extremity/Trunk Assessment Upper Extremity Assessment Upper Extremity Assessment: RUE deficits/detail;LUE deficits/detail RUE Deficits / Details: patient reported having R shoulder issues "since getting to [place]". patient able to abduce about 45 degrees and FF about 20 degreed with grimancing noted.strength 3+/5 LUE Deficits / Details:  WFL ROM. strength  was 3+/5   Lower Extremity Assessment Lower Extremity Assessment: Defer to PT evaluation       Communication Communication Communication: Hearing impairment   Cognition Arousal: Alert Behavior During Therapy: Flat affect Overall Cognitive Status: Difficult to assess         General Comments: unable to provide PLOF. patient seeing bugs in room and did no recognize her reflection in the TV repoting there was a man in her TV. nurse in room.                Home Living Family/patient expects to be discharged to:: Private residence Living Arrangements: Children Available Help at Discharge: Family;Available PRN/intermittently Type of Home: Mobile home Home Access: Stairs to enter;Ramped entrance Entrance Stairs-Number of Steps: 2 Entrance Stairs-Rails: Right Home Layout: One level     Bathroom Shower/Tub: Producer, television/film/video: Handicapped height     Home Equipment: Agricultural consultant (2 wheels);Rollator (4 wheels);BSC/3in1;Shower seat   Additional Comments: information taken from PT eval. patient not very conversive during session.      Prior Functioning/Environment Prior Level of Function : Needs assist       Physical Assist : Mobility (physical);ADLs (physical) Mobility (physical): Bed mobility;Transfers;Gait;Stairs   Mobility Comments: household and short distanced Orthoptist ADLs Comments: Assisted by family        OT Problem List: Decreased activity tolerance;Impaired balance (sitting and/or standing);Decreased coordination;Decreased safety awareness;Decreased knowledge of precautions;Decreased knowledge of use of DME or AE;Impaired UE functional use;Increased edema;Cardiopulmonary status limiting activity      OT Treatment/Interventions: Self-care/ADL training;Therapeutic exercise;DME and/or AE instruction;Therapeutic activities;Patient/family education;Balance training    OT Goals(Current goals can be  found in the care plan section) Acute Rehab OT Goals Patient Stated Goal: none stated OT Goal Formulation: Patient unable to participate in goal setting Time For Goal Achievement: 02/23/23 Potential to Achieve Goals: Fair  OT Frequency: Min 2X/week       AM-PAC OT "6 Clicks" Daily Activity     Outcome Measure Help from another person eating meals?: A Little Help from another person taking care of personal grooming?: A Lot Help from another person toileting, which includes using toliet, bedpan, or urinal?: Total Help from another person bathing (including washing, rinsing, drying)?: Total Help from another person to put on and taking off regular upper body clothing?: Total Help from another person to put on and taking off regular lower body clothing?: Total 6 Click Score: 9   End of Session Nurse Communication: Other (comment) (in room)  Activity Tolerance: Patient limited by fatigue Patient left: in bed;with call bell/phone within reach;with bed alarm set;Other (comment) (nursing in room to start CRRT process)  OT Visit Diagnosis: Pain                Time: 1047-1101 OT Time Calculation (min): 14 min Charges:  OT General Charges $OT Visit: 1 Visit OT Evaluation $OT Eval Moderate Complexity: 1 Mod  Kelena Garrow OTR/L, MS Acute Rehabilitation Department Office# [phone]   Selinda Flavin 02/09/2023, 12:23 PM

## 2023-02-09 NOTE — Consult Note (Signed)
Referring Provider: [facility] Primary Care Physician:  Benita Stabile, MD Primary Gastroenterologist: [facility]  Reason for Consultation: Decompensated cirrhosis, hepatorenal syndrome  HPI: [name] is a 72 y.o. 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 Patient's creatinine was around 1.97 in October 2024.  Creatinine in November for was 7.26 with creatinine of almost 9 as of yesterday.  LFT remains normal.  BC showed severe thrombocytopenia with platelet count of 23 today, hemoglobin of 7.7 which is relatively stable, normal white counts, elevated INR of 2.2 today.  Diagnosed with SBP around 4 weeks ago.  Repeat acetic fluid counts negative for SBP.  Patient seen and examined at bedside.  Patient's son at bedside.  Discussed with RN.  Minimal urine output of 6200 cc in last 24 hours.  She denies any blood in the stool or black stool.  Complaining of generalized abdominal discomfort.  Complaining of weakness.  Past Medical History:  Diagnosis Date   Alkaline phosphatase elevation    Anemia    Anemia in stage 4 chronic kidney disease (HCC) 10/15/2022   ASD (atrial septal defect)    Cataract    both eyes hx of   Chest pain    03-17-2013 last chest pain   Chronic kidney disease    Stage III kidney disease   Cirrhosis (HCC)    Depression    Diabetes mellitus type II    Family history of adverse reaction to anesthesia    Son hard to wake up   Fatigue    GERD (gastroesophageal reflux disease)    Gout    Headache(784.0)    occasional   Hyperlipidemia    Hypertension    Hypothyroidism    Neuropathy    Osteopenia    Peripheral neuropathy    Presence of pessary    Retinopathy     Past Surgical History:  Procedure Laterality  Date   APPENDECTOMY     CATARACT EXTRACTION     CERVICAL SPINE SURGERY  2006   CHOLECYSTECTOMY N/A 04/07/2013   Procedure: LAPAROSCOPIC CHOLECYSTECTOMY WITH INTRAOPERATIVE CHOLANGIOGRAM;  Surgeon: Robyne Askew, MD;  Location: [facility];  Service: General;  Laterality: N/A;   COMBINED HYSTERECTOMY VAGINAL / OOPHORECTOMY / A&P REPAIR  1987   Unilateral oophorectomy, h/o uterine prolapse has right ovary   ERCP N/A 04/06/2013   Procedure: ENDOSCOPIC RETROGRADE CHOLANGIOPANCREATOGRAPHY (ERCP);  Surgeon: Theda Belfast, MD;  Location: [facility];  Service: Endoscopy;  Laterality: N/A;   ESOPHAGEAL BANDING N/A 09/26/2021   Procedure: ESOPHAGEAL BANDING;  Surgeon: Jeani Hawking, MD;  Location: [facility];  Service: Gastroenterology;  Laterality: N/A;   ESOPHAGEAL BANDING N/A 10/31/2021   Procedure: ESOPHAGEAL BANDING;  Surgeon: Jeani Hawking, MD;  Location: [facility];  Service: Gastroenterology;  Laterality: N/A;   ESOPHAGOGASTRODUODENOSCOPY (EGD) WITH PROPOFOL N/A 08/16/2020   Procedure: ESOPHAGOGASTRODUODENOSCOPY (EGD) WITH PROPOFOL;  Surgeon: Jeani Hawking, MD;  Location: [facility];  Service: Endoscopy;  Laterality: N/A;   ESOPHAGOGASTRODUODENOSCOPY (EGD) WITH PROPOFOL N/A 09/26/2021   Procedure: ESOPHAGOGASTRODUODENOSCOPY (EGD) WITH PROPOFOL;  Surgeon: Jeani Hawking, MD;  Location: [facility];  Service: Gastroenterology;  Laterality: N/A;   ESOPHAGOGASTRODUODENOSCOPY (EGD) WITH PROPOFOL N/A 10/31/2021   Procedure: ESOPHAGOGASTRODUODENOSCOPY (EGD) WITH PROPOFOL;  Surgeon: Jeani Hawking, MD;  Location: [facility];  Service: Gastroenterology;  Laterality: N/A;   EUS  N/A 03/31/2013   Procedure: UPPER ENDOSCOPIC ULTRASOUND (EUS) LINEAR;  Surgeon: Theda Belfast, MD;  Location: [facility];  Service: Endoscopy;  Laterality: N/A;   REFRACTIVE SURGERY     SPINAL FUSION     c4-c7   TONSILLECTOMY  age 73    Prior to Admission medications   Medication Sig Start Date End Date Taking?  Authorizing Provider  acetaminophen (TYLENOL) 500 MG tablet Take 500 mg by mouth every 6 (six) hours as needed for mild pain (pain score 1-3).   Yes [provider]  ciprofloxacin (CIPRO) 500 MG tablet Take 1 tablet (500 mg total) by mouth daily with breakfast. 01/20/23  Yes Emokpae, Courage, MD  insulin NPH Human (NOVOLIN N RELION) 100 UNIT/ML injection Inject 16 Units into the skin in the morning and at bedtime.   Yes [provider]  insulin regular (NOVOLIN R RELION) 100 units/mL injection Inject 5 Units into the skin 3 (three) times daily before meals.   Yes [provider]  lactose free nutrition (BOOST) LIQD Take 237 mLs by mouth daily.   Yes [provider]  lactulose (CHRONULAC) 10 GM/15ML solution Take 15 mLs (10 g total) by mouth 3 (three) times daily. Titrate to have 2-3 mushy stools per day Patient taking differently: Take 10 g by mouth daily as needed for mild constipation. 01/19/23  Yes Shon Hale, MD  levothyroxine (SYNTHROID) 125 MCG tablet Take 125 mcg by mouth daily before breakfast. 09/03/22  Yes [provider]  nystatin (MYCOSTATIN/NYSTOP) powder Apply 1 Application topically 3 (three) times daily. Patient taking differently: Apply 1 Application topically daily. 01/19/23  Yes Emokpae, Courage, MD  propranolol (INDERAL) 20 MG tablet Take 1 tablet (20 mg total) by mouth daily. 01/28/23  Yes Dolores Frame, MD  sodium bicarbonate 650 MG tablet Take 2 tablets (1,300 mg total) by mouth 2 (two) times daily. 09/10/22 09/10/23 Yes Emokpae, Courage, MD  torsemide (DEMADEX) 20 MG tablet Take 1 tablet (20 mg total) by mouth daily. 01/19/23  Yes Emokpae, Courage, MD  Vitamin D, Ergocalciferol, (DRISDOL) 1.25 MG (50000 UT) CAPS capsule Take 50,000 Units by mouth every Wednesday. 11/16/18  Yes [provider]    Scheduled Meds:  Chlorhexidine Gluconate Cloth  6 each Topical Daily   ciprofloxacin  250 mg Oral BID    famotidine  10 mg Oral QHS   feeding supplement  1 Container Oral BID BM   folic acid  1 mg Oral Daily   insulin aspart  0-15 Units Subcutaneous TID WC   insulin detemir  6 Units Subcutaneous QHS   lactulose  10 g Oral BID   levothyroxine  125 mcg Oral Q0600   lidocaine  1 patch Transdermal Q24H   liver oil-zinc oxide   Topical TID   nystatin  1 Application Topical TID   Continuous Infusions:   prismasol BGK 4/2.5 400 mL/hr at 02/09/23 1045    prismasol BGK 4/2.5 400 mL/hr at 02/09/23 1045   albumin human Stopped (02/09/23 1228)   norepinephrine (LEVOPHED) Adult infusion 3 mcg/min (02/09/23 1400)   octreotide (SANDOSTATIN) 500 mcg in sodium chloride 0.9 % 250 mL (2 mcg/mL) infusion 50 mcg/hr (02/09/23 1400)   prismasol BGK 4/2.5 1,500 mL/hr at 02/09/23 1431   PRN Meds:.acetaminophen **OR** acetaminophen, fentaNYL (SUBLIMAZE) injection, heparin, lidocaine, nystatin, ondansetron **OR** ondansetron (ZOFRAN) IV, oxyCODONE  Allergies as of 02/02/2023 - Review Complete 02/02/2023  Allergen Reaction Noted   Bactrim [sulfamethoxazole-trimethoprim] Nausea And Vomiting and Other (See Comments) 09/03/2022  Cipro [ciprofloxacin hcl] Other (See Comments)    Codeine Other (See Comments) 10/19/2018   Cozaar [losartan potassium] Cough 11/25/2020   Lipitor [atorvastatin] Other (See Comments) 11/25/2020   Mevacor [lovastatin] Other (See Comments) 11/25/2020   Pravachol [pravastatin] Other (See Comments) 09/18/2022   Rosuvastatin Diarrhea and Other (See Comments) 11/25/2020   Statins Other (See Comments) 11/25/2020   Welchol [colesevelam] Other (See Comments) 11/25/2020   Zestril [lisinopril] Cough 11/25/2020   Zetia [ezetimibe] Other (See Comments) 11/25/2020   Dilaudid [hydromorphone] Nausea And Vomiting and Other (See Comments) 03/31/2013    Family History  Problem Relation Age of Onset   COPD Mother    Lung cancer Father    Diabetes Sister    Cataracts Sister    Insulin resistance  Daughter    Insulin resistance Son     Social History   Socioeconomic History   Marital status: Divorced    Spouse name: Not on file   Number of children: 3   Years of education: 12   Highest education level: Not on file  Occupational History   Occupation: [employer]-Retired    Associate Professor: [employer]  Tobacco Use   Smoking status: Never   Smokeless tobacco: Never  Vaping Use   Vaping status: Never Used  Substance and Sexual Activity   Alcohol use: No   Drug use: No   Sexual activity: Not Currently    Birth control/protection: Surgical    Comment: hyst  Other Topics Concern   Not on file  Social History Narrative   Divorced   Lives alone. Reports that her son recently moved out after living with her for a long time. She reports that she is happy to be living alone and feels like she was enabling his behavior. Reports that he had a history of drug use.   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    No pets.   Eats all food groups.    Wears seat belt.    Lives in house.    Smoke detectors.    Social Determinants of Health   Financial Resource Strain: Medium Risk (10/30/2019)   Overall Financial Resource Strain (CARDIA)    Difficulty of Paying Living Expenses: Somewhat hard  Food Insecurity: No Food Insecurity (02/06/2023)   Hunger Vital Sign    Worried About Running Out of Food in the Last Year: Never true    Ran Out of Food in the Last Year: Never true  Transportation Needs: No Transportation Needs (02/06/2023)   PRAPARE - Administrator, Civil Service (Medical): No    Lack of Transportation (Non-Medical): No  Physical Activity: Inactive (10/30/2019)   Exercise Vital Sign    Days of Exercise per Week: 0 days    Minutes of Exercise per Session: 0 min  Stress: No Stress Concern Present (10/30/2019)   [employer] of Occupational Health - Occupational Stress Questionnaire    Feeling of Stress : Not  at all  Social Connections: Moderately Integrated (10/30/2019)   Social Connection and Isolation Panel [NHANES]    Frequency of Communication with Friends and Family: More than three times a week    Frequency of Social Gatherings with Friends and Family: Three times a week    Attends Religious Services: More than 4 times per year    Active Member of Clubs or Organizations: Yes    Attends Banker Meetings: 1  to 4 times per year    Marital Status: Divorced  Intimate Partner Violence: Not At Risk (02/06/2023)   Humiliation, Afraid, Rape, and Kick questionnaire    Fear of Current or Ex-Partner: No    Emotionally Abused: No    Physically Abused: No    Sexually Abused: No    Review of Systems: All negative except as stated above in HPI.  Physical Exam: Vital signs: Vitals:   02/09/23 1415 02/09/23 1430  BP: 121/61 (!) 117/57  Pulse: 66 64  Resp: 11 12  Temp: 98.4 F (36.9 C) 98.6 F (37 C)  SpO2: 98% 98%   Last BM Date : 02/08/23 General:   Weak appearing patient, appears older than her age, multiple lines in place Lungs: No visible respiratory distress Heart:  Regular rate and rhythm; no murmurs, clicks, rubs,  or gallops. Abdomen: Minimally distended abdomen with generalized discomfort but no significant tenderness, bowel sound present, no peritoneal signs Alert and oriented x 2 but slow to respond Bilateral lower extremity edema noted  Rectal:  Deferred  GI:  Lab Results: Recent Labs    02/07/23 0335 02/08/23 0353 02/08/23 2126  WBC 5.4 6.4 6.5  HGB 8.3* 8.1* 7.7*  HCT 25.1* 24.9* 23.1*  PLT 23* 23* 23*   BMET Recent Labs    02/08/23 0353 02/08/23 2126 02/09/23 0705  NA 136 137 137  K 4.1 4.2 4.3  CL 104 104 104  CO2 16* 17* 17*  GLUCOSE 202* 210* 187*  BUN 99* 102* 101*  CREATININE 9.04* 8.72* 8.84*  CALCIUM 7.7* 7.6* 7.6*   LFT Recent Labs    02/07/23 1047 02/08/23 0353 02/09/23 0705  PROT 6.0*   < > 5.3*  ALBUMIN 4.9   < > 3.9  AST  12*   < > 13*  ALT 10   < > 9  ALKPHOS 41   < > 41  BILITOT 2.0*   < > 2.0*  BILIDIR 0.7*  --   --   IBILI 1.3*  --   --    < > = values in this interval not displayed.   PT/INR Recent Labs    02/08/23 0353 02/09/23 0705  LABPROT 23.8* 25.1*  INR 2.1* 2.2*     Studies/Results: DG CHEST PORT 1 VIEW  Result Date: 02/08/2023 CLINICAL DATA:  Central venous catheter EXAM: PORTABLE CHEST 1 VIEW COMPARISON:  Chest x-ray 02/03/2023 FINDINGS: New left-sided central venous catheter tip projects over the brachiocephalic vein. Right-sided central venous catheter tip projects over the mid SVC, unchanged. The heart is mildly enlarged. There central pulmonary vascular congestion which has increased. There is no lung infiltrate, pleural effusion or pneumothorax. The osseous structures are stable. IMPRESSION: 1. New left-sided central venous catheter tip projects over the brachiocephalic vein. No pneumothorax. 2. Mild cardiomegaly with central pulmonary vascular congestion which has increased. Electronically Signed   By: Darliss Cheney M.D.   On: 02/08/2023 23:41    Impression/Plan: -Acute and chronic kidney injury with worsening creatinine up to 9.  Recent diagnosis of SBP in setting of cirrhosis.  Likely hepatorenal syndrome.  No improvement with Levophed drip and octreotide.  Was started on CRRT today. -Decompensated NASH cirrhosis -MELD score will be artificially elevated because of elevated creatinine. -Ascites complicated by SBP -Esophageal varices  Recommendations -------------------------- -Recommend to increase albumin infusion to 100 g/day. -Patient is already on Levophed drip with MAP more than 60.  Not sure if adding midodrine will be of any benefit. -[facility] liver  transplant team is aware of the patient and currently requesting certain assessments. -Recommend IV vitamin K for elevated INR -Continue ciprofloxacin and lactulose. -Limit narcotics -Poor prognosis discussed with the family  at bedside.    LOS: 7 days   Kathi Der  MD, FACP 02/09/2023, 2:56 PM  Contact #  [phone]

## 2023-02-09 NOTE — Progress Notes (Signed)
[facility] Progress Note  Subjective: pt seen in ICU at [facility]. Drowsy and not able to carry a conversation.   Vitals:   02/09/23 1230 02/09/23 1245 02/09/23 1300 02/09/23 1315  BP: (!) 126/37 (!) 132/39 (!) 137/43 (!) 122/43  Pulse: 65 66 72 71  Resp: 12 13 16  (!) 21  Temp: 98.2 F (36.8 C) 98.2 F (36.8 C) 98.4 F (36.9 C) 98.4 F (36.9 C)  TempSrc:      SpO2: 99% 98% 98% 98%  Weight:      Height:        Exam: Gen: NAD, tired appearing CVS: S1S2, RRR,  RIJ HD cath accessed Resp: normal wob, unlabored, decreased breath sounds bibasilar Abd: Soft, distended, nontender Ext: diffuse 2-3+ bilat pitting edema of LEs and abd wall 1-2+ edema Neuro: drowsy, awake , follows simple commands     Date   Creat  eGFR    2010- 2022  1.12- 2.13    2023   1.24- 1.40 41- 47 ml/min, IIIb    April -may 2024 1.50- 2.82 17- 38 ml/min, IIIb-IV     June 2024  4.09 >> 3.69    October 06, 2022  2.03  26 ml/min, IV     10/13- 01/18/23 2.77 >> 1.89 18- 27 ml/min, IV    02/01/23  7.26    11/06   8.22    11/08   8.14     11/10  8.95     11/12  9.04     UNa 31, UCr 111 - on 11/06    UA - mod LE, neg protein, 6-10 rbcs/ 11-20 epis/ >50 wbcs     I/O = 14L in and 0.3 L UOP = +13.8 L since admit      Renal US - 9.1 / 8.6 cm kidneys w/o hydro   Assessment/ Plan  AKI on CKD 4: baseline creatinine 2.0 from July 2024.  Pt is anuric here in the setting of advanced cirrhosis. Progressive worsening of renal function due most likely to hepatorenal syndrome. Did not respond to levophed and octreotide. Given significant azotemia and declining urine output, plan is to start CRRT at [facility] today.  NASH cirrhosis: ongoing close management by gastroenterology and has been started on ciprofloxacin for SBP prophylaxis along with lactulose to avoid hepatic encephalopathy.  Current plan is to assess functional status for transplant candidacy. PT eval pending Anemia: Hb 7- 9 range, transfuse prn for hgb <7.  Checking Fe panel Metabolic acidosis: secondary to AKI and cirrhosis. Will dc sod bicarb tabs now that is on CRRT.    Vinson Moselle MD  [facility] 02/09/2023, 1:48 PM  Recent Labs  Lab 02/07/23 0335 02/07/23 1047 02/08/23 0353 02/08/23 2126  HGB 8.3*  --  8.1* 7.7*  ALBUMIN 4.0   < > 3.8 4.1  CALCIUM 7.8*  --  7.7* 7.6*  PHOS 9.4*  --   --  8.4*  CREATININE 8.95*  --  9.04* 8.72*  K 4.2  --  4.1 4.2   < > = values in this interval not displayed.   No results for input(s): "IRON", "TIBC", "FERRITIN" in the last 168 hours. Inpatient medications:  Chlorhexidine Gluconate Cloth  6 each Topical Daily   ciprofloxacin  250 mg Oral BID   famotidine  10 mg Oral QHS   feeding supplement  1 Container Oral BID BM   folic acid  1 mg Oral Daily   insulin aspart  0-15 Units Subcutaneous TID WC  insulin detemir  6 Units Subcutaneous QHS   lactulose  10 g Oral BID   levothyroxine  125 mcg Oral Q0600   lidocaine  1 patch Transdermal Q24H   liver oil-zinc oxide   Topical TID   nystatin  1 Application Topical TID   sevelamer carbonate  800 mg Oral TID WC   sodium bicarbonate  1,300 mg Oral BID     prismasol BGK 4/2.5 400 mL/hr at 02/09/23 1045    prismasol BGK 4/2.5 400 mL/hr at 02/09/23 1045   albumin human Stopped (02/09/23 1228)   norepinephrine (LEVOPHED) Adult infusion 4 mcg/min (02/09/23 1300)   octreotide (SANDOSTATIN) 500 mcg in sodium chloride 0.9 % 250 mL (2 mcg/mL) infusion 50 mcg/hr (02/09/23 1300)   prismasol BGK 4/2.5 1,500 mL/hr at 02/09/23 1044   acetaminophen **OR** acetaminophen, fentaNYL (SUBLIMAZE) injection, heparin, lidocaine, nystatin, ondansetron **OR** ondansetron (ZOFRAN) IV, oxyCODONE

## 2023-02-09 NOTE — Progress Notes (Deleted)
OT Cancellation Note  Patient Details Name: [name] MRN: [id number] DOB: 15-Nov-1950   Cancelled Treatment:    Reason Eval/Treat Not Completed: Patient at procedure or test/ unavailable Dietitian is in room at this time with nurse present setting up for CRRT. OT to continue to follow and check back as schedule will allow Rosalio Loud, MS Acute Rehabilitation Department Office# [phone]  02/09/2023, 10:45 AM

## 2023-02-09 NOTE — Progress Notes (Signed)
Palliative: Chart review completed.   Mrs. Szymkowiak was transferred from [facility] to [facility].   It seems that she is now requiring CRRT.  Per critical care note 11/12 [facility] recommends physical therapy to assess functional status as first steps, as she would likely meet criteria for simultaneous liver/kidney transplant.  Mrs. Karam states that she would be agreeable to transplantation if possible. PMT to follow.  No charge Lillia Carmel, NP Palliative medicine team Team phone [phone]

## 2023-02-10 ENCOUNTER — Encounter: Payer: Self-pay | Admitting: Hematology

## 2023-02-10 DIAGNOSIS — Z7189 Other specified counseling: Secondary | ICD-10-CM

## 2023-02-10 DIAGNOSIS — D689 Coagulation defect, unspecified: Secondary | ICD-10-CM

## 2023-02-10 DIAGNOSIS — K767 Hepatorenal syndrome: Secondary | ICD-10-CM | POA: Diagnosis not present

## 2023-02-10 LAB — MAGNESIUM: Magnesium: 2 mg/dL (ref 1.7–2.4)

## 2023-02-10 LAB — RENAL FUNCTION PANEL
Albumin: 4.1 g/dL (ref 3.5–5.0)
Albumin: 4.1 g/dL (ref 3.5–5.0)
Anion gap: 11 (ref 5–15)
Anion gap: 9 (ref 5–15)
BUN: 53 mg/dL — ABNORMAL HIGH (ref 8–23)
BUN: 30 mg/dL — ABNORMAL HIGH (ref 8–23)
CO2: 21 mmol/L — ABNORMAL LOW (ref 22–32)
CO2: 18 mmol/L — ABNORMAL LOW (ref 22–32)
Calcium: 7.8 mg/dL — ABNORMAL LOW (ref 8.9–10.3)
Calcium: 6.9 mg/dL — ABNORMAL LOW (ref 8.9–10.3)
Chloride: 104 mmol/L (ref 98–111)
Chloride: 112 mmol/L — ABNORMAL HIGH (ref 98–111)
Creatinine, Ser: 4.5 mg/dL — ABNORMAL HIGH (ref 0.44–1.00)
Creatinine, Ser: 2.63 mg/dL — ABNORMAL HIGH (ref 0.44–1.00)
GFR, Estimated: 10 mL/min — ABNORMAL LOW (ref >60)
GFR, Estimated: 19 mL/min — ABNORMAL LOW (ref >60)
Glucose, Bld: 157 mg/dL — ABNORMAL HIGH (ref 70–99)
Glucose, Bld: 105 mg/dL — ABNORMAL HIGH (ref 70–99)
Phosphorus: 3.8 mg/dL (ref 2.5–4.6)
Phosphorus: 2.2 mg/dL — ABNORMAL LOW (ref 2.5–4.6)
Potassium: 3.9 mmol/L (ref 3.5–5.1)
Potassium: 3.2 mmol/L — ABNORMAL LOW (ref 3.5–5.1)
Sodium: 136 mmol/L (ref 135–145)
Sodium: 139 mmol/L (ref 135–145)

## 2023-02-10 LAB — PROTIME-INR
INR: 2.3 — ABNORMAL HIGH (ref 0.8–1.2)
INR: 4.4 (ref 0.8–1.2)
Prothrombin Time: 25.3 s — ABNORMAL HIGH (ref 11.4–15.2)
Prothrombin Time: 42.5 s — ABNORMAL HIGH (ref 11.4–15.2)

## 2023-02-10 LAB — GLUCOSE, CAPILLARY
Glucose-Capillary: 118 mg/dL — ABNORMAL HIGH (ref 70–99)
Glucose-Capillary: 148 mg/dL — ABNORMAL HIGH (ref 70–99)
Glucose-Capillary: 122 mg/dL — ABNORMAL HIGH (ref 70–99)
Glucose-Capillary: 138 mg/dL — ABNORMAL HIGH (ref 70–99)

## 2023-02-10 LAB — CBC
HCT: 21.7 % — ABNORMAL LOW (ref 36.0–46.0)
Hemoglobin: 7.3 g/dL — ABNORMAL LOW (ref 12.0–15.0)
MCH: 33.6 pg (ref 26.0–34.0)
MCHC: 33.6 g/dL (ref 30.0–36.0)
MCV: 100 fL (ref 80.0–100.0)
Platelets: 19 10*3/uL — CL (ref 150–400)
RBC: 2.17 MIL/uL — ABNORMAL LOW (ref 3.87–5.11)
RDW: 15.6 % — ABNORMAL HIGH (ref 11.5–15.5)
WBC: 5.4 10*3/uL (ref 4.0–10.5)
nRBC: 0 % (ref 0.0–0.2)

## 2023-02-10 LAB — HEPATIC FUNCTION PANEL
ALT: 9 U/L (ref 0–44)
AST: 14 U/L — ABNORMAL LOW (ref 15–41)
Albumin: 4.2 g/dL (ref 3.5–5.0)
Alkaline Phosphatase: 42 U/L (ref 38–126)
Bilirubin, Direct: 0.8 mg/dL — ABNORMAL HIGH (ref 0.0–0.2)
Indirect Bilirubin: 1.9 mg/dL — ABNORMAL HIGH (ref 0.3–0.9)
Total Bilirubin: 2.7 mg/dL — ABNORMAL HIGH (ref <1.2)
Total Protein: 5.4 g/dL — ABNORMAL LOW (ref 6.5–8.1)

## 2023-02-10 LAB — CULTURE, BODY FLUID W GRAM STAIN -BOTTLE: Culture: NO GROWTH

## 2023-02-10 LAB — APTT: aPTT: 85 s — ABNORMAL HIGH (ref 24–36)

## 2023-02-10 LAB — BRAIN NATRIURETIC PEPTIDE: B Natriuretic Peptide: 1138.2 pg/mL — ABNORMAL HIGH (ref 0.0–100.0)

## 2023-02-10 MED ORDER — VITAMIN K1 10 MG/ML IJ SOLN
5.0000 mg | Freq: Once | INTRAVENOUS | Status: AC
  Administered 2023-02-10: 5 mg via INTRAVENOUS
  Filled 2023-02-10: qty 0.5

## 2023-02-10 MED ORDER — NOREPINEPHRINE 4 MG/250ML-% IV SOLN: 6.0000 ug/min | Freq: Once | INTRAVENOUS | Status: DC

## 2023-02-10 MED ORDER — POTASSIUM CHLORIDE CRYS ER 20 MEQ PO TBCR
40.0000 meq | EXTENDED_RELEASE_TABLET | Freq: Once | ORAL | Status: AC
Start: 2023-02-10 — End: 2023-02-10
  Administered 2023-02-10: 40 meq via ORAL
  Filled 2023-02-10: qty 2

## 2023-02-10 MED ORDER — NOREPINEPHRINE 4 MG/250ML-% IV SOLN
2.0000 ug/min | INTRAVENOUS | Status: DC
  Administered 2023-02-10: 6 ug/min via INTRAVENOUS
  Administered 2023-02-11: 2 ug/min via INTRAVENOUS
  Filled 2023-02-10 (×2): qty 250

## 2023-02-10 MED ORDER — VITAMIN K1 10 MG/ML IJ SOLN
10.0000 mg | Freq: Every day | INTRAVENOUS | Status: DC
  Administered 2023-02-10 – 2023-02-11 (×2): 10 mg via INTRAVENOUS
  Filled 2023-02-10 (×2): qty 1

## 2023-02-10 MED ORDER — MIDODRINE HCL 5 MG PO TABS
5.0000 mg | ORAL_TABLET | Freq: Three times a day (TID) | ORAL | Status: DC
  Administered 2023-02-10 – 2023-02-11 (×2): 5 mg via ORAL
  Filled 2023-02-10 (×2): qty 1

## 2023-02-10 MED ORDER — POTASSIUM CHLORIDE CRYS ER 20 MEQ PO TBCR
40.0000 meq | EXTENDED_RELEASE_TABLET | Freq: Once | ORAL | Status: AC
  Administered 2023-02-10: 40 meq via ORAL
  Filled 2023-02-10: qty 2

## 2023-02-10 NOTE — Progress Notes (Signed)
[facility] Progress Note  [name] 72 y.o. 03-Oct-1950  CC: Decompensated cirrhosis, hepatorenal syndrome   Subjective: Patient seen and examined at bedside.  Denies any acute issues overnight.  ROS : Afebrile, positive for weakness   Objective: Vital signs in last 24 hours: Vitals:   02/10/23 0830 02/10/23 0845  BP: (!) 94/51 (!) 109/32  Pulse: 62 62  Resp: 17 15  Temp: 98.8 F (37.1 C) 98.8 F (37.1 C)  SpO2: 97% 97%    Physical Exam:  General:   Weak appearing patient, appears older than her age, multiple lines in place Lungs: No visible respiratory distress Heart:  Regular rate and rhythm; no murmurs, clicks, rubs,  or gallops. Abdomen: Minimally distended abdomen with generalized discomfort but no significant tenderness, bowel sound present, no peritoneal signs Alert and oriented x 2 but slow to respond Bilateral lower extremity edema noted     Lab Results: Recent Labs    02/09/23 1552 02/10/23 0449  NA 137 136  K 4.0 3.9  CL 104 104  CO2 20* 21*  GLUCOSE 152* 157*  BUN 83* 53*  CREATININE 7.10* 4.50*  CALCIUM 7.8* 7.8*  MG  --  2.0  PHOS 6.3* 3.8   Recent Labs    02/09/23 0705 02/09/23 1552 02/10/23 0449  AST 13*  --  14*  ALT 9  --  9  ALKPHOS 41  --  42  BILITOT 2.0*  --  2.7*  PROT 5.3*  --  5.4*  ALBUMIN 3.9 4.3 4.2  4.1   Recent Labs    02/08/23 2126 02/10/23 0449  WBC 6.5 5.4  HGB 7.7* 7.3*  HCT 23.1* 21.7*  MCV 100.9* 100.0  PLT 23* 19*   Recent Labs    02/09/23 0705 02/10/23 0449  LABPROT 25.1* 25.3*  INR 2.2* 2.3*      Assessment/Plan: -Acute and chronic kidney injury with worsening creatinine up to 9.  Recent diagnosis of SBP in setting of cirrhosis.  Likely hepatorenal syndrome.  No improvement with Levophed drip and octreotide.  Was started on CRRT today. -Decompensated NASH cirrhosis -MELD score will be artificially elevated because of elevated creatinine. -Ascites complicated by  SBP -Esophageal varices   Recommendations -------------------------- -Patient with improvement in creatinine but worsening thrombocytopenia and increasing INR. -Continue albumin infusion to 100 g/day -Continue IV vitamin K for elevated INR  -[facility] transplant team is aware of the patient and currently requesting certain assessments.  ICU team has been in contact with the [facility] transplant team.  She may need a combined liver and kidney transplant.  -Patient is already on Levophed drip with MAP more than 60.  Not sure if adding midodrine will be of any benefit. -Continue ciprofloxacin and lactulose. -Limit narcotics -Poor prognosis      Kathi Der MD, FACP 02/10/2023, 10:07 AM  Contact #  [phone]

## 2023-02-10 NOTE — Progress Notes (Signed)
NAME:  [name], MRN:  [id number], DOB:  Nov 06, 1950, LOS: 8 ADMISSION DATE:  02/02/2023, CONSULTATION DATE:  02/08/2023 REFERRING MD:  [place] - [facility] CHIEF COMPLAINT:  Multiorgan failure   History of Present Illness:  72 year old woman who [facility] as a transfer from [facility] for multisystem organ failure. PMHx significant for HTN, HLD, T2DM, GERD, anemia of chronic disease, chronic thrombocytopenia, hypothyroidism, depression, CKD stage IV, NASH cirrhosis with portal HTN as evidenced nonbleeding EV and ascites requiring frequent paracenteses (c/b SBP 12/2022).  Patient was initially admitted to [facility] 02/02/23 after she was noted to have abnormal labs at GI clinic (Cr 7.26 and worsening metabolic acidosis). Recent admission for hepatic encephalopathy and AKI 3 weeks PTA. Patient has required multiple paracenteses, last 02/01/2023. She was found to have AKI on CKD with concern for hepatorenal syndrome, hypotension, UTI and was subsequently admitted for further workup. Completed antibiotic course for UTI. Unfortunately, despite appropriate supportive treatment for HRS with Albumin, IVF, Octreotide and Norepinephrine, her renal function continued to worsen along with ongoing anuria. It was felt that she should be transferred to [facility] or [facility] for initiation of CRRT vs HD. She has been seen by GI at [facility] who recommends Octreotide, Levophed, Albumin, Cipro and possible repeat paracentesis if she accumulates more fluid (she had a paracentesis on prior admission and was treated with CTX for SBP).  Transferred to [facility] 11/11 for ongoing management to include CRRT vs HD.  Pertinent Medical History:   Past Medical History:  Diagnosis Date   Alkaline phosphatase elevation    Anemia    Anemia in stage 4 chronic kidney disease (HCC) 10/15/2022   ASD (atrial septal defect)    Cataract    both eyes hx of   Chest pain    03-17-2013 last chest pain   Chronic kidney disease    Stage III kidney disease    Cirrhosis (HCC)    Depression    Diabetes mellitus type II    Family history of adverse reaction to anesthesia    Son hard to wake up   Fatigue    GERD (gastroesophageal reflux disease)    Gout    Headache(784.0)    occasional   Hyperlipidemia    Hypertension    Hypothyroidism    Neuropathy    Osteopenia    Peripheral neuropathy    Presence of pessary    Retinopathy    Significant Hospital Events: Including procedures, antibiotic start and stop dates in addition to other pertinent events   11/5 - Presented to [facility] after abnormal labs noted at GI clinic visit (Cr 7, metabolic acidosis). Admitted for multi-organ system dysfunction. Started on treatment for presumed HRS. 11/11 - Transferred to [facility] for higher level of care/CRRT initiation. Nephro consulted. L internal jugular Trialysis catheter placed for dialysis access. Remains on NE, octreotide. 11/12 feels like OT over did it yesterday and it was too much for her 11/13 NE to HRS MAP goal 80.   Interim History / Subjective:  Tbili up to 2.7 Hgb down to 7.1, plt to 19, Inr up to 2.3  On low dose NE but MAP is infrequently at HRS goal   Objective:  Blood pressure (!) 157/58, pulse 68, temperature 98.8 F (37.1 C), resp. rate 13, height 5\' 6"  (1.676 m), weight 100.5 kg, SpO2 97%. CVP:  [9 mmHg] 9 mmHg      Intake/Output Summary (Last 24 hours) at 02/10/2023 1238 Last data filed at 02/10/2023 1200 Gross per  24 hour  Intake 1355.52 ml  Output 3107 ml  Net -1751.48 ml   Filed Weights   02/05/23 0600 02/08/23 0221 02/10/23 0500  Weight: 96.9 kg 100.9 kg 100.5 kg   Physical Examination: General: Chronically and critically ill elderly F NAD  HEENT: NCAT pink mm L internal jugular HD cath oozing.  Neuro: A bit lethargic. Following commands., No focal def  CV: s1s2 no rgm  PULM: Even unlabored, symmetrical chest expansion  GI: Soft obese. Minimal distension. No peritoneal signs.  Extremities:Pitting edema  Skin: pale.  Scattered ecchymosis and petechiae   Assessment & Plan:   GOC DNR discussion -this admission revoked previous DNR and is now full code -palliative care has spoken w the pt a few times and pt is wanting all offered care, including transplant if offered -we talked in more detail about resuscitation-- there was much that she was unaware of. Remains full code but is continuing to consider.   Acute encephalopathy -uremia, hypoperfusion. We don't have an elevated ammonia this admission so hard to point to HE as contributor P -minimize sedating meds -CRRT as below   AKI on CKD, with uremia  HRS P -NE for MAP goal 80  -octreotide  -albumin  -nephro following for CRRT -not sure that midodrine will have much benefit at this point, but will go ahead and add-- Dont think it will have a downside   Decompensated NASH cirrhosis with ascites  Hx SBP  Hx HE Elevated LFTs Hyperbilirubinemia -A query was placed to [facility] re txp eval. As I understand it, they have advised that pending improvement in functional status she may be a candidate for SLK.  -I will reach out to [facility] 11/13 for more clarification on what exactly would qualify as an acceptable functional status -- this admission she has been very weak with PT/OT. At home pta has had fq falls. Could also ask about transfer to their center. P -GI following, appreciate recs -Cipro -lactulose -PRN para  -HRS tx as above   Anemia Thrombocytopenia Coagulopathy -in setting of above processes  -Plt and hgb cont to decline, INR cont to incr despite Vit K P -cont Vit K -does not meet a transfusion threshold for PRBc or plt right now  -follow CBC coags   Hx HTN, HLD - Hold PTA Inderal, torsemide   Dm2  - Basal Levemir 6U QHS - SSI  Hypothyroidism - Continue Synthroid  Best practice (evaluated daily):   Diet/type: Regular consistency (see orders) DVT prophylaxis: SCD GI prophylaxis: N/A Lines: Central line Foley:  Yes, and it is  still needed Code Status:  full code Last date of multidisciplinary goals of care discussion: pending   Critical care time:   CRITICAL CARE Performed by: Lanier Clam   Total critical care time: 53 minutes  Critical care time was exclusive of separately billable procedures and treating other patients. Critical care was necessary to treat or prevent imminent or life-threatening deterioration.  Critical care was time spent personally by me on the following activities: development of treatment plan with patient and/or surrogate as well as nursing, discussions with consultants, evaluation of patient's response to treatment, examination of patient, obtaining history from patient or surrogate, ordering and performing treatments and interventions, ordering and review of laboratory studies, ordering and review of radiographic studies, pulse oximetry and re-evaluation of patient's condition.  Tessie Fass MSN, AGACNP-BC [facility] Pulmonary/Critical Care Medicine Amion for pager  02/10/2023, 12:38 PM

## 2023-02-10 NOTE — Progress Notes (Signed)
Patient's son [name] met at bedside by patient's daughter and granddaughter. Daughter expressed that she didn't like her mother receiving opioids, didn't understand why she was having pain or why she wasn't getting paracentesis. Daughter continued to assert that if patient only had her paracentesis that she would be sitting up, walking around, eating, and not having pain. Daughter not receiving education about problems with multiple body systems and care interventions for them. She then spent two hours berating son [name]. Advised that further arguing about patient's care should be taken outside of patient's room as I must insist she allow patient to rest.

## 2023-02-10 NOTE — IPAL (Signed)
  Interdisciplinary Goals of Care Family Meeting   Date carried out: 02/10/2023  Location of the meeting: Phone conference  Member's involved: Nurse Practitioner and Family Member or next of kin  Durable Power of Attorney or acting medical decision maker: Son brian   Discussion: We discussed goals of care for [employer] .  After speaking with [facility], I spoke w pts son [name] on the phone and pt at bedside. [name] is realistic that this illness course caries a very poor prognosis and does not want his mom to suffer. He hopes to discuss hospice w his mom Allisa was very shocked to learn that she was not imminently a transplant candidate-- as I feared when speaking with her this morning, she was under the impression that if she worked a little better with PT she would be transferred to [facility] for emergent transplant. I am not sure where this notion originated from exactly but was glad I could provide her with formal confirmation of my suspicions.  We talked about GOC, specifically hospice. She ebbed and flowed with her thoughts about this -- not wanting to be in the hospital or prolong things vs not wanting to give up and wanting to check again if she can get an emergent transplant.   I encouraged her to talk with [name] when he comes, and said that we will continue talking about this tomorrow--  these decisions do not need to be made immediately.    I have also spoken with our nephrologist and gastroenterologist who support GOC discussions in this case with a very poor prognosis.  Code status:   Code Status: Full Code   Disposition: Continue current acute care  Time spent for the meeting: cct     Lanier Clam, NP  02/10/2023, 5:11 PM

## 2023-02-10 NOTE — Progress Notes (Signed)
PT Cancellation Note  Patient Details Name: [name] MRN: [id number] DOB: 1950/11/22   Cancelled Treatment:     pt unable to participate, will attempt another day as schedule permits.  Felecia Shelling  PTA Acute  Rehabilitation Services Office M-F          [phone]

## 2023-02-10 NOTE — Plan of Care (Signed)
  Problem: Clinical Measurements: Goal: Respiratory complications will improve Outcome: Progressing   Problem: Pain Management: Goal: General experience of comfort will improve Outcome: Progressing   Problem: Safety: Goal: Ability to remain free from injury will improve Outcome: Progressing   Problem: Coping: Goal: Ability to adjust to condition or change in health will improve Outcome: Progressing   Problem: Metabolic: Goal: Ability to maintain appropriate glucose levels will improve Outcome: Progressing

## 2023-02-10 NOTE — Progress Notes (Signed)
[facility] Progress Note  Subjective: pt seen in ICU at [facility]. More alert today. I/O yesterday: net neg 230 cc  Pressors: levo 2-5 UOP yest: 150 cc   Vitals:   02/10/23 1000 02/10/23 1015 02/10/23 1030 02/10/23 1045  BP: (!) 151/54 (!) 155/39  (!) 163/48  Pulse: 66 67 65 65  Resp: (!) 24 13 12  (!) 23  Temp: 98.8 F (37.1 C) 98.8 F (37.1 C) 98.8 F (37.1 C) 98.8 F (37.1 C)  TempSrc:      SpO2: 98% 97% 97% 98%  Weight:      Height:        Exam: Gen: NAD, more alert , interacting some CVS: S1S2, RRR,  RIJ HD cath accessed Resp: normal wob, unlabored, decreased breath sounds bibasilar Abd: Soft, distended, nontender Ext: diffuse 2-3+ bilat pitting edema of LEs and abd wall 1-2+ edema Neuro: looks better today, follows simple commands     Date   Creat  eGFR    2010- 2022  1.12- 2.13    2023   1.24- 1.40 41- 47 ml/min, IIIb    April -may 2024 1.50- 2.82 17- 38 ml/min, IIIb-IV     June 2024  4.09 >> 3.69    October 06, 2022  2.03  26 ml/min, IV     10/13- 01/18/23 2.77 >> 1.89 18- 27 ml/min, IV    02/01/23  7.26    11/06   8.22    11/08   8.14     11/10  8.95     11/12  9.04     UNa 31, UCr 111 - on 11/06    UA - mod LE, neg protein, 6-10 rbcs/ 11-20 epis/ >50 wbcs     I/O = 14L in and 0.3 L UOP = +13.8 L since admit      Renal US - 9.1 / 8.6 cm kidneys w/o hydro   Assessment/ Plan  AKI on CKD 4: baseline creatinine 2.0 from July 2024.  Pt is anuric here in the setting of advanced cirrhosis. Progressive worsening of renal function due most likely to hepatorenal syndrome. Did not respond to levophed and octreotide. Given significant azotemia pt was moved to [facility] and started on CRRT 11/12. Looks better today. Cont CRRT.  NASH cirrhosis: ongoing close management by gastroenterology and has been started on ciprofloxacin for SBP prophylaxis along with lactulose to avoid hepatic encephalopathy.  Current plan is to assess functional status for transplant candidacy. PT  eval pending Volume overload - tolerating 50-75 cc/hr net negative. Will ^ to 100- 125 cc/hr. Check CVP.  Anemia: Hb 7- 9 range, transfuse prn for hgb <7. Checking Fe panel Metabolic acidosis: secondary to AKI and cirrhosis. Better.    Vinson Moselle MD  [facility] 02/10/2023, 11:02 AM  Recent Labs  Lab 02/08/23 2126 02/09/23 0705 02/09/23 1552 02/10/23 0449  HGB 7.7*  --   --  7.3*  ALBUMIN 4.1   < > 4.3 4.2  4.1  CALCIUM 7.6*   < > 7.8* 7.8*  PHOS 8.4*  --  6.3* 3.8  CREATININE 8.72*   < > 7.10* 4.50*  K 4.2   < > 4.0 3.9   < > = values in this interval not displayed.   No results for input(s): "IRON", "TIBC", "FERRITIN" in the last 168 hours. Inpatient medications:  Chlorhexidine Gluconate Cloth  6 each Topical Daily   ciprofloxacin  250 mg Oral BID   famotidine  10 mg Oral QHS  feeding supplement  1 Container Oral BID BM   folic acid  1 mg Oral Daily   insulin aspart  0-15 Units Subcutaneous TID WC   insulin detemir  6 Units Subcutaneous QHS   lactulose  10 g Oral BID   levothyroxine  125 mcg Oral Q0600   lidocaine  1 patch Transdermal Q24H   liver oil-zinc oxide   Topical TID   nystatin  1 Application Topical TID     prismasol BGK 4/2.5 400 mL/hr at 02/09/23 2245    prismasol BGK 4/2.5 400 mL/hr at 02/09/23 2245   albumin human 60 mL/hr at 02/10/23 1000   norepinephrine (LEVOPHED) Adult infusion 5 mcg/min (02/10/23 1000)   norepinephrine (LEVOPHED) Adult infusion     octreotide (SANDOSTATIN) 500 mcg in sodium chloride 0.9 % 250 mL (2 mcg/mL) infusion 50 mcg/hr (02/10/23 1000)   phytonadione (VITAMIN K) 10 mg in dextrose 5 % 50 mL IVPB 10 mg (02/10/23 1101)   prismasol BGK 4/2.5 1,500 mL/hr at 02/10/23 0731   acetaminophen **OR** acetaminophen, fentaNYL (SUBLIMAZE) injection, heparin, lidocaine, nystatin, ondansetron **OR** ondansetron (ZOFRAN) IV, oxyCODONE

## 2023-02-10 NOTE — Progress Notes (Addendum)
  Spoke with on call hepatologist--  Pt would need to survive current hospitalization, successfully rehabilitate to a functional status better than her PTA status (falls at home PTA), and essentially be well enough to walk into clinic to be considered for evaluation. Of course beyond this would be add'l eval of physical status, social support, surgical risk (would think relatively high-risk), etc.   I think this is unlikely to occur and we will collaborate with nephro and GI here regarding goals of care.   ---------  I've called [facility] and asked to speak with txp hepatology, really appreciate [facility] transfer line's help.    In review of our notes and of a telephone note from 11/10 Dr. Suezanne Jacquet (txp at [facility]), frailty is noted as a limiting factor but that otherwise its possible pt could be a candidate if this improved. Rec'd PT eval   She was seen by PT 11/11 and was unable to ambulate away from the bedside due to weakness and fatigue. Was seen by OT 11/12 and noted to have decr functional activity tolerance, decr endurance, decr balance.  In our notes, it sounds like if her functional status improves, she could be eval'd for SKL. I am hoping for more concrete clarity from [facility] re what would be considered improved enough to proceed with eval.  With her PTA falls and weakness this admission, I'm concerned that her frailty may not be improvable anytime soon, and am trying to 1) est more concrete thresholds for what is considered improvement in status and 2) make sure that our goals of care are realistic and that ongoing care aligns with these goals.     CCT  Tessie Fass MSN, AGACNP-BC [facility] Pulmonary/Critical Care Medicine 02/10/2023, 2:57 PM

## 2023-02-10 NOTE — Progress Notes (Signed)
eLink Physician-Brief Progress Note Patient Name: [name] DOB: [birth year] MRN: [id number]   Date of Service  02/10/2023  HPI/Events of Note  INR 4.4, not actively bleeding but has ongoing coagulopathy issues, recently treated with Vitamin K.  eICU Interventions  Vitamin K 5 mg IV x 1 ordered.        [name] 02/10/2023, 8:50 PM

## 2023-02-10 NOTE — Plan of Care (Signed)
  Problem: Nutrition: Goal: Adequate nutrition will be maintained Outcome: Progressing   Problem: Coping: Goal: Level of anxiety will decrease Outcome: Progressing   Problem: Elimination: Goal: Will not experience complications related to bowel motility Outcome: Progressing   Problem: Pain Management: Goal: General experience of comfort will improve Outcome: Progressing   Problem: Elimination: Goal: Will not experience complications related to urinary retention Outcome: Not Progressing   Problem: Skin Integrity: Goal: Risk for impaired skin integrity will decrease Outcome: Not Progressing   Problem: Skin Integrity: Goal: Risk for impaired skin integrity will decrease Outcome: Not Progressing

## 2023-02-10 NOTE — Progress Notes (Signed)
PMT no charge note.   Patient was seen by palliative care at [facility]. Chart was reviewed, patient was noted to be resting in bed this afternoon. PCCM Colleague Amanda Cockayne notes reviewed and agree completely with her assessment, discussions and recommendations.  PMT remains available to be of assistance for ongoing goals of care discussions, PMT to follow up 02-11-23.  No charge Rosalin Hawking MD [place] palliative.

## 2023-02-11 DIAGNOSIS — K767 Hepatorenal syndrome: Secondary | ICD-10-CM | POA: Diagnosis not present

## 2023-02-11 DIAGNOSIS — K7581 Nonalcoholic steatohepatitis (NASH): Secondary | ICD-10-CM | POA: Diagnosis not present

## 2023-02-11 DIAGNOSIS — D689 Coagulation defect, unspecified: Secondary | ICD-10-CM

## 2023-02-11 DIAGNOSIS — Z66 Do not resuscitate: Secondary | ICD-10-CM

## 2023-02-11 DIAGNOSIS — D696 Thrombocytopenia, unspecified: Secondary | ICD-10-CM | POA: Diagnosis not present

## 2023-02-11 LAB — HEPATIC FUNCTION PANEL
ALT: 9 U/L (ref 0–44)
AST: 15 U/L (ref 15–41)
Albumin: 4.6 g/dL (ref 3.5–5.0)
Alkaline Phosphatase: 43 U/L (ref 38–126)
Bilirubin, Direct: 1.2 mg/dL — ABNORMAL HIGH (ref 0.0–0.2)
Indirect Bilirubin: 2.8 mg/dL — ABNORMAL HIGH (ref 0.3–0.9)
Total Bilirubin: 4 mg/dL — ABNORMAL HIGH (ref <1.2)
Total Protein: 6.1 g/dL — ABNORMAL LOW (ref 6.5–8.1)

## 2023-02-11 LAB — CBC
HCT: 23.7 % — ABNORMAL LOW (ref 36.0–46.0)
Hemoglobin: 7.6 g/dL — ABNORMAL LOW (ref 12.0–15.0)
MCH: 32.9 pg (ref 26.0–34.0)
MCHC: 32.1 g/dL (ref 30.0–36.0)
MCV: 102.6 fL — ABNORMAL HIGH (ref 80.0–100.0)
Platelets: 23 10*3/uL — CL (ref 150–400)
RBC: 2.31 MIL/uL — ABNORMAL LOW (ref 3.87–5.11)
RDW: 16 % — ABNORMAL HIGH (ref 11.5–15.5)
WBC: 4.8 10*3/uL (ref 4.0–10.5)
nRBC: 0 % (ref 0.0–0.2)

## 2023-02-11 LAB — GLUCOSE, CAPILLARY: Glucose-Capillary: 122 mg/dL — ABNORMAL HIGH (ref 70–99)

## 2023-02-11 LAB — PROTIME-INR
INR: 2.2 — ABNORMAL HIGH (ref 0.8–1.2)
Prothrombin Time: 25 s — ABNORMAL HIGH (ref 11.4–15.2)

## 2023-02-11 LAB — RENAL FUNCTION PANEL
Albumin: 4.8 g/dL (ref 3.5–5.0)
Anion gap: 10 (ref 5–15)
BUN: 23 mg/dL (ref 8–23)
CO2: 22 mmol/L (ref 22–32)
Calcium: 8.6 mg/dL — ABNORMAL LOW (ref 8.9–10.3)
Chloride: 102 mmol/L (ref 98–111)
Creatinine, Ser: 2.17 mg/dL — ABNORMAL HIGH (ref 0.44–1.00)
GFR, Estimated: 24 mL/min — ABNORMAL LOW (ref >60)
Glucose, Bld: 186 mg/dL — ABNORMAL HIGH (ref 70–99)
Phosphorus: 2.2 mg/dL — ABNORMAL LOW (ref 2.5–4.6)
Potassium: 4.8 mmol/L (ref 3.5–5.1)
Sodium: 134 mmol/L — ABNORMAL LOW (ref 135–145)

## 2023-02-11 LAB — MAGNESIUM: Magnesium: 2.3 mg/dL (ref 1.7–2.4)

## 2023-02-11 MED ORDER — ACETAMINOPHEN 325 MG PO TABS
650.0000 mg | ORAL_TABLET | Freq: Four times a day (QID) | ORAL | Status: DC | PRN
  Administered 2023-02-13: 650 mg via ORAL
  Filled 2023-02-11: qty 2

## 2023-02-11 MED ORDER — FENTANYL CITRATE PF 50 MCG/ML IJ SOSY
25.0000 ug | PREFILLED_SYRINGE | INTRAMUSCULAR | Status: DC | PRN
  Administered 2023-02-11 – 2023-02-12 (×3): 25 ug via INTRAVENOUS
  Filled 2023-02-11 (×4): qty 1

## 2023-02-11 MED ORDER — GLYCOPYRROLATE 1 MG PO TABS: 1.0000 mg | ORAL_TABLET | ORAL | Status: DC | PRN

## 2023-02-11 MED ORDER — GLYCOPYRROLATE 0.2 MG/ML IJ SOLN: 0.2000 mg | INTRAMUSCULAR | Status: DC | PRN

## 2023-02-11 MED ORDER — OLANZAPINE 10 MG IM SOLR
2.5000 mg | Freq: Once | INTRAMUSCULAR | Status: DC
  Filled 2023-02-11: qty 10

## 2023-02-11 MED ORDER — DIPHENHYDRAMINE HCL 50 MG/ML IJ SOLN: 25.0000 mg | INTRAMUSCULAR | Status: DC | PRN

## 2023-02-11 MED ORDER — ACETAMINOPHEN 650 MG RE SUPP: 650.0000 mg | Freq: Four times a day (QID) | RECTAL | Status: DC | PRN

## 2023-02-11 MED ORDER — MIDAZOLAM HCL 2 MG/2ML IJ SOLN
2.0000 mg | INTRAMUSCULAR | Status: DC | PRN
  Administered 2023-02-12: 2 mg via INTRAVENOUS
  Filled 2023-02-11: qty 2

## 2023-02-11 MED ORDER — HALOPERIDOL LACTATE 5 MG/ML IJ SOLN
2.5000 mg | INTRAMUSCULAR | Status: DC | PRN
  Administered 2023-02-14: 5 mg via INTRAVENOUS
  Filled 2023-02-11 (×2): qty 1

## 2023-02-11 MED ORDER — OXIDIZED CELLULOSE EX PADS
1.0000 | MEDICATED_PAD | Freq: Once | CUTANEOUS | Status: AC
  Administered 2023-02-11: 1 via TOPICAL
  Filled 2023-02-11: qty 1

## 2023-02-11 MED ORDER — OLANZAPINE 5 MG PO TBDP: 5.0000 mg | ORAL_TABLET | Freq: Once | ORAL | Status: DC

## 2023-02-11 MED ORDER — CIPROFLOXACIN HCL 500 MG PO TABS: 250.0000 mg | ORAL_TABLET | Freq: Every day | ORAL | Status: DC

## 2023-02-11 MED ORDER — ZINC OXIDE 40 % EX OINT: TOPICAL_OINTMENT | Freq: Three times a day (TID) | CUTANEOUS | Status: DC | PRN

## 2023-02-11 MED ORDER — MELATONIN 3 MG PO TABS
3.0000 mg | ORAL_TABLET | Freq: Once | ORAL | Status: DC
  Filled 2023-02-11: qty 1

## 2023-02-11 MED ORDER — POLYVINYL ALCOHOL 1.4 % OP SOLN: 1.0000 [drp] | Freq: Four times a day (QID) | OPHTHALMIC | Status: DC | PRN

## 2023-02-11 NOTE — Progress Notes (Signed)
-   [facility] Progress Note  ERANDI HOHENSEE 72 y.o. Jul 13, 1950  CC: Decompensated cirrhosis, hepatorenal syndrome   Subjective: Patient seen and examined at bedside.  She is confused this morning.  ROS : Not able to obtain   Objective: Vital signs in last 24 hours: Vitals:   02/11/23 1115 02/11/23 1145  BP:    Pulse:    Resp: 16 15  Temp: 98.8 F (37.1 C)   SpO2:      Physical Exam:  General:   Confused this morning Lungs: No visible respiratory distress Heart:  Regular rate and rhythm; no murmurs, clicks, rubs,  or gallops. Abdomen: Minimally distended abdomen with generalized discomfort but no significant tenderness, bowel sound present, no peritoneal signs     Lab Results: Recent Labs    02/10/23 0449 02/10/23 1559 02/11/23 0416  NA 136 139 134*  K 3.9 3.2* 4.8  CL 104 112* 102  CO2 21* 18* 22  GLUCOSE 157* 105* 186*  BUN 53* 30* 23  CREATININE 4.50* 2.63* 2.17*  CALCIUM 7.8* 6.9* 8.6*  MG 2.0  --  2.3  PHOS 3.8 2.2* 2.2*   Recent Labs    02/10/23 0449 02/10/23 1559 02/11/23 0416  AST 14*  --  15  ALT 9  --  9  ALKPHOS 42  --  43  BILITOT 2.7*  --  4.0*  PROT 5.4*  --  6.1*  ALBUMIN 4.2  4.1 4.1 4.6  4.8   Recent Labs    02/10/23 0449 02/11/23 0416  WBC 5.4 4.8  HGB 7.3* 7.6*  HCT 21.7* 23.7*  MCV 100.0 102.6*  PLT 19* 23*   Recent Labs    02/10/23 1615 02/11/23 0416  LABPROT 42.5* 25.0*  INR 4.4* 2.2*      Assessment/Plan: -Acute and chronic kidney injury with worsening creatinine up to 9.  Recent diagnosis of SBP in setting of cirrhosis.  Likely hepatorenal syndrome.  No improvement with Levophed drip and octreotide.  Was started on CRRT today. -Decompensated NASH cirrhosis -MELD score will be artificially elevated because of elevated creatinine. -Ascites complicated by SBP -Esophageal varices   Recommendations -------------------------- -Patient is being transitioned to hospice because of overall poor  prognosis. -GI will sign off.  Call us back if needed.  Discussed with family and pastor at bedside.     Kathi Der MD, FACP 02/11/2023, 2:00 PM  Contact #  [phone]

## 2023-02-11 NOTE — Consult Note (Signed)
  [facility] Liaison Consult Note   02/11/2023  [name] March 25, 1951 [id number]  Insurance: [employer] Medicare   Primary Care Provider: Benita Stabile, MD this provider is listed for the transition of care follow up appointments  and Community TOC calls, as needed   [facility] Liaison screened the patient remotely at [facility]. 9 day LOS, patient in ICU level of care at the time of this review.    The patient was screened for 30 day readmission hospitalization with noted extreme high risk score for unplanned readmission risk 4 hospital admissions in 6 months.  The patient was assessed for potential Community Care Coordination service needs for post hospital transition for care coordination. Review of patient's electronic medical record reveals patient is being recommended for comfort measures.. Patient with ongoing referrals noted.  Plan: [facility] Liaison will continue to follow progress and disposition to asess for post hospital community care coordination/management needs.  Referral request for community care coordination: disposition unknown currently   [facility], Population Health does not replace or interfere with any arrangements made by the Inpatient Transition of Care team.   For questions contact:   Charlesetta Shanks, RN, BSN, CCM [place]  [facility], [facility] Liaison Direct Dial: [phone] or secure chat Email: [email or web address]

## 2023-02-11 NOTE — Plan of Care (Signed)
Overnight, patient has progressively worsening delirium as evidenced by increased confusion to time, place, situation, memory loss, and agitation. Patient refused prescribed oral meds that would have possibly helped her sleep and decrease delirium symptoms (Lactulose, melatonin and zyprexa). During the night, patient called son [name] because she felt she was being held against her will. Able to maintain safety due to 1:1 patient to RN ratio. Patient has often shown signs of pain and verbalized same but refused any medication to decrease pain. During the night, assisted patient to sit on side of the bed as she requested but she demonstrated increased weakness as evidenced by needing two RN's to accomplish this task. Patient refused most oral intake offered other than sips of water or 1-3 bites of jello/applesauce.   Problem: Education: Goal: Knowledge of General Education information will improve Description: Including pain rating scale, medication(s)/side effects and non-pharmacologic comfort measures Outcome: Not Progressing   Problem: Health Behavior/Discharge Planning: Goal: Ability to manage health-related needs will improve Outcome: Not Progressing   Problem: Activity: Goal: Risk for activity intolerance will decrease Outcome: Not Progressing   Problem: Nutrition: Goal: Adequate nutrition will be maintained Outcome: Not Progressing   Problem: Coping: Goal: Level of anxiety will decrease Outcome: Not Progressing   Problem: Pain Management: Goal: General experience of comfort will improve Outcome: Not Progressing   Problem: Skin Integrity: Goal: Risk for impaired skin integrity will decrease Outcome: Not Progressing   Problem: Coping: Goal: Ability to adjust to condition or change in health will improve Outcome: Not Progressing   Problem: Fluid Volume: Goal: Ability to maintain a balanced intake and output will improve Outcome: Not Progressing   Problem: Health  Behavior/Discharge Planning: Goal: Ability to identify and utilize available resources and services will improve Outcome: Not Progressing Goal: Ability to manage health-related needs will improve Outcome: Not Progressing   Problem: Nutritional: Goal: Maintenance of adequate nutrition will improve Outcome: Not Progressing Goal: Progress toward achieving an optimal weight will improve Outcome: Not Progressing   Problem: Skin Integrity: Goal: Risk for impaired skin integrity will decrease Outcome: Not Progressing   Problem: Education: Goal: Knowledge of disease and its progression will improve Outcome: Not Progressing   Problem: Fluid Volume: Goal: Compliance with measures to maintain balanced fluid volume will improve Outcome: Not Progressing   Problem: Health Behavior/Discharge Planning: Goal: Ability to manage health-related needs will improve Outcome: Not Progressing   Problem: Nutritional: Goal: Ability to make healthy dietary choices will improve Outcome: Not Progressing   Problem: Clinical Measurements: Goal: Complications related to the disease process, condition or treatment will be avoided or minimized Outcome: Not Progressing   Problem: Clinical Measurements: Goal: Ability to maintain clinical measurements within normal limits will improve Outcome: Progressing Goal: Will remain free from infection Outcome: Progressing Goal: Diagnostic test results will improve Outcome: Progressing Goal: Respiratory complications will improve Outcome: Progressing Goal: Cardiovascular complication will be avoided Outcome: Progressing   Problem: Elimination: Goal: Will not experience complications related to bowel motility Outcome: Progressing Goal: Will not experience complications related to urinary retention Outcome: Progressing   Problem: Safety: Goal: Ability to remain free from injury will improve Outcome: Progressing   Problem: Metabolic: Goal: Ability to  maintain appropriate glucose levels will improve Outcome: Progressing   Problem: Tissue Perfusion: Goal: Adequacy of tissue perfusion will improve Outcome: Progressing

## 2023-02-11 NOTE — Progress Notes (Signed)
 [facility] Progress Note  Subjective: pt is now DNR, poor candidate for transplant. Plan is for transition to hospice care.   Vitals:   02/11/23 1100 02/11/23 1115 02/11/23 1145 02/11/23 1520  BP: (!) 137/36   (!) 133/53  Pulse: 68     Resp: 14 16 15  (!) 22  Temp: 98.8 F (37.1 C) 98.8 F (37.1 C)    TempSrc:      SpO2: (!) 89%     Weight:      Height:        Exam: Gen: NAD, more alert , interacting some CVS: S1S2, RRR,  RIJ HD cath accessed Resp: normal wob, unlabored, decreased breath sounds bibasilar Abd: Soft, distended, nontender Ext: diffuse 2-3+ bilat pitting edema of LEs and abd wall 1-2+ edema Neuro: looks better today, follows simple commands     Date   Creat  eGFR    2010- 2022  1.12- 2.13    2023   1.24- 1.40 41- 47 ml/min, IIIb    April -may 2024 1.50- 2.82 17- 38 ml/min, IIIb-IV     June 2024  4.09 >> 3.69    October 06, 2022  2.03  26 ml/min, IV     10/13- 01/18/23 2.77 >> 1.89 18- 27 ml/min, IV    02/01/23  7.26    11/06   8.22    11/08   8.14     11/10  8.95     11/12  9.04     UNa 31, UCr 111 - on 11/06    UA - mod LE, neg protein, 6-10 rbcs/ 11-20 epis/ >50 wbcs     I/O = 14L in and 0.3 L UOP = +13.8 L since admit      Renal US - 9.1 / 8.6 cm kidneys w/o hydro   Assessment/ Plan  AKI on CKD 4: baseline creatinine 2.0 from July 2024.  Pt is anuric here in the setting of advanced cirrhosis. Progressive worsening of renal function due most likely to hepatorenal syndrome. Did not respond to levophed and octreotide. Given significant azotemia pt was moved to [facility] and started on CRRT 11/12. Pt is now going to be transitioned to comfort care. CRRT has been dc'd. Will sign off.  NASH cirrhosis: ongoing close management by gastroenterology and has been started on ciprofloxacin for SBP prophylaxis along with lactulose to avoid hepatic encephalopathy.  Poor candidate for transplant.  Volume overload - CRRT stopped as above Anemia: Hb 7- 9 range,  transfuse prn for hgb <7.    Vinson Moselle MD  [facility] 02/11/2023, 4:37 PM  Recent Labs  Lab 02/10/23 0449 02/10/23 1559 02/11/23 0416  HGB 7.3*  --  7.6*  ALBUMIN 4.2  4.1 4.1 4.6  4.8  CALCIUM 7.8* 6.9* 8.6*  PHOS 3.8 2.2* 2.2*  CREATININE 4.50* 2.63* 2.17*  K 3.9 3.2* 4.8   No results for input(s): "IRON", "TIBC", "FERRITIN" in the last 168 hours. Inpatient medications:  Chlorhexidine Gluconate Cloth  6 each Topical Daily   lactulose  10 g Oral BID   lidocaine  1 patch Transdermal Q24H   liver oil-zinc oxide   Topical TID   nystatin  1 Application Topical TID    norepinephrine (LEVOPHED) Adult infusion 2 mcg/min (02/11/23 1338)   acetaminophen **OR** acetaminophen, diphenhydrAMINE, fentaNYL (SUBLIMAZE) injection, glycopyrrolate **OR** glycopyrrolate **OR** glycopyrrolate, haloperidol lactate, lidocaine, midazolam, nystatin, ondansetron **OR** ondansetron (ZOFRAN) IV, oxyCODONE, polyvinyl alcohol

## 2023-02-11 NOTE — Progress Notes (Signed)
PMT brief progress note.   Patient seen, sister and son at bedside, chart reviewed, patient noted to be awake alert resting in bed, no distress. Monitor noted.  BP 127/72   Pulse 68   Temp 98.8 F (37.1 C)   Resp 14   Ht 5\' 6"  (1.676 m)   Wt 97.5 kg   SpO2 (!) 89%   BMI 34.69 kg/m  TOC note reviewed, discussed with family at bedside, hospice organization consulted for transfer to residential hospice towards the end of this hospitalization.  Medication history noted.  Continue comfort-focused care and plan for residential hospice on discharge.  Low MDM Rosalin Hawking MD [facility].

## 2023-02-11 NOTE — Progress Notes (Signed)
NAME:  [name], MRN:  [id number], DOB:  January 12, 1951, LOS: 9 ADMISSION DATE:  02/02/2023, CONSULTATION DATE:  02/08/2023 REFERRING MD:  [place] [facility] CHIEF COMPLAINT:  Multiorgan failure   History of Present Illness:  72 year old woman who presented to [facility] 11/11 as a transfer from [facility] for multisystem organ failure. PMHx significant for HTN, HLD, T2DM, GERD, anemia of chronic disease, chronic thrombocytopenia, hypothyroidism, depression, CKD stage IV, NASH cirrhosis with portal HTN as evidenced nonbleeding EV and ascites requiring frequent paracenteses (c/b SBP 12/2022).  Patient was initially admitted to [facility] 02/02/23 after she was noted to have abnormal labs at GI clinic (Cr 7.26 and worsening metabolic acidosis). Recent admission for hepatic encephalopathy and AKI 3 weeks PTA. Patient has required multiple paracenteses, last 02/01/2023. She was found to have AKI on CKD with concern for hepatorenal syndrome, hypotension, UTI and was subsequently admitted for further workup. Completed antibiotic course for UTI. Unfortunately, despite appropriate supportive treatment for HRS with Albumin, IVF, Octreotide and Norepinephrine, her renal function continued to worsen along with ongoing anuria. It was felt that she should be transferred to [facility] or [facility] for initiation of CRRT vs HD. She has been seen by GI at [facility] who recommends Octreotide, Levophed, Albumin, Cipro and possible repeat paracentesis if she accumulates more fluid (she had a paracentesis on prior admission and was treated with CTX for SBP).  Transferred to [facility] 11/11 for ongoing management to include CRRT vs HD.  Pertinent Medical History:   Past Medical History:  Diagnosis Date   Alkaline phosphatase elevation    Anemia    Anemia in stage 4 chronic kidney disease (HCC) 10/15/2022   ASD (atrial septal defect)    Cataract    both eyes hx of   Chest pain    03-17-2013 last chest pain   Chronic kidney disease    Stage III kidney disease    Cirrhosis (HCC)    Depression    Diabetes mellitus type II    Family history of adverse reaction to anesthesia    Son hard to wake up   Fatigue    GERD (gastroesophageal reflux disease)    Gout    Headache(784.0)    occasional   Hyperlipidemia    Hypertension    Hypothyroidism    Neuropathy    Osteopenia    Peripheral neuropathy    Presence of pessary    Retinopathy    Significant Hospital Events: Including procedures, antibiotic start and stop dates in addition to other pertinent events   11/5 - Presented to [facility] after abnormal labs noted at GI clinic visit (Cr 7, metabolic acidosis). Admitted for multi-organ system dysfunction. Started on treatment for presumed HRS. 11/11 - Transferred to [facility] for higher level of care/CRRT initiation. Nephro consulted. L internal jugular Trialysis catheter placed for dialysis access. Remains on NE, octreotide. 11/12 feels like OT over did it yesterday and it was too much for her 11/13 NE to HRS MAP goal 80. Talked to [facility], her candidacy for [facility] is not great -- would only be a candidate for eval in the outpt setting.  11/14 GOC talks   Interim History / Subjective:  Na, Phos low  Tbili high, INR up  to 4.4 overnight back down to 2.2 Plt still low at 23 and hgb 7.6   MELD Na 41 (with INR 4.4) or 35 (with INR 2.2) --regardless, worse despite Cr 2.14 from 4.5  Objective:  Blood pressure (!) 168/48, pulse 67, temperature 98.6 F (  37 C), resp. rate (!) 23, height 5\' 6"  (1.676 m), weight 97.5 kg, SpO2 95%. CVP:  [9 mmHg-37 mmHg] 11 mmHg      Intake/Output Summary (Last 24 hours) at 02/11/2023 0920 Last data filed at 02/11/2023 0900 Gross per 24 hour  Intake 1513.71 ml  Output 4275.9 ml  Net -2762.19 ml   Filed Weights   02/08/23 0221 02/10/23 0500 02/11/23 0413  Weight: 100.9 kg 100.5 kg 97.5 kg   Physical Examination: General:  HEENT:  Neuro:  CV:  PULM:  GI:  Extremities: Skin:   Assessment & Plan:    GOC DNR discussion   -this admission revoked previous DNR and is now full code -I have spoken with [facility] txp hepatology 11/13 re this pt (who was under the impression based on prior talks that if she acutely regained some strength the plan was for urgent/emergent SLK at [facility]) and confirmed my suspicion that her only path for txp consideration would be in the outpatient setting -- meaning that she would have to 1) survive this illness, 2) rehabilitation a functional status much greater than her PTA functional status, 3) be able to present in the outpt setting to start evaluations.  -the above is not realistic given her PTA status, her acute illness course. I shared this with the pt 11/13 afternoon which was received with shock and a variable degree of acceptance vs denial. Son [name] was very realistic when we spoke 11/13  -She is continuing to decline; worse MELD, worse AMS, worse vasoplegia   11/14 I did talk w her about hospice, DNR and she was agreeable and could articulate appropriate understanding of her illness / what this shft would mean, then started hallucinating bugs on the ceiling so I haven't made any changes and really just need to talk with family when they arrive    Acute encephalopathy -uremia improved. Possible hyperammonemia. Delirium. Hypoperfusion  P -CRRT -delirium precautions -supportive care    AKI on CKD HRS Hyponatremia  -uremia improved P -NE for MAP goal 80  -octreotide  -albumin  -added midodrine 11/13 but think low utility in grand scheme  -nephro following for CRRT, they are in agreement with GOC talks given poor prognosis   Decompensated NASH cirrhosis with ascites Hx SBP Hx HE, hyperammonemia Elevated LFTs  Hyperbilirubinemia  -see 11/13 notes re my conversation with [facility] for clarification re her candidacy for [facility] -- this is simply not a realistic destination.  P -Gi following, are in agreement with GOC talks given poor prognosis  -Cipro -lactulose -supportive  care  Anemia Coagulopathy Thrombocytopenia  -in setting of above processes  -Plt and hgb cont to decline, INR cont to incr despite Vit K P -continues to get Vit K   Hx HTN, HLD  - Hold PTA Inderal, torsemide  Dm2  - Basal Levemir 6U QHS - SSI  Hypothyroidism - Continue Synthroid  Best practice (evaluated daily):   Diet/type: Regular consistency (see orders) DVT prophylaxis: SCD GI prophylaxis: N/A Lines: Central line Foley:  Yes, and it is still needed Code Status:  full code Last date of multidisciplinary goals of care discussion: 11/13  Critical care time:   CRITICAL CARE Performed by: Lanier Clam   Total critical care time: 44 minutes  Critical care time was exclusive of separately billable procedures and treating other patients. Critical care was necessary to treat or prevent imminent or life-threatening deterioration.  Critical care was time spent personally by me on the following activities: development of  treatment plan with patient and/or surrogate as well as nursing, discussions with consultants, evaluation of patient's response to treatment, examination of patient, obtaining history from patient or surrogate, ordering and performing treatments and interventions, ordering and review of laboratory studies, ordering and review of radiographic studies, pulse oximetry and re-evaluation of patient's condition.  Tessie Fass MSN, AGACNP-BC  Pulmonary/Critical Care Medicine [place] for pager 02/11/2023, 9:20 AM

## 2023-02-11 NOTE — Discharge Summary (Shared)
Physician Discharge Summary  Patient ID: [name] MRN: [id number] DOB/AGE: Oct 03, 1950 72 y.o.  Admit date: 02/02/2023 Discharge date: 02/11/2023  Problem List Principal Problem:   Hepatorenal syndrome (HCC) Active Problems:   Acquired hypothyroidism   Type 2 diabetes mellitus with diabetic chronic kidney disease (HCC)   Liver cirrhosis secondary to NASH (HCC)   Severe Thrombocytopenia   AKI (acute kidney injury) (HCC)   Anemia due to chronic kidney disease   UTI (urinary tract infection)   Hypotension   Poor appetite   Coagulopathy (HCC)   Goals of care, counseling/discussion   DNR (do not resuscitate) discussion   DNR (do not resuscitate)  HPI: 72 yo F PMH including but not limited to NASH cirrhosis with recurrent ascites, esophageal varices, portal hl hypertensive gastropathy, SBP infection, CKD IV, hypothyroidism, frequent falls, thrombocytopenia, anemia who was admitted to [facility] hospital 02/02/23 after abnormal labs were noted output -- Cr 7.26 and associated metabolic acidosis. Admitted to hospitalist, GI and nephro subsequently consulted.  She was started on supportive tx for hepatorenal syndrome + tx with abx for UTI.  Unfortunately she failed to improve, and on 11/11 was transferred to [facility] for CRRT and ongoing supportive care.    Hospital Course:  11/5- admit to [facility] after outpt labs revealed Cr 7.26. Started on abx for UTI  11/7- Nephrology and GI were consulted, with concern for HRS and decompensated cirrhosis. This included IVF, NE,  octreotide, albumin as well as cipro and lactulose  11/8 rocephin for UTI completed. Dx para. Topic of txp broached; she was unsure if she wanted to consider eval. Nephro discussed short term dialysis, noted poor long-term candidate. Transfused 1 PRBC 11/9 still unsure re txp eval  11/10 anuric. Still receiving HRS targeted care, without improvement. Pt opted for case to be d/w abd txp at [facility] -- there was indication of need for functional  status improvement but possible candidate for eval otherwise.  11/11 decision to transfer to [facility] for Temp HD placement (unable to be done at [facility]) and CRRT. PT eval, extremely weak. Arrives overnight and PCCM consulted, HD cath placed 11/12 CRRT started. Albumin incr. NE continued. Vit K scheduled.     11/13 PCCM discussed case with [facility] abd txp given pts belief that if she acutely improved a bit, she would be transferred to [facility] for SLK eval. This is not the case; her potential for candidacy eval would only be in the outpt setting, thus she would need to survive this course and improve to a greater than PTA baseline functional status.  This was shocking news to the patient. D/w son who understands and we discussed plans to discuss GOC tomorrow.  11/14 GOC discussed w pt, then pt+ son + daughter -- decision reached to pursue hospice. De-escalating ICU measures while we investigate dispo options. Do think that she may be a residential candidate. TOC consulted.  11/15 transfer to palliative floor, awaiting hospice bed   Exam at discharge *** Gen Neuro HEENT Pulm  CV GI GU MSK Skin    Plan at discharge   Goals of Care // encounter for palliative care DNR  Decompensated NASH cirrhosis with ascites AKI on CKD ; HRS Hyponatremia  Coagulopathy Hyperbilirubinemia Thrombocytopenia Elevated LFTs Anemia Acute encephalopathy  DM2 Hypothyroidism P -residential hospice  -d/w family what sx might be expected at EOL with liver and kidney failure and how these would be managed        Labs at discharge Lab Results  Component Value  Date   CREATININE 2.17 (H) 02/11/2023   BUN 23 02/11/2023   NA 134 (L) 02/11/2023   K 4.8 02/11/2023   CL 102 02/11/2023   CO2 22 02/11/2023   Lab Results  Component Value Date   WBC 4.8 02/11/2023   HGB 7.6 (L) 02/11/2023   HCT 23.7 (L) 02/11/2023   MCV 102.6 (H) 02/11/2023   PLT 23 (LL) 02/11/2023   Lab Results  Component Value Date   ALT 9  02/11/2023   AST 15 02/11/2023   ALKPHOS 43 02/11/2023   BILITOT 4.0 (H) 02/11/2023   Lab Results  Component Value Date   INR 2.2 (H) 02/11/2023   INR 4.4 (HH) 02/10/2023   INR 2.3 (H) 02/10/2023    Current radiology studies No results found.  Disposition:   There are no questions and answers to display.         Allergies as of 02/11/2023       Reactions   Bactrim [sulfamethoxazole-trimethoprim] Nausea And Vomiting, Other (See Comments)   Dizziness   Cipro [ciprofloxacin Hcl] Other (See Comments)   Severe yeast infection   Codeine Other (See Comments)   Unknown reaction   Cozaar [losartan Potassium] Cough   Lipitor [atorvastatin] Other (See Comments)   Arthralgias Myalgias   Mevacor [lovastatin] Other (See Comments)   Arthralgias  Myalgias   Pravachol [pravastatin] Other (See Comments)   Arthralgias  Myalgias    Rosuvastatin Diarrhea, Other (See Comments)   Arthralgias Myalgias   Statins Other (See Comments)   Arthralgias Myalgias   Welchol [colesevelam] Other (See Comments)   Arthralgias Myalgias    Zestril [lisinopril] Cough   Zetia [ezetimibe] Other (See Comments)   Arthralgias Myalgias    Dilaudid [hydromorphone] Nausea And Vomiting, Other (See Comments)   GI Intolerance     Med Rec must be completed prior to using this SMARTLINK***         Discharged Condition: poor  Time spent on discharge <30 min   Vital signs at Discharge. Temp:  [98.1 F (36.7 C)-99 F (37.2 C)] 98.8 F (37.1 C) (11/14 1115) Pulse Rate:  [57-88] 68 (11/14 1100) Resp:  [11-26] 15 (11/14 1145) BP: (107-182)/(18-114) 137/36 (11/14 1100) SpO2:  [66 %-100 %] 89 % (11/14 1100) Weight:  [97.5 kg] 97.5 kg (11/14 0413) Office follow up Special Information or instructions. Signed: @SMSIG @

## 2023-02-11 NOTE — Progress Notes (Addendum)
eLink Physician-Brief Progress Note Patient Name: [name] DOB: [birth year] MRN: [id number]   Date of Service  02/11/2023  HPI/Events of Note  Patient with agitated delirium, she started to get  out of bed and almost disconnected her dialysis line. RN reports that patient has not slept in 3 days.  eICU Interventions  Zyprexa Zydis 5 mg sub-lingual  x 1, Melatonin 3 mg po x 1.        [name] 02/11/2023, 2:22 AM

## 2023-02-11 NOTE — IPAL (Signed)
  Interdisciplinary Goals of Care Family Meeting   Date carried out: 02/11/2023  Location of the meeting: Bedside  Member's involved: Nurse Practitioner, Bedside Registered Nurse, and Family Member or next of kin  Durable Power of Attorney or acting medical decision maker: Son [name]    Discussion: We discussed goals of care for [employer] . Met at bedside with son [name], pt, Pt daughter [name] via speaker phone, and pt son's MIL.  Pt and son are understanding that the pt is nearing EOL, and that there is not a path for recovery. They agree with recommendation for hospice care.  After a lengthy discussion with [name], we were able to clarify how she is now as well as how sick and debilitated Telesa was PTA. We talked about the reality of txp candidacy. We clarified that Rozella's waxing/waning mental status was not r/t "being all doped up" but instead her kidney/liver dysfxn + delirium. She is frustrated that hope was given regarding emergent transplant at [facility], which I could not directly speak to but provided emotional support about and reiterated the current situation and limitations. We further clarified why simply pursuing a para is not going to resolve the current problems.  Ultimately, [name] was able to understand the grim prognosis at hand and is accepting of recommendation for hospice.   I will place [facility] consult for residential hospice. I think she is realistically a better candidate for residential instead of home. She is on pressors currently, but this was for HRS related MAP goal (75-80) not for shock, and do not think she would be too unstable for transport, especially if we are able to secure a bed soon.    Palliative care is to see today as well   I have dc labs, some orders. Still on CRRT low dose NE while we await dispo. Do think  dc CRRT would be fine -- line is itching and would be great to get it out, and like above pressors, do not think that ceasing CRRT will cause imminent  decompensation a  Code status:   Code Status: Do not attempt resuscitation (DNR) - Comfort care   Disposition: Home with Hospice vs hospice house   Time spent for the meeting:     Lanier Clam, NP  02/11/2023, 10:54 AM

## 2023-02-11 NOTE — Plan of Care (Signed)
  Problem: Nutritional: Goal: Maintenance of adequate nutrition will improve Outcome: Not Progressing   Problem: Skin Integrity: Goal: Risk for impaired skin integrity will decrease Outcome: Not Progressing   Problem: Tissue Perfusion: Goal: Adequacy of tissue perfusion will improve Outcome: Not Progressing   Problem: Education: Goal: Knowledge of disease and its progression will improve Outcome: Not Progressing   Problem: Nutritional: Goal: Ability to make healthy dietary choices will improve Outcome: Not Progressing   Problem: Clinical Measurements: Goal: Complications related to the disease process, condition or treatment will be avoided or minimized Outcome: Not Progressing

## 2023-02-11 NOTE — TOC Initial Note (Addendum)
Transition of Care [facility]) - Initial/Assessment Note    Patient Details  Name: [name] MRN: [id number] Date of Birth: 06/20/1950  Transition [facility]) CM/SW Contact:    Lavenia Atlas, RN Phone Number: 02/11/2023, 3:50 PM  Clinical Narrative:     Patient currently in [facility] ICU with hepatorenal syndrome. Received TOC consult for residential hospice services. This RNCM spoke with patient's son and daughter at bedside to offer residential hospice choice ([email or web address]). Family chose [facility] [place] [facility] [place]. Patient's son [name] reports the patient has received services from [place] for home hospice and outpatient palliative services in the past.  This RNCM made residential hospice referral to [facility] with [facility]. The medical director with [facility] will review and make decision tomorrow morning. Rae Halsted will follow up tomorrow am. Notified patient's family, MD, NP and RN.  TOC will continue to follow for discharge needs.                 Expected Discharge Plan: Hospice Medical Facility Barriers to Discharge: Continued Medical Work up   Patient Goals and CMS Choice Patient states their goals for this hospitalization and ongoing recovery are:: residential hospice services CMS Medicare.gov Compare Post Acute Care list provided to:: Patient Represenative (must comment) (Son: [name] and daughter [name]) Choice offered to / list presented to : Adult Children [place] ownership interest in [facility].provided to:: Adult Children    Expected Discharge Plan and Services In-house Referral: NA Discharge Planning Services: CM Consult Post Acute Care Choice: Hospice Living arrangements for the past 2 months: Single Family Home                 DME Arranged: N/A DME Agency: NA       HH Arranged: NA HH Agency: NA        Prior Living Arrangements/Services Living arrangements for the past 2 months: Single Family Home Lives with:: Relatives Patient language and need for interpreter  reviewed:: Yes Do you feel safe going back to the place where you live?: Yes      Need for Family Participation in Patient Care: Yes (Comment) Care giver support system in place?: Yes (comment) Current home services: DME (DME: walker, rollator, wheelchair, BSC, hospital bed) Criminal Activity/Legal Involvement Pertinent to Current Situation/Hospitalization: No - Comment as needed  Activities of Daily Living   ADL Screening (condition at time of admission) Independently performs ADLs?: No Does the patient have a NEW difficulty with bathing/dressing/toileting/self-feeding that is expected to last >3 days?: No Does the patient have a NEW difficulty with getting in/out of bed, walking, or climbing stairs that is expected to last >3 days?: No Does the patient have a NEW difficulty with communication that is expected to last >3 days?: No Is the patient deaf or have difficulty hearing?: Yes Does the patient have difficulty seeing, even when wearing glasses/contacts?: No Does the patient have difficulty concentrating, remembering, or making decisions?: No  Permission Sought/Granted Permission sought to share information with : Case Manager Permission granted to share information with : Yes, Verbal Permission Granted  Share Information with NAME: Case manager           Emotional Assessment Appearance:: Appears stated age Attitude/Demeanor/Rapport: Unable to Assess Affect (typically observed): Unable to Assess Orientation: : Oriented to Self Alcohol / Substance Use: Not Applicable Psych Involvement: No (comment)  Admission diagnosis:  AKI (acute kidney injury) [facility]) [N17.9] Patient Active Problem List   Diagnosis Date Noted   DNR (do not resuscitate) 02/11/2023  Coagulopathy (HCC) 02/10/2023   Goals of care, counseling/discussion 02/10/2023   DNR (do not resuscitate) discussion 02/10/2023   Hepatorenal syndrome (HCC) 02/07/2023   Poor appetite 02/07/2023   AKI (acute kidney injury)  (HCC) 02/02/2023   Anemia due to chronic kidney disease 02/02/2023   UTI (urinary tract infection) 02/02/2023   Hypotension 02/02/2023   Esophageal varices (HCC) 01/28/2023   Hepatic encephalopathy syndrome (HCC) 01/17/2023   Pressure injury of skin 01/15/2023   SBP (spontaneous bacterial peritonitis) (HCC) 01/14/2023   Encephalopathy, hepatic (HCC) 01/14/2023   Acute hepatic encephalopathy (HCC) 01/10/2023   Ascites 01/10/2023   Anemia in stage 4 chronic kidney disease (HCC) 10/15/2022   Complete prolapse of vaginal vault 09/15/2022   Urinary retention 09/15/2022   Vaginal pessary present 09/15/2022   Elevated brain natriuretic peptide (BNP) level 09/03/2022   Hypoalbuminemia due to protein-calorie malnutrition (HCC) 09/03/2022   Prolonged QT interval 08/25/2022   Vaginal prolapse 08/25/2022   Acute kidney injury superimposed on stage 4 chronic kidney disease (HCC) 08/25/2022   Obesity 06/16/2022   Cirrhosis, non-alcoholic (HCC) 12/04/2020   Vitamin D deficiency 11/26/2020   Mixed hyperlipidemia 11/26/2020   Gout 11/26/2020   History of thrombocytopenia 11/26/2020   Severe Thrombocytopenia 07/30/2017   CKD stage 3 due to type 2 diabetes mellitus (HCC) 06/30/2017   Liver cirrhosis secondary to NASH (HCC) 06/30/2017   Hypertension with renal disease 03/19/2017   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/19/2017   Tremor 08/06/2016   Vitreous hemorrhage of left eye (HCC) 01/29/2015   Diabetic macular edema (HCC) 12/25/2014   Hypertensive retinopathy of both eyes 12/25/2014   Proliferative diabetic retinopathy (HCC) 08/04/2011   Bilateral nondiabetic proliferative retinopathy 04/03/2011   Type 2 diabetes mellitus with diabetic chronic kidney disease (HCC) 02/05/2010   Acquired hypothyroidism 03/20/2008   RENAL DISEASE, CHRONIC, STAGE II 03/20/2008   Hereditary and idiopathic peripheral neuropathy 02/21/2008   Pseudophakia of both eyes 02/21/2008   GERD 02/21/2008    PCP:  Benita Stabile, MD Pharmacy:   [facility] [address] [place] [address] [place] [address] [place] [address] Phone: [phone] Fax: [phone]     Social Determinants of Health (SDOH) Social History: SDOH Screenings   Food Insecurity: No Food Insecurity (02/06/2023)  Housing: Low Risk  (02/06/2023)  Transportation Needs: No Transportation Needs (02/06/2023)  Utilities: Not At Risk (02/06/2023)  Alcohol Screen: Low Risk  (10/30/2019)  Depression (PHQ2-9): Medium Risk (10/30/2019)  Financial Resource Strain: Medium Risk (10/30/2019)  Physical Activity: Inactive (10/30/2019)  Social Connections: Moderately Integrated (10/30/2019)  Stress: No Stress Concern Present (10/30/2019)  Tobacco Use: Low Risk  (02/08/2023)   SDOH Interventions:     Readmission Risk Interventions    02/11/2023    3:37 PM 02/03/2023    2:33 PM 01/11/2023    8:51 AM  Readmission Risk Prevention Plan  Transportation Screening Complete Complete Complete  HRI or Home Care Consult  Complete Complete  Social Work Consult for Recovery Care Planning/Counseling  Complete Complete  Palliative Care Screening  Not Applicable Not Applicable  Medication Review Oceanographer) Complete Complete Complete  PCP or Specialist appointment within 3-5 days of discharge Complete    HRI or Home Care Consult Complete    SW Recovery Care/Counseling Consult Complete    Palliative Care Screening Complete    Skilled Nursing Facility Not Applicable

## 2023-02-12 DIAGNOSIS — Z66 Do not resuscitate: Secondary | ICD-10-CM | POA: Diagnosis not present

## 2023-02-12 DIAGNOSIS — Z515 Encounter for palliative care: Secondary | ICD-10-CM

## 2023-02-12 MED ORDER — MIDAZOLAM HCL 2 MG/2ML IJ SOLN: 2.0000 mg | INTRAMUSCULAR | Status: DC | PRN

## 2023-02-12 NOTE — Progress Notes (Signed)
72 yo F decomp NASH cirrhosis w ascites, HRS who was transitioned to comfort care yesterday with hope of hospice dc.  Seen this morning Asleep, comfortable appearing More jaundiced.  R CVC still in place, L TDC removed Abd is round    No med changes needed this morning; will cont to follow up throughout the day to ensure optimal sx mgmnt.  Awaiting word re hospice placement. Possible dc today. If remains inpt, more formal progress note to follow     Tessie Fass MSN, AGACNP-BC [facility] Pulmonary/Critical Care Medicine 02/12/2023, 9:34 AM

## 2023-02-12 NOTE — Progress Notes (Signed)
NAME:  [name], MRN:  [id number], DOB:  02/29/1951, LOS: 10 ADMISSION DATE:  02/02/2023, CONSULTATION DATE:  02/08/2023 REFERRING MD:  [place] [facility] CHIEF COMPLAINT:  Multiorgan failure   History of Present Illness:  72 year old woman who presented to [facility] 11/11 as a transfer from [facility] for multisystem organ failure. PMHx significant for HTN, HLD, T2DM, GERD, anemia of chronic disease, chronic thrombocytopenia, hypothyroidism, depression, CKD stage IV, NASH cirrhosis with portal HTN as evidenced nonbleeding EV and ascites requiring frequent paracenteses (c/b SBP 12/2022).  Patient was initially admitted to [facility] 02/02/23 after she was noted to have abnormal labs at GI clinic (Cr 7.26 and worsening metabolic acidosis). Recent admission for hepatic encephalopathy and AKI 3 weeks PTA. Patient has required multiple paracenteses, last 02/01/2023. She was found to have AKI on CKD with concern for hepatorenal syndrome, hypotension, UTI and was subsequently admitted for further workup. Completed antibiotic course for UTI. Unfortunately, despite appropriate supportive treatment for HRS with Albumin, IVF, Octreotide and Norepinephrine, her renal function continued to worsen along with ongoing anuria. It was felt that she should be transferred to [facility] or [facility] for initiation of CRRT vs HD. She has been seen by GI at [facility] who recommends Octreotide, Levophed, Albumin, Cipro and possible repeat paracentesis if she accumulates more fluid (she had a paracentesis on prior admission and was treated with CTX for SBP).  Transferred to [facility] 11/11 for ongoing management to include CRRT vs HD.  Pertinent Medical History:   Past Medical History:  Diagnosis Date   Alkaline phosphatase elevation    Anemia    Anemia in stage 4 chronic kidney disease (HCC) 10/15/2022   ASD (atrial septal defect)    Cataract    both eyes hx of   Chest pain    03-17-2013 last chest pain   Chronic kidney disease    Stage III kidney disease    Cirrhosis (HCC)    Depression    Diabetes mellitus type II    Family history of adverse reaction to anesthesia    Son hard to wake up   Fatigue    GERD (gastroesophageal reflux disease)    Gout    Headache(784.0)    occasional   Hyperlipidemia    Hypertension    Hypothyroidism    Neuropathy    Osteopenia    Peripheral neuropathy    Presence of pessary    Retinopathy    Significant Hospital Events: Including procedures, antibiotic start and stop dates in addition to other pertinent events   11/5 - Presented to [facility] after abnormal labs noted at GI clinic visit (Cr 7, metabolic acidosis). Admitted for multi-organ system dysfunction. Started on treatment for presumed HRS. 11/11 - Transferred to [facility] for higher level of care/CRRT initiation. Nephro consulted. L internal jugular Trialysis catheter placed for dialysis access. Remains on NE, octreotide. 11/12 feels like OT over did it yesterday and it was too much for her 11/13 NE to HRS MAP goal 80. Talked to [facility], her candidacy for [facility] is not great -- would only be a candidate for eval in the outpt setting.  11/14 GOC talks --> comfort care 11/15 txf out of ICU, awaiting hospice bed   Interim History / Subjective:  Transitioned to comfort care yesterday  D/w RN -- unlikely to be placed at hospice facility today   Objective:  Blood pressure 127/72, pulse 69, temperature 98.8 F (37.1 C), resp. rate 14, height 5\' 6"  (1.676 m), weight 97.5 kg, SpO2 (!) 89%.  Intake/Output Summary (Last 24 hours) at 02/12/2023 1225 Last data filed at 02/12/2023 1000 Gross per 24 hour  Intake 251.24 ml  Output 398 ml  Net -146.76 ml   Filed Weights   02/08/23 0221 02/10/23 0500 02/11/23 0413  Weight: 100.9 kg 100.5 kg 97.5 kg   Physical Examination: General: ill appearing elderly F asleep  HEENT: NCAT  Neuro: asleep  CV: rr  PULM: even and unlabored  GI: round  Extremities: no acute joint deformity  Skin: a bit jaundiced, scattered  petechial hemorrhaging and ecchymosis   Assessment & Plan:   GOC Encounter for palliative care DNR  Acute encephalopathy AKI on CKD HRS Hyponatremia  Decompensated NASH cirrhosis with ascites Hx SBP Hx HE, hyperammonemia Elevated LFTs  Hyperbilirubinemia  Anemia Coagulopathy Thrombocytopenia  Hx HTN, HLD  Dm2  Hypothyroidism P -poor prognosis and functional status precludes [facility] candidacy, which would be outpt only.  -with knowledge of this, transitioned to comfort care + DNR 11/14, with goal of residential hospice -TOC is following for placement -cont PRNs for comfort -no labs, imaging, interventions not aimed at comfort -will transfer out of ICU 11/15 to palliative bed while awaiting placement    Best practice (evaluated daily):   Diet/type: Regular consistency (see orders) DVT prophylaxis: not indicated GI prophylaxis: N/A Lines: Central line Foley:  N/A Code Status:  DNR Last date of multidisciplinary goals of care discussion: 11/14  Critical care time:   CCT n/a  Low MDM  Tessie Fass MSN, AGACNP-BC [facility] Pulmonary/Critical Care Medicine Amion for pager  02/12/2023, 12:25 PM

## 2023-02-12 NOTE — Progress Notes (Signed)
PMT no charge note.   Chart reviewed, discussed with RN colleague, TOC note reviewed, [facility] reporting medical director has approved now awaiting hospice bed availability.  Continue current care.  No charge Rosalin Hawking MD [facility] palliative.

## 2023-02-12 NOTE — Progress Notes (Signed)
   02/12/23 1400  Spiritual Encounters  Type of Visit Initial  Care provided to: Pt and family  Referral source Family;Chaplain assessment;Chaplain team  Reason for visit End-of-life  OnCall Visit No  Spiritual Framework  Presenting Themes Meaning/purpose/sources of inspiration;Goals in life/care;Values and beliefs;Significant life change;Coping tools;Impactful experiences and emotions;Courage hope and growth;Rituals and practive;Community and relationships  Values/beliefs Christian Faith  Community/Connection Family;Faith community;Spiritual leader  Patient Stress Factors Loss;Loss of control;Major life changes  Family Stress Factors Loss of control;Major life changes   Chaplain met with patient at bedside today.  Son and Optician, dispensing were present at bedside.  Patient was sitting up and alert- talking and introducing others to the Chaplain. Provided spiritual care and comfort and reassurance to patient and son at bedside.

## 2023-02-12 NOTE — TOC Progression Note (Signed)
Transition of Care [facility]) - Progression Note    Patient Details  Name: [name] MRN: [id number] Date of Birth: 1950/04/30  [facility]) CM/SW Contact  Lavenia Atlas, RN Phone Number: 02/12/2023, 12:49 PM  Clinical Narrative:   Received message from [facility] reporting medical director has approved however awaiting hospice bed availability.  TOC will continue to follow for discharge needs.    Expected Discharge Plan: Hospice Medical Facility Barriers to Discharge: Continued Medical Work up  Expected Discharge Plan and Services In-house Referral: NA Discharge Planning Services: CM Consult Post Acute Care Choice: Hospice Living arrangements for the past 2 months: Single Family Home                 DME Arranged: N/A DME Agency: NA       HH Arranged: NA HH Agency: NA         Social Determinants of Health (SDOH) Interventions SDOH Screenings   Food Insecurity: No Food Insecurity (02/06/2023)  Housing: Low Risk  (02/06/2023)  Transportation Needs: No Transportation Needs (02/06/2023)  Utilities: Not At Risk (02/06/2023)  Alcohol Screen: Low Risk  (10/30/2019)  Depression (PHQ2-9): Medium Risk (10/30/2019)  Financial Resource Strain: Medium Risk (10/30/2019)  Physical Activity: Inactive (10/30/2019)  Social Connections: Moderately Integrated (10/30/2019)  Stress: No Stress Concern Present (10/30/2019)  Tobacco Use: Low Risk  (02/08/2023)    Readmission Risk Interventions    02/11/2023    3:37 PM 02/03/2023    2:33 PM 01/11/2023    8:51 AM  Readmission Risk Prevention Plan  Transportation Screening Complete Complete Complete  HRI or Home Care Consult  Complete Complete  Social Work Consult for Recovery Care Planning/Counseling  Complete Complete  Palliative Care Screening  Not Applicable Not Applicable  Medication Review Oceanographer) Complete Complete Complete  PCP or Specialist appointment within 3-5 days of discharge Complete    HRI or Home Care  Consult Complete    SW Recovery Care/Counseling Consult Complete    Palliative Care Screening Complete    Skilled Nursing Facility Not Applicable

## 2023-02-12 NOTE — Care Management Important Message (Signed)
Important Message  Patient Details  Name: [name] MRN: [id number] Date of Birth: October 29, 1950   Important Message Given:  Yes - Medicare IM     Corey Harold 02/12/2023, 4:08 PM

## 2023-02-12 NOTE — Plan of Care (Signed)
  Problem: Education: Goal: Knowledge of General Education information will improve Description: Including pain rating scale, medication(s)/side effects and non-pharmacologic comfort measures Outcome: Progressing   Problem: Health Behavior/Discharge Planning: Goal: Ability to manage health-related needs will improve Outcome: Progressing   Problem: Clinical Measurements: Goal: Will remain free from infection Outcome: Progressing Goal: Diagnostic test results will improve Outcome: Progressing Goal: Respiratory complications will improve Outcome: Progressing Goal: Cardiovascular complication will be avoided Outcome: Progressing   Problem: Activity: Goal: Risk for activity intolerance will decrease Outcome: Progressing   Problem: Nutrition: Goal: Adequate nutrition will be maintained Outcome: Progressing   Problem: Elimination: Goal: Will not experience complications related to bowel motility Outcome: Progressing   Problem: Safety: Goal: Ability to remain free from injury will improve Outcome: Progressing   Problem: Skin Integrity: Goal: Risk for impaired skin integrity will decrease Outcome: Progressing   Problem: Coping: Goal: Ability to adjust to condition or change in health will improve Outcome: Progressing   Problem: Fluid Volume: Goal: Ability to maintain a balanced intake and output will improve Outcome: Progressing   Problem: Health Behavior/Discharge Planning: Goal: Ability to identify and utilize available resources and services will improve Outcome: Progressing Goal: Ability to manage health-related needs will improve Outcome: Progressing   Problem: Metabolic: Goal: Ability to maintain appropriate glucose levels will improve Outcome: Progressing   Problem: Nutritional: Goal: Maintenance of adequate nutrition will improve Outcome: Progressing Goal: Progress toward achieving an optimal weight will improve Outcome: Progressing   Problem: Tissue  Perfusion: Goal: Adequacy of tissue perfusion will improve Outcome: Progressing   Problem: Education: Goal: Knowledge of disease and its progression will improve Outcome: Progressing Goal: Individualized Educational Video(s) Outcome: Progressing   Problem: Fluid Volume: Goal: Compliance with measures to maintain balanced fluid volume will improve Outcome: Progressing   Problem: Health Behavior/Discharge Planning: Goal: Ability to manage health-related needs will improve Outcome: Progressing   Problem: Nutritional: Goal: Ability to make healthy dietary choices will improve Outcome: Progressing   Problem: Clinical Measurements: Goal: Complications related to the disease process, condition or treatment will be avoided or minimized Outcome: Progressing

## 2023-02-12 NOTE — Progress Notes (Signed)
Nutrition Brief Note  Chart reviewed. Pt now transitioned to comfort care.  No further nutrition interventions planned at this time.  Please re-consult as needed.   Alecsander Hattabaugh RD, LDN For contact information, refer to AMiON.   

## 2023-02-13 DIAGNOSIS — N179 Acute kidney failure, unspecified: Secondary | ICD-10-CM | POA: Diagnosis not present

## 2023-02-13 DIAGNOSIS — R630 Anorexia: Secondary | ICD-10-CM

## 2023-02-13 DIAGNOSIS — Z7189 Other specified counseling: Secondary | ICD-10-CM | POA: Diagnosis not present

## 2023-02-13 DIAGNOSIS — Z515 Encounter for palliative care: Secondary | ICD-10-CM | POA: Diagnosis not present

## 2023-02-13 DIAGNOSIS — K767 Hepatorenal syndrome: Secondary | ICD-10-CM | POA: Diagnosis not present

## 2023-02-13 DIAGNOSIS — D689 Coagulation defect, unspecified: Secondary | ICD-10-CM

## 2023-02-13 DIAGNOSIS — Z66 Do not resuscitate: Secondary | ICD-10-CM | POA: Diagnosis not present

## 2023-02-13 NOTE — TOC Progression Note (Signed)
Transition of Care [facility]) - Progression Note    Patient Details  Name: [name] MRN: [id number] Date of Birth: [birth year]  [facility]) CM/SW Contact  Georgie Chard, LCSW Phone Number: 02/13/2023, 10:44 AM  Clinical Narrative:    CSW spoke to [facility] at this time there are no current beds. TOC will continue to follow.   Expected Discharge Plan: Hospice Medical Facility Barriers to Discharge: Continued Medical Work up  Expected Discharge Plan and Services In-house Referral: NA Discharge Planning Services: CM Consult Post Acute Care Choice: Hospice Living arrangements for the past 2 months: Single Family Home                 DME Arranged: N/A DME Agency: NA       HH Arranged: NA HH Agency: NA         Social Determinants of Health (SDOH) Interventions SDOH Screenings   Food Insecurity: No Food Insecurity (02/06/2023)  Housing: Low Risk  (02/06/2023)  Transportation Needs: No Transportation Needs (02/06/2023)  Utilities: Not At Risk (02/06/2023)  Alcohol Screen: Low Risk  (10/30/2019)  Depression (PHQ2-9): Medium Risk (10/30/2019)  Financial Resource Strain: Medium Risk (10/30/2019)  Physical Activity: Inactive (10/30/2019)  Social Connections: Moderately Integrated (10/30/2019)  Stress: No Stress Concern Present (10/30/2019)  Tobacco Use: Low Risk  (02/08/2023)    Readmission Risk Interventions    02/11/2023    3:37 PM 02/03/2023    2:33 PM 01/11/2023    8:51 AM  Readmission Risk Prevention Plan  Transportation Screening Complete Complete Complete  HRI or Home Care Consult  Complete Complete  Social Work Consult for Recovery Care Planning/Counseling  Complete Complete  Palliative Care Screening  Not Applicable Not Applicable  Medication Review Oceanographer) Complete Complete Complete  PCP or Specialist appointment within 3-5 days of discharge Complete    HRI or Home Care Consult Complete    SW Recovery Care/Counseling Consult Complete     Palliative Care Screening Complete    Skilled Nursing Facility Not Applicable

## 2023-02-13 NOTE — Plan of Care (Signed)
  Problem: Education: Goal: Knowledge of General Education information will improve Description: Including pain rating scale, medication(s)/side effects and non-pharmacologic comfort measures Outcome: Progressing   Problem: Nutrition: Goal: Adequate nutrition will be maintained Outcome: Progressing   Problem: Pain Management: Goal: General experience of comfort will improve Outcome: Progressing   Problem: Skin Integrity: Goal: Risk for impaired skin integrity will decrease Outcome: Progressing   Problem: Coping: Goal: Ability to adjust to condition or change in health will improve Outcome: Progressing   Problem: Skin Integrity: Goal: Risk for impaired skin integrity will decrease Outcome: Progressing   Problem: Nutritional: Goal: Ability to make healthy dietary choices will improve Outcome: Progressing

## 2023-02-13 NOTE — Progress Notes (Signed)
PROGRESS NOTE  [name] SEG:[id number] DOB: 02-11-1951   PCP: Benita Stabile, MD  Patient is from: Home  DOA: 02/02/2023 LOS: 11  Chief complaints Chief Complaint  Patient presents with   Abnormal Lab     Brief Narrative / Interim history: 72 year old woman who presented to [facility] 11/11 as a transfer from [facility] for multisystem organ failure. PMHx significant for HTN, HLD, T2DM, GERD, anemia of chronic disease, chronic thrombocytopenia, hypothyroidism, depression, CKD stage IV, NASH cirrhosis with portal HTN as evidenced nonbleeding EV and ascites requiring frequent paracenteses (c/b SBP 12/2022).   Patient was initially admitted to [facility] 02/02/23 after she was noted to have abnormal labs at GI clinic (Cr 7.26 and worsening metabolic acidosis). Recent admission for hepatic encephalopathy and AKI 3 weeks PTA. Patient has required multiple paracenteses, last 02/01/2023. She was found to have AKI on CKD with concern for hepatorenal syndrome, hypotension, UTI and was subsequently admitted for further workup. Completed antibiotic course for UTI. Unfortunately, despite appropriate supportive treatment for HRS with Albumin, IVF, Octreotide and Norepinephrine, her renal function continued to worsen along with ongoing anuria. It was felt that she should be transferred to [facility] or [facility] for initiation of CRRT vs HD. She has been seen by GI at [facility] who recommends Octreotide, Levophed, Albumin, Cipro and possible repeat paracentesis if she accumulates more fluid (she had a paracentesis on prior admission and was treated with CTX for SBP).   Transferred to [facility] 11/11 for ongoing management to include CRRT.  Patient was evaluated by GI and nephrology here.  She underwent CRRT while in ICU.  Eventually, goal of care discussion started and patient was transition to full comfort care on 11/14, and transferred to [facility] service on 11/16.  Palliative medicine following.  Waiting on residential  hospice.   Subjective: Seen and examined earlier this morning.  No major events overnight of this morning.  No complaints.  Eating her breakfast.  Objective: Vitals:   02/11/23 1635 02/12/23 0900 02/12/23 1200 02/13/23 0501  BP: 127/72   (!) 117/52  Pulse:  69  70  Resp: 14  14 14   Temp:    98.2 F (36.8 C)  TempSrc:    Oral  SpO2:    92%  Weight:      Height:        Examination:  GENERAL: No apparent distress.  Nontoxic. HEENT: MMM.  Vision and hearing grossly intact.  NECK: Supple.  No apparent JVD.  RESP:  No IWOB.  Fair aeration bilaterally. CVS:  RRR. Heart sounds normal.  ABD/GI/GU: BS+. Abd soft, NTND.  MSK/EXT:  Moves extremities. No apparent deformity. No edema.  SKIN: no apparent skin lesion or wound NEURO: Awake, alert and oriented appropriately.  No apparent focal neuro deficit. PSYCH: Calm. Normal affect.   Procedures:  CRRT while in ICU  Microbiology summarized: MRSA PCR screen negative Urine culture negative Peritoneal fluid culture negative  Assessment and plan: End-of-life care/full comfort care/DNR-comfort -Appreciate guidance by palliative care-continue palliative meds as needed -Waiting on residential hospice-no bed available yet.  AKI on CKD-4/hepatorenal syndrome/hyponatremia: Baseline Cr about 2.0.  Decompensated NASH cirrhosis with ascites Elevated liver enzymes/hyperbilirubinemia/coagulopathy  Acute metabolic encephalopathy: Seems of resolved.  Ammonia within normal.  History of spontaneous bacterial peritonitis/history of hepatic encephalopathy  Anemia of chronic disease/thrombocytopenia  Essential hypertension/hyperlipidemia  IDDM-2  Hypothyroidism  Morbid obesity: Elevated BMI with comorbidity as above Body mass index is 34.69 kg/m. Nutrition Problem: Increased nutrient needs Etiology: catabolic illness (hepato-renal  syndrome; starting CRRT) Signs/Symptoms: estimated needs Interventions: Refer to RD note for  recommendations, Boost Breeze, Magic cup, MVI  Pressure skin injury: POA Pressure Injury 02/08/23 Buttocks Right;Left;Posterior Deep Tissue Pressure Injury - Purple or maroon localized area of discolored intact skin or blood-filled blister due to damage of underlying soft tissue from pressure and/or shear. blanchable (Active)  02/08/23 2020  Location: Buttocks  Location Orientation: Right;Left;Posterior  Staging: Deep Tissue Pressure Injury - Purple or maroon localized area of discolored intact skin or blood-filled blister due to damage of underlying soft tissue from pressure and/or shear.  Wound Description (Comments): blanchable  Present on Admission: Yes  Dressing Type Foam - Lift dressing to assess site every shift 02/13/23 0800   DVT prophylaxis:  Patient is comfort care  Code Status: DNR-comfort Family Communication: None at bedside Level of care: Palliative Care Status is: Inpatient Remains inpatient appropriate because: Waiting on residential hospice.   Final disposition: Residential hospice Consultants:  Gastroenterology Nephrology Critical care Palliative medicine  35 minutes with more than 50% spent in reviewing records, counseling patient/family and coordinating care.   Sch Meds:  Scheduled Meds:  Chlorhexidine Gluconate Cloth  6 each Topical Daily   lactulose  10 g Oral BID   lidocaine  1 patch Transdermal Q24H   nystatin  1 Application Topical TID   Continuous Infusions: PRN Meds:.acetaminophen **OR** acetaminophen, diphenhydrAMINE, fentaNYL (SUBLIMAZE) injection, glycopyrrolate **OR** glycopyrrolate **OR** glycopyrrolate, haloperidol lactate, lidocaine, liver oil-zinc oxide, midazolam, nystatin, ondansetron **OR** ondansetron (ZOFRAN) IV, oxyCODONE, polyvinyl alcohol  Antimicrobials: Anti-infectives (From admission, onward)    Start     Dose/Rate Route Frequency Ordered Stop   02/12/23 0800  ciprofloxacin (CIPRO) tablet 250 mg  Status:  Discontinued         250 mg Oral Daily with breakfast 02/11/23 1424 02/11/23 1637   02/09/23 2000  ciprofloxacin (CIPRO) tablet 250 mg  Status:  Discontinued        250 mg Oral 2 times daily 02/09/23 1301 02/11/23 1424   02/08/23 0800  ciprofloxacin (CIPRO) tablet 250 mg  Status:  Discontinued        250 mg Oral Daily with breakfast 02/07/23 1318 02/09/23 1301   02/06/23 1130  ciprofloxacin (CIPRO) tablet 250 mg  Status:  Discontinued        250 mg Oral Every 48 hours 02/06/23 1016 02/07/23 1318   02/03/23 1700  cefTRIAXone (ROCEPHIN) 1 g in sodium chloride 0.9 % 100 mL IVPB        1 g 200 mL/hr over 30 Minutes Intravenous Every 24 hours 02/02/23 2003 02/04/23 1732   02/02/23 1645  cefTRIAXone (ROCEPHIN) 1 g in sodium chloride 0.9 % 100 mL IVPB        1 g 200 mL/hr over 30 Minutes Intravenous  Once 02/02/23 1640 02/02/23 1809        I have personally reviewed the following labs and images: CBC: Recent Labs  Lab 02/07/23 0335 02/08/23 0353 02/08/23 2126 02/10/23 0449 02/11/23 0416  WBC 5.4 6.4 6.5 5.4 4.8  HGB 8.3* 8.1* 7.7* 7.3* 7.6*  HCT 25.1* 24.9* 23.1* 21.7* 23.7*  MCV 99.6 100.4* 100.9* 100.0 102.6*  PLT 23* 23* 23* 19* 23*   BMP &GFR Recent Labs  Lab 02/08/23 2126 02/09/23 0705 02/09/23 1552 02/10/23 0449 02/10/23 1559 02/11/23 0416  NA 137 137 137 136 139 134*  K 4.2 4.3 4.0 3.9 3.2* 4.8  CL 104 104 104 104 112* 102  CO2 17* 17* 20* 21* 18* 22  GLUCOSE 210* 187* 152* 157* 105* 186*  BUN 102* 101* 83* 53* 30* 23  CREATININE 8.72* 8.84* 7.10* 4.50* 2.63* 2.17*  CALCIUM 7.6* 7.6* 7.8* 7.8* 6.9* 8.6*  MG  --   --   --  2.0  --  2.3  PHOS 8.4*  --  6.3* 3.8 2.2* 2.2*   Estimated Creatinine Clearance: 28 mL/min (A) (by C-G formula based on SCr of 2.17 mg/dL (H)). Liver & Pancreas: Recent Labs  Lab 02/07/23 1047 02/08/23 0353 02/08/23 2126 02/09/23 0705 02/09/23 1552 02/10/23 0449 02/10/23 1559 02/11/23 0416  AST 12* 13*  --  13*  --  14*  --  15  ALT 10 10  --  9  --   9  --  9  ALKPHOS 41 41  --  41  --  42  --  43  BILITOT 2.0* 1.8*  --  2.0*  --  2.7*  --  4.0*  PROT 6.0* 5.5*  --  5.3*  --  5.4*  --  6.1*  ALBUMIN 4.9 3.8   < > 3.9 4.3 4.2  4.1 4.1 4.6  4.8   < > = values in this interval not displayed.   No results for input(s): "LIPASE", "AMYLASE" in the last 168 hours. No results for input(s): "AMMONIA" in the last 168 hours. Diabetic: No results for input(s): "HGBA1C" in the last 72 hours. Recent Labs  Lab 02/10/23 0731 02/10/23 1122 02/10/23 1643 02/10/23 2110 02/11/23 0734  GLUCAP 118* 148* 122* 138* 122*   Cardiac Enzymes: No results for input(s): "CKTOTAL", "CKMB", "CKMBINDEX", "TROPONINI" in the last 168 hours. No results for input(s): "PROBNP" in the last 8760 hours. Coagulation Profile: Recent Labs  Lab 02/08/23 0353 02/09/23 0705 02/10/23 0449 02/10/23 1615 02/11/23 0416  INR 2.1* 2.2* 2.3* 4.4* 2.2*   Thyroid Function Tests: No results for input(s): "TSH", "T4TOTAL", "FREET4", "T3FREE", "THYROIDAB" in the last 72 hours. Lipid Profile: No results for input(s): "CHOL", "HDL", "LDLCALC", "TRIG", "CHOLHDL", "LDLDIRECT" in the last 72 hours. Anemia Panel: No results for input(s): "VITAMINB12", "FOLATE", "FERRITIN", "TIBC", "IRON", "RETICCTPCT" in the last 72 hours. Urine analysis:    Component Value Date/Time   COLORURINE YELLOW 02/02/2023 1400   APPEARANCEUR CLOUDY (A) 02/02/2023 1400   APPEARANCEUR Clear 09/15/2022 1144   LABSPEC 1.014 02/02/2023 1400   PHURINE 5.0 02/02/2023 1400   GLUCOSEU NEGATIVE 02/02/2023 1400   HGBUR NEGATIVE 02/02/2023 1400   BILIRUBINUR NEGATIVE 02/02/2023 1400   BILIRUBINUR Negative 09/15/2022 1144   KETONESUR NEGATIVE 02/02/2023 1400   PROTEINUR NEGATIVE 02/02/2023 1400   UROBILINOGEN 0.2 07/19/2014 0510   NITRITE NEGATIVE 02/02/2023 1400   LEUKOCYTESUR MODERATE (A) 02/02/2023 1400   Sepsis Labs: Invalid input(s): "PROCALCITONIN", "LACTICIDVEN"  Microbiology: Recent Results  (from the past 240 hour(s))  Culture, body fluid w Gram Stain-bottle     Status: None   Collection Time: 02/05/23 11:55 AM   Specimen: Ascitic  Result Value Ref Range Status   Specimen Description ASCITIC  Final   Special Requests NONE  Final   Culture   Final    NO GROWTH 5 DAYS Performed at [facility], [address], [place], [place] [address]    Report Status 02/10/2023 FINAL  Final  Gram stain     Status: None   Collection Time: 02/05/23 11:55 AM   Specimen: Ascitic  Result Value Ref Range Status   Specimen Description ASCITIC  Final   Special Requests NONE  Final   Gram Stain  Final    NO ORGANISMS SEEN WBC PRESENT,BOTH PMN AND MONONUCLEAR CYTOSPIN SMEAR Performed at [facility], [address], [place], [place] [address]    Report Status 02/05/2023 FINAL  Final    Radiology Studies: No results found.    Elya Tarquinio T. Jacobie Stamey [facility]  If 7PM-7AM, please contact night-coverage [email or web address] 02/13/2023, 11:29 AM

## 2023-02-13 NOTE — Progress Notes (Signed)
Daily Progress Note   Patient Name: [name]       Date: 02/13/2023 DOB: 1951-02-17  Age: 72 y.o. MRN#: [id number] Attending Physician: Almon Hercules, MD Primary Care Physician: Benita Stabile, MD Admit Date: 02/02/2023  Reason for Consultation/Follow-up: Terminal Care  Subjective: Now on 6th floor, continues to appear weak, resting in bed.   Length of Stay: 11  Current Medications: Scheduled Meds:   Chlorhexidine Gluconate Cloth  6 each Topical Daily   lactulose  10 g Oral BID   lidocaine  1 patch Transdermal Q24H   nystatin  1 Application Topical TID    Continuous Infusions:   PRN Meds: acetaminophen **OR** acetaminophen, diphenhydrAMINE, fentaNYL (SUBLIMAZE) injection, glycopyrrolate **OR** glycopyrrolate **OR** glycopyrrolate, haloperidol lactate, lidocaine, liver oil-zinc oxide, midazolam, nystatin, ondansetron **OR** ondansetron (ZOFRAN) IV, oxyCODONE, polyvinyl alcohol  Physical Exam         Chronically ill appearing Diminished breath sounds Some peripheral edema Awake, reasonably alert.   Vital Signs: BP (!) 117/52 (BP Location: Left Arm)   Pulse 70   Temp 98.2 F (36.8 C) (Oral)   Resp 14   Ht 5\' 6"  (1.676 m)   Wt 97.5 kg   SpO2 92%   BMI 34.69 kg/m  SpO2: SpO2: 92 % O2 Device: O2 Device: Room Air O2 Flow Rate: O2 Flow Rate (L/min): 2 L/min  Intake/output summary:  Intake/Output Summary (Last 24 hours) at 02/13/2023 1054 Last data filed at 02/12/2023 1200 Gross per 24 hour  Intake 240 ml  Output --  Net 240 ml   LBM: Last BM Date : 02/11/23 Baseline Weight: Weight: 96.9 kg Most recent weight: Weight: 97.5 kg       Palliative Assessment/Data:      Patient Active Problem List   Diagnosis Date Noted   Encounter for palliative care  02/12/2023   DNR (do not resuscitate) 02/11/2023   Coagulopathy (HCC) 02/10/2023   Goals of care, counseling/discussion 02/10/2023   DNR (do not resuscitate) discussion 02/10/2023   Hepatorenal syndrome (HCC) 02/07/2023   Poor appetite 02/07/2023   AKI (acute kidney injury) (HCC) 02/02/2023   Anemia due to chronic kidney disease 02/02/2023   UTI (urinary tract infection) 02/02/2023   Hypotension 02/02/2023   Esophageal varices (HCC) 01/28/2023   Hepatic encephalopathy syndrome (HCC) 01/17/2023   Pressure injury  of skin 01/15/2023   SBP (spontaneous bacterial peritonitis) (HCC) 01/14/2023   Encephalopathy, hepatic (HCC) 01/14/2023   Acute hepatic encephalopathy (HCC) 01/10/2023   Ascites 01/10/2023   Anemia in stage 4 chronic kidney disease (HCC) 10/15/2022   Complete prolapse of vaginal vault 09/15/2022   Urinary retention 09/15/2022   Vaginal pessary present 09/15/2022   Elevated brain natriuretic peptide (BNP) level 09/03/2022   Hypoalbuminemia due to protein-calorie malnutrition (HCC) 09/03/2022   Prolonged QT interval 08/25/2022   Vaginal prolapse 08/25/2022   Acute kidney injury superimposed on stage 4 chronic kidney disease (HCC) 08/25/2022   Obesity 06/16/2022   Cirrhosis, non-alcoholic (HCC) 12/04/2020   Vitamin D deficiency 11/26/2020   Mixed hyperlipidemia 11/26/2020   Gout 11/26/2020   History of thrombocytopenia 11/26/2020   Severe Thrombocytopenia 07/30/2017   CKD stage 3 due to type 2 diabetes mellitus (HCC) 06/30/2017   Liver cirrhosis secondary to NASH (HCC) 06/30/2017   Hypertension with renal disease 03/19/2017   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/19/2017   Tremor 08/06/2016   Vitreous hemorrhage of left eye (HCC) 01/29/2015   Diabetic macular edema (HCC) 12/25/2014   Hypertensive retinopathy of both eyes 12/25/2014   Proliferative diabetic retinopathy (HCC) 08/04/2011   Bilateral nondiabetic proliferative retinopathy 04/03/2011   Type 2  diabetes mellitus with diabetic chronic kidney disease (HCC) 02/05/2010   Acquired hypothyroidism 03/20/2008   RENAL DISEASE, CHRONIC, STAGE II 03/20/2008   Hereditary and idiopathic peripheral neuropathy 02/21/2008   Pseudophakia of both eyes 02/21/2008   GERD 02/21/2008    Palliative Care Assessment & Plan   Patient Profile:    Assessment:  72 year old woman who presented to [facility] 11/11 as a transfer from [facility] for multisystem organ failure. PMHx significant for HTN, HLD, T2DM, GERD, anemia of chronic disease, chronic thrombocytopenia, hypothyroidism, depression, CKD stage IV, NASH cirrhosis with portal HTN   Recommendations/Plan:  Continue current mode of care, transfer to hospice house when bed available.     Code Status:    Code Status Orders  (From admission, onward)           Start     Ordered   02/11/23 1306  Do not attempt resuscitation (DNR) - Comfort care  Continuous       Question Answer Comment  If patient has no pulse and is not breathing Do Not Attempt Resuscitation   In Pre-Arrest Conditions (Patient Is Breathing and Has a Pulse) Provide comfort measures. Relieve any mechanical airway obstruction. Avoid transfer unless required for comfort.   Consent: Discussion documented in EHR or advanced directives reviewed      02/11/23 1306           Code Status History     Date Active Date Inactive Code Status Order ID Comments User Context   02/11/2023 1045 02/11/2023 1306 Do not attempt resuscitation (DNR) - Comfort care [id number]  Lanier Clam, NP Inpatient   02/02/2023 1731 02/11/2023 1045 Full Code [id number]  Lilyan Gilford, DO ED   01/10/2023 1923 01/19/2023 2107 Do not attempt resuscitation (DNR) PRE-ARREST INTERVENTIONS DESIRED [id number]  Onnie Boer, MD Inpatient   09/10/2022 1415 09/11/2022 0054 DNR [id number]  Katheran Awe, NP Inpatient   09/03/2022 2119 09/10/2022 1415 Full Code [id number]  Frankey Shown, DO Inpatient   08/25/2022 1628  08/28/2022 1629 Full Code [id number]  Onnie Boer, MD ED   04/05/2013 2233 04/10/2013 2020 Full Code [id number]  Romie Levee, MD Inpatient       Prognosis:  <  2 weeks  Discharge Planning: Hospice facility  Care plan was discussed with patient.   Thank you for allowing the Palliative Medicine Team to assist in the care of this patient. Low MDM     Greater than 50%  of this time was spent counseling and coordinating care related to the above assessment and plan.  Rosalin Hawking, MD  Please contact Palliative Medicine Team phone at [phone] for questions and concerns.

## 2023-02-14 DIAGNOSIS — N390 Urinary tract infection, site not specified: Secondary | ICD-10-CM | POA: Diagnosis not present

## 2023-02-14 DIAGNOSIS — N179 Acute kidney failure, unspecified: Secondary | ICD-10-CM | POA: Diagnosis not present

## 2023-02-14 DIAGNOSIS — K767 Hepatorenal syndrome: Secondary | ICD-10-CM | POA: Diagnosis not present

## 2023-02-14 DIAGNOSIS — E1122 Type 2 diabetes mellitus with diabetic chronic kidney disease: Secondary | ICD-10-CM | POA: Diagnosis not present

## 2023-02-14 MED ORDER — ONDANSETRON HCL 4 MG PO TABS: 4.0000 mg | ORAL_TABLET | Freq: Every day | ORAL | 1 refills | Status: AC | PRN

## 2023-02-14 MED ORDER — MORPHINE SULFATE (CONCENTRATE) 10 MG /0.5 ML PO SOLN: 10.0000 mg | ORAL | 0 refills | Status: AC | PRN

## 2023-02-14 MED ORDER — GLYCOPYRROLATE 1 MG/5ML PO SOLN: 1.0000 mg | Freq: Four times a day (QID) | ORAL | 0 refills | Status: AC | PRN

## 2023-02-14 MED ORDER — ACETAMINOPHEN 325 MG PO TABS: 650.0000 mg | ORAL_TABLET | Freq: Four times a day (QID) | ORAL | Status: AC | PRN

## 2023-02-14 MED ORDER — HYDROMORPHONE HCL 1 MG/ML IJ SOLN
0.5000 mg | Freq: Once | INTRAMUSCULAR | Status: AC
  Administered 2023-02-14: 0.5 mg via INTRAVENOUS
  Filled 2023-02-14 (×2): qty 0.5

## 2023-02-14 MED ORDER — LORAZEPAM 0.5 MG PO TABS: 0.5000 mg | ORAL_TABLET | ORAL | 0 refills | Status: AC | PRN

## 2023-02-14 MED ORDER — OXYCODONE HCL 5 MG PO TABS: 5.0000 mg | ORAL_TABLET | ORAL | Status: DC | PRN

## 2023-02-14 NOTE — Discharge Summary (Signed)
Physician Discharge Summary  [name] ZOX:[id number] DOB: [birth year] DOA: 02/02/2023  PCP: Benita Stabile, MD  Admit date: 02/02/2023 Discharge date: 02/14/2023 Admitted From: Home Disposition: Residential hospice   Discharge Condition: Stable for transfer CODE STATUS: DNR/DNI   Hospital course 72 year old woman who presented to [facility] 11/11 as a transfer from [facility] for multisystem organ failure. PMHx significant for HTN, HLD, T2DM, GERD, anemia of chronic disease, chronic thrombocytopenia, hypothyroidism, depression, CKD stage IV, NASH cirrhosis with portal HTN as evidenced nonbleeding EV and ascites requiring frequent paracenteses (c/b SBP 12/2022).   Patient was initially admitted to [facility] 02/02/23 after she was noted to have abnormal labs at GI clinic (Cr 7.26 and worsening metabolic acidosis). Recent admission for hepatic encephalopathy and AKI 3 weeks PTA. Patient has required multiple paracenteses, last 02/01/2023. She was found to have AKI on CKD with concern for hepatorenal syndrome, hypotension, UTI and was subsequently admitted for further workup. Completed antibiotic course for UTI. Unfortunately, despite appropriate supportive treatment for HRS with Albumin, IVF, Octreotide and Norepinephrine, her renal function continued to worsen along with ongoing anuria. It was felt that she should be transferred to [facility] or [facility] for initiation of CRRT vs HD. She has been seen by GI at [facility] who recommends Octreotide, Levophed, Albumin, Cipro and possible repeat paracentesis if she accumulates more fluid (she had a paracentesis on prior admission and was treated with CTX for SBP).   Transferred to [facility] 11/11 for ongoing management to include CRRT.  Patient was evaluated by GI and nephrology here.  She underwent CRRT while in ICU.  Eventually, goal of care discussion started and patient was transition to full comfort care on 11/14, and transferred to [facility] on 02/13/2023, and discharged to  residential hospice on 02/14/2023.  See individual problem list below for more.   Problems addressed during this hospitalization End-of-life care/full comfort care/DNR-comfort -Appreciate guidance by palliative care -Transfer to residential hospice for end-of-life care.   AKI on CKD-4/hepatorenal syndrome/hyponatremia: Baseline Cr about 2.0.   Decompensated NASH cirrhosis with ascites Elevated liver enzymes/hyperbilirubinemia/coagulopathy   Acute metabolic encephalopathy: Seems of resolved.  Ammonia within normal.   History of spontaneous bacterial peritonitis/history of hepatic encephalopathy   Anemia of chronic disease/thrombocytopenia   Essential hypertension/hyperlipidemia   IDDM-2   Hypothyroidism   Morbid obesity: Elevated BMI with comorbidity as above Body mass index is 34.69 kg/m. Nutrition Problem: Increased nutrient needs Etiology: catabolic illness (hepato-renal syndrome; starting CRRT) Signs/Symptoms: estimated needs Interventions: Refer to RD note for recommendations, Boost Breeze, Magic cup, MVI   Pressure skin injury: POA Pressure Injury 02/08/23 Buttocks Right;Left;Posterior Deep Tissue Pressure Injury - Purple or maroon localized area of discolored intact skin or blood-filled blister due to damage of underlying soft tissue from pressure and/or shear. blanchable (Active)  02/08/23 2020  Location: Buttocks  Location Orientation: Right;Left;Posterior  Staging: Deep Tissue Pressure Injury - Purple or maroon localized area of discolored intact skin or blood-filled blister due to damage of underlying soft tissue from pressure and/or shear.  Wound Description (Comments): blanchable  Present on Admission: Yes  Dressing Type Foam - Lift dressing to assess site every shift 02/14/23 0839    Time spent 35 minutes  Vital signs Vitals:   02/11/23 1635 02/12/23 0900 02/12/23 1200 02/13/23 0501  BP: 127/72   (!) 117/52  Pulse:  69  70  Temp:    98.2 F (36.8 C)   Resp: 14  14 14   Height:  Weight:      SpO2:    92%  TempSrc:    Oral  BMI (Calculated):         Discharge exam GENERAL: Resting and sleeping comfortably.  No apparent distress. RESP:  No IWOB.  On room air. CVS:  RRR. Heart sounds normal.  MSK/EXT:  Moves extremities. No apparent deformity. No edema.  NEURO: Sleeping. PSYCH: Calm.  No distress or agitation.  Discharge Instructions Discharge Instructions     No wound care   Complete by: As directed       Allergies as of 02/14/2023       Reactions   Bactrim [sulfamethoxazole-trimethoprim] Nausea And Vomiting, Other (See Comments)   Dizziness   Cipro [ciprofloxacin Hcl] Other (See Comments)   Severe yeast infection   Codeine Other (See Comments)   Unknown reaction   Cozaar [losartan Potassium] Cough   Lipitor [atorvastatin] Other (See Comments)   Arthralgias Myalgias   Mevacor [lovastatin] Other (See Comments)   Arthralgias  Myalgias   Pravachol [pravastatin] Other (See Comments)   Arthralgias  Myalgias    Rosuvastatin Diarrhea, Other (See Comments)   Arthralgias Myalgias   Statins Other (See Comments)   Arthralgias Myalgias   Welchol [colesevelam] Other (See Comments)   Arthralgias Myalgias    Zestril [lisinopril] Cough   Zetia [ezetimibe] Other (See Comments)   Arthralgias Myalgias    Dilaudid [hydromorphone] Nausea And Vomiting, Other (See Comments)   GI Intolerance        Medication List     STOP taking these medications    ciprofloxacin 500 MG tablet Commonly known as: CIPRO   lactose free nutrition Liqd   lactulose 10 GM/15ML solution Commonly known as: CHRONULAC   levothyroxine 125 MCG tablet Commonly known as: SYNTHROID   NovoLIN N ReliOn 100 UNIT/ML injection Generic drug: insulin NPH Human   NovoLIN R ReliOn 100 units/mL injection Generic drug: insulin regular   nystatin powder Commonly known as: MYCOSTATIN/NYSTOP   propranolol 20 MG tablet Commonly known as:  INDERAL   sodium bicarbonate 650 MG tablet   torsemide 20 MG tablet Commonly known as: DEMADEX   Vitamin D (Ergocalciferol) 1.25 MG (50000 UNIT) Caps capsule Commonly known as: DRISDOL       TAKE these medications    acetaminophen 325 MG tablet Commonly known as: TYLENOL Take 2 tablets (650 mg total) by mouth every 6 (six) hours as needed (Fever >/= 101). What changed:  medication strength how much to take reasons to take this   Glycopyrrolate 1 MG/5ML Soln Take 5 mLs (1 mg total) by mouth 4 (four) times daily as needed for up to 3 days.   LORazepam 0.5 MG tablet Commonly known as: Ativan Take 1 tablet (0.5 mg total) by mouth every 4 (four) hours as needed for up to 20 doses for anxiety.   morphine CONCENTRATE 10 mg / 0.5 ml concentrated solution Take 0.5 mLs (10 mg total) by mouth every 3 (three) hours as needed for moderate pain (pain score 4-6), severe pain (pain score 7-10) or shortness of breath.   ondansetron 4 MG tablet Commonly known as: Zofran Take 1 tablet (4 mg total) by mouth daily as needed for nausea or vomiting.        Consultations: Gastroenterology Nephrology Critical care Palliative medicine  Procedures/Studies: CRRT   DG CHEST PORT 1 VIEW  Result Date: 02/08/2023 CLINICAL DATA:  Central venous catheter EXAM: PORTABLE CHEST 1 VIEW COMPARISON:  Chest x-ray 02/03/2023 FINDINGS: New left-sided central venous  catheter tip projects over the brachiocephalic vein. Right-sided central venous catheter tip projects over the mid SVC, unchanged. The heart is mildly enlarged. There central pulmonary vascular congestion which has increased. There is no lung infiltrate, pleural effusion or pneumothorax. The osseous structures are stable. IMPRESSION: 1. New left-sided central venous catheter tip projects over the brachiocephalic vein. No pneumothorax. 2. Mild cardiomegaly with central pulmonary vascular congestion which has increased. Electronically Signed    By: Darliss Cheney M.D.   On: 02/08/2023 23:41   US Paracentesis  Result Date: 02/05/2023 INDICATION: Patient with history of NASH cirrhosis, recurrent ascites currently admitted with AKI and concern for SBP. Request for diagnostic only paracentesis. EXAM: ULTRASOUND GUIDED DIAGNOSTIC PARACENTESIS MEDICATIONS: 8 mL 1% lidocaine COMPLICATIONS: None immediate. PROCEDURE: Informed written consent was obtained from the patient after a discussion of the risks, benefits and alternatives to treatment. A timeout was performed prior to the initiation of the procedure. Initial ultrasound scanning demonstrates a large amount of ascites within the left lower abdominal quadrant. The left lower abdomen was prepped and draped in the usual sterile fashion. 1% lidocaine was used for local anesthesia. Following this, a 19 gauge, 7-cm, Yueh catheter was introduced. An ultrasound image was saved for documentation purposes. The paracentesis was performed. The catheter was removed and a dressing was applied. The patient tolerated the procedure well without immediate post procedural complication. FINDINGS: A total of approximately 100 mL of clear yellow fluid was removed for diagnostic purposes only. Samples were sent to the laboratory as requested by the clinical team. IMPRESSION: Successful ultrasound-guided paracentesis yielding 100 milliliters of peritoneal fluid. Performed by Lynnette Caffey, PA-C Electronically Signed   By: Acquanetta Belling M.D.   On: 02/05/2023 12:12   DG CHEST PORT 1 VIEW  Result Date: 02/03/2023 CLINICAL DATA:  Central line placement. EXAM: PORTABLE CHEST 1 VIEW COMPARISON:  Chest radiograph dated 09/11/2022. FINDINGS: Right-sided central venous catheter with tip over central SVC. There is mild cardiomegaly and mild vascular congestion. No focal consolidation, pleural effusion or pneumothorax. No acute osseous pathology. Cervical fusion hardware. IMPRESSION: Right-sided central venous catheter with tip over  central SVC. No pneumothorax. Electronically Signed   By: Elgie Collard M.D.   On: 02/03/2023 20:35   [place] EKG SITE RITE  Result Date: 02/03/2023 If Site Rite image not attached, placement could not be confirmed due to current cardiac rhythm.  US RENAL  Result Date: 02/03/2023 CLINICAL DATA:  Acute kidney injury EXAM: RENAL / URINARY TRACT ULTRASOUND COMPLETE COMPARISON:  None Available. FINDINGS: Right Kidney: Renal measurements: 9.1 x 4.5 x 4.5 cm = volume: 96.2 mL. Echogenicity within normal limits. No mass or hydronephrosis visualized. Left Kidney: Renal measurements: 8.6 x 4.7 x 4.2 cm = volume: 89.4 mL. Echogenicity within normal limits. No mass or hydronephrosis visualized. Bladder: Appears normal for degree of bladder distention. Other: Cirrhotic liver morphology abdominal ascites. IMPRESSION: No hydronephrosis. Electronically Signed   By: Allegra Lai M.D.   On: 02/03/2023 12:06   CT ABDOMEN PELVIS WO CONTRAST  Result Date: 02/02/2023 CLINICAL DATA:  Dizziness. Abnormal renal function. Abdominal/flank pain. EXAM: CT ABDOMEN AND PELVIS WITHOUT CONTRAST TECHNIQUE: Multidetector CT imaging of the abdomen and pelvis was performed following the standard protocol without IV contrast. RADIATION DOSE REDUCTION: This exam was performed according to the departmental dose-optimization program which includes automated exposure control, adjustment of the mA and/or kV according to patient size and/or use of iterative reconstruction technique. COMPARISON:  09/04/2022 FINDINGS: Lower chest: Left lower lobe subpleural scarring  or atelectasis. Mild cardiomegaly with right coronary artery calcification. Hepatobiliary: Advanced cirrhosis. Cholecystectomy, without biliary ductal dilatation. Pancreas: Fatty replaced pancreas. Spleen: Splenomegaly is mild at 14 cm. Adrenals/Urinary Tract: Normal right adrenal gland. Left adrenal not well visualized secondary to left upper quadrant varices. Bilateral renal  cortical thinning. Left renal vascular calcifications. No renal calculi or hydronephrosis. No hydroureter or ureteric calculi. Decompressed urinary bladder, without bladder calculi. A calcification along the left bladder wall is favored to be pericystic and vascular. Stomach/Bowel: The stomach is primarily underdistended. Normal colon and terminal ileum. Normal small bowel. Vascular/Lymphatic: Aortic atherosclerosis. Portal venous hypertension, with left upper quadrant portosystemic collaterals. No abdominopelvic adenopathy. Reproductive: Hysterectomy.  No adnexal mass. Other: Pessary in place. Residual pelvic floor laxity. Moderate volume abdominopelvic ascites is similar. No free intraperitoneal air. Anasarca Musculoskeletal: Osteopenia. Mild superior endplate compression deformity at L4 is unchanged. IMPRESSION: 1. No urinary tract calculi or obstructive uropathy. Otherwise low sensitivity exam secondary to stone study technique. 2. Cirrhosis and portal venous hypertension with moderate volume abdominopelvic ascites. 3. Aortic Atherosclerosis (ICD10-I70.0). Coronary artery atherosclerosis. Electronically Signed   By: Jeronimo Greaves M.D.   On: 02/02/2023 16:19   US Paracentesis  Result Date: 02/01/2023 INDICATION: Recurrent symptomatic ascites. EXAM: ULTRASOUND-GUIDED PARACENTESIS COMPARISON:  Multiple previous ultrasound-guided paracenteses, most recently on 01/17/2023 yielding 4 L of ascitic fluid MEDICATIONS: None. COMPLICATIONS: None immediate. TECHNIQUE: Informed written consent was obtained from the patient after a discussion of the risks, benefits and alternatives to treatment. A timeout was performed prior to the initiation of the procedure. Initial ultrasound scanning demonstrates a moderate-to-large amount of ascites within the left lower abdomen which was subsequently prepped and draped in the usual sterile fashion. 1% lidocaine with epinephrine was used for local anesthesia. An ultrasound image was  saved for documentation purposed. An 8 Fr Safe-T-Centesis catheter was introduced. The paracentesis was performed. The catheter was removed and a dressing was applied. The patient tolerated the procedure well without immediate post procedural complication. FINDINGS: A total of approximately 4 liters of serous fluid was removed. IMPRESSION: Successful ultrasound-guided paracentesis yielding 4 liters of peritoneal fluid. Electronically Signed   By: Simonne Come M.D.   On: 02/01/2023 15:22   US Paracentesis  Addendum Date: 01/25/2023   ADDENDUM REPORT: 01/25/2023 14:19 ADDENDUM: No samples were sent to the laboratory. The paracentesis was performed in the left lower quadrant. Electronically Signed   By: Marin Roberts M.D.   On: 01/25/2023 14:19   Result Date: 01/25/2023 INDICATION: Ascites.  Liver disease. EXAM: ULTRASOUND GUIDED right lower quadrant PARACENTESIS MEDICATIONS: None COMPLICATIONS: None immediate. PROCEDURE: Informed written consent was obtained from the patient after a discussion of the risks, benefits and alternatives to treatment. A timeout was performed prior to the initiation of the procedure. Initial ultrasound scanning demonstrates a large amount of ascites within the right lower abdominal quadrant. The right lower abdomen was prepped and draped in the usual sterile fashion. 1% lidocaine was used for local anesthesia. Following this, a 19 gauge, 7-cm, Yueh catheter was introduced. An ultrasound image was saved for documentation purposes. The paracentesis was performed. The catheter was removed and a dressing was applied. The patient tolerated the procedure well without immediate post procedural complication. Patient received post-procedure intravenous albumin; see nursing notes for details. FINDINGS: A total of approximately 4 L of yellow fluid was removed. Samples were sent to the laboratory as requested by the clinical team. IMPRESSION: Successful ultrasound-guided paracentesis  yielding 4 liters of peritoneal fluid. Electronically Signed: By: Cristal Deer  Mattern M.D. On: 01/25/2023 14:12   US Paracentesis  Result Date: 01/18/2023 INDICATION: History of cirrhosis, now with recurrent symptomatic ascites. Please perform ultrasound-guided paracentesis for diagnostic and therapeutic purposes. Maximum volume is 5 L. EXAM: ULTRASOUND-GUIDED PARACENTESIS COMPARISON:  Multiple previous ultrasound-guided paracenteses, most recently on 01/11/2023 yielding 5.5 L of peritoneal fluid. MEDICATIONS: None. COMPLICATIONS: None immediate. TECHNIQUE: Informed written consent was obtained from the patient after a discussion of the risks, benefits and alternatives to treatment. A timeout was performed prior to the initiation of the procedure. Initial ultrasound scanning demonstrates a moderate-to-large amount of ascites within the left lower abdomen which was subsequently prepped and draped in the usual sterile fashion. 1% lidocaine with epinephrine was used for local anesthesia. An ultrasound image was saved for documentation purposed. An 8 Fr Safe-T-Centesis catheter was introduced. The paracentesis was performed. The catheter was removed and a dressing was applied. The patient tolerated the procedure well without immediate post procedural complication. FINDINGS: A total of approximately 5 liters of serous fluid was removed. Samples were sent to the laboratory as requested by the clinical team. IMPRESSION: Successful ultrasound-guided paracentesis yielding 5 liters of peritoneal fluid. Electronically Signed   By: Simonne Come M.D.   On: 01/18/2023 17:07       The results of significant diagnostics from this hospitalization (including imaging, microbiology, ancillary and laboratory) are listed below for reference.     Microbiology: Recent Results (from the past 240 hour(s))  Culture, body fluid w Gram Stain-bottle     Status: None   Collection Time: 02/05/23 11:55 AM   Specimen: Ascitic   Result Value Ref Range Status   Specimen Description ASCITIC  Final   Special Requests NONE  Final   Culture   Final    NO GROWTH 5 DAYS Performed at [facility], [address], [place], [place] [address]    Report Status 02/10/2023 FINAL  Final  Gram stain     Status: None   Collection Time: 02/05/23 11:55 AM   Specimen: Ascitic  Result Value Ref Range Status   Specimen Description ASCITIC  Final   Special Requests NONE  Final   Gram Stain   Final    NO ORGANISMS SEEN WBC PRESENT,BOTH PMN AND MONONUCLEAR CYTOSPIN SMEAR Performed at [facility], [address] [place], [place] [address]    Report Status 02/05/2023 FINAL  Final     Labs:  CBC: Recent Labs  Lab 02/08/23 0353 02/08/23 2126 02/10/23 0449 02/11/23 0416  WBC 6.4 6.5 5.4 4.8  HGB 8.1* 7.7* 7.3* 7.6*  HCT 24.9* 23.1* 21.7* 23.7*  MCV 100.4* 100.9* 100.0 102.6*  PLT 23* 23* 19* 23*   BMP &GFR Recent Labs  Lab 02/08/23 2126 02/09/23 0705 02/09/23 1552 02/10/23 0449 02/10/23 1559 02/11/23 0416  NA 137 137 137 136 139 134*  K 4.2 4.3 4.0 3.9 3.2* 4.8  CL 104 104 104 104 112* 102  CO2 17* 17* 20* 21* 18* 22  GLUCOSE 210* 187* 152* 157* 105* 186*  BUN 102* 101* 83* 53* 30* 23  CREATININE 8.72* 8.84* 7.10* 4.50* 2.63* 2.17*  CALCIUM 7.6* 7.6* 7.8* 7.8* 6.9* 8.6*  MG  --   --   --  2.0  --  2.3  PHOS 8.4*  --  6.3* 3.8 2.2* 2.2*   Estimated Creatinine Clearance: 28 mL/min (A) (by C-G formula based on SCr of 2.17 mg/dL (H)). Liver & Pancreas: Recent Labs  Lab 02/08/23 0353 02/08/23 2126 02/09/23 0705 02/09/23 1552 02/10/23  8119 02/10/23 1559 02/11/23 0416  AST 13*  --  13*  --  14*  --  15  ALT 10  --  9  --  9  --  9  ALKPHOS 41  --  41  --  42  --  43  BILITOT 1.8*  --  2.0*  --  2.7*  --  4.0*  PROT 5.5*  --  5.3*  --  5.4*  --  6.1*  ALBUMIN 3.8   < > 3.9 4.3 4.2  4.1 4.1 4.6  4.8   < > = values in this interval not displayed.   No results for input(s): "LIPASE", "AMYLASE" in the  last 168 hours. No results for input(s): "AMMONIA" in the last 168 hours. Diabetic: No results for input(s): "HGBA1C" in the last 72 hours. Recent Labs  Lab 02/10/23 0731 02/10/23 1122 02/10/23 1643 02/10/23 2110 02/11/23 0734  GLUCAP 118* 148* 122* 138* 122*   Cardiac Enzymes: No results for input(s): "CKTOTAL", "CKMB", "CKMBINDEX", "TROPONINI" in the last 168 hours. No results for input(s): "PROBNP" in the last 8760 hours. Coagulation Profile: Recent Labs  Lab 02/08/23 0353 02/09/23 0705 02/10/23 0449 02/10/23 1615 02/11/23 0416  INR 2.1* 2.2* 2.3* 4.4* 2.2*   Thyroid Function Tests: No results for input(s): "TSH", "T4TOTAL", "FREET4", "T3FREE", "THYROIDAB" in the last 72 hours. Lipid Profile: No results for input(s): "CHOL", "HDL", "LDLCALC", "TRIG", "CHOLHDL", "LDLDIRECT" in the last 72 hours. Anemia Panel: No results for input(s): "VITAMINB12", "FOLATE", "FERRITIN", "TIBC", "IRON", "RETICCTPCT" in the last 72 hours. Urine analysis:    Component Value Date/Time   COLORURINE YELLOW 02/02/2023 1400   APPEARANCEUR CLOUDY (A) 02/02/2023 1400   APPEARANCEUR Clear 09/15/2022 1144   LABSPEC 1.014 02/02/2023 1400   PHURINE 5.0 02/02/2023 1400   GLUCOSEU NEGATIVE 02/02/2023 1400   HGBUR NEGATIVE 02/02/2023 1400   BILIRUBINUR NEGATIVE 02/02/2023 1400   BILIRUBINUR Negative 09/15/2022 1144   KETONESUR NEGATIVE 02/02/2023 1400   PROTEINUR NEGATIVE 02/02/2023 1400   UROBILINOGEN 0.2 07/19/2014 0510   NITRITE NEGATIVE 02/02/2023 1400   LEUKOCYTESUR MODERATE (A) 02/02/2023 1400   Sepsis Labs: Invalid input(s): "PROCALCITONIN", "LACTICIDVEN"   SIGNED:  Almon Hercules, MD  [facility] 02/14/2023, 12:01 PM

## 2023-02-14 NOTE — Plan of Care (Signed)
  Problem: Clinical Measurements: Goal: Ability to maintain clinical measurements within normal limits will improve Outcome: Progressing   Problem: Activity: Goal: Risk for activity intolerance will decrease Outcome: Progressing   Problem: Coping: Goal: Level of anxiety will decrease Outcome: Progressing   Problem: Pain Management: Goal: General experience of comfort will improve Outcome: Progressing   Problem: Safety: Goal: Ability to remain free from injury will improve Outcome: Progressing   Problem: Skin Integrity: Goal: Risk for impaired skin integrity will decrease Outcome: Progressing   Problem: Safety: Goal: Ability to remain free from injury will improve Outcome: Progressing   Problem: Nutritional: Goal: Maintenance of adequate nutrition will improve Outcome: Progressing

## 2023-02-14 NOTE — TOC Transition Note (Signed)
Transition of Care [facility]) - CM/SW Discharge Note  Patient Details  Name: [name] MRN: [id number] Date of Birth: December 22, 1950  Transition of [facility]) CM/SW Contact:  Ewing Schlein, LCSW Phone Number: 02/14/2023, 12:48 PM  Clinical Narrative: CSW spoke with Meghan at [facility] and confirmed the facility has an open bed today. The number for report is [phone]. Medical necessity form done; PTAR scheduled. Discharge packet completed. CSW notified son of discharge to residential hospice and transportation being set up. RN updated. TOC signing off.  Final next level of care: Hospice Medical Facility Barriers to Discharge: Barriers Resolved  Patient Goals and CMS Choice CMS Medicare.gov Compare Post Acute Care list provided to:: Patient Represenative (must comment) (Son: Shayvon Vanblaricum and daughter [name]) Choice offered to / list presented to : Adult Children  Discharge Placement      Patient chooses bed at: Other - please specify in the comment section below: [facility])) Patient to be transferred to facility by: PTAR Name of family member notified: Jadamarie Faine (son) Patient and family notified of of transfer: 02/14/23  Discharge Plan and Services Additional resources added to the After Visit Summary for   In-house Referral: NA Discharge Planning Services: CM Consult Post Acute Care Choice: Hospice          DME Arranged: N/A DME Agency: NA HH Arranged: NA HH Agency: NA  Social Determinants of Health (SDOH) Interventions SDOH Screenings   Food Insecurity: No Food Insecurity (02/06/2023)  Housing: Low Risk  (02/06/2023)  Transportation Needs: No Transportation Needs (02/06/2023)  Utilities: Not At Risk (02/06/2023)  Alcohol Screen: Low Risk  (10/30/2019)  Depression (PHQ2-9): Medium Risk (10/30/2019)  Financial Resource Strain: Medium Risk (10/30/2019)  Physical Activity: Inactive (10/30/2019)  Social Connections: Moderately Integrated (10/30/2019)   Stress: No Stress Concern Present (10/30/2019)  Tobacco Use: Low Risk  (02/08/2023)   Readmission Risk Interventions    02/11/2023    3:37 PM 02/03/2023    2:33 PM 01/11/2023    8:51 AM  Readmission Risk Prevention Plan  Transportation Screening Complete Complete Complete  HRI or Home Care Consult  Complete Complete  Social Work Consult for Recovery Care Planning/Counseling  Complete Complete  Palliative Care Screening  Not Applicable Not Applicable  Medication Review Oceanographer) Complete Complete Complete  PCP or Specialist appointment within 3-5 days of discharge Complete    HRI or Home Care Consult Complete    SW Recovery Care/Counseling Consult Complete    Palliative Care Screening Complete    Skilled Nursing Facility Not Applicable

## 2023-02-14 NOTE — Progress Notes (Signed)
Daily Progress Note   Patient Name: [name]       Date: 02/14/2023 DOB: [birth year]  Age: 72 y.o. MRN#: [id number] Attending Physician: Almon Hercules, MD Primary Care Physician: Benita Stabile, MD Admit Date: 02/02/2023  Reason for Consultation/Follow-up: Terminal Care  Subjective: Son at bedside, patient with restlessness and uncontrolled pain overnight and early this a.m.  Medicated with IV opioids.  Currently resting in bed continues to appear weak and with ongoing functional decline.    Length of Stay: 12  Current Medications: Scheduled Meds:   Chlorhexidine Gluconate Cloth  6 each Topical Daily    HYDROmorphone (DILAUDID) injection  0.5 mg Intravenous Once   lactulose  10 g Oral BID   lidocaine  1 patch Transdermal Q24H   nystatin  1 Application Topical TID    Continuous Infusions:   PRN Meds: acetaminophen **OR** acetaminophen, diphenhydrAMINE, fentaNYL (SUBLIMAZE) injection, glycopyrrolate **OR** glycopyrrolate **OR** glycopyrrolate, haloperidol lactate, lidocaine, liver oil-zinc oxide, midazolam, nystatin, ondansetron **OR** ondansetron (ZOFRAN) IV, oxyCODONE, polyvinyl alcohol  Physical Exam         Chronically ill appearing Diminished breath sounds Some peripheral edema Resting in bed  Vital Signs: BP (!) 117/52 (BP Location: Left Arm)   Pulse 70   Temp 98.2 F (36.8 C) (Oral)   Resp 14   Ht 5\' 6"  (1.676 m)   Wt 97.5 kg   SpO2 92%   BMI 34.69 kg/m  SpO2: SpO2: 92 % O2 Device: O2 Device: Room Air O2 Flow Rate: O2 Flow Rate (L/min): 2 L/min  Intake/output summary: No intake or output data in the 24 hours ending 02/14/23 1208  LBM: Last BM Date : 02/11/23 Baseline Weight: Weight: 96.9 kg Most recent weight: Weight: 97.5 kg       Palliative  Assessment/Data:      Patient Active Problem List   Diagnosis Date Noted   Encounter for palliative care 02/12/2023   DNR (do not resuscitate) 02/11/2023   Coagulopathy (HCC) 02/10/2023   Goals of care, counseling/discussion 02/10/2023   DNR (do not resuscitate) discussion 02/10/2023   Hepatorenal syndrome (HCC) 02/07/2023   Poor appetite 02/07/2023   AKI (acute kidney injury) (HCC) 02/02/2023   Anemia due to chronic kidney disease 02/02/2023   UTI (urinary tract infection) 02/02/2023   Hypotension 02/02/2023   Esophageal  varices (HCC) 01/28/2023   Hepatic encephalopathy syndrome (HCC) 01/17/2023   Pressure injury of skin 01/15/2023   SBP (spontaneous bacterial peritonitis) (HCC) 01/14/2023   Encephalopathy, hepatic (HCC) 01/14/2023   Acute hepatic encephalopathy (HCC) 01/10/2023   Ascites 01/10/2023   Anemia in stage 4 chronic kidney disease (HCC) 10/15/2022   Complete prolapse of vaginal vault 09/15/2022   Urinary retention 09/15/2022   Vaginal pessary present 09/15/2022   Elevated brain natriuretic peptide (BNP) level 09/03/2022   Hypoalbuminemia due to protein-calorie malnutrition (HCC) 09/03/2022   Prolonged QT interval 08/25/2022   Vaginal prolapse 08/25/2022   Acute kidney injury superimposed on stage 4 chronic kidney disease (HCC) 08/25/2022   Obesity 06/16/2022   Cirrhosis, non-alcoholic (HCC) 12/04/2020   Vitamin D deficiency 11/26/2020   Mixed hyperlipidemia 11/26/2020   Gout 11/26/2020   History of thrombocytopenia 11/26/2020   Severe Thrombocytopenia 07/30/2017   CKD stage 3 due to type 2 diabetes mellitus (HCC) 06/30/2017   Liver cirrhosis secondary to NASH (HCC) 06/30/2017   Hypertension with renal disease 03/19/2017   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/19/2017   Tremor 08/06/2016   Vitreous hemorrhage of left eye (HCC) 01/29/2015   Diabetic macular edema (HCC) 12/25/2014   Hypertensive retinopathy of both eyes 12/25/2014   Proliferative  diabetic retinopathy (HCC) 08/04/2011   Bilateral nondiabetic proliferative retinopathy 04/03/2011   Type 2 diabetes mellitus with diabetic chronic kidney disease (HCC) 02/05/2010   Acquired hypothyroidism 03/20/2008   RENAL DISEASE, CHRONIC, STAGE II 03/20/2008   Hereditary and idiopathic peripheral neuropathy 02/21/2008   Pseudophakia of both eyes 02/21/2008   GERD 02/21/2008    Palliative Care Assessment & Plan   Patient Profile:    Assessment:  72 year old woman who presented to [facility] as a transfer from [facility] for multisystem organ failure. PMHx significant for HTN, HLD, T2DM, GERD, anemia of chronic disease, chronic thrombocytopenia, hypothyroidism, depression, CKD stage IV, NASH cirrhosis with portal HTN   Recommendations/Plan:  Continue current mode of care, transfer to hospice house when bed available.  Comfort measures discussed with son present at bedside.  End-of-life signs and symptoms discussed with him.  Awaiting hospice bed availability.    Code Status:    Code Status Orders  (From admission, onward)           Start     Ordered   02/11/23 1306  Do not attempt resuscitation (DNR) - Comfort care  Continuous       Question Answer Comment  If patient has no pulse and is not breathing Do Not Attempt Resuscitation   In Pre-Arrest Conditions (Patient Is Breathing and Has a Pulse) Provide comfort measures. Relieve any mechanical airway obstruction. Avoid transfer unless required for comfort.   Consent: Discussion documented in EHR or advanced directives reviewed      02/11/23 1306           Code Status History     Date Active Date Inactive Code Status Order ID Comments User Context   02/11/2023 1045 02/11/2023 1306 Do not attempt resuscitation (DNR) - Comfort care [id number]  Lanier Clam, NP Inpatient   02/02/2023 1731 02/11/2023 1045 Full Code [id number]  Lilyan Gilford, DO ED   01/10/2023 1923 01/19/2023 2107 Do not attempt resuscitation (DNR)  PRE-ARREST INTERVENTIONS DESIRED [id number]  Onnie Boer, MD Inpatient   09/10/2022 1415 09/11/2022 0054 DNR [id number]  Katheran Awe, NP Inpatient   09/03/2022 2119 09/10/2022 1415 Full Code [id number]  Frankey Shown, DO Inpatient  08/25/2022 1628 08/28/2022 1629 Full Code [id number]  Onnie Boer, MD ED   04/05/2013 2233 04/10/2013 2020 Full Code [id number]  Romie Levee, MD Inpatient       Prognosis:  < 2 weeks  Discharge Planning: Hospice facility  Care plan was discussed with patient's son.   Thank you for allowing the Palliative Medicine Team to assist in the care of this patient. Mod MDM     Greater than 50%  of this time was spent counseling and coordinating care related to the above assessment and plan.  Rosalin Hawking, MD  Please contact Palliative Medicine Team phone at [phone] for questions and concerns.

## 2023-02-15 ENCOUNTER — Ambulatory Visit (HOSPITAL_COMMUNITY)
Admission: RE | Admit: 2023-02-15 | Discharge: 2023-02-15 | Disposition: A | Discharge status: Home / Self Care | Payer: Medicare Other | Source: Ambulatory Visit | Attending: Gastroenterology | Admitting: Gastroenterology

## 2023-02-15 ENCOUNTER — Telehealth: Payer: Self-pay | Admitting: *Deleted

## 2023-02-15 NOTE — Progress Notes (Signed)
Patient transferred to  hospice care at [facility] [place]. Per Fredderick Phenix  Care Coordination Care Guide  Direct Dial: [phone]

## 2023-02-15 NOTE — Consult Note (Signed)
[facility] Liaison Consult Note   02/15/2023  SHERIE OLMAN 02/06/1951 [id number]  Update: Pt discharged to [facility] facility.  Liaison will update the VBCI team on pt's discharged disposition. Facility will continue to address pt's needs.  No further services via VBCI needed at this time.  Elliot Cousin, RN, BSN Hospital Liaison [place]   [facility], Population Health Office Hours MTWF  8:00 am-6:00 pm Direct Dial: [phone] mobile [phone] [Office toll free line] Office Hours are M-F 8:30 - 5 pm [email or web address]

## 2023-02-15 NOTE — Progress Notes (Signed)
  Care Coordination Note  02/15/2023 Name: ALYSSANDRA GEORGER MRN: [id number] DOB: Jan 16, 1951  NASHA SERVANTEZ is a 72 y.o. year old female who is a primary care patient of Margo Aye, Kathleene Hazel, MD and is actively engaged with the care management team. I reached out to Val Eagle by phone today to assist with re-scheduling a follow up visit with the RN Case Manager  Follow up plan: Unsuccessful telephone outreach attempt made. A HIPAA compliant phone message was left for the patient providing contact information and requesting a return call.   [facility]  Care Coordination Care Guide  Direct Dial: [phone]

## 2023-02-16 ENCOUNTER — Other Ambulatory Visit (HOSPITAL_COMMUNITY)
Admission: RE | Admit: 2023-02-16 | Discharge: 2023-02-16 | Disposition: A | Discharge status: Home / Self Care | Source: Skilled Nursing Facility | Attending: Internal Medicine | Admitting: Internal Medicine

## 2023-02-16 DIAGNOSIS — K7460 Unspecified cirrhosis of liver: Secondary | ICD-10-CM | POA: Insufficient documentation

## 2023-02-16 LAB — CREATININE, SERUM
Creatinine, Ser: 5.69 mg/dL — ABNORMAL HIGH (ref 0.44–1.00)
GFR, Estimated: 7 mL/min — ABNORMAL LOW (ref >60)

## 2023-02-17 ENCOUNTER — Inpatient Hospital Stay: Payer: Medicare Other

## 2023-02-17 ENCOUNTER — Inpatient Hospital Stay: Payer: Medicare Other | Attending: Hematology

## 2023-02-18 ENCOUNTER — Inpatient Hospital Stay: Payer: Medicare Other

## 2023-02-19 ENCOUNTER — Other Ambulatory Visit (HOSPITAL_COMMUNITY): Payer: Medicare Other

## 2023-02-22 ENCOUNTER — Ambulatory Visit: Payer: Self-pay | Admitting: *Deleted

## 2023-02-22 ENCOUNTER — Ambulatory Visit (HOSPITAL_COMMUNITY): Admission: RE | Admit: 2023-02-22 | Payer: Medicare Other | Source: Ambulatory Visit

## 2023-02-22 NOTE — Patient Outreach (Signed)
  Care Coordination   Case closure/hospice  Visit Note   02/22/2023 Name: [name] MRN: [id number] DOB: 04-Nov-1950  [name] is a 72 y.o. year old female who sees Margo Aye, Kathleene Hazel, MD for primary care. I  Received updated from [facility] & hospital liaison of patient transfer to hospice [facility]  What matters to the patients health and wellness today?  Case closure transfer to Hospice care    Goals Addressed             This Visit's Progress    COMPLETED: [facility] care coordination services       Interventions Today    Flowsheet Row Most Recent Value  Chronic Disease   Chronic disease during today's visit Other  [Received updated from [facility] & hospital liaison of patient transfer to hospice [facility]- case closure]              SDOH assessments and interventions completed:  No     Care Coordination Interventions:  No, not indicated   Follow up plan: No further intervention required.   Encounter Outcome:  Patient Visit Completed   Cala Bradford L. Noelle Penner, RN, BSN, [facility]  VBCI Care Management Coordinator  [phone]  Fax: [phone]

## 2023-02-23 ENCOUNTER — Encounter (HOSPITAL_COMMUNITY): Payer: Self-pay

## 2023-02-23 ENCOUNTER — Encounter (INDEPENDENT_AMBULATORY_CARE_PROVIDER_SITE_OTHER): Payer: Self-pay | Admitting: Gastroenterology

## 2023-02-23 ENCOUNTER — Ambulatory Visit (HOSPITAL_COMMUNITY): Admit: 2023-02-23 | Payer: Medicare Other | Admitting: Gastroenterology

## 2023-02-23 SURGERY — ESOPHAGOGASTRODUODENOSCOPY (EGD) WITH PROPOFOL
Anesthesia: Monitor Anesthesia Care

## 2023-03-01 ENCOUNTER — Ambulatory Visit (HOSPITAL_COMMUNITY): Payer: Medicare Other

## 2023-03-03 ENCOUNTER — Inpatient Hospital Stay: Payer: Medicare Other | Admitting: Physician Assistant

## 2023-03-03 ENCOUNTER — Inpatient Hospital Stay: Payer: Medicare Other

## 2023-03-17 DIAGNOSIS — M85852 Other specified disorders of bone density and structure, left thigh: Secondary | ICD-10-CM | POA: Diagnosis not present

## 2023-03-17 DIAGNOSIS — Z780 Asymptomatic menopausal state: Secondary | ICD-10-CM | POA: Diagnosis not present

## 2023-03-25 ENCOUNTER — Encounter: Payer: Self-pay | Admitting: Hematology

## 2023-03-29 ENCOUNTER — Ambulatory Visit (INDEPENDENT_AMBULATORY_CARE_PROVIDER_SITE_OTHER): Payer: Medicare Other | Admitting: Gastroenterology

## 2023-03-31 DEATH — deceased

## 2023-04-01 ENCOUNTER — Encounter: Payer: Self-pay | Admitting: Hematology

## 2023-04-01 LAB — ACID FAST CULTURE WITH REFLEXED SENSITIVITIES (MYCOBACTERIA): Acid Fast Culture: NEGATIVE

## 2023-04-07 ENCOUNTER — Telehealth (INDEPENDENT_AMBULATORY_CARE_PROVIDER_SITE_OTHER): Payer: Self-pay | Admitting: Gastroenterology

## 2023-04-07 NOTE — Telephone Encounter (Signed)
 Patient is deceased as if 09-Mar-2023

## 2023-04-08 ENCOUNTER — Ambulatory Visit (INDEPENDENT_AMBULATORY_CARE_PROVIDER_SITE_OTHER): Payer: Medicare Other | Admitting: Gastroenterology

## 2023-06-21 ENCOUNTER — Encounter: Payer: Self-pay | Admitting: Hematology

## 2024-01-12 ENCOUNTER — Encounter (INDEPENDENT_AMBULATORY_CARE_PROVIDER_SITE_OTHER): Payer: Self-pay | Admitting: Gastroenterology

## 2024-01-14 ENCOUNTER — Encounter: Payer: Self-pay | Admitting: *Deleted
